# Patient Record
Sex: Female | Born: 1935 | Race: White | Hispanic: No | Smoking: Never smoker
Health system: Southern US, Community
[De-identification: ages and names within clinical notes are randomized; demographics above are authoritative.]

## PROBLEM LIST (undated history)

## (undated) DIAGNOSIS — D649 Anemia, unspecified: Secondary | ICD-10-CM

## (undated) DIAGNOSIS — K589 Irritable bowel syndrome without diarrhea: Secondary | ICD-10-CM

## (undated) DIAGNOSIS — F411 Generalized anxiety disorder: Secondary | ICD-10-CM

## (undated) DIAGNOSIS — T4145XA Adverse effect of unspecified anesthetic, initial encounter: Secondary | ICD-10-CM

## (undated) DIAGNOSIS — M719 Bursopathy, unspecified: Secondary | ICD-10-CM

## (undated) DIAGNOSIS — M1990 Unspecified osteoarthritis, unspecified site: Secondary | ICD-10-CM

## (undated) DIAGNOSIS — C4492 Squamous cell carcinoma of skin, unspecified: Secondary | ICD-10-CM

## (undated) DIAGNOSIS — I739 Peripheral vascular disease, unspecified: Secondary | ICD-10-CM

## (undated) DIAGNOSIS — B0229 Other postherpetic nervous system involvement: Secondary | ICD-10-CM

## (undated) DIAGNOSIS — E785 Hyperlipidemia, unspecified: Secondary | ICD-10-CM

## (undated) DIAGNOSIS — I359 Nonrheumatic aortic valve disorder, unspecified: Secondary | ICD-10-CM

## (undated) DIAGNOSIS — K5730 Diverticulosis of large intestine without perforation or abscess without bleeding: Secondary | ICD-10-CM

## (undated) DIAGNOSIS — E119 Type 2 diabetes mellitus without complications: Secondary | ICD-10-CM

## (undated) DIAGNOSIS — E039 Hypothyroidism, unspecified: Secondary | ICD-10-CM

## (undated) DIAGNOSIS — R001 Bradycardia, unspecified: Secondary | ICD-10-CM

## (undated) DIAGNOSIS — G459 Transient cerebral ischemic attack, unspecified: Secondary | ICD-10-CM

## (undated) DIAGNOSIS — T8859XA Other complications of anesthesia, initial encounter: Secondary | ICD-10-CM

## (undated) DIAGNOSIS — Z780 Asymptomatic menopausal state: Secondary | ICD-10-CM

## (undated) DIAGNOSIS — I8390 Asymptomatic varicose veins of unspecified lower extremity: Secondary | ICD-10-CM

## (undated) DIAGNOSIS — I10 Essential (primary) hypertension: Secondary | ICD-10-CM

## (undated) DIAGNOSIS — H9190 Unspecified hearing loss, unspecified ear: Secondary | ICD-10-CM

## (undated) HISTORY — DX: Essential (primary) hypertension: I10

## (undated) HISTORY — DX: Unspecified osteoarthritis, unspecified site: M19.90

## (undated) HISTORY — DX: Unspecified hearing loss, unspecified ear: H91.90

## (undated) HISTORY — DX: Other postherpetic nervous system involvement: B02.29

## (undated) HISTORY — PX: TONSILLECTOMY: SUR1361

## (undated) HISTORY — DX: Hyperlipidemia, unspecified: E78.5

## (undated) HISTORY — DX: Anemia, unspecified: D64.9

## (undated) HISTORY — DX: Irritable bowel syndrome, unspecified: K58.9

## (undated) HISTORY — PX: CATARACT EXTRACTION, BILATERAL: SHX1313

## (undated) HISTORY — DX: Hypothyroidism, unspecified: E03.9

## (undated) HISTORY — DX: Diverticulosis of large intestine without perforation or abscess without bleeding: K57.30

## (undated) HISTORY — PX: PACEMAKER INSERTION: SHX728

## (undated) HISTORY — DX: Nonrheumatic aortic valve disorder, unspecified: I35.9

## (undated) HISTORY — DX: Bradycardia, unspecified: R00.1

## (undated) HISTORY — PX: VAGINAL HYSTERECTOMY: SUR661

## (undated) HISTORY — PX: APPENDECTOMY: SHX54

## (undated) HISTORY — DX: Generalized anxiety disorder: F41.1

## (undated) HISTORY — PX: CHOLECYSTECTOMY: SHX55

## (undated) HISTORY — PX: DILATION AND CURETTAGE OF UTERUS: SHX78

---
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
## 1997-08-08 ENCOUNTER — Other Ambulatory Visit: Admission: RE | Admit: 1997-08-08 | Discharge: 1997-08-08 | Payer: Self-pay | Admitting: Obstetrics and Gynecology

## 1998-09-20 ENCOUNTER — Other Ambulatory Visit: Admission: RE | Admit: 1998-09-20 | Discharge: 1998-09-20 | Payer: Self-pay | Admitting: Obstetrics and Gynecology

## 1999-05-06 ENCOUNTER — Encounter: Payer: Self-pay | Admitting: Pulmonary Disease

## 1999-05-06 ENCOUNTER — Encounter: Admission: RE | Admit: 1999-05-06 | Discharge: 1999-05-06 | Payer: Self-pay | Admitting: Pulmonary Disease

## 1999-06-25 ENCOUNTER — Encounter: Payer: Self-pay | Admitting: Emergency Medicine

## 1999-06-25 ENCOUNTER — Emergency Department (HOSPITAL_COMMUNITY): Admission: EM | Admit: 1999-06-25 | Discharge: 1999-06-25 | Payer: Self-pay | Admitting: Emergency Medicine

## 1999-09-24 ENCOUNTER — Other Ambulatory Visit: Admission: RE | Admit: 1999-09-24 | Discharge: 1999-09-24 | Payer: Self-pay | Admitting: Obstetrics and Gynecology

## 2000-01-29 ENCOUNTER — Emergency Department (HOSPITAL_COMMUNITY): Admission: EM | Admit: 2000-01-29 | Discharge: 2000-01-29 | Payer: Self-pay | Admitting: Emergency Medicine

## 2000-04-28 ENCOUNTER — Encounter: Payer: Self-pay | Admitting: Neurology

## 2000-04-28 ENCOUNTER — Ambulatory Visit (HOSPITAL_COMMUNITY): Admission: RE | Admit: 2000-04-28 | Discharge: 2000-04-28 | Payer: Self-pay | Admitting: Neurology

## 2000-05-06 ENCOUNTER — Encounter: Admission: RE | Admit: 2000-05-06 | Discharge: 2000-06-04 | Payer: Self-pay | Admitting: Neurology

## 2000-06-15 ENCOUNTER — Ambulatory Visit (HOSPITAL_COMMUNITY): Admission: RE | Admit: 2000-06-15 | Discharge: 2000-06-15 | Payer: Self-pay | Admitting: Pulmonary Disease

## 2000-06-15 ENCOUNTER — Encounter: Payer: Self-pay | Admitting: Pulmonary Disease

## 2001-03-04 ENCOUNTER — Other Ambulatory Visit: Admission: RE | Admit: 2001-03-04 | Discharge: 2001-03-04 | Payer: Self-pay | Admitting: Obstetrics and Gynecology

## 2001-05-11 ENCOUNTER — Encounter: Payer: Self-pay | Admitting: Pulmonary Disease

## 2001-05-11 ENCOUNTER — Encounter: Admission: RE | Admit: 2001-05-11 | Discharge: 2001-05-11 | Payer: Self-pay | Admitting: Pulmonary Disease

## 2003-04-02 ENCOUNTER — Emergency Department (HOSPITAL_COMMUNITY): Admission: EM | Admit: 2003-04-02 | Discharge: 2003-04-02 | Payer: Self-pay | Admitting: Internal Medicine

## 2004-04-09 ENCOUNTER — Ambulatory Visit: Payer: Self-pay | Admitting: Pulmonary Disease

## 2004-04-23 ENCOUNTER — Ambulatory Visit: Payer: Self-pay | Admitting: Pulmonary Disease

## 2004-10-01 ENCOUNTER — Emergency Department (HOSPITAL_COMMUNITY): Admission: EM | Admit: 2004-10-01 | Discharge: 2004-10-01 | Payer: Self-pay | Admitting: Emergency Medicine

## 2005-01-20 ENCOUNTER — Ambulatory Visit: Payer: Self-pay | Admitting: Pulmonary Disease

## 2005-01-21 ENCOUNTER — Ambulatory Visit: Payer: Self-pay | Admitting: Pulmonary Disease

## 2005-02-05 ENCOUNTER — Ambulatory Visit: Payer: Self-pay

## 2005-03-04 ENCOUNTER — Ambulatory Visit: Payer: Self-pay | Admitting: Pulmonary Disease

## 2005-07-02 ENCOUNTER — Ambulatory Visit: Payer: Self-pay | Admitting: Pulmonary Disease

## 2005-07-08 ENCOUNTER — Ambulatory Visit: Payer: Self-pay | Admitting: Internal Medicine

## 2005-10-09 ENCOUNTER — Ambulatory Visit: Payer: Self-pay | Admitting: Pulmonary Disease

## 2005-10-10 ENCOUNTER — Ambulatory Visit: Payer: Self-pay | Admitting: Cardiology

## 2005-12-26 ENCOUNTER — Ambulatory Visit: Payer: Self-pay | Admitting: Pulmonary Disease

## 2006-02-13 ENCOUNTER — Ambulatory Visit: Payer: Self-pay | Admitting: Pulmonary Disease

## 2006-06-24 ENCOUNTER — Encounter: Admission: RE | Admit: 2006-06-24 | Discharge: 2006-06-24 | Payer: Self-pay | Admitting: Pulmonary Disease

## 2006-08-06 ENCOUNTER — Ambulatory Visit: Payer: Self-pay | Admitting: Pulmonary Disease

## 2006-08-06 LAB — CONVERTED CEMR LAB
ALT: 17 units/L (ref 0–40)
AST: 18 units/L (ref 0–37)
Albumin: 3.8 g/dL (ref 3.5–5.2)
Alkaline Phosphatase: 66 units/L (ref 39–117)
Basophils Absolute: 0.1 10*3/uL (ref 0.0–0.1)
Calcium: 9.3 mg/dL (ref 8.4–10.5)
Chloride: 106 meq/L (ref 96–112)
Cholesterol: 171 mg/dL (ref 0–200)
Creatinine, Ser: 0.9 mg/dL (ref 0.4–1.2)
Eosinophils Absolute: 0.3 10*3/uL (ref 0.0–0.6)
GFR calc non Af Amer: 64 mL/min
HCT: 34.1 % — ABNORMAL LOW (ref 36.0–46.0)
LDL Cholesterol: 96 mg/dL (ref 0–99)
MCHC: 33.7 g/dL (ref 30.0–36.0)
MCV: 93.2 fL (ref 78.0–100.0)
Neutrophils Relative %: 41.1 % — ABNORMAL LOW (ref 43.0–77.0)
Platelets: 206 10*3/uL (ref 150–400)
RBC: 3.66 M/uL — ABNORMAL LOW (ref 3.87–5.11)
RDW: 12.3 % (ref 11.5–14.6)
Total Bilirubin: 0.8 mg/dL (ref 0.3–1.2)
Total CHOL/HDL Ratio: 3
Triglycerides: 88 mg/dL (ref 0–149)
WBC: 5.5 10*3/uL (ref 4.5–10.5)

## 2007-01-01 ENCOUNTER — Ambulatory Visit: Payer: Self-pay | Admitting: Pulmonary Disease

## 2007-06-08 ENCOUNTER — Ambulatory Visit: Payer: Self-pay | Admitting: Pulmonary Disease

## 2007-06-08 DIAGNOSIS — H9190 Unspecified hearing loss, unspecified ear: Secondary | ICD-10-CM | POA: Insufficient documentation

## 2007-06-08 DIAGNOSIS — F411 Generalized anxiety disorder: Secondary | ICD-10-CM | POA: Insufficient documentation

## 2007-06-08 DIAGNOSIS — M810 Age-related osteoporosis without current pathological fracture: Secondary | ICD-10-CM | POA: Insufficient documentation

## 2007-06-08 DIAGNOSIS — G459 Transient cerebral ischemic attack, unspecified: Secondary | ICD-10-CM | POA: Insufficient documentation

## 2007-06-08 DIAGNOSIS — J42 Unspecified chronic bronchitis: Secondary | ICD-10-CM | POA: Insufficient documentation

## 2007-06-08 DIAGNOSIS — G609 Hereditary and idiopathic neuropathy, unspecified: Secondary | ICD-10-CM | POA: Insufficient documentation

## 2007-06-08 DIAGNOSIS — K5730 Diverticulosis of large intestine without perforation or abscess without bleeding: Secondary | ICD-10-CM | POA: Insufficient documentation

## 2007-06-08 DIAGNOSIS — I10 Essential (primary) hypertension: Secondary | ICD-10-CM | POA: Insufficient documentation

## 2007-06-08 DIAGNOSIS — E039 Hypothyroidism, unspecified: Secondary | ICD-10-CM | POA: Insufficient documentation

## 2007-06-08 DIAGNOSIS — E785 Hyperlipidemia, unspecified: Secondary | ICD-10-CM | POA: Insufficient documentation

## 2007-06-11 LAB — CONVERTED CEMR LAB
Alkaline Phosphatase: 58 units/L (ref 39–117)
BUN: 25 mg/dL — ABNORMAL HIGH (ref 6–23)
Basophils Absolute: 0.1 10*3/uL (ref 0.0–0.1)
Basophils Relative: 1.4 % — ABNORMAL HIGH (ref 0.0–1.0)
CO2: 31 meq/L (ref 19–32)
Eosinophils Absolute: 0.6 10*3/uL (ref 0.0–0.6)
GFR calc Af Amer: 68 mL/min
GFR calc non Af Amer: 56 mL/min
Hemoglobin: 11.3 g/dL — ABNORMAL LOW (ref 12.0–15.0)
Hgb A1c MFr Bld: 6.7 % — ABNORMAL HIGH (ref 4.6–6.0)
Lymphocytes Relative: 33.6 % (ref 12.0–46.0)
MCHC: 32.8 g/dL (ref 30.0–36.0)
MCV: 93.3 fL (ref 78.0–100.0)
Monocytes Absolute: 0.5 10*3/uL (ref 0.2–0.7)
Monocytes Relative: 7.6 % (ref 3.0–11.0)
Neutro Abs: 3.1 10*3/uL (ref 1.4–7.7)
Platelets: 201 10*3/uL (ref 150–400)
Potassium: 3.7 meq/L (ref 3.5–5.1)

## 2007-06-13 ENCOUNTER — Emergency Department (HOSPITAL_COMMUNITY): Admission: EM | Admit: 2007-06-13 | Discharge: 2007-06-13 | Payer: Self-pay | Admitting: Emergency Medicine

## 2007-06-16 ENCOUNTER — Ambulatory Visit: Payer: Self-pay | Admitting: Pulmonary Disease

## 2007-06-16 DIAGNOSIS — B029 Zoster without complications: Secondary | ICD-10-CM | POA: Insufficient documentation

## 2007-06-18 ENCOUNTER — Telehealth (INDEPENDENT_AMBULATORY_CARE_PROVIDER_SITE_OTHER): Payer: Self-pay | Admitting: *Deleted

## 2007-06-30 ENCOUNTER — Ambulatory Visit: Payer: Self-pay | Admitting: Pulmonary Disease

## 2007-07-01 ENCOUNTER — Telehealth (INDEPENDENT_AMBULATORY_CARE_PROVIDER_SITE_OTHER): Payer: Self-pay | Admitting: *Deleted

## 2007-07-01 DIAGNOSIS — B0229 Other postherpetic nervous system involvement: Secondary | ICD-10-CM | POA: Insufficient documentation

## 2007-07-08 ENCOUNTER — Ambulatory Visit: Payer: Self-pay | Admitting: Pulmonary Disease

## 2007-07-08 DIAGNOSIS — K2990 Gastroduodenitis, unspecified, without bleeding: Secondary | ICD-10-CM

## 2007-07-08 DIAGNOSIS — K2970 Gastritis, unspecified, without bleeding: Secondary | ICD-10-CM | POA: Insufficient documentation

## 2007-08-10 ENCOUNTER — Ambulatory Visit: Payer: Self-pay | Admitting: Internal Medicine

## 2007-08-16 ENCOUNTER — Encounter: Payer: Self-pay | Admitting: Adult Health

## 2007-09-08 ENCOUNTER — Encounter: Payer: Self-pay | Admitting: Adult Health

## 2007-09-08 ENCOUNTER — Ambulatory Visit: Payer: Self-pay | Admitting: Internal Medicine

## 2007-09-17 ENCOUNTER — Telehealth: Payer: Self-pay | Admitting: Adult Health

## 2007-10-06 ENCOUNTER — Ambulatory Visit: Payer: Self-pay | Admitting: Pulmonary Disease

## 2007-10-07 ENCOUNTER — Ambulatory Visit: Payer: Self-pay | Admitting: Pulmonary Disease

## 2007-10-20 ENCOUNTER — Telehealth: Payer: Self-pay | Admitting: Pulmonary Disease

## 2007-11-03 ENCOUNTER — Telehealth: Payer: Self-pay | Admitting: Pulmonary Disease

## 2007-11-05 LAB — CONVERTED CEMR LAB
Calcium: 9.5 mg/dL (ref 8.4–10.5)
Direct LDL: 143 mg/dL
GFR calc Af Amer: 77 mL/min
GFR calc non Af Amer: 64 mL/min
Glucose, Bld: 97 mg/dL (ref 70–99)
HDL: 57.8 mg/dL (ref >39.0)
Hgb A1c MFr Bld: 6.6 % — ABNORMAL HIGH (ref 4.6–6.0)
Potassium: 4.5 meq/L (ref 3.5–5.1)
Sodium: 137 meq/L (ref 135–145)
TSH: 6.2 microintl units/mL — ABNORMAL HIGH (ref 0.35–5.50)
Total CHOL/HDL Ratio: 3.8

## 2007-11-06 DIAGNOSIS — R7309 Other abnormal glucose: Secondary | ICD-10-CM | POA: Insufficient documentation

## 2007-11-29 ENCOUNTER — Encounter: Payer: Self-pay | Admitting: Pulmonary Disease

## 2007-12-28 ENCOUNTER — Ambulatory Visit: Payer: Self-pay | Admitting: Internal Medicine

## 2007-12-28 DIAGNOSIS — K589 Irritable bowel syndrome without diarrhea: Secondary | ICD-10-CM | POA: Insufficient documentation

## 2007-12-29 ENCOUNTER — Encounter: Payer: Self-pay | Admitting: Adult Health

## 2007-12-29 ENCOUNTER — Encounter: Payer: Self-pay | Admitting: Pulmonary Disease

## 2007-12-31 LAB — CONVERTED CEMR LAB
ALT: 17 units/L (ref 0–35)
Basophils Absolute: 0.1 10*3/uL (ref 0.0–0.1)
Bilirubin Urine: NEGATIVE
Bilirubin, Direct: 0.1 mg/dL (ref 0.0–0.3)
CO2: 31 meq/L (ref 19–32)
Calcium: 9.6 mg/dL (ref 8.4–10.5)
Chloride: 101 meq/L (ref 96–112)
Creatinine, Ser: 0.8 mg/dL (ref 0.4–1.2)
GFR calc non Af Amer: 73 mL/min
Hemoglobin: 11.8 g/dL — ABNORMAL LOW (ref 12.0–15.0)
Lymphocytes Relative: 36 % (ref 12.0–46.0)
MCHC: 34.2 g/dL (ref 30.0–36.0)
Monocytes Relative: 7.4 % (ref 3.0–12.0)
Mucus, UA: NEGATIVE
Neutrophils Relative %: 45 % (ref 43.0–77.0)
Nitrite: NEGATIVE
Platelets: 222 10*3/uL (ref 150–400)
RDW: 12.2 % (ref 11.5–14.6)
Sodium: 136 meq/L (ref 135–145)
Specific Gravity, Urine: 1.01 (ref 1.000–1.03)
Squamous Epithelial / LPF: NEGATIVE /lpf
Total Bilirubin: 0.8 mg/dL (ref 0.3–1.2)
Total Protein, Urine: NEGATIVE mg/dL
pH: 7 (ref 5.0–8.0)

## 2008-01-11 ENCOUNTER — Ambulatory Visit: Payer: Self-pay | Admitting: Pulmonary Disease

## 2008-01-14 ENCOUNTER — Ambulatory Visit: Payer: Self-pay | Admitting: Internal Medicine

## 2008-01-14 LAB — CONVERTED CEMR LAB
Mucus, UA: NEGATIVE
Nitrite: NEGATIVE
RBC / HPF: NONE SEEN
Total Protein, Urine: NEGATIVE mg/dL
Urine Glucose: NEGATIVE mg/dL
WBC, UA: NONE SEEN cells/hpf
pH: 6 (ref 5.0–8.0)

## 2008-01-15 ENCOUNTER — Encounter: Payer: Self-pay | Admitting: Adult Health

## 2008-02-08 ENCOUNTER — Ambulatory Visit: Payer: Self-pay | Admitting: Pulmonary Disease

## 2008-02-19 DIAGNOSIS — M1990 Unspecified osteoarthritis, unspecified site: Secondary | ICD-10-CM | POA: Insufficient documentation

## 2008-02-19 DIAGNOSIS — Z9189 Other specified personal risk factors, not elsewhere classified: Secondary | ICD-10-CM | POA: Insufficient documentation

## 2008-03-15 ENCOUNTER — Telehealth (INDEPENDENT_AMBULATORY_CARE_PROVIDER_SITE_OTHER): Payer: Self-pay | Admitting: *Deleted

## 2008-04-12 ENCOUNTER — Ambulatory Visit: Payer: Self-pay | Admitting: Adult Health

## 2008-04-13 ENCOUNTER — Telehealth (INDEPENDENT_AMBULATORY_CARE_PROVIDER_SITE_OTHER): Payer: Self-pay | Admitting: *Deleted

## 2008-04-17 LAB — CONVERTED CEMR LAB
ALT: 13 units/L (ref 0–35)
Albumin: 3.6 g/dL (ref 3.5–5.2)
Basophils Relative: 1.5 % (ref 0.0–3.0)
Bilirubin, Direct: 0.1 mg/dL (ref 0.0–0.3)
Eosinophils Relative: 4.2 % (ref 0.0–5.0)
HCT: 34 % — ABNORMAL LOW (ref 36.0–46.0)
Hemoglobin: 11.4 g/dL — ABNORMAL LOW (ref 12.0–15.0)
MCV: 94.6 fL (ref 78.0–100.0)
Monocytes Absolute: 0.6 10*3/uL (ref 0.1–1.0)
Monocytes Relative: 12.3 % — ABNORMAL HIGH (ref 3.0–12.0)
Neutro Abs: 2.7 10*3/uL (ref 1.4–7.7)
Platelets: 187 10*3/uL (ref 150–400)
RBC: 3.59 M/uL — ABNORMAL LOW (ref 3.87–5.11)
Sed Rate: 22 mm/hr (ref 0–22)
Total Protein: 6.5 g/dL (ref 6.0–8.3)
WBC: 4.8 10*3/uL (ref 4.5–10.5)

## 2008-04-25 ENCOUNTER — Ambulatory Visit: Payer: Self-pay | Admitting: Pulmonary Disease

## 2008-04-25 ENCOUNTER — Ambulatory Visit: Payer: Self-pay | Admitting: Internal Medicine

## 2008-06-21 ENCOUNTER — Ambulatory Visit: Payer: Self-pay | Admitting: Pulmonary Disease

## 2008-06-21 DIAGNOSIS — D649 Anemia, unspecified: Secondary | ICD-10-CM | POA: Insufficient documentation

## 2008-06-24 LAB — CONVERTED CEMR LAB
Alkaline Phosphatase: 71 units/L (ref 39–117)
Bilirubin, Direct: 0.1 mg/dL (ref 0.0–0.3)
Eosinophils Absolute: 0.4 10*3/uL (ref 0.0–0.7)
GFR calc Af Amer: 123 mL/min
GFR calc non Af Amer: 101 mL/min
Glucose, Bld: 129 mg/dL — ABNORMAL HIGH (ref 70–99)
HCT: 33.6 % — ABNORMAL LOW (ref 36.0–46.0)
Hgb A1c MFr Bld: 6.6 % — ABNORMAL HIGH (ref 4.6–6.0)
Monocytes Absolute: 0.4 10*3/uL (ref 0.1–1.0)
Monocytes Relative: 7.4 % (ref 3.0–12.0)
Neutrophils Relative %: 45 % (ref 43.0–77.0)
Platelets: 198 10*3/uL (ref 150–400)
Potassium: 4.3 meq/L (ref 3.5–5.1)
RDW: 12.1 % (ref 11.5–14.6)
Saturation Ratios: 20.3 % (ref 20.0–50.0)
Sodium: 137 meq/L (ref 135–145)
TSH: 0.41 microintl units/mL (ref 0.35–5.50)

## 2008-07-10 ENCOUNTER — Telehealth (INDEPENDENT_AMBULATORY_CARE_PROVIDER_SITE_OTHER): Payer: Self-pay | Admitting: *Deleted

## 2008-08-24 ENCOUNTER — Encounter: Payer: Self-pay | Admitting: Adult Health

## 2008-08-24 ENCOUNTER — Ambulatory Visit: Payer: Self-pay | Admitting: Pulmonary Disease

## 2008-08-24 DIAGNOSIS — M549 Dorsalgia, unspecified: Secondary | ICD-10-CM | POA: Insufficient documentation

## 2008-08-24 LAB — CONVERTED CEMR LAB
Hemoglobin, Urine: NEGATIVE
Nitrite: POSITIVE
Rhuematoid fact SerPl-aCnc: <20 intl units/mL (ref 0.0–20.0)
Urobilinogen, UA: 0.2 (ref 0.0–1.0)

## 2008-08-25 ENCOUNTER — Encounter: Payer: Self-pay | Admitting: Adult Health

## 2008-08-25 ENCOUNTER — Telehealth: Payer: Self-pay | Admitting: Adult Health

## 2008-09-19 ENCOUNTER — Encounter: Payer: Self-pay | Admitting: Pulmonary Disease

## 2008-09-27 ENCOUNTER — Telehealth (INDEPENDENT_AMBULATORY_CARE_PROVIDER_SITE_OTHER): Payer: Self-pay | Admitting: *Deleted

## 2008-09-27 ENCOUNTER — Encounter
Admission: RE | Admit: 2008-09-27 | Discharge: 2008-09-27 | Payer: Self-pay | Admitting: Physical Medicine and Rehabilitation

## 2008-09-27 ENCOUNTER — Ambulatory Visit: Payer: Self-pay | Admitting: Pulmonary Disease

## 2008-09-27 LAB — CONVERTED CEMR LAB
Hemoglobin, Urine: NEGATIVE
Nitrite: NEGATIVE
Total Protein, Urine: NEGATIVE mg/dL
pH: 5.5 (ref 5.0–8.0)

## 2008-10-03 ENCOUNTER — Encounter: Payer: Self-pay | Admitting: Pulmonary Disease

## 2008-10-04 ENCOUNTER — Telehealth (INDEPENDENT_AMBULATORY_CARE_PROVIDER_SITE_OTHER): Payer: Self-pay | Admitting: *Deleted

## 2008-11-01 ENCOUNTER — Encounter: Payer: Self-pay | Admitting: Pulmonary Disease

## 2008-11-28 ENCOUNTER — Encounter: Payer: Self-pay | Admitting: Pulmonary Disease

## 2008-11-29 ENCOUNTER — Ambulatory Visit: Payer: Self-pay | Admitting: Pulmonary Disease

## 2008-11-29 LAB — CONVERTED CEMR LAB
Bilirubin Urine: NEGATIVE
Ketones, ur: NEGATIVE mg/dL
Nitrite: POSITIVE
Total Protein, Urine: NEGATIVE mg/dL

## 2008-12-21 ENCOUNTER — Telehealth: Payer: Self-pay | Admitting: Pulmonary Disease

## 2009-01-04 ENCOUNTER — Encounter: Payer: Self-pay | Admitting: Adult Health

## 2009-01-04 ENCOUNTER — Ambulatory Visit: Payer: Self-pay | Admitting: Internal Medicine

## 2009-01-05 LAB — CONVERTED CEMR LAB
ALT: 17 units/L (ref 0–35)
Albumin: 3.9 g/dL (ref 3.5–5.2)
Basophils Relative: 1.7 % (ref 0.0–3.0)
CO2: 31 meq/L (ref 19–32)
Chloride: 98 meq/L (ref 96–112)
Creatinine, Ser: 0.8 mg/dL (ref 0.4–1.2)
Eosinophils Absolute: 0.2 10*3/uL (ref 0.0–0.7)
Eosinophils Relative: 3.5 % (ref 0.0–5.0)
HCT: 34.4 % — ABNORMAL LOW (ref 36.0–46.0)
Hemoglobin: 11.7 g/dL — ABNORMAL LOW (ref 12.0–15.0)
Ketones, ur: NEGATIVE mg/dL
Lymphs Abs: 1.8 10*3/uL (ref 0.7–4.0)
MCHC: 34 g/dL (ref 30.0–36.0)
MCV: 92.8 fL (ref 78.0–100.0)
Monocytes Absolute: 0.5 10*3/uL (ref 0.1–1.0)
Neutro Abs: 3.5 10*3/uL (ref 1.4–7.7)
Potassium: 4.5 meq/L (ref 3.5–5.1)
RBC: 3.71 M/uL — ABNORMAL LOW (ref 3.87–5.11)
Sodium: 135 meq/L (ref 135–145)
Specific Gravity, Urine: <=1.005 (ref 1.000–1.030)
Total Protein: 6.6 g/dL (ref 6.0–8.3)
Urobilinogen, UA: 0.2 (ref 0.0–1.0)

## 2009-01-11 ENCOUNTER — Telehealth: Payer: Self-pay | Admitting: Adult Health

## 2009-02-01 ENCOUNTER — Ambulatory Visit: Payer: Self-pay | Admitting: Pulmonary Disease

## 2009-02-01 DIAGNOSIS — M79609 Pain in unspecified limb: Secondary | ICD-10-CM | POA: Insufficient documentation

## 2009-02-06 ENCOUNTER — Encounter: Payer: Self-pay | Admitting: Pulmonary Disease

## 2009-02-20 ENCOUNTER — Encounter: Payer: Self-pay | Admitting: Pulmonary Disease

## 2009-04-20 ENCOUNTER — Telehealth: Payer: Self-pay | Admitting: Pulmonary Disease

## 2009-06-08 ENCOUNTER — Ambulatory Visit: Payer: Self-pay | Admitting: Pulmonary Disease

## 2009-06-12 DIAGNOSIS — R252 Cramp and spasm: Secondary | ICD-10-CM | POA: Insufficient documentation

## 2009-06-12 LAB — CONVERTED CEMR LAB
BUN: 20 mg/dL (ref 6–23)
CO2: 29 meq/L (ref 19–32)
GFR calc non Af Amer: 84.63 mL/min (ref >60)
Glucose, Bld: 93 mg/dL (ref 70–99)
Hgb A1c MFr Bld: 6.3 % (ref 4.6–6.5)
Potassium: 4.8 meq/L (ref 3.5–5.1)

## 2009-06-28 ENCOUNTER — Ambulatory Visit: Payer: Self-pay | Admitting: Pulmonary Disease

## 2009-06-28 LAB — CONVERTED CEMR LAB
CO2: 31 meq/L (ref 19–32)
Calcium: 8.8 mg/dL (ref 8.4–10.5)
Chloride: 101 meq/L (ref 96–112)
Glucose, Bld: 141 mg/dL — ABNORMAL HIGH (ref 70–99)
Sodium: 138 meq/L (ref 135–145)

## 2009-07-01 DIAGNOSIS — E871 Hypo-osmolality and hyponatremia: Secondary | ICD-10-CM | POA: Insufficient documentation

## 2009-07-31 ENCOUNTER — Ambulatory Visit: Payer: Self-pay | Admitting: Pulmonary Disease

## 2009-07-31 DIAGNOSIS — I359 Nonrheumatic aortic valve disorder, unspecified: Secondary | ICD-10-CM | POA: Insufficient documentation

## 2009-07-31 DIAGNOSIS — I872 Venous insufficiency (chronic) (peripheral): Secondary | ICD-10-CM | POA: Insufficient documentation

## 2009-08-14 ENCOUNTER — Ambulatory Visit: Payer: Self-pay | Admitting: Pulmonary Disease

## 2009-08-19 LAB — CONVERTED CEMR LAB
Alkaline Phosphatase: 60 units/L (ref 39–117)
BUN: 16 mg/dL (ref 6–23)
Basophils Absolute: 0.1 10*3/uL (ref 0.0–0.1)
Basophils Relative: 1.5 % (ref 0.0–3.0)
Bilirubin, Direct: 0.1 mg/dL (ref 0.0–0.3)
CO2: 32 meq/L (ref 19–32)
Chloride: 105 meq/L (ref 96–112)
Cholesterol: 176 mg/dL (ref 0–200)
Creatinine, Ser: 0.7 mg/dL (ref 0.4–1.2)
Eosinophils Absolute: 0.3 10*3/uL (ref 0.0–0.7)
Glucose, Bld: 96 mg/dL (ref 70–99)
HCT: 33.3 % — ABNORMAL LOW (ref 36.0–46.0)
Hemoglobin: 11.4 g/dL — ABNORMAL LOW (ref 12.0–15.0)
LDL Cholesterol: 95 mg/dL (ref 0–99)
Lymphocytes Relative: 37.9 % (ref 12.0–46.0)
Lymphs Abs: 2.2 10*3/uL (ref 0.7–4.0)
MCHC: 34.4 g/dL (ref 30.0–36.0)
MCV: 94.8 fL (ref 78.0–100.0)
Neutro Abs: 2.8 10*3/uL (ref 1.4–7.7)
Potassium: 5.4 meq/L — ABNORMAL HIGH (ref 3.5–5.1)
RBC: 3.51 M/uL — ABNORMAL LOW (ref 3.87–5.11)
RDW: 13.6 % (ref 11.5–14.6)
Total Bilirubin: 0.6 mg/dL (ref 0.3–1.2)
Total CHOL/HDL Ratio: 3
Total Protein: 5.7 g/dL — ABNORMAL LOW (ref 6.0–8.3)
VLDL: 16.2 mg/dL (ref 0.0–40.0)

## 2009-08-21 ENCOUNTER — Ambulatory Visit (HOSPITAL_COMMUNITY): Admission: RE | Admit: 2009-08-21 | Discharge: 2009-08-21 | Payer: Self-pay | Admitting: Pulmonary Disease

## 2009-08-21 ENCOUNTER — Encounter: Payer: Self-pay | Admitting: Pulmonary Disease

## 2009-08-21 ENCOUNTER — Ambulatory Visit: Payer: Self-pay

## 2009-08-21 ENCOUNTER — Ambulatory Visit: Payer: Self-pay | Admitting: Cardiology

## 2009-08-24 ENCOUNTER — Telehealth (INDEPENDENT_AMBULATORY_CARE_PROVIDER_SITE_OTHER): Payer: Self-pay | Admitting: *Deleted

## 2009-09-04 ENCOUNTER — Encounter: Payer: Self-pay | Admitting: Pulmonary Disease

## 2009-10-01 ENCOUNTER — Telehealth: Payer: Self-pay | Admitting: Pulmonary Disease

## 2010-01-01 ENCOUNTER — Ambulatory Visit: Payer: Self-pay | Admitting: Internal Medicine

## 2010-01-01 ENCOUNTER — Encounter: Payer: Self-pay | Admitting: Adult Health

## 2010-01-07 ENCOUNTER — Telehealth (INDEPENDENT_AMBULATORY_CARE_PROVIDER_SITE_OTHER): Payer: Self-pay | Admitting: *Deleted

## 2010-01-29 ENCOUNTER — Ambulatory Visit: Payer: Self-pay | Admitting: Pulmonary Disease

## 2010-01-29 ENCOUNTER — Encounter: Payer: Self-pay | Admitting: Adult Health

## 2010-01-29 DIAGNOSIS — R300 Dysuria: Secondary | ICD-10-CM | POA: Insufficient documentation

## 2010-01-29 LAB — CONVERTED CEMR LAB
BUN: 14 mg/dL (ref 6–23)
CO2: 31 meq/L (ref 19–32)
Calcium: 9.4 mg/dL (ref 8.4–10.5)
Glucose, Bld: 95 mg/dL (ref 70–99)
Ketones, ur: NEGATIVE mg/dL
Potassium: 5.2 meq/L — ABNORMAL HIGH (ref 3.5–5.1)
Sodium: 132 meq/L — ABNORMAL LOW (ref 135–145)
Specific Gravity, Urine: 1.01 (ref 1.000–1.030)
Total Protein, Urine: NEGATIVE mg/dL
Urine Glucose: NEGATIVE mg/dL

## 2010-02-07 ENCOUNTER — Inpatient Hospital Stay (HOSPITAL_COMMUNITY): Admission: EM | Admit: 2010-02-07 | Discharge: 2010-02-12 | Payer: Self-pay | Admitting: Emergency Medicine

## 2010-02-07 ENCOUNTER — Telehealth: Payer: Self-pay | Admitting: Pulmonary Disease

## 2010-02-07 ENCOUNTER — Ambulatory Visit: Payer: Self-pay | Admitting: Cardiology

## 2010-02-11 ENCOUNTER — Ambulatory Visit: Payer: Self-pay | Admitting: Cardiology

## 2010-02-12 ENCOUNTER — Encounter: Payer: Self-pay | Admitting: Cardiology

## 2010-02-14 ENCOUNTER — Encounter: Payer: Self-pay | Admitting: Internal Medicine

## 2010-02-20 ENCOUNTER — Encounter: Payer: Self-pay | Admitting: Internal Medicine

## 2010-02-20 ENCOUNTER — Ambulatory Visit: Payer: Self-pay

## 2010-02-21 ENCOUNTER — Telehealth (INDEPENDENT_AMBULATORY_CARE_PROVIDER_SITE_OTHER): Payer: Self-pay | Admitting: *Deleted

## 2010-03-12 ENCOUNTER — Encounter: Payer: Self-pay | Admitting: Pulmonary Disease

## 2010-03-14 ENCOUNTER — Encounter: Payer: Self-pay | Admitting: Internal Medicine

## 2010-03-14 ENCOUNTER — Ambulatory Visit: Payer: Self-pay | Admitting: Cardiology

## 2010-03-14 ENCOUNTER — Encounter: Admission: RE | Admit: 2010-03-14 | Discharge: 2010-03-14 | Payer: Self-pay | Admitting: Cardiology

## 2010-04-17 ENCOUNTER — Telehealth: Payer: Self-pay | Admitting: Internal Medicine

## 2010-05-01 ENCOUNTER — Telehealth (INDEPENDENT_AMBULATORY_CARE_PROVIDER_SITE_OTHER): Payer: Self-pay | Admitting: *Deleted

## 2010-05-16 NOTE — Progress Notes (Signed)
Summary: checking on status of refills to [facility]  Phone Note Call from Patient Call back at [facility] Phone [phone]   Caller: Patient Call For: nadel Reason for Call: Refill Medication Summary of Call: Pt states that [facility] hasn't received refills for her lyrica and synthroid, pls advise. Initial call taken by: Darletta Moll,  January 07, 2010 10:54 AM  Follow-up for Phone Call        called and spoke with Randa Evens at [facility] and verified Lyrica and Synthroid were both shipped out 01/04/2010 and should arrive in pt's mailbox soon.  called and spoke with pt and informed her of the above information.  pt verbalized her understanding. nothing further needed.  Aundra Millet Reynolds LPN  January 07, 2010 4:28 PM

## 2010-05-16 NOTE — Progress Notes (Signed)
Summary: question about a pacer phone check  Phone Note Call from Patient Call back at Home Phone [phone]   Caller: Patient Summary of Call: Question about pacemaker Initial call taken by: Judie Grieve,  April 17, 2010 2:02 PM  Follow-up for Phone Call        spoke w/pt and pt was wondering about receiving carelink transmitter.  pt enrolled in carelink and transmitter to be sent in next 2-3 weeks.  pt aware of receiving transmitter and also ov w/ja on 06-06-10. Vella Kohler  April 18, 2010 8:42 AM

## 2010-05-16 NOTE — Progress Notes (Signed)
Summary: pt need to talk about buzzing noise  Phone Note Call from Patient Call back at Home Phone [phone]   Caller: Patient Reason for Call: Talk to Nurse, Talk to Doctor Summary of Call: pt wants to talk about buzzing she hears from pacemaker Initial call taken by: Omer Jack,  February 21, 2010 9:03 AM  Follow-up for Phone Call        Patient reassured about the noise  on her atrial lead.  We will follow [place] as scheduled. Follow-up by: Altha Harm, LPN,  February 22, 2010 10:25 AM

## 2010-05-16 NOTE — Progress Notes (Signed)
Summary: extremely dizzy  Phone Note Call from Patient Call back at Home Phone [phone]   Caller: Patient Call For: nadel Summary of Call: pt c/o dizziness- feels like she could pass out any minute. she is alone and wants to be seen. says her sone will be at her home at 3pm today. i advised pt that she needs to call 911 if she feels she can not wait for her son or a call back- NOT to wait, but it doesn't sound like she will call ems.  Initial call taken by: Tivis Ringer, CNA,  February 07, 2010 12:57 PM  Follow-up for Phone Call        attempted to call pt x 2 but the line was busy--will try back Randell Loop [facility]  February 07, 2010 1:07 PM     called and spoke with pt and she stated that EMS is there and that she is going to the ER for eval---stated that her BP was fine. Randell Loop CMA  February 07, 2010 1:14 PM

## 2010-05-16 NOTE — Procedures (Signed)
Summary: wound check.[email or web address]   Current Medications (verified): 1)  Aspirin 325 Mg Tabs (Aspirin) .... Take One By Mouth Once Daily 2)  Diovan Hct 80-12.5 Mg  Tabs (Valsartan-Hydrochlorothiazide) .... Take 1 Tablet By Mouth Once A Day 3)  Simvastatin 40 Mg Tabs (Simvastatin) .... Take 1 Tab By Mouth At Bedtime 4)  Fish Oil 1000 Mg Caps (Omega-3 Fatty Acids) .... Take 1 Tablet By Mouth Once A Day 5)  Coq10 100 Mg  Caps (Coenzyme Q10) .... Take 1 Capsule By Mouth Once A Day 6)  Synthroid 125 Mcg Tabs (Levothyroxine Sodium) .... Take 1 Tab By Mouth Once Daily.Marland KitchenMarland Kitchen 7)  Miralax  Powd (Polyethylene Glycol 3350) .... As Needed 8)  Stool Softener 100 Mg Caps (Docusate Sodium) .... Per Bottle 9)  Probiotic  Caps (Misc Intestinal Flora Regulat) .... Take 1 Tablet By Mouth Once A Day 10)  Alendronate Sodium 70 Mg Tabs (Alendronate Sodium) .... Take One Tablet By Mouth Once Weekly 11)  Caltrate 600+d 600-400 Mg-Unit  Tabs (Calcium Carbonate-Vitamin D) .[name].. 1 Tab By Mouth Once Daily 12)  Vitamin B-12 500 Mcg  Tabs (Cyanocobalamin) .... Take 1 Tablet By Mouth Once A Day 13)  Vitamin D 1000 Unit  Tabs (Cholecalciferol) .... Take 1 Tablet By Mouth Once A Day 14)  Alpha-Lipoic Acid 200 Mg Caps (Alpha-Lipoic Acid) .... Take 1 Tablet By Mouth Once A Day 15)  Onetouch Ultra Test  Strp (Glucose Blood) .... As Directed To Test Glucose  Allergies (verified): 1)  ! Penicillin 2)  ! Sulfa 3)  ! Xanax  PPM Specifications Following MD:  Hillis Range, MD     Referring MD:  Rolla Plate PPM Vendor:  [employer]     PPM Model Number:  [id number]     PPM Serial Number:  [id number] PPM DOI:  02/11/2010     PPM Implanting MD:  Rolla Plate  Lead 1    Location: RA     DOI: 02/11/2010     Model #: [id number]     Serial #: [id number]     Status: active Lead 2    Location: RV     DOI: 02/11/2010     Model #: 4470     Serial #: [id number]     Status: active  Magnet Response Rate:  BOL 85 ERI 65  Indications:  Syncope   PPM  Follow Up Remote Check?  No Battery Voltage:  2.79 V     Battery Est. Longevity:  12.5 years     Pacer Dependent:  No       PPM Device Measurements Atrium  Amplitude: 2.8 mV, Impedance: 558 ohms, Threshold: 0.5 V at 0.4 msec Right Ventricle  Amplitude: 11.2 mV, Impedance: 690 ohms, Threshold: 0.5 V at 0.4 msec  Episodes MS Episodes:  0     Percent Mode Switch:  0     Coumadin:  No Ventricular High Rate:  0     Atrial Pacing:  11.4%     Ventricular Pacing:  20.5%  Parameters Mode:  DDD     Lower Rate Limit:  60     Upper Rate Limit:  130 Paced AV Delay:  180     Sensed AV Delay:  150 Next Cardiology Appt Due:  05/15/2010 Tech Comments:  Steri strips removed, no redness, but resolving bruising noted into the right breast area.  There is noise noted on the atrial lead but her impedance, threshold and p-waves are stable, Dr. Johney Frame is aware.  ROV 3  months with Dr. Johney Frame.  Altha Harm, LPN  February 20, 2010 12:18 PM   Appended Document: wound check.mdt.amber Though there is significant artifact on the atrial lead, function appears to be stable.  Given advanced age, I would follow conservatively.  I have discussed with Dr Deborah Chalk who plans to see the patient in the next few weeks also.

## 2010-05-16 NOTE — Progress Notes (Signed)
Summary: test results  Phone Note Call from Patient Call back at Home Phone [phone]   Caller: Patient Call For: nadel Reason for Call: Lab or Test Results Summary of Call: Wants results of echo. Initial call taken by: Darletta Moll,  Aug 24, 2009 2:54 PM  Follow-up for Phone Call        pt requesting resutls of echo, append states results on phone tree, however when I checked it states results not available. Please adivse. [name] CMA  Aug 24, 2009 2:59 PM   Additional Follow-up for Phone Call Additional follow up Details #1::        called and spoke with pt---she is aware per SN about the results of her echo---no changes from the echo done from 2006 and this is really good---she got her lab results from the phonetree and had no questions about these results. Randell Loop CMA  Aug 24, 2009 3:14 PM

## 2010-05-16 NOTE — Cardiovascular Report (Signed)
Summary: Office Visit   Office Visit   Imported By: Roderic Ovens 03/04/2010 15:51:00  _____________________________________________________________________  External Attachment:    Type:   Image     Comment:   External Document

## 2010-05-16 NOTE — Miscellaneous (Signed)
Summary: BONE DENSITY  Clinical Lists Changes  Orders: Added new Test order of T-Bone Densitometry (77080) - Signed Added new Test order of T-Lumbar Vertebral Assessment (77082) - Signed 

## 2010-05-16 NOTE — Assessment & Plan Note (Signed)
Summary: cramps/ mbw   CC:  leg cramps at night for several weeks - keeps pt up at hs - walking around doesn't help much.  History of Present Illness: 77  y/o WF  with known history of HTN and hyperlipidemia.     ~  3/09 developed shingles involving right sacral dermatomes w/ rash and pain in the perineal and buttock areas... treated via ER & several visits w/ NP w/ Valtrex, Pred, Lyrica, Vicodin... lesions resolved, some persist post-herpetic neuralgia...   April 12, 2008 --Complains of 3 weeks pain in both legs  pt stopped simvastatin, lyrica and fosamax when leg sx started to see if one of these meds was the cause  . No change in pain wtih stopping meds.  REC; advil x 7d tramadol, restart lyrica  April 25, 2008--2 week follow up - states the heat helps, but the pain has returned since stopping the advil x7days; toe does not hurt as bad. Aches in both legs, right>left, hips worse with walking, pain down upper legs, pain in  lower legs some. No pain in back. Heat helps, advil helped- pain resolved. but started back when stopped advil. Pain with trying to get up out of bed and chair.   June 21, 2008:  she persisits w/ mult somatic complaints--- "my legs hurt in the winter" & " I over did Christmas"... she states she has tried off Simvastatin, Lyrica, Fosamax & others but nothing helps and her symptoms persist... XRays showed mild DJD both hips & lower spine;  & BMD 5/09 showed min osteopenia w/ TScores -0.0 in Spine & -1.4 in right FemNeck... she has tried Rx w/ Tramadol, Advil, Osteobiflex, heat, etc...   Aug 24, 2008    1. Complains of  lower back pain and B leg pain. Pt states she has difficulty sleeping at night and walking due to the pain.  Last visit, Lyrica was added at bedtime. Has not seen much improvement. Continues to cause her problems over last 6 months. She has a hx of chronic leg discomfort .. extensive work up in the past- DrStuckey 2001 w/ normal art dopplers; DrLove in 2001  w/ a neg NCV study;  DrAplington w/ meds and TENS trial (per her request- it helped her brother)...felt to be peripheral neuropathy. Also shingles Mar09 involving right sacraSeptember 23, 2010--Presents for work in visit. Complains of upper stomach pain over last 6 months, states is worse when she wakes up in the morning, and after eating stating that she "can't hardly stand it until she burps."  has been treating with tums and pepo-bismol. Worse over last few weeks. Bloated, gas. Burping really helps his symptoms. Denies chest pain, dyspnea, orthopnea, hemoptysis, fever, n/v/d, edema, headache,recent travel, bloody stools, weight loss. Does have intermittent consitpation.   06/08/09-Presents for work in visit. Complains of leg cramps at night for several weeks - keeps pt up at hs - walking around doesn't help much. Describes as cramp in lower leg that "draws " her leg. Happens mainly at night. Very sore after they occur. Denies known injury, chest pain, swelling, dyspnea.. Denies chest pain, dyspnea, orthopnea, hemoptysis, fever, n/v/d, edema, headache, recent travel.   Current Medications (verified): 1)  Adult Aspirin Ec Low Strength 81 Mg  Tbec (Aspirin) .... *** On Hold *** 2)  Diovan Hct 80-12.5 Mg  Tabs (Valsartan-Hydrochlorothiazide) .... Take 1 Tablet By Mouth Once A Day 3)  Simvastatin 40 Mg Tabs (Simvastatin) .... *** On Hold *** 4)  Fish Oil 1000 Mg Caps (  Omega-3 Fatty Acids) .... Take 1 Tablet By Mouth Once A Day 5)  Coq10 100 Mg  Caps (Coenzyme Q10) .... 2 Tablets Per Day 6)  Synthroid 125 Mcg Tabs (Levothyroxine Sodium) .... Take 1 Tab By Mouth Once Daily.Marland KitchenMarland Kitchen 7)  Miralax  Powd (Polyethylene Glycol 3350) .... As Needed 8)  Stool Softener 100 Mg Caps (Docusate Sodium) .... Per Bottle 9)  Probiotic  Caps (Misc Intestinal Flora Regulat) .... Take 1 Tablet By Mouth Once A Day 10)  Osteo Bi-Flex Adv Double St   Tabs (Misc Natural Products) .... Take 2 Tablet By Mouth Once A Day 11)  Alendronate  Sodium 70 Mg Tabs (Alendronate Sodium) .... Take One Tablet By Mouth Once Weekly 12)  Caltrate 600+d 600-400 Mg-Unit  Tabs (Calcium Carbonate-Vitamin D) .[name].. 1 Tab By Mouth Two Times A Day... 13)  Vitamin B-12 500 Mcg  Tabs (Cyanocobalamin) .... Take 1 Tablet By Mouth Once A Day 14)  Vitamin D 1000 Unit  Tabs (Cholecalciferol) .... Take 1 Tablet By Mouth Once A Day 15)  Geritol Complete  Tabs (Iron-Vitamins) .... Take 1 Tablet By Mouth Once A Day 16)  Alpha-Lipoic Acid 200 Mg Caps (Alpha-Lipoic Acid) .... Take 1 Tablet By Mouth Once A Day 17)  Lyrica 75 Mg  Caps (Pregabalin) .[name].. 1 By Mouth At Bedtime 18)  Onetouch Ultra Test  Strp (Glucose Blood) .... As Directed To Test Glucose  Allergies (verified): 1)  ! Penicillin 2)  ! Sulfa 3)  ! Xanax  Past History:  Past Medical History: Last updated: 02/01/2009 HEARING LOSS (ICD-389.9) BRONCHITIS, RECURRENT (ICD-491.9) HYPERTENSION (ICD-401.9) CHEST WALL PAIN, HX OF (ICD-V15.89) HYPERLIPIDEMIA (ICD-272.4) DIABETES MELLITUS, BORDERLINE (ICD-790.29) HYPOTHYROIDISM (ICD-244.9) GASTRITIS (ICD-535.50) DIVERTICULOSIS OF COLON (ICD-562.10) IRRITABLE BOWEL SYNDROME (ICD-564.1) DEGENERATIVE JOINT DISEASE (ICD-715.90) BACK PAIN (ICD-724.5) LEG PAIN, BILATERAL (ICD-729.5) OSTEOPOROSIS (ICD-733.00) Hx of TRANSIENT ISCHEMIC ATTACK (ICD-435.9) PERIPHERAL NEUROPATHY (ICD-356.9) HERPES ZOSTER (ICD-053.9) POSTHERPETIC NEURALGIA (ICD-053.19) ANXIETY (ICD-300.00) SHINGLES (ICD-053.9) ANEMIA, MILD (ICD-285.9)  Past Surgical History: Last updated: 02/01/2009 Appendectomy Cholecystectomy Hysterectomy Tonsillectomy  Family History: Last updated: 01/04/2009 heart disease - mother, father rheumatism - mother cancer - PGM (gallbladder) DM - father, sister, brother cerebral hemmorage - father  Social History: Last updated: 01/04/2009 never no alcohol 1-2 cups 1/2caffeine coffee daily lives alone widowed 4 children retired  [employer] operational officer  Risk Factors: Smoking Status: never (02/08/2008)  Review of Systems      See HPI  Vital Signs:  Patient profile:   77 year old female Weight:      154 pounds O2 Sat:      97 % on Room air Temp:     97.1 degrees F oral Pulse rate:   54 / minute BP sitting:   134 / 64  (left arm) Cuff size:   regular  Vitals Entered By: Abigail Miyamoto RN (June 08, 2009 11:10 AM)  O2 Flow:  Room air  Physical Exam  Additional Exam:  WD, WN, 77 y/o WF in NAD... GENERAL:  Alert & oriented; pleasant & cooperative... HEENT:  [place]/AT,   EACs-clear (she has bilat hearing aides), TMs-wnl, NOSE-clear, THROAT-clear & wnl. NECK:  Supple w/ fair ROM; no JVD; normal carotid impulses w/o bruits; no thyromegaly or nodules palpated; no lymphadenopathy. CHEST:  Clear to P & A; without wheezes/ rales/ or rhonchi. HEART:  Regular gr 1/6 SEM S4 without rubs... ABDOMEN:  Soft & nontender; normal bowel sounds; no organomegaly or masses detected.,  EXT: without deformities, mild arthritic changes; +varicose veins/ +venous insuffic/ no edema, neg homans  sign NEURO:  CN's intact; no focal neuro deficits x decr sensation in LE's c/w periph neuropathy.  DERM:  No lesions noted; no rash etc...   Impression & Recommendations:  Problem # 1:  LEG CRAMPS (ICD-729.82) Will check labs along w/ bmet and A1C Light stretching of legs two times a day  Apply heat for prior to going to bed to legs  I will call with lab results.  follow up 6 weeks Dr. Kriste Basque and as needed  Please contact office for sooner follow up if symptoms do not improve or worsen   Orders: Est. Patient Level IV (08657)  Complete Medication List: 1)  Adult Aspirin Ec Low Strength 81 Mg Tbec (Aspirin) .... *** on hold *** 2)  Diovan Hct 80-12.5 Mg Tabs (Valsartan-hydrochlorothiazide) .... Take 1 tablet by mouth once a day 3)  Simvastatin 40 Mg Tabs (Simvastatin) .... *** on hold *** 4)  Fish Oil 1000 Mg Caps (Omega-3 fatty acids) ....  Take 1 tablet by mouth once a day 5)  Coq10 100 Mg Caps (Coenzyme q10) .... 2 tablets per day 6)  Synthroid 125 Mcg Tabs (Levothyroxine sodium) .... Take 1 tab by mouth once daily.Marland KitchenMarland Kitchen 7)  Miralax Powd (Polyethylene glycol 3350) .... As needed 8)  Stool Softener 100 Mg Caps (Docusate sodium) .... Per bottle 9)  Probiotic Caps (Misc intestinal flora regulat) .... Take 1 tablet by mouth once a day 10)  Osteo Bi-flex Adv Double St Tabs (Misc natural products) .... Take 2 tablet by mouth once a day 11)  Alendronate Sodium 70 Mg Tabs (Alendronate sodium) .... Take one tablet by mouth once weekly 12)  Caltrate 600+d 600-400 Mg-unit Tabs (Calcium carbonate-vitamin d) .[name].. 1 tab by mouth two times a day... 13)  Vitamin B-12 500 Mcg Tabs (Cyanocobalamin) .... Take 1 tablet by mouth once a day 14)  Vitamin D 1000 Unit Tabs (Cholecalciferol) .... Take 1 tablet by mouth once a day 15)  Geritol Complete Tabs (Iron-vitamins) .... Take 1 tablet by mouth once a day 16)  Alpha-lipoic Acid 200 Mg Caps (Alpha-lipoic acid) .... Take 1 tablet by mouth once a day 17)  Lyrica 75 Mg Caps (Pregabalin) .[name].. 1 by mouth at bedtime 18)  Onetouch Ultra Test Strp (Glucose blood) .... As directed to test glucose  Other Orders: TLB-BMP (Basic Metabolic Panel-BMET) (80048-METABOL) TLB-B12 + Folate Pnl (82746_82607-B12/FOL) TLB-A1C / Hgb A1C (Glycohemoglobin) (83036-A1C)  Patient Instructions: 1)  Light stretching of legs two times a day  2)  Apply heat for prior to going to bed to legs  3)  I will call with lab results.  4)  follow up 6 weeks Dr. Kriste Basque and as needed  5)  Please contact office for sooner follow up if symptoms do not improve or worsen

## 2010-05-16 NOTE — Assessment & Plan Note (Signed)
Summary: rov 6 months///kp   CC:  6 month ROV & review of mult medical problems....  History of Present Illness: 77 y/o WF here for a follow up visit... she has mult med problems as listed below...   ~  June 21, 2008:  she persisits w/ mult somatic complaints--- "my legs hurt in the winter" & " I over did Christmas"... she states she has tried off Simvastatin, Lyrica, Fosamax & others but nothing helps and her symptoms persist... XRays showed mild DJD both hips & lower spine;  & BMD 5/09 showed min osteopenia w/ TScores -0.0 in Spine & -1.4 in right FemNeck... she has tried Rx w/ Tramadol, Advil, Osteobiflex, heat, etc...    ~  February 01, 2009:  eval by Ortho- DrRamos w/ LBP & bilat leg pain> showed multilevel DDD & spondylolithesis L5-S1... she's tried phys therapy, water exercises, meds like Tramadol, Advil, etc- no real benefit & she is sched for epid steroid shot soon... she's also stopped her Simvastatin (again) to see if this would help... BP has been controlled, BS has been stable on diet alone, otherw stable on meds.   ~  July 31, 2009:  she states that she feels OK overall, energy good (so she quit her iron Rx), but complains "my toes are stiff & red at night, not limber like my fingers"... she's had 2 shots in back from DrRamos w/ temp relief... she has mild AoV dis w/ thickened valve ?mild AS- needs f/u 2DEcho... BP controlled on DiovanHCT;  remains on Simva40+FishOil & due for FLP;  BS controlled on diet alone;  other problems as noted below...    Current Problem List:  HEARING LOSS (ICD-389.9) - she has bilat hearing aides.  BRONCHITIS, RECURRENT (ICD-491.9) - no recent resp tract infections...  HYPERTENSION (ICD-401.9) - controlled on DIOVAN/Hct 80-12.5 daily... BP=118/62 and tol med well... she denies HA, fatigue, visual changes, CP, palipit, dizziness, syncope, dyspnea, edema, etc...  CHEST WALL PAIN, HX OF (ICD-V15.89) & AORTIC VALVE DISORDERS (ICD-424.1)- s/p cardiac work  up in 2001... currently denies CP etc...  ~  NuclearStressTest 3/01 showed impaired exerc capacity & ST depression but no ischemia or infarction on images and EF=72%...  ~  2DEcho 10/06 showed no regional wall motion abn, EF= 70%, mild incr AV thickness w/ reduced excursion, mild AI, sl decr RV function.  ~  2DEcho 4/11 =   VENOUS INSUFFICIENCY (ICD-459.81) - she has mod VV, VI, & chr leg symptoms... good arterial pulses distally, no ischemic symptoms.  HYPERLIPIDEMIA (ICD-272.4) - she had been on SIMVASTATIN 40mg /d & she has stopped it on several occas due to leg discomfort to see if this would help- it has never lead to improved leg symptoms!... also takes FISH OIL daily.  ~  FLP 4/08 on Simvastatin 40mg /d showed TChol 171, TG 88, HDL 58, LDL 96...  ~  FLP 6/09 showed TChol 220, TG 98, HDL 58, LDL 143... rec to restart her Simvast40.  ~  10/10: not fasting at present & we discussed f/u FLP on ret in 2011  ~  FLP 4/11 showed =   DIABETES MELLITUS, BORDERLINE (ICD-790.29) - on diet alone...   ~  labs 4/08 w/ BS= 106...  ~  labs 2/09 w/ BS= 170, HgA1c= 6.7.Marland KitchenMarland Kitchen rec to start Metformin 500mg /d but she declined, prefer diet...  ~  labs 6/09 showed BS= 97, HgA1c= 6.6.Marland KitchenMarland Kitchen rec- continue diet Rx.  ~  labs 3/10 showed BS= 129, A1c= 6.6  ~  labs 9/10  showed BS= 108  ~  labs 4/11 showed =   HYPOTHYROIDISM (ICD-244.9) - now back on SYNTHROID 171mcg/d...  ~  TSH 4/08 = 1.45... keep same dose.  ~  TSH 2/09 = 0.17 on 165mcg/d dose... rec to decr to 155mcg/d...  ~  TSH 6/09 = 6.20 on 179mcg/d dose... she wants ret to dose.  ~  TSH 3/10 = 0.41 on 142mcg/d dose.  ~  TSH 4/11 =   DIVERTICULOSIS OF COLON (ICD-562.10) & CONSTIPATION (ICD-564.00) - we discussed a regimen to prevent constipation & she uses MIRALAX Prn...  ~  Flex Sig 6/96 by DrSam showed divertics, otherw normal left colon...  DEGENERATIVE JOINT DISEASE (ICD-715.90) - she take Glucosamine/ Chondroitin... tried Tramadol/ Advil etc without  improvement in leg pain...  OSTEOPOROSIS (ICD-733.00) - on ALENDRONATE 70mg /wk, calcium, vitamins...  ~  BMD here 5/09 showed TScores of 0.00 in Spine, and -1.4 in the right Fem Neck  Hx of TRANSIENT ISCHEMIC ATTACK (ICD-435.9) - she remains on ASA and has not had any new symptoms...  PERIPHERAL NEUROPATHY (ICD-356.9) - c/o chronic leg discomfort as noted... extensive work up in the past- DrStuckey 2001 w/ normal art dopplers; DrLove in 2001 w/ a neg NCV study;  DrAplington w/ meds and TENS trial (per her request- it helped her brother)... now DrRamos w/ PT, water exercises, and plans for epid shot... tried Lyrica w/o benefit...  ANXIETY (ICD-300.00) - she declines anxiolytic Rx.  SHINGLES (ICD-053.9) - developed shingles Mar09 involving right sacral dermatomes w/ rash and pain in the perineal and buttock areas... treated via ER & several visits w/ NP w/ Valtrex, Pred, Lyrica, Vicodin... lesions resolved, some persist post-herpetic neuralgia...  ANEMIA, MILD (ICD-285.9)  ~  labs 9/09 showed Hg= 11.8.[name].  ~  labs 3/10 showed Hg= 11.6, Fe= 64 (sat=20%)  ~  labs 4/11 showed =   HEALTH MAINTENANCE:  she had the 2010 Flu shot 9/10... PNEUMOVAX in 2006 age age 47...   Allergies: 1)  ! Penicillin 2)  ! Sulfa 3)  ! Xanax  Comments:  Nurse/Medical Assistant: The patient's medications and allergies were reviewed with the patient and were updated in the Medication and Allergy Lists.  Past History:  Past Medical History:  HEARING LOSS (ICD-389.9) BRONCHITIS, RECURRENT (ICD-491.9) HYPERTENSION (ICD-401.9) CHEST WALL PAIN, HX OF (ICD-V15.89) AORTIC VALVE DISORDERS (ICD-424.1) VENOUS INSUFFICIENCY (ICD-459.81) HYPERLIPIDEMIA (ICD-272.4) DIABETES MELLITUS, BORDERLINE (ICD-790.29) HYPOTHYROIDISM (ICD-244.9) GASTRITIS (ICD-535.50) DIVERTICULOSIS OF COLON (ICD-562.10) IRRITABLE BOWEL SYNDROME (ICD-564.1) DEGENERATIVE JOINT DISEASE (ICD-715.90) BACK PAIN (ICD-724.5) LEG PAIN, BILATERAL  (ICD-729.5) OSTEOPOROSIS (ICD-733.00) Hx of TRANSIENT ISCHEMIC ATTACK (ICD-435.9) PERIPHERAL NEUROPATHY (ICD-356.9) HERPES ZOSTER (ICD-053.9) POSTHERPETIC NEURALGIA (ICD-053.19) ANXIETY (ICD-300.00) SHINGLES (ICD-053.9) ANEMIA, MILD (ICD-285.9)  Past Surgical History: Appendectomy Cholecystectomy Hysterectomy Tonsillectomy  Family History: Reviewed history from 01/04/2009 and no changes required. heart disease - mother, father rheumatism - mother cancer - PGM (gallbladder) DM - father, sister, brother cerebral hemmorage - father  Social History: Reviewed history from 01/04/2009 and no changes required. never no alcohol 1-2 cups 1/2caffeine coffee daily lives alone widowed 4 children retired [employer] operational officer  Review of Systems      See HPI       The patient complains of decreased hearing, dyspnea on exertion, and difficulty walking.  The patient denies anorexia, fever, weight loss, weight gain, vision loss, hoarseness, chest pain, syncope, peripheral edema, prolonged cough, headaches, hemoptysis, abdominal pain, melena, hematochezia, severe indigestion/heartburn, hematuria, incontinence, muscle weakness, suspicious skin lesions, transient blindness, depression, unusual weight change, abnormal bleeding, enlarged lymph nodes, and  angioedema.    Vital Signs:  Patient profile:   77 year old female Height:      65 inches Weight:      152 pounds O2 Sat:      95 % on Room air Temp:     97.5 degrees F oral Pulse rate:   64 / minute BP sitting:   118 / 62  (right arm) Cuff size:   regular  Vitals Entered By: Randell Loop CMA (July 31, 2009 11:47 AM)  O2 Sat at Rest %:  95 O2 Flow:  Room air CC: 6 month ROV & review of mult medical problems... Is Patient Diabetic? Yes Pain Assessment Patient in pain? no      Comments meds updated   Physical Exam  Additional Exam:  WD, WN, 77 y/o WF in NAD... GENERAL:  Alert & oriented; pleasant & cooperative... HEENT:  [place]/AT,  EOM-full, PERRLA, EACs-clear (she has bilat hearing aides), TMs-wnl, NOSE-clear, THROAT-clear & wnl. NECK:  Supple w/ fair ROM; no JVD; normal carotid impulses w/o bruits; no thyromegaly or nodules palpated; no lymphadenopathy. CHEST:  Clear to P & A; without wheezes/ rales/ or rhonchi. HEART:  Regular gr 2/6 SEM S4 without rubs... ABDOMEN:  Soft & nontender; normal bowel sounds; no organomegaly or masses detected. EXT: without deformities, mild arthritic changes; +varicose veins/ +venous insuffic/ no edema. NEURO:  CN's intact; no focal neuro deficits x decr sensation in LE's c/w periph neuropathy.  DERM:  No lesions noted; no rash etc...    MISC. Report  Procedure date:  07/31/2009  Findings:      Pt will return to our lab next week for FASTING blood work... we will review results and call report... She will also be sched for 2DEcho...   SN   Impression & Recommendations:  Problem # 1:  HYPERTENSION (ICD-401.9) Controleed on med-  continue same. Her updated medication list for this problem includes:    Diovan Hct 80-12.5 Mg Tabs (Valsartan-hydrochlorothiazide) .[name]... Take 1 tablet by mouth once a day  Problem # 2:  AORTIC VALVE DISORDERS (ICD-424.1) We discussed f/u 2DEcho to f/u on AoV & her murmur... The following medications were removed from the medication list:    Aspirin 325 Mg Tabs (Aspirin) .[name]... Take 1 tablet by mouth every morning Her updated medication list for this problem includes:    Adult Aspirin Ec Low Strength 81 Mg Tbec (Aspirin) .[name]... Take 1 tab by mouth at bedtime  Orders: 2 D Echo (2 D Echo)  Problem # 3:  VENOUS INSUFFICIENCY (ICD-459.81) She has chr VI changes, no signif edema, chr leg symptoms as noted...  Problem # 4:  HYPERLIPIDEMIA (ICD-272.4) Due for f/u FLP on the Simva40+Fish Oil Rx >> she will ret next week. Her updated medication list for this problem includes:    Simvastatin 40 Mg Tabs (Simvastatin) .[name]... Take 1 tab by mouth at  bedtime  Problem # 5:  DIABETES MELLITUS, BORDERLINE (ICD-790.29) Stable on diet Rx...  Problem # 6:  HYPOTHYROIDISM (ICD-244.9) Stable on the [id number]... Her updated medication list for this problem includes:    Synthroid 125 Mcg Tabs (Levothyroxine sodium) .[name]... Take 1 tab by mouth once daily...  Problem # 7:  BACK PAIN (ICD-724.5) She has chr pain syndrome managed by DrRamos... The following medications were removed from the medication list:    Aspirin 325 Mg Tabs (Aspirin) .[name]... Take 1 tablet by mouth every morning Her updated medication list for this problem includes:    Adult Aspirin  Ec Low Strength 81 Mg Tbec (Aspirin) .[name]... Take 1 tab by mouth at bedtime  Problem # 8:  OSTEOPOROSIS (ICD-733.00) Maintained on Fosamax, Calcium, Vits... Her updated medication list for this problem includes:    Alendronate Sodium 70 Mg Tabs (Alendronate sodium) .[name]... Take one tablet by mouth once weekly  Problem # 9:  OTHER MEDICAL PROBLEMS AS NOTED>>>  Complete Medication List: 1)  Adult Aspirin Ec Low Strength 81 Mg Tbec (Aspirin) .... Take 1 tab by mouth at bedtime 2)  Diovan Hct 80-12.5 Mg Tabs (Valsartan-hydrochlorothiazide) .... Take 1 tablet by mouth once a day 3)  Simvastatin 40 Mg Tabs (Simvastatin) .... Take 1 tab by mouth at bedtime 4)  Fish Oil 1000 Mg Caps (Omega-3 fatty acids) .... Take 1 tablet by mouth once a day 5)  Coq10 100 Mg Caps (Coenzyme q10) .... Take 1 capsule by mouth once a day 6)  Synthroid 125 Mcg Tabs (Levothyroxine sodium) .... Take 1 tab by mouth once daily.Marland KitchenMarland Kitchen 7)  Miralax Powd (Polyethylene glycol 3350) .... As needed 8)  Stool Softener 100 Mg Caps (Docusate sodium) .... Per bottle 9)  Probiotic Caps (Misc intestinal flora regulat) .... Take 1 tablet by mouth once a day 10)  Alendronate Sodium 70 Mg Tabs (Alendronate sodium) .... Take one tablet by mouth once weekly 11)  Caltrate 600+d 600-400 Mg-unit Tabs (Calcium carbonate-vitamin d) .[name].. 1 tab by mouth two  times a day... 12)  Vitamin B-12 500 Mcg Tabs (Cyanocobalamin) .... Take 1 tablet by mouth once a day 13)  Vitamin D 1000 Unit Tabs (Cholecalciferol) .... Take 1 tablet by mouth once a day 14)  Alpha-lipoic Acid 200 Mg Caps (Alpha-lipoic acid) .... Take 1 tablet by mouth once a day 15)  Onetouch Ultra Test Strp (Glucose blood) .... As directed to test glucose  Patient Instructions: 1)  Today we updated your med list- see below.... 2)  Continue your current meds the same... 3)  Please return to our lab one morning next week for your FASTING blood work...  then call the "phone tree" in a few days for your lab results.Marland KitchenMarland Kitchen  4)  We will also sched a 2D Echocardiogram for you at the [facility] [place] office [facility])... we will call you w/ this result when avail.Marland KitchenMarland Kitchen 5)  Stay as active as possible... 6)  Call for any problems.Marland KitchenMarland Kitchen 7)  Please schedule a follow-up appointment in 6 months.   Immunization History:  Influenza Immunization History:    Influenza:  historical (12/21/2008)

## 2010-05-16 NOTE — Op Note (Signed)
Summary: [facility]  [facility]   Imported By: Sherian Rein 10/01/2009 11:34:56  _____________________________________________________________________  External Attachment:    Type:   Image     Comment:   External Document

## 2010-05-16 NOTE — Progress Notes (Signed)
Summary: refill  Phone Note Call from Patient Call back at Home Phone [phone]   Caller: Patient Call For: Kayla Davis Summary of Call: pt needs refill of DIOVAN HCT. [facility] [phone] Initial call taken by: Tivis Ringer, CNA,  October 01, 2009 10:59 AM  Follow-up for Phone Call        [facility] faxed to pharmacy. Meliah Appleman CMA  October 01, 2009 11:13 AM     Prescriptions: DIOVAN HCT 80-12.5 MG  TABS (VALSARTAN-HYDROCHLOROTHIAZIDE) Take 1 tablet by mouth once a day  #90 x 1   Entered by:   Zackery Barefoot CMA   Authorized by:   Michele Mcalpine MD   Signed by:   Zackery Barefoot CMA on 10/01/2009   Method used:   Faxed to ...       [facility] (mail-order)       [address] [place], [place]  [address]       Ph: [phone]       Fax: [phone]   RxID:   [id number]

## 2010-05-16 NOTE — Progress Notes (Signed)
Summary: handicap parking  Phone Note Call from Patient Call back at Home Phone [phone]   Caller: Patient Call For: nadel Summary of Call: pt wants to know if the paper (for handicap parking) that she mailed to dr nadel 04/15/10 was mailed to [facility]. she has not received anything back yet.  Initial call taken by: Tivis Ringer, CNA,  May 01, 2010 2:01 PM  Follow-up for Phone Call        Marliss Czar, do you know if this was done.  Pls advise thanks! Follow-up by: Vernie Murders,  May 01, 2010 3:21 PM  Additional Follow-up for Phone Call Additional follow up Details #1::        i am not sure---i dont see anything in the chart about this---pt may want to call the [facility] to check on this.  thanks Randell Loop [facility]  May 01, 2010 3:24 PM     Additional Follow-up for Phone Call Additional follow up Details #2::    Spoke with pt and advised of the above recs per [place].  Pt verbalized understanding and states will call the DMV.  Follow-up by: Vernie Murders,  May 01, 2010 3:28 PM

## 2010-05-16 NOTE — Assessment & Plan Note (Signed)
Summary: NP follow up - low Na   CC:  follow up for low Na levels and states she has decreased her water intake and cramps have lesened in frequency and severity..  History of Present Illness: 77  y/o WF  with known history of HTN and hyperlipidemia.     ~  3/09 developed shingles involving right sacral dermatomes w/ rash and pain in the perineal and buttock areas... treated via ER & several visits w/ NP w/ Valtrex, Pred, Lyrica, Vicodin... lesions resolved, some persist post-herpetic neuralgia...   April 12, 2008 --Complains of 3 weeks pain in both legs  pt stopped simvastatin, lyrica and fosamax when leg sx started to see if one of these meds was the cause  . No change in pain wtih stopping meds.  REC; advil x 7d tramadol, restart lyrica  April 25, 2008--2 week follow up - states the heat helps, but the pain has returned since stopping the advil x7days; toe does not hurt as bad. Aches in both legs, right>left, hips worse with walking, pain down upper legs, pain in  lower legs some. No pain in back. Heat helps, advil helped- pain resolved. but started back when stopped advil. Pain with trying to get up out of bed and chair.   June 21, 2008:  she persisits w/ mult somatic complaints--- "my legs hurt in the winter" & " I over did Christmas"... she states she has tried off Simvastatin, Lyrica, Fosamax & others but nothing helps and her symptoms persist... XRays showed mild DJD both hips & lower spine;  & BMD 5/09 showed min osteopenia w/ TScores -0.0 in Spine & -1.4 in right FemNeck... she has tried Rx w/ Tramadol, Advil, Osteobiflex, heat, etc...   Aug 24, 2008    1. Complains of  lower back pain and B leg pain. Pt states she has difficulty sleeping at night and walking due to the pain.  Last visit, Lyrica was added at bedtime. Has not seen much improvement. Continues to cause her problems over last 6 months. She has a hx of chronic leg discomfort .. extensive work up in the past- DrStuckey  2001 w/ normal art dopplers; DrLove in 2001 w/ a neg NCV study;  DrAplington w/ meds and TENS trial (per her request- it helped her brother)...felt to be peripheral neuropathy. Also shingles Mar09 involving right sacraSeptember 23, 2010--Presents for work in visit. Complains of upper stomach pain over last 6 months, states is worse when she wakes up in the morning, and after eating stating that she "can't hardly stand it until she burps."  has been treating with tums and pepo-bismol. Worse over last few weeks. Bloated, gas. Burping really helps his symptoms. Denies chest pain, dyspnea, orthopnea, hemoptysis, fever, n/v/d, edema, headache,recent travel, bloody stools, weight loss. Does have intermittent consitpation.   06/08/09-Presents for work in visit. Complains of leg cramps at night for several weeks - keeps pt up at hs - walking around doesn't help much. Describes as cramp in lower leg that "draws " her leg. Happens mainly at night. Very sore after they occur. Denies known injury, chest pain, swelling, dyspnea.. Denies chest pain, dyspnea, orthopnea, hemoptysis, fever, n/v/d, edema, headache, recent travel.   06/28/09-Presents for return for leg cramps and abn Na on chemistry panel. Last visit w/ persistent leg cramps. Labs w/ nml K= however Na+ was down at 126. Leg cramps are better w/ heat and stretching recs. Na+ today improved at 138. She is doing better. Denies chest pain, dyspnea,  orthopnea, hemoptysis, fever, n/v/d, edema, headache.   Medications Prior to Update: 1)  Adult Aspirin Ec Low Strength 81 Mg  Tbec (Aspirin) .... *** On Hold *** 2)  Diovan Hct 80-12.5 Mg  Tabs (Valsartan-Hydrochlorothiazide) .... Take 1 Tablet By Mouth Once A Day 3)  Simvastatin 40 Mg Tabs (Simvastatin) .... *** On Hold *** 4)  Fish Oil 1000 Mg Caps (Omega-3 Fatty Acids) .... Take 1 Tablet By Mouth Once A Day 5)  Coq10 100 Mg  Caps (Coenzyme Q10) .... 2 Tablets Per Day 6)  Synthroid 125 Mcg Tabs (Levothyroxine Sodium)  .... Take 1 Tab By Mouth Once Daily.Marland KitchenMarland Kitchen 7)  Miralax  Powd (Polyethylene Glycol 3350) .... As Needed 8)  Stool Softener 100 Mg Caps (Docusate Sodium) .... Per Bottle 9)  Probiotic  Caps (Misc Intestinal Flora Regulat) .... Take 1 Tablet By Mouth Once A Day 10)  Osteo Bi-Flex Adv Double St   Tabs (Misc Natural Products) .... Take 2 Tablet By Mouth Once A Day 11)  Alendronate Sodium 70 Mg Tabs (Alendronate Sodium) .... Take One Tablet By Mouth Once Weekly 12)  Caltrate 600+d 600-400 Mg-Unit  Tabs (Calcium Carbonate-Vitamin D) .[name].. 1 Tab By Mouth Two Times A Day... 13)  Vitamin B-12 500 Mcg  Tabs (Cyanocobalamin) .... Take 1 Tablet By Mouth Once A Day 14)  Vitamin D 1000 Unit  Tabs (Cholecalciferol) .... Take 1 Tablet By Mouth Once A Day 15)  Geritol Complete  Tabs (Iron-Vitamins) .... Take 1 Tablet By Mouth Once A Day 16)  Alpha-Lipoic Acid 200 Mg Caps (Alpha-Lipoic Acid) .... Take 1 Tablet By Mouth Once A Day 17)  Lyrica 75 Mg  Caps (Pregabalin) .[name].. 1 By Mouth At Bedtime 18)  Onetouch Ultra Test  Strp (Glucose Blood) .... As Directed To Test Glucose  Current Medications (verified): 1)  Adult Aspirin Ec Low Strength 81 Mg  Tbec (Aspirin) .... Take 1 Tab By Mouth At Bedtime 2)  Diovan Hct 80-12.5 Mg  Tabs (Valsartan-Hydrochlorothiazide) .... Take 1 Tablet By Mouth Once A Day 3)  Simvastatin 40 Mg Tabs (Simvastatin) .... Take 1 Tab By Mouth At Bedtime 4)  Fish Oil 1000 Mg Caps (Omega-3 Fatty Acids) .... Take 1 Tablet By Mouth Once A Day 5)  Coq10 100 Mg  Caps (Coenzyme Q10) .... Take 1 Capsule By Mouth Once A Day 6)  Synthroid 125 Mcg Tabs (Levothyroxine Sodium) .... Take 1 Tab By Mouth Once Daily.Marland KitchenMarland Kitchen 7)  Miralax  Powd (Polyethylene Glycol 3350) .... As Needed 8)  Stool Softener 100 Mg Caps (Docusate Sodium) .... Per Bottle 9)  Probiotic  Caps (Misc Intestinal Flora Regulat) .... Take 1 Tablet By Mouth Once A Day 10)  Osteo Bi-Flex Adv Double St   Tabs (Misc Natural Products) .... Take 2 Tablet By  Mouth Once A Day 11)  Alendronate Sodium 70 Mg Tabs (Alendronate Sodium) .... Take One Tablet By Mouth Once Weekly 12)  Caltrate 600+d 600-400 Mg-Unit  Tabs (Calcium Carbonate-Vitamin D) .[name].. 1 Tab By Mouth Two Times A Day... 13)  Vitamin B-12 500 Mcg  Tabs (Cyanocobalamin) .... Take 1 Tablet By Mouth Once A Day 14)  Vitamin D 1000 Unit  Tabs (Cholecalciferol) .... Take 1 Tablet By Mouth Once A Day 15)  Geritol Complete  Tabs (Iron-Vitamins) .... Take 1 Tablet By Mouth Once A Day 16)  Alpha-Lipoic Acid 200 Mg Caps (Alpha-Lipoic Acid) .... Take 1 Tablet By Mouth Once A Day 17)  Lyrica 75 Mg  Caps (Pregabalin) .[name].. 1 By  Mouth At Bedtime 18)  Onetouch Ultra Test  Strp (Glucose Blood) .... As Directed To Test Glucose 19)  Aspirin 325 Mg Tabs (Aspirin) .... Take 1 Tablet By Mouth Every Morning  Allergies (verified): 1)  ! Penicillin 2)  ! Sulfa 3)  ! Xanax  Past History:  Past Medical History: Last updated: 02/01/2009 HEARING LOSS (ICD-389.9) BRONCHITIS, RECURRENT (ICD-491.9) HYPERTENSION (ICD-401.9) CHEST WALL PAIN, HX OF (ICD-V15.89) HYPERLIPIDEMIA (ICD-272.4) DIABETES MELLITUS, BORDERLINE (ICD-790.29) HYPOTHYROIDISM (ICD-244.9) GASTRITIS (ICD-535.50) DIVERTICULOSIS OF COLON (ICD-562.10) IRRITABLE BOWEL SYNDROME (ICD-564.1) DEGENERATIVE JOINT DISEASE (ICD-715.90) BACK PAIN (ICD-724.5) LEG PAIN, BILATERAL (ICD-729.5) OSTEOPOROSIS (ICD-733.00) Hx of TRANSIENT ISCHEMIC ATTACK (ICD-435.9) PERIPHERAL NEUROPATHY (ICD-356.9) HERPES ZOSTER (ICD-053.9) POSTHERPETIC NEURALGIA (ICD-053.19) ANXIETY (ICD-300.00) SHINGLES (ICD-053.9) ANEMIA, MILD (ICD-285.9)  Past Surgical History: Last updated: 02/01/2009 Appendectomy Cholecystectomy Hysterectomy Tonsillectomy  Family History: Last updated: 01/04/2009 heart disease - mother, father rheumatism - mother cancer - PGM (gallbladder) DM - father, sister, brother cerebral hemmorage - father  Social History: Last updated:  01/04/2009 never no alcohol 1-2 cups 1/2caffeine coffee daily lives alone widowed 4 children retired [employer] operational officer  Risk Factors: Smoking Status: never (02/08/2008)  Review of Systems      See HPI  Vital Signs:  Patient profile:   77 year old female Height:      65 inches Weight:      156.06 pounds O2 Sat:      98 % on Room air Temp:     96.8 degrees F oral Pulse rate:   68 / minute BP sitting:   124 / 56  (left arm) Cuff size:   regular  Vitals Entered By: Boone Master CNA (June 28, 2009 11:43 AM)  O2 Flow:  Room air CC: follow up for low Na levels, states she has decreased her water intake and cramps have lesened in frequency and severity. Is Patient Diabetic? No Comments Medications reviewed with patient Daytime contact number verified with patient. Boone Master CNA  June 28, 2009 11:35 AM    Physical Exam  Additional Exam:  WD, WN, 77 y/o WF in NAD... GENERAL:  Alert & oriented; pleasant & cooperative... HEENT:  [place]/AT,   EACs-clear (she has bilat hearing aides), TMs-wnl, NOSE-clear, THROAT-clear & wnl. NECK:  Supple w/ fair ROM; no JVD; normal carotid impulses w/o bruits; no thyromegaly or nodules palpated; no lymphadenopathy. CHEST:  Clear to P & A; without wheezes/ rales/ or rhonchi. HEART:  Regular gr 1/6 SEM S4 without rubs... ABDOMEN:  Soft & nontender; normal bowel sounds; no organomegaly or masses detected.,  EXT: without deformities, mild arthritic changes; +varicose veins/ +venous insuffic/ no edema, neg homans sign NEURO:  CN's intact; no focal neuro deficits x decr sensation in LE's c/w periph neuropathy.  DERM:  No lesions noted; no rash etc...   Impression & Recommendations:  Problem # 1:  LEG CRAMPS (ICD-729.82)  Improved w/  heat to legs and stretching. K+ okay.  cont w/ same recs.   Orders: Est. Patient Level IV (40981)  Problem # 2:  HYPONATREMIA (ICD-276.1)  Na improved on recheck today same recs she did cut back on large water  intake.   Orders: Est. Patient Level IV (19147)  Medications Added to Medication List This Visit: 1)  Adult Aspirin Ec Low Strength 81 Mg Tbec (Aspirin) .... Take 1 tab by mouth at bedtime 2)  Simvastatin 40 Mg Tabs (Simvastatin) .... Take 1 tab by mouth at bedtime 3)  Coq10 100 Mg Caps (Coenzyme q10) .... Take 1 capsule by mouth once a  day 4)  Aspirin 325 Mg Tabs (Aspirin) .... Take 1 tablet by mouth every morning  Complete Medication List: 1)  Adult Aspirin Ec Low Strength 81 Mg Tbec (Aspirin) .... Take 1 tab by mouth at bedtime 2)  Diovan Hct 80-12.5 Mg Tabs (Valsartan-hydrochlorothiazide) .... Take 1 tablet by mouth once a day 3)  Simvastatin 40 Mg Tabs (Simvastatin) .... Take 1 tab by mouth at bedtime 4)  Fish Oil 1000 Mg Caps (Omega-3 fatty acids) .... Take 1 tablet by mouth once a day 5)  Coq10 100 Mg Caps (Coenzyme q10) .... Take 1 capsule by mouth once a day 6)  Synthroid 125 Mcg Tabs (Levothyroxine sodium) .... Take 1 tab by mouth once daily.Marland KitchenMarland Kitchen 7)  Miralax Powd (Polyethylene glycol 3350) .... As needed 8)  Stool Softener 100 Mg Caps (Docusate sodium) .... Per bottle 9)  Probiotic Caps (Misc intestinal flora regulat) .... Take 1 tablet by mouth once a day 10)  Osteo Bi-flex Adv Double St Tabs (Misc natural products) .... Take 2 tablet by mouth once a day 11)  Alendronate Sodium 70 Mg Tabs (Alendronate sodium) .... Take one tablet by mouth once weekly 12)  Caltrate 600+d 600-400 Mg-unit Tabs (Calcium carbonate-vitamin d) .[name].. 1 tab by mouth two times a day... 13)  Vitamin B-12 500 Mcg Tabs (Cyanocobalamin) .... Take 1 tablet by mouth once a day 14)  Vitamin D 1000 Unit Tabs (Cholecalciferol) .... Take 1 tablet by mouth once a day 15)  Geritol Complete Tabs (Iron-vitamins) .... Take 1 tablet by mouth once a day 16)  Alpha-lipoic Acid 200 Mg Caps (Alpha-lipoic acid) .... Take 1 tablet by mouth once a day 17)  Lyrica 75 Mg Caps (Pregabalin) .[name].. 1 by mouth at bedtime 18)  Onetouch  Ultra Test Strp (Glucose blood) .... As directed to test glucose 19)  Aspirin 325 Mg Tabs (Aspirin) .... Take 1 tablet by mouth every morning  Other Orders: TLB-BMP (Basic Metabolic Panel-BMET) (80048-METABOL)  Patient Instructions: 1)  Light stretching of legs two times a day  2)  Apply heat for prior to going to bed to legs  3)  I will call with lab results.  4)  follow up 6 weeks Dr. Kriste Basque and as needed  5)  Please contact office for sooner follow up if symptoms do not improve or worsen

## 2010-05-16 NOTE — Letter (Signed)
Summary: [facility] Progress Note   [facility] Progress Note   Imported By: Roderic Ovens 04/22/2010 14:06:33  _____________________________________________________________________  External Attachment:    Type:   Image     Comment:   External Document

## 2010-05-16 NOTE — Miscellaneous (Signed)
Summary: Device preload  Clinical Lists Changes  Observations: Added new observation of PPM INDICATN: Syncope (02/14/2010 13:32) Added new observation of MAGNET RTE: BOL 85 ERI 65 (02/14/2010 13:32) Added new observation of PPMLEADSTAT2: active (02/14/2010 13:32) Added new observation of PPMLEADSER2: 045409  (02/14/2010 13:32) Added new observation of PPMLEADMOD2: 4470  (02/14/2010 13:32) Added new observation of PPMLEADDOI2: 02/11/2010  (02/14/2010 13:32) Added new observation of PPMLEADLOC2: RV  (02/14/2010 13:32) Added new observation of PPMLEADSTAT1: active  (02/14/2010 13:32) Added new observation of PPMLEADSER1: 811914  (02/14/2010 13:32) Added new observation of PPMLEADMOD1: 4469  (02/14/2010 13:32) Added new observation of PPMLEADDOI1: 02/11/2010  (02/14/2010 13:32) Added new observation of PPMLEADLOC1: RA  (02/14/2010 13:32) Added new observation of PPM DOI: 02/11/2010  (02/14/2010 13:32) Added new observation of PPM SERL#: [id number]  (02/14/2010 13:32) Added new observation of PPM MODL#: [id number]  (02/14/2010 08:65) Added new observation of PACEMAKERMFG: [employer]tronic  (02/14/2010 13:32) Added new observation of PPM IMP MD: Duffy Rhody Tennant,MD  (02/14/2010 13:32) Added new observation of PPM REFER MD: Rolla Plate  (02/14/2010 13:32) Added new observation of PACEMAKER MD: Hillis Range, MD  (02/14/2010 13:32)      PPM Specifications Following MD:  Hillis Range, MD     Referring MD:  Rolla Plate PPM Vendor:  Medtronic     PPM Model Number:  [id number]     PPM Serial Number:  [id number] PPM DOI:  02/11/2010     PPM Implanting MD:  Rolla Plate  Lead 1    Location: RA     DOI: 02/11/2010     Model #: [id number]     Serial #: [id number]     Status: active Lead 2    Location: RV     DOI: 02/11/2010     Model #: 4470     Serial #: [id number]     Status: active  Magnet Response Rate:  BOL 85 ERI 65  Indications:  Syncope

## 2010-05-16 NOTE — Cardiovascular Report (Signed)
Summary: Outpatient Coinsurance Notice   Outpatient Coinsurance Notice   Imported By: Roderic Ovens 08/27/2009 12:30:08  _____________________________________________________________________  External Attachment:    Type:   Image     Comment:   External Document

## 2010-05-16 NOTE — Progress Notes (Signed)
Summary: rx-  Phone Note Call from Patient Call back at Home Phone [phone]   Caller: Patient Call For: Shady Padron Reason for Call: Talk to Nurse Summary of Call: need Test Right strips rx - #200 called in to [facility] [phone]  Initial call taken by: Eugene Gavia,  April 20, 2009 12:02 PM  Follow-up for Phone Call        [facility] to verify what strips. [name] CMA  April 20, 2009 12:53 PM  pt states she uses one touch ultra strips. rx sent to [facility]. pt aware.[name] CMA  April 20, 2009 12:58 PM     New/Updated Medications: ONETOUCH ULTRA TEST  STRP (GLUCOSE BLOOD) as directed to test glucose Prescriptions: ONETOUCH ULTRA TEST  STRP (GLUCOSE BLOOD) as directed to test glucose  #200 x 2   Entered by:   [name] CMA   Authorized by:   Michele Mcalpine MD   Signed by:   [name] CMA on 04/20/2009   Method used:   Faxed to ...       [facility] (mail-order)       [address] [place], [place]  [address]       Ph: [phone]       Fax: [phone]   RxID:   [id number]

## 2010-05-16 NOTE — Assessment & Plan Note (Signed)
Summary: rov 6 months///kp   CC:  6 mth rov - .  History of Present Illness: 77  y/o WF  with known history of HTN and hyperlipidemia.    06/08/09-Presents for work in visit. Complains of leg cramps at night for several weeks - keeps pt up at hs - walking around doesn't help much. Describes as cramp in lower leg that "draws " her leg. Happens mainly at night. Very sore after they occur. Denies known injury, chest pain, swelling, dyspnea.. Denies chest pain, dyspnea, orthopnea, hemoptysis, fever, n/v/d, edema, headache, recent travel.   06/28/09-Presents for return for leg cramps and abn Na on chemistry panel. Last visit w/ persistent leg cramps. Labs w/ nml K= however Na+ was down at 126. Leg cramps are better w/ heat and stretching recs. Na+ today improved at 138. She is doing better. Denies chest pain, dyspnea, orthopnea, hemoptysis, fever, n/v/d, edema, headache.      ~  July 31, 2009:  she states that she feels OK overall, energy good (so she quit her iron Rx), but complains "my toes are stiff & red at night, not limber like my fingers"... she's had 2 shots in back from [facility] w/ temp relief... she has mild AoV dis w/ thickened valve ?mild AS- needs f/u 2DEcho... BP controlled on DiovanHCT;  remains on Simva40+FishOil & due for FLP;  BS controlled on diet alone;  other problems as noted below...   January 29, 2010--Presents for 6 mth  follow up We discussed several of her meds. She would like to stop Lyrica, previously started on for post herpetic neuralgia. no longer  having pain in groin area. She would like to stop for a while and decide if pain returns. Does complain of increased urine urgency at times and dark urine. Denies chest pain,  orthopnea, hemoptysis, fever, n/v/d, edema, headache,.          Current Medications (verified): 1)  Aspirin 325 Mg Tabs (Aspirin) .... Take One By Mouth Once Daily 2)  Diovan Hct 80-12.5 Mg  Tabs (Valsartan-Hydrochlorothiazide) .... Take 1 Tablet By  Mouth Once A Day 3)  Simvastatin 40 Mg Tabs (Simvastatin) .... Take 1 Tab By Mouth At Bedtime 4)  Fish Oil 1000 Mg Caps (Omega-3 Fatty Acids) .... Take 1 Tablet By Mouth Once A Day 5)  Coq10 100 Mg  Caps (Coenzyme Q10) .... Take 1 Capsule By Mouth Once A Day 6)  Synthroid 125 Mcg Tabs (Levothyroxine Sodium) .... Take 1 Tab By Mouth Once Daily.Marland KitchenMarland Kitchen 7)  Miralax  Powd (Polyethylene Glycol 3350) .... As Needed 8)  Stool Softener 100 Mg Caps (Docusate Sodium) .... Per Bottle 9)  Probiotic  Caps (Misc Intestinal Flora Regulat) .... Take 1 Tablet By Mouth Once A Day 10)  Alendronate Sodium 70 Mg Tabs (Alendronate Sodium) .... Take One Tablet By Mouth Once Weekly 11)  Caltrate 600+d 600-400 Mg-Unit  Tabs (Calcium Carbonate-Vitamin D) .[name].. 1 Tab By Mouth Once Daily 12)  Vitamin B-12 500 Mcg  Tabs (Cyanocobalamin) .... Take 1 Tablet By Mouth Once A Day 13)  Vitamin D 1000 Unit  Tabs (Cholecalciferol) .... Take 1 Tablet By Mouth Once A Day 14)  Alpha-Lipoic Acid 200 Mg Caps (Alpha-Lipoic Acid) .... Take 1 Tablet By Mouth Once A Day 15)  Onetouch Ultra Test  Strp (Glucose Blood) .... As Directed To Test Glucose 16)  Lyrica 75 Mg Caps (Pregabalin) .... Take One Tablet By Mouth At Bedtime  Allergies (verified): 1)  ! Penicillin 2)  !  Sulfa 3)  ! Xanax  Comments:  Nurse/Medical Assistant: The patient's medications and allergies were reviewed with the patient and were updated in the Medication and Allergy Lists.  Past History:  Past Medical History: Last updated: 07/31/2009  HEARING LOSS (ICD-389.9) BRONCHITIS, RECURRENT (ICD-491.9) HYPERTENSION (ICD-401.9) CHEST WALL PAIN, HX OF (ICD-V15.89) AORTIC VALVE DISORDERS (ICD-424.1) VENOUS INSUFFICIENCY (ICD-459.81) HYPERLIPIDEMIA (ICD-272.4) DIABETES MELLITUS, BORDERLINE (ICD-790.29) HYPOTHYROIDISM (ICD-244.9) GASTRITIS (ICD-535.50) DIVERTICULOSIS OF COLON (ICD-562.10) IRRITABLE BOWEL SYNDROME (ICD-564.1) DEGENERATIVE JOINT DISEASE  (ICD-715.90) BACK PAIN (ICD-724.5) LEG PAIN, BILATERAL (ICD-729.5) OSTEOPOROSIS (ICD-733.00) Hx of TRANSIENT ISCHEMIC ATTACK (ICD-435.9) PERIPHERAL NEUROPATHY (ICD-356.9) HERPES ZOSTER (ICD-053.9) POSTHERPETIC NEURALGIA (ICD-053.19) ANXIETY (ICD-300.00) SHINGLES (ICD-053.9) ANEMIA, MILD (ICD-285.9)  Past Surgical History: Last updated: 07/31/2009 Appendectomy Cholecystectomy Hysterectomy Tonsillectomy  Family History: Last updated: 01/04/2009 heart disease - mother, father rheumatism - mother cancer - PGM (gallbladder) DM - father, sister, brother cerebral hemmorage - father  Social History: Last updated: 01/04/2009 never no alcohol 1-2 cups 1/2caffeine coffee daily lives alone widowed 4 children retired [employer] operational officer  Review of Systems      See HPI  Vital Signs:  Patient profile:   77 year old female Height:      65 inches Weight:      157.38 pounds BMI:     26.28 O2 Sat:      97 % on Room air Temp:     97.1 degrees F oral Pulse rate:   60 / minute BP sitting:   138 / 72  (left arm) Cuff size:   regular  Vitals Entered By: Abigail Miyamoto RN (January 29, 2010 10:06 AM)  O2 Flow:  Room air  Physical Exam  Additional Exam:  WD, WN, 77 y/o WF in NAD... GENERAL:  Alert & oriented; pleasant & cooperative... HEENT:  [place]/AT,   EACs-clear (she has bilat hearing aides), TMs-wnl, NOSE-clear, THROAT-clear & wnl. NECK:  Supple w/ fair ROM; no JVD; normal carotid impulses w/o bruits; no thyromegaly or nodules palpated; no lymphadenopathy. CHEST:  Clear to P & A; without wheezes/ rales/ or rhonchi. HEART:  Regular gr 1/6 SEM S4 without rubs... ABDOMEN:  Soft & nontender; normal bowel sounds; no organomegaly or masses detected.,  EXT: without deformities, mild arthritic changes; +varicose veins/ +venous insuffic/ no edema, neg homans sign NEURO:  CN's intact; no focal neuro deficits x decr sensation in LE's c/w periph neuropathy.  DERM:  No lesions noted; no rash  etc...   Impression & Recommendations:  Problem # 1:  POSTHERPETIC NEURALGIA (ICD-053.19)  May stop Lyrica , if leg pain worsens may restart.     Orders: Est. Patient Level IV (51884)  Problem # 2:  LEG CRAMPS (ICD-729.82)  warm heat to legs at bedtime   Orders: Est. Patient Level IV (16606)  Problem # 3:  DYSURIA (ICD-788.1) check urine  Orders: T-Urine Culture (Spectrum Order) (30160-10932) TLB-Udip w/ Micro (81001-URINE) Est. Patient Level IV (35573)  Problem # 4:  HYPERTENSION (ICD-401.9)  controllled on rx  Her updated medication list for this problem includes:    Diovan Hct 80-12.5 Mg Tabs (Valsartan-hydrochlorothiazide) .[name]... Take 1 tablet by mouth once a day  BP today: 138/72 Prior BP: 118/62 (07/31/2009)  Labs Reviewed: K+: 5.4 (08/14/2009) Creat: : 0.7 (08/14/2009)   Chol: 176 (08/14/2009)   HDL: 64.50 (08/14/2009)   LDL: 95 (08/14/2009)   TG: 81.0 (08/14/2009)  Orders: Est. Patient Level IV (22025)  Problem # 5:  DIABETES MELLITUS, BORDERLINE (ICD-790.29) diet discussed  labs pending.  Orders: TLB-BMP (Basic Metabolic Panel-BMET) (80048-METABOL)  TLB-A1C / Hgb A1C (Glycohemoglobin) (83036-A1C)  Problem # 6:  OSTEOPOROSIS (ICD-733.00) reviewed bmd with pt. no sign change in bmd, max t score -1.7  rec to cont on rx w/ calcium   Medications Added to Medication List This Visit: 1)  Aspirin 325 Mg Tabs (Aspirin) .... Take one by mouth once daily 2)  Caltrate 600+d 600-400 Mg-unit Tabs (Calcium carbonate-vitamin d) .[name].. 1 tab by mouth once daily  Complete Medication List: 1)  Aspirin 325 Mg Tabs (Aspirin) .... Take one by mouth once daily 2)  Diovan Hct 80-12.5 Mg Tabs (Valsartan-hydrochlorothiazide) .... Take 1 tablet by mouth once a day 3)  Simvastatin 40 Mg Tabs (Simvastatin) .... Take 1 tab by mouth at bedtime 4)  Fish Oil 1000 Mg Caps (Omega-3 fatty acids) .... Take 1 tablet by mouth once a day 5)  Coq10 100 Mg Caps (Coenzyme q10) .... Take 1  capsule by mouth once a day 6)  Synthroid 125 Mcg Tabs (Levothyroxine sodium) .... Take 1 tab by mouth once daily.Marland KitchenMarland Kitchen 7)  Miralax Powd (Polyethylene glycol 3350) .... As needed 8)  Stool Softener 100 Mg Caps (Docusate sodium) .... Per bottle 9)  Probiotic Caps (Misc intestinal flora regulat) .... Take 1 tablet by mouth once a day 10)  Alendronate Sodium 70 Mg Tabs (Alendronate sodium) .... Take one tablet by mouth once weekly 11)  Caltrate 600+d 600-400 Mg-unit Tabs (Calcium carbonate-vitamin d) .[name].. 1 tab by mouth once daily 12)  Vitamin B-12 500 Mcg Tabs (Cyanocobalamin) .... Take 1 tablet by mouth once a day 13)  Vitamin D 1000 Unit Tabs (Cholecalciferol) .... Take 1 tablet by mouth once a day 14)  Alpha-lipoic Acid 200 Mg Caps (Alpha-lipoic acid) .... Take 1 tablet by mouth once a day 15)  Onetouch Ultra Test Strp (Glucose blood) .... As directed to test glucose  Patient Instructions: 1)  May stop Lyrica , if leg pain worsens may restart.  2)  Your bone density looks about the same as 2009, continue on calcium and fosamax.  3)  We will check urine today, and blood sugars I will call with labs.  4)  Warm heat wraps at night to help with leg cramps.  5)  Stay activie as tolerated.  6)  Low fat/cholestrol diet.  7)  follow up Dr. Kriste Basque in 6 months

## 2010-05-16 NOTE — Medication Information (Signed)
[facility]   Imported By: Lester Koloa 04/24/2010 10:25:38  _____________________________________________________________________  External Attachment:    Type:   Image     Comment:   External Document

## 2010-05-17 ENCOUNTER — Ambulatory Visit: Admit: 2010-05-17 | Payer: Self-pay | Admitting: Internal Medicine

## 2010-05-17 ENCOUNTER — Encounter: Payer: Self-pay | Admitting: Internal Medicine

## 2010-05-17 ENCOUNTER — Encounter (INDEPENDENT_AMBULATORY_CARE_PROVIDER_SITE_OTHER): Payer: MEDICARE | Admitting: Internal Medicine

## 2010-05-17 DIAGNOSIS — I441 Atrioventricular block, second degree: Secondary | ICD-10-CM

## 2010-05-17 DIAGNOSIS — I495 Sick sinus syndrome: Secondary | ICD-10-CM | POA: Insufficient documentation

## 2010-05-22 NOTE — Assessment & Plan Note (Signed)
Summary: pc2/rs per bumplist/mj/jml   Vital Signs:  Patient profile:   77 year old female Height:      65 inches Weight:      153.25 pounds BMI:     25.59 Pulse rate:   71 / minute Pulse rhythm:   regular BP sitting:   158 / 72  (left arm) Cuff size:   large  Vitals Entered By: Danielle Rankin, CMA (May 17, 2010 12:15 PM)   Visit Type:  Pacemaker check Primary Provider:  Alroy Dust  CC:   .  History of Present Illness: The patient presents today for routine electrophysiology followup. She reports doing reasonbly well since her pacemaker was implanted.  She reports occasional shortness of breath. The patient denies symptoms of palpitations, chest pain,  orthopnea, PND, lower extremity edema, dizziness, presyncope, syncope, or neurologic sequela. The patient is tolerating medications without difficulties and is otherwise without complaint today.   Current Medications (verified): 1)  Aspirin 325 Mg Tabs (Aspirin) .... Take One By Mouth Once Daily 2)  Diovan Hct 80-12.5 Mg  Tabs (Valsartan-Hydrochlorothiazide) .... Take 1 Tablet By Mouth Once A Day 3)  Simvastatin 40 Mg Tabs (Simvastatin) .... Take 1 Tab By Mouth At Bedtime 4)  Fish Oil 1000 Mg Caps (Omega-3 Fatty Acids) .... Take 1 Tablet By Mouth Once A Day 5)  Coq10 100 Mg  Caps (Coenzyme Q10) .... Take 1 Capsule By Mouth Once A Day 6)  Synthroid 125 Mcg Tabs (Levothyroxine Sodium) .... Take 1 Tab By Mouth Once Daily.Marland KitchenMarland Kitchen 7)  Miralax  Powd (Polyethylene Glycol 3350) .... As Needed 8)  Stool Softener 100 Mg Caps (Docusate Sodium) .... Per Bottle 9)  Probiotic  Caps (Misc Intestinal Flora Regulat) .... Take 1 Tablet By Mouth Once A Day 10)  Alendronate Sodium 70 Mg Tabs (Alendronate Sodium) .... Take One Tablet By Mouth Once Weekly 11)  Caltrate 600+d 600-400 Mg-Unit  Tabs (Calcium Carbonate-Vitamin D) .[name].. 1 Tab By Mouth Once Daily 12)  Vitamin B-12 500 Mcg  Tabs (Cyanocobalamin) .... Take 1 Tablet By Mouth Once A Day 13)  Vitamin  D 1000 Unit  Tabs (Cholecalciferol) .... Take 1 Tablet By Mouth Once A Day 14)  Alpha-Lipoic Acid 200 Mg Caps (Alpha-Lipoic Acid) .... Take 1 Tablet By Mouth Once A Day 15)  Onetouch Ultra Test  Strp (Glucose Blood) .... As Directed To Test Glucose  Allergies: 1)  ! Penicillin 2)  ! Sulfa 3)  ! Xanax  Past History:  Past Medical History: HYPERTENSION (ICD-401.9) HYPERLIPIDEMIA (ICD-272.4) Hx of TRANSIENT ISCHEMIC ATTACK (ICD-435.9) AORTIC VALVE DISORDERS (ICD-424.1) VENOUS INSUFFICIENCY (ICD-459.81 PERIPHERAL NEUROPATHY (ICD-356.9) DIABETES MELLITUS, BORDERLINE (ICD-790.29) HEARING LOSS (ICD-389.9) BRONCHITIS, RECURRENT (ICD-491.9) HYPOTHYROIDISM (ICD-244.9) GASTRITIS (ICD-535.50) DIVERTICULOSIS OF COLON (ICD-562.10) IRRITABLE BOWEL SYNDROME (ICD-564.1) DEGENERATIVE JOINT DISEASE (ICD-715.90) BACK PAIN (ICD-724.5) LEG PAIN, BILATERAL (ICD-729.5) OSTEOPOROSIS (ICD-733.00) HERPES ZOSTER (ICD-053.9) POSTHERPETIC NEURALGIA (ICD-053.19) ANXIETY (ICD-300.00) ANEMIA, MILD (ICD-285.9)  Past Surgical History: Reviewed history from 05/16/2010 and no changes required. Pacemaker implanted Appendectomy Cholecystectomy Hysterectomy Tonsillectomy  Social History: Reviewed history from 05/16/2010 and no changes required. 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 Mouth:  Teeth, gums and palate normal. Oral mucosa normal. Neck:  supple Chest Wall:  pacemaker pocket is well healed Lungs:  CTAB Heart:  RRR, no m/r/g Abdomen:  Bowel sounds positive; abdomen soft  and non-tender without masses, organomegaly, or hernias noted. No hepatosplenomegaly. Msk:  Back normal, normal gait. Muscle strength and tone normal. Extremities:  No clubbing or cyanosis. Neurologic:  Alert and  oriented x 3.   Impression & Recommendations:  Problem # 1:  SINUS BRADYCARDIA (ICD-427.81) normal pacemaker function Upon previous wound check visit, she was noted to have significant artifact from her atrial lead.  Interrogation today suggests that this has resolved.  Problem # 2:  HYPERTENSION (ICD-401.9) above goal salt restriction  Problem # 3:  HYPERLIPIDEMIA (ICD-272.4) stable Her updated medication list for this problem includes:    Simvastatin 40 Mg Tabs (Simvastatin) .[name]... Take 1 tab by mouth at bedtime  Patient Instructions: 1)  Your physician recommends that you continue on your current medications as directed. Please refer to the Current Medication list given to you today. 2)  Your physician wants you to follow-up in:  October 2012 with Dr. Johney Frame. You will receive a reminder letter in the mail two months in advance. If you don't receive a letter, please call our office to schedule the follow-up appointment.       PPM Specifications Following MD:  Hillis Range, MD     Referring MD:  Rolla Plate PPM Vendor:  [employer]     PPM Model Number:  [id number]     PPM Serial Number:  [id number] [facility] PPM DOI:  02/11/2010     PPM Implanting MD:  Rolla Plate  Lead 1    Location: RA     DOI: 02/11/2010     Model #: [id number]     Serial #: [id number]     Status: active Lead 2    Location: RV     DOI: 02/11/2010     Model #: 4470     Serial #: [id number]     Status: active  Magnet Response Rate:  BOL 85 ERI 65  Indications:  Syncope   PPM Follow Up Battery Voltage:  2.79 V     Battery Est. Longevity:  13 yrs     Pacer Dependent:  No       PPM Device Measurements Atrium  Amplitude: 2.80 mV, Impedance: 517 ohms, Threshold: 0.50 V at 0.40 msec Right Ventricle  Amplitude: 22.40 mV, Impedance: 674 ohms, Threshold: 0.50 V at 0.40 msec  Episodes MS Episodes:  0     Coumadin:  No Ventricular High Rate:  0     Atrial Pacing:  21.3%     Ventricular Pacing:  3.2%  Parameters Mode:  DDD      Lower Rate Limit:  60     Upper Rate Limit:  130 Paced AV Delay:  180     Sensed AV Delay:  150 Next Cardiology Appt Due:  01/13/2011 Tech Comments:  NORMAL DEVICE FUNCTION.  NO EPISODES SINCE LAST CHECK.  CHANGED RA OUTPUT FROM 3.5 TO 2.0 AND RV OUTPUT FROM 3.5 TO 2.5 V.  ROV IN OCT 2012 W/JA. INSTRUCTED PT ON HOW TO USE  CARELINK. Vella Kohler  May 17, 2010 12:28 PM MD Comments:  Atrial signal artifact seen on last interrogation as resolved,  normal device function today

## 2010-05-30 NOTE — Cardiovascular Report (Signed)
Summary: Office Visit   Office Visit   Imported By: Roderic Ovens 05/22/2010 10:15:39  _____________________________________________________________________  External Attachment:    Type:   Image     Comment:   External Document

## 2010-06-26 LAB — CBC
HCT: 35.1 % — ABNORMAL LOW (ref 36.0–46.0)
MCH: 32.8 pg (ref 26.0–34.0)
MCHC: 34 g/dL (ref 30.0–36.0)
MCHC: 34.5 g/dL (ref 30.0–36.0)
MCV: 95 fL (ref 78.0–100.0)
Platelets: 199 10*3/uL (ref 150–400)
RDW: 13.6 % (ref 11.5–15.5)
RDW: 13.7 % (ref 11.5–15.5)
WBC: 5.8 10*3/uL (ref 4.0–10.5)
WBC: 6.2 10*3/uL (ref 4.0–10.5)

## 2010-06-26 LAB — BASIC METABOLIC PANEL
BUN: 19 mg/dL (ref 6–23)
CO2: 27 mEq/L (ref 19–32)
Chloride: 101 mEq/L (ref 96–112)
Glucose, Bld: 100 mg/dL — ABNORMAL HIGH (ref 70–99)
Potassium: 4.3 mEq/L (ref 3.5–5.1)

## 2010-06-26 LAB — COMPREHENSIVE METABOLIC PANEL
ALT: 17 U/L (ref 0–35)
Albumin: 3 g/dL — ABNORMAL LOW (ref 3.5–5.2)
Alkaline Phosphatase: 54 U/L (ref 39–117)
Calcium: 8.8 mg/dL (ref 8.4–10.5)
GFR calc Af Amer: >60 mL/min (ref >60)
Potassium: 4.4 mEq/L (ref 3.5–5.1)
Sodium: 136 mEq/L (ref 135–145)
Total Protein: 5.3 g/dL — ABNORMAL LOW (ref 6.0–8.3)

## 2010-06-26 LAB — URINALYSIS, ROUTINE W REFLEX MICROSCOPIC
Ketones, ur: NEGATIVE mg/dL
Nitrite: NEGATIVE
Protein, ur: NEGATIVE mg/dL
Urobilinogen, UA: 0.2 mg/dL (ref 0.0–1.0)

## 2010-06-26 LAB — POCT CARDIAC MARKERS
CKMB, poc: 1 ng/mL (ref 1.0–8.0)
Myoglobin, poc: 100 ng/mL (ref 12–200)
Troponin i, poc: <0.05 ng/mL (ref 0.00–0.09)

## 2010-06-26 LAB — DIFFERENTIAL
Basophils Absolute: 0 10*3/uL (ref 0.0–0.1)
Lymphocytes Relative: 26 % (ref 12–46)
Monocytes Absolute: 0.6 10*3/uL (ref 0.1–1.0)
Neutro Abs: 3.9 10*3/uL (ref 1.7–7.7)
Neutrophils Relative %: 62 % (ref 43–77)

## 2010-06-26 LAB — LIPID PANEL
HDL: 68 mg/dL (ref >39)
LDL Cholesterol: 69 mg/dL (ref 0–99)
Triglycerides: 78 mg/dL (ref <150)
VLDL: 16 mg/dL (ref 0–40)

## 2010-06-26 LAB — PROTIME-INR: Prothrombin Time: 13.8 seconds (ref 11.6–15.2)

## 2010-06-26 LAB — CARDIAC PANEL(CRET KIN+CKTOT+MB+TROPI)
CK, MB: 1.8 ng/mL (ref 0.3–4.0)
Relative Index: INVALID (ref 0.0–2.5)
Relative Index: INVALID (ref 0.0–2.5)
Total CK: 78 U/L (ref 7–177)
Troponin I: 0.03 ng/mL (ref 0.00–0.06)
Troponin I: 0.04 ng/mL (ref 0.00–0.06)

## 2010-06-26 LAB — APTT: aPTT: 33 seconds (ref 24–37)

## 2010-07-29 ENCOUNTER — Encounter: Payer: Self-pay | Admitting: Pulmonary Disease

## 2010-07-30 ENCOUNTER — Encounter: Payer: Self-pay | Admitting: Pulmonary Disease

## 2010-07-30 ENCOUNTER — Ambulatory Visit (INDEPENDENT_AMBULATORY_CARE_PROVIDER_SITE_OTHER): Payer: MEDICARE | Admitting: Pulmonary Disease

## 2010-07-30 DIAGNOSIS — M79609 Pain in unspecified limb: Secondary | ICD-10-CM

## 2010-07-30 DIAGNOSIS — Z950 Presence of cardiac pacemaker: Secondary | ICD-10-CM

## 2010-07-30 DIAGNOSIS — E039 Hypothyroidism, unspecified: Secondary | ICD-10-CM

## 2010-07-30 DIAGNOSIS — M549 Dorsalgia, unspecified: Secondary | ICD-10-CM

## 2010-07-30 DIAGNOSIS — M1990 Unspecified osteoarthritis, unspecified site: Secondary | ICD-10-CM

## 2010-07-30 DIAGNOSIS — D649 Anemia, unspecified: Secondary | ICD-10-CM

## 2010-07-30 DIAGNOSIS — I10 Essential (primary) hypertension: Secondary | ICD-10-CM

## 2010-07-30 DIAGNOSIS — I359 Nonrheumatic aortic valve disorder, unspecified: Secondary | ICD-10-CM

## 2010-07-30 DIAGNOSIS — M810 Age-related osteoporosis without current pathological fracture: Secondary | ICD-10-CM

## 2010-07-30 DIAGNOSIS — R7309 Other abnormal glucose: Secondary | ICD-10-CM

## 2010-07-30 DIAGNOSIS — E785 Hyperlipidemia, unspecified: Secondary | ICD-10-CM

## 2010-07-30 NOTE — Progress Notes (Signed)
Subjective:    Patient ID: Kayla Davis, female    DOB: 09-Oct-1922, 77 y.o.   MRN: [id number]  HPI 77 y/o WF here for a follow up visit... she has mult med problems including:  Recurrent bronchitic infections;  HBP;  Aortic valve dis;  Cardiac pacemaker;  Ven insuffic;  Hyperlipidemia;  DM;  Hypothyroid;  Divertics/ constip;  DJD/ LBP;  Osteopenia;  Hx TIA;  Periph neuropathy;  Anxiety;  Anemia; and hx Shingles...  ~  July 31, 2009:  she states that she feels OK overall, energy good (so she quit her iron Rx), but complains "my toes are stiff & red at night, not limber like my fingers"... she's had 2 shots in back from DrRamos w/ temp relief... she has mild AoV dis w/ thickened valve ?mild AS- needs f/u 2DEcho... BP controlled on DiovanHCT;  remains on Simva40+FishOil & due for FLP;  BS controlled on diet alone;  other problems as noted below...  ~  July 30, 2010:  [name] & she has had an eventful yr w/ [facility] 10/11 for presyncope w/ LBBB & pauses ==> pacer placed & she is now followed in the device clinic & saw DrAllred 2/12 doing satis;  She continues to complain about leg cramps- now mostly at night & we discussed Tonic water, Mustard, etc;  Her son brought her some "quinine pills" from ?[employer] (homeopathic under the tongue tab) & she will try this if she awakes w/ the discomfort;  She has f/u w/ DrRamos for epid shots (2/3 have helped)...  Otherw stable> BP controlled, no recent CP, due for f/u FLP & DM labs, etc (see below)>       Problem List:  HEARING LOSS (ICD-389.9) - she has bilat hearing aides.  BRONCHITIS, RECURRENT (ICD-491.9) - no recent resp tract infections...  HYPERTENSION (ICD-401.9) - controlled on DIOVAN/Hct 80-12.5 daily...  ~  4/12:  BP=120/58 and tol med well... she denies HA, fatigue, visual changes, CP, palipit, ch in dyspnea, edema, etc...  CHEST WALL PAIN, HX OF (ICD-V15.89)  AORTIC VALVE DISORDERS (ICD-424.1)- s/p cardiac work up in 2001... currently denies CP  etc... ~  NuclearStressTest 3/01 showed impaired exerc capacity & ST depression but no ischemia or infarction on images and EF=72%... ~  2DEcho 10/06 showed no regional wall motion abn, EF= 70%, mild incr AV thickness w/ reduced excursion, mild AI, sl decr RV function. ~  2DEcho 5/11 showed norm LV size & function, EF=55-60%, mild AI w/ sl thickening of valve, mild MR, mod TR, grI DD...  PACER for Pre-syncope>  [facility]osp 10/11 for presyncope w/ LBBB & pauses ==> pacer placed & she is now stable, asymptomatic, & followed in the device clinic by DrAllred...  VENOUS INSUFFICIENCY (ICD-459.81) - she has mod VV, VI, & chr leg symptoms... good arterial pulses distally, no ischemic symptoms.  HYPERLIPIDEMIA (ICD-272.4) - on SIMVASTATIN 40mg /d & she has stopped it on several occas due to leg discomfort to see if this would help- it has never lead to improved leg symptoms!... also takes FISH OIL daily. ~  FLP 4/08 on Simvastatin 40mg /d showed TChol 171, TG 88, HDL 58, LDL 96... ~  FLP 6/09 showed TChol 220, TG 98, HDL 58, LDL 143... rec to restart her Simvast40. ~  10/10: not fasting at present & we discussed f/u FLP on ret in 2011 ~  FLP 4/11 showed TChol 176, TG 81, HDL 65, LDL 95 ~  FLP 4/12 showed ==> pending  DIABETES MELLITUS, BORDERLINE (ICD-790.29) -  on diet alone...  ~  labs 4/08 w/ BS= 106... ~  labs 2/09 w/ BS= 170, HgA1c= 6.7.Marland KitchenMarland Kitchen rec to start Metformin 500mg /d but she declined, prefer diet... ~  labs 6/09 showed BS= 97, HgA1c= 6.6.Marland KitchenMarland Kitchen rec- continue diet Rx. ~  labs 3/10 showed BS= 129, A1c= 6.6 ~  labs 9/10 showed BS= 108 ~  labs 4/11 showed BS= 96 ~  Labs 4/12 showed ==> pending  HYPOTHYROIDISM (ICD-244.9) - on SYNTHROID 157mcg/d... ~  TSH 4/08 = 1.45... keep same dose. ~  TSH 2/09 = 0.17 on 115mcg/d dose... rec to decr to 158mcg/d... ~  TSH 6/09 = 6.20 on 121mcg/d dose... she wants ret to dose. ~  TSH 3/10 = 0.41 on 136mcg/d dose. ~  TSH 4/11 = 0.24 on 160mcg/d dose... She doesn't  want to decr the dose.  DIVERTICULOSIS OF COLON (ICD-562.10) & CONSTIPATION (ICD-564.00) - we discussed a regimen to prevent constipation & she uses MIRALAX Prn... ~  Flex Sig 6/96 by DrSam showed divertics, otherw normal left colon...  DEGENERATIVE JOINT DISEASE (ICD-715.90) - she takes Glucosamine/ Chondroitin... LOW BACK PAIN & ?Neurogenic claudication in legs> ~  she persisits w/ mult somatic complaints--- she states she has tried off Simvastatin, Lyrica, Fosamax & others but nothing helps and her symptoms persist... XRays showed mild DJD both hips & lower spine; & BMD 5/09 showed min osteopenia w/ TScores -0.0 in Spine & -1.4 in right FemNeck... she has tried Rx w/ Tramadol, Advil, Osteobiflex, heat, etc...  ~  eval by Ortho- DrRamos w/ LBP, multilevel DDD, spondylolisthesis L5-S1 & bilat leg pain> she's tried phys therapy, water exercises, meds (no benefit); epid steroid shots have helped some...  OSTEOPOROSIS (ICD-733.00) - on ALENDRONATE 70mg /wk, calcium, vitamins... ~  BMD here 5/09 showed TScores of 0.00 in Spine, and -1.4 in the right Fem Neck ~  BMD here 9/11 showed TScores of +0.8 in Spine, and -1.7 in the right [facility]... rec to continue same med.  Hx of TRANSIENT ISCHEMIC ATTACK (ICD-435.9) - she remains on ASA and has not had any new cerebral ischemic symptoms...  PERIPHERAL NEUROPATHY (ICD-356.9) - c/o chronic leg discomfort as noted... extensive work up in the past- DrStuckey 2001 w/ normal art dopplers; DrLove in 2001 w/ a neg NCV study;  DrAplington w/ meds and TENS trial (per her request- it helped her brother)... now DrRamos w/ PT, water exercises, and epid shots... tried Lyrica w/o benefit...  ANXIETY (ICD-300.00) - she declines anxiolytic Rx.  SHINGLES (ICD-053.9) - developed shingles Mar09 involving right sacral dermatomes w/ rash and pain in the perineal and buttock areas... treated via ER & several visits w/ NP w/ Valtrex, Pred, Lyrica, Vicodin... lesions resolved, some  persist post-herpetic neuralgia...  ANEMIA, MILD (ICD-285.9) ~  labs 9/09 showed Hg= 11.8.[name]. ~  labs 3/10 showed Hg= 11.6, Fe= 64 (sat=20%) ~  labs 4/11 showed = Hg= 11.4 ~  Labs 4/12 showed Hg ==> pending  HEALTH MAINTENANCE:  she had the 2010 Flu shot 9/10... PNEUMOVAX in 2006 age age 62...   Past Surgical History  Procedure Date  . Pacemaker insertion   . Appendectomy   . Cholecystectomy   . Tonsillectomy   . Vaginal hysterectomy     Outpatient Encounter Prescriptions as of 07/30/2010  Medication Sig Dispense Refill  . alendronate (FOSAMAX) 70 MG tablet Take 70 mg by mouth every 7 (seven) days. Take with a full glass of water on an empty stomach.       . Alpha-Lipoic Acid  200 MG CAPS Take 1 capsule by mouth daily.        [name] aspirin 325 MG tablet Take 325 mg by mouth daily.        . Calcium Carbonate-Vitamin D (CALTRATE 600+D) 600-400 MG-UNIT per tablet Take 1 tablet by mouth daily.        . cholecalciferol (VITAMIN D) 1000 UNITS tablet Take 1,000 Units by mouth daily.        [name] co-enzyme Q-10 30 MG capsule Take 30 mg by mouth daily.        . Cyanocobalamin (B-12) 2500 MCG TABS Take by mouth.        . docusate sodium (COLACE) 100 MG capsule Take 100 mg by mouth daily as needed.        . fish oil-omega-3 fatty acids 1000 MG capsule Take 1 g by mouth daily.        [name] levothyroxine (SYNTHROID, LEVOTHROID) 125 MCG tablet Take 125 mcg by mouth daily.        . polyethylene glycol (MIRALAX / GLYCOLAX) packet Take 17 g by mouth daily.        . Probiotic Product (PROBIOTIC FORMULA) CAPS Take 1 capsule by mouth daily.        . simvastatin (ZOCOR) 40 MG tablet Take 40 mg by mouth at bedtime.        . valsartan-hydrochlorothiazide (DIOVAN-HCT) 80-12.5 MG per tablet Take 1 tablet by mouth daily.        [name] glucose blood test strip 1 each by Other route as needed. Use as instructed         Allergies  Allergen Reactions  . Alprazolam     REACTION: eyes swell up  . Penicillins     REACTION:  swells up all over body, pt states she almost died  . Sulfonamide Derivatives     REACTION: itching    Review of Systems         See HPI - all other systems neg except as noted... The patient complains of decreased hearing, dyspnea on exertion, and difficulty walking.  The patient denies anorexia, fever, weight loss, weight gain, vision loss, hoarseness, chest pain, syncope, peripheral edema, prolonged cough, headaches, hemoptysis, abdominal pain, melena, hematochezia, severe indigestion/heartburn, hematuria, incontinence, muscle weakness, suspicious skin lesions, transient blindness, depression, unusual weight change, abnormal bleeding, enlarged lymph nodes, and angioedema.    Objective:   Physical Exam      WD, WN, 77 y/o WF in NAD;  VS- reviewed. GENERAL:  Alert & oriented; pleasant & cooperative... HEENT:  [place]/AT, EOM-full, PERRLA, EACs-clear (she has bilat hearing aides), TMs-wnl, NOSE-clear, THROAT-clear & wnl. NECK:  Supple w/ fair ROM; no JVD; normal carotid impulses w/o bruits; no thyromegaly or nodules palpated; no lymphadenopathy. CHEST:  Clear to P & A; without wheezes/ rales/ or rhonchi heard; pacer in right upper shoulder area. HEART:  Regular gr 2/6 SEM S4 without rubs... ABDOMEN:  Soft & nontender; normal bowel sounds; no organomegaly or masses detected. EXT: without deformities, mild arthritic changes; +varicose veins/ +venous insuffic/ no edema. NEURO:  CN's intact; no focal neuro deficits x decr sensation in LE's c/w periph neuropathy.  DERM:  No lesions noted; no rash etc...   Assessment & Plan:   HBP>  Controlled on Doivan/HCT;  Continue same...  CARDIAC>  Hx CWP, AoV dis, Pacer;  Stable now & doing well w/o symptoms;  She will continue f/u in Pacer clinic...  LIPIDS>  On Simva40 + CoQ10;  She will ret  for f/u FLP & we will notify her of the results...  DM>  On diet alone;  Labs have beed good & she will ret for fasting blood work later this  week...  HYPOTHYROID>  Stable on the Synth125 but TSH was sl over suppressed last yr;  We will recheck again & make recommendations when the labs return...  ORTHO>  She has DJD, LBP, leg pain> she continues f/u by DrRamos for shots prn, doesn't want pain meds, we reviewed her predicament...  Noct leg pain>  This seems like an additional prob & rec to try tonic water, mustard, & the homeopathic quinine prep that her son found for her to put under her tongue...  Other medical problems as noted.Marland KitchenMarland Kitchen

## 2010-07-30 NOTE — Patient Instructions (Signed)
Today we updated your med list in our EPIC system...    Continue your current meds the same...  For the leg cramps:  Try the TONIC water at bedtime, and the "mustard trick" as well;  If you wake up w/ the pain, try the under the tongue Quinine prep that your son found at [employer] & let me know how it works...  Please return to our lab on Friday morn for your follow up fasting blood work...    Then call the PHONE TREE in a few days for your results...    Dial [id number] & when prompted enter your patient number followed by the # symbol...    Your patient number is:  [id number]#  Stay as active as possible... Call for any questions... Please sched a follow up appt in 6 months.Marland KitchenMarland Kitchen

## 2010-08-01 ENCOUNTER — Other Ambulatory Visit (INDEPENDENT_AMBULATORY_CARE_PROVIDER_SITE_OTHER): Payer: MEDICARE

## 2010-08-01 DIAGNOSIS — N3000 Acute cystitis without hematuria: Secondary | ICD-10-CM

## 2010-08-01 DIAGNOSIS — E039 Hypothyroidism, unspecified: Secondary | ICD-10-CM

## 2010-08-01 DIAGNOSIS — E7800 Pure hypercholesterolemia, unspecified: Secondary | ICD-10-CM

## 2010-08-01 DIAGNOSIS — D649 Anemia, unspecified: Secondary | ICD-10-CM

## 2010-08-01 LAB — HEPATIC FUNCTION PANEL
ALT: 15 U/L (ref 0–35)
AST: 17 U/L (ref 0–37)
Albumin: 3.6 g/dL (ref 3.5–5.2)
Total Bilirubin: 0.7 mg/dL (ref 0.3–1.2)
Total Protein: 6 g/dL (ref 6.0–8.3)

## 2010-08-01 LAB — CBC WITH DIFFERENTIAL/PLATELET
Basophils Relative: 0.8 % (ref 0.0–3.0)
Eosinophils Relative: 3.9 % (ref 0.0–5.0)
Lymphocytes Relative: 34.6 % (ref 12.0–46.0)
MCV: 94.3 fl (ref 78.0–100.0)
Monocytes Absolute: 0.6 10*3/uL (ref 0.1–1.0)
Monocytes Relative: 8.2 % (ref 3.0–12.0)
Neutrophils Relative %: 52.5 % (ref 43.0–77.0)
Platelets: 205 10*3/uL (ref 150.0–400.0)
RBC: 3.62 Mil/uL — ABNORMAL LOW (ref 3.87–5.11)
WBC: 6.9 10*3/uL (ref 4.5–10.5)

## 2010-08-01 LAB — URINALYSIS, ROUTINE W REFLEX MICROSCOPIC
Bilirubin Urine: NEGATIVE
Hgb urine dipstick: NEGATIVE
Nitrite: NEGATIVE
Urobilinogen, UA: 0.2 (ref 0.0–1.0)
pH: 7 (ref 5.0–8.0)

## 2010-08-01 LAB — LIPID PANEL
Cholesterol: 189 mg/dL (ref 0–200)
LDL Cholesterol: 99 mg/dL (ref 0–99)
Triglycerides: 103 mg/dL (ref 0.0–149.0)
VLDL: 20.6 mg/dL (ref 0.0–40.0)

## 2010-08-01 LAB — BASIC METABOLIC PANEL
BUN: 19 mg/dL (ref 6–23)
CO2: 30 mEq/L (ref 19–32)
Calcium: 9.3 mg/dL (ref 8.4–10.5)
Chloride: 97 mEq/L (ref 96–112)
Creatinine, Ser: 0.6 mg/dL (ref 0.4–1.2)
GFR: 93.6 mL/min (ref >60.00)
Glucose, Bld: 89 mg/dL (ref 70–99)
Potassium: 5.1 mEq/L (ref 3.5–5.1)
Sodium: 133 mEq/L — ABNORMAL LOW (ref 135–145)

## 2010-08-01 LAB — TSH: TSH: 0.83 u[IU]/mL (ref 0.35–5.50)

## 2010-08-04 LAB — URINE CULTURE: Colony Count: >=100000

## 2010-08-12 ENCOUNTER — Telehealth: Payer: Self-pay | Admitting: *Deleted

## 2010-08-12 MED ORDER — CIPROFLOXACIN HCL 250 MG PO TABS: 250.0000 mg | ORAL_TABLET | Freq: Two times a day (BID) | ORAL | Status: AC

## 2010-08-12 MED ORDER — CARISOPRODOL 250 MG PO TABS: 250.0000 mg | ORAL_TABLET | Freq: Every day | ORAL | Status: DC

## 2010-08-12 NOTE — Telephone Encounter (Signed)
Pt returned my call about her lab results--she wanted to let SN know that the tonic water that she was to use for her leg cramps is not helping at all.  Using the tonic water is causing her to have to get up at night to use the bathroom.  SN please advise. thanks

## 2010-08-12 NOTE — Telephone Encounter (Signed)
Called and lmom for pt to call back---will document this in the result note once pt is notified of lab results and med sent to the pharmacy.  Per SN---+UTI and culture is sens ecoli.  Pt to take cipro 250mg   #14  1 po bid  And increase fluids, cranberry juice.

## 2010-08-12 NOTE — Telephone Encounter (Signed)
ATC pt's home # x [phone] - line busy.  WCB

## 2010-08-12 NOTE — Telephone Encounter (Signed)
Pt aware of rx for Soma and will call if this does not help. RX sent to [facility] on [address].

## 2010-08-12 NOTE — Telephone Encounter (Signed)
Per SN----try soma  250mg    #30   1 po qhs.  thanks

## 2010-08-28 ENCOUNTER — Encounter: Payer: Self-pay | Admitting: *Deleted

## 2010-08-30 NOTE — Assessment & Plan Note (Signed)
[facility]                               PULMONARY OFFICE NOTE   Kayla Davis, Kayla Davis                    MRN:          [id number]  DATE:12/26/2005                            DOB:          1923/01/09    HISTORY OF PRESENT ILLNESS:  The patient is an 77 year old white female  patient of Dr. Jodelle Green, who has a known history of hyperlipidemia,  hypertension and hypothyroidism.  The patient presents with a 1-day history  of some mild nasal congestion, left ear pain and intermittent dizziness.  The patient complains that she feels slightly unbalanced when she turns her  head or looks up and down.  She denies any chest pain, palpitations,  presyncopal or syncopal episodes, visual or speech changes.   PAST MEDICAL HISTORY:  Reviewed with the patient.  Unfortunately, her chart  was unavailable at today's visit.   CURRENT MEDICATIONS:  Reviewed with the patient.   PHYSICAL EXAMINATION:  The patient is a pleasant female.  She is afebrile, stable vital signs.  O2 saturation is 97% on room air.  HEENT:  PERRLA.  EOMI without nystagmus.  The posterior pharynx is clear.  EACs are clear.  TMs are normal.  NECK:  Supple without cervical adenopathy.  No JVD.  LUNGS:  Lung sounds are clear bilaterally.  CARDIAC:  S1, S2 without murmur or gallop.  ABDOMEN:  Soft.  EXTREMITIES:  Warm without any calf tenderness, cyanosis, clubbing or edema.  Equal strength.  NEUROLOGIC:  Alert and oriented x3.  Cranial nerves II-XII are intact.  No  focal deficits are detected.  Steady gait.  Head maneuvers without  reproducible symptoms.   IMPRESSION AND PLAN:  Suspected benign positional vertigo.  The patient is  to begin meclizine 25 mg 1/2 to a whole up to three times a day as needed.  A BMET and CBC are pending.  The patient will follow back up with Dr. Kriste Davis  as scheduled, or sooner if needed.                                   Rubye Oaks, NP          Kayla Cloud. Kriste Basque, MD   TP/MedQ  DD:  12/26/2005  DT:  12/28/2005  Job #:  [id number]

## 2010-10-11 ENCOUNTER — Telehealth: Payer: Self-pay | Admitting: Pulmonary Disease

## 2010-10-11 MED ORDER — CIPROFLOXACIN HCL 250 MG PO TABS: 250.0000 mg | ORAL_TABLET | Freq: Two times a day (BID) | ORAL | Status: AC

## 2010-10-11 NOTE — Telephone Encounter (Signed)
Pt aware of all recs and rx sent.[name], CMA

## 2010-10-11 NOTE — Telephone Encounter (Signed)
Called, spoke with pt.  She c/o urine smelling "awful" and is dark yellow and not clear like is normally is.  Onset x over 1 wk.  Denies pain when urinating, difficulty urinating, pain in sides or increased pain in lower back.  She has increased the amount of water she is drinking but still not helping.  Pt requesting abx.  [facility] [place].  Allergies verified.  Dr. Kriste Basque, pls advise. Thanks!   Allergies  Allergen Reactions  . Alprazolam     REACTION: eyes swell up  . Penicillins     REACTION: swells up all over body, pt states she almost died  . Sulfonamide Derivatives     REACTION: itching

## 2010-10-11 NOTE — Telephone Encounter (Signed)
Sounds like UTI Last UTI 07/2010 that was pansensitive rec Cipro 250mg  Twice daily  For 7 days #14 , no refills   fluids and rest  Urinary hygiene measures  Please contact office for sooner follow up if symptoms do not improve or worsen or seek emergency care  If she gets another UTI will need to come in with sample

## 2010-11-15 ENCOUNTER — Emergency Department (HOSPITAL_COMMUNITY): Payer: MEDICARE

## 2010-11-15 ENCOUNTER — Emergency Department (HOSPITAL_COMMUNITY)
Admission: EM | Admit: 2010-11-15 | Discharge: 2010-11-16 | Disposition: A | Discharge status: Home / Self Care | Payer: MEDICARE | Attending: Emergency Medicine | Admitting: Emergency Medicine

## 2010-11-15 DIAGNOSIS — I10 Essential (primary) hypertension: Secondary | ICD-10-CM | POA: Insufficient documentation

## 2010-11-15 DIAGNOSIS — E871 Hypo-osmolality and hyponatremia: Secondary | ICD-10-CM | POA: Insufficient documentation

## 2010-11-15 DIAGNOSIS — R509 Fever, unspecified: Secondary | ICD-10-CM | POA: Insufficient documentation

## 2010-11-15 DIAGNOSIS — E119 Type 2 diabetes mellitus without complications: Secondary | ICD-10-CM | POA: Insufficient documentation

## 2010-11-15 DIAGNOSIS — G589 Mononeuropathy, unspecified: Secondary | ICD-10-CM | POA: Insufficient documentation

## 2010-11-15 DIAGNOSIS — R059 Cough, unspecified: Secondary | ICD-10-CM | POA: Insufficient documentation

## 2010-11-15 DIAGNOSIS — R05 Cough: Secondary | ICD-10-CM | POA: Insufficient documentation

## 2010-11-15 DIAGNOSIS — J189 Pneumonia, unspecified organism: Secondary | ICD-10-CM | POA: Insufficient documentation

## 2010-11-15 DIAGNOSIS — R5381 Other malaise: Secondary | ICD-10-CM | POA: Insufficient documentation

## 2010-11-15 DIAGNOSIS — E039 Hypothyroidism, unspecified: Secondary | ICD-10-CM | POA: Insufficient documentation

## 2010-11-15 DIAGNOSIS — E785 Hyperlipidemia, unspecified: Secondary | ICD-10-CM | POA: Insufficient documentation

## 2010-11-15 DIAGNOSIS — R5383 Other fatigue: Secondary | ICD-10-CM | POA: Insufficient documentation

## 2010-11-15 LAB — COMPREHENSIVE METABOLIC PANEL
ALT: 27 U/L (ref 0–35)
AST: 29 U/L (ref 0–37)
Albumin: 3.4 g/dL — ABNORMAL LOW (ref 3.5–5.2)
Alkaline Phosphatase: 77 U/L (ref 39–117)
CO2: 20 mEq/L (ref 19–32)
Chloride: 92 mEq/L — ABNORMAL LOW (ref 96–112)
GFR calc non Af Amer: >60 mL/min (ref >60)
Potassium: 3.5 mEq/L (ref 3.5–5.1)
Sodium: 126 mEq/L — ABNORMAL LOW (ref 135–145)
Total Bilirubin: 0.7 mg/dL (ref 0.3–1.2)

## 2010-11-15 LAB — DIFFERENTIAL
Basophils Absolute: 0 10*3/uL (ref 0.0–0.1)
Eosinophils Absolute: 0 10*3/uL (ref 0.0–0.7)
Eosinophils Relative: 0 % (ref 0–5)
Lymphocytes Relative: 6 % — ABNORMAL LOW (ref 12–46)
Lymphs Abs: 0.7 10*3/uL (ref 0.7–4.0)
Neutrophils Relative %: 90 % — ABNORMAL HIGH (ref 43–77)

## 2010-11-15 LAB — CBC
MCV: 91.5 fL (ref 78.0–100.0)
Platelets: 197 10*3/uL (ref 150–400)
RBC: 3.76 MIL/uL — ABNORMAL LOW (ref 3.87–5.11)
RDW: 12.8 % (ref 11.5–15.5)
WBC: 11.4 10*3/uL — ABNORMAL HIGH (ref 4.0–10.5)

## 2010-11-20 ENCOUNTER — Encounter: Payer: Self-pay | Admitting: Adult Health

## 2010-11-20 ENCOUNTER — Ambulatory Visit (INDEPENDENT_AMBULATORY_CARE_PROVIDER_SITE_OTHER): Payer: MEDICARE | Admitting: Adult Health

## 2010-11-20 ENCOUNTER — Other Ambulatory Visit: Payer: MEDICARE

## 2010-11-20 ENCOUNTER — Other Ambulatory Visit (INDEPENDENT_AMBULATORY_CARE_PROVIDER_SITE_OTHER): Payer: MEDICARE

## 2010-11-20 DIAGNOSIS — R7309 Other abnormal glucose: Secondary | ICD-10-CM

## 2010-11-20 DIAGNOSIS — D649 Anemia, unspecified: Secondary | ICD-10-CM

## 2010-11-20 DIAGNOSIS — J189 Pneumonia, unspecified organism: Secondary | ICD-10-CM

## 2010-11-20 DIAGNOSIS — M549 Dorsalgia, unspecified: Secondary | ICD-10-CM

## 2010-11-20 LAB — URINALYSIS, ROUTINE W REFLEX MICROSCOPIC
Bilirubin Urine: NEGATIVE
Ketones, ur: NEGATIVE
Leukocytes, UA: NEGATIVE
Nitrite: NEGATIVE
Specific Gravity, Urine: 1.01 (ref 1.000–1.030)
Urobilinogen, UA: 0.2 (ref 0.0–1.0)

## 2010-11-20 LAB — BASIC METABOLIC PANEL
BUN: 16 mg/dL (ref 6–23)
Calcium: 9.2 mg/dL (ref 8.4–10.5)
Creatinine, Ser: 0.9 mg/dL (ref 0.4–1.2)
GFR: 63.93 mL/min (ref >60.00)
Glucose, Bld: 82 mg/dL (ref 70–99)
Potassium: 4.6 mEq/L (ref 3.5–5.1)

## 2010-11-20 LAB — HEMOGLOBIN A1C: Hgb A1c MFr Bld: 6.6 % — ABNORMAL HIGH (ref 4.6–6.5)

## 2010-11-20 LAB — CBC WITH DIFFERENTIAL/PLATELET
Basophils Absolute: 0.1 10*3/uL (ref 0.0–0.1)
HCT: 36.3 % (ref 36.0–46.0)
Lymphs Abs: 2.9 10*3/uL (ref 0.7–4.0)
Monocytes Relative: 10 % (ref 3.0–12.0)
Neutrophils Relative %: 47.6 % (ref 43.0–77.0)
Platelets: 274 10*3/uL (ref 150.0–400.0)
RDW: 13.2 % (ref 11.5–14.6)
WBC: 7.9 10*3/uL (ref 4.5–10.5)

## 2010-11-20 NOTE — Progress Notes (Deleted)
  Subjective:    Patient ID: Kayla Davis, female    DOB: [birth year], 77 y.o.   MRN: [id number]  HPI    Review of Systems     Objective:   Physical Exam        Assessment & Plan:

## 2010-11-20 NOTE — Patient Instructions (Addendum)
Finish Avelox as directed.  Take antbiotics with food.  Increase fluids and rest.  Mucinex DM Twice daily  As needed  Cough/congestion  Change positions slowly  Hold multivitamins and fosamax for 2 week  I will call with labs.  Follow up in 1 week and As needed

## 2010-11-22 ENCOUNTER — Inpatient Hospital Stay: Payer: MEDICARE | Admitting: Adult Health

## 2010-11-22 ENCOUNTER — Telehealth: Payer: Self-pay | Admitting: Pulmonary Disease

## 2010-11-22 NOTE — Telephone Encounter (Signed)
Pt aware of lab results per result note.

## 2010-11-23 LAB — URINE CULTURE

## 2010-11-25 NOTE — Assessment & Plan Note (Signed)
RLL PNA -slowly improving on abx. Advised on taking meds with food.  Fluids and rest.  NA was low in ER , will recheck   Plan:  Finish Avelox as directed.  Take antbiotics with food.  Increase fluids and rest.  Mucinex DM Twice daily  As needed  Cough/congestion  Change positions slowly  Hold multivitamins and fosamax for 2 week  I will call with labs.  Follow up in 1 week and As needed

## 2010-11-25 NOTE — Progress Notes (Signed)
Subjective:    Patient ID: Kayla Davis, female    DOB: 10/11/1922, 77 y.o.   MRN: [id number]  HPI 77 y/o WF here for a follow up visit... she has mult med problems including:  Recurrent bronchitic infections;  HBP;  Aortic valve dis;  Cardiac pacemaker;  Ven insuffic;  Hyperlipidemia;  DM;  Hypothyroid;  Divertics/ constip;  DJD/ LBP;  Osteopenia;  Hx TIA;  Periph neuropathy;  Anxiety;  Anemia; and hx Shingles...  ~  July 31, 2009:  she states that she feels OK overall, energy good (so she quit her iron Rx), but complains "my toes are stiff & red at night, not limber like my fingers"... she's had 2 shots in back from DrRamos w/ temp relief... she has mild AoV dis w/ thickened valve ?mild AS- needs f/u 2DEcho... BP controlled on DiovanHCT;  remains on Simva40+FishOil & due for FLP;  BS controlled on diet alone;  other problems as noted below...  ~  July 30, 2010:  [name] & she has had an eventful yr w/ [facility] 10/11 for presyncope w/ LBBB & pauses ==> pacer placed & she is now followed in the device clinic & saw DrAllred 2/12 doing satis;  She continues to complain about leg cramps- now mostly at night & we discussed Tonic water, Mustard, etc;  Her son brought her some "quinine pills" from ?[employer] (homeopathic under the tongue tab) & she will try this if she awakes w/ the discomfort;  She has f/u w/ DrRamos for epid shots (2/3 have helped)...  Otherw stable> BP controlled, no recent CP, due for f/u FLP & DM labs, etc (see below)>  ~11/20/10 ER follow up  Pt presents for ER follow up . She was seen 11/15/10 for RLL PNA. Seen 5 days ago with cough, congestion. Dx with  PNA-CXR showed RLL infiltrate. She  was given avelox 40mg  x7days.  She is feeling better but reports still weak, having dizziness x2days but has been taking the abx on an empty stomach. She says she did not know to take meds with food. Appetite is low. Cough and congestion are better.  No chest pain or edema. No hemoptysis. No vomiting.         Problem List:  HEARING LOSS (ICD-389.9) - she has bilat hearing aides.  BRONCHITIS, RECURRENT (ICD-491.9) - no recent resp tract infections...  HYPERTENSION (ICD-401.9) - controlled on DIOVAN/Hct 80-12.5 daily...  ~  4/12:  BP=120/58 and tol med well... she denies HA, fatigue, visual changes, CP, palipit, ch in dyspnea, edema, etc...  CHEST WALL PAIN, HX OF (ICD-V15.89)  AORTIC VALVE DISORDERS (ICD-424.1)- s/p cardiac work up in 2001... currently denies CP etc... ~  NuclearStressTest 3/01 showed impaired exerc capacity & ST depression but no ischemia or infarction on images and EF=72%... ~  2DEcho 10/06 showed no regional wall motion abn, EF= 70%, mild incr AV thickness w/ reduced excursion, mild AI, sl decr RV function. ~  2DEcho 5/11 showed norm LV size & function, EF=55-60%, mild AI w/ sl thickening of valve, mild MR, mod TR, grI DD...  PACER for Pre-syncope>  Hosp 10/11 for presyncope w/ LBBB & pauses ==> pacer placed & she is now stable, asymptomatic, & followed in the device clinic by DrAllred...  VENOUS INSUFFICIENCY (ICD-459.81) - she has mod VV, VI, & chr leg symptoms... good arterial pulses distally, no ischemic symptoms.  HYPERLIPIDEMIA (ICD-272.4) - on SIMVASTATIN 40mg /d & she has stopped it on several occas due to leg discomfort to  see if this would help- it has never lead to improved leg symptoms!... also takes FISH OIL daily. ~  FLP 4/08 on Simvastatin 40mg /d showed TChol 171, TG 88, HDL 58, LDL 96... ~  FLP 6/09 showed TChol 220, TG 98, HDL 58, LDL 143... rec to restart her Simvast40. ~  10/10: not fasting at present & we discussed f/u FLP on ret in 2011 ~  FLP 4/11 showed TChol 176, TG 81, HDL 65, LDL 95 ~  FLP 4/12 showed ==> pending  DIABETES MELLITUS, BORDERLINE (ICD-790.29) - on diet alone...  ~  labs 4/08 w/ BS= 106... ~  labs 2/09 w/ BS= 170, HgA1c= 6.7.Marland KitchenMarland Kitchen rec to start Metformin 500mg /d but she declined, prefer diet... ~  labs 6/09 showed BS= 97, HgA1c=  6.6.Marland KitchenMarland Kitchen rec- continue diet Rx. ~  labs 3/10 showed BS= 129, A1c= 6.6 ~  labs 9/10 showed BS= 108 ~  labs 4/11 showed BS= 96 ~  Labs 4/12 showed ==> pending  HYPOTHYROIDISM (ICD-244.9) - on SYNTHROID 172mcg/d... ~  TSH 4/08 = 1.45... keep same dose. ~  TSH 2/09 = 0.17 on 148mcg/d dose... rec to decr to 181mcg/d... ~  TSH 6/09 = 6.20 on 139mcg/d dose... she wants ret to dose. ~  TSH 3/10 = 0.41 on 174mcg/d dose. ~  TSH 4/11 = 0.24 on 166mcg/d dose... She doesn't want to decr the dose.  DIVERTICULOSIS OF COLON (ICD-562.10) & CONSTIPATION (ICD-564.00) - we discussed a regimen to prevent constipation & she uses MIRALAX Prn... ~  Flex Sig 6/96 by DrSam showed divertics, otherw normal left colon...  DEGENERATIVE JOINT DISEASE (ICD-715.90) - she takes Glucosamine/ Chondroitin... LOW BACK PAIN & ?Neurogenic claudication in legs> ~  she persisits w/ mult somatic complaints--- she states she has tried off Simvastatin, Lyrica, Fosamax & others but nothing helps and her symptoms persist... XRays showed mild DJD both hips & lower spine; & BMD 5/09 showed min osteopenia w/ TScores -0.0 in Spine & -1.4 in right FemNeck... she has tried Rx w/ Tramadol, Advil, Osteobiflex, heat, etc...  ~  eval by Ortho- DrRamos w/ LBP, multilevel DDD, spondylolisthesis L5-S1 & bilat leg pain> she's tried phys therapy, water exercises, meds (no benefit); epid steroid shots have helped some...  OSTEOPOROSIS (ICD-733.00) - on ALENDRONATE 70mg /wk, calcium, vitamins... ~  BMD here 5/09 showed TScores of 0.00 in Spine, and -1.4 in the right Fem Neck ~  BMD here 9/11 showed TScores of +0.8 in Spine, and -1.7 in the right [facility]... rec to continue same med.  Hx of TRANSIENT ISCHEMIC ATTACK (ICD-435.9) - she remains on ASA and has not had any new cerebral ischemic symptoms...  PERIPHERAL NEUROPATHY (ICD-356.9) - c/o chronic leg discomfort as noted... extensive work up in the past- DrStuckey 2001 w/ normal art dopplers;  DrLove in 2001 w/ a neg NCV study;  DrAplington w/ meds and TENS trial (per her request- it helped her brother)... now DrRamos w/ PT, water exercises, and epid shots... tried Lyrica w/o benefit...  ANXIETY (ICD-300.00) - she declines anxiolytic Rx.  SHINGLES (ICD-053.9) - developed shingles Mar09 involving right sacral dermatomes w/ rash and pain in the perineal and buttock areas... treated via ER & several visits w/ NP w/ Valtrex, Pred, Lyrica, Vicodin... lesions resolved, some persist post-herpetic neuralgia...  ANEMIA, MILD (ICD-285.9) ~  labs 9/09 showed Hg= 11.8.[name]. ~  labs 3/10 showed Hg= 11.6, Fe= 64 (sat=20%) ~  labs 4/11 showed = Hg= 11.4 ~  Labs 4/12 showed Hg ==> pending  HEALTH MAINTENANCE:  she had the 2010 Flu shot 9/10... PNEUMOVAX in 2006 age age 36...   Past Surgical History  Procedure Date  . Pacemaker insertion   . Appendectomy   . Cholecystectomy   . Tonsillectomy   . Vaginal hysterectomy     Outpatient Encounter Prescriptions as of 07/30/2010  Medication Sig Dispense Refill  . alendronate (FOSAMAX) 70 MG tablet Take 70 mg by mouth every 7 (seven) days. Take with a full glass of water on an empty stomach.       . Alpha-Lipoic Acid 200 MG CAPS Take 1 capsule by mouth daily.        [name] aspirin 325 MG tablet Take 325 mg by mouth daily.        . Calcium Carbonate-Vitamin D (CALTRATE 600+D) 600-400 MG-UNIT per tablet Take 1 tablet by mouth daily.        . cholecalciferol (VITAMIN D) 1000 UNITS tablet Take 1,000 Units by mouth daily.        [name] co-enzyme Q-10 30 MG capsule Take 30 mg by mouth daily.        . Cyanocobalamin (B-12) 2500 MCG TABS Take by mouth.        . docusate sodium (COLACE) 100 MG capsule Take 100 mg by mouth daily as needed.        . fish oil-omega-3 fatty acids 1000 MG capsule Take 1 g by mouth daily.        [name] levothyroxine (SYNTHROID, LEVOTHROID) 125 MCG tablet Take 125 mcg by mouth daily.        . polyethylene glycol (MIRALAX / GLYCOLAX) packet Take  17 g by mouth daily.        . Probiotic Product (PROBIOTIC FORMULA) CAPS Take 1 capsule by mouth daily.        . simvastatin (ZOCOR) 40 MG tablet Take 40 mg by mouth at bedtime.        . valsartan-hydrochlorothiazide (DIOVAN-HCT) 80-12.5 MG per tablet Take 1 tablet by mouth daily.        [name] glucose blood test strip 1 each by Other route as needed. Use as instructed         Allergies  Allergen Reactions  . Alprazolam     REACTION: eyes swell up  . Penicillins     REACTION: swells up all over body, pt states she almost died  . Sulfonamide Derivatives     REACTION: itching    Review of Systems  Constitutional:   No  weight loss, night sweats,  Fevers, chills, fatigue, or  lassitude.  HEENT:   No headaches,  Difficulty swallowing,  Tooth/dental problems, or  Sore throat,                No sneezing, itching, ear ache, nasal congestion, post nasal drip,   CV:  No chest pain,  Orthopnea, PND, swelling in lower extremities, anasarca, dizziness, palpitations, syncope.   GI  No heartburn, indigestion, abdominal pain, nausea, vomiting, diarrhea, change in bowel habits, loss of appetite, bloody stools.   Resp:  No coughing up of blood.  No chest wall deformity  Skin: no rash or lesions.  GU: no dysuria, change in color of urine, no urgency or frequency.  No flank pain, no hematuria   MS:  No joint pain or swelling.  No decreased range of motion.  No back pain.  Psych:  No change in mood or affect. No depression or anxiety.  No memory loss.        Objective:  Physical Exam      WD, WN, 77 y/o WF in NAD;  VS- reviewed. GENERAL:  Alert & oriented; pleasant & cooperative... HEENT:  [place]/AT, EOM-full, PERRLA, EACs-clear (she has bilat hearing aides), TMs-wnl, NOSE-clear, THROAT-clear & wnl. NECK:  Supple w/ fair ROM; no JVD; normal carotid impulses w/o bruits; no thyromegaly or nodules palpated; no lymphadenopathy. CHEST:  Clear to P & A; without wheezes/ rales/ or rhonchi heard;  pacer in right upper shoulder area. HEART:  Regular gr 2/6 SEM S4 without rubs... ABDOMEN:  Soft & nontender; normal bowel sounds; no organomegaly or masses detected. EXT: without deformities, mild arthritic changes; +varicose veins/ +venous insuffic/ no edema. NEURO:  CN's intact; no focal neuro deficits  , MAEWx 4 , nml gait. nml grips bilaterally, neg pronator drift.  DERM:  No lesions noted; no rash etc...   Assessment & Plan:

## 2010-11-27 ENCOUNTER — Encounter: Payer: Self-pay | Admitting: Adult Health

## 2010-11-27 ENCOUNTER — Ambulatory Visit (INDEPENDENT_AMBULATORY_CARE_PROVIDER_SITE_OTHER)
Admission: RE | Admit: 2010-11-27 | Discharge: 2010-11-27 | Disposition: A | Discharge status: Home / Self Care | Payer: MEDICARE | Source: Ambulatory Visit | Attending: Adult Health | Admitting: Adult Health

## 2010-11-27 ENCOUNTER — Telehealth: Payer: Self-pay | Admitting: Adult Health

## 2010-11-27 ENCOUNTER — Other Ambulatory Visit (INDEPENDENT_AMBULATORY_CARE_PROVIDER_SITE_OTHER): Payer: MEDICARE

## 2010-11-27 ENCOUNTER — Ambulatory Visit (INDEPENDENT_AMBULATORY_CARE_PROVIDER_SITE_OTHER): Payer: MEDICARE | Admitting: Adult Health

## 2010-11-27 VITALS — BP 116/64 | HR 73 | Temp 96.7°F | Ht 65.0 in | Wt 154.4 lb

## 2010-11-27 DIAGNOSIS — J189 Pneumonia, unspecified organism: Secondary | ICD-10-CM

## 2010-11-27 DIAGNOSIS — M549 Dorsalgia, unspecified: Secondary | ICD-10-CM

## 2010-11-27 DIAGNOSIS — R300 Dysuria: Secondary | ICD-10-CM

## 2010-11-27 LAB — URINALYSIS, ROUTINE W REFLEX MICROSCOPIC
Leukocytes, UA: NEGATIVE
Nitrite: NEGATIVE
Urobilinogen, UA: 0.2 (ref 0.0–1.0)

## 2010-11-27 NOTE — Patient Instructions (Signed)
Continue on current regimen Follow up Dr. Kriste Basque  As planned in 2 months Empty bladder frequently and completely, cut back on fluid intake after dinner time.  Check on flu shot after Labor Day.  I will call with labs and xray results.

## 2010-11-27 NOTE — Telephone Encounter (Signed)
Called and spoke with pt.  Pt is requesting cxr results that were done today 8/15.  TP, please advise.  Thanks.

## 2010-11-27 NOTE — Progress Notes (Signed)
Subjective:    Patient ID: Kayla Davis, female    DOB: March 22, 1923, 77 y.o.   MRN: [id number]  HPI 77 y/o WF here for a follow up visit... she has mult med problems including:  Recurrent bronchitic infections;  HBP;  Aortic valve dis;  Cardiac pacemaker;  Ven insuffic;  Hyperlipidemia;  DM;  Hypothyroid;  Divertics/ constip;  DJD/ LBP;  Osteopenia;  Hx TIA;  Periph neuropathy;  Anxiety;  Anemia; and hx Shingles...  ~  July 31, 2009:  she states that she feels OK overall, energy good (so she quit her iron Rx), but complains "my toes are stiff & red at night, not limber like my fingers"... she's had 2 shots in back from DrRamos w/ temp relief... she has mild AoV dis w/ thickened valve ?mild AS- needs f/u 2DEcho... BP controlled on DiovanHCT;  remains on Simva40+FishOil & due for FLP;  BS controlled on diet alone;  other problems as noted below...  ~  July 30, 2010:  [name] & she has had an eventful yr w/ [facility] 10/11 for presyncope w/ LBBB & pauses ==> pacer placed & she is now followed in the device clinic & saw DrAllred 2/12 doing satis;  She continues to complain about leg cramps- now mostly at night & we discussed Tonic water, Mustard, etc;  Her son brought her some "quinine pills" from ?[employer] (homeopathic under the tongue tab) & she will try this if she awakes w/ the discomfort;  She has f/u w/ DrRamos for epid shots (2/3 have helped)...  Otherw stable> BP controlled, no recent CP, due for f/u FLP & DM labs, etc (see below)>  ~11/20/10 ER follow up  Pt presents for ER follow up . She was seen 11/15/10 for RLL PNA. Seen 5 days ago with cough, congestion. Dx with  PNA-CXR showed RLL infiltrate. She  was given avelox 40mg  x7days.  She is feeling better but reports still weak, having dizziness x2days but has been taking the abx on an empty stomach. She says she did not know to take meds with food. Appetite is low. Cough and congestion are better.  No chest pain or edema. No hemoptysis. No vomiting.  >>cont avelox to finish  11/27/2010 Follow up  Pt returns for follow up. She has completed full course of AVelox for RLL PNA. She is feeling better. Reports breathing has improved.  c/o occasional urinary incontinence at night and when getting up from low/soft furniture x4months. URine last ov showed co-ox neg staph.  No dysuria or hematuria.        Problem List:  HEARING LOSS (ICD-389.9) - she has bilat hearing aides.  BRONCHITIS, RECURRENT (ICD-491.9) - no recent resp tract infections...  HYPERTENSION (ICD-401.9) - controlled on DIOVAN/Hct 80-12.5 daily...  ~  4/12:  BP=120/58 and tol med well... she denies HA, fatigue, visual changes, CP, palipit, ch in dyspnea, edema, etc...  CHEST WALL PAIN, HX OF (ICD-V15.89)  AORTIC VALVE DISORDERS (ICD-424.1)- s/p cardiac work up in 2001... currently denies CP etc... ~  NuclearStressTest 3/01 showed impaired exerc capacity & ST depression but no ischemia or infarction on images and EF=72%... ~  2DEcho 10/06 showed no regional wall motion abn, EF= 70%, mild incr AV thickness w/ reduced excursion, mild AI, sl decr RV function. ~  2DEcho 5/11 showed norm LV size & function, EF=55-60%, mild AI w/ sl thickening of valve, mild MR, mod TR, grI DD...  PACER for Pre-syncope>  Hosp 10/11 for presyncope w/ LBBB &  pauses ==> pacer placed & she is now stable, asymptomatic, & followed in the device clinic by DrAllred...  VENOUS INSUFFICIENCY (ICD-459.81) - she has mod VV, VI, & chr leg symptoms... good arterial pulses distally, no ischemic symptoms.  HYPERLIPIDEMIA (ICD-272.4) - on SIMVASTATIN 40mg /d & she has stopped it on several occas due to leg discomfort to see if this would help- it has never lead to improved leg symptoms!... also takes FISH OIL daily. ~  FLP 4/08 on Simvastatin 40mg /d showed TChol 171, TG 88, HDL 58, LDL 96... ~  FLP 6/09 showed TChol 220, TG 98, HDL 58, LDL 143... rec to restart her Simvast40. ~  10/10: not fasting at present & we  discussed f/u FLP on ret in 2011 ~  FLP 4/11 showed TChol 176, TG 81, HDL 65, LDL 95 ~  FLP 4/12 showed ==> pending  DIABETES MELLITUS, BORDERLINE (ICD-790.29) - on diet alone...  ~  labs 4/08 w/ BS= 106... ~  labs 2/09 w/ BS= 170, HgA1c= 6.7.Marland KitchenMarland Kitchen rec to start Metformin 500mg /d but she declined, prefer diet... ~  labs 6/09 showed BS= 97, HgA1c= 6.6.Marland KitchenMarland Kitchen rec- continue diet Rx. ~  labs 3/10 showed BS= 129, A1c= 6.6 ~  labs 9/10 showed BS= 108 ~  labs 4/11 showed BS= 96 ~  Labs 4/12 showed ==> pending  HYPOTHYROIDISM (ICD-244.9) - on SYNTHROID 152mcg/d... ~  TSH 4/08 = 1.45... keep same dose. ~  TSH 2/09 = 0.17 on 160mcg/d dose... rec to decr to 144mcg/d... ~  TSH 6/09 = 6.20 on 155mcg/d dose... she wants ret to dose. ~  TSH 3/10 = 0.41 on 114mcg/d dose. ~  TSH 4/11 = 0.24 on 135mcg/d dose... She doesn't want to decr the dose.  DIVERTICULOSIS OF COLON (ICD-562.10) & CONSTIPATION (ICD-564.00) - we discussed a regimen to prevent constipation & she uses MIRALAX Prn... ~  Flex Sig 6/96 by DrSam showed divertics, otherw normal left colon...  DEGENERATIVE JOINT DISEASE (ICD-715.90) - she takes Glucosamine/ Chondroitin... LOW BACK PAIN & ?Neurogenic claudication in legs> ~  she persisits w/ mult somatic complaints--- she states she has tried off Simvastatin, Lyrica, Fosamax & others but nothing helps and her symptoms persist... XRays showed mild DJD both hips & lower spine; & BMD 5/09 showed min osteopenia w/ TScores -0.0 in Spine & -1.4 in right FemNeck... she has tried Rx w/ Tramadol, Advil, Osteobiflex, heat, etc...  ~  eval by Ortho- DrRamos w/ LBP, multilevel DDD, spondylolisthesis L5-S1 & bilat leg pain> she's tried phys therapy, water exercises, meds (no benefit); epid steroid shots have helped some...  OSTEOPOROSIS (ICD-733.00) - on ALENDRONATE 70mg /wk, calcium, vitamins... ~  BMD here 5/09 showed TScores of 0.00 in Spine, and -1.4 in the right Fem Neck ~  BMD here 9/11 showed TScores  of +0.8 in Spine, and -1.7 in the right [facility]... rec to continue same med.  Hx of TRANSIENT ISCHEMIC ATTACK (ICD-435.9) - she remains on ASA and has not had any new cerebral ischemic symptoms...  PERIPHERAL NEUROPATHY (ICD-356.9) - c/o chronic leg discomfort as noted... extensive work up in the past- DrStuckey 2001 w/ normal art dopplers; DrLove in 2001 w/ a neg NCV study;  DrAplington w/ meds and TENS trial (per her request- it helped her brother)... now DrRamos w/ PT, water exercises, and epid shots... tried Lyrica w/o benefit...  ANXIETY (ICD-300.00) - she declines anxiolytic Rx.  SHINGLES (ICD-053.9) - developed shingles Mar09 involving right sacral dermatomes w/ rash and pain in the perineal and buttock areas.Marland KitchenMarland Kitchen  treated via ER & several visits w/ NP w/ Valtrex, Pred, Lyrica, Vicodin... lesions resolved, some persist post-herpetic neuralgia...  ANEMIA, MILD (ICD-285.9) ~  labs 9/09 showed Hg= 11.8.[name]. ~  labs 3/10 showed Hg= 11.6, Fe= 64 (sat=20%) ~  labs 4/11 showed = Hg= 11.4 ~  Labs 4/12 showed Hg ==> pending  HEALTH MAINTENANCE:  she had the 2010 Flu shot 9/10... PNEUMOVAX in 2006 age age 59...   Past Surgical History  Procedure Date  . Pacemaker insertion   . Appendectomy   . Cholecystectomy   . Tonsillectomy   . Vaginal hysterectomy     Outpatient Encounter Prescriptions as of 07/30/2010  Medication Sig Dispense Refill  . alendronate (FOSAMAX) 70 MG tablet Take 70 mg by mouth every 7 (seven) days. Take with a full glass of water on an empty stomach.       . Alpha-Lipoic Acid 200 MG CAPS Take 1 capsule by mouth daily.        [name] aspirin 325 MG tablet Take 325 mg by mouth daily.        . Calcium Carbonate-Vitamin D (CALTRATE 600+D) 600-400 MG-UNIT per tablet Take 1 tablet by mouth daily.        . cholecalciferol (VITAMIN D) 1000 UNITS tablet Take 1,000 Units by mouth daily.        [name] co-enzyme Q-10 30 MG capsule Take 30 mg by mouth daily.        . Cyanocobalamin (B-12) 2500  MCG TABS Take by mouth.        . docusate sodium (COLACE) 100 MG capsule Take 100 mg by mouth daily as needed.        . fish oil-omega-3 fatty acids 1000 MG capsule Take 1 g by mouth daily.        [name] levothyroxine (SYNTHROID, LEVOTHROID) 125 MCG tablet Take 125 mcg by mouth daily.        . polyethylene glycol (MIRALAX / GLYCOLAX) packet Take 17 g by mouth daily.        . Probiotic Product (PROBIOTIC FORMULA) CAPS Take 1 capsule by mouth daily.        . simvastatin (ZOCOR) 40 MG tablet Take 40 mg by mouth at bedtime.        . valsartan-hydrochlorothiazide (DIOVAN-HCT) 80-12.5 MG per tablet Take 1 tablet by mouth daily.        [name] glucose blood test strip 1 each by Other route as needed. Use as instructed         Allergies  Allergen Reactions  . Alprazolam     REACTION: eyes swell up  . Penicillins     REACTION: swells up all over body, pt states she almost died  . Sulfonamide Derivatives     REACTION: itching    Review of Systems  Constitutional:   No  weight loss, night sweats,  Fevers, chills, fatigue, or  lassitude.  HEENT:   No headaches,  Difficulty swallowing,  Tooth/dental problems, or  Sore throat,                No sneezing, itching, ear ache, nasal congestion, post nasal drip,   CV:  No chest pain,  Orthopnea, PND, swelling in lower extremities, anasarca, dizziness, palpitations, syncope.   GI  No heartburn, indigestion, abdominal pain, nausea, vomiting, diarrhea, change in bowel habits, loss of appetite, bloody stools.   Resp:  No coughing up of blood.  No chest wall deformity  Skin: no rash or lesions.  GU: no  dysuria, change in color of urine, no urgency or frequency.  No flank pain, no hematuria   MS:  No joint pain or swelling.  No decreased range of motion.  No back pain.  Psych:  No change in mood or affect. No depression or anxiety.  No memory loss.        Objective:   Physical Exam      WD, WN, 77 y/o WF in NAD;  VS- reviewed. GENERAL:  Alert &  oriented; pleasant & cooperative... HEENT:  [place]/AT, EOM-full, PERRLA, EACs-clear (she has bilat hearing aides), TMs-wnl, NOSE-clear, THROAT-clear & wnl. NECK:  Supple w/ fair ROM; no JVD; normal carotid impulses w/o bruits; no thyromegaly or nodules palpated; no lymphadenopathy. CHEST:  Clear to P & A; without wheezes/ rales/ or rhonchi heard; pacer in right upper shoulder area. HEART:  Regular gr 2/6 SEM S4 without rubs... ABDOMEN:  Soft & nontender; normal bowel sounds; no organomegaly or masses detected. EXT: without deformities, mild arthritic changes; +varicose veins/ +venous insuffic/ no edema. NEURO:  CN's intact; no focal neuro deficits  , MAEWx 4 , nml gait. nml grips bilaterally, neg pronator drift.  DERM:  No lesions noted; no rash etc...   Assessment & Plan:

## 2010-11-28 ENCOUNTER — Telehealth: Payer: Self-pay | Admitting: Pulmonary Disease

## 2010-11-28 NOTE — Telephone Encounter (Signed)
Please see cxr results

## 2010-11-28 NOTE — Telephone Encounter (Signed)
Pt called again in reference to previous message.Kayla Davis

## 2010-11-28 NOTE — Telephone Encounter (Signed)
Notes Recorded by Rubye Oaks, NP on 11/28/2010 at 9:17 AM CXR shows decreased size of PNA  Will need follow up cxr in 4 weeks to document clearance  follow up as planned   Spoke with Katheren Shams.  Regarding this -- pt will need OV in 4 wks with CXR at that time.   Called, spoke with pt.  I informed her cxr shows a decrease in the size of the pna per TP.  Informed TP recs she come back in 4 wks with a cxr to make sure this is still improving/clearing.  She verbalized understanding of these results and recs.  4 wk f/u with CXR scheduled for 12/25/10 at 2:30 with SN -- pt aware.

## 2010-11-28 NOTE — Telephone Encounter (Signed)
Error.Kayla Davis ° °

## 2010-11-29 LAB — URINE CULTURE

## 2010-12-03 NOTE — Assessment & Plan Note (Signed)
Hx of intermittent urinary symptoms Will recheck urine and cx today  Advised if not improving may need a trial of vesicare for overactive bladder.

## 2010-12-03 NOTE — Assessment & Plan Note (Addendum)
Clinically improving - xray lag time, infiltrate today is decreased in size   Will need follow up cxr for clearance   Plan  Cont on current regimen Advance act as tolerated.   Follow up Dr. Kriste Basque  With cxr in 4 weeks    Check on flu shot after Labor Day.

## 2010-12-12 ENCOUNTER — Telehealth: Payer: Self-pay | Admitting: Adult Health

## 2010-12-12 NOTE — Telephone Encounter (Signed)
That should be fine  Call if needed

## 2010-12-12 NOTE — Telephone Encounter (Signed)
Pt says she is due for a spinal injection with Dr. Ethelene Hal but Dr. Ethelene Hal wanted her to check with TP or SN to make sure this was okay. She is sch to see SN w/ CXR on 9/12 to make sure PNA is clearing. Will forward to TP for recs regarding this. Pls advise.

## 2010-12-12 NOTE — Telephone Encounter (Signed)
Spoke with pt and is aware of tp stated that it should be fine for her to get the injection

## 2010-12-25 ENCOUNTER — Ambulatory Visit (INDEPENDENT_AMBULATORY_CARE_PROVIDER_SITE_OTHER)
Admission: RE | Admit: 2010-12-25 | Discharge: 2010-12-25 | Disposition: A | Discharge status: Home / Self Care | Payer: MEDICARE | Source: Ambulatory Visit | Attending: Pulmonary Disease | Admitting: Pulmonary Disease

## 2010-12-25 ENCOUNTER — Ambulatory Visit (INDEPENDENT_AMBULATORY_CARE_PROVIDER_SITE_OTHER): Payer: MEDICARE | Admitting: Pulmonary Disease

## 2010-12-25 ENCOUNTER — Other Ambulatory Visit: Payer: Self-pay | Admitting: Pulmonary Disease

## 2010-12-25 DIAGNOSIS — M549 Dorsalgia, unspecified: Secondary | ICD-10-CM

## 2010-12-25 DIAGNOSIS — E039 Hypothyroidism, unspecified: Secondary | ICD-10-CM

## 2010-12-25 DIAGNOSIS — M810 Age-related osteoporosis without current pathological fracture: Secondary | ICD-10-CM

## 2010-12-25 DIAGNOSIS — D649 Anemia, unspecified: Secondary | ICD-10-CM

## 2010-12-25 DIAGNOSIS — R7309 Other abnormal glucose: Secondary | ICD-10-CM

## 2010-12-25 DIAGNOSIS — E785 Hyperlipidemia, unspecified: Secondary | ICD-10-CM

## 2010-12-25 DIAGNOSIS — I872 Venous insufficiency (chronic) (peripheral): Secondary | ICD-10-CM

## 2010-12-25 DIAGNOSIS — I10 Essential (primary) hypertension: Secondary | ICD-10-CM

## 2010-12-25 DIAGNOSIS — Z950 Presence of cardiac pacemaker: Secondary | ICD-10-CM

## 2010-12-25 DIAGNOSIS — M1990 Unspecified osteoarthritis, unspecified site: Secondary | ICD-10-CM

## 2010-12-25 DIAGNOSIS — J189 Pneumonia, unspecified organism: Secondary | ICD-10-CM

## 2010-12-25 DIAGNOSIS — I359 Nonrheumatic aortic valve disorder, unspecified: Secondary | ICD-10-CM

## 2010-12-25 DIAGNOSIS — F411 Generalized anxiety disorder: Secondary | ICD-10-CM

## 2010-12-25 MED ORDER — SIMVASTATIN 40 MG PO TABS: 40.0000 mg | ORAL_TABLET | Freq: Every day | ORAL | Status: DC

## 2010-12-25 MED ORDER — VALSARTAN-HYDROCHLOROTHIAZIDE 80-12.5 MG PO TABS: 1.0000 | ORAL_TABLET | Freq: Every day | ORAL | Status: DC

## 2010-12-25 NOTE — Patient Instructions (Signed)
Today we updated your med list in EPIC...    We refilled your DiovanHCT & Simvastatin...  Your CXR today shows that the previous right lung pneumonia is gone...  Call for any problems...  Let's plan a recheck in 4 months or so w/ FASTING blood work at that time.Marland KitchenMarland Kitchen

## 2010-12-25 NOTE — Progress Notes (Signed)
Subjective:    Patient ID: Kayla Davis, female    DOB: 01-Jun-1922, 77 y.o.   MRN: [id number]  HPI 77 y/o WF here for a follow up visit... she has mult med problems including:  Recurrent bronchitic infections;  HBP;  Aortic valve dis;  Cardiac pacemaker;  Ven insuffic;  Hyperlipidemia;  DM;  Hypothyroid;  Divertics/ constip;  DJD/ LBP;  Osteopenia;  Hx TIA;  Periph neuropathy;  Anxiety;  Anemia; and hx Shingles...  ~  July 31, 2009:  she states that she feels OK overall, energy good (so she quit her iron Rx), but complains "my toes are stiff & red at night, not limber like my fingers"... she's had 2 shots in back from DrRamos w/ temp relief... she has mild AoV dis w/ thickened valve ?mild AS- needs f/u 2DEcho... BP controlled on DiovanHCT;  remains on Simva40+FishOil & due for FLP;  BS controlled on diet alone;  other problems as noted below...  ~  July 30, 2010:  [name] & she has had an eventful yr w/ [facility] 10/11 for presyncope w/ LBBB & pauses ==> pacer placed & she is now followed in the device clinic & saw DrAllred 2/12 doing satis;  She continues to complain about leg cramps- now mostly at night & we discussed Tonic water, Mustard, etc;  Her son brought her some "quinine pills" from ?[employer] (homeopathic under the tongue tab) & she will try this if she awakes w/ the discomfort;  She has f/u w/ DrRamos for epid shots (2/3 have helped)...  Otherw stable> BP controlled, no recent CP, due for f/u FLP & DM labs, etc (see below)>  ~  December 25, 2010:  73mo ROV & recheck from recent eval by TP> seen 8/12 w/ RLL pneumonia on CXR, treated w/ Avelox & improved, some XRay lag time but CXR today back to baseline...    Hx HBP controlled on DiovanHCT w/ BP= 146/70 today & she denies HA, visual symptoms, CP, palpit, syncope, ch in dyspnea, edema;  she has pacer, LBBB, AoV dis- followed by DrAllred for cards...    Hx Hyperlipid on Simva40            Problem List:  HEARING LOSS (ICD-389.9) - she  has bilat hearing aides.  BRONCHITIS, RECURRENT (ICD-491.9) - no recent resp tract infections...  HYPERTENSION (ICD-401.9) - controlled on DIOVAN/Hct 80-12.5 daily...  ~  4/12:  BP=120/58 and tol med well... she denies HA, fatigue, visual changes, CP, palipit, ch in dyspnea, edema, etc...  CHEST WALL PAIN, HX OF (ICD-V15.89)  AORTIC VALVE DISORDERS (ICD-424.1)- s/p cardiac work up in 2001... currently denies CP etc... ~  NuclearStressTest 3/01 showed impaired exerc capacity & ST depression but no ischemia or infarction on images and EF=72%... ~  2DEcho 10/06 showed no regional wall motion abn, EF= 70%, mild incr AV thickness w/ reduced excursion, mild AI, sl decr RV function. ~  2DEcho 5/11 showed norm LV size & function, EF=55-60%, mild AI w/ sl thickening of valve, mild MR, mod TR, grI DD...  PACER for Pre-syncope>  Hosp 10/11 for presyncope w/ LBBB & pauses ==> pacer placed & she is now stable, asymptomatic, & followed in the device clinic by DrAllred...  VENOUS INSUFFICIENCY (ICD-459.81) - she has mod VV, VI, & chr leg symptoms... good arterial pulses distally, no ischemic symptoms.  HYPERLIPIDEMIA (ICD-272.4) - on SIMVASTATIN 40mg /d & she has stopped it on several occas due to leg discomfort to see if this would help- it  has never lead to improved leg symptoms!... also takes FISH OIL daily. ~  FLP 4/08 on Simvastatin 40mg /d showed TChol 171, TG 88, HDL 58, LDL 96... ~  FLP 6/09 showed TChol 220, TG 98, HDL 58, LDL 143... rec to restart her Simvast40. ~  10/10: not fasting at present & we discussed f/u FLP on ret in 2011 ~  FLP 4/11 showed TChol 176, TG 81, HDL 65, LDL 95 ~  FLP 4/12 showed TChol 189, TG 103, HDL 70, LDL 99  DIABETES MELLITUS, BORDERLINE (ICD-790.29) - on diet alone...  ~  labs 4/08 w/ BS= 106... ~  labs 2/09 w/ BS= 170, HgA1c= 6.7.Marland KitchenMarland Kitchen rec to start Metformin 500mg /d but she declined, prefer diet... ~  labs 6/09 showed BS= 97, HgA1c= 6.6.Marland KitchenMarland Kitchen rec- continue diet Rx. ~   labs 3/10 showed BS= 129, A1c= 6.6 ~  labs 9/10 showed BS= 108 ~  labs 4/11 showed BS= 96 ~  Labs 4/12 showed BS= 89  HYPOTHYROIDISM (ICD-244.9) - on SYNTHROID 152mcg/d... ~  TSH 4/08 = 1.45... keep same dose. ~  TSH 2/09 = 0.17 on 158mcg/d dose... rec to decr to 156mcg/d... ~  TSH 6/09 = 6.20 on 138mcg/d dose... she wants ret to dose. ~  TSH 3/10 = 0.41 on 125mcg/d dose. ~  TSH 4/11 = 0.24 on 120mcg/d dose... She doesn't want to decr the dose. ~  TSH 4/12 = 0.83 on 171mcg/d dose  DIVERTICULOSIS OF COLON (ICD-562.10) & CONSTIPATION (ICD-564.00) - we discussed a regimen to prevent constipation & she uses MIRALAX Prn... ~  Flex Sig 6/96 by DrSam showed divertics, otherw normal left colon...  DEGENERATIVE JOINT DISEASE (ICD-715.90) - she takes Glucosamine/ Chondroitin... LOW BACK PAIN & ?Neurogenic claudication in legs> ~  she persisits w/ mult somatic complaints--- she states she has tried off Simvastatin, Lyrica, Fosamax & others but nothing helps and her symptoms persist... XRays showed mild DJD both hips & lower spine; & BMD 5/09 showed min osteopenia w/ TScores -0.0 in Spine & -1.4 in right FemNeck... she has tried Rx w/ Tramadol, Advil, Osteobiflex, heat, etc...  ~  eval by Ortho- DrRamos w/ LBP, multilevel DDD, spondylolisthesis L5-S1 & bilat leg pain> she's tried phys therapy, water exercises, meds (no benefit); epid steroid shots have helped some...  OSTEOPOROSIS (ICD-733.00) - on ALENDRONATE 70mg /wk, calcium, vitamins... ~  BMD here 5/09 showed TScores of 0.00 in Spine, and -1.4 in the right Fem Neck ~  BMD here 9/11 showed TScores of +0.8 in Spine, and -1.7 in the right [facility]... rec to continue same med.  Hx of TRANSIENT ISCHEMIC ATTACK (ICD-435.9) - she remains on ASA and has not had any new cerebral ischemic symptoms...  PERIPHERAL NEUROPATHY (ICD-356.9) - c/o chronic leg discomfort as noted... extensive work up in the past- DrStuckey 2001 w/ normal art dopplers; DrLove  in 2001 w/ a neg NCV study;  DrAplington w/ meds and TENS trial (per her request- it helped her brother)... now DrRamos w/ PT, water exercises, and epid shots... tried Lyrica w/o benefit...  ANXIETY (ICD-300.00) - she declines anxiolytic Rx.  SHINGLES (ICD-053.9) - developed shingles Mar09 involving right sacral dermatomes w/ rash and pain in the perineal and buttock areas... treated via ER & several visits w/ NP w/ Valtrex, Pred, Lyrica, Vicodin... lesions resolved, some persist post-herpetic neuralgia...  ANEMIA, MILD (ICD-285.9) ~  labs 9/09 showed Hg= 11.8.[name]. ~  labs 3/10 showed Hg= 11.6, Fe= 64 (sat=20%) ~  labs 4/11 showed = Hg= 11.4 ~  Labs 4/12 showed Hg= 11.9  HEALTH MAINTENANCE:  she had the 2010 Flu shot 9/10... PNEUMOVAX in 2006 age age 23...   Past Surgical History  Procedure Date  . Pacemaker insertion   . Appendectomy   . Cholecystectomy   . Tonsillectomy   . Vaginal hysterectomy     Outpatient Encounter Prescriptions as of 12/25/2010  Medication Sig Dispense Refill  . alendronate (FOSAMAX) 70 MG tablet Take 70 mg by mouth every 7 (seven) days. Take with a full glass of water on an empty stomach.       [name] aspirin 325 MG tablet Take 325 mg by mouth daily.        . Calcium Carbonate-Vitamin D (CALTRATE 600+D) 600-400 MG-UNIT per tablet Take 1 tablet by mouth daily.        . carisoprodol (SOMA) 250 MG tablet Take 1 tablet (250 mg total) by mouth at bedtime.  30 tablet  1  . cholecalciferol (VITAMIN D) 1000 UNITS tablet Take 1,000 Units by mouth daily.        [name] co-enzyme Q-10 30 MG capsule Take 30 mg by mouth daily.        . Cyanocobalamin (B-12) 2500 MCG TABS Take 1 tablet by mouth every other day.       . docusate sodium (COLACE) 100 MG capsule Take 100 mg by mouth daily as needed.        . fish oil-omega-3 fatty acids 1000 MG capsule Take 1 g by mouth daily.        [name] levothyroxine (SYNTHROID, LEVOTHROID) 125 MCG tablet Take 125 mcg by mouth daily.        . polyethylene  glycol (MIRALAX / GLYCOLAX) packet Take 17 g by mouth daily as needed.       . pregabalin (LYRICA) 75 MG capsule Take 75 mg by mouth daily.        . Probiotic Product (PROBIOTIC FORMULA) CAPS Take 1 capsule by mouth daily.        . simvastatin (ZOCOR) 40 MG tablet Take 40 mg by mouth at bedtime.        . valsartan-hydrochlorothiazide (DIOVAN-HCT) 80-12.5 MG per tablet Take 1 tablet by mouth daily.        . Alpha-Lipoic Acid 200 MG CAPS Take 1 capsule by mouth daily.        [name] glucose blood test strip 1 each by Other route as needed. Use as instructed         Allergies  Allergen Reactions  . Alprazolam     REACTION: eyes swell up  . Penicillins     REACTION: swells up all over body, pt states she almost died  . Sulfonamide Derivatives     REACTION: itching    Review of Systems         See HPI - all other systems neg except as noted... The patient complains of decreased hearing, dyspnea on exertion, and difficulty walking.  The patient denies anorexia, fever, weight loss, weight gain, vision loss, hoarseness, chest pain, syncope, peripheral edema, prolonged cough, headaches, hemoptysis, abdominal pain, melena, hematochezia, severe indigestion/heartburn, hematuria, incontinence, muscle weakness, suspicious skin lesions, transient blindness, depression, unusual weight change, abnormal bleeding, enlarged lymph nodes, and angioedema.    Objective:   Physical Exam      WD, WN, 77 y/o WF in NAD;  VS- reviewed. GENERAL:  Alert & oriented; pleasant & cooperative... HEENT:  [place]/AT, EOM-full, PERRLA, EACs-clear (she has bilat hearing aides), TMs-wnl, NOSE-clear, THROAT-clear & wnl.  NECK:  Supple w/ fair ROM; no JVD; normal carotid impulses w/o bruits; no thyromegaly or nodules palpated; no lymphadenopathy. CHEST:  Clear to P & A; without wheezes/ rales/ or rhonchi heard; pacer in right upper shoulder area. HEART:  Regular gr 2/6 SEM S4 without rubs... ABDOMEN:  Soft & nontender; normal bowel  sounds; no organomegaly or masses detected. EXT: without deformities, mild arthritic changes; +varicose veins/ +venous insuffic/ no edema. NEURO:  CN's intact; no focal neuro deficits x decr sensation in LE's c/w periph neuropathy.  DERM:  No lesions noted; no rash etc...   Assessment & Plan:   PNEUMONIA>  RLL pneumonia resolved now, underlying COPD, hx recurrent bronchitis...   HBP>  Controlled on Doivan/HCT;  Continue same...  CARDIAC>  Hx CWP, AoV dis, Pacer;  Stable now & doing well w/o symptoms;  She will continue f/u in Pacer clinic...  LIPIDS>  On Simva40 + CoQ10;  She will ret for f/u FLP & we will notify her of the results...  DM>  On diet alone;  Labs have beed good & she will ret for fasting blood work later this week...  HYPOTHYROID>  Stable on the Synth125 but TSH was sl over suppressed last yr;  We will recheck again & make recommendations when the labs return...  ORTHO>  She has DJD, LBP, leg pain> she continues f/u by DrRamos for shots prn, doesn't want pain meds, we reviewed her predicament...  Noct leg pain>  This seems like an additional prob & rec to try tonic water, mustard, & the homeopathic quinine prep that her son found for her to put under her tongue...  Other medical problems as noted.Marland KitchenMarland Kitchen

## 2010-12-28 ENCOUNTER — Encounter: Payer: Self-pay | Admitting: Pulmonary Disease

## 2010-12-28 ENCOUNTER — Other Ambulatory Visit: Payer: Self-pay | Admitting: Pulmonary Disease

## 2010-12-30 ENCOUNTER — Telehealth: Payer: Self-pay | Admitting: Pulmonary Disease

## 2010-12-30 MED ORDER — LEVOTHYROXINE SODIUM 125 MCG PO TABS: 125.0000 ug | ORAL_TABLET | Freq: Every day | ORAL | Status: DC

## 2010-12-30 NOTE — Telephone Encounter (Signed)
Spoke with pt and notified that rx was sent to pharm. Nothing further needed per pt.

## 2010-12-30 NOTE — Telephone Encounter (Signed)
Rx refill sent to pharm per pt request. ATC and inform her, line busy x 3, WCB.

## 2011-02-04 ENCOUNTER — Ambulatory Visit: Payer: Medicare Other | Admitting: Pulmonary Disease

## 2011-02-12 ENCOUNTER — Encounter: Payer: Self-pay | Admitting: Internal Medicine

## 2011-02-12 ENCOUNTER — Ambulatory Visit (INDEPENDENT_AMBULATORY_CARE_PROVIDER_SITE_OTHER): Payer: MEDICARE | Admitting: Internal Medicine

## 2011-02-12 DIAGNOSIS — I10 Essential (primary) hypertension: Secondary | ICD-10-CM

## 2011-02-12 DIAGNOSIS — E785 Hyperlipidemia, unspecified: Secondary | ICD-10-CM

## 2011-02-12 DIAGNOSIS — I495 Sick sinus syndrome: Secondary | ICD-10-CM

## 2011-02-12 LAB — PACEMAKER DEVICE OBSERVATION
AL AMPLITUDE: 4 mv
AL IMPEDENCE PM: 508 Ohm
BAMS-0001: 175 {beats}/min
BATTERY VOLTAGE: 2.79 V
RV LEAD IMPEDENCE PM: 558 Ohm
VENTRICULAR PACING PM: 19

## 2011-02-12 NOTE — Progress Notes (Signed)
The patient presents today for routine electrophysiology followup.  Since last being seen in our clinic, the patient reports doing very well.  Her primary concern is with bilateral leg weakness.  She thinks that this may be due to simvastatin.  Today, she denies symptoms of palpitations, chest pain, shortness of breath, orthopnea, PND, lower extremity edema, dizziness, presyncope, syncope, or neurologic sequela.  The patient is otherwise without complaint today.   Past Medical History  Diagnosis Date  . Hypertension   . Hyperlipidemia   . Aortic valve disorders   . Unspecified hereditary and idiopathic peripheral neuropathy   . Other abnormal glucose   . Unspecified hearing loss   . Unspecified chronic bronchitis   . Unspecified hypothyroidism   . Unspecified gastritis and gastroduodenitis without mention of hemorrhage   . Diverticulosis of colon (without mention of hemorrhage)   . Irritable bowel syndrome   . Osteoarthrosis, unspecified whether generalized or localized, unspecified site   . Backache, unspecified   . Pain in limb   . Osteoporosis, unspecified   . Herpes zoster with other nervous system complications   . Anxiety state, unspecified   . Anemia, unspecified   . Bradycardia     s/p PPM   Past Surgical History  Procedure Date  . Pacemaker insertion     MDT  . Appendectomy   . Cholecystectomy   . Tonsillectomy   . Vaginal hysterectomy     Current Outpatient Prescriptions  Medication Sig Dispense Refill  . alendronate (FOSAMAX) 70 MG tablet Take 70 mg by mouth every 7 (seven) days. Take with a full glass of water on an empty stomach.       . Alpha-Lipoic Acid 200 MG CAPS Take 1 capsule by mouth daily.        [name] aspirin 325 MG tablet Take 325 mg by mouth daily.        . Calcium Carbonate-Vitamin D (CALTRATE 600+D) 600-400 MG-UNIT per tablet Take 1 tablet by mouth daily.        . carisoprodol (SOMA) 250 MG tablet Take 250 mg by mouth at bedtime. prn       .  cholecalciferol (VITAMIN D) 1000 UNITS tablet Take 1,000 Units by mouth daily.        [name] co-enzyme Q-10 30 MG capsule Take 30 mg by mouth daily.        . Cyanocobalamin (B-12) 2500 MCG TABS Take 1 tablet by mouth every other day.       . docusate sodium (COLACE) 100 MG capsule Take 100 mg by mouth daily as needed.        . fish oil-omega-3 fatty acids 1000 MG capsule Take 1 g by mouth daily.        [name] glucose blood test strip 1 each by Other route as needed. Use as instructed       . levothyroxine (SYNTHROID, LEVOTHROID) 125 MCG tablet Take 1 tablet (125 mcg total) by mouth daily.  90 tablet  3  . polyethylene glycol (MIRALAX / GLYCOLAX) packet Take 17 g by mouth daily as needed.       . Probiotic Product (PROBIOTIC FORMULA) CAPS Take 1 capsule by mouth daily.        . simvastatin (ZOCOR) 40 MG tablet Take 1 tablet (40 mg total) by mouth at bedtime.  90 tablet  3  . valsartan-hydrochlorothiazide (DIOVAN-HCT) 80-12.5 MG per tablet Take 1 tablet by mouth daily.  90 tablet  3    Allergies  Allergen  Reactions  . Alprazolam     REACTION: eyes swell up  . Penicillins     REACTION: swells up all over body, pt states she almost died  . Sulfonamide Derivatives     REACTION: itching    History   Social History  . Marital Status: Married    Spouse Name: Kyung Rudd.  (deceased)    Number of Children: 4  . Years of Education: N/A   Occupational History  . Retired   .     Social History Main Topics  . Smoking status: Never Smoker   . Smokeless tobacco: Never Used  . Alcohol Use: No  . Drug Use: No  . Sexually Active: Not on file   Other Topics Concern  . Not on file   Social History Narrative  . No narrative on file    Family History  Problem Relation Age of Onset  . Heart disease Father   . Cancer Paternal Grandmother   . Diabetes Father   . Heart disease Mother     ROS-  All systems are reviewed and are negative except as outlined in the HPI above   Physical Exam: Filed  Vitals:   02/12/11 1403  BP: 143/71  Pulse: 69  Height: 5\' 5"  (1.651 m)  Weight: 152 lb 1.9 oz (69.001 kg)    GEN- The patient is well appearing, alert and oriented x 3 today.   Head- normocephalic, atraumatic Eyes-  Sclera clear, conjunctiva pink Ears- hearing intact Oropharynx- clear Neck- supple, no JVP Lymph- no cervical lymphadenopathy Lungs- Clear to ausculation bilaterally, normal work of breathing Chest- pacemaker pocket is well healed Heart- Regular rate and rhythm, no murmurs, rubs or gallops, PMI not laterally displaced GI- soft, NT, ND, + BS Extremities- no clubbing, cyanosis, or edema MS- no significant deformity or atrophy Skin- no rash or lesion Psych- euthymic mood, full affect Neuro- strength and sensation are intact  Pacemaker interrogation- reviewed in detail today,  See PACEART report  Assessment and Plan:

## 2011-02-12 NOTE — Patient Instructions (Addendum)
Your physician wants you to follow-up in: 12 months with Dr Jacquiline Doe will receive a reminder letter in the mail two months in advance. If you don't receive a letter, please call our office to schedule the follow-up appointment.  Remote monitoring is used to monitor your Pacemaker of ICD from home. This monitoring reduces the number of office visits required to check your device to one time per year. It allows [place] to keep an eye on the functioning of your device to ensure it is working properly. You are scheduled for a device check from home on 05/15/2011. You may send your transmission at any time that day. If you have a wireless device, the transmission will be sent automatically. After your physician reviews your transmission, you will receive a postcard with your next transmission date.  Your physician has recommended you make the following change in your medication:  1)Stop Simvastatin Call Tresa Endo in 3 weeks [phone] to let me know if any better off medication

## 2011-02-12 NOTE — Assessment & Plan Note (Signed)
Normal pacemaker function See Pace Art report No changes today  

## 2011-02-12 NOTE — Assessment & Plan Note (Signed)
Stop simvastatin today If myalgias improve, we would either keep her off of statins long term or consider trying crestor If her leg pain does not improve in 3-4 weeks, we will restart simvastatin

## 2011-02-12 NOTE — Assessment & Plan Note (Signed)
Stable No change required today  

## 2011-02-12 NOTE — Progress Notes (Signed)
Addended by: Dennis Bast F on: 02/12/2011 02:45 PM   Modules accepted: Orders

## 2011-02-13 ENCOUNTER — Encounter: Payer: Self-pay | Admitting: Adult Health

## 2011-02-13 ENCOUNTER — Ambulatory Visit (INDEPENDENT_AMBULATORY_CARE_PROVIDER_SITE_OTHER): Payer: MEDICARE | Admitting: Adult Health

## 2011-02-13 VITALS — BP 122/64 | HR 73 | Temp 98.3°F | Ht 65.5 in | Wt 153.6 lb

## 2011-02-13 DIAGNOSIS — M79605 Pain in left leg: Secondary | ICD-10-CM

## 2011-02-13 DIAGNOSIS — M1990 Unspecified osteoarthritis, unspecified site: Secondary | ICD-10-CM

## 2011-02-13 DIAGNOSIS — M79609 Pain in unspecified limb: Secondary | ICD-10-CM

## 2011-02-13 MED ORDER — TRAMADOL HCL 50 MG PO TABS: 50.0000 mg | ORAL_TABLET | Freq: Four times a day (QID) | ORAL | Status: DC | PRN

## 2011-02-13 NOTE — Patient Instructions (Signed)
We are setting you up for a venous doppler of your leg  Aleve Twice daily  For 5 days  Tramadol 50mg  Twice daily  As needed  Pain, may make you sleepy.  Warm compresses to ankle  Please contact office for sooner follow up if symptoms do not improve or worsen or seek emergency care

## 2011-02-13 NOTE — Assessment & Plan Note (Addendum)
Left lower leg/ankle pain suspect is Musculoskeletal in nature however does have some asymmetrical left sided swelling with acute pain  Set up for STAT venous doppler to r/o DVT  Will treat for DJD flare w/ NSAIDS and  Pain meds  Plan:  We are setting you up for a venous doppler of your leg  Aleve Twice daily  For 5 days  Tramadol 50mg  Twice daily  As needed  Pain, may make you sleepy.  Warm compresses to ankle  Please contact office for sooner follow up if symptoms do not improve or worsen or seek emergency care

## 2011-02-13 NOTE — Progress Notes (Addendum)
Subjective:    Patient ID: Kayla Davis, female    DOB: 08-05-1922, 77 y.o.   MRN: [id number]  HPI 77 y/o WF here  she has mult med problems including:  Recurrent bronchitic infections;  HBP;  Aortic valve dis;  Cardiac pacemaker;  Ven insuffic;  Hyperlipidemia;  DM;  Hypothyroid;  Divertics/ constip;  DJD/ LBP;  Osteopenia;  Hx TIA;  Periph neuropathy;  Anxiety;  Anemia; and hx Shingles...  ~  July 31, 2009:  she states that she feels OK overall, energy good (so she quit her iron Rx), but complains "my toes are stiff & red at night, not limber like my fingers"... she's had 2 shots in back from DrRamos w/ temp relief... she has mild AoV dis w/ thickened valve ?mild AS- needs f/u 2DEcho... BP controlled on DiovanHCT;  remains on Simva40+FishOil & due for FLP;  BS controlled on diet alone;  other problems as noted below...  ~  July 30, 2010:  [name] & she has had an eventful yr w/ [facility] 10/11 for presyncope w/ LBBB & pauses ==> pacer placed & she is now followed in the device clinic & saw DrAllred 2/12 doing satis;  She continues to complain about leg cramps- now mostly at night & we discussed Tonic water, Mustard, etc;  Her son brought her some "quinine pills" from ?[employer] (homeopathic under the tongue tab) & she will try this if she awakes w/ the discomfort;  She has f/u w/ DrRamos for epid shots (2/3 have helped)...  Otherw stable> BP controlled, no recent CP, due for f/u FLP & DM labs, etc (see below)>  ~  December 25, 2010:  62mo ROV & recheck from recent eval by TP> seen 8/12 w/ RLL pneumonia on CXR, treated w/ Avelox & improved, some XRay lag time but CXR today back to baseline...    Hx HBP controlled on DiovanHCT w/ BP= 146/70 today & she denies HA, visual symptoms, CP, palpit, syncope, ch in dyspnea, edema;  she has pacer, LBBB, AoV dis- followed by DrAllred for cards...    Hx Hyperlipid on Simva40     02/13/2011 Acute OV  Pt complains of left ankle and leg pain- 7/10 pain scale. Pt  states the pain started when she went to bed last night . Pt states she did not hit it on anything. No known hx of injury. SHe is convinced this is a blood clot in her leg. Says she has sharp pain along the lower part of her leg and ankle. No redness or fever.        Problem List:  HEARING LOSS (ICD-389.9) - she has bilat hearing aides.  BRONCHITIS, RECURRENT (ICD-491.9) - no recent resp tract infections...  HYPERTENSION (ICD-401.9) - controlled on DIOVAN/Hct 80-12.5 daily...  ~  4/12:  BP=120/58 and tol med well... she denies HA, fatigue, visual changes, CP, palipit, ch in dyspnea, edema, etc...  CHEST WALL PAIN, HX OF (ICD-V15.89)  AORTIC VALVE DISORDERS (ICD-424.1)- s/p cardiac work up in 2001... currently denies CP etc... ~  NuclearStressTest 3/01 showed impaired exerc capacity & ST depression but no ischemia or infarction on images and EF=72%... ~  2DEcho 10/06 showed no regional wall motion abn, EF= 70%, mild incr AV thickness w/ reduced excursion, mild AI, sl decr RV function. ~  2DEcho 5/11 showed norm LV size & function, EF=55-60%, mild AI w/ sl thickening of valve, mild MR, mod TR, grI DD...  PACER for Pre-syncope>  Hosp 10/11 for presyncope w/  LBBB & pauses ==> pacer placed & she is now stable, asymptomatic, & followed in the device clinic by DrAllred...  VENOUS INSUFFICIENCY (ICD-459.81) - she has mod VV, VI, & chr leg symptoms... good arterial pulses distally, no ischemic symptoms.  HYPERLIPIDEMIA (ICD-272.4) - on SIMVASTATIN 40mg /d & she has stopped it on several occas due to leg discomfort to see if this would help- it has never lead to improved leg symptoms!... also takes FISH OIL daily. ~  FLP 4/08 on Simvastatin 40mg /d showed TChol 171, TG 88, HDL 58, LDL 96... ~  FLP 6/09 showed TChol 220, TG 98, HDL 58, LDL 143... rec to restart her Simvast40. ~  10/10: not fasting at present & we discussed f/u FLP on ret in 2011 ~  FLP 4/11 showed TChol 176, TG 81, HDL 65, LDL 95 ~  FLP  4/12 showed TChol 189, TG 103, HDL 70, LDL 99  DIABETES MELLITUS, BORDERLINE (ICD-790.29) - on diet alone...  ~  labs 4/08 w/ BS= 106... ~  labs 2/09 w/ BS= 170, HgA1c= 6.7.Marland KitchenMarland Kitchen rec to start Metformin 500mg /d but she declined, prefer diet... ~  labs 6/09 showed BS= 97, HgA1c= 6.6.Marland KitchenMarland Kitchen rec- continue diet Rx. ~  labs 3/10 showed BS= 129, A1c= 6.6 ~  labs 9/10 showed BS= 108 ~  labs 4/11 showed BS= 96 ~  Labs 4/12 showed BS= 89  HYPOTHYROIDISM (ICD-244.9) - on SYNTHROID 163mcg/d... ~  TSH 4/08 = 1.45... keep same dose. ~  TSH 2/09 = 0.17 on 143mcg/d dose... rec to decr to 16mcg/d... ~  TSH 6/09 = 6.20 on 132mcg/d dose... she wants ret to dose. ~  TSH 3/10 = 0.41 on 118mcg/d dose. ~  TSH 4/11 = 0.24 on 110mcg/d dose... She doesn't want to decr the dose. ~  TSH 4/12 = 0.83 on 179mcg/d dose  DIVERTICULOSIS OF COLON (ICD-562.10) & CONSTIPATION (ICD-564.00) - we discussed a regimen to prevent constipation & she uses MIRALAX Prn... ~  Flex Sig 6/96 by DrSam showed divertics, otherw normal left colon...  DEGENERATIVE JOINT DISEASE (ICD-715.90) - she takes Glucosamine/ Chondroitin... LOW BACK PAIN & ?Neurogenic claudication in legs> ~  she persisits w/ mult somatic complaints--- she states she has tried off Simvastatin, Lyrica, Fosamax & others but nothing helps and her symptoms persist... XRays showed mild DJD both hips & lower spine; & BMD 5/09 showed min osteopenia w/ TScores -0.0 in Spine & -1.4 in right FemNeck... she has tried Rx w/ Tramadol, Advil, Osteobiflex, heat, etc...  ~  eval by Ortho- DrRamos w/ LBP, multilevel DDD, spondylolisthesis L5-S1 & bilat leg pain> she's tried phys therapy, water exercises, meds (no benefit); epid steroid shots have helped some...  OSTEOPOROSIS (ICD-733.00) - on ALENDRONATE 70mg /wk, calcium, vitamins... ~  BMD here 5/09 showed TScores of 0.00 in Spine, and -1.4 in the right Fem Neck ~  BMD here 9/11 showed TScores of +0.8 in Spine, and -1.7 in the right  [facility]... rec to continue same med.  Hx of TRANSIENT ISCHEMIC ATTACK (ICD-435.9) - she remains on ASA and has not had any new cerebral ischemic symptoms...  PERIPHERAL NEUROPATHY (ICD-356.9) - c/o chronic leg discomfort as noted... extensive work up in the past- DrStuckey 2001 w/ normal art dopplers; DrLove in 2001 w/ a neg NCV study;  DrAplington w/ meds and TENS trial (per her request- it helped her brother)... now DrRamos w/ PT, water exercises, and epid shots... tried Lyrica w/o benefit...  ANXIETY (ICD-300.00) - she declines anxiolytic Rx.  SHINGLES (ICD-053.9) -  developed shingles Mar09 involving right sacral dermatomes w/ rash and pain in the perineal and buttock areas... treated via ER & several visits w/ NP w/ Valtrex, Pred, Lyrica, Vicodin... lesions resolved, some persist post-herpetic neuralgia...  ANEMIA, MILD (ICD-285.9) ~  labs 9/09 showed Hg= 11.8.[name]. ~  labs 3/10 showed Hg= 11.6, Fe= 64 (sat=20%) ~  labs 4/11 showed = Hg= 11.4 ~  Labs 4/12 showed Hg= 11.9  HEALTH MAINTENANCE:  she had the 2010 Flu shot 9/10... PNEUMOVAX in 2006 age age 10...   Past Surgical History  Procedure Date  . Pacemaker insertion     MDT  . Appendectomy   . Cholecystectomy   . Tonsillectomy   . Vaginal hysterectomy     Outpatient Encounter Prescriptions as of 02/13/2011  Medication Sig Dispense Refill  . alendronate (FOSAMAX) 70 MG tablet Take 70 mg by mouth every 7 (seven) days. Take with a full glass of water on an empty stomach.       . Alpha-Lipoic Acid 200 MG CAPS Take 1 capsule by mouth daily.        [name] aspirin 325 MG tablet Take 325 mg by mouth daily.        . Calcium Carbonate-Vitamin D (CALTRATE 600+D) 600-400 MG-UNIT per tablet Take 1 tablet by mouth daily.        . cholecalciferol (VITAMIN D) 1000 UNITS tablet Take 1,000 Units by mouth daily.        [name] co-enzyme Q-10 30 MG capsule Take 30 mg by mouth daily.        . Cyanocobalamin (B-12) 2500 MCG TABS Take 1 tablet by mouth every  other day.       . docusate sodium (COLACE) 100 MG capsule Take 100 mg by mouth daily as needed.        . fish oil-omega-3 fatty acids 1000 MG capsule Take 1 g by mouth daily.        [name] glucose blood test strip 1 each by Other route as needed. Use as instructed       . Homeopathic Products ([facility] LEG CRAMPS PAIN RELIEF PO) Take by mouth. 3 tablets a day       . levothyroxine (SYNTHROID, LEVOTHROID) 125 MCG tablet Take 1 tablet (125 mcg total) by mouth daily.  90 tablet  3  . polyethylene glycol (MIRALAX / GLYCOLAX) packet Take 17 g by mouth daily as needed.       . Probiotic Product (PROBIOTIC FORMULA) CAPS Take 1 capsule by mouth daily.        . simvastatin (ZOCOR) 40 MG tablet Take 40 mg by mouth daily.        . valsartan-hydrochlorothiazide (DIOVAN-HCT) 80-12.5 MG per tablet Take 1 tablet by mouth daily.  90 tablet  3  . carisoprodol (SOMA) 250 MG tablet Take 250 mg by mouth at bedtime. prn         Allergies  Allergen Reactions  . Alprazolam     REACTION: eyes swell up  . Penicillins     REACTION: swells up all over body, pt states she almost died  . Sulfonamide Derivatives     REACTION: itching    Review of Systems          Constitutional:   No  weight loss, night sweats,  Fevers, chills, fatigue, or  lassitude.  HEENT:   No headaches,  Difficulty swallowing,  Tooth/dental problems, or  Sore throat,  No sneezing, itching, ear ache, nasal congestion, post nasal drip,   CV:  No chest pain,  Orthopnea, PND, swelling in lower extremities, anasarca, dizziness, palpitations, syncope.   GI  No heartburn, indigestion, abdominal pain, nausea, vomiting, diarrhea, change in bowel habits, loss of appetite, bloody stools.   Resp: No shortness of breath with exertion or at rest.  No excess mucus, no productive cough,  No non-productive cough,  No coughing up of blood.  No change in color of mucus.  No wheezing.  No chest wall deformity  Skin: no rash or lesions.  GU: no  dysuria, change in color of urine, no urgency or frequency.  No flank pain, no hematuria   MS:  +joint pain or swelling.  No decreased range of motion.     Psych:  No change in mood or affect. No depression or anxiety.  No memory loss.       Objective:   Physical Exam      WD, WN, 77 y/o WF in NAD;  VS- reviewed. GENERAL:  Alert & oriented; pleasant & cooperative... HEENT:  [place]/AT, EOM-full, PERRLA, EACs-clear (she has bilat hearing aides), TMs-wnl, NOSE-clear, THROAT-clear & wnl. NECK:  Supple w/ fair ROM; no JVD; normal carotid impulses w/o bruits; no thyromegaly or nodules palpated; no lymphadenopathy. CHEST:  Clear to P & A; without wheezes/ rales/ or rhonchi heard; pacer in right upper shoulder area. HEART:  Regular gr 2/6 SEM S4 without rubs... ABDOMEN:  Soft & nontender; normal bowel sounds; no organomegaly or masses detected. EXT: without deformities, mild arthritic changes; +varicose veins/ +venous insuffic/ no edema. Left lower leg w/ 1+ edema > Right, tenderness along lateral aspect of left lower leg and ankle. , neg homans sign  NEURO:  Intact w/ no focal deficits noted.   DERM:  No lesions noted; no rash etc...   Assessment & Plan:  DEGENERATIVE JOINT DISEASE Left lower leg/ankle pain suspect is Musculoskeletal in nature however does have some asymmetrical left sided swelling with acute pain  Set up for STAT venous doppler to r/o DVT  Will treat for DJD flare w/ NSAIDS and  Pain meds  Plan:  We are setting you up for a venous doppler of your leg  Aleve Twice daily  For 5 days  Tramadol 50mg  Twice daily  As needed  Pain, may make you sleepy.  Warm compresses to ankle  Please contact office for sooner follow up if symptoms do not improve or worsen or seek emergency care

## 2011-02-14 ENCOUNTER — Encounter (INDEPENDENT_AMBULATORY_CARE_PROVIDER_SITE_OTHER): Payer: MEDICARE | Admitting: *Deleted

## 2011-02-14 DIAGNOSIS — M79605 Pain in left leg: Secondary | ICD-10-CM

## 2011-02-14 DIAGNOSIS — I872 Venous insufficiency (chronic) (peripheral): Secondary | ICD-10-CM

## 2011-02-14 DIAGNOSIS — M79609 Pain in unspecified limb: Secondary | ICD-10-CM

## 2011-02-18 ENCOUNTER — Encounter: Payer: Self-pay | Admitting: Adult Health

## 2011-02-25 ENCOUNTER — Encounter: Payer: Self-pay | Admitting: Adult Health

## 2011-03-12 ENCOUNTER — Telehealth: Payer: Self-pay | Admitting: Internal Medicine

## 2011-03-12 NOTE — Telephone Encounter (Signed)
New Msg: Pt returning call to [name] regarding pt no longer taking cholesterol medication. Please return pt call to discuss further.

## 2011-03-12 NOTE — Telephone Encounter (Signed)
She has been off Simvastatin for 3 weeks and it did not help.  So she has gone back on the medication.  Her legs were the same of the medication

## 2011-03-24 ENCOUNTER — Telehealth: Payer: Self-pay | Admitting: Pulmonary Disease

## 2011-03-24 MED ORDER — VALSARTAN-HYDROCHLOROTHIAZIDE 80-12.5 MG PO TABS: 1.0000 | ORAL_TABLET | Freq: Every day | ORAL | Status: DC

## 2011-03-24 NOTE — Telephone Encounter (Signed)
Called spoke with patient who states that [facility] sent her the generic diovan-hct 80-12.5mg , but reports that she's "never had a problem" with the brand name.  Pt is requesting this be changed even though I explained to her that there is no difference.  Pt stated that she will return [facility] (she did not open it per pt and I informed her that they may not accept it back) and requested that the brand diovan-hct be sent to [facility] on [address] for 90 days. rx sent.  Pt is aware.

## 2011-03-31 ENCOUNTER — Other Ambulatory Visit: Payer: Self-pay | Admitting: Pulmonary Disease

## 2011-04-24 DIAGNOSIS — L97509 Non-pressure chronic ulcer of other part of unspecified foot with unspecified severity: Secondary | ICD-10-CM | POA: Diagnosis not present

## 2011-04-24 DIAGNOSIS — M79609 Pain in unspecified limb: Secondary | ICD-10-CM | POA: Diagnosis not present

## 2011-04-24 DIAGNOSIS — M898X9 Other specified disorders of bone, unspecified site: Secondary | ICD-10-CM | POA: Diagnosis not present

## 2011-04-30 ENCOUNTER — Ambulatory Visit: Payer: MEDICARE | Admitting: Pulmonary Disease

## 2011-05-01 DIAGNOSIS — L97509 Non-pressure chronic ulcer of other part of unspecified foot with unspecified severity: Secondary | ICD-10-CM | POA: Diagnosis not present

## 2011-05-08 DIAGNOSIS — L97509 Non-pressure chronic ulcer of other part of unspecified foot with unspecified severity: Secondary | ICD-10-CM | POA: Diagnosis not present

## 2011-05-14 ENCOUNTER — Ambulatory Visit (INDEPENDENT_AMBULATORY_CARE_PROVIDER_SITE_OTHER): Payer: MEDICARE | Admitting: Pulmonary Disease

## 2011-05-14 ENCOUNTER — Encounter: Payer: Self-pay | Admitting: Pulmonary Disease

## 2011-05-14 ENCOUNTER — Ambulatory Visit (INDEPENDENT_AMBULATORY_CARE_PROVIDER_SITE_OTHER): Payer: MEDICARE | Admitting: Adult Health

## 2011-05-14 ENCOUNTER — Encounter: Payer: Self-pay | Admitting: Adult Health

## 2011-05-14 VITALS — BP 142/66 | HR 70 | Temp 96.9°F | Ht 65.0 in | Wt 153.0 lb

## 2011-05-14 DIAGNOSIS — E785 Hyperlipidemia, unspecified: Secondary | ICD-10-CM

## 2011-05-14 DIAGNOSIS — H6120 Impacted cerumen, unspecified ear: Secondary | ICD-10-CM

## 2011-05-14 DIAGNOSIS — G609 Hereditary and idiopathic neuropathy, unspecified: Secondary | ICD-10-CM

## 2011-05-14 DIAGNOSIS — Z950 Presence of cardiac pacemaker: Secondary | ICD-10-CM

## 2011-05-14 DIAGNOSIS — E039 Hypothyroidism, unspecified: Secondary | ICD-10-CM

## 2011-05-14 DIAGNOSIS — I10 Essential (primary) hypertension: Secondary | ICD-10-CM

## 2011-05-14 DIAGNOSIS — I359 Nonrheumatic aortic valve disorder, unspecified: Secondary | ICD-10-CM

## 2011-05-14 DIAGNOSIS — I872 Venous insufficiency (chronic) (peripheral): Secondary | ICD-10-CM | POA: Diagnosis not present

## 2011-05-14 DIAGNOSIS — M810 Age-related osteoporosis without current pathological fracture: Secondary | ICD-10-CM

## 2011-05-14 DIAGNOSIS — M1990 Unspecified osteoarthritis, unspecified site: Secondary | ICD-10-CM

## 2011-05-14 DIAGNOSIS — F411 Generalized anxiety disorder: Secondary | ICD-10-CM

## 2011-05-14 MED ORDER — ALENDRONATE SODIUM 70 MG PO TABS: 70.0000 mg | ORAL_TABLET | ORAL | Status: DC

## 2011-05-14 NOTE — Patient Instructions (Signed)
Today we updated your med list in our EPIC system...    Continue your current medications the same...    We refilled your ALENDRONATE per request...  Stay as active as possible, and BE CAREFUL...  Call for any questions...  Let's plan a follow up visit in 4 months.Marland KitchenMarland Kitchen

## 2011-05-14 NOTE — Progress Notes (Signed)
  Subjective:    Patient ID: Kayla Davis, female    DOB: 1922-04-20, 78 y.o.   MRN: [id number]  HPI  05/14/2011  In today to see Dr. Kriste Basque  And found to have bilateral cerumen impaction  Sent to me for ear irrigation.  Decreased hearing for several weeks despite hearing aides.    Review of Systems +decreased hearing     Objective:   Physical Exam ENT: bilateral cerumen impaction  Ear irrigation with cerumen removal  Tolerated well         Assessment & Plan:  Bilateral cerumen impaction -resolved with ear irrigation  May use debrox otc  follow up As needed  And as planned with Dr. Kriste Basque

## 2011-05-14 NOTE — Progress Notes (Signed)
Subjective:    Patient ID: Kayla Davis, female    DOB: 1922-09-29, 78 y.o.   MRN: [id number]  HPI 78 y/o WF here for a follow up visit... she has mult med problems including:  Recurrent bronchitic infections;  HBP;  Aortic valve dis;  Cardiac pacemaker;  Ven insuffic;  Hyperlipidemia;  DM;  Hypothyroid;  Divertics/ constip;  DJD/ LBP;  Osteopenia;  Hx TIA;  Periph neuropathy;  Anxiety;  Anemia; and hx Shingles...  ~  July 30, 2010:  [name] & she has had an eventful yr w/ [facility] 10/11 for presyncope w/ LBBB & pauses ==> pacer placed & she is now followed in the device clinic & saw DrAllred 2/12 doing satis;  She continues to complain about leg cramps- now mostly at night & we discussed Tonic water, Mustard, etc;  Her son brought her some "quinine pills" from ?[employer] (homeopathic under the tongue tab) & she will try this if she awakes w/ the discomfort;  She has f/u w/ DrRamos for epid shots (2/3 have helped)...  Otherw stable> BP controlled, no recent CP, due for f/u FLP & DM labs, etc (see below)>  ~  December 25, 2010:  79mo ROV & recheck from recent eval by TP> seen 8/12 w/ RLL pneumonia on CXR, treated w/ Avelox & improved, some XRay lag time but CXR today back to baseline...    Hx HBP controlled on DiovanHCT w/ BP= 146/70 today & she denies HA, visual symptoms, CP, palpit, syncope, ch in dyspnea, edema;  she has pacer, LBBB, AoV dis- followed by DrAllred for cards...  ~  May 14, 2011:  14mo ROV & she has been under the care of Podiatrist DrPetrinitz for ?toe infection treated w/ Cipro & Clinda==> improved;  She had [facility] screen & brings report of:  1) mild bilat carotid stenoses;  2) Neg abd ao Sonar w/o aneurysm;  3) ABIs are wnl;  4) BMI is wnl at 24 (149#, 60.5" tall)...    <Hx Bronchitis, Pneumonia> resolved, no resid infection, CXR 9/12 reviewed...    <HBP> controlled on DiovanHCT 80-12.5; BP= 142/66 & she denies CP, palpit, dizzy, SOB, edema, etc...    <Cardiac pacer>  followed by DrAllred for presyncope, LBBB, and pauses; seen 10/12 & pacer functioning well, continue same meds...    <Ven Insuffic> she had neg ven dopplers 11/12; has mod VI, chr leg symptoms==> stable...    <Chol> on Simva40 & FishOil; she has stopped it on numerous occas to see if leg symptoms improved==> no better off this med; FLP 4/12 looked good on Simva40...    <Hypothyroid> stable on Synthroid 161mcg/d...    <DJD, LBP> she has mult somatic complaints; ?neurogenic claudication==> she has tried PT, water exercise, meds, ESI, etc...           Problem List:  HEARING LOSS (ICD-389.9) - she has bilat hearing aides.  Hx of BRONCHITIS, RECURRENT >> Hx Pneumonia >> ~  8/12: she had CAP in RLL treated w/ Avelox & slowly resolved back to baseline... ~  CXR 9/12 w/ sl pectus excavatum, mod thor spondylosis, mild cardiomeg, pacer, mild left base scarring, NAD...  HYPERTENSION (ICD-401.9) - controlled on DIOVAN/Hct 80-12.5 daily...  ~  4/12:  BP=120/58 and tol med well... she denies HA, fatigue, visual changes, CP, palipit, ch in dyspnea, edema, etc... ~  1/13:  BP= 142/66 & she remains largely asymptomatic...   CHEST WALL PAIN, HX OF (ICD-V15.89)  AORTIC VALVE DISORDERS (ICD-424.1)- s/p cardiac work  up in 2001... currently denies CP etc... ~  NuclearStressTest 3/01 showed impaired exerc capacity & ST depression but no ischemia or infarction on images and EF=72%... ~  2DEcho 10/06 showed no regional wall motion abn, EF= 70%, mild incr AV thickness w/ reduced excursion, mild AI, sl decr RV function. ~  2DEcho 5/11 showed norm LV size & function, EF=55-60%, mild AI w/ sl thickening of valve, mild MR, mod TR, grI DD...  PACER for Pre-syncope>  Hosp 10/11 for presyncope w/ LBBB & pauses ==> pacer placed & she is now stable, asymptomatic, & followed in the device clinic by DrAllred... ~  10/12: she had f/u DrAllred, doing well, normal pacer function; she was complaining of leg weakness she though  due to statin rx & they held it for a month...  VENOUS INSUFFICIENCY (ICD-459.81) - she has mod VV, VI, & chr leg symptoms... good arterial pulses distally, no ischemic symptoms. ~  11/12:  Venous Dopplers done for leg pain were neg for DVT, no superfic thrombus or incompetence either...  HYPERLIPIDEMIA (ICD-272.4) - on SIMVASTATIN 40mg /d & she has stopped it on several occas due to leg discomfort to see if this would help- it has never lead to improved leg symptoms!... also takes FISH OIL daily. ~  FLP 4/08 on Simvastatin 40mg /d showed TChol 171, TG 88, HDL 58, LDL 96... ~  FLP 6/09 showed TChol 220, TG 98, HDL 58, LDL 143... rec to restart her Simvast40. ~  10/10: not fasting at present & we discussed f/u FLP on ret in 2011 ~  FLP 4/11 showed TChol 176, TG 81, HDL 65, LDL 95 ~  FLP 4/12 showed TChol 189, TG 103, HDL 70, LDL 99  DIABETES MELLITUS, BORDERLINE (ICD-790.29) - on diet alone...  ~  labs 4/08 w/ BS= 106... ~  labs 2/09 w/ BS= 170, HgA1c= 6.7.Marland KitchenMarland Kitchen rec to start Metformin 500mg /d but she declined, prefer diet... ~  labs 6/09 showed BS= 97, HgA1c= 6.6.Marland KitchenMarland Kitchen rec- continue diet Rx. ~  labs 3/10 showed BS= 129, A1c= 6.6 ~  labs 9/10 showed BS= 108 ~  labs 4/11 showed BS= 96 ~  Labs 4/12 showed BS= 89  HYPOTHYROIDISM (ICD-244.9) - on SYNTHROID 132mcg/d... ~  TSH 4/08 = 1.45... keep same dose. ~  TSH 2/09 = 0.17 on 153mcg/d dose... rec to decr to 127mcg/d... ~  TSH 6/09 = 6.20 on 132mcg/d dose... she wants ret to dose. ~  TSH 3/10 = 0.41 on 139mcg/d dose. ~  TSH 4/11 = 0.24 on 154mcg/d dose... She doesn't want to decr the dose. ~  TSH 4/12 = 0.83 on 139mcg/d dose  DIVERTICULOSIS OF COLON (ICD-562.10) & CONSTIPATION (ICD-564.00) - we discussed a regimen to prevent constipation & she uses MIRALAX Prn... ~  Flex Sig 6/96 by DrSam showed divertics, otherw normal left colon...  DEGENERATIVE JOINT DISEASE (ICD-715.90) - she takes Glucosamine/ Chondroitin... LOW BACK PAIN & ?Neurogenic  claudication in legs> ~  she persisits w/ mult somatic complaints--- she states she has tried off Simvastatin, Lyrica, Fosamax & others but nothing helps and her symptoms persist... XRays showed mild DJD both hips & lower spine; & BMD 5/09 showed min osteopenia w/ TScores -0.0 in Spine & -1.4 in right FemNeck... she has tried Rx w/ Tramadol, Advil, Osteobiflex, heat, etc...  ~  eval by Ortho- DrRamos w/ LBP, multilevel DDD, spondylolisthesis L5-S1 & bilat leg pain> she's tried phys therapy, water exercises, meds (no benefit); epid steroid shots have helped some...  OSTEOPOROSIS (ICD-733.00) -  on ALENDRONATE 70mg /wk, calcium, vitamins... ~  BMD here 5/09 showed TScores of 0.00 in Spine, and -1.4 in the right Fem Neck ~  BMD here 9/11 showed TScores of +0.8 in Spine, and -1.7 in the right [facility]... rec to continue same med.  Hx of TRANSIENT ISCHEMIC ATTACK (ICD-435.9) - she remains on ASA and has not had any new cerebral ischemic symptoms...  PERIPHERAL NEUROPATHY (ICD-356.9) - c/o chronic leg discomfort as noted... extensive work up in the past- DrStuckey 2001 w/ normal art dopplers; DrLove in 2001 w/ a neg NCV study;  DrAplington w/ meds and TENS trial (per her request- it helped her brother)... now DrRamos w/ PT, water exercises, and epid shots... tried Lyrica w/o benefit...  ANXIETY (ICD-300.00) - she declines anxiolytic Rx.  SHINGLES (ICD-053.9) - developed shingles Mar09 involving right sacral dermatomes w/ rash and pain in the perineal and buttock areas... treated via ER & several visits w/ NP w/ Valtrex, Pred, Lyrica, Vicodin... lesions resolved, some persist post-herpetic neuralgia...  ANEMIA, MILD (ICD-285.9) ~  labs 9/09 showed Hg= 11.8.[name]. ~  labs 3/10 showed Hg= 11.6, Fe= 64 (sat=20%) ~  labs 4/11 showed = Hg= 11.4 ~  Labs 4/12 showed Hg= 11.9  HEALTH MAINTENANCE:  she had the 2010 Flu shot 9/10... PNEUMOVAX in 2006 age age 38...   Past Surgical History  Procedure Date  .  Pacemaker insertion     MDT  . Appendectomy   . Cholecystectomy   . Tonsillectomy   . Vaginal hysterectomy     Outpatient Encounter Prescriptions as of 05/14/2011  Medication Sig Dispense Refill  . alendronate (FOSAMAX) 70 MG tablet Take 70 mg by mouth every 7 (seven) days. Take with a full glass of water on an empty stomach.       . Alpha-Lipoic Acid 200 MG CAPS Take 1 capsule by mouth daily.        [name] aspirin 325 MG tablet Take 325 mg by mouth daily.        . Calcium Carbonate-Vitamin D (CALTRATE 600+D) 600-400 MG-UNIT per tablet Take 1 tablet by mouth daily.        . carisoprodol (SOMA) 250 MG tablet Take 250 mg by mouth at bedtime. prn       . cholecalciferol (VITAMIN D) 1000 UNITS tablet Take 1,000 Units by mouth daily.        [name] co-enzyme Q-10 30 MG capsule Take 30 mg by mouth daily.        . Cyanocobalamin (B-12) 2500 MCG TABS Take 1 tablet by mouth every other day.       . docusate sodium (COLACE) 100 MG capsule Take 100 mg by mouth daily as needed.        . fish oil-omega-3 fatty acids 1000 MG capsule Take 1 g by mouth daily.        [name] glucose blood test strip 1 each by Other route as needed. Use as instructed       . Homeopathic Products ([employer] LEG CRAMPS PAIN RELIEF PO) Take by mouth. 3 tablets a day       . levothyroxine (SYNTHROID, LEVOTHROID) 125 MCG tablet Take 1 tablet (125 mcg total) by mouth daily.  90 tablet  3  . polyethylene glycol (MIRALAX / GLYCOLAX) packet Take 17 g by mouth daily as needed.       . Probiotic Product (PROBIOTIC FORMULA) CAPS Take 1 capsule by mouth daily.        . simvastatin (ZOCOR) 40 MG tablet  Take 40 mg by mouth daily.        . traMADol (ULTRAM) 50 MG tablet Take 1 tablet (50 mg total) by mouth every 6 (six) hours as needed for pain. Maximum dose= 8 tablets per day  20 tablet  0  . valsartan-hydrochlorothiazide (DIOVAN-HCT) 80-12.5 MG per tablet TAKE 1 TABLET BY MOUTH DAILY  90 tablet  3    Allergies  Allergen Reactions  . Alprazolam      REACTION: eyes swell up  . Penicillins     REACTION: swells up all over body, pt states she almost died  . Sulfonamide Derivatives     REACTION: itching    Current Medications, Allergies, Past Medical History, Past Surgical History, Family History, and Social History were reviewed in [employer] record.    Review of Systems         See HPI - all other systems neg except as noted... The patient complains of decreased hearing, dyspnea on exertion, and difficulty walking.  The patient denies anorexia, fever, weight loss, weight gain, vision loss, hoarseness, chest pain, syncope, peripheral edema, prolonged cough, headaches, hemoptysis, abdominal pain, melena, hematochezia, severe indigestion/heartburn, hematuria, incontinence, muscle weakness, suspicious skin lesions, transient blindness, depression, unusual weight change, abnormal bleeding, enlarged lymph nodes, and angioedema.    Objective:   Physical Exam      WD, WN, 78 y/o WF in NAD;  VS- reviewed. GENERAL:  Alert & oriented; pleasant & cooperative... HEENT:  [place]/AT, EOM-full, PERRLA, EACs-clear (she has bilat hearing aides), TMs-wnl, NOSE-clear, THROAT-clear & wnl. NECK:  Supple w/ fair ROM; no JVD; normal carotid impulses w/o bruits; no thyromegaly or nodules palpated; no lymphadenopathy. CHEST:  Clear to P & A; without wheezes/ rales/ or rhonchi heard; pacer in right upper shoulder area. HEART:  Regular gr 2/6 SEM S4 without rubs... ABDOMEN:  Soft & nontender; normal bowel sounds; no organomegaly or masses detected. EXT: without deformities, mild arthritic changes; +varicose veins/ +venous insuffic/ no edema. NEURO:  CN's intact; no focal neuro deficits x decr sensation in LE's c/w periph neuropathy.  DERM:  No lesions noted; no rash etc...  RADIOLOGY DATA:  Reviewed in the EPIC EMR & discussed w/ the patient...  LABORATORY DATA:  Reviewed in the EPIC EMR & discussed w/ the patient...   Assessment & Plan:    Hx of PNEUMONIA>  RLL pneumonia resolved now, underlying COPD, hx recurrent bronchitis...  HBP>  Controlled on Doivan/HCT;  Continue same...  CARDIAC>  Hx CWP, AoV dis, Pacer;  Stable now & doing well w/o symptoms;  She will continue f/u in Pacer clinic...  LIPIDS>  On Simva40 + CoQ10;  She will ret for f/u FLP & we will notify her of the results...  DM>  On diet alone;  Labs have beed good & she will ret for fasting blood work later this week...  HYPOTHYROID>  Stable on the Synth125 but TSH was sl over suppressed last yr;  We will recheck again & make recommendations when the labs return...  ORTHO>  She has DJD, LBP, leg pain> she continues f/u by DrRamos for shots prn, doesn't want pain meds, we reviewed her predicament...  Noct leg pain>  This seems like an additional prob & rec to try tonic water, mustard, & the homeopathic quinine prep that her son found for her to put under her tongue...  Other medical problems as noted...   Patient's Medications  New Prescriptions   No medications on file  Previous Medications  ALPHA-LIPOIC ACID 200 MG CAPS    Take 1 capsule by mouth daily.     ASPIRIN 325 MG TABLET    Take 325 mg by mouth daily.     CALCIUM CARBONATE-VITAMIN D (CALTRATE 600+D) 600-400 MG-UNIT PER TABLET    Take 1 tablet by mouth daily.     CARISOPRODOL (SOMA) 250 MG TABLET    Take 250 mg by mouth at bedtime. prn    CHOLECALCIFEROL (VITAMIN D) 1000 UNITS TABLET    Take 1,000 Units by mouth daily.     CO-ENZYME Q-10 30 MG CAPSULE    Take 30 mg by mouth daily.     CYANOCOBALAMIN (B-12) 2500 MCG TABS    Take 1 tablet by mouth every other day.    DOCUSATE SODIUM (COLACE) 100 MG CAPSULE    Take 100 mg by mouth daily as needed.     FISH OIL-OMEGA-3 FATTY ACIDS 1000 MG CAPSULE    Take 1 g by mouth daily.     GLUCOSE BLOOD TEST STRIP    1 each by Other route as needed. Use as instructed    HOMEOPATHIC PRODUCTS ([employer] LEG CRAMPS PAIN RELIEF PO)    Take by mouth. 3 tablets a day     LEVOTHYROXINE (SYNTHROID, LEVOTHROID) 125 MCG TABLET    Take 1 tablet (125 mcg total) by mouth daily.   POLYETHYLENE GLYCOL (MIRALAX / GLYCOLAX) PACKET    Take 17 g by mouth daily as needed.    PROBIOTIC PRODUCT (PROBIOTIC FORMULA) CAPS    Take 1 capsule by mouth daily.     SIMVASTATIN (ZOCOR) 40 MG TABLET    Take 40 mg by mouth daily.     TRAMADOL (ULTRAM) 50 MG TABLET    Take 1 tablet (50 mg total) by mouth every 6 (six) hours as needed for pain. Maximum dose= 8 tablets per day   VALSARTAN-HYDROCHLOROTHIAZIDE (DIOVAN-HCT) 80-12.5 MG PER TABLET    TAKE 1 TABLET BY MOUTH DAILY  Modified Medications   Modified Medication Previous Medication   ALENDRONATE (FOSAMAX) 70 MG TABLET alendronate (FOSAMAX) 70 MG tablet      Take 1 tablet (70 mg total) by mouth every 7 (seven) days. Take with a full glass of water on an empty stomach.    Take 70 mg by mouth every 7 (seven) days. Take with a full glass of water on an empty stomach.   Discontinued Medications   No medications on file

## 2011-05-15 ENCOUNTER — Encounter: Payer: MEDICARE | Admitting: *Deleted

## 2011-05-19 ENCOUNTER — Encounter: Payer: Self-pay | Admitting: *Deleted

## 2011-05-24 ENCOUNTER — Encounter: Payer: Self-pay | Admitting: Internal Medicine

## 2011-05-26 ENCOUNTER — Ambulatory Visit (INDEPENDENT_AMBULATORY_CARE_PROVIDER_SITE_OTHER): Payer: MEDICARE | Admitting: *Deleted

## 2011-05-26 DIAGNOSIS — I495 Sick sinus syndrome: Secondary | ICD-10-CM | POA: Diagnosis not present

## 2011-05-27 ENCOUNTER — Telehealth: Payer: Self-pay | Admitting: Internal Medicine

## 2011-05-27 NOTE — Telephone Encounter (Signed)
New msg Pt wants to make sure her transmission went thru. Please let her know

## 2011-05-27 NOTE — Telephone Encounter (Signed)
Transmission received, patient aware. 

## 2011-06-01 LAB — REMOTE PACEMAKER DEVICE
AL THRESHOLD: 0.5 V
ATRIAL PACING PM: 20
RV LEAD IMPEDENCE PM: 555 Ohm
RV LEAD THRESHOLD: 0.5 V

## 2011-06-05 DIAGNOSIS — L97509 Non-pressure chronic ulcer of other part of unspecified foot with unspecified severity: Secondary | ICD-10-CM | POA: Diagnosis not present

## 2011-06-06 NOTE — Progress Notes (Signed)
PPM remote 

## 2011-06-08 ENCOUNTER — Encounter: Payer: Self-pay | Admitting: Pulmonary Disease

## 2011-06-27 ENCOUNTER — Telehealth: Payer: Self-pay | Admitting: Internal Medicine

## 2011-06-27 NOTE — Telephone Encounter (Signed)
New Msg: Pt calling wanting calling wanting to see if pt remote device check was received. Please return pt call to discuss further.

## 2011-06-27 NOTE — Telephone Encounter (Signed)
Transmission received, patient aware. 

## 2011-07-29 DIAGNOSIS — M47817 Spondylosis without myelopathy or radiculopathy, lumbosacral region: Secondary | ICD-10-CM | POA: Diagnosis not present

## 2011-07-30 ENCOUNTER — Ambulatory Visit (INDEPENDENT_AMBULATORY_CARE_PROVIDER_SITE_OTHER): Payer: MEDICARE | Admitting: Adult Health

## 2011-07-30 ENCOUNTER — Other Ambulatory Visit (INDEPENDENT_AMBULATORY_CARE_PROVIDER_SITE_OTHER): Payer: MEDICARE

## 2011-07-30 ENCOUNTER — Encounter: Payer: Self-pay | Admitting: Adult Health

## 2011-07-30 VITALS — BP 132/82 | HR 60 | Temp 97.0°F | Ht 65.0 in | Wt 149.4 lb

## 2011-07-30 DIAGNOSIS — R300 Dysuria: Secondary | ICD-10-CM

## 2011-07-30 DIAGNOSIS — G609 Hereditary and idiopathic neuropathy, unspecified: Secondary | ICD-10-CM

## 2011-07-30 LAB — URINALYSIS, ROUTINE W REFLEX MICROSCOPIC
Bilirubin Urine: NEGATIVE
Nitrite: NEGATIVE
Total Protein, Urine: NEGATIVE

## 2011-07-30 MED ORDER — PREGABALIN 75 MG PO CAPS: 75.0000 mg | ORAL_CAPSULE | Freq: Every day | ORAL | Status: DC

## 2011-07-30 NOTE — Progress Notes (Signed)
Subjective:    Patient ID: Kayla Davis, female    DOB: 06-10-1922, 78 y.o.   MRN: [id number]  HPI 78 y/o WF    Recurrent bronchitic infections;  HBP;  Aortic valve dis;  Cardiac pacemaker;  Ven insuffic;  Hyperlipidemia;  DM;  Hypothyroid;  Divertics/ constip;  DJD/ LBP;  Osteopenia;  Hx TIA;  Periph neuropathy;  Anxiety;  Anemia; and hx Shingles...  ~  July 30, 2010:  [name] & she has had an eventful yr w/ [name] 10/11 for presyncope w/ LBBB & pauses ==> pacer placed & she is now followed in the device clinic & saw DrAllred 2/12 doing satis;  She continues to complain about leg cramps- now mostly at night & we discussed Tonic water, Mustard, etc;  Her son brought her some "quinine pills" from ?[employer] (homeopathic under the tongue tab) & she will try this if she awakes w/ the discomfort;  She has f/u w/ DrRamos for epid shots (2/3 have helped)...  Otherw stable> BP controlled, no recent CP, due for f/u FLP & DM labs, etc (see below)>  ~  December 25, 2010:  21mo ROV & recheck from recent eval by TP> seen 8/12 w/ RLL pneumonia on CXR, treated w/ Avelox & improved, some XRay lag time but CXR today back to baseline...    Hx HBP controlled on DiovanHCT w/ BP= 146/70 today & she denies HA, visual symptoms, CP, palpit, syncope, ch in dyspnea, edema;  she has pacer, LBBB, AoV dis- followed by DrAllred for cards...  ~  May 14, 2011:  36mo ROV & she has been under the care of Podiatrist DrPetrinitz for ?toe infection treated w/ Cipro & Clinda==> improved;  She had [facility] screen & brings report of:  1) mild bilat carotid stenoses;  2) Neg abd ao Sonar w/o aneurysm;  3) ABIs are wnl;  4) BMI is wnl at 24 (149#, 60.5" tall)...    <Hx Bronchitis, Pneumonia> resolved, no resid infection, CXR 9/12 reviewed...    <HBP> controlled on DiovanHCT 80-12.5; BP= 142/66 & she denies CP, palpit, dizzy, SOB, edema, etc...    <Cardiac pacer> followed by DrAllred for presyncope, LBBB, and pauses; seen 10/12 &  pacer functioning well, continue same meds...    <Ven Insuffic> she had neg ven dopplers 11/12; has mod VI, chr leg symptoms==> stable...    <Chol> on Simva40 & FishOil; she has stopped it on numerous occas to see if leg symptoms improved==> no better off this med; FLP 4/12 looked good on Simva40...    <Hypothyroid> stable on Synthroid 136mcg/d...    <DJD, LBP> she has mult somatic complaints; ?neurogenic claudication==> she has tried PT, water exercise, meds, ESI, etc...     07/30/2011 Acute OV  Complains of unable to hear, wants her ears washed out . She has a [employer] technologist at [employer], and she wants to be able here. This upcoming weekend. Denies any ear pain  Also , urine dark and strong odor, wants to have it checked for UTI.  No dysuria or fever.   Patient was previously on Lyrica for peripheral neuropathy and postherpetic neuralgia. She stopped this several months ago. She requests to restart as feels that her neuropathy in her lower legs has worsened since being off the Lyrica. She was recently seen by her orthopedist with an injection in her back for low back pain. She denies any calf pain, leg swelling, fever, or redness. Describes a tingling sensation in the lower legs bilaterally and  in the toes. She also has a history of diabetes. Last hemoglobin A1c was 6.6 on diet control.        Problem List:  HEARING LOSS (ICD-389.9) - she has bilat hearing aides.  Hx of BRONCHITIS, RECURRENT >> Hx Pneumonia >> ~  8/12: she had CAP in RLL treated w/ Avelox & slowly resolved back to baseline... ~  CXR 9/12 w/ sl pectus excavatum, mod thor spondylosis, mild cardiomeg, pacer, mild left base scarring, NAD...  HYPERTENSION (ICD-401.9) - controlled on DIOVAN/Hct 80-12.5 daily...  ~  4/12:  BP=120/58 and tol med well... she denies HA, fatigue, visual changes, CP, palipit, ch in dyspnea, edema, etc... ~  1/13:  BP= 142/66 & she remains largely asymptomatic...   CHEST WALL PAIN, HX OF (ICD-V15.89)    AORTIC VALVE DISORDERS (ICD-424.1)- s/p cardiac work up in 2001... currently denies CP etc... ~  NuclearStressTest 3/01 showed impaired exerc capacity & ST depression but no ischemia or infarction on images and EF=72%... ~  2DEcho 10/06 showed no regional wall motion abn, EF= 70%, mild incr AV thickness w/ reduced excursion, mild AI, sl decr RV function. ~  2DEcho 5/11 showed norm LV size & function, EF=55-60%, mild AI w/ sl thickening of valve, mild MR, mod TR, grI DD...  PACER for Pre-syncope>  Hosp 10/11 for presyncope w/ LBBB & pauses ==> pacer placed & she is now stable, asymptomatic, & followed in the device clinic by DrAllred... ~  10/12: she had f/u DrAllred, doing well, normal pacer function; she was complaining of leg weakness she though due to statin rx & they held it for a month...  VENOUS INSUFFICIENCY (ICD-459.81) - she has mod VV, VI, & chr leg symptoms... good arterial pulses distally, no ischemic symptoms. ~  11/12:  Venous Dopplers done for leg pain were neg for DVT, no superfic thrombus or incompetence either...  HYPERLIPIDEMIA (ICD-272.4) - on SIMVASTATIN 40mg /d & she has stopped it on several occas due to leg discomfort to see if this would help- it has never lead to improved leg symptoms!... also takes FISH OIL daily. ~  FLP 4/08 on Simvastatin 40mg /d showed TChol 171, TG 88, HDL 58, LDL 96... ~  FLP 6/09 showed TChol 220, TG 98, HDL 58, LDL 143... rec to restart her Simvast40. ~  10/10: not fasting at present & we discussed f/u FLP on ret in 2011 ~  FLP 4/11 showed TChol 176, TG 81, HDL 65, LDL 95 ~  FLP 4/12 showed TChol 189, TG 103, HDL 70, LDL 99  DIABETES MELLITUS, BORDERLINE (ICD-790.29) - on diet alone...  ~  labs 4/08 w/ BS= 106... ~  labs 2/09 w/ BS= 170, HgA1c= 6.7.Marland KitchenMarland Kitchen rec to start Metformin 500mg /d but she declined, prefer diet... ~  labs 6/09 showed BS= 97, HgA1c= 6.6.Marland KitchenMarland Kitchen rec- continue diet Rx. ~  labs 3/10 showed BS= 129, A1c= 6.6 ~  labs 9/10 showed BS=  108 ~  labs 4/11 showed BS= 96 ~  Labs 4/12 showed BS= 89  HYPOTHYROIDISM (ICD-244.9) - on SYNTHROID 142mcg/d... ~  TSH 4/08 = 1.45... keep same dose. ~  TSH 2/09 = 0.17 on 137mcg/d dose... rec to decr to 16mcg/d... ~  TSH 6/09 = 6.20 on 170mcg/d dose... she wants ret to dose. ~  TSH 3/10 = 0.41 on 167mcg/d dose. ~  TSH 4/11 = 0.24 on 168mcg/d dose... She doesn't want to decr the dose. ~  TSH 4/12 = 0.83 on 157mcg/d dose  DIVERTICULOSIS OF COLON (ICD-562.10) &  CONSTIPATION (ICD-564.00) - we discussed a regimen to prevent constipation & she uses MIRALAX Prn... ~  Flex Sig 6/96 by DrSam showed divertics, otherw normal left colon...  DEGENERATIVE JOINT DISEASE (ICD-715.90) - she takes Glucosamine/ Chondroitin... LOW BACK PAIN & ?Neurogenic claudication in legs> ~  she persisits w/ mult somatic complaints--- she states she has tried off Simvastatin, Lyrica, Fosamax & others but nothing helps and her symptoms persist... XRays showed mild DJD both hips & lower spine; & BMD 5/09 showed min osteopenia w/ TScores -0.0 in Spine & -1.4 in right FemNeck... she has tried Rx w/ Tramadol, Advil, Osteobiflex, heat, etc...  ~  eval by Ortho- DrRamos w/ LBP, multilevel DDD, spondylolisthesis L5-S1 & bilat leg pain> she's tried phys therapy, water exercises, meds (no benefit); epid steroid shots have helped some...  OSTEOPOROSIS (ICD-733.00) - on ALENDRONATE 70mg /wk, calcium, vitamins... ~  BMD here 5/09 showed TScores of 0.00 in Spine, and -1.4 in the right Fem Neck ~  BMD here 9/11 showed TScores of +0.8 in Spine, and -1.7 in the right [facility]... rec to continue same med.  Hx of TRANSIENT ISCHEMIC ATTACK (ICD-435.9) - she remains on ASA and has not had any new cerebral ischemic symptoms...  PERIPHERAL NEUROPATHY (ICD-356.9) - c/o chronic leg discomfort as noted... extensive work up in the past- DrStuckey 2001 w/ normal art dopplers; DrLove in 2001 w/ a neg NCV study;  DrAplington w/ meds and TENS  trial (per her request- it helped her brother)... now DrRamos w/ PT, water exercises, and epid shots... tried Lyrica w/o benefit...  ANXIETY (ICD-300.00) - she declines anxiolytic Rx.  SHINGLES (ICD-053.9) - developed shingles Mar09 involving right sacral dermatomes w/ rash and pain in the perineal and buttock areas... treated via ER & several visits w/ NP w/ Valtrex, Pred, Lyrica, Vicodin... lesions resolved, some persist post-herpetic neuralgia...  ANEMIA, MILD (ICD-285.9) ~  labs 9/09 showed Hg= 11.8.[name]. ~  labs 3/10 showed Hg= 11.6, Fe= 64 (sat=20%) ~  labs 4/11 showed = Hg= 11.4 ~  Labs 4/12 showed Hg= 11.9  HEALTH MAINTENANCE:  she had the 2010 Flu shot 9/10... PNEUMOVAX in 2006 age age 72...   Past Surgical History  Procedure Date  . Pacemaker insertion     MDT  . Appendectomy   . Cholecystectomy   . Tonsillectomy   . Vaginal hysterectomy     Outpatient Encounter Prescriptions as of 07/30/2011  Medication Sig Dispense Refill  . alendronate (FOSAMAX) 70 MG tablet Take 1 tablet (70 mg total) by mouth every 7 (seven) days. Take with a full glass of water on an empty stomach.  12 tablet  3  . Alpha-Lipoic Acid 200 MG CAPS Take 1 capsule by mouth daily.        [name] aspirin 325 MG tablet Take 325 mg by mouth daily.        . Calcium Carbonate-Vitamin D (CALTRATE 600+D) 600-400 MG-UNIT per tablet Take 1 tablet by mouth daily.        . carisoprodol (SOMA) 250 MG tablet Take 250 mg by mouth at bedtime. prn       . cholecalciferol (VITAMIN D) 1000 UNITS tablet Take 1,000 Units by mouth daily.        [name] co-enzyme Q-10 30 MG capsule Take 30 mg by mouth daily.        . Cyanocobalamin (B-12) 2500 MCG TABS Take 1 tablet by mouth every other day.       . docusate sodium (COLACE) 100 MG capsule Take  100 mg by mouth daily as needed.        . fish oil-omega-3 fatty acids 1000 MG capsule Take 1 g by mouth daily.        [name] glucose blood test strip 1 each by Other route as needed. Use as instructed        . Homeopathic Products ([employer] LEG CRAMPS PAIN RELIEF PO) Take by mouth. 3 tablets a day       . levothyroxine (SYNTHROID, LEVOTHROID) 125 MCG tablet Take 1 tablet (125 mcg total) by mouth daily.  90 tablet  3  . polyethylene glycol (MIRALAX / GLYCOLAX) packet Take 17 g by mouth daily as needed.       . Probiotic Product (PROBIOTIC FORMULA) CAPS Take 1 capsule by mouth daily.        . simvastatin (ZOCOR) 40 MG tablet Take 40 mg by mouth daily.        . traMADol (ULTRAM) 50 MG tablet Take 1 tablet (50 mg total) by mouth every 6 (six) hours as needed for pain. Maximum dose= 8 tablets per day  20 tablet  0  . valsartan-hydrochlorothiazide (DIOVAN-HCT) 80-12.5 MG per tablet TAKE 1 TABLET BY MOUTH DAILY  90 tablet  3    Allergies  Allergen Reactions  . Alprazolam     REACTION: eyes swell up  . Penicillins     REACTION: swells up all over body, pt states she almost died  . Sulfonamide Derivatives     REACTION: itching    Current Medications, Allergies, Past Medical History, Past Surgical History, Family History, and Social History were reviewed in [employer] record.    Review of Systems        Constitutional:   No  weight loss, night sweats,  Fevers, chills, + fatigue, or  lassitude.  HEENT:   No headaches,  Difficulty swallowing,  Tooth/dental problems, or  Sore throat,                No sneezing, itching, ear ache, nasal congestion, post nasal drip,   CV:  No chest pain,  Orthopnea, PND, swelling in lower extremities, anasarca, dizziness, palpitations, syncope.   GI  No heartburn, indigestion, abdominal pain, nausea, vomiting, diarrhea, change in bowel habits, loss of appetite, bloody stools.   Resp: No shortness of breath with exertion or at rest.  No excess mucus, no productive cough,  No non-productive cough,  No coughing up of blood.  No change in color of mucus.  No wheezing.  No chest wall deformity  Skin: no rash or lesions.  GU: no dysuria, change in  color of urine, no urgency or frequency.  No flank pain, no hematuria   MS:  No joint pain or swelling.    Psych:  No change in mood or affect. No depression or anxiety.  No memory loss.       Objective:   Physical Exam      WD, WN, 78 y/o WF in NAD;  VS- reviewed. GENERAL:  Alert & oriented; pleasant & cooperative... HEENT:  [place]/AT,EAC clear w/ minimal wax, has hearing aids TMs-wnl, NOSE-clear, THROAT-clear & wnl. NECK:  Supple w/ fair ROM; no JVD; normal carotid impulses w/o bruits; no thyromegaly or nodules palpated; no lymphadenopathy. CHEST:  Clear to P & A; without wheezes/ rales/ or rhonchi heard; pacer in right upper shoulder area. HEART:  Regular gr 2/6 SEM S4 without rubs... ABDOMEN:  Soft & nontender; normal bowel sounds; no organomegaly or masses  detected. EXT: without deformities, mild arthritic changes; +varicose veins/ +venous insuffic/ no edema. NEURO:  CN's intact; no focal neuro deficits x decr sensation in LE's c/w periph neuropathy.  DERM:  No lesions noted; no rash etc...     Assessment & Plan:

## 2011-07-30 NOTE — Assessment & Plan Note (Signed)
History of urinary tract, infections. We'll check a urine today

## 2011-07-30 NOTE — Patient Instructions (Signed)
May use Debrox for ear wax.  May restart Lyrica 75mg  At bedtime   Please contact office for sooner follow up if symptoms do not improve or worsen or seek emergency care  follow up Dr. Kriste Basque  In 6 weeks and As needed

## 2011-07-30 NOTE — Assessment & Plan Note (Signed)
May restart Lyrica 75 mg at bedtime. Patient will followup in 6 weeks and as needed

## 2011-08-03 LAB — URINE CULTURE

## 2011-08-05 ENCOUNTER — Telehealth: Payer: Self-pay | Admitting: Pulmonary Disease

## 2011-08-05 DIAGNOSIS — M1990 Unspecified osteoarthritis, unspecified site: Secondary | ICD-10-CM | POA: Diagnosis not present

## 2011-08-05 MED ORDER — DOXYCYCLINE HYCLATE 100 MG PO TABS: 100.0000 mg | ORAL_TABLET | Freq: Two times a day (BID) | ORAL | Status: AC

## 2011-08-05 NOTE — Telephone Encounter (Signed)
Notes Recorded by Julio Sicks, NP on 08/05/2011 at 9:18 AM UTI per uCX Begin Doxycycline 100mg  Twice daily #20 , no refills  Please contact office for sooner follow up if symptoms do not improve or worsen or seek emergency care   I spoke with patient about results and he verbalized understanding and had no questions. rx has been called into the pharmacy and nothing further was needed

## 2011-08-18 ENCOUNTER — Telehealth: Payer: Self-pay | Admitting: Pulmonary Disease

## 2011-08-18 NOTE — Telephone Encounter (Signed)
I spoke with pt and advised her that it does not show where we have tried calling her. She voiced her understanding and needed nothing further

## 2011-08-28 ENCOUNTER — Ambulatory Visit (INDEPENDENT_AMBULATORY_CARE_PROVIDER_SITE_OTHER): Payer: MEDICARE | Admitting: *Deleted

## 2011-08-28 ENCOUNTER — Encounter: Payer: Self-pay | Admitting: Internal Medicine

## 2011-08-28 DIAGNOSIS — I495 Sick sinus syndrome: Secondary | ICD-10-CM | POA: Diagnosis not present

## 2011-08-28 DIAGNOSIS — Z950 Presence of cardiac pacemaker: Secondary | ICD-10-CM

## 2011-08-28 DIAGNOSIS — L84 Corns and callosities: Secondary | ICD-10-CM | POA: Diagnosis not present

## 2011-09-05 ENCOUNTER — Telehealth: Payer: Self-pay | Admitting: Internal Medicine

## 2011-09-05 LAB — REMOTE PACEMAKER DEVICE
AL THRESHOLD: 0.5 V
BAMS-0001: 175 {beats}/min
BATTERY VOLTAGE: 2.78 V
RV LEAD AMPLITUDE: 22.4 mv
RV LEAD THRESHOLD: 0.5 V

## 2011-09-05 NOTE — Telephone Encounter (Signed)
Transmission received, patient aware and results are still pending reading from MD.

## 2011-09-05 NOTE — Telephone Encounter (Signed)
Pt calling for results of transmission from 08-28-11, pls call

## 2011-09-11 ENCOUNTER — Telehealth: Payer: Self-pay | Admitting: Pulmonary Disease

## 2011-09-11 MED ORDER — PREGABALIN 75 MG PO CAPS: 75.0000 mg | ORAL_CAPSULE | Freq: Every day | ORAL | Status: DC

## 2011-09-11 NOTE — Telephone Encounter (Signed)
ATC pt line busy x 3 wcb 

## 2011-09-11 NOTE — Telephone Encounter (Signed)
rx has been printed and placed on SN cart to be signed and once this is done we will fax the rx for 90 day supply to [facility].  lmom to make pt aware.

## 2011-09-16 ENCOUNTER — Encounter: Payer: Self-pay | Admitting: Pulmonary Disease

## 2011-09-16 ENCOUNTER — Ambulatory Visit (INDEPENDENT_AMBULATORY_CARE_PROVIDER_SITE_OTHER): Payer: MEDICARE | Admitting: Pulmonary Disease

## 2011-09-16 VITALS — BP 126/74 | HR 63 | Temp 97.5°F | Ht 65.0 in | Wt 149.2 lb

## 2011-09-16 DIAGNOSIS — M79609 Pain in unspecified limb: Secondary | ICD-10-CM

## 2011-09-16 DIAGNOSIS — M549 Dorsalgia, unspecified: Secondary | ICD-10-CM

## 2011-09-16 DIAGNOSIS — E785 Hyperlipidemia, unspecified: Secondary | ICD-10-CM

## 2011-09-16 DIAGNOSIS — R7309 Other abnormal glucose: Secondary | ICD-10-CM

## 2011-09-16 DIAGNOSIS — Z950 Presence of cardiac pacemaker: Secondary | ICD-10-CM | POA: Diagnosis not present

## 2011-09-16 DIAGNOSIS — J42 Unspecified chronic bronchitis: Secondary | ICD-10-CM

## 2011-09-16 DIAGNOSIS — I872 Venous insufficiency (chronic) (peripheral): Secondary | ICD-10-CM

## 2011-09-16 DIAGNOSIS — E039 Hypothyroidism, unspecified: Secondary | ICD-10-CM

## 2011-09-16 DIAGNOSIS — K5730 Diverticulosis of large intestine without perforation or abscess without bleeding: Secondary | ICD-10-CM

## 2011-09-16 DIAGNOSIS — K589 Irritable bowel syndrome without diarrhea: Secondary | ICD-10-CM

## 2011-09-16 DIAGNOSIS — R0600 Dyspnea, unspecified: Secondary | ICD-10-CM

## 2011-09-16 DIAGNOSIS — D649 Anemia, unspecified: Secondary | ICD-10-CM

## 2011-09-16 DIAGNOSIS — I495 Sick sinus syndrome: Secondary | ICD-10-CM | POA: Diagnosis not present

## 2011-09-16 DIAGNOSIS — M1990 Unspecified osteoarthritis, unspecified site: Secondary | ICD-10-CM

## 2011-09-16 DIAGNOSIS — I10 Essential (primary) hypertension: Secondary | ICD-10-CM | POA: Diagnosis not present

## 2011-09-16 DIAGNOSIS — F411 Generalized anxiety disorder: Secondary | ICD-10-CM

## 2011-09-16 DIAGNOSIS — G609 Hereditary and idiopathic neuropathy, unspecified: Secondary | ICD-10-CM

## 2011-09-16 NOTE — Patient Instructions (Signed)
Today we updated your med list in our EPIC system...    Continue your current medications the same...  Please return to our lab one morning this week for your FASTING blood work...    We will call you w/ the results when avail...  Call for any problems or if we can be of service in any way...  Let's plan a follow up visit in about 6 months.Marland KitchenMarland Kitchen

## 2011-09-16 NOTE — Progress Notes (Signed)
Subjective:    Patient ID: Kayla Davis, female    DOB: 03-08-1923, 78 y.o.   MRN: [id number]  HPI 78 y/o WF here for a follow up visit... she has mult med problems including:  Recurrent bronchitic infections;  HBP;  Aortic valve dis;  Cardiac pacemaker;  Ven insuffic;  Hyperlipidemia;  DM;  Hypothyroid;  Divertics/ constip;  DJD/ LBP;  Osteopenia;  Hx TIA;  Periph neuropathy;  Anxiety;  Anemia; and hx Shingles...  ~  July 30, 2010:  [name] & she has had an eventful yr w/ [facility] 10/11 for presyncope w/ LBBB & pauses ==> pacer placed & she is now followed in the device clinic & saw DrAllred 2/12 doing satis;  She continues to complain about leg cramps- now mostly at night & we discussed Tonic water, Mustard, etc;  Her son brought her some "quinine pills" from ?[employer] (homeopathic under the tongue tab) & she will try this if she awakes w/ the discomfort;  She has f/u w/ DrRamos for epid shots (2/3 have helped)...  Otherw stable> BP controlled, no recent CP, due for f/u FLP & DM labs, etc (see below)>  ~  December 25, 2010:  410mo ROV & recheck from recent eval by TP> seen 8/12 w/ RLL pneumonia on CXR, treated w/ Avelox & improved, some XRay lag time but CXR today back to baseline...    Hx HBP controlled on DiovanHCT w/ BP= 146/70 today & she denies HA, visual symptoms, CP, palpit, syncope, ch in dyspnea, edema;  she has pacer, LBBB, AoV dis- followed by DrAllred for cards...  ~  May 14, 2011:  10mo ROV & she has been under the care of Podiatrist DrPetrinitz for ?toe infection treated w/ Cipro & Clinda==> improved;  She had [facility] screen & brings report of:  1) mild bilat carotid stenoses;  2) Neg abd ao Sonar w/o aneurysm;  3) ABIs are wnl;  4) BMI is wnl at 24 (149#, 60.5" tall)...    <Hx Bronchitis, Pneumonia> resolved, no resid infection, CXR 9/12 reviewed...    <HBP> controlled on DiovanHCT 80-12.5; BP= 142/66 & she denies CP, palpit, dizzy, SOB, edema, etc...    <Cardiac pacer>  followed by DrAllred for presyncope, LBBB, and pauses; seen 10/12 & pacer functioning well, continue same meds...    <Ven Insuffic> she had neg ven dopplers 11/12; has mod VI, chr leg symptoms==> stable...    <Chol> on Simva40 & FishOil; she has stopped it on numerous occas to see if leg symptoms improved==> no better off this med; FLP 4/12 looked good on Simva40...    <Hypothyroid> stable on Synthroid 127mcg/d...    <DJD, LBP> she has mult somatic complaints; ?neurogenic claudication==> she has tried PT, water exercise, meds, ESI, etc...  ~  September 16, 2011:  10mo ROV & [name] is doing very well> she has a relative who is 76 y/o still sharp/ ambulatory/ & makes her own clothes!  Her CC is LBP and neuropathy symptoms> she's received shots from DrRamos w/ help & restarted LYRICA 75mg  Qhs per TP which really helps, she says;  Had UTI 4/13 treated & everything at baseline now...      We reviewed prob list, meds, xrays and labs> see below>> LABS 6/13:  FLP- at goals on Simva40;  Chems- ok w/ BS=86, A1c=6.5  CBC- ok x Hg=11.1;  TSH=2.41;  BNP=111...        Problem List:  HEARING LOSS (ICD-389.9) - she has bilat hearing aides &  [name] has washed out her EACs...  Hx of BRONCHITIS, RECURRENT >> Hx Pneumonia >> ~  8/12: she had CAP in RLL treated w/ Avelox & slowly resolved back to baseline... ~  CXR 9/12 w/ sl pectus excavatum, mod thor spondylosis, mild cardiomeg, pacer, mild left base scarring, NAD...  HYPERTENSION (ICD-401.9) - controlled on DIOVAN/Hct 80-12.5 daily...  ~  4/12:  BP=120/58 and tol med well... she denies HA, fatigue, visual changes, CP, palipit, ch in dyspnea, edema, etc... ~  1/13:  BP= 142/66 & she remains largely asymptomatic...  ~  6/13:  BP= 126/74 & feeling well w/o CP, palpit, dizzy, ch in SOB, edema; Creat=0.7, BNP=111.  CHEST WALL PAIN, HX OF (ICD-V15.89) - on ASA 325mg /d... AORTIC VALVE DISORDERS (ICD-424.1)- s/p cardiac work up in 2001... currently denies CP etc... ~   NuclearStressTest 3/01 showed impaired exerc capacity & ST depression but no ischemia or infarction on images and EF=72%... ~  2DEcho 10/06 showed no regional wall motion abn, EF= 70%, mild incr AV thickness w/ reduced excursion, mild AI, sl decr RV function. ~  2DEcho 5/11 showed norm LV size & function, EF=55-60%, mild AI w/ sl thickening of valve, mild MR, mod TR, grI DD...  PACER for Pre-syncope>  Hosp 10/11 for presyncope w/ LBBB & pauses ==> pacer placed & she is now stable, asymptomatic, & followed in the device clinic by DrAllred... ~  10/12: she had f/u DrAllred, doing well, normal pacer function; she was complaining of leg weakness she though due to statin rx & they held it for a month...  VENOUS INSUFFICIENCY (ICD-459.81) - she has mod VV, VI, & chr leg symptoms... good arterial pulses distally, no ischemic symptoms. ~  11/12:  Venous Dopplers done for leg pain were neg for DVT, no superfic thrombus or incompetence either...  HYPERLIPIDEMIA (ICD-272.4) - on SIMVASTATIN 40mg /d & she has stopped it on several occas due to leg discomfort to see if this would help- it has never lead to improved leg symptoms!... also takes FISH OIL daily. ~  FLP 4/08 on Simvastatin 40mg /d showed TChol 171, TG 88, HDL 58, LDL 96... ~  FLP 6/09 showed TChol 220, TG 98, HDL 58, LDL 143... rec to restart her Simvast40. ~  10/10: not fasting at present & we discussed f/u FLP on ret in 2011 ~  FLP 4/11 showed TChol 176, TG 81, HDL 65, LDL 95 ~  FLP 4/12 showed TChol 189, TG 103, HDL 70, LDL 99 ~  FLP 6/13 on Simva40 showed TChol 161, TG 123, HDL 63, LDL 73  DIABETES MELLITUS, BORDERLINE (ICD-790.29) - on diet alone...  ~  labs 4/08 w/ BS= 106... ~  labs 2/09 w/ BS= 170, HgA1c= 6.7.Marland KitchenMarland Kitchen rec to start Metformin 500mg /d but she declined, prefer diet... ~  labs 6/09 showed BS= 97, HgA1c= 6.6.Marland KitchenMarland Kitchen rec- continue diet Rx. ~  labs 3/10 showed BS= 129, A1c= 6.6 ~  labs 9/10 showed BS= 108 ~  labs 4/11 showed BS= 96 ~   Labs 4/12 showed BS= 89 ~  Labs 6/13 on diet alone showed BS= 86, A1c= 6.5  HYPOTHYROIDISM (ICD-244.9) - on SYNTHROID 171mcg/d... ~  TSH 4/08 = 1.45... keep same dose. ~  TSH 2/09 = 0.17 on 133mcg/d dose... rec to decr to 189mcg/d... ~  TSH 6/09 = 6.20 on 160mcg/d dose... she wants ret to dose. ~  TSH 3/10 = 0.41 on 184mcg/d dose. ~  TSH 4/11 = 0.24 on 137mcg/d dose... She doesn't want to decr  the dose. ~  TSH 4/12 = 0.83 on 161mcg/d dose  DIVERTICULOSIS OF COLON (ICD-562.10) & CONSTIPATION (ICD-564.00) - we discussed a regimen to prevent constipation & she uses MIRALAX Prn... ~  Flex Sig 6/96 by DrSam showed divertics, otherw normal left colon...  DEGENERATIVE JOINT DISEASE (ICD-715.90) - she takes Glucosamine/ Chondroitin... LOW BACK PAIN & ?Neurogenic claudication in legs> ~  she persisits w/ mult somatic complaints--- she states she has tried off Simvastatin, Lyrica, Fosamax & others but nothing helps and her symptoms persist... XRays showed mild DJD both hips & lower spine; & BMD 5/09 showed min osteopenia w/ TScores -0.0 in Spine & -1.4 in right FemNeck... she has tried Rx w/ Tramadol, Advil, Osteobiflex, heat, etc...  ~  eval by Ortho- DrRamos w/ LBP, multilevel DDD, spondylolisthesis L5-S1 & bilat leg pain> she's tried phys therapy, water exercises, meds (no benefit); epid steroid shots have helped some... ~  6/13:  Pt back on LYRICA 75mg  Qhs at her request for neuropathy & improved, she says...  OSTEOPOROSIS (ICD-733.00) - on ALENDRONATE 70mg /wk, calcium, vitamins... ~  BMD here 5/09 showed TScores of 0.00 in Spine, and -1.4 in the right Fem Neck ~  BMD here 9/11 showed TScores of +0.8 in Spine, and -1.7 in the right [facility]... rec to continue same med.  Hx of TRANSIENT ISCHEMIC ATTACK (ICD-435.9) - she remains on ASA and has not had any new cerebral ischemic symptoms...  PERIPHERAL NEUROPATHY (ICD-356.9) - c/o chronic leg discomfort as noted... extensive work up in the  past- DrStuckey 2001 w/ normal art dopplers; DrLove in 2001 w/ a neg NCV study;  DrAplington w/ meds and TENS trial (per her request- it helped her brother)... now DrRamos w/ PT, water exercises, and epid shots... tried Lyrica and now she thinks it's helping...  ANXIETY (ICD-300.00) - she declines anxiolytic Rx.  SHINGLES (ICD-053.9) - developed shingles Mar09 involving right sacral dermatomes w/ rash and pain in the perineal and buttock areas... treated via ER & several visits w/ NP w/ Valtrex, Pred, Lyrica, Vicodin... lesions resolved, some persist post-herpetic neuralgia...  ANEMIA, MILD (ICD-285.9) ~  labs 9/09 showed Hg= 11.8.[name]. ~  labs 3/10 showed Hg= 11.6, Fe= 64 (sat=20%) ~  labs 4/11 showed = Hg= 11.4 ~  Labs 4/12 showed Hg= 11.9 ~  Labs 6/13 showed Hg= 11.1, MCV=95  HEALTH MAINTENANCE:  she had the 2010 Flu shot 9/10... PNEUMOVAX in 2006 age age 14...   Past Surgical History  Procedure Date  . Pacemaker insertion     MDT  . Appendectomy   . Cholecystectomy   . Tonsillectomy   . Vaginal hysterectomy     Outpatient Encounter Prescriptions as of 09/16/2011  Medication Sig Dispense Refill  . alendronate (FOSAMAX) 70 MG tablet Take 1 tablet (70 mg total) by mouth every 7 (seven) days. Take with a full glass of water on an empty stomach.  12 tablet  3  . Alpha-Lipoic Acid 200 MG CAPS Take 1 capsule by mouth daily.        [name] aspirin 325 MG tablet Take 325 mg by mouth daily.        . Calcium Carbonate-Vitamin D (CALTRATE 600+D) 600-400 MG-UNIT per tablet Take 1 tablet by mouth daily.        . cholecalciferol (VITAMIN D) 1000 UNITS tablet Take 1,000 Units by mouth daily.        [name] co-enzyme Q-10 30 MG capsule Take 30 mg by mouth daily.        [name]  Cyanocobalamin (B-12) 2500 MCG TABS Take 1 tablet by mouth every other day.       . docusate sodium (COLACE) 100 MG capsule Take 100 mg by mouth at bedtime.       . fish oil-omega-3 fatty acids 1000 MG capsule Take 1 g by mouth daily.          [name] glucose blood test strip 1 each by Other route as needed. Use as instructed       . Homeopathic Products ([employer] LEG CRAMPS PAIN RELIEF PO) Take by mouth. 3 tablets a day       . levothyroxine (SYNTHROID, LEVOTHROID) 125 MCG tablet Take 1 tablet (125 mcg total) by mouth daily.  90 tablet  3  . polyethylene glycol (MIRALAX / GLYCOLAX) packet Take 17 g by mouth daily as needed.       . pregabalin (LYRICA) 75 MG capsule Take 1 capsule (75 mg total) by mouth at bedtime.  90 capsule  3  . Probiotic Product (PROBIOTIC FORMULA) CAPS Take 1 capsule by mouth daily.        . simvastatin (ZOCOR) 40 MG tablet Take 40 mg by mouth daily.        . valsartan-hydrochlorothiazide (DIOVAN-HCT) 80-12.5 MG per tablet TAKE 1 TABLET BY MOUTH DAILY  90 tablet  3  . DISCONTD: traMADol (ULTRAM) 50 MG tablet Take 1 tablet (50 mg total) by mouth every 6 (six) hours as needed for pain. Maximum dose= 8 tablets per day  20 tablet  0    Allergies  Allergen Reactions  . Alprazolam     REACTION: eyes swell up  . Penicillins     REACTION: swells up all over body, pt states she almost died  . Sulfonamide Derivatives     REACTION: itching    Current Medications, Allergies, Past Medical History, Past Surgical History, Family History, and Social History were reviewed in [employer] record.    Review of Systems         See HPI - all other systems neg except as noted... The patient complains of decreased hearing, dyspnea on exertion, and difficulty walking.  The patient denies anorexia, fever, weight loss, weight gain, vision loss, hoarseness, chest pain, syncope, peripheral edema, prolonged cough, headaches, hemoptysis, abdominal pain, melena, hematochezia, severe indigestion/heartburn, hematuria, incontinence, muscle weakness, suspicious skin lesions, transient blindness, depression, unusual weight change, abnormal bleeding, enlarged lymph nodes, and angioedema.    Objective:   Physical Exam       WD, WN, 78 y/o WF in NAD;  VS- reviewed. GENERAL:  Alert & oriented; pleasant & cooperative... HEENT:  [place]/AT, EOM-full, PERRLA, EACs-clear (she has bilat hearing aides), TMs-wnl, NOSE-clear, THROAT-clear & wnl. NECK:  Supple w/ fair ROM; no JVD; normal carotid impulses w/o bruits; no thyromegaly or nodules palpated; no lymphadenopathy. CHEST:  Clear to P & A; without wheezes/ rales/ or rhonchi heard; pacer in right upper shoulder area. HEART:  Regular gr 2/6 SEM S4 without rubs... ABDOMEN:  Soft & nontender; normal bowel sounds; no organomegaly or masses detected. EXT: without deformities, mild arthritic changes; +varicose veins/ +venous insuffic/ no edema. NEURO:  CN's intact; no focal neuro deficits x decr sensation in LE's c/w periph neuropathy.  DERM:  No lesions noted; no rash etc...  RADIOLOGY DATA:  Reviewed in the EPIC EMR & discussed w/ the patient...  LABORATORY DATA:  Reviewed in the EPIC EMR & discussed w/ the patient...   Assessment & Plan:   Hx of PNEUMONIA>  RLL pneumonia resolved now, underlying COPD, hx recurrent bronchitis...  HBP>  Controlled on Diovan/HCT;  Continue same...  CARDIAC>  Hx CWP, AoV dis, Pacer;  Stable now & doing well w/o symptoms;  She will continue f/u in Pacer clinic...  LIPIDS>  On Simva40 + CoQ10;  She will ret for f/u FLP & we will notify her of the results...  DM>  On diet alone;  Labs have beed good & she will ret for fasting blood work later this week...  HYPOTHYROID>  Stable on the Synth125 but TSH was sl over suppressed last yr;  We will recheck again & make recommendations when the labs return...  ORTHO>  She has DJD, LBP, leg pain> she continues f/u by DrRamos for shots prn, doesn't want pain meds, we reviewed her predicament...  Noct leg pain>  This seems like an additional prob & rec to try tonic water, mustard, & the homeopathic quinine prep that her son found for her to put under her tongue...  Other medical problems as  noted...   Patient's Medications  New Prescriptions   No medications on file  Previous Medications   ALENDRONATE (FOSAMAX) 70 MG TABLET    Take 1 tablet (70 mg total) by mouth every 7 (seven) days. Take with a full glass of water on an empty stomach.   ALPHA-LIPOIC ACID 200 MG CAPS    Take 1 capsule by mouth daily.     ASPIRIN 325 MG TABLET    Take 325 mg by mouth daily.     CALCIUM CARBONATE-VITAMIN D (CALTRATE 600+D) 600-400 MG-UNIT PER TABLET    Take 1 tablet by mouth daily.     CHOLECALCIFEROL (VITAMIN D) 1000 UNITS TABLET    Take 1,000 Units by mouth daily.     CO-ENZYME Q-10 30 MG CAPSULE    Take 30 mg by mouth daily.     CYANOCOBALAMIN (B-12) 2500 MCG TABS    Take 1 tablet by mouth every other day.    DOCUSATE SODIUM (COLACE) 100 MG CAPSULE    Take 100 mg by mouth at bedtime.    FISH OIL-OMEGA-3 FATTY ACIDS 1000 MG CAPSULE    Take 1 g by mouth daily.     GLUCOSE BLOOD TEST STRIP    1 each by Other route as needed. Use as instructed    HOMEOPATHIC PRODUCTS ([employer] LEG CRAMPS PAIN RELIEF PO)    Take by mouth. 3 tablets a day    LEVOTHYROXINE (SYNTHROID, LEVOTHROID) 125 MCG TABLET    Take 1 tablet (125 mcg total) by mouth daily.   POLYETHYLENE GLYCOL (MIRALAX / GLYCOLAX) PACKET    Take 17 g by mouth daily as needed.    PREGABALIN (LYRICA) 75 MG CAPSULE    Take 1 capsule (75 mg total) by mouth at bedtime.   PROBIOTIC PRODUCT (PROBIOTIC FORMULA) CAPS    Take 1 capsule by mouth daily.     SIMVASTATIN (ZOCOR) 40 MG TABLET    Take 40 mg by mouth daily.     VALSARTAN-HYDROCHLOROTHIAZIDE (DIOVAN-HCT) 80-12.5 MG PER TABLET    TAKE 1 TABLET BY MOUTH DAILY  Modified Medications   No medications on file  Discontinued Medications   TRAMADOL (ULTRAM) 50 MG TABLET    Take 1 tablet (50 mg total) by mouth every 6 (six) hours as needed for pain. Maximum dose= 8 tablets per day

## 2011-09-18 ENCOUNTER — Other Ambulatory Visit (INDEPENDENT_AMBULATORY_CARE_PROVIDER_SITE_OTHER): Payer: MEDICARE

## 2011-09-18 DIAGNOSIS — D649 Anemia, unspecified: Secondary | ICD-10-CM

## 2011-09-18 DIAGNOSIS — R7309 Other abnormal glucose: Secondary | ICD-10-CM | POA: Diagnosis not present

## 2011-09-18 DIAGNOSIS — R0609 Other forms of dyspnea: Secondary | ICD-10-CM | POA: Diagnosis not present

## 2011-09-18 DIAGNOSIS — F411 Generalized anxiety disorder: Secondary | ICD-10-CM

## 2011-09-18 DIAGNOSIS — I10 Essential (primary) hypertension: Secondary | ICD-10-CM

## 2011-09-18 DIAGNOSIS — E785 Hyperlipidemia, unspecified: Secondary | ICD-10-CM

## 2011-09-18 DIAGNOSIS — R0600 Dyspnea, unspecified: Secondary | ICD-10-CM

## 2011-09-18 LAB — LIPID PANEL
HDL: 63.4 mg/dL (ref >39.00)
Total CHOL/HDL Ratio: 3

## 2011-09-18 LAB — CBC WITH DIFFERENTIAL/PLATELET
Basophils Absolute: 0.1 10*3/uL (ref 0.0–0.1)
Eosinophils Relative: 7.8 % — ABNORMAL HIGH (ref 0.0–5.0)
HCT: 33.9 % — ABNORMAL LOW (ref 36.0–46.0)
Lymphs Abs: 2.2 10*3/uL (ref 0.7–4.0)
MCHC: 32.7 g/dL (ref 30.0–36.0)
MCV: 94.8 fl (ref 78.0–100.0)
Monocytes Absolute: 0.5 10*3/uL (ref 0.1–1.0)
Platelets: 231 10*3/uL (ref 150.0–400.0)
RDW: 14.2 % (ref 11.5–14.6)

## 2011-09-18 LAB — HEPATIC FUNCTION PANEL
AST: 19 U/L (ref 0–37)
Alkaline Phosphatase: 62 U/L (ref 39–117)
Bilirubin, Direct: 0.1 mg/dL (ref 0.0–0.3)
Total Bilirubin: 0.6 mg/dL (ref 0.3–1.2)

## 2011-09-18 LAB — BASIC METABOLIC PANEL
GFR: 82.81 mL/min (ref >60.00)
Potassium: 4 mEq/L (ref 3.5–5.1)
Sodium: 139 mEq/L (ref 135–145)

## 2011-09-18 LAB — TSH: TSH: 2.41 u[IU]/mL (ref 0.35–5.50)

## 2011-09-22 DIAGNOSIS — H259 Unspecified age-related cataract: Secondary | ICD-10-CM | POA: Diagnosis not present

## 2011-09-22 DIAGNOSIS — H3530 Unspecified macular degeneration: Secondary | ICD-10-CM | POA: Diagnosis not present

## 2011-09-22 DIAGNOSIS — E119 Type 2 diabetes mellitus without complications: Secondary | ICD-10-CM | POA: Diagnosis not present

## 2011-09-22 DIAGNOSIS — H52209 Unspecified astigmatism, unspecified eye: Secondary | ICD-10-CM | POA: Diagnosis not present

## 2011-09-25 ENCOUNTER — Telehealth: Payer: Self-pay | Admitting: Pulmonary Disease

## 2011-09-25 NOTE — Telephone Encounter (Signed)
Leigh did you call the pt? I see the lab results, but is there anything else the pt needs to know? [name], CMA

## 2011-09-25 NOTE — Telephone Encounter (Signed)
Called and spoke with pt about her lab results per SN.  See result note.

## 2011-09-26 ENCOUNTER — Encounter: Payer: Self-pay | Admitting: *Deleted

## 2011-10-21 DIAGNOSIS — H52209 Unspecified astigmatism, unspecified eye: Secondary | ICD-10-CM | POA: Diagnosis not present

## 2011-10-21 DIAGNOSIS — H269 Unspecified cataract: Secondary | ICD-10-CM | POA: Diagnosis not present

## 2011-10-21 DIAGNOSIS — H2510 Age-related nuclear cataract, unspecified eye: Secondary | ICD-10-CM | POA: Diagnosis not present

## 2011-10-21 DIAGNOSIS — H25019 Cortical age-related cataract, unspecified eye: Secondary | ICD-10-CM | POA: Diagnosis not present

## 2011-11-04 DIAGNOSIS — H25019 Cortical age-related cataract, unspecified eye: Secondary | ICD-10-CM | POA: Diagnosis not present

## 2011-11-04 DIAGNOSIS — H52209 Unspecified astigmatism, unspecified eye: Secondary | ICD-10-CM | POA: Diagnosis not present

## 2011-11-04 DIAGNOSIS — H2510 Age-related nuclear cataract, unspecified eye: Secondary | ICD-10-CM | POA: Diagnosis not present

## 2011-11-04 DIAGNOSIS — H269 Unspecified cataract: Secondary | ICD-10-CM | POA: Diagnosis not present

## 2011-11-06 ENCOUNTER — Telehealth: Payer: Self-pay | Admitting: Pulmonary Disease

## 2011-11-06 MED ORDER — SIMVASTATIN 40 MG PO TABS: 40.0000 mg | ORAL_TABLET | Freq: Every day | ORAL | Status: DC

## 2011-11-06 NOTE — Telephone Encounter (Signed)
Spoke with pt requesting rx for  zocor be sent [facility] sent an nothing further was needed

## 2011-12-11 ENCOUNTER — Ambulatory Visit (INDEPENDENT_AMBULATORY_CARE_PROVIDER_SITE_OTHER): Payer: MEDICARE | Admitting: Adult Health

## 2011-12-11 ENCOUNTER — Encounter: Payer: Self-pay | Admitting: Adult Health

## 2011-12-11 ENCOUNTER — Ambulatory Visit (INDEPENDENT_AMBULATORY_CARE_PROVIDER_SITE_OTHER): Payer: MEDICARE | Admitting: *Deleted

## 2011-12-11 ENCOUNTER — Encounter: Payer: Self-pay | Admitting: Internal Medicine

## 2011-12-11 VITALS — BP 138/60 | HR 68 | Temp 98.0°F | Ht 65.0 in | Wt 152.0 lb

## 2011-12-11 DIAGNOSIS — I495 Sick sinus syndrome: Secondary | ICD-10-CM

## 2011-12-11 DIAGNOSIS — Z950 Presence of cardiac pacemaker: Secondary | ICD-10-CM | POA: Diagnosis not present

## 2011-12-11 DIAGNOSIS — H6120 Impacted cerumen, unspecified ear: Secondary | ICD-10-CM | POA: Diagnosis not present

## 2011-12-11 NOTE — Progress Notes (Signed)
Subjective:    Patient ID: Kayla Davis, female    DOB: [birth year], 78 y.o.   MRN: [id number]  HPI 78 y/o WF    Recurrent bronchitic infections;  HBP;  Aortic valve dis;  Cardiac pacemaker;  Ven insuffic;  Hyperlipidemia;  DM;  Hypothyroid;  Divertics/ constip;  DJD/ LBP;  Osteopenia;  Hx TIA;  Periph neuropathy;  Anxiety;  Anemia; and hx Shingles...     12/11/2011 Acute OV  Complains of unable to hear, wants her ears washed out .  Says she cant hear the phone ring.  Wears hearing aids  Has hx of cerumen impaction  Debrox does not work.         Problem List:  HEARING LOSS (ICD-389.9) - she has bilat hearing aides.  Hx of BRONCHITIS, RECURRENT >> Hx Pneumonia >> ~  8/12: she had CAP in [place] treated w/ Avelox & slowly resolved back to baseline... ~  CXR 9/12 w/ sl pectus excavatum, mod thor spondylosis, mild cardiomeg, pacer, mild left base scarring, NAD...  HYPERTENSION (ICD-401.9) - controlled on DIOVAN/Hct 80-12.5 daily...  ~  4/12:  BP=120/58 and tol med well... she denies HA, fatigue, visual changes, CP, palipit, ch in dyspnea, edema, etc... ~  1/13:  BP= 142/66 & she remains largely asymptomatic...   CHEST WALL PAIN, HX OF (ICD-V15.89)  AORTIC VALVE DISORDERS (ICD-424.1)- s/p cardiac work up in 2001... currently denies CP etc... ~  NuclearStressTest 3/01 showed impaired exerc capacity & ST depression but no ischemia or infarction on images and EF=72%... ~  2DEcho 10/06 showed no regional wall motion abn, EF= 70%, mild incr AV thickness w/ reduced excursion, mild AI, sl decr RV function. ~  2DEcho 5/11 showed norm LV size & function, EF=55-60%, mild AI w/ sl thickening of valve, mild MR, mod TR, grI DD...  PACER for Pre-syncope>  Hosp 10/11 for presyncope w/ LBBB & pauses ==> pacer placed & she is now stable, asymptomatic, & followed in the device clinic by DrAllred... ~  10/12: she had f/u DrAllred, doing well, normal pacer function; she was complaining of leg weakness she  though due to statin rx & they held it for a month...  VENOUS INSUFFICIENCY (ICD-459.81) - she has mod VV, VI, & chr leg symptoms... good arterial pulses distally, no ischemic symptoms. ~  11/12:  Venous Dopplers done for leg pain were neg for DVT, no superfic thrombus or incompetence either...  HYPERLIPIDEMIA (ICD-272.4) - on SIMVASTATIN 40mg /d & she has stopped it on several occas due to leg discomfort to see if this would help- it has never lead to improved leg symptoms!... also takes FISH OIL daily. ~  FLP 4/08 on Simvastatin 40mg /d showed TChol 171, TG 88, HDL 58, LDL 96... ~  FLP 6/09 showed TChol 220, TG 98, HDL 58, LDL 143... rec to restart her Simvast40. ~  10/10: not fasting at present & we discussed f/u FLP on ret in 2011 ~  FLP 4/11 showed TChol 176, TG 81, HDL 65, LDL 95 ~  FLP 4/12 showed TChol 189, TG 103, HDL 70, LDL 99  DIABETES MELLITUS, BORDERLINE (ICD-790.29) - on diet alone...  ~  labs 4/08 w/ BS= 106... ~  labs 2/09 w/ BS= 170, HgA1c= 6.7.Marland KitchenMarland Kitchen rec to start Metformin 500mg /d but she declined, prefer diet... ~  labs 6/09 showed BS= 97, HgA1c= 6.6.Marland KitchenMarland Kitchen rec- continue diet Rx. ~  labs 3/10 showed BS= 129, A1c= 6.6 ~  labs 9/10 showed BS= 108 ~  labs 4/11 showed  BS= 96 ~  Labs 4/12 showed BS= 89  HYPOTHYROIDISM (ICD-244.9) - on SYNTHROID 163mcg/d... ~  TSH 4/08 = 1.45... keep same dose. ~  TSH 2/09 = 0.17 on 121mcg/d dose... rec to decr to 163mcg/d... ~  TSH 6/09 = 6.20 on 121mcg/d dose... she wants ret to dose. ~  TSH 3/10 = 0.41 on 149mcg/d dose. ~  TSH 4/11 = 0.24 on 11mcg/d dose... She doesn't want to decr the dose. ~  TSH 4/12 = 0.83 on 141mcg/d dose  DIVERTICULOSIS OF COLON (ICD-562.10) & CONSTIPATION (ICD-564.00) - we discussed a regimen to prevent constipation & she uses MIRALAX Prn... ~  Flex Sig 6/96 by DrSam showed divertics, otherw normal left colon...  DEGENERATIVE JOINT DISEASE (ICD-715.90) - she takes Glucosamine/ Chondroitin... LOW BACK PAIN &  ?Neurogenic claudication in legs> ~  she persisits w/ mult somatic complaints--- she states she has tried off Simvastatin, Lyrica, Fosamax & others but nothing helps and her symptoms persist... XRays showed mild DJD both hips & lower spine; & BMD 5/09 showed min osteopenia w/ TScores -0.0 in Spine & -1.4 in right FemNeck... she has tried Rx w/ Tramadol, Advil, Osteobiflex, heat, etc...  ~  eval by Ortho- DrRamos w/ LBP, multilevel DDD, spondylolisthesis L5-S1 & bilat leg pain> she's tried phys therapy, water exercises, meds (no benefit); epid steroid shots have helped some...  OSTEOPOROSIS (ICD-733.00) - on ALENDRONATE 70mg /wk, calcium, vitamins... ~  BMD here 5/09 showed TScores of 0.00 in Spine, and -1.4 in the right Fem Neck ~  BMD here 9/11 showed TScores of +0.8 in Spine, and -1.7 in the right [facility]... rec to continue same med.  Hx of TRANSIENT ISCHEMIC ATTACK (ICD-435.9) - she remains on ASA and has not had any new cerebral ischemic symptoms...  PERIPHERAL NEUROPATHY (ICD-356.9) - c/o chronic leg discomfort as noted... extensive work up in the past- DrStuckey 2001 w/ normal art dopplers; DrLove in 2001 w/ a neg NCV study;  DrAplington w/ meds and TENS trial (per her request- it helped her brother)... now DrRamos w/ PT, water exercises, and epid shots... tried Lyrica w/o benefit...  ANXIETY (ICD-300.00) - she declines anxiolytic Rx.  SHINGLES (ICD-053.9) - developed shingles Mar09 involving right sacral dermatomes w/ rash and pain in the perineal and buttock areas... treated via ER & several visits w/ NP w/ Valtrex, Pred, Lyrica, Vicodin... lesions resolved, some persist post-herpetic neuralgia...  ANEMIA, MILD (ICD-285.9) ~  labs 9/09 showed Hg= 11.8.[name]. ~  labs 3/10 showed Hg= 11.6, Fe= 64 (sat=20%) ~  labs 4/11 showed = Hg= 11.4 ~  Labs 4/12 showed Hg= 11.9  HEALTH MAINTENANCE:  she had the 2010 Flu shot 9/10... PNEUMOVAX in 2006 age age 35...   Past Surgical History  Procedure  Date  . Pacemaker insertion     MDT  . Appendectomy   . Cholecystectomy   . Tonsillectomy   . Vaginal hysterectomy   . Cataract extraction, bilateral end of June 2013  . Cataract extraction, bilateral beginning of July 2013    Outpatient Encounter Prescriptions as of 12/11/2011  Medication Sig Dispense Refill  . alendronate (FOSAMAX) 70 MG tablet Take 1 tablet (70 mg total) by mouth every 7 (seven) days. Take with a full glass of water on an empty stomach.  12 tablet  3  . Alpha-Lipoic Acid 200 MG CAPS Take 1 capsule by mouth daily.        [name] aspirin 325 MG tablet Take 325 mg by mouth daily.        [name]  Calcium Carbonate-Vitamin D (CALTRATE 600+D) 600-400 MG-UNIT per tablet Take 1 tablet by mouth daily.        . cholecalciferol (VITAMIN D) 1000 UNITS tablet Take 1,000 Units by mouth daily.        [name] co-enzyme Q-10 30 MG capsule Take 30 mg by mouth daily.        . Cyanocobalamin (B-12) 2500 MCG TABS Take 1 tablet by mouth every other day.       . docusate sodium (COLACE) 100 MG capsule Take 100 mg by mouth at bedtime.       . fish oil-omega-3 fatty acids 1000 MG capsule Take 1 g by mouth daily.        [name] glucose blood test strip 1 each by Other route as needed. Use as instructed       . Homeopathic Products ([employer] LEG CRAMPS PAIN RELIEF PO) 3 tablets a day as needed for leg cramps      . levothyroxine (SYNTHROID, LEVOTHROID) 125 MCG tablet Take 1 tablet (125 mcg total) by mouth daily.  90 tablet  3  . polyethylene glycol (MIRALAX / GLYCOLAX) packet Take 17 g by mouth daily as needed.       . pregabalin (LYRICA) 75 MG capsule Take 1 capsule (75 mg total) by mouth at bedtime.  90 capsule  3  . Probiotic Product (PROBIOTIC FORMULA) CAPS Take 1 capsule by mouth daily.        . simvastatin (ZOCOR) 40 MG tablet Take 1 tablet (40 mg total) by mouth daily.  90 tablet  4  . valsartan-hydrochlorothiazide (DIOVAN-HCT) 80-12.5 MG per tablet TAKE 1 TABLET BY MOUTH DAILY  90 tablet  3    Allergies  Allergen  Reactions  . Alprazolam     REACTION: eyes swell up  . Penicillins     REACTION: swells up all over body, pt states she almost died  . Sulfonamide Derivatives     REACTION: itching    Current Medications, Allergies, Past Medical History, Past Surgical History, Family History, and Social History were reviewed in [employer] record.    Review of Systems        Constitutional:   No  weight loss, night sweats,  Fevers, chills,+ fatigue, or  lassitude.  HEENT:   No headaches,  Difficulty swallowing,  Tooth/dental problems, or  Sore throat,                No sneezing, itching, ear ache, nasal congestion, post nasal drip,   C       Objective:   Physical Exam      WD, WN, 78 y/o WF in NAD;  VS- reviewed. GENERAL:  Alert & oriented; pleasant & cooperative... HEENT:  [place]/AT,EAC wax impaction on right and partial on left     Assessment & Plan:

## 2011-12-11 NOTE — Patient Instructions (Addendum)
May use Debrox for ear wax.  Please contact office for sooner follow up if symptoms do not improve or worsen or seek emergency care  follow up Dr. Nadel  As planned and As needed    

## 2011-12-11 NOTE — Assessment & Plan Note (Signed)
Bilateral ear irrigation with wax extraction  Tolerated well  Advised to use debrox As needed

## 2011-12-12 LAB — REMOTE PACEMAKER DEVICE
AL IMPEDENCE PM: 508 Ohm
AL THRESHOLD: 0.5 V
ATRIAL PACING PM: 22
RV LEAD THRESHOLD: 0.5 V

## 2011-12-16 ENCOUNTER — Encounter: Payer: Self-pay | Admitting: *Deleted

## 2011-12-21 ENCOUNTER — Other Ambulatory Visit: Payer: Self-pay | Admitting: Pulmonary Disease

## 2011-12-22 ENCOUNTER — Other Ambulatory Visit: Payer: Self-pay | Admitting: Pulmonary Disease

## 2011-12-30 DIAGNOSIS — Z23 Encounter for immunization: Secondary | ICD-10-CM | POA: Diagnosis not present

## 2012-02-23 DIAGNOSIS — L821 Other seborrheic keratosis: Secondary | ICD-10-CM | POA: Diagnosis not present

## 2012-02-23 DIAGNOSIS — D239 Other benign neoplasm of skin, unspecified: Secondary | ICD-10-CM | POA: Diagnosis not present

## 2012-02-23 DIAGNOSIS — L570 Actinic keratosis: Secondary | ICD-10-CM | POA: Diagnosis not present

## 2012-02-24 ENCOUNTER — Encounter: Payer: Self-pay | Admitting: *Deleted

## 2012-02-25 ENCOUNTER — Other Ambulatory Visit: Payer: Self-pay | Admitting: *Deleted

## 2012-02-25 MED ORDER — PREGABALIN 75 MG PO CAPS: 75.0000 mg | ORAL_CAPSULE | Freq: Every day | ORAL | Status: DC

## 2012-03-01 ENCOUNTER — Ambulatory Visit (INDEPENDENT_AMBULATORY_CARE_PROVIDER_SITE_OTHER): Payer: MEDICARE | Admitting: Internal Medicine

## 2012-03-01 ENCOUNTER — Encounter: Payer: MEDICARE | Admitting: Internal Medicine

## 2012-03-01 ENCOUNTER — Encounter: Payer: Self-pay | Admitting: Internal Medicine

## 2012-03-01 VITALS — BP 124/58 | HR 65 | Ht 65.0 in | Wt 154.8 lb

## 2012-03-01 DIAGNOSIS — I495 Sick sinus syndrome: Secondary | ICD-10-CM

## 2012-03-01 LAB — PACEMAKER DEVICE OBSERVATION
AL IMPEDENCE PM: 480 Ohm
AL THRESHOLD: 0.5 V
RV LEAD AMPLITUDE: 15.67 mv
RV LEAD IMPEDENCE PM: 520 Ohm

## 2012-03-01 NOTE — Patient Instructions (Signed)
Your physician wants you to follow-up in: 12 months with Dr [name] You will receive a reminder letter in the mail two months in advance. If you don't receive a letter, please call our office to schedule the follow-up appointment.   Remote monitoring is used to monitor your Pacemaker of ICD from home. This monitoring reduces the number of office visits required to check your device to one time per year. It allows us to keep an eye on the functioning of your device to ensure it is working properly. You are scheduled for a device check from home on 06/08/11. You may send your transmission at any time that day. If you have a wireless device, the transmission will be sent automatically. After your physician reviews your transmission, you will receive a postcard with your next transmission date.    

## 2012-03-01 NOTE — Progress Notes (Signed)
PCP: Michele Mcalpine, MD  Kayla Davis is a 78 y.o. female who presents today for routine electrophysiology followup.  Since last being seen in our clinic, the patient reports doing very well.  Today, she denies symptoms of palpitations, chest pain, shortness of breath,  lower extremity edema, dizziness, presyncope, or syncope.  The patient is otherwise without complaint today.   Past Medical History  Diagnosis Date  . Hypertension   . Hyperlipidemia   . Aortic valve disorders   . Unspecified hereditary and idiopathic peripheral neuropathy   . Other abnormal glucose   . Unspecified hearing loss   . Unspecified chronic bronchitis   . Unspecified hypothyroidism   . Unspecified gastritis and gastroduodenitis without mention of hemorrhage   . Diverticulosis of colon (without mention of hemorrhage)   . Irritable bowel syndrome   . Osteoarthrosis, unspecified whether generalized or localized, unspecified site   . Backache, unspecified   . Pain in limb   . Osteoporosis, unspecified   . Herpes zoster with other nervous system complications   . Anxiety state, unspecified   . Anemia, unspecified   . Bradycardia     s/p PPM   Past Surgical History  Procedure Date  . Pacemaker insertion     MDT  . Appendectomy   . Cholecystectomy   . Tonsillectomy   . Vaginal hysterectomy   . Cataract extraction, bilateral end of June 2013  . Cataract extraction, bilateral beginning of July 2013    Current Outpatient Prescriptions  Medication Sig Dispense Refill  . alendronate (FOSAMAX) 70 MG tablet Take 1 tablet (70 mg total) by mouth every 7 (seven) days. Take with a full glass of water on an empty stomach.  12 tablet  3  . Alpha-Lipoic Acid 200 MG CAPS Take 1 capsule by mouth daily.        [name] aspirin 325 MG tablet Take 325 mg by mouth daily.        . Calcium Carbonate-Vitamin D (CALTRATE 600+D) 600-400 MG-UNIT per tablet Take 1 tablet by mouth daily.        . cholecalciferol (VITAMIN D) 1000  UNITS tablet Take 1,000 Units by mouth daily.        [name] co-enzyme Q-10 30 MG capsule Take 30 mg by mouth daily.        . Cyanocobalamin (B-12) 2500 MCG TABS Take 1 tablet by mouth every other day.       . docusate sodium (COLACE) 100 MG capsule Take 100 mg by mouth at bedtime.       . fish oil-omega-3 fatty acids 1000 MG capsule Take 1 g by mouth daily.        [name] glucose blood test strip 1 each by Other route as needed. Use as instructed       . Homeopathic Products ([employer] LEG CRAMPS PAIN RELIEF PO) 3 tablets a day as needed for leg cramps      . polyethylene glycol (MIRALAX / GLYCOLAX) packet Take 17 g by mouth daily as needed.       . pregabalin (LYRICA) 75 MG capsule Take 1 capsule (75 mg total) by mouth at bedtime.  90 capsule  3  . Probiotic Product (PROBIOTIC FORMULA) CAPS Take 1 capsule by mouth daily.        . simvastatin (ZOCOR) 40 MG tablet Take 1 tablet (40 mg total) by mouth daily.  90 tablet  4  . SYNTHROID 125 MCG tablet TAKE 1 TABLET DAILY  90 tablet  3  . valsartan-hydrochlorothiazide (DIOVAN-HCT) 80-12.5 MG per tablet TAKE 1 TABLET BY MOUTH DAILY  90 tablet  3  . [DISCONTINUED] valsartan-hydrochlorothiazide (DIOVAN-HCT) 80-12.5 MG per tablet TAKE 1 TABLET DAILY  90 tablet  3    Physical Exam: Filed Vitals:   03/01/12 1212  BP: 124/58  Pulse: 65  Height: 5\' 5"  (1.651 m)  Weight: 154 lb 12.8 oz (70.217 kg)    GEN- The patient is well appearing, alert and oriented x 3 today.   Head- normocephalic, atraumatic Eyes-  Sclera clear, conjunctiva pink Ears- hearing intact Oropharynx- clear Lungs- Clear to ausculation bilaterally, normal work of breathing Chest- pacemaker pocket is well healed Heart- Regular rate and rhythm, no murmurs, rubs or gallops, PMI not laterally displaced GI- soft, NT, ND, + BS Extremities- no clubbing, cyanosis, or edema  Pacemaker interrogation- reviewed in detail today,  See PACEART report  Assessment and Plan:  1. Bradycardia Normal pacemaker  function See Pace Art report No changes today

## 2012-03-09 ENCOUNTER — Telehealth: Payer: Self-pay | Admitting: Pulmonary Disease

## 2012-03-09 MED ORDER — AZITHROMYCIN 250 MG PO TABS: ORAL_TABLET | ORAL | Status: DC

## 2012-03-09 NOTE — Telephone Encounter (Signed)
I spoke with pt and she c/o cough w/ clear thick phlem, chest congestion, slight fever (not sure what it is but feels hot), left ear pain, body aches x 2 days. No sore throat, chills, sweats, nausea, vomiting. She is taking mucinex, Advil, Claritin daily. She is requesting ABX so she does not get PNA like she did last year. Please advise Sn thanks  Last OV 09/16/11 w/ SN Pending 03/16/12  Allergies  Allergen Reactions  . Alprazolam     REACTION: eyes swell up  . Penicillins     REACTION: swells up all over body, pt states she almost died  . Sulfonamide Derivatives     REACTION: itching

## 2012-03-09 NOTE — Telephone Encounter (Signed)
Per SN---ok to call in zpak #1  Take as directed with 1 refill.  Called and spoke with pt and she is aware that the rx has been sent in to the pharmacy.

## 2012-03-10 ENCOUNTER — Telehealth: Payer: Self-pay | Admitting: Pulmonary Disease

## 2012-03-10 NOTE — Telephone Encounter (Signed)
Called and spoke with pt and she wanted to make sure that she could take the zyrtec along with her zpak.  Pt is aware that this will be ok and the mucinex as well.  Nothing further is needed.

## 2012-03-15 ENCOUNTER — Encounter: Payer: Self-pay | Admitting: *Deleted

## 2012-03-16 ENCOUNTER — Ambulatory Visit (INDEPENDENT_AMBULATORY_CARE_PROVIDER_SITE_OTHER): Payer: MEDICARE | Admitting: Pulmonary Disease

## 2012-03-16 ENCOUNTER — Encounter: Payer: Self-pay | Admitting: Pulmonary Disease

## 2012-03-16 VITALS — BP 132/60 | HR 64 | Temp 97.0°F | Ht 65.0 in | Wt 156.4 lb

## 2012-03-16 DIAGNOSIS — R7309 Other abnormal glucose: Secondary | ICD-10-CM

## 2012-03-16 DIAGNOSIS — Z950 Presence of cardiac pacemaker: Secondary | ICD-10-CM

## 2012-03-16 DIAGNOSIS — E785 Hyperlipidemia, unspecified: Secondary | ICD-10-CM

## 2012-03-16 DIAGNOSIS — K5730 Diverticulosis of large intestine without perforation or abscess without bleeding: Secondary | ICD-10-CM

## 2012-03-16 DIAGNOSIS — M1990 Unspecified osteoarthritis, unspecified site: Secondary | ICD-10-CM

## 2012-03-16 DIAGNOSIS — G609 Hereditary and idiopathic neuropathy, unspecified: Secondary | ICD-10-CM

## 2012-03-16 DIAGNOSIS — I872 Venous insufficiency (chronic) (peripheral): Secondary | ICD-10-CM

## 2012-03-16 DIAGNOSIS — E039 Hypothyroidism, unspecified: Secondary | ICD-10-CM

## 2012-03-16 DIAGNOSIS — M549 Dorsalgia, unspecified: Secondary | ICD-10-CM

## 2012-03-16 DIAGNOSIS — I359 Nonrheumatic aortic valve disorder, unspecified: Secondary | ICD-10-CM

## 2012-03-16 DIAGNOSIS — J42 Unspecified chronic bronchitis: Secondary | ICD-10-CM | POA: Diagnosis not present

## 2012-03-16 DIAGNOSIS — M79609 Pain in unspecified limb: Secondary | ICD-10-CM

## 2012-03-16 DIAGNOSIS — D649 Anemia, unspecified: Secondary | ICD-10-CM

## 2012-03-16 DIAGNOSIS — F411 Generalized anxiety disorder: Secondary | ICD-10-CM

## 2012-03-16 DIAGNOSIS — I495 Sick sinus syndrome: Secondary | ICD-10-CM | POA: Diagnosis not present

## 2012-03-16 DIAGNOSIS — I10 Essential (primary) hypertension: Secondary | ICD-10-CM

## 2012-03-16 DIAGNOSIS — K589 Irritable bowel syndrome without diarrhea: Secondary | ICD-10-CM

## 2012-03-16 NOTE — Progress Notes (Signed)
Subjective:    Patient ID: Kayla Davis, female    DOB: Aug 02, 1922, 78 y.o.   MRN: [id number]  HPI 78 y/o WF here for a follow up visit... she has mult med problems including:  Recurrent bronchitic infections;  HBP;  Aortic valve dis;  Cardiac pacemaker;  Ven insuffic;  Hyperlipidemia;  DM;  Hypothyroid;  Divertics/ constip;  DJD/ LBP;  Osteopenia;  Hx TIA;  Periph neuropathy;  Anxiety;  Anemia; and hx Shingles...  ~  July 30, 2010:  [name] & she has had an eventful yr w/ [facility] 10/11 for presyncope w/ LBBB & pauses ==> pacer placed & she is now followed in the device clinic & saw DrAllred 2/12 doing satis;  She continues to complain about leg cramps- now mostly at night & we discussed Tonic water, Mustard, etc;  Her son brought her some "quinine pills" from ?[employer] (homeopathic under the tongue tab) & she will try this if she awakes w/ the discomfort;  She has f/u w/ DrRamos for epid shots (2/3 have helped)...  Otherw stable> BP controlled, no recent CP, due for f/u FLP & DM labs, etc (see below)>  ~  December 25, 2010:  231mo ROV & recheck from recent eval by TP> seen 8/12 w/ RLL pneumonia on CXR, treated w/ Avelox & improved, some XRay lag time but CXR today back to baseline...    Hx HBP controlled on DiovanHCT w/ BP= 146/70 today & she denies HA, visual symptoms, CP, palpit, syncope, ch in dyspnea, edema;  she has pacer, LBBB, AoV dis- followed by DrAllred for cards...  ~  May 14, 2011:  31mo ROV & she has been under the care of Podiatrist DrPetrinitz for ?toe infection treated w/ Cipro & Clinda==> improved;  She had [facility] screen & brings report of:  1) mild bilat carotid stenoses;  2) Neg abd ao Sonar w/o aneurysm;  3) ABIs are wnl;  4) BMI is wnl at 24 (149#, 60.5" tall)...    <Hx Bronchitis, Pneumonia> resolved, no resid infection, CXR 9/12 reviewed...    <HBP> controlled on DiovanHCT 80-12.5; BP= 142/66 & she denies CP, palpit, dizzy, SOB, edema, etc...    <Cardiac pacer>  followed by DrAllred for presyncope, LBBB, and pauses; seen 10/12 & pacer functioning well, continue same meds...    <Ven Insuffic> she had neg ven dopplers 11/12; has mod VI, chr leg symptoms==> stable...    <Chol> on Simva40 & FishOil; she has stopped it on numerous occas to see if leg symptoms improved==> no better off this med; FLP 4/12 looked good on Simva40...    <Hypothyroid> stable on Synthroid 187mcg/d...    <DJD, LBP> she has mult somatic complaints; ?neurogenic claudication==> she has tried PT, water exercise, meds, ESI, etc...  ~  September 16, 2011:  31mo ROV & [name] is doing very well> she has a relative who is 76 y/o still sharp/ ambulatory/ & makes her own clothes!  Her CC is LBP and neuropathy symptoms> she's received shots from DrRamos w/ help & restarted LYRICA 75mg  Qhs per TP which really helps, she says;  Had UTI 4/13 treated & everything at baseline now...      We reviewed prob list, meds, xrays and labs> see below>> LABS 6/13:  FLP- at goals on Simva40;  Chems- ok w/ BS=86, A1c=6.5  CBC- ok x Hg=11.1;  TSH=2.41;  BNP=111...  ~  March 16, 2012:  63mo ROV & Kayla Davis is about the same, notes that it is hard  to walk due to her leg discomfort, back hurts, knees hurt, etc; she refuses PT & we will not force the issue given her 89 yrs... We reviewed the following medical problems during today's office visit >>      Hearing Loss> wears bilat hearing aides...    Hx Bronchitis> she has ZPak to keep on hand for prn use...    HBP> on DiovaHCT80-12.5; BP= 132/60 & she denies anginaCP, palpit, dizzy, ch in SOB, edema, etc...     Cards- CWP, AI, pacer> on ASA325; follwed by DrAllred & seen 11/13- stable, doing satis, pacer ok...    VenInsuffic> she knows to elim sodium, elev legs, wear support hose...    CHOL> on Simva40, FishOil, CoQ10; yearly FLP done 6/13 & all parameters at goals...    DM> on diet alone; yearly labs 6/13 were wnl- BS=86, A1c=6.5    Hypothy> on Synthroid125; yearly labs 6/13  confirmed euthyroid on this dose...    GI- Divertics, constip> on Miralax, Colace, Probiotics; she denies abd pain, n/v/d, blood seen...    DJD, LBP, Osteopenia> on Fosamax70/wk, calcium, MVI, VitD1000; last BMD 9/11 showed TScore -1.7 in right [facility]    Hx TIA, Neuropathy> on ASA325, [id number], homeopathic leg cramp med; she denies cerebral ischemic symptoms...   Anxiety> she declines anxiolytic Rx...    Anemia> Hg follow up = 11.7 w/ normal serum Fe etc...  We reviewed prob list, meds, xrays and labs> see below for updates >> she had the 2013 Flu vaccine 9/13... LABS 12/13:  Chems- wnl;  CBC- mild anemia w/ Hg=11.7, MCV=94, Fe=83 (24%sat)...         Problem List:  HEARING LOSS (ICD-389.9) - she has bilat hearing aides & [name] has washed out her EACs...  Hx of BRONCHITIS, RECURRENT >> Hx Pneumonia >> ~  8/12: she had CAP in RLL treated w/ Avelox & slowly resolved back to baseline... ~  CXR 9/12 w/ sl pectus excavatum, mod thor spondylosis, mild cardiomeg, pacer, mild left base scarring, NAD...  HYPERTENSION (ICD-401.9) - controlled on DIOVAN/Hct 80-12.5 daily...  ~  4/12:  BP=120/58 and tol med well... she denies HA, fatigue, visual changes, CP, palipit, ch in dyspnea, edema, etc... ~  1/13:  BP= 142/66 & she remains largely asymptomatic...  ~  6/13:  BP= 126/74 & feeling well w/o CP, palpit, dizzy, ch in SOB, edema; Creat=0.7, BNP=111.  CHEST WALL PAIN, HX OF (ICD-V15.89) - on ASA 325mg /d... AORTIC VALVE DISORDERS (ICD-424.1)- s/p cardiac work up in 2001... currently denies CP etc... ~  NuclearStressTest 3/01 showed impaired exerc capacity & ST depression but no ischemia or infarction on images and EF=72%... ~  2DEcho 10/06 showed no regional wall motion abn, EF= 70%, mild incr AV thickness w/ reduced excursion, mild AI, sl decr RV function. ~  2DEcho 5/11 showed norm LV size & function, EF=55-60%, mild AI w/ sl thickening of valve, mild MR, mod TR, grI DD...  PACER for  Pre-syncope>  Hosp 10/11 for presyncope w/ LBBB & pauses ==> pacer placed & she is now stable, asymptomatic, & followed in the device clinic by DrAllred... ~  10/12: she had f/u DrAllred, doing well, normal pacer function; she was complaining of leg weakness she though due to statin rx & they held it for a month...  VENOUS INSUFFICIENCY (ICD-459.81) - she has mod VV, VI, & chr leg symptoms... good arterial pulses distally, no ischemic symptoms. ~  11/12:  Venous Dopplers done for leg pain were neg for DVT,  no superfic thrombus or incompetence either...  HYPERLIPIDEMIA (ICD-272.4) - on SIMVASTATIN 40mg /d & she has stopped it on several occas due to leg discomfort to see if this would help- it has never lead to improved leg symptoms!... also takes FISH OIL daily. ~  FLP 4/08 on Simvastatin 40mg /d showed TChol 171, TG 88, HDL 58, LDL 96... ~  FLP 6/09 showed TChol 220, TG 98, HDL 58, LDL 143... rec to restart her Simvast40. ~  10/10: not fasting at present & we discussed f/u FLP on ret in 2011 ~  FLP 4/11 showed TChol 176, TG 81, HDL 65, LDL 95 ~  FLP 4/12 showed TChol 189, TG 103, HDL 70, LDL 99 ~  FLP 6/13 on Simva40 showed TChol 161, TG 123, HDL 63, LDL 73  DIABETES MELLITUS, BORDERLINE (ICD-790.29) - on diet alone...  ~  labs 4/08 w/ BS= 106... ~  labs 2/09 w/ BS= 170, HgA1c= 6.7.Marland KitchenMarland Kitchen rec to start Metformin 500mg /d but she declined, prefer diet... ~  labs 6/09 showed BS= 97, HgA1c= 6.6.Marland KitchenMarland Kitchen rec- continue diet Rx. ~  labs 3/10 showed BS= 129, A1c= 6.6 ~  labs 9/10 showed BS= 108 ~  labs 4/11 showed BS= 96 ~  Labs 4/12 showed BS= 89 ~  Labs 6/13 on diet alone showed BS= 86, A1c= 6.5  HYPOTHYROIDISM (ICD-244.9) - on SYNTHROID 176mcg/d... ~  TSH 4/08 = 1.45... keep same dose. ~  TSH 2/09 = 0.17 on 1100mcg/d dose... rec to decr to 159mcg/d... ~  TSH 6/09 = 6.20 on 143mcg/d dose... she wants ret to dose. ~  TSH 3/10 = 0.41 on 138mcg/d dose. ~  TSH 4/11 = 0.24 on 120mcg/d dose... She doesn't  want to decr the dose. ~  TSH 4/12 = 0.83 on 170mcg/d dose ~  TSH 6/13 = 2.41 on 142mcg/d  DIVERTICULOSIS OF COLON (ICD-562.10) & CONSTIPATION (ICD-564.00) - we discussed a regimen to prevent constipation & she uses MIRALAX Prn... ~  Flex Sig 6/96 by DrSam showed divertics, otherw normal left colon...  DEGENERATIVE JOINT DISEASE (ICD-715.90) - she takes Glucosamine/ Chondroitin... LOW BACK PAIN & ?Neurogenic claudication in legs> ~  she persisits w/ mult somatic complaints--- she states she has tried off Simvastatin, Lyrica, Fosamax & others but nothing helps and her symptoms persist... XRays showed mild DJD both hips & lower spine; & BMD 5/09 showed min osteopenia w/ TScores -0.0 in Spine & -1.4 in right FemNeck... she has tried Rx w/ Tramadol, Advil, Osteobiflex, heat, etc...  ~  eval by Ortho- DrRamos w/ LBP, multilevel DDD, spondylolisthesis L5-S1 & bilat leg pain> she's tried phys therapy, water exercises, meds (no benefit); epid steroid shots have helped some... ~  6/13:  Pt back on LYRICA 75mg  Qhs at her request for neuropathy & improved, she says...  OSTEOPOROSIS (ICD-733.00) - on ALENDRONATE 70mg /wk, calcium, vitamins... ~  BMD here 5/09 showed TScores of 0.00 in Spine, and -1.4 in the right Fem Neck ~  BMD here 9/11 showed TScores of +0.8 in Spine, and -1.7 in the right [facility]... rec to continue same med.  Hx of TRANSIENT ISCHEMIC ATTACK (ICD-435.9) - she remains on ASA and has not had any new cerebral ischemic symptoms...  PERIPHERAL NEUROPATHY (ICD-356.9) - c/o chronic leg discomfort as noted... extensive work up in the past- DrStuckey 2001 w/ normal art dopplers; DrLove in 2001 w/ a neg NCV study;  DrAplington w/ meds and TENS trial (per her request- it helped her brother)... now DrRamos w/ PT, water exercises, and  epid shots... tried Lyrica and now she thinks it's helping...  ANXIETY (ICD-300.00) - she declines anxiolytic Rx.  SHINGLES (ICD-053.9) - developed shingles Mar09  involving right sacral dermatomes w/ rash and pain in the perineal and buttock areas... treated via ER & several visits w/ NP w/ Valtrex, Pred, Lyrica, Vicodin... lesions resolved, some persist post-herpetic neuralgia...  ANEMIA, MILD (ICD-285.9) ~  labs 9/09 showed Hg= 11.8.[name]. ~  labs 3/10 showed Hg= 11.6, Fe= 64 (sat=20%) ~  labs 4/11 showed = Hg= 11.4 ~  Labs 4/12 showed Hg= 11.9 ~  Labs 6/13 showed Hg= 11.1, MCV=95  HEALTH MAINTENANCE:  she had the 2010 Flu shot 9/10... PNEUMOVAX in 2006 age age 22...   Past Surgical History  Procedure Date  . Pacemaker insertion     MDT  . Appendectomy   . Cholecystectomy   . Tonsillectomy   . Vaginal hysterectomy   . Cataract extraction, bilateral end of June 2013  . Cataract extraction, bilateral beginning of July 2013    Outpatient Encounter Prescriptions as of 03/16/2012  Medication Sig Dispense Refill  . alendronate (FOSAMAX) 70 MG tablet Take 1 tablet (70 mg total) by mouth every 7 (seven) days. Take with a full glass of water on an empty stomach.  12 tablet  3  . Alpha-Lipoic Acid 200 MG CAPS Take 1 capsule by mouth daily.        [name] aspirin 325 MG tablet Take 325 mg by mouth daily.        [name] azithromycin (ZITHROMAX) 250 MG tablet Take as directed  6 each  0  . Calcium Carbonate-Vitamin D (CALTRATE 600+D) 600-400 MG-UNIT per tablet Take 1 tablet by mouth daily.        . cholecalciferol (VITAMIN D) 1000 UNITS tablet Take 1,000 Units by mouth daily.        [name] co-enzyme Q-10 30 MG capsule Take 30 mg by mouth daily.        . Cyanocobalamin (B-12) 2500 MCG TABS Take 1 tablet by mouth every other day.       . docusate sodium (COLACE) 100 MG capsule Take 100 mg by mouth at bedtime.       . fish oil-omega-3 fatty acids 1000 MG capsule Take 1 g by mouth daily.        [name] glucose blood test strip 1 each by Other route as needed. Use as instructed       . Homeopathic Products ([employer] LEG CRAMPS PAIN RELIEF PO) 3 tablets a day as needed for leg cramps       . polyethylene glycol (MIRALAX / GLYCOLAX) packet Take 17 g by mouth daily as needed.       . pregabalin (LYRICA) 75 MG capsule Take 1 capsule (75 mg total) by mouth at bedtime.  90 capsule  3  . Probiotic Product (PROBIOTIC FORMULA) CAPS Take 1 capsule by mouth daily.        . simvastatin (ZOCOR) 40 MG tablet Take 1 tablet (40 mg total) by mouth daily.  90 tablet  4  . SYNTHROID 125 MCG tablet TAKE 1 TABLET DAILY  90 tablet  3  . valsartan-hydrochlorothiazide (DIOVAN-HCT) 80-12.5 MG per tablet TAKE 1 TABLET BY MOUTH DAILY  90 tablet  3    Allergies  Allergen Reactions  . Alprazolam     REACTION: eyes swell up  . Bactrim (Sulfamethoxazole W-Trimethoprim)     Unable to remember the side effects  . Ceclor (Cefaclor)     Unsure  of the side effects  . Cephalexin     Unsure of the side effects  . Clindamycin/Lincomycin     Upset stomach  . Hydrocodone     Unsure of reaction to med  . Noroxin (Norfloxacin)     Unsure of side effects  . Penicillins     REACTION: swells up all over body, pt states she almost died  . Sulfonamide Derivatives     REACTION: itching  . Tetracyclines & Related     Unsure of the reaction to this medication    Current Medications, Allergies, Past Medical History, Past Surgical History, Family History, and Social History were reviewed in [employer] record.    Review of Systems         See HPI - all other systems neg except as noted... The patient complains of decreased hearing, dyspnea on exertion, and difficulty walking.  The patient denies anorexia, fever, weight loss, weight gain, vision loss, hoarseness, chest pain, syncope, peripheral edema, prolonged cough, headaches, hemoptysis, abdominal pain, melena, hematochezia, severe indigestion/heartburn, hematuria, incontinence, muscle weakness, suspicious skin lesions, transient blindness, depression, unusual weight change, abnormal bleeding, enlarged lymph nodes, and angioedema.     Objective:   Physical Exam      WD, WN, 78 y/o WF in NAD;  VS- reviewed. GENERAL:  Alert & oriented; pleasant & cooperative... HEENT:  [place]/AT, EOM-full, PERRLA, EACs-clear (she has bilat hearing aides), TMs-wnl, NOSE-clear, THROAT-clear & wnl. NECK:  Supple w/ fair ROM; no JVD; normal carotid impulses w/o bruits; no thyromegaly or nodules palpated; no lymphadenopathy. CHEST:  Clear to P & A; without wheezes/ rales/ or rhonchi heard; pacer in right upper shoulder area. HEART:  Regular gr 2/6 SEM S4 without rubs... ABDOMEN:  Soft & nontender; normal bowel sounds; no organomegaly or masses detected. EXT: without deformities, mild arthritic changes; +varicose veins/ +venous insuffic/ no edema. NEURO:  CN's intact; no focal neuro deficits x decr sensation in LE's c/w periph neuropathy.  DERM:  No lesions noted; no rash etc...  RADIOLOGY DATA:  Reviewed in the EPIC EMR & discussed w/ the patient...  LABORATORY DATA:  Reviewed in the EPIC EMR & discussed w/ the patient...   Assessment & Plan:    Hx of PNEUMONIA>  RLL pneumonia resolved now, underlying COPD, hx recurrent bronchitis...  HBP>  Controlled on Diovan/HCT;  Continue same...  CARDIAC>  Hx CWP, AoV dis, Pacer;  Stable now & doing well w/o symptoms;  She will continue f/u in Pacer clinic...  LIPIDS>  On Simva40 + CoQ10;  She will ret for f/u FLP & we will notify her of the results...  DM>  On diet alone;  Labs have beed good & she will ret for fasting blood work later this week...  HYPOTHYROID>  Stable on the Synth125 but TSH was sl over suppressed last yr;  We will recheck again & make recommendations when the labs return...  ORTHO>  She has DJD, LBP, leg pain> she continues f/u by DrRamos for shots prn, doesn't want pain meds, we reviewed her predicament...  Noct leg pain>  This seems like an additional prob & rec to try tonic water, mustard, & the homeopathic quinine prep that her son found for her to put under her  tongue...  Other medical problems as noted...   Patient's Medications  New Prescriptions   No medications on file  Previous Medications   ALPHA-LIPOIC ACID 200 MG CAPS    Take 1 capsule by mouth daily.  ASPIRIN 325 MG TABLET    Take 325 mg by mouth daily.     AZITHROMYCIN (ZITHROMAX) 250 MG TABLET    Take as directed   CALCIUM CARBONATE-VITAMIN D (CALTRATE 600+D) 600-400 MG-UNIT PER TABLET    Take 1 tablet by mouth daily.     CHOLECALCIFEROL (VITAMIN D) 1000 UNITS TABLET    Take 1,000 Units by mouth daily.     CO-ENZYME Q-10 30 MG CAPSULE    Take 30 mg by mouth daily.     CYANOCOBALAMIN (B-12) 2500 MCG TABS    Take 1 tablet by mouth every other day.    DOCUSATE SODIUM (COLACE) 100 MG CAPSULE    Take 100 mg by mouth at bedtime.    FISH OIL-OMEGA-3 FATTY ACIDS 1000 MG CAPSULE    Take 1 g by mouth daily.     GLUCOSE BLOOD TEST STRIP    1 each by Other route as needed. Use as instructed    HOMEOPATHIC PRODUCTS ([employer] LEG CRAMPS PAIN RELIEF PO)    3 tablets a day as needed for leg cramps   POLYETHYLENE GLYCOL (MIRALAX / GLYCOLAX) PACKET    Take 17 g by mouth daily as needed.    PREGABALIN (LYRICA) 75 MG CAPSULE    Take 1 capsule (75 mg total) by mouth at bedtime.   PROBIOTIC PRODUCT (PROBIOTIC FORMULA) CAPS    Take 1 capsule by mouth daily.     SIMVASTATIN (ZOCOR) 40 MG TABLET    Take 1 tablet (40 mg total) by mouth daily.   SYNTHROID 125 MCG TABLET    TAKE 1 TABLET DAILY   VALSARTAN-HYDROCHLOROTHIAZIDE (DIOVAN-HCT) 80-12.5 MG PER TABLET    TAKE 1 TABLET BY MOUTH DAILY  Modified Medications   Modified Medication Previous Medication   ALENDRONATE (FOSAMAX) 70 MG TABLET alendronate (FOSAMAX) 70 MG tablet      TAKE 1 TABLET EVERY 7 DAYS.TAKE WITH A FULL GLASS OF  WATER ON AN EMPTY STOMACH    Take 1 tablet (70 mg total) by mouth every 7 (seven) days. Take with a full glass of water on an empty stomach.  Discontinued Medications   No medications on file

## 2012-03-16 NOTE — Patient Instructions (Addendum)
Today we updated your med list in our EPIC system...    Continue your current medications the same...  Today we did your follow up blood work...     We will call you w/ the report...  Call for any questions...  Let's plan a follow up visit in 6 months.Marland KitchenMarland Kitchen

## 2012-03-17 ENCOUNTER — Other Ambulatory Visit (INDEPENDENT_AMBULATORY_CARE_PROVIDER_SITE_OTHER): Payer: MEDICARE

## 2012-03-17 DIAGNOSIS — D649 Anemia, unspecified: Secondary | ICD-10-CM | POA: Diagnosis not present

## 2012-03-17 DIAGNOSIS — I10 Essential (primary) hypertension: Secondary | ICD-10-CM

## 2012-03-17 LAB — CBC WITH DIFFERENTIAL/PLATELET
Basophils Absolute: 0.1 10*3/uL (ref 0.0–0.1)
Eosinophils Absolute: 0.3 10*3/uL (ref 0.0–0.7)
Hemoglobin: 11.7 g/dL — ABNORMAL LOW (ref 12.0–15.0)
Lymphocytes Relative: 35.1 % (ref 12.0–46.0)
MCHC: 33.4 g/dL (ref 30.0–36.0)
Monocytes Absolute: 0.5 10*3/uL (ref 0.1–1.0)
Neutro Abs: 2.7 10*3/uL (ref 1.4–7.7)
Neutrophils Relative %: 49.3 % (ref 43.0–77.0)
RDW: 13.9 % (ref 11.5–14.6)

## 2012-03-17 LAB — BASIC METABOLIC PANEL
CO2: 29 mEq/L (ref 19–32)
Calcium: 9.6 mg/dL (ref 8.4–10.5)
Creatinine, Ser: 0.8 mg/dL (ref 0.4–1.2)
Glucose, Bld: 105 mg/dL — ABNORMAL HIGH (ref 70–99)

## 2012-03-17 LAB — IBC PANEL: Saturation Ratios: 23.9 % (ref 20.0–50.0)

## 2012-03-19 ENCOUNTER — Encounter: Payer: Self-pay | Admitting: Adult Health

## 2012-03-19 ENCOUNTER — Ambulatory Visit (INDEPENDENT_AMBULATORY_CARE_PROVIDER_SITE_OTHER): Payer: MEDICARE | Admitting: Adult Health

## 2012-03-19 VITALS — HR 68 | Temp 97.3°F

## 2012-03-19 DIAGNOSIS — H6120 Impacted cerumen, unspecified ear: Secondary | ICD-10-CM

## 2012-03-19 NOTE — Patient Instructions (Addendum)
May use Debrox for ear wax.  Please contact office for sooner follow up if symptoms do not improve or worsen or seek emergency care  follow up Dr. Kriste Basque  As planned and As needed

## 2012-03-19 NOTE — Progress Notes (Signed)
Subjective:    Patient ID: Kayla Davis, female    DOB: [birth year], 78 y.o.   MRN: [id number]  HPI 78 y/o WF    Recurrent bronchitic infections;  HBP;  Aortic valve dis;  Cardiac pacemaker;  Ven insuffic;  Hyperlipidemia;  DM;  Hypothyroid;  Divertics/ constip;  DJD/ LBP;  Osteopenia;  Hx TIA;  Periph neuropathy;  Anxiety;  Anemia; and hx Shingles...     03/19/2012 Acute OV  Complains of unable to hear, wants her right ear  washed out .  Says she cant hear the phone ring. Wants to hear her grandkids at Christmas .  Wears hearing aids  Has hx of cerumen impaction  Debrox does not work.         Problem List:  HEARING LOSS (ICD-389.9) - she has bilat hearing aides.  Hx of BRONCHITIS, RECURRENT >> Hx Pneumonia >> ~  8/12: she had CAP in [place] treated w/ Avelox & slowly resolved back to baseline... ~  CXR 9/12 w/ sl pectus excavatum, mod thor spondylosis, mild cardiomeg, pacer, mild left base scarring, NAD...  HYPERTENSION (ICD-401.9) - controlled on DIOVAN/Hct 80-12.5 daily...  ~  4/12:  BP=120/58 and tol med well... she denies HA, fatigue, visual changes, CP, palipit, ch in dyspnea, edema, etc... ~  1/13:  BP= 142/66 & she remains largely asymptomatic...   CHEST WALL PAIN, HX OF (ICD-V15.89)  AORTIC VALVE DISORDERS (ICD-424.1)- s/p cardiac work up in 2001... currently denies CP etc... ~  NuclearStressTest 3/01 showed impaired exerc capacity & ST depression but no ischemia or infarction on images and EF=72%... ~  2DEcho 10/06 showed no regional wall motion abn, EF= 70%, mild incr AV thickness w/ reduced excursion, mild AI, sl decr RV function. ~  2DEcho 5/11 showed norm LV size & function, EF=55-60%, mild AI w/ sl thickening of valve, mild MR, mod TR, grI DD...  PACER for Pre-syncope>  Hosp 10/11 for presyncope w/ LBBB & pauses ==> pacer placed & she is now stable, asymptomatic, & followed in the device clinic by DrAllred... ~  10/12: she had f/u DrAllred, doing well, normal pacer  function; she was complaining of leg weakness she though due to statin rx & they held it for a month...  VENOUS INSUFFICIENCY (ICD-459.81) - she has mod VV, VI, & chr leg symptoms... good arterial pulses distally, no ischemic symptoms. ~  11/12:  Venous Dopplers done for leg pain were neg for DVT, no superfic thrombus or incompetence either...  HYPERLIPIDEMIA (ICD-272.4) - on SIMVASTATIN 40mg /d & she has stopped it on several occas due to leg discomfort to see if this would help- it has never lead to improved leg symptoms!... also takes FISH OIL daily. ~  FLP 4/08 on Simvastatin 40mg /d showed TChol 171, TG 88, HDL 58, LDL 96... ~  FLP 6/09 showed TChol 220, TG 98, HDL 58, LDL 143... rec to restart her Simvast40. ~  10/10: not fasting at present & we discussed f/u FLP on ret in 2011 ~  FLP 4/11 showed TChol 176, TG 81, HDL 65, LDL 95 ~  FLP 4/12 showed TChol 189, TG 103, HDL 70, LDL 99  DIABETES MELLITUS, BORDERLINE (ICD-790.29) - on diet alone...  ~  labs 4/08 w/ BS= 106... ~  labs 2/09 w/ BS= 170, HgA1c= 6.7.Marland KitchenMarland Kitchen rec to start Metformin 500mg /d but she declined, prefer diet... ~  labs 6/09 showed BS= 97, HgA1c= 6.6.Marland KitchenMarland Kitchen rec- continue diet Rx. ~  labs 3/10 showed BS= 129, A1c= 6.6 ~  labs 9/10 showed BS= 108 ~  labs 4/11 showed BS= 96 ~  Labs 4/12 showed BS= 89  HYPOTHYROIDISM (ICD-244.9) - on SYNTHROID 155mcg/d... ~  TSH 4/08 = 1.45... keep same dose. ~  TSH 2/09 = 0.17 on 171mcg/d dose... rec to decr to 14mcg/d... ~  TSH 6/09 = 6.20 on 147mcg/d dose... she wants ret to dose. ~  TSH 3/10 = 0.41 on 153mcg/d dose. ~  TSH 4/11 = 0.24 on 126mcg/d dose... She doesn't want to decr the dose. ~  TSH 4/12 = 0.83 on 163mcg/d dose  DIVERTICULOSIS OF COLON (ICD-562.10) & CONSTIPATION (ICD-564.00) - we discussed a regimen to prevent constipation & she uses MIRALAX Prn... ~  Flex Sig 6/96 by DrSam showed divertics, otherw normal left colon...  DEGENERATIVE JOINT DISEASE (ICD-715.90) - she takes  Glucosamine/ Chondroitin... LOW BACK PAIN & ?Neurogenic claudication in legs> ~  she persisits w/ mult somatic complaints--- she states she has tried off Simvastatin, Lyrica, Fosamax & others but nothing helps and her symptoms persist... XRays showed mild DJD both hips & lower spine; & BMD 5/09 showed min osteopenia w/ TScores -0.0 in Spine & -1.4 in right FemNeck... she has tried Rx w/ Tramadol, Advil, Osteobiflex, heat, etc...  ~  eval by Ortho- DrRamos w/ LBP, multilevel DDD, spondylolisthesis L5-S1 & bilat leg pain> she's tried phys therapy, water exercises, meds (no benefit); epid steroid shots have helped some...  OSTEOPOROSIS (ICD-733.00) - on ALENDRONATE 70mg /wk, calcium, vitamins... ~  BMD here 5/09 showed TScores of 0.00 in Spine, and -1.4 in the right Fem Neck ~  BMD here 9/11 showed TScores of +0.8 in Spine, and -1.7 in the right [facility]... rec to continue same med.  Hx of TRANSIENT ISCHEMIC ATTACK (ICD-435.9) - she remains on ASA and has not had any new cerebral ischemic symptoms...  PERIPHERAL NEUROPATHY (ICD-356.9) - c/o chronic leg discomfort as noted... extensive work up in the past- DrStuckey 2001 w/ normal art dopplers; DrLove in 2001 w/ a neg NCV study;  DrAplington w/ meds and TENS trial (per her request- it helped her brother)... now DrRamos w/ PT, water exercises, and epid shots... tried Lyrica w/o benefit...  ANXIETY (ICD-300.00) - she declines anxiolytic Rx.  SHINGLES (ICD-053.9) - developed shingles Mar09 involving right sacral dermatomes w/ rash and pain in the perineal and buttock areas... treated via ER & several visits w/ NP w/ Valtrex, Pred, Lyrica, Vicodin... lesions resolved, some persist post-herpetic neuralgia...  ANEMIA, MILD (ICD-285.9) ~  labs 9/09 showed Hg= 11.8.[name]. ~  labs 3/10 showed Hg= 11.6, Fe= 64 (sat=20%) ~  labs 4/11 showed = Hg= 11.4 ~  Labs 4/12 showed Hg= 11.9  HEALTH MAINTENANCE:  she had the 2010 Flu shot 9/10... PNEUMOVAX in 2006 age age  66...   Past Surgical History  Procedure Date  . Pacemaker insertion     MDT  . Appendectomy   . Cholecystectomy   . Tonsillectomy   . Vaginal hysterectomy   . Cataract extraction, bilateral end of June 2013  . Cataract extraction, bilateral beginning of July 2013    Outpatient Encounter Prescriptions as of 03/19/2012  Medication Sig Dispense Refill  . alendronate (FOSAMAX) 70 MG tablet Take 1 tablet (70 mg total) by mouth every 7 (seven) days. Take with a full glass of water on an empty stomach.  12 tablet  3  . Alpha-Lipoic Acid 200 MG CAPS Take 1 capsule by mouth daily.        [name] aspirin 325 MG tablet  Take 325 mg by mouth daily.        [name] azithromycin (ZITHROMAX) 250 MG tablet Take as directed  6 each  0  . Calcium Carbonate-Vitamin D (CALTRATE 600+D) 600-400 MG-UNIT per tablet Take 1 tablet by mouth daily.        . cholecalciferol (VITAMIN D) 1000 UNITS tablet Take 1,000 Units by mouth daily.        [name] co-enzyme Q-10 30 MG capsule Take 30 mg by mouth daily.        . Cyanocobalamin (B-12) 2500 MCG TABS Take 1 tablet by mouth every other day.       . docusate sodium (COLACE) 100 MG capsule Take 100 mg by mouth at bedtime.       . fish oil-omega-3 fatty acids 1000 MG capsule Take 1 g by mouth daily.        [name] glucose blood test strip 1 each by Other route as needed. Use as instructed       . Homeopathic Products ([employer] LEG CRAMPS PAIN RELIEF PO) 3 tablets a day as needed for leg cramps      . polyethylene glycol (MIRALAX / GLYCOLAX) packet Take 17 g by mouth daily as needed.       . pregabalin (LYRICA) 75 MG capsule Take 1 capsule (75 mg total) by mouth at bedtime.  90 capsule  3  . Probiotic Product (PROBIOTIC FORMULA) CAPS Take 1 capsule by mouth daily.        . simvastatin (ZOCOR) 40 MG tablet Take 1 tablet (40 mg total) by mouth daily.  90 tablet  4  . SYNTHROID 125 MCG tablet TAKE 1 TABLET DAILY  90 tablet  3  . valsartan-hydrochlorothiazide (DIOVAN-HCT) 80-12.5 MG per tablet TAKE 1  TABLET BY MOUTH DAILY  90 tablet  3    Allergies  Allergen Reactions  . Alprazolam     REACTION: eyes swell up  . Bactrim (Sulfamethoxazole W-Trimethoprim)     Unable to remember the side effects  . Ceclor (Cefaclor)     Unsure of the side effects  . Cephalexin     Unsure of the side effects  . Clindamycin/Lincomycin     Upset stomach  . Hydrocodone     Unsure of reaction to med  . Noroxin (Norfloxacin)     Unsure of side effects  . Penicillins     REACTION: swells up all over body, pt states she almost died  . Sulfonamide Derivatives     REACTION: itching  . Tetracyclines & Related     Unsure of the reaction to this medication    Current Medications, Allergies, Past Medical History, Past Surgical History, Family History, and Social History were reviewed in [employer] record.    Review of Systems        Constitutional:   No  weight loss, night sweats,  Fevers, chills,+ fatigue, or  lassitude.  HEENT:   No headaches,  Difficulty swallowing,  Tooth/dental problems, or  Sore throat,                No sneezing, itching, ear ache, nasal congestion, post nasal drip,          Objective:   Physical Exam      WD, WN, 78 y/o WF in NAD;  VS- reviewed. GENERAL:  Alert & oriented; pleasant & cooperative... HEENT:  [place]/AT,EAC wax impaction on right and left clear     Assessment & Plan:

## 2012-03-19 NOTE — Assessment & Plan Note (Signed)
Right ear cerumen impaction, ear irrigation w/ wax extraction, tolerated well.  Advised on debrox As needed

## 2012-04-21 DIAGNOSIS — D0439 Carcinoma in situ of skin of other parts of face: Secondary | ICD-10-CM | POA: Diagnosis not present

## 2012-04-21 DIAGNOSIS — B351 Tinea unguium: Secondary | ICD-10-CM | POA: Diagnosis not present

## 2012-04-21 DIAGNOSIS — C4492 Squamous cell carcinoma of skin, unspecified: Secondary | ICD-10-CM

## 2012-04-21 DIAGNOSIS — S90129A Contusion of unspecified lesser toe(s) without damage to nail, initial encounter: Secondary | ICD-10-CM | POA: Diagnosis not present

## 2012-04-21 DIAGNOSIS — D0430 Carcinoma in situ of skin of unspecified part of face: Secondary | ICD-10-CM | POA: Diagnosis not present

## 2012-04-21 HISTORY — DX: Squamous cell carcinoma of skin, unspecified: C44.92

## 2012-04-23 ENCOUNTER — Other Ambulatory Visit: Payer: Self-pay | Admitting: Pulmonary Disease

## 2012-05-07 ENCOUNTER — Telehealth: Payer: Self-pay | Admitting: Internal Medicine

## 2012-05-07 NOTE — Telephone Encounter (Signed)
Per pt she took a life line screening and she was told her plaque build up is a lot worse than last year and she wants to know is there anything she can do about it

## 2012-05-07 NOTE — Telephone Encounter (Signed)
She is going to bring me the copies of studies and I will let Dr Johney Frame review

## 2012-05-17 ENCOUNTER — Encounter: Payer: Self-pay | Admitting: Internal Medicine

## 2012-05-17 NOTE — Telephone Encounter (Signed)
This encounter was created in error - please disregard.

## 2012-05-17 NOTE — Telephone Encounter (Signed)
Follow up on call   C/O pain under left breast. Down to left arm.  Taken an extra ASA .

## 2012-05-17 NOTE — Telephone Encounter (Signed)
Kayla Davis 05/17/2012 10:32 AM Signed  New Problem:  Patient called in because she is having some pain starting in her neck and running down her left arm. Patient reports that this is the only symptom. Please call back.

## 2012-05-17 NOTE — Telephone Encounter (Deleted)
~   NuclearStressTest 3/01 showed impaired exerc capacity & ST depression but no ischemia or infarction on images and EF=72%...  ~ 2DEcho 10/06 showed no regional wall motion abn, EF= 70%, mild incr AV thickness w/ reduced excursion, mild AI, sl decr RV function.  ~ 2DEcho 5/11 showed norm LV size & function, EF=55-60%, mild AI w/ sl thickening of valve, mild MR, mod TR, grI DD.Marland KitchenMarland Kitchen

## 2012-05-17 NOTE — Telephone Encounter (Addendum)
Pain in left breast and arm last 2 nights lasting 2-3 min. On and off all day today.  No other symptoms.    ~ NuclearStressTest 3/01 showed impaired exerc capacity & ST depression but no ischemia or infarction on images and EF=72%...  ~ 2DEcho 10/06 showed no regional wall motion abn, EF= 70%, mild incr AV thickness w/ reduced excursion, mild AI, sl decr RV function.  ~ 2DEcho 5/11 showed norm LV size & function, EF=55-60%, mild AI w/ sl thickening of valve, mild MR, mod TR, grI DD.Kayla KitchenMarland Davis

## 2012-05-17 NOTE — Telephone Encounter (Signed)
Discussed with Dr Johney Frame.  I will add patient on for tomorrow Sharrell Ku to see.  Patient is aware

## 2012-05-17 NOTE — Telephone Encounter (Signed)
New Problem:    Patient called in because she is having some pain starting in her neck and running down her left arm.  Patient reports that this is the only symptom.  Please call back.

## 2012-05-18 ENCOUNTER — Other Ambulatory Visit: Payer: Self-pay | Admitting: Internal Medicine

## 2012-05-18 ENCOUNTER — Encounter: Payer: Self-pay | Admitting: Internal Medicine

## 2012-05-18 ENCOUNTER — Encounter: Payer: Self-pay | Admitting: Cardiology

## 2012-05-18 ENCOUNTER — Ambulatory Visit (INDEPENDENT_AMBULATORY_CARE_PROVIDER_SITE_OTHER): Payer: MEDICARE | Admitting: Cardiology

## 2012-05-18 VITALS — BP 118/62 | HR 68 | Ht 65.0 in | Wt 155.0 lb

## 2012-05-18 DIAGNOSIS — I495 Sick sinus syndrome: Secondary | ICD-10-CM

## 2012-05-18 DIAGNOSIS — R0789 Other chest pain: Secondary | ICD-10-CM | POA: Diagnosis not present

## 2012-05-18 DIAGNOSIS — Z950 Presence of cardiac pacemaker: Secondary | ICD-10-CM

## 2012-05-18 LAB — PACEMAKER DEVICE OBSERVATION
AL AMPLITUDE: 4 mv
AL IMPEDENCE PM: 494 Ohm
ATRIAL PACING PM: 27.4
BATTERY VOLTAGE: 2.78 V
RV LEAD IMPEDENCE PM: 554 Ohm
VENTRICULAR PACING PM: 70.2

## 2012-05-18 NOTE — Progress Notes (Signed)
ELECTROPHYSIOLOGY OFFICE NOTE  Patient ID: Kayla Davis MRN: [id number], DOB/AGE: 1922/06/21   Date of Visit: 05/18/2012  Primary Physician: Kayla Mcalpine, MD Primary Cardiologist: Kayla Range, MD Reason for Visit: Neck and left arm pain  History of Present Illness Kayla Davis is an 79 year old woman with sinus pauses s/p PPM implant in October 2011 by Dr. Deborah Davis, normal LV function by echo May 2011, recurrent bronchitis, HTN, dyslipidemia and hypothyroidism who presents to clinic today regarding concerns regarding neck and left arm pain. She called our office to let Dr. Johney Davis know about recent screening tests done at her church and also reported neck and left arm pain. She was then scheduled for today's visit.   [name] reports on Sunday she noticed discomfort starting on the left side of her neck that radiated to her left chest and left arm. This occurred while she was lying in bed. She denies any alleviating/aggravating factors or change with position or respiration. She denies recent illness, fever, chills or cough. She states it would last about an hour and recurred throughout the morning yesterday. She denies SOB, nausea or diaphoresis. She denies palpitations, dizziness, near syncope or syncope. She has never experienced symptoms similar to this in the past. She denies any exertional component/symptoms. She has not had any recurrence since yesterday evening. She slept well last night and feels back to her usual self today. Of note, upon further questioning, she tells me she started a new exercise regimen 1 week ago. She has been placing a "broomstick" across her shoulders and twisting at her her waist for several repetitions as an upper body strengthening exercise.   Pertinent cardiac imaging history: - Nuclear stress test March 2001 showed impaired exercise capacity, ST depression on ECG but no ischemia or infarction on images; EF normal, 72% - Transthoracic echo May 2011  showed normal LV size and function, EF 55-60%, mild AI, mild MR, mod TR, grade I diastolic dysfunction    Past Medical History Past Medical History  Diagnosis Date  . Hypertension   . Hyperlipidemia   . Aortic valve disorders   . Unspecified hereditary and idiopathic peripheral neuropathy   . Other abnormal glucose   . Unspecified hearing loss   . Unspecified chronic bronchitis   . Unspecified hypothyroidism   . Unspecified gastritis and gastroduodenitis without mention of hemorrhage   . Diverticulosis of colon (without mention of hemorrhage)   . Irritable bowel syndrome   . Osteoarthrosis, unspecified whether generalized or localized, unspecified site   . Backache, unspecified   . Pain in limb   . Osteoporosis, unspecified   . Herpes zoster with other nervous system complications   . Anxiety state, unspecified   . Anemia, unspecified   . Bradycardia     s/p PPM    Past Surgical History Past Surgical History  Procedure Date  . Pacemaker insertion     MDT  . Appendectomy   . Cholecystectomy   . Tonsillectomy   . Vaginal hysterectomy   . Cataract extraction, bilateral end of June 2013  . Cataract extraction, bilateral beginning of July 2013     Allergies/Intolerances Allergies  Allergen Reactions  . Alprazolam     REACTION: eyes swell up  . Bactrim (Sulfamethoxazole W-Trimethoprim)     Unable to remember the side effects  . Ceclor (Cefaclor)     Unsure of the side effects  . Cephalexin     Unsure of the side effects  . Clindamycin/Lincomycin  Upset stomach  . Hydrocodone     Unsure of reaction to med  . Noroxin (Norfloxacin)     Unsure of side effects  . Penicillins     REACTION: swells up all over body, pt states she almost died  . Sulfonamide Derivatives     REACTION: itching  . Tetracyclines & Related     Unsure of the reaction to this medication    Current Home Medications Current Outpatient Prescriptions  Medication Sig Dispense Refill  .  alendronate (FOSAMAX) 70 MG tablet TAKE 1 TABLET EVERY 7 DAYS.TAKE WITH A FULL GLASS OF  WATER ON AN EMPTY STOMACH  12 tablet  3  . Alpha-Lipoic Acid 200 MG CAPS Take 1 capsule by mouth daily.        [name] aspirin 325 MG tablet Take 325 mg by mouth daily.        [name] azithromycin (ZITHROMAX) 250 MG tablet Take as directed  6 each  0  . Calcium Carbonate-Vitamin D (CALTRATE 600+D) 600-400 MG-UNIT per tablet Take 1 tablet by mouth daily.        . cholecalciferol (VITAMIN D) 1000 UNITS tablet Take 1,000 Units by mouth daily.        [name] co-enzyme Q-10 30 MG capsule Take 30 mg by mouth daily.        . Cyanocobalamin (B-12) 2500 MCG TABS Take 1 tablet by mouth every other day.       . docusate sodium (COLACE) 100 MG capsule Take 100 mg by mouth at bedtime.       . fish oil-omega-3 fatty acids 1000 MG capsule Take 1 g by mouth daily.        [name] glucose blood test strip 1 each by Other route as needed. Use as instructed       . Homeopathic Products ([employer]S LEG CRAMPS PAIN RELIEF PO) 3 tablets a day as needed for leg cramps      . polyethylene glycol (MIRALAX / GLYCOLAX) packet Take 17 g by mouth daily as needed.       . pregabalin (LYRICA) 75 MG capsule Take 1 capsule (75 mg total) by mouth at bedtime.  90 capsule  3  . Probiotic Product (PROBIOTIC FORMULA) CAPS Take 1 capsule by mouth daily.        . simvastatin (ZOCOR) 40 MG tablet Take 1 tablet (40 mg total) by mouth daily.  90 tablet  4  . SYNTHROID 125 MCG tablet TAKE 1 TABLET DAILY  90 tablet  3  . valsartan-hydrochlorothiazide (DIOVAN-HCT) 80-12.5 MG per tablet TAKE 1 TABLET BY MOUTH DAILY  90 tablet  3    Social History Social History  . Marital Status: Married    Spouse Name: Kyung Rudd.  (deceased)    Number of Children: 4   Social History Main Topics  . Smoking status: Never Smoker   . Smokeless tobacco: Never Used  . Alcohol Use: No  . Drug Use: No   Review of Systems General: No chills, fever, night sweats or weight changes Cardiovascular: No  chest pain, dyspnea on exertion, edema, orthopnea, palpitations, paroxysmal nocturnal dyspnea Dermatological: No rash, lesions or masses Respiratory: No cough, dyspnea Urologic: No hematuria, dysuria Abdominal: No nausea, vomiting, diarrhea, bright red blood per rectum, melena, or hematemesis Neurologic: No visual changes, weakness, changes in mental status All other systems reviewed and are otherwise negative except as noted above.  Physical Exam There were no vitals taken for this visit.  General: Well developed, well appearing  79 year old female in no acute distress. HEENT: Normocephalic, atraumatic. EOMs intact. Sclera nonicteric. Oropharynx clear.  Neck: Supple without bruits. No JVD. Lungs: Respirations regular and unlabored, CTA bilaterally. No wheezes, rales or rhonchi. Heart: RRR. S1, S2 present. II/VI systolic murmur. No rub, S3 or S4. Abdomen: Soft, non-tender, non-distended. BS present x 4 quadrants. No hepatosplenomegaly.  Extremities: No clubbing, cyanosis or edema. DP/PT/Radials 2+ and equal bilaterally. Psych: Normal affect. Neuro: Alert and oriented X 3. Moves all extremities spontaneously.   Diagnostics Device interrogation performed by me in clinic today shows normal PPM function with good battery status and stable lead measurements/parameters; she is not PPM dependent; 2 mode switches, <0.1% of time, 0 AHR episodes; 0 VHR episodes; histograms appropriate; no programming changes made; see PaceArt report  Assessment and Plan 1. Chest pain Now resolved No known CAD and atypical for angina/ACS ? musculoskeletal etiology given recent start of exercise regimen (told her to discontinue that particular exercise; she should walk for aerobic exercise) If recurs, she will let us know 2. Sinus pauses s/p PPM implant Normal device function No programming changes made See PaceArt report Continue remote follow-up every 6 months and follow-up with Dr. Johney Davis  annually   Signed, Rick Duff, PA-C 05/18/2012, 2:39 PM

## 2012-05-18 NOTE — Patient Instructions (Signed)
Remote monitoring is used to monitor your Pacemaker of ICD from home. This monitoring reduces the number of office visits required to check your device to one time per year. It allows [place] to keep an eye on the functioning of your device to ensure it is working properly. You are scheduled for a device check from home on 08/16/12. You may send your transmission at any time that day. If you have a wireless device, the transmission will be sent automatically. After your physician reviews your transmission, you will receive a postcard with your next transmission date.

## 2012-08-03 DIAGNOSIS — M47817 Spondylosis without myelopathy or radiculopathy, lumbosacral region: Secondary | ICD-10-CM | POA: Diagnosis not present

## 2012-08-09 DIAGNOSIS — G894 Chronic pain syndrome: Secondary | ICD-10-CM | POA: Diagnosis not present

## 2012-08-09 DIAGNOSIS — M79609 Pain in unspecified limb: Secondary | ICD-10-CM | POA: Diagnosis not present

## 2012-08-09 DIAGNOSIS — M545 Low back pain, unspecified: Secondary | ICD-10-CM | POA: Diagnosis not present

## 2012-08-16 ENCOUNTER — Ambulatory Visit (INDEPENDENT_AMBULATORY_CARE_PROVIDER_SITE_OTHER): Payer: MEDICARE | Admitting: *Deleted

## 2012-08-16 ENCOUNTER — Other Ambulatory Visit: Payer: Self-pay | Admitting: Internal Medicine

## 2012-08-16 DIAGNOSIS — Z950 Presence of cardiac pacemaker: Secondary | ICD-10-CM | POA: Diagnosis not present

## 2012-08-16 DIAGNOSIS — I495 Sick sinus syndrome: Secondary | ICD-10-CM | POA: Diagnosis not present

## 2012-08-22 LAB — REMOTE PACEMAKER DEVICE
AL IMPEDENCE PM: 501 Ohm
AL THRESHOLD: 0.5 V
ATRIAL PACING PM: 23
BAMS-0001: 175 {beats}/min
RV LEAD IMPEDENCE PM: 589 Ohm
RV LEAD THRESHOLD: 0.5 V

## 2012-08-23 DIAGNOSIS — N390 Urinary tract infection, site not specified: Secondary | ICD-10-CM | POA: Diagnosis not present

## 2012-08-23 DIAGNOSIS — R32 Unspecified urinary incontinence: Secondary | ICD-10-CM | POA: Diagnosis not present

## 2012-08-24 ENCOUNTER — Telehealth: Payer: Self-pay | Admitting: Pulmonary Disease

## 2012-08-24 NOTE — Telephone Encounter (Signed)
Pt called in & asked to closed this message & to open an new message.  Kayla Davis

## 2012-08-24 NOTE — Telephone Encounter (Signed)
Per JJ okay to bring pt in at 10 am. I called and spoke with pt and she is aware. Nothing further was needed

## 2012-08-24 NOTE — Telephone Encounter (Signed)
I spoke with pt. She stated today she had upset stomach, diarrhea and nausea. She is feeling some better now. I asked pt what has she taking and she just keep stating she wants to come in and see TP tomorrow. Jess Please advise if pt can be worked in thanks

## 2012-08-25 ENCOUNTER — Encounter: Payer: Self-pay | Admitting: Adult Health

## 2012-08-25 ENCOUNTER — Ambulatory Visit (INDEPENDENT_AMBULATORY_CARE_PROVIDER_SITE_OTHER): Payer: MEDICARE | Admitting: Adult Health

## 2012-08-25 VITALS — BP 130/80 | HR 61 | Temp 97.8°F | Ht 65.0 in | Wt 156.2 lb

## 2012-08-25 DIAGNOSIS — R197 Diarrhea, unspecified: Secondary | ICD-10-CM

## 2012-08-25 NOTE — Progress Notes (Signed)
Subjective:    Patient ID: Kayla Davis, female    DOB: 1923/03/07, 79 y.o.   MRN: [id number]  HPI 79 y/o WF    Recurrent bronchitic infections;  HBP;  Aortic valve dis;  Cardiac pacemaker;  Ven insuffic;  Hyperlipidemia;  DM;  Hypothyroid;  Divertics/ constip;  DJD/ LBP;  Osteopenia;  Hx TIA;  Periph neuropathy;  Anxiety;  Anemia; and hx Shingles...     08/25/2012 Acute OV  Complains of diarrhea x 2 days w/ nausea. No vomiting-feels like she could. Developed loose stools x 3 yesterday. None today.  Felt very nauseous. Felt that she ate some bad mandarin oranges.  Fixed biscuits and gravy to settle stomach-?helped some.  We discussed during time of GI upset to choose more bland options and push fluids.  Was seen recently by GYN w/ ? UTI started on abx but did not take first pill until this am.  Woke up this am with no diarrhea or urinary symptoms.  Appetite is good. Does have some mild nausea. No vomiting.  No fever, abd pains or bloody stools. No recent travel or abx (diarrhea started before first dose of abx this am) .         Problem List:  HEARING LOSS (ICD-389.9) - she has bilat hearing aides.  Hx of BRONCHITIS, RECURRENT >> Hx Pneumonia >> ~  8/12: she had CAP in [place] treated w/ Avelox & slowly resolved back to baseline... ~  CXR 9/12 w/ sl pectus excavatum, mod thor spondylosis, mild cardiomeg, pacer, mild left base scarring, NAD...  HYPERTENSION (ICD-401.9) - controlled on DIOVAN/Hct 80-12.5 daily...  ~  4/12:  BP=120/58 and tol med well... she denies HA, fatigue, visual changes, CP, palipit, ch in dyspnea, edema, etc... ~  1/13:  BP= 142/66 & she remains largely asymptomatic...   CHEST WALL PAIN, HX OF (ICD-V15.89)  AORTIC VALVE DISORDERS (ICD-424.1)- s/p cardiac work up in 2001... currently denies CP etc... ~  NuclearStressTest 3/01 showed impaired exerc capacity & ST depression but no ischemia or infarction on images and EF=72%... ~  2DEcho 10/06 showed no regional  wall motion abn, EF= 70%, mild incr AV thickness w/ reduced excursion, mild AI, sl decr RV function. ~  2DEcho 5/11 showed norm LV size & function, EF=55-60%, mild AI w/ sl thickening of valve, mild MR, mod TR, grI DD...  PACER for Pre-syncope>  Hosp 10/11 for presyncope w/ LBBB & pauses ==> pacer placed & she is now stable, asymptomatic, & followed in the device clinic by DrAllred... ~  10/12: she had f/u DrAllred, doing well, normal pacer function; she was complaining of leg weakness she though due to statin rx & they held it for a month...  VENOUS INSUFFICIENCY (ICD-459.81) - she has mod VV, VI, & chr leg symptoms... good arterial pulses distally, no ischemic symptoms. ~  11/12:  Venous Dopplers done for leg pain were neg for DVT, no superfic thrombus or incompetence either...  HYPERLIPIDEMIA (ICD-272.4) - on SIMVASTATIN 40mg /d & she has stopped it on several occas due to leg discomfort to see if this would help- it has never lead to improved leg symptoms!... also takes FISH OIL daily. ~  FLP 4/08 on Simvastatin 40mg /d showed TChol 171, TG 88, HDL 58, LDL 96... ~  FLP 6/09 showed TChol 220, TG 98, HDL 58, LDL 143... rec to restart her Simvast40. ~  10/10: not fasting at present & we discussed f/u FLP on ret in 2011 ~  FLP 4/11 showed TChol  176, TG 81, HDL 65, LDL 95 ~  FLP 4/12 showed TChol 189, TG 103, HDL 70, LDL 99  DIABETES MELLITUS, BORDERLINE (ICD-790.29) - on diet alone...  ~  labs 4/08 w/ BS= 106... ~  labs 2/09 w/ BS= 170, HgA1c= 6.7.Marland KitchenMarland Kitchen rec to start Metformin 500mg /d but she declined, prefer diet... ~  labs 6/09 showed BS= 97, HgA1c= 6.6.Marland KitchenMarland Kitchen rec- continue diet Rx. ~  labs 3/10 showed BS= 129, A1c= 6.6 ~  labs 9/10 showed BS= 108 ~  labs 4/11 showed BS= 96 ~  Labs 4/12 showed BS= 89  HYPOTHYROIDISM (ICD-244.9) - on SYNTHROID 116mcg/d... ~  TSH 4/08 = 1.45... keep same dose. ~  TSH 2/09 = 0.17 on 169mcg/d dose... rec to decr to 140mcg/d... ~  TSH 6/09 = 6.20 on 182mcg/d dose...  she wants ret to dose. ~  TSH 3/10 = 0.41 on 184mcg/d dose. ~  TSH 4/11 = 0.24 on 1100mcg/d dose... She doesn't want to decr the dose. ~  TSH 4/12 = 0.83 on 137mcg/d dose  DIVERTICULOSIS OF COLON (ICD-562.10) & CONSTIPATION (ICD-564.00) - we discussed a regimen to prevent constipation & she uses MIRALAX Prn... ~  Flex Sig 6/96 by DrSam showed divertics, otherw normal left colon...  DEGENERATIVE JOINT DISEASE (ICD-715.90) - she takes Glucosamine/ Chondroitin... LOW BACK PAIN & ?Neurogenic claudication in legs> ~  she persisits w/ mult somatic complaints--- she states she has tried off Simvastatin, Lyrica, Fosamax & others but nothing helps and her symptoms persist... XRays showed mild DJD both hips & lower spine; & BMD 5/09 showed min osteopenia w/ TScores -0.0 in Spine & -1.4 in right FemNeck... she has tried Rx w/ Tramadol, Advil, Osteobiflex, heat, etc...  ~  eval by Ortho- DrRamos w/ LBP, multilevel DDD, spondylolisthesis L5-S1 & bilat leg pain> she's tried phys therapy, water exercises, meds (no benefit); epid steroid shots have helped some...  OSTEOPOROSIS (ICD-733.00) - on ALENDRONATE 70mg /wk, calcium, vitamins... ~  BMD here 5/09 showed TScores of 0.00 in Spine, and -1.4 in the right Fem Neck ~  BMD here 9/11 showed TScores of +0.8 in Spine, and -1.7 in the right [facility]... rec to continue same med.  Hx of TRANSIENT ISCHEMIC ATTACK (ICD-435.9) - she remains on ASA and has not had any new cerebral ischemic symptoms...  PERIPHERAL NEUROPATHY (ICD-356.9) - c/o chronic leg discomfort as noted... extensive work up in the past- DrStuckey 2001 w/ normal art dopplers; DrLove in 2001 w/ a neg NCV study;  DrAplington w/ meds and TENS trial (per her request- it helped her brother)... now DrRamos w/ PT, water exercises, and epid shots... tried Lyrica w/o benefit...  ANXIETY (ICD-300.00) - she declines anxiolytic Rx.  SHINGLES (ICD-053.9) - developed shingles Mar09 involving right sacral  dermatomes w/ rash and pain in the perineal and buttock areas... treated via ER & several visits w/ NP w/ Valtrex, Pred, Lyrica, Vicodin... lesions resolved, some persist post-herpetic neuralgia...  ANEMIA, MILD (ICD-285.9) ~  labs 9/09 showed Hg= 11.8.[name]. ~  labs 3/10 showed Hg= 11.6, Fe= 64 (sat=20%) ~  labs 4/11 showed = Hg= 11.4 ~  Labs 4/12 showed Hg= 11.9  HEALTH MAINTENANCE:  she had the 2010 Flu shot 9/10... PNEUMOVAX in 2006 age age 9...   Past Surgical History  Procedure Laterality Date  . Pacemaker insertion      MDT  . Appendectomy    . Cholecystectomy    . Tonsillectomy    . Vaginal hysterectomy    . Cataract extraction, bilateral  end of June 2013  . Cataract extraction, bilateral  beginning of July 2013    Outpatient Encounter Prescriptions as of 08/25/2012  Medication Sig Dispense Refill  . alendronate (FOSAMAX) 70 MG tablet TAKE 1 TABLET EVERY 7 DAYS.TAKE WITH A FULL GLASS OF  WATER ON AN EMPTY STOMACH  12 tablet  3  . Alpha-Lipoic Acid 200 MG CAPS Take 1 capsule by mouth daily.        [name] aspirin 325 MG tablet Take 325 mg by mouth daily.        . Calcium Carbonate-Vitamin D (CALTRATE 600+D) 600-400 MG-UNIT per tablet Take 1 tablet by mouth daily.        . cholecalciferol (VITAMIN D) 1000 UNITS tablet Take 1,000 Units by mouth daily.        [name] co-enzyme Q-10 30 MG capsule Take 30 mg by mouth daily.        . Cyanocobalamin (B-12) 2500 MCG TABS Take 1 tablet by mouth every other day.       . docusate sodium (COLACE) 100 MG capsule Take 100 mg by mouth at bedtime.       . fish oil-omega-3 fatty acids 1000 MG capsule Take 1 g by mouth daily.        [name] glucose blood test strip 1 each by Other route as needed. Use as instructed       . Homeopathic Products ([employer] LEG CRAMPS PAIN RELIEF PO) 3 tablets a day as needed for leg cramps      . polyethylene glycol (MIRALAX / GLYCOLAX) packet Take 17 g by mouth daily as needed.       . pregabalin (LYRICA) 75 MG capsule Take 1 capsule  (75 mg total) by mouth at bedtime.  90 capsule  3  . Probiotic Product (PROBIOTIC FORMULA) CAPS Take 1 capsule by mouth daily.        . simvastatin (ZOCOR) 40 MG tablet Take 1 tablet (40 mg total) by mouth daily.  90 tablet  4  . SYNTHROID 125 MCG tablet TAKE 1 TABLET DAILY  90 tablet  3  . valsartan-hydrochlorothiazide (DIOVAN-HCT) 80-12.5 MG per tablet TAKE 1 TABLET BY MOUTH DAILY  90 tablet  3  . nitrofurantoin (MACRODANTIN) 100 MG capsule Take 1 capsule by mouth 2 (two) times daily.       No facility-administered encounter medications on file as of 08/25/2012.    Allergies  Allergen Reactions  . Alprazolam     REACTION: eyes swell up  . Bactrim (Sulfamethoxazole W-Trimethoprim)     Unable to remember the side effects  . Ceclor (Cefaclor)     Unsure of the side effects  . Cephalexin     Unsure of the side effects  . Clindamycin/Lincomycin     Upset stomach  . Hydrocodone     Unsure of reaction to med  . Noroxin (Norfloxacin)     Unsure of side effects  . Penicillins     REACTION: swells up all over body, pt states she almost died  . Sulfonamide Derivatives     REACTION: itching  . Tetracyclines & Related     Unsure of the reaction to this medication    Current Medications, Allergies, Past Medical History, Past Surgical History, Family History, and Social History were reviewed in [employer] record.    Review of Systems        Constitutional:   No  weight loss, night sweats,  Fevers, chills, fatigue, or  lassitude.  HEENT:   No headaches,  Difficulty swallowing,  Tooth/dental problems, or  Sore throat,                No sneezing, itching, ear ache, nasal congestion, post nasal drip,   CV:  No chest pain,  Orthopnea, PND, swelling in lower extremities, anasarca, dizziness, palpitations, syncope.   GI  No heartburn, indigestion, abdominal pain,  loss of appetite, bloody stools.   Resp: No shortness of breath with exertion or at rest.  No excess  mucus, no productive cough,  No non-productive cough,  No coughing up of blood.  No change in color of mucus.  No wheezing.  No chest wall deformity  Skin: no rash or lesions.  GU: no dysuria, change in color of urine, no urgency or frequency.  No flank pain,    MS:  No joint pain or swelling.  No decreased range of motion.  No back pain.  Psych:  No change in mood or affect. No depression or anxiety.  No memory loss.            Objective:   Physical Exam     GEN: A/Ox3; pleasant , NAD, well nourished   HEENT:  [place]/AT,  EACs-clear, TMs-wnl, NOSE-clear, THROAT-clear, no lesions, no postnasal drip or exudate noted.   NECK:  Supple w/ fair ROM; no JVD; normal carotid impulses w/o bruits; no thyromegaly or nodules palpated; no lymphadenopathy.  RESP  Clear  P & A; w/o, wheezes/ rales/ or rhonchi.no accessory muscle use, no dullness to percussion  CARD:  RRR, no m/r/g  , no peripheral edema, pulses intact, no cyanosis or clubbing.  GI:   Soft & nt; nml bowel sounds; no organomegaly or masses detected.no guarding or rebound,  Neg CVA tenderness.   Musco: Warm bil, no deformities or joint swelling noted.   Neuro: alert, no focal deficits noted.    Skin: Warm, no lesions or rashes      Assessment & Plan:

## 2012-08-25 NOTE — Patient Instructions (Addendum)
Advance a bland diet as tolerated .  Take the probiotic daily  Hold stool softner for few days until diarrhea is resolved and normal bowel movement return.  Drink plenty of fluids.  Gas X with meals and As needed   follow up Dr. Kriste Basque  Next month as planned and As needed   Please contact office for sooner follow up if symptoms do not improve or worsen or seek emergency care

## 2012-08-26 DIAGNOSIS — R197 Diarrhea, unspecified: Secondary | ICD-10-CM | POA: Insufficient documentation

## 2012-08-26 NOTE — Assessment & Plan Note (Signed)
Diarrhea -appears to be self limiting w/ improvement on day 2 .  Advised on more of a bland diet w/ advancement as tolerated.   Plan  Advance a bland diet as tolerated .  Take the probiotic daily  Hold stool softner for few days until diarrhea is resolved and normal bowel movement return.  Drink plenty of fluids.  Gas X with meals and As needed   follow up Dr. Kriste Basque  Next month as planned and As needed   Please contact office for sooner follow up if symptoms do not improve or worsen or seek emergency care

## 2012-09-02 ENCOUNTER — Encounter: Payer: Self-pay | Admitting: *Deleted

## 2012-09-03 ENCOUNTER — Telehealth: Payer: Self-pay | Admitting: Pulmonary Disease

## 2012-09-03 MED ORDER — VALSARTAN-HYDROCHLOROTHIAZIDE 80-12.5 MG PO TABS: ORAL_TABLET | ORAL | Status: DC

## 2012-09-03 MED ORDER — LEVOTHYROXINE SODIUM 125 MCG PO TABS: ORAL_TABLET | ORAL | Status: DC

## 2012-09-03 NOTE — Telephone Encounter (Signed)
I called [facility] and was advised they do not show were they sent [place] anything on pt. Pt needed refill on BP med and zoloft. Nothing further was needed and rx was sent

## 2012-09-10 DIAGNOSIS — R32 Unspecified urinary incontinence: Secondary | ICD-10-CM | POA: Diagnosis not present

## 2012-09-10 DIAGNOSIS — N816 Rectocele: Secondary | ICD-10-CM | POA: Diagnosis not present

## 2012-09-14 ENCOUNTER — Ambulatory Visit (INDEPENDENT_AMBULATORY_CARE_PROVIDER_SITE_OTHER): Payer: MEDICARE | Admitting: Pulmonary Disease

## 2012-09-14 ENCOUNTER — Encounter: Payer: Self-pay | Admitting: Pulmonary Disease

## 2012-09-14 ENCOUNTER — Ambulatory Visit (INDEPENDENT_AMBULATORY_CARE_PROVIDER_SITE_OTHER): Payer: MEDICARE

## 2012-09-14 VITALS — BP 138/62 | HR 68 | Temp 98.0°F | Ht 64.0 in | Wt 156.8 lb

## 2012-09-14 DIAGNOSIS — R7309 Other abnormal glucose: Secondary | ICD-10-CM

## 2012-09-14 DIAGNOSIS — F411 Generalized anxiety disorder: Secondary | ICD-10-CM | POA: Diagnosis not present

## 2012-09-14 DIAGNOSIS — E785 Hyperlipidemia, unspecified: Secondary | ICD-10-CM

## 2012-09-14 DIAGNOSIS — Z950 Presence of cardiac pacemaker: Secondary | ICD-10-CM | POA: Diagnosis not present

## 2012-09-14 DIAGNOSIS — D649 Anemia, unspecified: Secondary | ICD-10-CM

## 2012-09-14 DIAGNOSIS — M79609 Pain in unspecified limb: Secondary | ICD-10-CM

## 2012-09-14 DIAGNOSIS — I10 Essential (primary) hypertension: Secondary | ICD-10-CM

## 2012-09-14 DIAGNOSIS — R300 Dysuria: Secondary | ICD-10-CM

## 2012-09-14 DIAGNOSIS — G609 Hereditary and idiopathic neuropathy, unspecified: Secondary | ICD-10-CM

## 2012-09-14 DIAGNOSIS — I359 Nonrheumatic aortic valve disorder, unspecified: Secondary | ICD-10-CM

## 2012-09-14 DIAGNOSIS — M549 Dorsalgia, unspecified: Secondary | ICD-10-CM

## 2012-09-14 DIAGNOSIS — I872 Venous insufficiency (chronic) (peripheral): Secondary | ICD-10-CM | POA: Diagnosis not present

## 2012-09-14 DIAGNOSIS — E039 Hypothyroidism, unspecified: Secondary | ICD-10-CM

## 2012-09-14 DIAGNOSIS — M1990 Unspecified osteoarthritis, unspecified site: Secondary | ICD-10-CM

## 2012-09-14 DIAGNOSIS — M810 Age-related osteoporosis without current pathological fracture: Secondary | ICD-10-CM

## 2012-09-14 LAB — URINALYSIS, ROUTINE W REFLEX MICROSCOPIC

## 2012-09-14 MED ORDER — GLUCOSE BLOOD VI STRP: ORAL_STRIP | Status: DC

## 2012-09-14 NOTE — Progress Notes (Signed)
Subjective:    Patient ID: [name], female    DOB: 1923-01-20, 79 y.o.   MRN: [id number]  HPI 79 y/o WF here for a follow up visit... she has mult med problems including:  Recurrent bronchitic infections;  HBP;  Aortic valve dis;  Cardiac pacemaker;  Ven insuffic;  Hyperlipidemia;  DM;  Hypothyroid;  Divertics/ constip;  DJD/ LBP;  Osteopenia;  Hx TIA;  Periph neuropathy;  Anxiety;  Anemia; and hx Shingles...  ~  May 14, 2011:  68mo ROV & she has been under the care of Podiatrist DrPetrinitz for ?toe infection treated w/ Cipro & Clinda==> improved;  She had [facility] screen & brings report of:  1) mild bilat carotid stenoses;  2) Neg abd ao Sonar w/o aneurysm;  3) ABIs are wnl;  4) BMI is wnl at 24 (149#, 60.5" tall)...    Hx Bronchitis, Pneumonia> resolved, no resid infection, CXR 9/12 reviewed...    HBP> controlled on DiovanHCT 80-12.5; BP= 142/66 & she denies CP, palpit, dizzy, SOB, edema, etc...    Cardiac pacer> followed by DrAllred for presyncope, LBBB, and pauses; seen 10/12 & pacer functioning well, continue same meds...    Ven Insuffic> she had neg ven dopplers 11/12; has mod VI, chr leg symptoms==> stable...    Chol> on Simva40 & FishOil; she has stopped it on numerous occas to see if leg symptoms improved==> no better off this med; FLP 4/12 looked good on Simva40...    Hypothyroid> stable on Synthroid 12mcg/d...    DJD, LBP> she has mult somatic complaints; ?neurogenic claudication==> she has tried PT, water exercise, meds, ESI, etc...  ~  September 16, 2011:  68mo ROV & Kayla Davis is doing very well> she has a relative who is 77 y/o still sharp/ ambulatory/ & makes her own clothes!  Her CC is LBP and neuropathy symptoms> she's received shots from DrRamos w/ help & restarted LYRICA 75mg  Qhs per TP which really helps, she says;  Had UTI 4/13 treated & everything at baseline now...      We reviewed prob list, meds, xrays and labs> see below>> LABS 6/13:  FLP- at goals on Simva40;   Chems- ok w/ BS=86, A1c=6.5  CBC- ok x Hg=11.1;  TSH=2.41;  BNP=111...  ~  March 16, 2012:  60mo ROV & Kayla Davis is about the same, notes that it is hard to walk due to her leg discomfort, back hurts, knees hurt, etc; she refuses PT & we will not force the issue given her 89 yrs... We reviewed the following medical problems during today's office visit >>     Hearing Loss> wears bilat hearing aides...    Hx Bronchitis> she has ZPak to keep on hand for prn use...    HBP> on DiovaHCT80-12.5; BP= 132/60 & she denies anginaCP, palpit, dizzy, ch in SOB, edema, etc...     Cards- CWP, AI, pacer> on ASA325; follwed by DrAllred & seen 11/13- stable, doing satis, pacer ok...    VenInsuffic> she knows to elim sodium, elev legs, wear support hose...    CHOL> on Simva40, FishOil, CoQ10; yearly FLP done 6/13 & all parameters at goals...    DM> on diet alone; yearly labs 6/13 were wnl- BS=86, A1c=6.5    Hypothy> on Synthroid125; yearly labs 6/13 confirmed euthyroid on this dose...    GI- Divertics, constip> on Miralax, Colace, Probiotics; she denies abd pain, n/v/d, blood seen...    DJD, LBP, Osteopenia> on Fosamax70/wk, calcium, MVI, VitD1000; last BMD 9/11 showed  TScore -1.7 in right FemNeck    Hx TIA, Neuropathy> on ASA325, [id number], homeopathic leg cramp med; she denies cerebral ischemic symptoms...    Anxiety> she declines anxiolytic Rx...    Anemia> Hg follow up = 11.7 w/ normal serum Fe etc... We reviewed prob list, meds, xrays and labs> see below for updates >> she had the 2013 Flu vaccine 9/13... LABS 12/13:  Chems- wnl;  CBC- mild anemia w/ Hg=11.7, MCV=94, Fe=83 (24%sat)...   ~  September 14, 2012:  66mo ROV & Kayla Davis has been c/o LBP & getting ESI shots from DrRamos, has back brace & ambulates w/ a cane "I tough it out"... She also remains under the care of GYN- had pessary placed but she reports too uncomfortable & she took it out; she reports blood in urine treated w/ Ab & resolved=> we rechecked UA & C&S-  clear, neg...     Hearing Loss> wears bilat hearing aides...    Hx Bronchitis> she has ZPak to keep on hand for prn use...    HBP> on DiovaHCT80-12.5; BP= 138/62 & she denies anginaCP, palpit, dizzy, ch in SOB, edema, etc...     Cards- CWP, AI, pacer> on ASA325; followed by DrAllred & seen 2/14- stable, doing satis, pacer ok...    VenInsuffic> she knows to elim sodium, elev legs, wear support hose...    CHOL> on Simva40, FishOil, CoQ10; FLP 6/14 showed TChol 170, TG 83, HDL 59, LDL 95    DM> on diet alone; labs 6/14 were wnl- BS=101, A1c=6.6    Hypothy> on Synthroid125; labs 6/14 showed TSH= 5.24    GI- Divertics, constip> on Miralax, Colace, Probiotics; she denies abd pain, n/v/d, blood seen...    DJD, LBP, Osteopenia> on Fosamax70/wk, calcium, MVI, VitD1000; last BMD 9/11 showed TScore -1.7 in right [facility]    Hx TIA, Neuropathy> on ASA325, [id number], homeopathic leg cramp med; she denies cerebral ischemic symptoms...    Anxiety> she declines anxiolytic Rx...    Anemia> Hg follow up = 11.7 w/ normal serum Fe etc... We reviewed prob list, meds, xrays and labs> see below for updates >>  Urinalysis C&S> Urine is clear, C&S in no growth... LABS 6/14:  FLP- at goals on Simva40;  Chems- wnl w/ BS=101 A1c=6.6;  TSH=5.24... CBC was ordered, not done...          Problem List:  HEARING LOSS (ICD-389.9) - she has bilat hearing aides & [name] has washed out her EACs...  Hx of BRONCHITIS, RECURRENT >> Hx Pneumonia >> ~  8/12: she had CAP in RLL treated w/ Avelox & slowly resolved back to baseline... ~  CXR 9/12 w/ sl pectus excavatum, mod thor spondylosis, mild cardiomeg, pacer, mild left base scarring, NAD...  HYPERTENSION (ICD-401.9) - controlled on DIOVAN/Hct 80-12.5 daily...  ~  4/12:  BP=120/58 and tol med well... she denies HA, fatigue, visual changes, CP, palipit, ch in dyspnea, edema, etc... ~  1/13:  BP= 142/66 & she remains largely asymptomatic...  ~  6/13:  BP= 126/74 & feeling well  w/o CP, palpit, dizzy, ch in SOB, edema; Creat=0.7, BNP=111. ~  6/14:  on DiovaHCT80-12.5; BP= 138/62 & she denies anginaCP, palpit, dizzy, ch in SOB, edema, etc.  CHEST WALL PAIN, HX OF (ICD-V15.89) - on ASA 325mg /d... AORTIC VALVE DISORDERS (ICD-424.1)- s/p cardiac work up in 2001... currently denies CP etc... ~  NuclearStressTest 3/01 showed impaired exerc capacity & ST depression but no ischemia or infarction on images and EF=72%... ~  2DEcho 10/06  showed no regional wall motion abn, EF= 70%, mild incr AV thickness w/ reduced excursion, mild AI, sl decr RV function. ~  2DEcho 5/11 showed norm LV size & function, EF=55-60%, mild AI w/ sl thickening of valve, mild MR, mod TR, grI DD...  PACER for Pre-syncope>  Hosp 10/11 for presyncope w/ LBBB & pauses ==> pacer placed & she is now stable, asymptomatic, & followed in the device clinic by DrAllred... ~  10/12: she had f/u DrAllred, doing well, normal pacer function; she was complaining of leg weakness she though due to statin rx & they held it for a month... ~  She maintains regular f/u w/ drAllred et al & pacer function is normal....  VENOUS INSUFFICIENCY (ICD-459.81) - she has mod VV, VI, & chr leg symptoms... good arterial pulses distally, no ischemic symptoms. ~  11/12:  Venous Dopplers done for leg pain were neg for DVT, no superfic thrombus or incompetence either...  HYPERLIPIDEMIA (ICD-272.4) - on SIMVASTATIN 40mg /d & she has stopped it on several occas due to leg discomfort to see if this would help- it has never lead to improved leg symptoms!... also takes FISH OIL daily. ~  FLP 4/08 on Simvastatin 40mg /d showed TChol 171, TG 88, HDL 58, LDL 96... ~  FLP 6/09 showed TChol 220, TG 98, HDL 58, LDL 143... rec to restart her Simvast40. ~  10/10: not fasting at present & we discussed f/u FLP on ret in 2011 ~  FLP 4/11 showed TChol 176, TG 81, HDL 65, LDL 95 ~  FLP 4/12 showed TChol 189, TG 103, HDL 70, LDL 99 ~  FLP 6/13 on Simva40 showed  TChol 161, TG 123, HDL 63, LDL 73 ~  FLP 6/14 on simva40 showed TChol 170, TG 83, HDL 59, LDL 95   DIABETES MELLITUS, BORDERLINE (ICD-790.29) - on diet alone...  ~  labs 4/08 w/ BS= 106... ~  labs 2/09 w/ BS= 170, HgA1c= 6.7.Marland KitchenMarland Kitchen rec to start Metformin 500mg /d but she declined, prefer diet... ~  labs 6/09 showed BS= 97, HgA1c= 6.6.Marland KitchenMarland Kitchen rec- continue diet Rx. ~  labs 3/10 showed BS= 129, A1c= 6.6 ~  labs 9/10 showed BS= 108 ~  labs 4/11 showed BS= 96 ~  Labs 4/12 showed BS= 89 ~  Labs 6/13 on diet alone showed BS= 86, A1c= 6.5 ~  Labs 6/14 on diet alone showed BS=101, A1c=6.6   HYPOTHYROIDISM (ICD-244.9) - on SYNTHROID 141mcg/d... ~  TSH 4/08 = 1.45... keep same dose. ~  TSH 2/09 = 0.17 on 178mcg/d dose... rec to decr to 152mcg/d... ~  TSH 6/09 = 6.20 on 185mcg/d dose... she wants ret to dose. ~  TSH 3/10 = 0.41 on 134mcg/d dose. ~  TSH 4/11 = 0.24 on 137mcg/d dose... She doesn't want to decr the dose. ~  TSH 4/12 = 0.83 on 148mcg/d dose ~  TSH 6/13 = 2.41 on 14mcg/d ~  Labs 6/14 on Synthroid125 showed TSH= 5.24  DIVERTICULOSIS OF COLON (ICD-562.10) & CONSTIPATION (ICD-564.00) - we discussed a regimen to prevent constipation & she uses MIRALAX Prn... ~  Flex Sig 6/96 by DrSam showed divertics, otherw normal left colon...  DEGENERATIVE JOINT DISEASE (ICD-715.90) - she takes Glucosamine/ Chondroitin... LOW BACK PAIN & ?Neurogenic claudication in legs> ~  she persisits w/ mult somatic complaints--- she states she has tried off Simvastatin, Lyrica, Fosamax & others but nothing helps and her symptoms persist... XRays showed mild DJD both hips & lower spine; & BMD 5/09 showed min osteopenia w/  TScores -0.0 in Spine & -1.4 in right FemNeck... she has tried Rx w/ Tramadol, Advil, Osteobiflex, heat, etc...  ~  eval by Ortho- DrRamos w/ LBP, multilevel DDD, spondylolisthesis L5-S1 & bilat leg pain> she's tried phys therapy, water exercises, meds (no benefit); epid steroid shots have helped  some... ~  6/13:  Pt back on LYRICA 75mg  Qhs at her request for neuropathy & improved, she says...  OSTEOPOROSIS (ICD-733.00) - on ALENDRONATE 70mg /wk, calcium, vitamins... ~  BMD here 5/09 showed TScores of 0.00 in Spine, and -1.4 in the right Fem Neck ~  BMD here 9/11 showed TScores of +0.8 in Spine, and -1.7 in the right [facility]... rec to continue same med.  Hx of TRANSIENT ISCHEMIC ATTACK (ICD-435.9) - she remains on ASA and has not had any new cerebral ischemic symptoms...  PERIPHERAL NEUROPATHY (ICD-356.9) - c/o chronic leg discomfort as noted... extensive work up in the past- DrStuckey 2001 w/ normal art dopplers; DrLove in 2001 w/ a neg NCV study;  DrAplington w/ meds and TENS trial (per her request- it helped her brother)... now DrRamos w/ PT, water exercises, and epid shots... tried Lyrica and now she thinks it's helping...  ANXIETY (ICD-300.00) - she declines anxiolytic Rx.  SHINGLES (ICD-053.9) - developed shingles Mar09 involving right sacral dermatomes w/ rash and pain in the perineal and buttock areas... treated via ER & several visits w/ NP w/ Valtrex, Pred, Lyrica, Vicodin... lesions resolved, some persist post-herpetic neuralgia...  ANEMIA, MILD (ICD-285.9) ~  labs 9/09 showed Hg= 11.8.[name]. ~  labs 3/10 showed Hg= 11.6, Fe= 64 (sat=20%) ~  labs 4/11 showed = Hg= 11.4 ~  Labs 4/12 showed Hg= 11.9 ~  Labs 6/13 showed Hg= 11.1, MCV=95  HEALTH MAINTENANCE:  she had the 2010 Flu shot 9/10... PNEUMOVAX in 2006 age age 84...   Past Surgical History  Procedure Laterality Date  . Pacemaker insertion      MDT  . Appendectomy    . Cholecystectomy    . Tonsillectomy    . Vaginal hysterectomy    . Cataract extraction, bilateral  end of June 2013  . Cataract extraction, bilateral  beginning of July 2013    Outpatient Encounter Prescriptions as of 09/14/2012  Medication Sig Dispense Refill  . alendronate (FOSAMAX) 70 MG tablet TAKE 1 TABLET EVERY 7 DAYS.TAKE WITH A FULL GLASS OF   WATER ON AN EMPTY STOMACH  12 tablet  3  . Alpha-Lipoic Acid 200 MG CAPS Take 1 capsule by mouth daily.        [name] aspirin 325 MG tablet Take 325 mg by mouth daily.        . Calcium Carbonate-Vitamin D (CALTRATE 600+D) 600-400 MG-UNIT per tablet Take 1 tablet by mouth daily.        . cholecalciferol (VITAMIN D) 1000 UNITS tablet Take 1,000 Units by mouth daily.        [name] co-enzyme Q-10 30 MG capsule Take 30 mg by mouth daily.        . Cyanocobalamin (B-12) 2500 MCG TABS Take 1 tablet by mouth every other day.       . docusate sodium (COLACE) 100 MG capsule Take 100 mg by mouth at bedtime.       . fish oil-omega-3 fatty acids 1000 MG capsule Take 1 g by mouth daily.        [name] glucose blood test strip 1 each by Other route as needed. Use as instructed       . Homeopathic  Products ([employer] LEG CRAMPS PAIN RELIEF PO) 3 tablets a day as needed for leg cramps      . levothyroxine (SYNTHROID) 125 MCG tablet TAKE 1 TABLET DAILY  90 tablet  3  . nitrofurantoin (MACRODANTIN) 100 MG capsule Take 1 capsule by mouth 2 (two) times daily.      . polyethylene glycol (MIRALAX / GLYCOLAX) packet Take 17 g by mouth daily as needed.       . pregabalin (LYRICA) 75 MG capsule Take 1 capsule (75 mg total) by mouth at bedtime.  90 capsule  3  . Probiotic Product (PROBIOTIC FORMULA) CAPS Take 1 capsule by mouth daily.        . simvastatin (ZOCOR) 40 MG tablet Take 1 tablet (40 mg total) by mouth daily.  90 tablet  4  . valsartan-hydrochlorothiazide (DIOVAN-HCT) 80-12.5 MG per tablet TAKE 1 TABLET BY MOUTH DAILY  90 tablet  3   No facility-administered encounter medications on file as of 09/14/2012.    Allergies  Allergen Reactions  . Alprazolam     REACTION: eyes swell up  . Bactrim (Sulfamethoxazole W-Trimethoprim)     Unable to remember the side effects  . Ceclor (Cefaclor)     Unsure of the side effects  . Cephalexin     Unsure of the side effects  . Clindamycin/Lincomycin     Upset stomach  . Hydrocodone      Unsure of reaction to med  . Noroxin (Norfloxacin)     Unsure of side effects  . Penicillins     REACTION: swells up all over body, pt states she almost died  . Sulfonamide Derivatives     REACTION: itching  . Tetracyclines & Related     Unsure of the reaction to this medication    Current Medications, Allergies, Past Medical History, Past Surgical History, Family History, and Social History were reviewed in [employer] record.    Review of Systems         See HPI - all other systems neg except as noted... The patient complains of decreased hearing, dyspnea on exertion, and difficulty walking.  The patient denies anorexia, fever, weight loss, weight gain, vision loss, hoarseness, chest pain, syncope, peripheral edema, prolonged cough, headaches, hemoptysis, abdominal pain, melena, hematochezia, severe indigestion/heartburn, hematuria, incontinence, muscle weakness, suspicious skin lesions, transient blindness, depression, unusual weight change, abnormal bleeding, enlarged lymph nodes, and angioedema.    Objective:   Physical Exam      WD, WN, 79 y/o WF in NAD;  VS- reviewed. GENERAL:  Alert & oriented; pleasant & cooperative... HEENT:  [place]/AT, EOM-full, PERRLA, EACs-clear (she has bilat hearing aides), TMs-wnl, NOSE-clear, THROAT-clear & wnl. NECK:  Supple w/ fair ROM; no JVD; normal carotid impulses w/o bruits; no thyromegaly or nodules palpated; no lymphadenopathy. CHEST:  Clear to P & A; without wheezes/ rales/ or rhonchi heard; pacer in right upper shoulder area. HEART:  Regular gr 2/6 SEM S4 without rubs... ABDOMEN:  Soft & nontender; normal bowel sounds; no organomegaly or masses detected. EXT: without deformities, mild arthritic changes; +varicose veins/ +venous insuffic/ no edema. NEURO:  CN's intact; no focal neuro deficits x decr sensation in LE's c/w periph neuropathy.  DERM:  No lesions noted; no rash etc...  RADIOLOGY DATA:  Reviewed in the EPIC  EMR & discussed w/ the patient...  LABORATORY DATA:  Reviewed in the EPIC EMR & discussed w/ the patient...   Assessment & Plan:    Hx of PNEUMONIA>  Hx RLL pneumonia resolved now, underlying COPD, hx recurrent bronchitis...  HBP>  Controlled on Diovan/HCT;  Continue same...  CARDIAC>  Hx CWP, AoV dis, Pacer;  Stable now & doing well w/o symptoms;  She will continue f/u in Pacer clinic...  LIPIDS>  On Simva40 + CoQ10;  FLP is at goals & continue same...  DM>  On diet alone;  Labs have beed good & A1c= 6.6.Marland KitchenMarland Kitchen  HYPOTHYROID>  Stable on the Synth125, continue same...  ORTHO>  She has DJD, LBP, leg pain> she continues f/u by DrRamos for shots prn, doesn't want pain meds, we reviewed her predicament...  Noct leg pain>  This seems like an additional prob & rec to try tonic water, mustard, & the homeopathic quinine prep that her son found for her to put under her tongue...  Other medical problems as noted...   Patient's Medications  New Prescriptions   GLUCOSE BLOOD (ONE TOUCH ULTRA TEST) TEST STRIP    Use as instructed  Previous Medications   ALENDRONATE (FOSAMAX) 70 MG TABLET    TAKE 1 TABLET EVERY 7 DAYS.TAKE WITH A FULL GLASS OF  WATER ON AN EMPTY STOMACH   ALPHA-LIPOIC ACID 200 MG CAPS    Take 1 capsule by mouth daily.     ASPIRIN 325 MG TABLET    Take 325 mg by mouth daily.     CALCIUM CARBONATE-VITAMIN D (CALTRATE 600+D) 600-400 MG-UNIT PER TABLET    Take 1 tablet by mouth daily.     CHOLECALCIFEROL (VITAMIN D) 1000 UNITS TABLET    Take 1,000 Units by mouth daily.     CO-ENZYME Q-10 30 MG CAPSULE    Take 30 mg by mouth daily.     CYANOCOBALAMIN (B-12) 2500 MCG TABS    Take 1 tablet by mouth every other day.    DOCUSATE SODIUM (COLACE) 100 MG CAPSULE    Take 100 mg by mouth at bedtime.    FISH OIL-OMEGA-3 FATTY ACIDS 1000 MG CAPSULE    Take 1 g by mouth daily.     HOMEOPATHIC PRODUCTS ([employer] LEG CRAMPS PAIN RELIEF PO)    3 tablets a day as needed for leg cramps   LEVOTHYROXINE  (SYNTHROID) 125 MCG TABLET    TAKE 1 TABLET DAILY   POLYETHYLENE GLYCOL (MIRALAX / GLYCOLAX) PACKET    Take 17 g by mouth daily as needed.    PREGABALIN (LYRICA) 75 MG CAPSULE    Take 1 capsule (75 mg total) by mouth at bedtime.   PROBIOTIC PRODUCT (PROBIOTIC FORMULA) CAPS    Take 1 capsule by mouth daily.     SIMVASTATIN (ZOCOR) 40 MG TABLET    Take 1 tablet (40 mg total) by mouth daily.   VALSARTAN-HYDROCHLOROTHIAZIDE (DIOVAN-HCT) 80-12.5 MG PER TABLET    TAKE 1 TABLET BY MOUTH DAILY  Modified Medications   No medications on file  Discontinued Medications   GLUCOSE BLOOD TEST STRIP    1 each by Other route as needed. Use as instructed    NITROFURANTOIN (MACRODANTIN) 100 MG CAPSULE    Take 1 capsule by mouth 2 (two) times daily.

## 2012-09-14 NOTE — Patient Instructions (Addendum)
Today we updated your med list in our EPIC system...    Continue your current medications the same...    We refilled the meds you requested...  Today we did a clean catch urinalysis 7 culture to see if there is any residual UTI...    Please return to our lab FASTING in the AM for your blood work...    We will contact you w/ the results when available...   We hope that DrRamos can find some relief for your back & leg pain...    Please let us know if you want to start a pain medication...  Call for any questions...  Let's plan a follow up visit in 97mo, sooner if needed for problems.Marland KitchenMarland Kitchen

## 2012-09-15 ENCOUNTER — Encounter: Payer: Self-pay | Admitting: Internal Medicine

## 2012-09-15 LAB — LIPID PANEL
Cholesterol: 170 mg/dL (ref 0–200)
LDL Cholesterol: 95 mg/dL (ref 0–99)
Total CHOL/HDL Ratio: 3
VLDL: 16.6 mg/dL (ref 0.0–40.0)

## 2012-09-15 LAB — HEMOGLOBIN A1C: Hgb A1c MFr Bld: 6.6 % — ABNORMAL HIGH (ref 4.6–6.5)

## 2012-09-15 LAB — HEPATIC FUNCTION PANEL
ALT: 20 U/L (ref 0–35)
AST: 19 U/L (ref 0–37)
Alkaline Phosphatase: 63 U/L (ref 39–117)
Bilirubin, Direct: 0.1 mg/dL (ref 0.0–0.3)
Total Bilirubin: 0.6 mg/dL (ref 0.3–1.2)

## 2012-09-15 LAB — BASIC METABOLIC PANEL
Chloride: 107 mEq/L (ref 96–112)
GFR: 74.12 mL/min (ref >60.00)
Potassium: 5.3 mEq/L — ABNORMAL HIGH (ref 3.5–5.1)
Sodium: 143 mEq/L (ref 135–145)

## 2012-09-15 LAB — TSH: TSH: 5.24 u[IU]/mL (ref 0.35–5.50)

## 2012-09-16 LAB — URINE CULTURE
Colony Count: NO GROWTH
Organism ID, Bacteria: NO GROWTH

## 2012-09-20 ENCOUNTER — Telehealth: Payer: Self-pay | Admitting: Pulmonary Disease

## 2012-09-20 DIAGNOSIS — G894 Chronic pain syndrome: Secondary | ICD-10-CM | POA: Diagnosis not present

## 2012-09-20 NOTE — Telephone Encounter (Signed)
Called and spoke with pt  And she is aware of lab results per SN.  Pt voiced her understanding and nothing further is needed.

## 2012-09-21 ENCOUNTER — Telehealth: Payer: Self-pay | Admitting: Pulmonary Disease

## 2012-09-21 NOTE — Telephone Encounter (Signed)
Last seen on 09/14/12. Last fill 02/25/12 with 3 additional refills  Please advise if it would be okay to refill this. Thanks.

## 2012-09-22 ENCOUNTER — Telehealth: Payer: Self-pay | Admitting: Pulmonary Disease

## 2012-09-22 MED ORDER — PREGABALIN 75 MG PO CAPS: 75.0000 mg | ORAL_CAPSULE | Freq: Every day | ORAL | Status: DC

## 2012-09-22 NOTE — Telephone Encounter (Signed)
Called and spoke with pt and she is aware of meds called to the local pharmacy at [name] on [address].  Pt requested that this be called in for a 90 day supply.  Pt is aware that this has been done and nothing further is needed.

## 2012-09-22 NOTE — Telephone Encounter (Signed)
Duplicate message.  This medication has already been called to the pharmacy.

## 2012-09-22 NOTE — Telephone Encounter (Signed)
Last seen on 09/14/12. Last fill 02/25/12 with 3 additional refills  Please advise if it would be okay to refill this. Thanks. 

## 2012-09-29 ENCOUNTER — Emergency Department (HOSPITAL_COMMUNITY): Payer: MEDICARE

## 2012-09-29 ENCOUNTER — Emergency Department (HOSPITAL_COMMUNITY)
Admission: EM | Admit: 2012-09-29 | Discharge: 2012-09-29 | Disposition: A | Discharge status: Home / Self Care | Payer: MEDICARE | Attending: Emergency Medicine | Admitting: Emergency Medicine

## 2012-09-29 ENCOUNTER — Encounter (HOSPITAL_COMMUNITY): Payer: Self-pay

## 2012-09-29 DIAGNOSIS — M1990 Unspecified osteoarthritis, unspecified site: Secondary | ICD-10-CM | POA: Diagnosis not present

## 2012-09-29 DIAGNOSIS — Z862 Personal history of diseases of the blood and blood-forming organs and certain disorders involving the immune mechanism: Secondary | ICD-10-CM | POA: Diagnosis not present

## 2012-09-29 DIAGNOSIS — Z79899 Other long term (current) drug therapy: Secondary | ICD-10-CM | POA: Insufficient documentation

## 2012-09-29 DIAGNOSIS — H9190 Unspecified hearing loss, unspecified ear: Secondary | ICD-10-CM | POA: Insufficient documentation

## 2012-09-29 DIAGNOSIS — K5900 Constipation, unspecified: Secondary | ICD-10-CM | POA: Diagnosis not present

## 2012-09-29 DIAGNOSIS — E785 Hyperlipidemia, unspecified: Secondary | ICD-10-CM | POA: Diagnosis not present

## 2012-09-29 DIAGNOSIS — R7309 Other abnormal glucose: Secondary | ICD-10-CM | POA: Diagnosis not present

## 2012-09-29 DIAGNOSIS — F411 Generalized anxiety disorder: Secondary | ICD-10-CM | POA: Insufficient documentation

## 2012-09-29 DIAGNOSIS — Z8669 Personal history of other diseases of the nervous system and sense organs: Secondary | ICD-10-CM | POA: Insufficient documentation

## 2012-09-29 DIAGNOSIS — R112 Nausea with vomiting, unspecified: Secondary | ICD-10-CM | POA: Insufficient documentation

## 2012-09-29 DIAGNOSIS — Z8739 Personal history of other diseases of the musculoskeletal system and connective tissue: Secondary | ICD-10-CM | POA: Insufficient documentation

## 2012-09-29 DIAGNOSIS — E871 Hypo-osmolality and hyponatremia: Secondary | ICD-10-CM | POA: Diagnosis not present

## 2012-09-29 DIAGNOSIS — R8271 Bacteriuria: Secondary | ICD-10-CM

## 2012-09-29 DIAGNOSIS — Z8709 Personal history of other diseases of the respiratory system: Secondary | ICD-10-CM | POA: Diagnosis not present

## 2012-09-29 DIAGNOSIS — Z8679 Personal history of other diseases of the circulatory system: Secondary | ICD-10-CM | POA: Insufficient documentation

## 2012-09-29 DIAGNOSIS — Z7982 Long term (current) use of aspirin: Secondary | ICD-10-CM | POA: Insufficient documentation

## 2012-09-29 DIAGNOSIS — K2970 Gastritis, unspecified, without bleeding: Secondary | ICD-10-CM | POA: Diagnosis not present

## 2012-09-29 DIAGNOSIS — Z8719 Personal history of other diseases of the digestive system: Secondary | ICD-10-CM | POA: Diagnosis not present

## 2012-09-29 DIAGNOSIS — Z8619 Personal history of other infectious and parasitic diseases: Secondary | ICD-10-CM | POA: Diagnosis not present

## 2012-09-29 DIAGNOSIS — E039 Hypothyroidism, unspecified: Secondary | ICD-10-CM | POA: Diagnosis not present

## 2012-09-29 DIAGNOSIS — R82998 Other abnormal findings in urine: Secondary | ICD-10-CM | POA: Insufficient documentation

## 2012-09-29 DIAGNOSIS — I498 Other specified cardiac arrhythmias: Secondary | ICD-10-CM | POA: Diagnosis not present

## 2012-09-29 DIAGNOSIS — K5649 Other impaction of intestine: Secondary | ICD-10-CM | POA: Insufficient documentation

## 2012-09-29 LAB — GLUCOSE, CAPILLARY: Glucose-Capillary: 129 mg/dL — ABNORMAL HIGH (ref 70–99)

## 2012-09-29 LAB — URINE MICROSCOPIC-ADD ON

## 2012-09-29 LAB — BASIC METABOLIC PANEL
Chloride: 91 mEq/L — ABNORMAL LOW (ref 96–112)
GFR calc Af Amer: >90 mL/min (ref >90)
Potassium: 3.9 mEq/L (ref 3.5–5.1)
Sodium: 125 mEq/L — ABNORMAL LOW (ref 135–145)

## 2012-09-29 LAB — URINALYSIS, ROUTINE W REFLEX MICROSCOPIC
Bilirubin Urine: NEGATIVE
Nitrite: NEGATIVE
Specific Gravity, Urine: 1.015 (ref 1.005–1.030)
Urobilinogen, UA: 0.2 mg/dL (ref 0.0–1.0)

## 2012-09-29 LAB — TROPONIN I: Troponin I: <0.3 ng/mL (ref <0.30)

## 2012-09-29 MED ORDER — POLYETHYLENE GLYCOL 3350 17 G PO PACK: 17.0000 g | PACK | Freq: Every day | ORAL | Status: DC

## 2012-09-29 MED ORDER — ONDANSETRON HCL 4 MG PO TABS: 4.0000 mg | ORAL_TABLET | Freq: Three times a day (TID) | ORAL | Status: DC | PRN

## 2012-09-29 MED ORDER — ONDANSETRON HCL 4 MG/2ML IJ SOLN
4.0000 mg | Freq: Once | INTRAMUSCULAR | Status: AC
  Administered 2012-09-29: 4 mg via INTRAVENOUS
  Filled 2012-09-29: qty 2

## 2012-09-29 MED ORDER — SODIUM CHLORIDE 0.9 % IV BOLUS (SEPSIS)
1000.0000 mL | Freq: Once | INTRAVENOUS | Status: AC
  Administered 2012-09-29: 1000 mL via INTRAVENOUS

## 2012-09-29 NOTE — ED Notes (Signed)
Patient transported to X-ray 

## 2012-09-29 NOTE — ED Notes (Signed)
Pt c/o nausea/vomiting and constipation since yesterday, states usually disimpacts herself for stool

## 2012-09-29 NOTE — ED Provider Notes (Signed)
History     CSN: [id number] Arrival date & time 09/29/12  1145 First MD Initiated Contact with Kayla Davis 09/29/12 1157     Chief Complaint  Kayla Davis presents with  . Nausea  . Emesis  . Constipation   Kayla Davis is a 79 y.o. female presenting with vomiting and constipation.  Emesis Constipation Associated symptoms: vomiting    79 year old with a history of constipation, diabetes mellitus presenting with nausea/vomiting and constipation. Kayla Davis states that Kayla Davis intermittently takes miralax for constipation and frequently also has to disimpact herself with stool. Kayla Davis states Kayla Davis has not been taking miralax over the last week and has felt like Kayla Davis has had infrequent stools every 3 days. Most recently on Monday, Kayla Davis had to pull some hard stool balls out. Since Monday, no bowel movement. Kayla Davis states yesterday Kayla Davis took some tramadol for her DM neuropathy. Around 4 hours later Kayla Davis started vomiting every hour for at least 5 hours. Nonbloody nonbilious. No coffee ground emesis. States Kayla Davis stopped eating food at that point and that Kayla Davis has not eaten anything since that time and only taken some sips of fluids due to nausea. Has not taken any medications due to nausea including BP meds.   No fevers/chills. Denies diarrhea. Denies recent sick contacts. Denies dysuria/hematuria/polyuria. Endorses long time urinary incontinence with no recent changes. Denies chest pain, shortness of breath, arm pain or neck pain or diaphoresis. Does express Kayla Davis is worried this could be her heart given Kayla Davis has a pacemaker. No abdominal pain. Kayla Davis is passing gas.   Past Medical History  Diagnosis Date  . Hypertension   . Hyperlipidemia   . Aortic valve disorders   . Unspecified hereditary and idiopathic peripheral neuropathy   . Other abnormal glucose   . Unspecified hearing loss   . Unspecified chronic bronchitis   . Unspecified hypothyroidism   . Unspecified gastritis and gastroduodenitis without mention of hemorrhage   .  Diverticulosis of colon (without mention of hemorrhage)   . Irritable bowel syndrome   . Osteoarthrosis, unspecified whether generalized or localized, unspecified site   . Backache, unspecified   . Pain in limb   . Osteoporosis, unspecified   . Herpes zoster with other nervous system complications(053.19)   . Anxiety state, unspecified   . Anemia, unspecified   . Bradycardia     s/p PPM    Past Surgical History  Procedure Laterality Date  . Pacemaker insertion      MDT  . Appendectomy    . Cholecystectomy    . Tonsillectomy    . Vaginal hysterectomy    . Cataract extraction, bilateral  end of June 2013  . Cataract extraction, bilateral  beginning of July 2013    Family History  Problem Relation Age of Onset  . Heart disease Father   . Cancer Paternal Grandmother   . Diabetes Father   . Heart disease Mother     History  Substance Use Topics  . Smoking status: Never Smoker   . Smokeless tobacco: Never Used  . Alcohol Use: No   Review of Systems  Gastrointestinal: Positive for vomiting and constipation.   Allergies  Alprazolam; Bactrim; Ceclor; Cephalexin; Clindamycin/lincomycin; Hydrocodone; Noroxin; Penicillins; Sulfonamide derivatives; and Tetracyclines & related  Home Medications   Current Outpatient Rx  Name  Route  Sig  Dispense  Refill  . alendronate (FOSAMAX) 70 MG tablet      TAKE 1 TABLET EVERY 7 DAYS.TAKE WITH A FULL GLASS OF  WATER ON AN EMPTY  STOMACH   12 tablet   3   . Alpha-Lipoic Acid 200 MG CAPS   Oral   Take 1 capsule by mouth daily.           [name] aspirin 325 MG tablet   Oral   Take 325 mg by mouth daily.           . Calcium Carbonate-Vitamin D (CALTRATE 600+D) 600-400 MG-UNIT per tablet   Oral   Take 1 tablet by mouth daily.           . cholecalciferol (VITAMIN D) 1000 UNITS tablet   Oral   Take 1,000 Units by mouth daily.           [name] co-enzyme Q-10 30 MG capsule   Oral   Take 30 mg by mouth daily.           .  Cyanocobalamin (B-12) 2500 MCG TABS   Oral   Take 1 tablet by mouth every other day.          . docusate sodium (COLACE) 100 MG capsule   Oral   Take 100 mg by mouth at bedtime.          . fish oil-omega-3 fatty acids 1000 MG capsule   Oral   Take 1 g by mouth daily.           [name] glucose blood (ONE TOUCH ULTRA TEST) test strip      Use as instructed   100 each   3   . Homeopathic Products ([facility] LEG CRAMPS PAIN RELIEF PO)      3 tablets a day as needed for leg cramps         . levothyroxine (SYNTHROID) 125 MCG tablet      TAKE 1 TABLET DAILY   90 tablet   3   . polyethylene glycol (MIRALAX / GLYCOLAX) packet   Oral   Take 17 g by mouth daily as needed.          . pregabalin (LYRICA) 75 MG capsule   Oral   Take 1 capsule (75 mg total) by mouth at bedtime.   90 capsule   3   . Probiotic Product (PROBIOTIC FORMULA) CAPS   Oral   Take 1 capsule by mouth daily.           . simvastatin (ZOCOR) 40 MG tablet   Oral   Take 1 tablet (40 mg total) by mouth daily.   90 tablet   4   . valsartan-hydrochlorothiazide (DIOVAN-HCT) 80-12.5 MG per tablet      TAKE 1 TABLET BY MOUTH DAILY   90 tablet   3     BP 149/61  Pulse 60  Temp(Src) 98.2 F (36.8 C) (Oral)  Resp 18  SpO2 96%  Physical Exam  Constitutional: Kayla Davis is oriented to person, place, and time. Kayla Davis appears well-developed and well-nourished. No distress.  elderly  HENT:  Head: Normocephalic and atraumatic.  Eyes: EOM are normal. Pupils are equal, round, and reactive to light.  Neck: Normal range of motion. Neck supple.  Cardiovascular: Normal rate and regular rhythm.  Exam reveals no gallop and no friction rub.   No murmur heard. Pulmonary/Chest: Effort normal and breath sounds normal. No respiratory distress. Kayla Davis has no wheezes. Kayla Davis has no rales.  Abdominal: Soft. Bowel sounds are normal. Kayla Davis exhibits no distension and no mass. There is no tenderness. There is no rebound and no guarding.   Genitourinary:  Rectum with good tone.  Multiple small to medium sized firm stool balls noted on rectum exam and extracted.   Musculoskeletal: Normal range of motion. Kayla Davis exhibits no edema.  Neurological: Kayla Davis is alert and oriented to person, place, and time.  Skin: Skin is warm and dry.    ED Course  Procedures (including critical care time)  Labs Reviewed  GLUCOSE, CAPILLARY - Abnormal; Notable for the following:    Glucose-Capillary 129 (*)    All other components within normal limits  BASIC METABOLIC PANEL - Abnormal; Notable for the following:    Sodium 125 (*)    Chloride 91 (*)    Glucose, Bld 122 (*)    GFR calc non Af Amer 81 (*)    All other components within normal limits  URINALYSIS, ROUTINE W REFLEX MICROSCOPIC - Abnormal; Notable for the following:    APPearance CLOUDY (*)    Leukocytes, UA SMALL (*)    All other components within normal limits  URINE MICROSCOPIC-ADD ON - Abnormal; Notable for the following:    Squamous Epithelial / LPF FEW (*)    Bacteria, UA MANY (*)    All other components within normal limits  URINE CULTURE  TROPONIN I   Dg Abd Acute W/chest  09/29/2012   *RADIOLOGY REPORT*  Clinical Data: Nausea and vomiting  ACUTE ABDOMEN SERIES (ABDOMEN 2 VIEW & CHEST 1 VIEW)  Comparison: Chest 12/25/2010  Findings: Cardiac enlargement with mild vascular congestion. Negative for edema.  Dual lead pacemaker is unchanged.  Negative for infiltrate effusion or mass lesion.  Negative for bowel obstruction or free air.  No dilated bowel loops.  There is a moderate amount of stool in the right and left colon.  Lumbar scoliosis and degenerative change.  No kidney stone  IMPRESSION: Cardiac enlargement with mild vascular congestion.  Nonobstructive bowel gas pattern.  The Kayla Davis is constipated.   Original Report Authenticated By: Janeece Riggers, M.D.  Please note that Kayla Davis had just received an IVF bolus.   1. Constipation   2. Impaction of colon   3. Hyponatremia    4. Asymptomatic bacteriuria     MDM  79 year old with a history of constipation, diabetes mellitus presenting with nausea/vomiting and constipation and found to have large stool burden without obstruction on her abdominal series. Disimpacted stool in colon before abdominal series.  Hyponatremia due to dehydration-given 1L fluid bolus. EKG nonischemic and troponin negative-concern for silent MI less likely given >12 hour symptoms.  UA with many bacteria and small leukocytes. Kayla Davis asymptomatic-sent for culture and to be treated if culture positive (may call pcp to discuss treatment as Kayla Davis asymptomatic and wide range of allergies). Kayla Davis to be discharged home with daily miralax and can take additional dose each day if no stool. Zofran given in ED and Kayla Davis tolerated PO trial. To be given zofran to use at home as well. Given indications for return for symptoms specifically regarding SBO.   Kayla Davis will need close follow up with PCP this week-repeat BMET, follow up constipation regimen, nausea.    Date: 09/29/2012  Rate: 65  Rhythm: atrial sensed ventricularly paced  QRS Axis: right  Intervals: qrs prolonged-but Kayla Davis is paced  ST/T Wave abnormalities: nonspecific T wave changes  Conduction Disutrbances:first-degree A-V block   Narrative Interpretation:   Old EKG Reviewed: none available     Shelva Majestic, MD 09/29/12 1510  Shelva Majestic, MD 09/29/12 1512   I performed a history and physical examination of  Kayla Davis and discussed  her management with Dr. Durene Cal. I agree with the history, physical, assessment, and plan of care, with the following exceptions: None I was present for the following procedures: None  Time Spent in Critical Care of the Kayla Davis: None  Time spent in discussions with the Kayla Davis and family: 5-10 minutes  Melaya Hoselton   Derwood Kaplan, MD 09/29/12 1621

## 2012-09-30 ENCOUNTER — Telehealth: Payer: Self-pay | Admitting: Pulmonary Disease

## 2012-09-30 NOTE — Telephone Encounter (Signed)
Called and spoke with edie and she is aware of appt with TP on Friday at 10;15.   Nothing further is needed.

## 2012-10-01 ENCOUNTER — Ambulatory Visit (INDEPENDENT_AMBULATORY_CARE_PROVIDER_SITE_OTHER): Payer: MEDICARE | Admitting: Adult Health

## 2012-10-01 ENCOUNTER — Other Ambulatory Visit (INDEPENDENT_AMBULATORY_CARE_PROVIDER_SITE_OTHER): Payer: MEDICARE

## 2012-10-01 ENCOUNTER — Encounter: Payer: Self-pay | Admitting: Adult Health

## 2012-10-01 VITALS — BP 128/62 | HR 67 | Temp 97.9°F | Ht 64.0 in | Wt 154.4 lb

## 2012-10-01 DIAGNOSIS — I10 Essential (primary) hypertension: Secondary | ICD-10-CM

## 2012-10-01 DIAGNOSIS — N390 Urinary tract infection, site not specified: Secondary | ICD-10-CM | POA: Diagnosis not present

## 2012-10-01 DIAGNOSIS — K5900 Constipation, unspecified: Secondary | ICD-10-CM | POA: Diagnosis not present

## 2012-10-01 LAB — URINE CULTURE

## 2012-10-01 LAB — BASIC METABOLIC PANEL
Chloride: 93 mEq/L — ABNORMAL LOW (ref 96–112)
Creatinine, Ser: 0.9 mg/dL (ref 0.4–1.2)

## 2012-10-01 MED ORDER — CIPROFLOXACIN HCL 250 MG PO TABS: 250.0000 mg | ORAL_TABLET | Freq: Two times a day (BID) | ORAL | Status: DC

## 2012-10-01 MED ORDER — VALSARTAN 80 MG PO TABS: 80.0000 mg | ORAL_TABLET | Freq: Every day | ORAL | Status: DC

## 2012-10-01 NOTE — Assessment & Plan Note (Signed)
Chronic constipation w/ nausea  ER w/up w/ KUB + constipation , no obstruction   Plan  Restart Diovan HCT  tomorrow. , hold if vomitting returns  Increase stool softner Twice daily   Continue on Miralax Twice daily  - hold if diarrhea develops.  Continue on Probiotic daily  Hold Fosamax x 1 month .  May use Ducolax suppository if still constipated As needed   Drink plenty water.  If still constipated , call back to office .  Please contact office for sooner follow up if symptoms do not improve or worsen or seek emergency care  follow up Dr. Kriste Basque  As planned .  Hold multivitamin .  If not improving , refer to GI for input.

## 2012-10-01 NOTE — Patient Instructions (Addendum)
Restart Diovan tomorrow. , hold if vomitting returns  Will change Diovan HCT to plain Diovan  Labs today .  Begin Cipro 250mg  Twice daily  For Urinary tract infection  Increase stool softner Twice daily   Continue on Miralax Twice daily  - hold if diarrhea develops.  Continue on Probiotic daily  Hold Fosamax x 1 month .  May use Ducolax suppository if still constipated As needed   Drink plenty water.  If still constipated , call back to office .  Please contact office for sooner follow up if symptoms do not improve or worsen or seek emergency care  follow up Dr. Kriste Basque in 4 weeks  Hold multivitamin .

## 2012-10-01 NOTE — Addendum Note (Signed)
Addended by: Boone Master E on: 10/01/2012 11:44 AM   Modules accepted: Orders

## 2012-10-01 NOTE — Assessment & Plan Note (Signed)
Urine cx returned from ER + e. Coli -sensitivities are pending.   Plan  Begin Cipro 250mg  Twice daily  For Urinary tract infection  Drink plenty water.  Please contact office for sooner follow up if symptoms do not improve or worsen or seek emergency care  follow up Dr. Kriste Basque  As planned .  Hold multivitamin .

## 2012-10-01 NOTE — Assessment & Plan Note (Addendum)
Controlled  rx held in ER due n/v   Plan  Restart Diovan  tomorrow. , hold if vomitting returns  Stop HCT due to hyponatremia , check bmet  Please contact office for sooner follow up if symptoms do not improve or worsen or seek emergency care

## 2012-10-01 NOTE — Progress Notes (Signed)
Subjective:    Patient ID: Kayla Davis, female    DOB: 1923-01-16, 79 y.o.   MRN: [id number]  HPI 79 y/o she has mult med problems including:  Recurrent bronchitic infections;  HBP;  Aortic valve dis;  Cardiac pacemaker;  Ven insuffic;  Hyperlipidemia;  DM;  Hypothyroid;  Divertics/ constip;  DJD/ LBP;  Osteopenia;  Hx TIA;  Periph neuropathy;  Anxiety;  Anemia; and hx Shingles...  ~  May 14, 2011:  35mo ROV & she has been under the care of Podiatrist DrPetrinitz for ?toe infection treated w/ Cipro & Clinda==> improved;  She had [facility] screen & brings report of:  1) mild bilat carotid stenoses;  2) Neg abd ao Sonar w/o aneurysm;  3) ABIs are wnl;  4) BMI is wnl at 24 (149#, 60.5" tall)...    Hx Bronchitis, Pneumonia> resolved, no resid infection, CXR 9/12 reviewed...    HBP> controlled on DiovanHCT 80-12.5; BP= 142/66 & she denies CP, palpit, dizzy, SOB, edema, etc...    Cardiac pacer> followed by DrAllred for presyncope, LBBB, and pauses; seen 10/12 & pacer functioning well, continue same meds...    Ven Insuffic> she had neg ven dopplers 11/12; has mod VI, chr leg symptoms==> stable...    Chol> on Simva40 & FishOil; she has stopped it on numerous occas to see if leg symptoms improved==> no better off this med; FLP 4/12 looked good on Simva40...    Hypothyroid> stable on Synthroid 154mcg/d...    DJD, LBP> she has mult somatic complaints; ?neurogenic claudication==> she has tried PT, water exercise, meds, ESI, etc...  ~  September 16, 2011:  35mo ROV & Kayla Davis is doing very well> she has a relative who is 77 y/o still sharp/ ambulatory/ & makes her own clothes!  Her CC is LBP and neuropathy symptoms> she's received shots from DrRamos w/ help & restarted LYRICA 75mg  Qhs per TP which really helps, she says;  Had UTI 4/13 treated & everything at baseline now...      We reviewed prob list, meds, xrays and labs> see below>> LABS 6/13:  FLP- at goals on Simva40;  Chems- ok w/ BS=86, A1c=6.5  CBC- ok x  Hg=11.1;  TSH=2.41;  BNP=111...  ~  March 16, 2012:  45mo ROV & Kayla Davis is about the same, notes that it is hard to walk due to her leg discomfort, back hurts, knees hurt, etc; she refuses PT & we will not force the issue given her 89 yrs... We reviewed the following medical problems during today's office visit >>     Hearing Loss> wears bilat hearing aides...    Hx Bronchitis> she has ZPak to keep on hand for prn use...    HBP> on DiovaHCT80-12.5; BP= 132/60 & she denies anginaCP, palpit, dizzy, ch in SOB, edema, etc...     Cards- CWP, AI, pacer> on ASA325; follwed by DrAllred & seen 11/13- stable, doing satis, pacer ok...    VenInsuffic> she knows to elim sodium, elev legs, wear support hose...    CHOL> on Simva40, FishOil, CoQ10; yearly FLP done 6/13 & all parameters at goals...    DM> on diet alone; yearly labs 6/13 were wnl- BS=86, A1c=6.5    Hypothy> on Synthroid125; yearly labs 6/13 confirmed euthyroid on this dose...    GI- Divertics, constip> on Miralax, Colace, Probiotics; she denies abd pain, n/v/d, blood seen...    DJD, LBP, Osteopenia> on Fosamax70/wk, calcium, MVI, VitD1000; last BMD 9/11 showed TScore -1.7 in right [facility]  Hx TIA, Neuropathy> on [id number], [id number], homeopathic leg cramp med; she denies cerebral ischemic symptoms...    Anxiety> she declines anxiolytic Rx...    Anemia> Hg follow up = 11.7 w/ normal serum Fe etc... We reviewed prob list, meds, xrays and labs> see below for updates >> she had the 2013 Flu vaccine 9/13... LABS 12/13:  Chems- wnl;  CBC- mild anemia w/ Hg=11.7, MCV=94, Fe=83 (24%sat)...   ~  September 14, 2012:  20mo ROV & Kayla Davis has been c/o LBP & getting ESI shots from DrRamos, has back brace & ambulates w/ a cane "I tough it out"... She also remains under the care of GYN- had pessary placed but she reports too uncomfortable & she took it out; she reports blood in urine treated w/ Ab & resolved=> we rechecked UA & C&S- clear, neg...     Hearing Loss> wears  bilat hearing aides...    Hx Bronchitis> she has ZPak to keep on hand for prn use...    HBP> on DiovaHCT80-12.5; BP= 138/62 & she denies anginaCP, palpit, dizzy, ch in SOB, edema, etc...     Cards- CWP, AI, pacer> on ASA325; followed by DrAllred & seen 2/14- stable, doing satis, pacer ok...    VenInsuffic> she knows to elim sodium, elev legs, wear support hose...    CHOL> on Simva40, FishOil, CoQ10; FLP 6/14 showed TChol 170, TG 83, HDL 59, LDL 95    DM> on diet alone; labs 6/14 were wnl- BS=101, A1c=6.6    Hypothy> on Synthroid125; labs 6/14 showed TSH= 5.24    GI- Divertics, constip> on Miralax, Colace, Probiotics; she denies abd pain, n/v/d, blood seen...    DJD, LBP, Osteopenia> on Fosamax70/wk, calcium, MVI, VitD1000; last BMD 9/11 showed TScore -1.7 in right [facility]    Hx TIA, Neuropathy> on ASA325, [id number], homeopathic leg cramp med; she denies cerebral ischemic symptoms...    Anxiety> she declines anxiolytic Rx...    Anemia> Hg follow up = 11.7 w/ normal serum Fe etc... We reviewed prob list, meds, xrays and labs> see below for updates >>  Urinalysis C&S> Urine is clear, C&S in no growth... LABS 6/14:  FLP- at goals on Simva40;  Chems- wnl w/ BS=101 A1c=6.6;  TSH=5.24... CBC was ordered, not done...   10/01/2012 ER follow up  Pt complains over the last week, that she's had increased constipation. Patient has had chronic constipation. Over the years. She frequently has to blow out hard stool balls. Patient had no bowel movement for 2 days. Vomitted x 4 times.  She went  to the emergency room on June 18. KUB was negative for obstruction, but showed constipation. Patient was felt to have some dehydration and was given IV fluids. Na was 125. EKG without any acute changes, and troponin was negative. Patient was given Zofran. And was instructed to begin MiraLAX twice daily. UCx returned today w/ e coli , sensitivities are pending.  Since ER , she is feeling better, had small BM yesterday.   No vomitting. But has mild nausea. Zofran is helping.  No urinary symptoms.          Problem List:  HEARING LOSS (ICD-389.9) - she has bilat hearing aides & Tomi Paddock has washed out her EACs...  Hx of BRONCHITIS, RECURRENT >> Hx Pneumonia >> ~  8/12: she had CAP in RLL treated w/ Avelox & slowly resolved back to baseline... ~  CXR 9/12 w/ sl pectus excavatum, mod thor spondylosis, mild cardiomeg, pacer, mild left base scarring, NAD.Marland KitchenMarland Kitchen  HYPERTENSION (ICD-401.9) - controlled on DIOVAN/Hct 80-12.5 daily...  ~  4/12:  BP=120/58 and tol med well... she denies HA, fatigue, visual changes, CP, palipit, ch in dyspnea, edema, etc... ~  1/13:  BP= 142/66 & she remains largely asymptomatic...  ~  6/13:  BP= 126/74 & feeling well w/o CP, palpit, dizzy, ch in SOB, edema; Creat=0.7, BNP=111. ~  6/14:  on DiovaHCT80-12.5; BP= 138/62 & she denies anginaCP, palpit, dizzy, ch in SOB, edema, etc.  CHEST WALL PAIN, HX OF (ICD-V15.89) - on ASA 325mg /d... AORTIC VALVE DISORDERS (ICD-424.1)- s/p cardiac work up in 2001... currently denies CP etc... ~  NuclearStressTest 3/01 showed impaired exerc capacity & ST depression but no ischemia or infarction on images and EF=72%... ~  2DEcho 10/06 showed no regional wall motion abn, EF= 70%, mild incr AV thickness w/ reduced excursion, mild AI, sl decr RV function. ~  2DEcho 5/11 showed norm LV size & function, EF=55-60%, mild AI w/ sl thickening of valve, mild MR, mod TR, grI DD...  PACER for Pre-syncope>  Hosp 10/11 for presyncope w/ LBBB & pauses ==> pacer placed & she is now stable, asymptomatic, & followed in the device clinic by DrAllred... ~  10/12: she had f/u DrAllred, doing well, normal pacer function; she was complaining of leg weakness she though due to statin rx & they held it for a month... ~  She maintains regular f/u w/ drAllred et al & pacer function is normal....  VENOUS INSUFFICIENCY (ICD-459.81) - she has mod VV, VI, & chr leg symptoms... good  arterial pulses distally, no ischemic symptoms. ~  11/12:  Venous Dopplers done for leg pain were neg for DVT, no superfic thrombus or incompetence either...  HYPERLIPIDEMIA (ICD-272.4) - on SIMVASTATIN 40mg /d & she has stopped it on several occas due to leg discomfort to see if this would help- it has never lead to improved leg symptoms!... also takes FISH OIL daily. ~  FLP 4/08 on Simvastatin 40mg /d showed TChol 171, TG 88, HDL 58, LDL 96... ~  FLP 6/09 showed TChol 220, TG 98, HDL 58, LDL 143... rec to restart her Simvast40. ~  10/10: not fasting at present & we discussed f/u FLP on ret in 2011 ~  FLP 4/11 showed TChol 176, TG 81, HDL 65, LDL 95 ~  FLP 4/12 showed TChol 189, TG 103, HDL 70, LDL 99 ~  FLP 6/13 on Simva40 showed TChol 161, TG 123, HDL 63, LDL 73 ~  FLP 6/14 on simva40 showed TChol 170, TG 83, HDL 59, LDL 95   DIABETES MELLITUS, BORDERLINE (ICD-790.29) - on diet alone...  ~  labs 4/08 w/ BS= 106... ~  labs 2/09 w/ BS= 170, HgA1c= 6.7.Marland KitchenMarland Kitchen rec to start Metformin 500mg /d but she declined, prefer diet... ~  labs 6/09 showed BS= 97, HgA1c= 6.6.Marland KitchenMarland Kitchen rec- continue diet Rx. ~  labs 3/10 showed BS= 129, A1c= 6.6 ~  labs 9/10 showed BS= 108 ~  labs 4/11 showed BS= 96 ~  Labs 4/12 showed BS= 89 ~  Labs 6/13 on diet alone showed BS= 86, A1c= 6.5 ~  Labs 6/14 on diet alone showed BS=101, A1c=6.6   HYPOTHYROIDISM (ICD-244.9) - on SYNTHROID 151mcg/d... ~  TSH 4/08 = 1.45... keep same dose. ~  TSH 2/09 = 0.17 on 137mcg/d dose... rec to decr to 148mcg/d... ~  TSH 6/09 = 6.20 on 136mcg/d dose... she wants ret to dose. ~  TSH 3/10 = 0.41 on 178mcg/d dose. ~  TSH 4/11 = 0.24  on 166mcg/d dose... She doesn't want to decr the dose. ~  TSH 4/12 = 0.83 on 119mcg/d dose ~  TSH 6/13 = 2.41 on 174mcg/d ~  Labs 6/14 on Synthroid125 showed TSH= 5.24  DIVERTICULOSIS OF COLON (ICD-562.10) & CONSTIPATION (ICD-564.00) - we discussed a regimen to prevent constipation & she uses MIRALAX Prn... ~   Flex Sig 6/96 by DrSam showed divertics, otherw normal left colon...  DEGENERATIVE JOINT DISEASE (ICD-715.90) - she takes Glucosamine/ Chondroitin... LOW BACK PAIN & ?Neurogenic claudication in legs> ~  she persisits w/ mult somatic complaints--- she states she has tried off Simvastatin, Lyrica, Fosamax & others but nothing helps and her symptoms persist... XRays showed mild DJD both hips & lower spine; & BMD 5/09 showed min osteopenia w/ TScores -0.0 in Spine & -1.4 in right FemNeck... she has tried Rx w/ Tramadol, Advil, Osteobiflex, heat, etc...  ~  eval by Ortho- DrRamos w/ LBP, multilevel DDD, spondylolisthesis L5-S1 & bilat leg pain> she's tried phys therapy, water exercises, meds (no benefit); epid steroid shots have helped some... ~  6/13:  Pt back on LYRICA 75mg  Qhs at her request for neuropathy & improved, she says...  OSTEOPOROSIS (ICD-733.00) - on ALENDRONATE 70mg /wk, calcium, vitamins... ~  BMD here 5/09 showed TScores of 0.00 in Spine, and -1.4 in the right Fem Neck ~  BMD here 9/11 showed TScores of +0.8 in Spine, and -1.7 in the right [facility]... rec to continue same med.  Hx of TRANSIENT ISCHEMIC ATTACK (ICD-435.9) - she remains on ASA and has not had any new cerebral ischemic symptoms...  PERIPHERAL NEUROPATHY (ICD-356.9) - c/o chronic leg discomfort as noted... extensive work up in the past- DrStuckey 2001 w/ normal art dopplers; DrLove in 2001 w/ a neg NCV study;  DrAplington w/ meds and TENS trial (per her request- it helped her brother)... now DrRamos w/ PT, water exercises, and epid shots... tried Lyrica and now she thinks it's helping...  ANXIETY (ICD-300.00) - she declines anxiolytic Rx.  SHINGLES (ICD-053.9) - developed shingles Mar09 involving right sacral dermatomes w/ rash and pain in the perineal and buttock areas... treated via ER & several visits w/ NP w/ Valtrex, Pred, Lyrica, Vicodin... lesions resolved, some persist post-herpetic neuralgia...  ANEMIA, MILD  (ICD-285.9) ~  labs 9/09 showed Hg= 11.8.[name]. ~  labs 3/10 showed Hg= 11.6, Fe= 64 (sat=20%) ~  labs 4/11 showed = Hg= 11.4 ~  Labs 4/12 showed Hg= 11.9 ~  Labs 6/13 showed Hg= 11.1, MCV=95  HEALTH MAINTENANCE:  she had the 2010 Flu shot 9/10... PNEUMOVAX in 2006 age age 45...   Past Surgical History  Procedure Laterality Date  . Pacemaker insertion      MDT  . Appendectomy    . Cholecystectomy    . Tonsillectomy    . Vaginal hysterectomy    . Cataract extraction, bilateral  end of June 2013  . Cataract extraction, bilateral  beginning of July 2013    Outpatient Encounter Prescriptions as of 10/01/2012  Medication Sig Dispense Refill  . alendronate (FOSAMAX) 70 MG tablet TAKE 1 TABLET EVERY 7 DAYS.TAKE WITH A FULL GLASS OF  WATER ON AN EMPTY STOMACH  12 tablet  3  . Alpha-Lipoic Acid 200 MG CAPS Take 1 capsule by mouth daily.        [name] aspirin 325 MG tablet Take 325 mg by mouth daily.        . Calcium Carbonate-Vitamin D (CALTRATE 600+D) 600-400 MG-UNIT per tablet Take 1 tablet by mouth daily.        [name]  cholecalciferol (VITAMIN D) 1000 UNITS tablet Take 1,000 Units by mouth daily.        [name] co-enzyme Q-10 30 MG capsule Take 30 mg by mouth daily.        . Cyanocobalamin (B-12) 2500 MCG TABS Take 1 tablet by mouth every other day.       . docusate sodium (COLACE) 100 MG capsule Take 100 mg by mouth at bedtime.       . fish oil-omega-3 fatty acids 1000 MG capsule Take 1 g by mouth daily.        [name] glucose blood (ONE TOUCH ULTRA TEST) test strip Use as instructed  100 each  3  . Homeopathic Products ([employer] LEG CRAMPS PAIN RELIEF PO) 3 tablets a day as needed for leg cramps      . levothyroxine (SYNTHROID) 125 MCG tablet TAKE 1 TABLET DAILY  90 tablet  3  . ondansetron (ZOFRAN) 4 MG tablet Take 1 tablet (4 mg total) by mouth every 8 (eight) hours as needed for nausea.  30 tablet  0  . polyethylene glycol (MIRALAX / GLYCOLAX) packet Take 17 g by mouth daily. Make take doses twice a day if no  bowel movement  14 each  1  . pregabalin (LYRICA) 75 MG capsule Take 1 capsule (75 mg total) by mouth at bedtime.  90 capsule  3  . Probiotic Product (PROBIOTIC FORMULA) CAPS Take 1 capsule by mouth daily.        . simvastatin (ZOCOR) 40 MG tablet Take 1 tablet (40 mg total) by mouth daily.  90 tablet  4  . traMADol (ULTRAM) 50 MG tablet Take 1 tablet by mouth every 6 (six) hours as needed.      . ciprofloxacin (CIPRO) 250 MG tablet Take 1 tablet (250 mg total) by mouth 2 (two) times daily.  14 tablet  0   No facility-administered encounter medications on file as of 10/01/2012.    Allergies  Allergen Reactions  . Alprazolam     REACTION: eyes swell up  . Bactrim (Sulfamethoxazole W-Trimethoprim)     Unable to remember the side effects  . Ceclor (Cefaclor)     Unsure of the side effects  . Cephalexin     Unsure of the side effects  . Clindamycin/Lincomycin     Upset stomach  . Hydrocodone     Unsure of reaction to med  . Noroxin (Norfloxacin)     Unsure of side effects  . Penicillins     REACTION: swells up all over body, pt states she almost died  . Sulfonamide Derivatives     REACTION: itching  . Tetracyclines & Related     Unsure of the reaction to this medication    Current Medications, Allergies, Past Medical History, Past Surgical History, Family History, and Social History were reviewed in [employer] record.    Review of Systems         See HPI - all other systems neg except as noted... The patient complains of decreased hearing, dyspnea on exertion, and difficulty walking.  The patient denies anorexia, fever, weight loss, weight gain, vision loss, hoarseness, chest pain, syncope, peripheral edema, prolonged cough, headaches, hemoptysis, abdominal pain, melena, hematochezia, severe indigestion/heartburn, hematuria, incontinence, muscle weakness, suspicious skin lesions, transient blindness, depression, unusual weight change, abnormal bleeding,  enlarged lymph nodes, and angioedema.    Objective:   Physical Exam      WD, WN, 79 y/o WF in NAD;  VS- reviewed.  GENERAL:  Alert & oriented; pleasant & cooperative... HEENT:  [place]/AT, EACs-clear (she has bilat hearing aides), TMs-wnl, NOSE-clear, THROAT-clear & wnl. NECK:  Supple w/ fair ROM; no JVD; normal carotid impulses w/o bruits; no thyromegaly or nodules palpated; no lymphadenopathy. CHEST:  Clear to P & A; without wheezes/ rales/ or rhonchi heard; pacer in right upper shoulder area. HEART:  Regular gr 2/6 SEM S4 without rubs... ABDOMEN:  Soft & nontender; normal bowel sounds; no organomegaly or masses detected. No guarding or rebound.  EXT: without deformities, mild arthritic changes; +varicose veins/ +venous insuffic/ no edema. NEURO:   no focal neuro deficits x decr sensation in LE's c/w periph neuropathy.  DERM:  No lesions noted; no rash etc...    Assessment & Plan:

## 2012-10-02 NOTE — Progress Notes (Signed)
ED Antimicrobial Stewardship Positive Culture Follow Up   Kayla Davis is an 79 y.o. female who presented to [facility] on 09/29/2012 with a chief complaint of  Chief Complaint  Patient presents with  . Nausea  . Emesis  . Constipation    Recent Results (from the past 720 hour(s))  URINE CULTURE     Status: None   Collection Time    09/14/12  2:57 PM      Result Value Range Status   Colony Count NO GROWTH   Final   Organism ID, Bacteria NO GROWTH   Final  URINE CULTURE     Status: None   Collection Time    09/29/12 12:57 PM      Result Value Range Status   Specimen Description URINE, CLEAN CATCH   Final   Special Requests NONE   Final   Culture  Setup Time 09/29/2012 23:22   Final   Colony Count >=100,000 COLONIES/ML   Final   Culture ESCHERICHIA COLI   Final   Report Status 10/01/2012 FINAL   Final   Organism ID, Bacteria ESCHERICHIA COLI   Final     [x]  Patient discharged originally without antimicrobial agent and treatment is now indicated   Patient with numerous antibiotic allergies and can not use nitrofurantoin due to advanced age  New antibiotic prescription: fosfomycin 3g PO x 1  ED Provider: Arthor Captain, PA-C   Laurence Slate 10/02/2012, 1:15 PM Infectious Diseases Pharmacist Phone# [phone]

## 2012-10-03 ENCOUNTER — Telehealth (HOSPITAL_COMMUNITY): Payer: Self-pay | Admitting: Emergency Medicine

## 2012-10-03 NOTE — ED Notes (Signed)
LVM requesting call back.

## 2012-10-03 NOTE — ED Notes (Signed)
Pt returned call.  Pt informed of dx and need for addl tx.  Pt stated saw PCP Kriste Basque and prescribed Cipro has taken 2 1/2 day is having some stomach upset.  Pt informed ED provider wanted to call in different abx for her.  Pt would like to have abx call ed to [facility] and she will f/u w/Dr Kriste Basque regarding how she is feeling.  Spoke to [place] pharmacist and informed her pt taking Cipro her recommendation is for pt to take w/food or can take prescribed Fosfomycin 3 g po 1 x dose prescribed by ED.

## 2012-10-05 ENCOUNTER — Telehealth: Payer: Self-pay | Admitting: Pulmonary Disease

## 2012-10-05 DIAGNOSIS — E871 Hypo-osmolality and hyponatremia: Secondary | ICD-10-CM

## 2012-10-05 NOTE — Telephone Encounter (Signed)
Called and spoke with pt and she is aware of lab results per TP.  Pt voiced her understanding of these results and will return next week for recheck.  Pt voiced her understanding.  See results note.

## 2012-10-11 ENCOUNTER — Other Ambulatory Visit (INDEPENDENT_AMBULATORY_CARE_PROVIDER_SITE_OTHER): Payer: MEDICARE

## 2012-10-11 ENCOUNTER — Telehealth: Payer: Self-pay | Admitting: Pulmonary Disease

## 2012-10-11 DIAGNOSIS — E871 Hypo-osmolality and hyponatremia: Secondary | ICD-10-CM | POA: Diagnosis not present

## 2012-10-11 DIAGNOSIS — N390 Urinary tract infection, site not specified: Secondary | ICD-10-CM

## 2012-10-11 LAB — BASIC METABOLIC PANEL
GFR: 69.96 mL/min (ref >60.00)
Glucose, Bld: 112 mg/dL — ABNORMAL HIGH (ref 70–99)
Potassium: 4.9 mEq/L (ref 3.5–5.1)
Sodium: 139 mEq/L (ref 135–145)

## 2012-10-11 LAB — URINALYSIS
Ketones, ur: NEGATIVE
Leukocytes, UA: NEGATIVE
Nitrite: NEGATIVE
Specific Gravity, Urine: 1.01 (ref 1.000–1.030)
pH: 6.5 (ref 5.0–8.0)

## 2012-10-11 NOTE — Telephone Encounter (Signed)
I spoke with pt and she is requesting to have UA done as well. She stated she has been on ABX for UTI. Please advise SN thanks Last OV 6/20 Pending 03/22/13

## 2012-10-11 NOTE — Telephone Encounter (Signed)
Order for UA and urine culture has been placed per pts request. There is a pending BMP in the computer for the pt  But the pt has had full labs back on 09/14/2012

## 2012-10-11 NOTE — Telephone Encounter (Signed)
Called spoke with patient, she is aware of pending labs Called the lab to be sure the bmet is collected as well to follow up on sodium level Pt aware she will be contacted regarding results or she may call the office Nothing further needed at this time; will sign off

## 2012-10-12 LAB — URINE CULTURE: Colony Count: 30000

## 2012-10-13 ENCOUNTER — Telehealth: Payer: Self-pay | Admitting: Pulmonary Disease

## 2012-10-13 NOTE — Telephone Encounter (Signed)
Notes Recorded by Michele Mcalpine, MD on 10/12/2012 at 8:55 AM Please notify patient>  Chems- look good & UA is now clear s/p Cipro Rx...  I spoke with patient about results and she verbalized understanding and had no questions

## 2012-10-19 DIAGNOSIS — M545 Low back pain, unspecified: Secondary | ICD-10-CM | POA: Diagnosis not present

## 2012-10-19 DIAGNOSIS — G894 Chronic pain syndrome: Secondary | ICD-10-CM | POA: Diagnosis not present

## 2012-10-25 ENCOUNTER — Telehealth: Payer: Self-pay | Admitting: Pulmonary Disease

## 2012-10-25 NOTE — Telephone Encounter (Signed)
I spoke with the Kayla Davis and she wanted to know should she continue the diovan that was prescribed and last OV. I advised she should continue until her f/u which per instructions was for 4 weeks. Kayla Davis did not realize she needed a f/u so this has been set for 11-02-12. [name], CMA

## 2012-11-01 ENCOUNTER — Other Ambulatory Visit (INDEPENDENT_AMBULATORY_CARE_PROVIDER_SITE_OTHER): Payer: MEDICARE

## 2012-11-01 ENCOUNTER — Other Ambulatory Visit: Payer: Self-pay | Admitting: Pulmonary Disease

## 2012-11-01 DIAGNOSIS — D649 Anemia, unspecified: Secondary | ICD-10-CM

## 2012-11-02 ENCOUNTER — Telehealth: Payer: Self-pay | Admitting: Adult Health

## 2012-11-02 ENCOUNTER — Encounter: Payer: Self-pay | Admitting: Adult Health

## 2012-11-02 ENCOUNTER — Ambulatory Visit (INDEPENDENT_AMBULATORY_CARE_PROVIDER_SITE_OTHER): Payer: MEDICARE | Admitting: Adult Health

## 2012-11-02 VITALS — BP 148/68 | HR 59 | Temp 97.6°F | Ht 64.0 in | Wt 153.0 lb

## 2012-11-02 DIAGNOSIS — K2970 Gastritis, unspecified, without bleeding: Secondary | ICD-10-CM

## 2012-11-02 DIAGNOSIS — K2990 Gastroduodenitis, unspecified, without bleeding: Secondary | ICD-10-CM

## 2012-11-02 DIAGNOSIS — I10 Essential (primary) hypertension: Secondary | ICD-10-CM

## 2012-11-02 DIAGNOSIS — R7309 Other abnormal glucose: Secondary | ICD-10-CM

## 2012-11-02 DIAGNOSIS — E871 Hypo-osmolality and hyponatremia: Secondary | ICD-10-CM | POA: Diagnosis not present

## 2012-11-02 DIAGNOSIS — K589 Irritable bowel syndrome without diarrhea: Secondary | ICD-10-CM

## 2012-11-02 LAB — HEMOCCULT SLIDES (X 3 CARDS): Fecal Occult Blood: NEGATIVE

## 2012-11-02 MED ORDER — LOSARTAN POTASSIUM 100 MG PO TABS: 100.0000 mg | ORAL_TABLET | Freq: Every day | ORAL | Status: DC

## 2012-11-02 MED ORDER — VALSARTAN 160 MG PO TABS: 160.0000 mg | ORAL_TABLET | Freq: Every day | ORAL | Status: DC

## 2012-11-02 MED ORDER — ONETOUCH ULTRA SYSTEM W/DEVICE KIT: PACK | Status: DC

## 2012-11-02 MED ORDER — GLUCOSE BLOOD VI STRP: ORAL_STRIP | Status: DC

## 2012-11-02 NOTE — Patient Instructions (Addendum)
Increase Diovan 160mg  daily  Remain on stool softner daily .  Continue on Miralax  daily -hold if diarrhea.  Continue on Probiotic daily  Please contact office for sooner follow up if symptoms do not improve or worsen or seek emergency care  Follow up Dr. Kriste Basque as planned and As needed

## 2012-11-02 NOTE — Assessment & Plan Note (Signed)
Resolved

## 2012-11-02 NOTE — Assessment & Plan Note (Signed)
Not at goal increase diovan 160mg  daily

## 2012-11-02 NOTE — Progress Notes (Signed)
Subjective:    Patient ID: Kayla Davis, female    DOB: 11-Dec-1922, 79 y.o.   MRN: [id number]  HPI 79 y/o she has mult med problems including:  Recurrent bronchitic infections;  HBP;  Aortic valve dis;  Cardiac pacemaker;  Ven insuffic;  Hyperlipidemia;  DM;  Hypothyroid;  Divertics/ constip;  DJD/ LBP;  Osteopenia;  Hx TIA;  Periph neuropathy;  Anxiety;  Anemia; and hx Shingles...  ~  May 14, 2011:  13mo ROV & she has been under the care of Podiatrist DrPetrinitz for ?toe infection treated w/ Cipro & Clinda==> improved;  She had [facility] screen & brings report of:  1) mild bilat carotid stenoses;  2) Neg abd ao Sonar w/o aneurysm;  3) ABIs are wnl;  4) BMI is wnl at 24 (149#, 60.5" tall)...    Hx Bronchitis, Pneumonia> resolved, no resid infection, CXR 9/12 reviewed...    HBP> controlled on DiovanHCT 80-12.5; BP= 142/66 & she denies CP, palpit, dizzy, SOB, edema, etc...    Cardiac pacer> followed by DrAllred for presyncope, LBBB, and pauses; seen 10/12 & pacer functioning well, continue same meds...    Ven Insuffic> she had neg ven dopplers 11/12; has mod VI, chr leg symptoms==> stable...    Chol> on Simva40 & FishOil; she has stopped it on numerous occas to see if leg symptoms improved==> no better off this med; FLP 4/12 looked good on Simva40...    Hypothyroid> stable on Synthroid 181mcg/d...    DJD, LBP> she has mult somatic complaints; ?neurogenic claudication==> she has tried PT, water exercise, meds, ESI, etc...  ~  September 16, 2011:  13mo ROV & Kayla Davis is doing very well> she has a relative who is 77 y/o still sharp/ ambulatory/ & makes her own clothes!  Her CC is LBP and neuropathy symptoms> she's received shots from DrRamos w/ help & restarted LYRICA 75mg  Qhs per TP which really helps, she says;  Had UTI 4/13 treated & everything at baseline now...      We reviewed prob list, meds, xrays and labs> see below>> LABS 6/13:  FLP- at goals on Simva40;  Chems- ok w/ BS=86, A1c=6.5  CBC- ok x  Hg=11.1;  TSH=2.41;  BNP=111...  ~  March 16, 2012:  82mo ROV & Kayla Davis is about the same, notes that it is hard to walk due to her leg discomfort, back hurts, knees hurt, etc; she refuses PT & we will not force the issue given her 89 yrs... We reviewed the following medical problems during today's office visit >>     Hearing Loss> wears bilat hearing aides...    Hx Bronchitis> she has ZPak to keep on hand for prn use...    HBP> on DiovaHCT80-12.5; BP= 132/60 & she denies anginaCP, palpit, dizzy, ch in SOB, edema, etc...     Cards- CWP, AI, pacer> on ASA325; follwed by DrAllred & seen 11/13- stable, doing satis, pacer ok...    VenInsuffic> she knows to elim sodium, elev legs, wear support hose...    CHOL> on Simva40, FishOil, CoQ10; yearly FLP done 6/13 & all parameters at goals...    DM> on diet alone; yearly labs 6/13 were wnl- BS=86, A1c=6.5    Hypothy> on Synthroid125; yearly labs 6/13 confirmed euthyroid on this dose...    GI- Divertics, constip> on Miralax, Colace, Probiotics; she denies abd pain, n/v/d, blood seen...    DJD, LBP, Osteopenia> on Fosamax70/wk, calcium, MVI, VitD1000; last BMD 9/11 showed TScore -1.7 in right [facility]  Hx TIA, Neuropathy> on [id number], [id number], homeopathic leg cramp med; she denies cerebral ischemic symptoms...    Anxiety> she declines anxiolytic Rx...    Anemia> Hg follow up = 11.7 w/ normal serum Fe etc... We reviewed prob list, meds, xrays and labs> see below for updates >> she had the 2013 Flu vaccine 9/13... LABS 12/13:  Chems- wnl;  CBC- mild anemia w/ Hg=11.7, MCV=94, Fe=83 (24%sat)...   ~  September 14, 2012:  77mo ROV & Kayla Davis has been c/o LBP & getting ESI shots from DrRamos, has back brace & ambulates w/ a cane "I tough it out"... She also remains under the care of GYN- had pessary placed but she reports too uncomfortable & she took it out; she reports blood in urine treated w/ Ab & resolved=> we rechecked UA & C&S- clear, neg...     Hearing Loss> wears  bilat hearing aides...    Hx Bronchitis> she has ZPak to keep on hand for prn use...    HBP> on DiovaHCT80-12.5; BP= 138/62 & she denies anginaCP, palpit, dizzy, ch in SOB, edema, etc...     Cards- CWP, AI, pacer> on ASA325; followed by DrAllred & seen 2/14- stable, doing satis, pacer ok...    VenInsuffic> she knows to elim sodium, elev legs, wear support hose...    CHOL> on Simva40, FishOil, CoQ10; FLP 6/14 showed TChol 170, TG 83, HDL 59, LDL 95    DM> on diet alone; labs 6/14 were wnl- BS=101, A1c=6.6    Hypothy> on Synthroid125; labs 6/14 showed TSH= 5.24    GI- Divertics, constip> on Miralax, Colace, Probiotics; she denies abd pain, n/v/d, blood seen...    DJD, LBP, Osteopenia> on Fosamax70/wk, calcium, MVI, VitD1000; last BMD 9/11 showed TScore -1.7 in right [facility]    Hx TIA, Neuropathy> on ASA325, [id number], homeopathic leg cramp med; she denies cerebral ischemic symptoms...    Anxiety> she declines anxiolytic Rx...    Anemia> Hg follow up = 11.7 w/ normal serum Fe etc... We reviewed prob list, meds, xrays and labs> see below for updates >>  Urinalysis C&S> Urine is clear, C&S in no growth... LABS 6/14:  FLP- at goals on Simva40;  Chems- wnl w/ BS=101 A1c=6.6;  TSH=5.24... CBC was ordered, not done...   10/01/12  ER follow up  Pt complains over the last week, that she's had increased constipation. Patient has had chronic constipation. Over the years. She frequently has to blow out hard stool balls. Patient had no bowel movement for 2 days. Vomitted x 4 times.  She went  to the emergency room on June 18. KUB was negative for obstruction, but showed constipation. Patient was felt to have some dehydration and was given IV fluids. Na was 125. EKG without any acute changes, and troponin was negative. Patient was given Zofran. And was instructed to begin MiraLAX twice daily. UCx returned today w/ e coli , sensitivities are pending.  Since ER , she is feeling better, had small BM yesterday.   No vomitting. But has mild nausea. Zofran is helping.  No urinary symptoms.  >>cipro for UTI , and HCTZ stopped   11/02/2012 Follow up  Pt returns for follow up  4 week follow up - reports constipation is much improved, and is taking 1/2 dose of miralax daily.  would like to discuss Diovan vs Diovan-HCT.  Seen last ov after UTI and gastritis with n/v and hyponatremia. She was treated with Cipro for 7 days  UTI symptoms are gone and repeat  UA was clear.  HCTZ was held due to weakness , n/v and hyponatremia.  NA repeated w/ was back to normal.  N/v has resolved and appetite is normal  She had constipation that improved with stool softner and miralax. Has cut back on niralax to avoid loose stools.  No fever, hemoptysis, abd pain, chest pain or bloody stools.            Problem List:  HEARING LOSS (ICD-389.9) - she has bilat hearing aides & Kion Huntsberry has washed out her EACs...  Hx of BRONCHITIS, RECURRENT >> Hx Pneumonia >> ~  8/12: she had CAP in RLL treated w/ Avelox & slowly resolved back to baseline... ~  CXR 9/12 w/ sl pectus excavatum, mod thor spondylosis, mild cardiomeg, pacer, mild left base scarring, NAD...  HYPERTENSION (ICD-401.9) - controlled on DIOVAN/Hct 80-12.5 daily...  ~  4/12:  BP=120/58 and tol med well... she denies HA, fatigue, visual changes, CP, palipit, ch in dyspnea, edema, etc... ~  1/13:  BP= 142/66 & she remains largely asymptomatic...  ~  6/13:  BP= 126/74 & feeling well w/o CP, palpit, dizzy, ch in SOB, edema; Creat=0.7, BNP=111. ~  6/14:  on DiovaHCT80-12.5; BP= 138/62 & she denies anginaCP, palpit, dizzy, ch in SOB, edema, etc.  CHEST WALL PAIN, HX OF (ICD-V15.89) - on ASA 325mg /d... AORTIC VALVE DISORDERS (ICD-424.1)- s/p cardiac work up in 2001... currently denies CP etc... ~  NuclearStressTest 3/01 showed impaired exerc capacity & ST depression but no ischemia or infarction on images and EF=72%... ~  2DEcho 10/06 showed no regional wall motion abn,  EF= 70%, mild incr AV thickness w/ reduced excursion, mild AI, sl decr RV function. ~  2DEcho 5/11 showed norm LV size & function, EF=55-60%, mild AI w/ sl thickening of valve, mild MR, mod TR, grI DD...  PACER for Pre-syncope>  Hosp 10/11 for presyncope w/ LBBB & pauses ==> pacer placed & she is now stable, asymptomatic, & followed in the device clinic by DrAllred... ~  10/12: she had f/u DrAllred, doing well, normal pacer function; she was complaining of leg weakness she though due to statin rx & they held it for a month... ~  She maintains regular f/u w/ drAllred et al & pacer function is normal....  VENOUS INSUFFICIENCY (ICD-459.81) - she has mod VV, VI, & chr leg symptoms... good arterial pulses distally, no ischemic symptoms. ~  11/12:  Venous Dopplers done for leg pain were neg for DVT, no superfic thrombus or incompetence either...  HYPERLIPIDEMIA (ICD-272.4) - on SIMVASTATIN 40mg /d & she has stopped it on several occas due to leg discomfort to see if this would help- it has never lead to improved leg symptoms!... also takes FISH OIL daily. ~  FLP 4/08 on Simvastatin 40mg /d showed TChol 171, TG 88, HDL 58, LDL 96... ~  FLP 6/09 showed TChol 220, TG 98, HDL 58, LDL 143... rec to restart her Simvast40. ~  10/10: not fasting at present & we discussed f/u FLP on ret in 2011 ~  FLP 4/11 showed TChol 176, TG 81, HDL 65, LDL 95 ~  FLP 4/12 showed TChol 189, TG 103, HDL 70, LDL 99 ~  FLP 6/13 on Simva40 showed TChol 161, TG 123, HDL 63, LDL 73 ~  FLP 6/14 on simva40 showed TChol 170, TG 83, HDL 59, LDL 95   DIABETES MELLITUS, BORDERLINE (ICD-790.29) - on diet alone...  ~  labs 4/08 w/ BS= 106... ~  labs 2/09 w/ BS= 170, HgA1c=  6.7... rec to start Metformin 500mg /d but she declined, prefer diet... ~  labs 6/09 showed BS= 97, HgA1c= 6.6.Marland KitchenMarland Kitchen rec- continue diet Rx. ~  labs 3/10 showed BS= 129, A1c= 6.6 ~  labs 9/10 showed BS= 108 ~  labs 4/11 showed BS= 96 ~  Labs 4/12 showed BS= 89 ~  Labs  6/13 on diet alone showed BS= 86, A1c= 6.5 ~  Labs 6/14 on diet alone showed BS=101, A1c=6.6   HYPOTHYROIDISM (ICD-244.9) - on SYNTHROID 166mcg/d... ~  TSH 4/08 = 1.45... keep same dose. ~  TSH 2/09 = 0.17 on 14mcg/d dose... rec to decr to 151mcg/d... ~  TSH 6/09 = 6.20 on 184mcg/d dose... she wants ret to dose. ~  TSH 3/10 = 0.41 on 145mcg/d dose. ~  TSH 4/11 = 0.24 on 113mcg/d dose... She doesn't want to decr the dose. ~  TSH 4/12 = 0.83 on 187mcg/d dose ~  TSH 6/13 = 2.41 on 155mcg/d ~  Labs 6/14 on Synthroid125 showed TSH= 5.24  DIVERTICULOSIS OF COLON (ICD-562.10) & CONSTIPATION (ICD-564.00) - we discussed a regimen to prevent constipation & she uses MIRALAX Prn... ~  Flex Sig 6/96 by DrSam showed divertics, otherw normal left colon...  DEGENERATIVE JOINT DISEASE (ICD-715.90) - she takes Glucosamine/ Chondroitin... LOW BACK PAIN & ?Neurogenic claudication in legs> ~  she persisits w/ mult somatic complaints--- she states she has tried off Simvastatin, Lyrica, Fosamax & others but nothing helps and her symptoms persist... XRays showed mild DJD both hips & lower spine; & BMD 5/09 showed min osteopenia w/ TScores -0.0 in Spine & -1.4 in right FemNeck... she has tried Rx w/ Tramadol, Advil, Osteobiflex, heat, etc...  ~  eval by Ortho- DrRamos w/ LBP, multilevel DDD, spondylolisthesis L5-S1 & bilat leg pain> she's tried phys therapy, water exercises, meds (no benefit); epid steroid shots have helped some... ~  6/13:  Pt back on LYRICA 75mg  Qhs at her request for neuropathy & improved, she says...  OSTEOPOROSIS (ICD-733.00) - on ALENDRONATE 70mg /wk, calcium, vitamins... ~  BMD here 5/09 showed TScores of 0.00 in Spine, and -1.4 in the right Fem Neck ~  BMD here 9/11 showed TScores of +0.8 in Spine, and -1.7 in the right [facility]... rec to continue same med.  Hx of TRANSIENT ISCHEMIC ATTACK (ICD-435.9) - she remains on ASA and has not had any new cerebral ischemic  symptoms...  PERIPHERAL NEUROPATHY (ICD-356.9) - c/o chronic leg discomfort as noted... extensive work up in the past- DrStuckey 2001 w/ normal art dopplers; DrLove in 2001 w/ a neg NCV study;  DrAplington w/ meds and TENS trial (per her request- it helped her brother)... now DrRamos w/ PT, water exercises, and epid shots... tried Lyrica and now she thinks it's helping...  ANXIETY (ICD-300.00) - she declines anxiolytic Rx.  SHINGLES (ICD-053.9) - developed shingles Mar09 involving right sacral dermatomes w/ rash and pain in the perineal and buttock areas... treated via ER & several visits w/ NP w/ Valtrex, Pred, Lyrica, Vicodin... lesions resolved, some persist post-herpetic neuralgia...  ANEMIA, MILD (ICD-285.9) ~  labs 9/09 showed Hg= 11.8.[name]. ~  labs 3/10 showed Hg= 11.6, Fe= 64 (sat=20%) ~  labs 4/11 showed = Hg= 11.4 ~  Labs 4/12 showed Hg= 11.9 ~  Labs 6/13 showed Hg= 11.1, MCV=95  HEALTH MAINTENANCE:  she had the 2010 Flu shot 9/10... PNEUMOVAX in 2006 age age 50...   Past Surgical History  Procedure Laterality Date  . Pacemaker insertion      MDT  .  Appendectomy    . Cholecystectomy    . Tonsillectomy    . Vaginal hysterectomy    . Cataract extraction, bilateral  end of June 2013  . Cataract extraction, bilateral  beginning of July 2013    Outpatient Encounter Prescriptions as of 11/02/2012  Medication Sig Dispense Refill  . alendronate (FOSAMAX) 70 MG tablet TAKE 1 TABLET EVERY 7 DAYS.TAKE WITH A FULL GLASS OF  WATER ON AN EMPTY STOMACH  12 tablet  3  . Alpha-Lipoic Acid 200 MG CAPS Take 1 capsule by mouth daily.        [name] aspirin 325 MG tablet Take 325 mg by mouth daily.        . Calcium Carbonate-Vitamin D (CALTRATE 600+D) 600-400 MG-UNIT per tablet Take 1 tablet by mouth daily.        . cholecalciferol (VITAMIN D) 1000 UNITS tablet Take 1,000 Units by mouth daily.        [name] co-enzyme Q-10 30 MG capsule Take 30 mg by mouth daily.        . Cyanocobalamin (B-12) 2500 MCG  TABS Take 1 tablet by mouth every other day.       . docusate sodium (COLACE) 100 MG capsule Take 100 mg by mouth at bedtime.       . fish oil-omega-3 fatty acids 1000 MG capsule Take 1 g by mouth daily.        [name] glucose blood (ONE TOUCH ULTRA TEST) test strip Use to test once daily (790.29)  100 each  3  . Homeopathic Products ([employer] LEG CRAMPS PAIN RELIEF PO) 3 tablets a day as needed for leg cramps      . levothyroxine (SYNTHROID) 125 MCG tablet TAKE 1 TABLET DAILY  90 tablet  3  . ondansetron (ZOFRAN) 4 MG tablet Take 1 tablet (4 mg total) by mouth every 8 (eight) hours as needed for nausea.  30 tablet  0  . polyethylene glycol (MIRALAX / GLYCOLAX) packet Take 17 g by mouth daily. Make take doses twice a day if no bowel movement  14 each  1  . pregabalin (LYRICA) 75 MG capsule Take 1 capsule (75 mg total) by mouth at bedtime.  90 capsule  3  . Probiotic Product (PROBIOTIC FORMULA) CAPS Take 1 capsule by mouth daily.        . simvastatin (ZOCOR) 40 MG tablet Take 1 tablet (40 mg total) by mouth daily.  90 tablet  4  . traMADol (ULTRAM) 50 MG tablet Take 1 tablet by mouth every 6 (six) hours as needed.      . [DISCONTINUED] glucose blood (ONE TOUCH ULTRA TEST) test strip Use as instructed  100 each  3  . [DISCONTINUED] valsartan (DIOVAN) 160 MG tablet Take 1 tablet (160 mg total) by mouth daily.  30 tablet  5  . [DISCONTINUED] valsartan (DIOVAN) 80 MG tablet Take 1 tablet (80 mg total) by mouth daily.  30 tablet  5  . Blood Glucose Monitoring Suppl (ONE TOUCH ULTRA SYSTEM KIT) W/DEVICE KIT Use to test once daily (dx 790.29)  1 each  PRN  . [DISCONTINUED] ciprofloxacin (CIPRO) 250 MG tablet Take 1 tablet (250 mg total) by mouth 2 (two) times daily.  14 tablet  0   No facility-administered encounter medications on file as of 11/02/2012.    Allergies  Allergen Reactions  . Alprazolam     REACTION: eyes swell up  . Bactrim (Sulfamethoxazole W-Trimethoprim)     Unable to remember the side  effects   . Ceclor (Cefaclor)     Unsure of the side effects  . Cephalexin     Unsure of the side effects  . Clindamycin/Lincomycin     Upset stomach  . Hydrocodone     Unsure of reaction to med  . Noroxin (Norfloxacin)     Unsure of side effects  . Penicillins     REACTION: swells up all over body, pt states she almost died  . Sulfonamide Derivatives     REACTION: itching  . Tetracyclines & Related     Unsure of the reaction to this medication    Current Medications, Allergies, Past Medical History, Past Surgical History, Family History, and Social History were reviewed in [employer] record.    Review of Systems         See HPI - all other systems neg except as noted... The patient complains of decreased hearing, dyspnea on exertion, and difficulty walking.  The patient denies anorexia, fever, weight loss, weight gain, vision loss, hoarseness, chest pain, syncope, peripheral edema, prolonged cough, headaches, hemoptysis, abdominal pain, melena, hematochezia, severe indigestion/heartburn, hematuria, incontinence, muscle weakness, suspicious skin lesions, transient blindness, depression, unusual weight change, abnormal bleeding, enlarged lymph nodes, and angioedema.    Objective:   Physical Exam      WD, WN, 78 y/o WF in NAD;  VS- reviewed. GENERAL:  Alert & oriented; pleasant & cooperative... HEENT:  [place]/AT, EACs-clear (she has bilat hearing aides), TMs-wnl, NOSE-clear, THROAT-clear & wnl. NECK:  Supple w/ fair ROM; no JVD; normal carotid impulses w/o bruits; no thyromegaly or nodules palpated; no lymphadenopathy. CHEST:  Clear to P & A; without wheezes/ rales/ or rhonchi heard; pacer in right upper shoulder area. HEART:  Regular gr 2/6 SEM S4 without rubs... ABDOMEN:  Soft & nontender; normal bowel sounds; no organomegaly or masses detected. No guarding or rebound.  EXT: without deformities, mild arthritic changes; +varicose veins/ +venous insuffic/ no  edema. NEURO:   no focal neuro deficits x decr sensation in LE's c/w periph neuropathy.  DERM:  No lesions noted; no rash etc...   BMET 10/11/12     Component Value Date/Time   NA 139 10/11/2012 1126   K 4.9 10/11/2012 1126   CL 106 10/11/2012 1126   CO2 28 10/11/2012 1126   GLUCOSE 112* 10/11/2012 1126   BUN 20 10/11/2012 1126   CREATININE 0.8 10/11/2012 1126   CALCIUM 9.2 10/11/2012 1126   GFRNONAA 81* 09/29/2012 1244   GFRAA >90 09/29/2012 1244    Urinalysis    Component Value Date/Time   COLORURINE LT. YELLOW 10/11/2012 1126   APPEARANCEUR CLEAR 10/11/2012 1126   LABSPEC 1.010 10/11/2012 1126   PHURINE 6.5 10/11/2012 1126   GLUCOSEU NEGATIVE 09/29/2012 1257   HGBUR NEGATIVE 10/11/2012 1126   BILIRUBINUR NEGATIVE 10/11/2012 1126   KETONESUR NEGATIVE 10/11/2012 1126   PROTEINUR NEGATIVE 09/29/2012 1257   UROBILINOGEN 0.2 10/11/2012 1126   NITRITE NEGATIVE 10/11/2012 1126   LEUKOCYTESUR NEGATIVE 10/11/2012 1126    Neg occult stools cards -11/01/12   Assessment & Plan:

## 2012-11-02 NOTE — Assessment & Plan Note (Signed)
Constipation   Plan  Remain on stool softner daily .  Continue on Miralax  daily -hold if diarrhea.  Continue on Probiotic daily  Please contact office for sooner follow up if symptoms do not improve or worsen or seek emergency care  Follow up Dr. Kriste Basque as planned and As needed

## 2012-11-02 NOTE — Telephone Encounter (Signed)
Called spoke with patient, advised of TP's recs as stated below Pt requests a 90-day supply Rx change telephoned to [facility], spoke with pharmacist Revonda Standard  Nothing further needed, will sign off

## 2012-11-02 NOTE — Assessment & Plan Note (Signed)
Resolved  Remain off HCTZ

## 2012-11-02 NOTE — Telephone Encounter (Signed)
That is fine  Can use Losartan 100mg  daily #30 , As needed  Refills

## 2012-11-02 NOTE — Telephone Encounter (Signed)
TP, please advise if you are okay with this change and what strength/dosing. Thanks.

## 2012-11-05 ENCOUNTER — Telehealth: Payer: Self-pay | Admitting: Pulmonary Disease

## 2012-11-05 DIAGNOSIS — R7309 Other abnormal glucose: Secondary | ICD-10-CM

## 2012-11-05 MED ORDER — GLUCOSE BLOOD VI STRP: ORAL_STRIP | Status: DC

## 2012-11-05 MED ORDER — ONETOUCH ULTRA SYSTEM W/DEVICE KIT: PACK | Status: DC

## 2012-11-05 NOTE — Telephone Encounter (Signed)
Called and spoke with pt and she stated that her insurance will not cover the glucose test kit and strips through the mail order.  Pt requested that these [facility].  This has been sent in for the pt and pt is aware and nothing further is needed.

## 2012-11-09 ENCOUNTER — Telehealth: Payer: Self-pay | Admitting: Pulmonary Disease

## 2012-11-09 MED ORDER — SIMVASTATIN 40 MG PO TABS: 40.0000 mg | ORAL_TABLET | Freq: Every day | ORAL | Status: DC

## 2012-11-09 NOTE — Telephone Encounter (Signed)
Rx has been sent in. Pt is aware. 

## 2012-11-11 ENCOUNTER — Telehealth: Payer: Self-pay | Admitting: Pulmonary Disease

## 2012-11-11 MED ORDER — SIMVASTATIN 40 MG PO TABS: 40.0000 mg | ORAL_TABLET | Freq: Every day | ORAL | Status: DC

## 2012-11-11 NOTE — Telephone Encounter (Signed)
rx has been sent in to the pharmacy for #90 day supply for the pt .  i have called and lmom to make the pt aware. Nothing further is needed.

## 2012-11-21 ENCOUNTER — Other Ambulatory Visit: Payer: Self-pay | Admitting: Pulmonary Disease

## 2012-11-22 ENCOUNTER — Ambulatory Visit (INDEPENDENT_AMBULATORY_CARE_PROVIDER_SITE_OTHER): Payer: MEDICARE | Admitting: *Deleted

## 2012-11-22 DIAGNOSIS — I495 Sick sinus syndrome: Secondary | ICD-10-CM

## 2012-11-22 DIAGNOSIS — Z950 Presence of cardiac pacemaker: Secondary | ICD-10-CM | POA: Diagnosis not present

## 2012-11-29 LAB — REMOTE PACEMAKER DEVICE
AL AMPLITUDE: 2.8 mv
AL THRESHOLD: 0.375 V
BAMS-0001: 175 {beats}/min

## 2012-12-10 ENCOUNTER — Telehealth: Payer: Self-pay | Admitting: Pulmonary Disease

## 2012-12-10 MED ORDER — LEVOTHYROXINE SODIUM 125 MCG PO TABS: ORAL_TABLET | ORAL | Status: DC

## 2012-12-10 MED ORDER — PREGABALIN 75 MG PO CAPS: 75.0000 mg | ORAL_CAPSULE | Freq: Every day | ORAL | Status: DC

## 2012-12-10 NOTE — Telephone Encounter (Signed)
rx have been signed and faxed [facility].  Nothing further is needed.

## 2012-12-10 NOTE — Telephone Encounter (Signed)
Called [facility] per pts request.  They stated that the pt will still use the same pharmacy---[facility].  New rx just needed to be sent in for her and this has been done.  Nothing further is needed.

## 2012-12-15 ENCOUNTER — Encounter: Payer: Self-pay | Admitting: *Deleted

## 2012-12-20 ENCOUNTER — Telehealth: Payer: Self-pay | Admitting: Pulmonary Disease

## 2012-12-20 MED ORDER — PREGABALIN 75 MG PO CAPS: 75.0000 mg | ORAL_CAPSULE | Freq: Every day | ORAL | Status: DC

## 2012-12-20 MED ORDER — LEVOTHYROXINE SODIUM 125 MCG PO TABS: ORAL_TABLET | ORAL | Status: DC

## 2012-12-20 NOTE — Telephone Encounter (Signed)
I spoke with pt. She reports that she will not get her lyrica and levothyroxine from the mail order in time and will run out. She is needing RX to be sent to local pharmacy. I have done so. Nothing further needed

## 2012-12-22 DIAGNOSIS — Z961 Presence of intraocular lens: Secondary | ICD-10-CM | POA: Diagnosis not present

## 2012-12-22 DIAGNOSIS — H3530 Unspecified macular degeneration: Secondary | ICD-10-CM | POA: Diagnosis not present

## 2012-12-22 DIAGNOSIS — E119 Type 2 diabetes mellitus without complications: Secondary | ICD-10-CM | POA: Diagnosis not present

## 2012-12-22 DIAGNOSIS — H04129 Dry eye syndrome of unspecified lacrimal gland: Secondary | ICD-10-CM | POA: Diagnosis not present

## 2013-01-10 DIAGNOSIS — D0430 Carcinoma in situ of skin of unspecified part of face: Secondary | ICD-10-CM | POA: Diagnosis not present

## 2013-01-10 DIAGNOSIS — D0439 Carcinoma in situ of skin of other parts of face: Secondary | ICD-10-CM | POA: Diagnosis not present

## 2013-01-11 ENCOUNTER — Telehealth: Payer: Self-pay | Admitting: Pulmonary Disease

## 2013-01-11 DIAGNOSIS — IMO0001 Reserved for inherently not codable concepts without codable children: Secondary | ICD-10-CM | POA: Diagnosis not present

## 2013-01-11 DIAGNOSIS — M545 Low back pain, unspecified: Secondary | ICD-10-CM | POA: Diagnosis not present

## 2013-01-11 DIAGNOSIS — Q762 Congenital spondylolisthesis: Secondary | ICD-10-CM | POA: Diagnosis not present

## 2013-01-11 DIAGNOSIS — G609 Hereditary and idiopathic neuropathy, unspecified: Secondary | ICD-10-CM | POA: Diagnosis not present

## 2013-01-11 DIAGNOSIS — R269 Unspecified abnormalities of gait and mobility: Secondary | ICD-10-CM | POA: Diagnosis not present

## 2013-01-11 MED ORDER — LOSARTAN POTASSIUM 100 MG PO TABS: 100.0000 mg | ORAL_TABLET | Freq: Every day | ORAL | Status: DC

## 2013-01-11 MED ORDER — LEVOTHYROXINE SODIUM 125 MCG PO TABS: ORAL_TABLET | ORAL | Status: DC

## 2013-01-11 NOTE — Telephone Encounter (Signed)
Refills have been sent in for the pt to the mail order.  i have not seen anything for a PA on the synthroid.  This rx has been sent in as well and we will see if this needs a PA done.

## 2013-01-13 ENCOUNTER — Emergency Department (HOSPITAL_COMMUNITY)
Admission: EM | Admit: 2013-01-13 | Discharge: 2013-01-13 | Disposition: A | Discharge status: Home / Self Care | Payer: MEDICARE | Attending: Emergency Medicine | Admitting: Emergency Medicine

## 2013-01-13 ENCOUNTER — Emergency Department (HOSPITAL_COMMUNITY): Payer: MEDICARE

## 2013-01-13 ENCOUNTER — Encounter (HOSPITAL_COMMUNITY): Payer: Self-pay | Admitting: Emergency Medicine

## 2013-01-13 DIAGNOSIS — R5383 Other fatigue: Secondary | ICD-10-CM

## 2013-01-13 DIAGNOSIS — E785 Hyperlipidemia, unspecified: Secondary | ICD-10-CM | POA: Insufficient documentation

## 2013-01-13 DIAGNOSIS — M810 Age-related osteoporosis without current pathological fracture: Secondary | ICD-10-CM | POA: Diagnosis not present

## 2013-01-13 DIAGNOSIS — E039 Hypothyroidism, unspecified: Secondary | ICD-10-CM | POA: Diagnosis not present

## 2013-01-13 DIAGNOSIS — G609 Hereditary and idiopathic neuropathy, unspecified: Secondary | ICD-10-CM | POA: Insufficient documentation

## 2013-01-13 DIAGNOSIS — Z7982 Long term (current) use of aspirin: Secondary | ICD-10-CM | POA: Diagnosis not present

## 2013-01-13 DIAGNOSIS — Z79899 Other long term (current) drug therapy: Secondary | ICD-10-CM | POA: Diagnosis not present

## 2013-01-13 DIAGNOSIS — K589 Irritable bowel syndrome without diarrhea: Secondary | ICD-10-CM | POA: Diagnosis not present

## 2013-01-13 DIAGNOSIS — I10 Essential (primary) hypertension: Secondary | ICD-10-CM | POA: Diagnosis not present

## 2013-01-13 DIAGNOSIS — R42 Dizziness and giddiness: Secondary | ICD-10-CM | POA: Diagnosis not present

## 2013-01-13 DIAGNOSIS — H9190 Unspecified hearing loss, unspecified ear: Secondary | ICD-10-CM | POA: Diagnosis not present

## 2013-01-13 DIAGNOSIS — F411 Generalized anxiety disorder: Secondary | ICD-10-CM | POA: Diagnosis not present

## 2013-01-13 DIAGNOSIS — R5381 Other malaise: Secondary | ICD-10-CM | POA: Insufficient documentation

## 2013-01-13 DIAGNOSIS — Z880 Allergy status to penicillin: Secondary | ICD-10-CM | POA: Diagnosis not present

## 2013-01-13 DIAGNOSIS — I517 Cardiomegaly: Secondary | ICD-10-CM | POA: Diagnosis not present

## 2013-01-13 DIAGNOSIS — Z950 Presence of cardiac pacemaker: Secondary | ICD-10-CM | POA: Diagnosis not present

## 2013-01-13 DIAGNOSIS — M1990 Unspecified osteoarthritis, unspecified site: Secondary | ICD-10-CM | POA: Diagnosis not present

## 2013-01-13 LAB — COMPREHENSIVE METABOLIC PANEL
ALT: 17 U/L (ref 0–35)
Albumin: 3.6 g/dL (ref 3.5–5.2)
Alkaline Phosphatase: 70 U/L (ref 39–117)
BUN: 18 mg/dL (ref 6–23)
CO2: 23 mEq/L (ref 19–32)
Chloride: 98 mEq/L (ref 96–112)
GFR calc Af Amer: 88 mL/min — ABNORMAL LOW (ref >90)
GFR calc non Af Amer: 76 mL/min — ABNORMAL LOW (ref >90)
Glucose, Bld: 150 mg/dL — ABNORMAL HIGH (ref 70–99)
Potassium: 4.6 mEq/L (ref 3.5–5.1)
Sodium: 132 mEq/L — ABNORMAL LOW (ref 135–145)
Total Bilirubin: 0.4 mg/dL (ref 0.3–1.2)
Total Protein: 6.6 g/dL (ref 6.0–8.3)

## 2013-01-13 LAB — CBC WITH DIFFERENTIAL/PLATELET
Basophils Relative: 1 % (ref 0–1)
Eosinophils Absolute: 0.4 10*3/uL (ref 0.0–0.7)
HCT: 36.4 % (ref 36.0–46.0)
Hemoglobin: 12.3 g/dL (ref 12.0–15.0)
Lymphocytes Relative: 35 % (ref 12–46)
Lymphs Abs: 2.4 10*3/uL (ref 0.7–4.0)
MCH: 31.9 pg (ref 26.0–34.0)
MCHC: 33.8 g/dL (ref 30.0–36.0)
Monocytes Relative: 9 % (ref 3–12)
Neutrophils Relative %: 49 % (ref 43–77)
RBC: 3.85 MIL/uL — ABNORMAL LOW (ref 3.87–5.11)

## 2013-01-13 LAB — URINALYSIS, ROUTINE W REFLEX MICROSCOPIC
Bilirubin Urine: NEGATIVE
Ketones, ur: NEGATIVE mg/dL
Leukocytes, UA: NEGATIVE
Nitrite: NEGATIVE
Protein, ur: NEGATIVE mg/dL
Urobilinogen, UA: 0.2 mg/dL (ref 0.0–1.0)

## 2013-01-13 NOTE — ED Notes (Signed)
Pt for the past 2 days has not been feeling well, states that she is dizzy . No N/V alert x4,

## 2013-01-13 NOTE — ED Provider Notes (Signed)
CSN: [id number]     Arrival date & time 01/13/13  1330 History   First MD Initiated Contact with Patient 01/13/13 1334     Chief Complaint  Patient presents with  . Dizziness   (Consider location/radiation/quality/duration/timing/severity/associated sxs/prior Treatment) Patient is a 79 y.o. female presenting with general illness. The history is provided by the patient.  Illness Location:  Head felt like it weighed 50 pounds Quality:  Heaviness Severity:  Moderate Onset quality:  Gradual Timing:  Constant Progression:  Resolved Chronicity:  New Context:  Was at a funeral, had sensation of head heaviness Relieved by:  Rest, lying down Worsened by:  Nothing Ineffective treatments:  None Associated symptoms: no abdominal pain, no chest pain, no cough, no diarrhea, no fever, no headaches, no loss of consciousness, no nausea, no rash, no rhinorrhea, no shortness of breath, no sore throat, no vomiting and no wheezing     Past Medical History  Diagnosis Date  . Hypertension   . Hyperlipidemia   . Aortic valve disorders   . Unspecified hereditary and idiopathic peripheral neuropathy   . Other abnormal glucose   . Unspecified hearing loss   . Unspecified chronic bronchitis   . Unspecified hypothyroidism   . Unspecified gastritis and gastroduodenitis without mention of hemorrhage   . Diverticulosis of colon (without mention of hemorrhage)   . Irritable bowel syndrome   . Osteoarthrosis, unspecified whether generalized or localized, unspecified site   . Backache, unspecified   . Pain in limb   . Osteoporosis, unspecified   . Herpes zoster with other nervous system complications(053.19)   . Anxiety state, unspecified   . Anemia, unspecified   . Bradycardia     s/p PPM   Past Surgical History  Procedure Laterality Date  . Pacemaker insertion      MDT  . Appendectomy    . Cholecystectomy    . Tonsillectomy    . Vaginal hysterectomy    . Cataract extraction, bilateral  end of  June 2013  . Cataract extraction, bilateral  beginning of July 2013   Family History  Problem Relation Age of Onset  . Heart disease Father   . Cancer Paternal Grandmother   . Diabetes Father   . Heart disease Mother    History  Substance Use Topics  . Smoking status: Never Smoker   . Smokeless tobacco: Never Used  . Alcohol Use: No   OB History   Grav Para Term Preterm Abortions TAB SAB Ect Mult Living                 Review of Systems  Constitutional: Negative for fever.  HENT: Negative for sore throat and rhinorrhea.   Respiratory: Negative for cough, shortness of breath and wheezing.   Cardiovascular: Negative for chest pain.  Gastrointestinal: Negative for nausea, vomiting, abdominal pain and diarrhea.  Skin: Negative for rash.  Neurological: Negative for loss of consciousness and headaches.  All other systems reviewed and are negative.    Allergies  Alprazolam; Bactrim; Ceclor; Cephalexin; Clindamycin/lincomycin; Hydrocodone; Noroxin; Penicillins; Sulfonamide derivatives; and Tetracyclines & related  Home Medications   Current Outpatient Rx  Name  Route  Sig  Dispense  Refill  . alendronate (FOSAMAX) 70 MG tablet      TAKE 1 TABLET EVERY 7 DAYS.TAKE WITH A FULL GLASS OF  WATER ON AN EMPTY STOMACH   12 tablet   3   . Alpha-Lipoic Acid 200 MG CAPS   Oral   Take 1 capsule by  mouth daily.           [name] aspirin 325 MG tablet   Oral   Take 325 mg by mouth daily.           . Blood Glucose Monitoring Suppl (ONE TOUCH ULTRA SYSTEM KIT) W/DEVICE KIT      Use to test once daily (dx 790.29), please file under medicare part b   1 each   0   . Calcium Carbonate-Vitamin D (CALTRATE 600+D) 600-400 MG-UNIT per tablet   Oral   Take 1 tablet by mouth daily.           . cholecalciferol (VITAMIN D) 1000 UNITS tablet   Oral   Take 1,000 Units by mouth daily.           [name] co-enzyme Q-10 30 MG capsule   Oral   Take 30 mg by mouth daily.           .  Cyanocobalamin (B-12) 2500 MCG TABS   Oral   Take 1 tablet by mouth every other day.          . docusate sodium (COLACE) 100 MG capsule   Oral   Take 100 mg by mouth at bedtime.          . fish oil-omega-3 fatty acids 1000 MG capsule   Oral   Take 1 g by mouth daily.           [name] glucose blood (ONE TOUCH ULTRA TEST) test strip      Use to test once daily (790.29)   100 each   3     Please file under medicare part b   . glucose blood test strip      Use to test once daily (790.29), file under medicare part b         . Homeopathic Products ([employer] LEG CRAMPS PAIN RELIEF PO)      3 tablets a day as needed for leg cramps         . levothyroxine (SYNTHROID) 125 MCG tablet      TAKE 1 TABLET DAILY   90 tablet   3   . losartan (COZAAR) 100 MG tablet   Oral   Take 1 tablet (100 mg total) by mouth daily.   90 tablet   3   . ondansetron (ZOFRAN) 4 MG tablet   Oral   Take 1 tablet (4 mg total) by mouth every 8 (eight) hours as needed for nausea.   30 tablet   0   . polyethylene glycol (MIRALAX / GLYCOLAX) packet   Oral   Take 17 g by mouth daily. Make take doses twice a day if no bowel movement   14 each   1   . pregabalin (LYRICA) 75 MG capsule   Oral   Take 1 capsule (75 mg total) by mouth at bedtime.   30 capsule   0   . Probiotic Product (PROBIOTIC FORMULA) CAPS   Oral   Take 1 capsule by mouth daily.           . simvastatin (ZOCOR) 40 MG tablet      TAKE 1 TABLET DAILY   90 tablet   0   . traMADol (ULTRAM) 50 MG tablet   Oral   Take 1 tablet by mouth every 6 (six) hours as needed.          BP 177/75  Pulse 67  Temp(Src) 98 F (36.7 C) (Oral)  Resp  16  SpO2 97% Physical Exam  Nursing note and vitals reviewed. Constitutional: She is oriented to person, place, and time. She appears well-developed and well-nourished. No distress.  HENT:  Head: Normocephalic and atraumatic.  Eyes: EOM are normal. Pupils are equal, round, and reactive  to light.  Neck: Normal range of motion. Neck supple.  Cardiovascular: Normal rate and regular rhythm.  Exam reveals no friction rub.   No murmur heard. Pulmonary/Chest: Effort normal and breath sounds normal. No respiratory distress. She has no wheezes. She has no rales.  Abdominal: Soft. She exhibits no distension. There is no tenderness. There is no rebound.  Musculoskeletal: Normal range of motion. She exhibits no edema.  Neurological: She is alert and oriented to person, place, and time. No cranial nerve deficit. She exhibits normal muscle tone. Coordination normal.  Skin: She is not diaphoretic.    ED Course  Procedures (including critical care time) Labs Review Labs Reviewed  CBC WITH DIFFERENTIAL  COMPREHENSIVE METABOLIC PANEL  URINALYSIS, ROUTINE W REFLEX MICROSCOPIC   Imaging Review No results found.   Date: 01/13/2013  Rate: 61  Rhythm: Paced rhythm  QRS Axis: normal  Intervals: PR prolonged and QRS widened - paced rhythm, similar to previous  ST/T Wave abnormalities: T waves inverted in I and aVL, similar to previous  Conduction Disutrbances:none  Narrative Interpretation:   Old EKG Reviewed: unchanged    MDM   1. Fatigue     79 year old female presents with feelings of head heaviness. This lasted about an hour to an hour half. She is at a 0 when this started. She denies any dizziness, chest pain, shortness of breath, vomiting, diaphoresis. She states it is felt like her head weight 50 pounds. The feeling is resolved now. She states feeling very bad yesterday and is very weak. Normally she is active, going to grocery store, go to church, however yesterday she does was unable to do things. She denies any fevers, or abdominal pain. Here her vitals are stable. Lungs are clear. Belly soft and nontender. Her neuro exam is normal with normal cranial nerves, normal strength and sensation in all extremities, normal coordination. Will check basic labs including troponin,  urinalysis, chest x-ray, CT head. Heaviness feeling in her head doesn't seem cardiac related. No dizziness, no need for ACS r/o. Patient has normal lab, normal Head CT, normal urine. On talking to patient again, she states she thinks it was likely due to not sleeping overnight due to her neuropathy. She's feeling better now. She has two sons staying with her tonight. Patient instructed to take tylenol and occasional NSAIDs for her pain. We discussed not starting narcotics, son and patient agree that narcotics are not needed. Patient given strict return precautions.   Dagmar Hait, MD 01/13/13 [phone]

## 2013-01-14 DIAGNOSIS — IMO0001 Reserved for inherently not codable concepts without codable children: Secondary | ICD-10-CM | POA: Diagnosis not present

## 2013-01-14 DIAGNOSIS — Q762 Congenital spondylolisthesis: Secondary | ICD-10-CM | POA: Diagnosis not present

## 2013-01-14 DIAGNOSIS — G609 Hereditary and idiopathic neuropathy, unspecified: Secondary | ICD-10-CM | POA: Diagnosis not present

## 2013-01-14 DIAGNOSIS — M545 Low back pain, unspecified: Secondary | ICD-10-CM | POA: Diagnosis not present

## 2013-01-14 DIAGNOSIS — R269 Unspecified abnormalities of gait and mobility: Secondary | ICD-10-CM | POA: Diagnosis not present

## 2013-01-17 DIAGNOSIS — M545 Low back pain, unspecified: Secondary | ICD-10-CM | POA: Diagnosis not present

## 2013-01-17 DIAGNOSIS — Q762 Congenital spondylolisthesis: Secondary | ICD-10-CM | POA: Diagnosis not present

## 2013-01-17 DIAGNOSIS — IMO0001 Reserved for inherently not codable concepts without codable children: Secondary | ICD-10-CM | POA: Diagnosis not present

## 2013-01-17 DIAGNOSIS — G609 Hereditary and idiopathic neuropathy, unspecified: Secondary | ICD-10-CM | POA: Diagnosis not present

## 2013-01-17 DIAGNOSIS — R269 Unspecified abnormalities of gait and mobility: Secondary | ICD-10-CM | POA: Diagnosis not present

## 2013-01-18 ENCOUNTER — Encounter: Payer: Self-pay | Admitting: Internal Medicine

## 2013-01-19 DIAGNOSIS — R269 Unspecified abnormalities of gait and mobility: Secondary | ICD-10-CM | POA: Diagnosis not present

## 2013-01-19 DIAGNOSIS — M545 Low back pain, unspecified: Secondary | ICD-10-CM | POA: Diagnosis not present

## 2013-01-19 DIAGNOSIS — IMO0001 Reserved for inherently not codable concepts without codable children: Secondary | ICD-10-CM | POA: Diagnosis not present

## 2013-01-19 DIAGNOSIS — Q762 Congenital spondylolisthesis: Secondary | ICD-10-CM | POA: Diagnosis not present

## 2013-01-19 DIAGNOSIS — G609 Hereditary and idiopathic neuropathy, unspecified: Secondary | ICD-10-CM | POA: Diagnosis not present

## 2013-01-21 DIAGNOSIS — L089 Local infection of the skin and subcutaneous tissue, unspecified: Secondary | ICD-10-CM | POA: Diagnosis not present

## 2013-01-24 DIAGNOSIS — G609 Hereditary and idiopathic neuropathy, unspecified: Secondary | ICD-10-CM | POA: Diagnosis not present

## 2013-01-24 DIAGNOSIS — R269 Unspecified abnormalities of gait and mobility: Secondary | ICD-10-CM | POA: Diagnosis not present

## 2013-01-24 DIAGNOSIS — IMO0001 Reserved for inherently not codable concepts without codable children: Secondary | ICD-10-CM | POA: Diagnosis not present

## 2013-01-24 DIAGNOSIS — M545 Low back pain, unspecified: Secondary | ICD-10-CM | POA: Diagnosis not present

## 2013-01-24 DIAGNOSIS — Q762 Congenital spondylolisthesis: Secondary | ICD-10-CM | POA: Diagnosis not present

## 2013-01-26 DIAGNOSIS — R269 Unspecified abnormalities of gait and mobility: Secondary | ICD-10-CM | POA: Diagnosis not present

## 2013-01-26 DIAGNOSIS — M545 Low back pain, unspecified: Secondary | ICD-10-CM | POA: Diagnosis not present

## 2013-01-26 DIAGNOSIS — Q762 Congenital spondylolisthesis: Secondary | ICD-10-CM | POA: Diagnosis not present

## 2013-01-26 DIAGNOSIS — IMO0001 Reserved for inherently not codable concepts without codable children: Secondary | ICD-10-CM | POA: Diagnosis not present

## 2013-01-26 DIAGNOSIS — G609 Hereditary and idiopathic neuropathy, unspecified: Secondary | ICD-10-CM | POA: Diagnosis not present

## 2013-01-31 DIAGNOSIS — R269 Unspecified abnormalities of gait and mobility: Secondary | ICD-10-CM | POA: Diagnosis not present

## 2013-01-31 DIAGNOSIS — Q762 Congenital spondylolisthesis: Secondary | ICD-10-CM | POA: Diagnosis not present

## 2013-01-31 DIAGNOSIS — G609 Hereditary and idiopathic neuropathy, unspecified: Secondary | ICD-10-CM | POA: Diagnosis not present

## 2013-01-31 DIAGNOSIS — M545 Low back pain, unspecified: Secondary | ICD-10-CM | POA: Diagnosis not present

## 2013-01-31 DIAGNOSIS — IMO0001 Reserved for inherently not codable concepts without codable children: Secondary | ICD-10-CM | POA: Diagnosis not present

## 2013-02-01 DIAGNOSIS — Z23 Encounter for immunization: Secondary | ICD-10-CM | POA: Diagnosis not present

## 2013-02-02 DIAGNOSIS — R269 Unspecified abnormalities of gait and mobility: Secondary | ICD-10-CM | POA: Diagnosis not present

## 2013-02-02 DIAGNOSIS — M545 Low back pain, unspecified: Secondary | ICD-10-CM | POA: Diagnosis not present

## 2013-02-02 DIAGNOSIS — G609 Hereditary and idiopathic neuropathy, unspecified: Secondary | ICD-10-CM | POA: Diagnosis not present

## 2013-02-02 DIAGNOSIS — IMO0001 Reserved for inherently not codable concepts without codable children: Secondary | ICD-10-CM | POA: Diagnosis not present

## 2013-02-02 DIAGNOSIS — Q762 Congenital spondylolisthesis: Secondary | ICD-10-CM | POA: Diagnosis not present

## 2013-02-08 DIAGNOSIS — IMO0001 Reserved for inherently not codable concepts without codable children: Secondary | ICD-10-CM | POA: Diagnosis not present

## 2013-02-08 DIAGNOSIS — G609 Hereditary and idiopathic neuropathy, unspecified: Secondary | ICD-10-CM | POA: Diagnosis not present

## 2013-02-08 DIAGNOSIS — Q762 Congenital spondylolisthesis: Secondary | ICD-10-CM | POA: Diagnosis not present

## 2013-02-08 DIAGNOSIS — M545 Low back pain, unspecified: Secondary | ICD-10-CM | POA: Diagnosis not present

## 2013-02-08 DIAGNOSIS — R269 Unspecified abnormalities of gait and mobility: Secondary | ICD-10-CM | POA: Diagnosis not present

## 2013-02-09 DIAGNOSIS — M545 Low back pain, unspecified: Secondary | ICD-10-CM | POA: Diagnosis not present

## 2013-02-10 DIAGNOSIS — G609 Hereditary and idiopathic neuropathy, unspecified: Secondary | ICD-10-CM | POA: Diagnosis not present

## 2013-02-10 DIAGNOSIS — IMO0001 Reserved for inherently not codable concepts without codable children: Secondary | ICD-10-CM | POA: Diagnosis not present

## 2013-02-10 DIAGNOSIS — M545 Low back pain, unspecified: Secondary | ICD-10-CM | POA: Diagnosis not present

## 2013-02-10 DIAGNOSIS — Q762 Congenital spondylolisthesis: Secondary | ICD-10-CM | POA: Diagnosis not present

## 2013-02-10 DIAGNOSIS — R269 Unspecified abnormalities of gait and mobility: Secondary | ICD-10-CM | POA: Diagnosis not present

## 2013-02-14 DIAGNOSIS — IMO0001 Reserved for inherently not codable concepts without codable children: Secondary | ICD-10-CM | POA: Diagnosis not present

## 2013-02-14 DIAGNOSIS — Q762 Congenital spondylolisthesis: Secondary | ICD-10-CM | POA: Diagnosis not present

## 2013-02-14 DIAGNOSIS — M545 Low back pain, unspecified: Secondary | ICD-10-CM | POA: Diagnosis not present

## 2013-02-14 DIAGNOSIS — G609 Hereditary and idiopathic neuropathy, unspecified: Secondary | ICD-10-CM | POA: Diagnosis not present

## 2013-02-14 DIAGNOSIS — R269 Unspecified abnormalities of gait and mobility: Secondary | ICD-10-CM | POA: Diagnosis not present

## 2013-02-16 DIAGNOSIS — IMO0001 Reserved for inherently not codable concepts without codable children: Secondary | ICD-10-CM | POA: Diagnosis not present

## 2013-02-16 DIAGNOSIS — R269 Unspecified abnormalities of gait and mobility: Secondary | ICD-10-CM | POA: Diagnosis not present

## 2013-02-16 DIAGNOSIS — M545 Low back pain, unspecified: Secondary | ICD-10-CM | POA: Diagnosis not present

## 2013-02-16 DIAGNOSIS — Q762 Congenital spondylolisthesis: Secondary | ICD-10-CM | POA: Diagnosis not present

## 2013-02-16 DIAGNOSIS — G609 Hereditary and idiopathic neuropathy, unspecified: Secondary | ICD-10-CM | POA: Diagnosis not present

## 2013-02-21 DIAGNOSIS — Q762 Congenital spondylolisthesis: Secondary | ICD-10-CM | POA: Diagnosis not present

## 2013-02-21 DIAGNOSIS — G609 Hereditary and idiopathic neuropathy, unspecified: Secondary | ICD-10-CM | POA: Diagnosis not present

## 2013-02-21 DIAGNOSIS — IMO0001 Reserved for inherently not codable concepts without codable children: Secondary | ICD-10-CM | POA: Diagnosis not present

## 2013-02-21 DIAGNOSIS — R269 Unspecified abnormalities of gait and mobility: Secondary | ICD-10-CM | POA: Diagnosis not present

## 2013-02-21 DIAGNOSIS — M545 Low back pain, unspecified: Secondary | ICD-10-CM | POA: Diagnosis not present

## 2013-02-23 DIAGNOSIS — M545 Low back pain, unspecified: Secondary | ICD-10-CM | POA: Diagnosis not present

## 2013-02-23 DIAGNOSIS — G609 Hereditary and idiopathic neuropathy, unspecified: Secondary | ICD-10-CM | POA: Diagnosis not present

## 2013-02-23 DIAGNOSIS — R269 Unspecified abnormalities of gait and mobility: Secondary | ICD-10-CM | POA: Diagnosis not present

## 2013-02-23 DIAGNOSIS — Q762 Congenital spondylolisthesis: Secondary | ICD-10-CM | POA: Diagnosis not present

## 2013-02-23 DIAGNOSIS — IMO0001 Reserved for inherently not codable concepts without codable children: Secondary | ICD-10-CM | POA: Diagnosis not present

## 2013-02-28 DIAGNOSIS — IMO0001 Reserved for inherently not codable concepts without codable children: Secondary | ICD-10-CM | POA: Diagnosis not present

## 2013-02-28 DIAGNOSIS — M545 Low back pain, unspecified: Secondary | ICD-10-CM | POA: Diagnosis not present

## 2013-02-28 DIAGNOSIS — R269 Unspecified abnormalities of gait and mobility: Secondary | ICD-10-CM | POA: Diagnosis not present

## 2013-02-28 DIAGNOSIS — Q762 Congenital spondylolisthesis: Secondary | ICD-10-CM | POA: Diagnosis not present

## 2013-02-28 DIAGNOSIS — G609 Hereditary and idiopathic neuropathy, unspecified: Secondary | ICD-10-CM | POA: Diagnosis not present

## 2013-03-02 DIAGNOSIS — M545 Low back pain, unspecified: Secondary | ICD-10-CM | POA: Diagnosis not present

## 2013-03-02 DIAGNOSIS — Q762 Congenital spondylolisthesis: Secondary | ICD-10-CM | POA: Diagnosis not present

## 2013-03-02 DIAGNOSIS — R269 Unspecified abnormalities of gait and mobility: Secondary | ICD-10-CM | POA: Diagnosis not present

## 2013-03-02 DIAGNOSIS — IMO0001 Reserved for inherently not codable concepts without codable children: Secondary | ICD-10-CM | POA: Diagnosis not present

## 2013-03-02 DIAGNOSIS — G609 Hereditary and idiopathic neuropathy, unspecified: Secondary | ICD-10-CM | POA: Diagnosis not present

## 2013-03-03 ENCOUNTER — Encounter: Payer: Self-pay | Admitting: Internal Medicine

## 2013-03-03 ENCOUNTER — Ambulatory Visit (INDEPENDENT_AMBULATORY_CARE_PROVIDER_SITE_OTHER): Payer: MEDICARE | Admitting: Internal Medicine

## 2013-03-03 VITALS — BP 146/68 | HR 68 | Ht 64.0 in | Wt 154.4 lb

## 2013-03-03 DIAGNOSIS — Z950 Presence of cardiac pacemaker: Secondary | ICD-10-CM

## 2013-03-03 DIAGNOSIS — I495 Sick sinus syndrome: Secondary | ICD-10-CM

## 2013-03-03 LAB — MDC_IDC_ENUM_SESS_TYPE_INCLINIC
Battery Impedance: 157 Ohm
Battery Voltage: 2.78 V
Brady Statistic AP VS Percent: 0 %
Brady Statistic AS VP Percent: 73 %
Brady Statistic AS VS Percent: 5 %
Date Time Interrogation Session: 20141120214857
Lead Channel Impedance Value: 486 Ohm
Lead Channel Impedance Value: 508 Ohm
Lead Channel Pacing Threshold Amplitude: 0.5 V
Lead Channel Pacing Threshold Amplitude: 0.5 V
Lead Channel Pacing Threshold Pulse Width: 0.4 ms
Lead Channel Pacing Threshold Pulse Width: 0.4 ms
Lead Channel Sensing Intrinsic Amplitude: 15.67 mV
Lead Channel Setting Sensing Sensitivity: 5.6 mV

## 2013-03-03 NOTE — Patient Instructions (Signed)
Your physician wants you to follow-up in: 6 months with Kayla Davis and 12 months with Dr Jacquiline Doe will receive a reminder letter in the mail two months in advance. If you don't receive a letter, please call our office to schedule the follow-up appointment.   Remote monitoring is used to monitor your Pacemaker or ICD from home. This monitoring reduces the number of office visits required to check your device to one time per year. It allows [place] to keep an eye on the functioning of your device to ensure it is working properly. You are scheduled for a device check from home on 06/06/13. You may send your transmission at any time that day. If you have a wireless device, the transmission will be sent automatically. After your physician reviews your transmission, you will receive a postcard with your next transmission date.

## 2013-03-03 NOTE — Progress Notes (Signed)
PCP: Michele Mcalpine, MD  Kayla Davis is a 79 y.o. female who presents today for routine electrophysiology followup.  Since last being seen in our clinic, the patient reports doing very well.  Today, she denies symptoms of palpitations, chest pain, shortness of breath,  dizziness, presyncope, or syncope.  She has chronic pain and swelling in her L leg.  She has previously had a doppler 2012 which excluded DVT.  She has been advised to wear support hose for venous insufficiency by primary care but has not been compliant with this.  The patient is otherwise without complaint today.   Past Medical History  Diagnosis Date  . Hypertension   . Hyperlipidemia   . Aortic valve disorders   . Unspecified hereditary and idiopathic peripheral neuropathy   . Other abnormal glucose   . Unspecified hearing loss   . Unspecified chronic bronchitis   . Unspecified hypothyroidism   . Unspecified gastritis and gastroduodenitis without mention of hemorrhage   . Diverticulosis of colon (without mention of hemorrhage)   . Irritable bowel syndrome   . Osteoarthrosis, unspecified whether generalized or localized, unspecified site   . Backache, unspecified   . Pain in limb   . Osteoporosis, unspecified   . Herpes zoster with other nervous system complications(053.19)   . Anxiety state, unspecified   . Anemia, unspecified   . Bradycardia     s/p PPM   Past Surgical History  Procedure Laterality Date  . Pacemaker insertion      MDT  . Appendectomy    . Cholecystectomy    . Tonsillectomy    . Vaginal hysterectomy    . Cataract extraction, bilateral  end of June 2013  . Cataract extraction, bilateral  beginning of July 2013    Current Outpatient Prescriptions  Medication Sig Dispense Refill  . alendronate (FOSAMAX) 70 MG tablet TAKE 1 TABLET EVERY 7 DAYS.TAKE WITH A FULL GLASS OF  WATER ON AN EMPTY STOMACH  12 tablet  3  . Alpha-Lipoic Acid 200 MG CAPS Take 1 capsule by mouth daily.       [name] aspirin  325 MG tablet Take 325 mg by mouth daily.        . Calcium Carbonate-Vitamin D (CALTRATE 600+D) 600-400 MG-UNIT per tablet Take 1 tablet by mouth daily.        . cholecalciferol (VITAMIN D) 1000 UNITS tablet Take 1,000 Units by mouth daily.        [name] co-enzyme Q-10 30 MG capsule Take 30 mg by mouth daily.        . Cyanocobalamin (B-12) 2500 MCG TABS Take 1 tablet by mouth daily.       [name] docusate sodium (COLACE) 100 MG capsule Take 100 mg by mouth at bedtime.       . fish oil-omega-3 fatty acids 1000 MG capsule Take 1 g by mouth daily.        . Homeopathic Products ([employer] LEG CRAMPS PAIN RELIEF PO) 3 tablets a day as needed for leg cramps      . levothyroxine (SYNTHROID) 125 MCG tablet TAKE 1 TABLET DAILY  90 tablet  3  . losartan (COZAAR) 100 MG tablet Take 1 tablet (100 mg total) by mouth daily.  90 tablet  3  . Misc Natural Products (OSTEO BI-FLEX ADV DOUBLE ST) TABS Take 1 tablet by mouth 2 (two) times daily.      . polyethylene glycol (MIRALAX / GLYCOLAX) packet Take 17 g by mouth as needed.      [name]  pregabalin (LYRICA) 75 MG capsule Take 1 capsule (75 mg total) by mouth at bedtime.  30 capsule  0  . Probiotic Product (PROBIOTIC FORMULA) CAPS Take 1 capsule by mouth daily.        . simvastatin (ZOCOR) 40 MG tablet Take 40 mg by mouth at bedtime.      . [DISCONTINUED] valsartan-hydrochlorothiazide (DIOVAN-HCT) 80-12.5 MG per tablet TAKE 1 TABLET BY MOUTH DAILY  90 tablet  3   No current facility-administered medications for this visit.    Physical Exam: Filed Vitals:   03/03/13 1519  BP: 161/74  Pulse: 68  Height: 5\' 4"  (1.626 m)  Weight: 154 lb 6.4 oz (70.035 kg)    GEN- The patient is elderly appearing, alert and oriented x 3 today.   Head- normocephalic, atraumatic Eyes-  Sclera clear, conjunctiva pink Ears- hearing intact Oropharynx- clear Lungs- Clear to ausculation bilaterally, normal work of breathing Chest- pacemaker pocket is well healed Heart- Regular rate and rhythm  GI-  soft, NT, ND, + BS Extremities- no clubbing, cyanosis, +1 L leg edema   Pacemaker interrogation- reviewed in detail today,  See PACEART report  Assessment and Plan:  1. Sick sinus bradycardia Normal pacemaker function See Pace Art report No changes today  2. Venous insufficiency Support hose encouraged  3. HTN Stable No change required today  Return to see Kayla Davis in 6 months I will see in a year

## 2013-03-14 ENCOUNTER — Telehealth: Payer: Self-pay | Admitting: Pulmonary Disease

## 2013-03-14 NOTE — Telephone Encounter (Signed)
I spoke with pt. She reports Dr. Ethelene Hal doubled up on her lyrica. She is needing rx sent. I advised her this eneds to come from Dr. Ethelene Hal since we did not change RX. She will call his office.

## 2013-03-22 ENCOUNTER — Encounter: Payer: Self-pay | Admitting: Pulmonary Disease

## 2013-03-22 ENCOUNTER — Ambulatory Visit (INDEPENDENT_AMBULATORY_CARE_PROVIDER_SITE_OTHER): Payer: MEDICARE | Admitting: Pulmonary Disease

## 2013-03-22 ENCOUNTER — Other Ambulatory Visit (INDEPENDENT_AMBULATORY_CARE_PROVIDER_SITE_OTHER): Payer: MEDICARE

## 2013-03-22 VITALS — BP 114/62 | HR 62 | Temp 97.7°F | Ht 64.0 in | Wt 156.0 lb

## 2013-03-22 DIAGNOSIS — I359 Nonrheumatic aortic valve disorder, unspecified: Secondary | ICD-10-CM

## 2013-03-22 DIAGNOSIS — E039 Hypothyroidism, unspecified: Secondary | ICD-10-CM

## 2013-03-22 DIAGNOSIS — R32 Unspecified urinary incontinence: Secondary | ICD-10-CM

## 2013-03-22 DIAGNOSIS — I872 Venous insufficiency (chronic) (peripheral): Secondary | ICD-10-CM | POA: Diagnosis not present

## 2013-03-22 DIAGNOSIS — R7309 Other abnormal glucose: Secondary | ICD-10-CM

## 2013-03-22 DIAGNOSIS — I10 Essential (primary) hypertension: Secondary | ICD-10-CM | POA: Diagnosis not present

## 2013-03-22 DIAGNOSIS — M549 Dorsalgia, unspecified: Secondary | ICD-10-CM

## 2013-03-22 DIAGNOSIS — R609 Edema, unspecified: Secondary | ICD-10-CM | POA: Diagnosis not present

## 2013-03-22 DIAGNOSIS — E785 Hyperlipidemia, unspecified: Secondary | ICD-10-CM

## 2013-03-22 DIAGNOSIS — M1990 Unspecified osteoarthritis, unspecified site: Secondary | ICD-10-CM

## 2013-03-22 DIAGNOSIS — F411 Generalized anxiety disorder: Secondary | ICD-10-CM

## 2013-03-22 DIAGNOSIS — G609 Hereditary and idiopathic neuropathy, unspecified: Secondary | ICD-10-CM

## 2013-03-22 DIAGNOSIS — M810 Age-related osteoporosis without current pathological fracture: Secondary | ICD-10-CM

## 2013-03-22 DIAGNOSIS — Z950 Presence of cardiac pacemaker: Secondary | ICD-10-CM

## 2013-03-22 LAB — URINALYSIS, ROUTINE W REFLEX MICROSCOPIC
RBC / HPF: NONE SEEN (ref 0–?)
Specific Gravity, Urine: 1.02 (ref 1.000–1.030)
Total Protein, Urine: NEGATIVE
Urine Glucose: NEGATIVE
Urobilinogen, UA: 0.2 (ref 0.0–1.0)

## 2013-03-22 MED ORDER — LOSARTAN POTASSIUM-HCTZ 50-12.5 MG PO TABS: ORAL_TABLET | ORAL | Status: DC

## 2013-03-22 MED ORDER — LEVOTHYROXINE SODIUM 125 MCG PO TABS: ORAL_TABLET | ORAL | Status: DC

## 2013-03-22 NOTE — Patient Instructions (Addendum)
Today we updated your med list in our EPIC system...    Continue your current medications the same...  We decided to change the DiovanHCT to LOSARTAN-HCT 50-12.5 take 1/2 tab daily in AM...  Wear support hose when up & about (Rx written per request)...  We checked a urinalysis today & culture...    We will contact you w/ the results when available...   We will arrange for a Urology eval for your leakage/ incontinence in January after the holidays...  .caalf Let's plan a follow up visit in 3-4 mo w/ FASTING blood work at that time.Marland KitchenMarland Kitchen

## 2013-03-22 NOTE — Progress Notes (Addendum)
Subjective:    Patient ID: Kayla Davis, female    DOB: [birth year], 79 y.o.   MRN: [id number]  HPI 79 y/o WF here for a follow up visit... she has mult med problems including:  Recurrent bronchitic infections;  HBP;  Aortic valve dis;  Cardiac pacemaker;  Ven insuffic;  Hyperlipidemia;  DM;  Hypothyroid;  Divertics/ constip;  DJD/ LBP;  Osteopenia;  Hx TIA;  Periph neuropathy;  Anxiety;  Anemia; and hx Shingles...  ~  September 16, 2011:  37moROV & ETynesiais doing very well> she has a relative who is 77y/o still sharp/ ambulatory/ & makes her own clothes!  Her CC is LBP and neuropathy symptoms> she's received shots from DrRamos w/ help & restarted LYRICA 794mQhs per TP which really helps, she says;  Had UTI 4/13 treated & everything at baseline now...      We reviewed prob list, meds, xrays and labs> see below>> LABS 6/13:  FLP- at goals on Simva40;  Chems- ok w/ BS=86, A1c=6.5  CBC- ok x Hg=11.1;  TSH=2.41;  BNP=111...  ~  March 16, 2012:  50m21moV & Kayla Davis about the same, notes that it is hard to walk due to her leg discomfort, back hurts, knees hurt, etc; she refuses PT & we will not force the issue given her 89 36s... We reviewed the following medical problems during today's office visit >>     Hearing Loss> wears bilat hearing aides...    Hx Bronchitis> she has ZPak to keep on hand for prn use...    HBP> on DiovaHCT80-12.5; BP= 132/60 & she denies anginaCP, palpit, dizzy, ch in SOB, edema, etc...     Cards- CWP, AI, pacer> on ASA325; follwed by DrAllred & seen 11/13- stable, doing satis, pacer ok...    VenInsuffic> she knows to elim sodium, elev legs, wear support hose...    CHOL> on Simva40, FishOil, CoQ10; yearly FLP done 6/13 & all parameters at goals...    DM> on diet alone; yearly labs 6/13 were wnl- BS=86, A1c=6.5    Hypothy> on Synthroid125; yearly labs 6/13 confirmed euthyroid on this dose...    GI- Divertics, constip> on Miralax, Colace, Probiotics; she denies abd pain, n/v/d,  blood seen...    DJD, LBP, Osteopenia> on Fosamax70/wk, calcium, MVI, VitD1000; last BMD 9/11 [facility], Neuropathy> on ASA325, [id number] leg cramp med; she denies cerebral ischemic symptoms...    Anxiety> she declines anxiolytic Rx...    Anemia> Hg follow up = 11.7 w/ normal serum Fe etc... We reviewed prob list, meds, xrays and labs> see below for updates >> she had the 2013 Flu vaccine 9/13... LABS 12/13:  Chems- wnl;  CBC- mild anemia w/ Hg=11.7, MCV=94, Fe=83 (24%sat)...   ~  September 14, 2012:  50mo58mo & Kayla Davis been c/o LBP & getting ESI shots from DrRamos, has back brace & ambulates w/ a cane "I tough it out"... She also remains under the care of GYN- had pessary placed but she reports too uncomfortable & she took it out; she reports blood in urine treated w/ Ab & resolved=> we rechecked UA & C&S- clear, neg...     Hearing Loss> wears bilat hearing aides...    Hx Bronchitis> she has ZPak to keep on hand for prn use...    HBP> on DiovaHCT80-12.5; BP= 138/62 & she denies anginaCP, palpit, dizzy, ch in SOB, edema, etc...[name]  Cards- CWP, AI, pacer> on ASA325; followed by DrAllred & seen 2/14- stable, doing satis, pacer ok...    VenInsuffic> she knows to elim sodium, elev legs, wear support hose...    CHOL> on Simva40, FishOil, CoQ10; FLP 6/14 showed TChol 170, TG 83, HDL 59, LDL 95    DM> on diet alone; labs 6/14 were wnl- BS=101, A1c=6.6    Hypothy> on Synthroid125; labs 6/14 showed TSH= 5.24    GI- Divertics, constip> on Miralax, Colace, Probiotics; she denies abd pain, n/v/d, blood seen...    DJD, LBP, Osteopenia> on Fosamax70/wk, calcium, MVI, VitD1000; last BMD 9/11 showed TScore -1.7 in right [facility]    Hx TIA, Neuropathy> on ASA325, [id number], homeopathic leg cramp med; she denies cerebral ischemic symptoms...    Anxiety> she declines anxiolytic Rx...    Anemia> Hg follow up = 11.7 w/ normal serum Fe etc... We reviewed prob list,  meds, xrays and labs> see below for updates >>  Urinalysis C&S> Urine is clear, C&S in no growth... LABS 6/14:  FLP- at goals on Simva40;  Chems- wnl w/ BS=101 A1c=6.6;  TSH=5.24... CBC was ordered, not done...   ~  March 22, 2013:  37mo ROV & Kayla Davis's CC= fatigue but then she notes "I do my housework & keep on goin"...  She notes Urinary leakage incont esp when she wakes up & tries to get to the BR; occurs in the AM & after a nap; reminded to empty bladder thoroughly on retiring & NOT to drink fluids for the several hours before lying down; recall that she had bladder tach yrs ago; I have rec a Urology eval 7 she wants to wait until after the holidays...  She also noted swelling in her legs, she had increased her salt intake on her own & was prev switched from DiovanHCT to plain Losartan for $$ reasons; she says she had some old HCTZ at home & started on that w/ improvement in edema noted; we discussed options and decided to change to LOSARTAN-HCT 50-12.5 & start w/ 1/2 tab Qam...     We reviewed prob list, meds, xrays and labs> see below for updates >> she had the 2014 Flu vaccine 10/14 & requests several refills today; she still take s a lot of supplements...  ADDENDUM>> Urinalysis w/ +nitrite, wbc, bact> c/w UTI, she has mult antibiotic allergies and sensitivities, she can tol cipro- call in [id number] #[id number], await C&S...         Problem List:  HEARING LOSS (ICD-389.9) - she has bilat hearing aides & [name] has washed out her EACs...  Hx of BRONCHITIS, RECURRENT >> Hx Pneumonia >> ~  8/12: she had CAP in RLL treated w/ Avelox & slowly resolved back to baseline... ~  CXR 9/12 w/ sl pectus excavatum, mod thor spondylosis, mild cardiomeg, pacer, mild left base scarring, NAD...  HYPERTENSION (ICD-401.9) - controlled on DIOVAN/Hct 80-12.5 daily...  ~  4/12:  BP=120/58 and tol med well... she denies HA, fatigue, visual changes, CP, palipit, ch in dyspnea, edema, etc... ~  1/13:  BP= 142/66 & she  remains largely asymptomatic...  ~  6/13:  BP= 126/74 & feeling well w/o CP, palpit, dizzy, ch in SOB, edema; Creat=0.7, BNP=111. ~  6/14:  on DiovaHCT80-12.5; BP= 138/62 & she denies anginaCP, palpit, dizzy, ch in SOB, edema, etc. ~  12/14:  meds had been adjusted in the interval off diovan onto Losartan; she restarted some HCTZ she had at home; asked to throw this out 7  change to LOSARTAN-HCT 50-12.5 starting w/ 1/2 tab Qam...  CHEST WALL PAIN, HX OF (ICD-V15.89) - on ASA 325mg /d... AORTIC VALVE DISORDERS (ICD-424.1)- s/p cardiac work up in 2001... currently denies CP etc... ~  NuclearStressTest 3/01 showed impaired exerc capacity & ST depression but no ischemia or infarction on images and EF=72%... ~  2DEcho 10/06 showed no regional wall motion abn, EF= 70%, mild incr AV thickness w/ reduced excursion, mild AI, sl decr RV function. ~  2DEcho 5/11 showed norm LV size & function, EF=55-60%, mild AI w/ sl thickening of valve, mild MR, mod TR, grI DD...  PACER for Pre-syncope>  Hosp 10/11 for presyncope w/ LBBB & pauses ==> pacer placed & she is now stable, asymptomatic, & followed in the device clinic by DrAllred... ~  10/12: she had f/u DrAllred, doing well, normal pacer function; she was complaining of leg weakness she though due to statin rx & they held it for a month... ~  She maintains regular f/u w/ drAllred et al & pacer function is normal.... ~  EKG 10/14 showed atrial sensed & ventric paced rhythm...  ~  11/14: she had f/u DrAllred> doing satis w/ her SSS, Brady, normal pacer function, no changes made...  VENOUS INSUFFICIENCY (ICD-459.81) - she has mod VV, VI, & chr leg symptoms... good arterial pulses distally, no ischemic symptoms. ~  11/12:  Venous Dopplers done for leg pain were neg for DVT, no superfic thrombus or incompetence either... ~  12/14:  Reminded to follow low sodium diet, elev legs, wear support hose, 7 we changed her med to LosarHCT 50-12.5 starting w/ 1/2 tab  daily...  HYPERLIPIDEMIA (ICD-272.4) - on SIMVASTATIN 40mg /d & she has stopped it on several occas due to leg discomfort to see if this would help- it has never lead to improved leg symptoms!... also takes FISH OIL daily. ~  FLP 4/08 on Simvastatin 40mg /d showed TChol 171, TG 88, HDL 58, LDL 96... ~  FLP 6/09 showed TChol 220, TG 98, HDL 58, LDL 143... rec to restart her Simvast40. ~  10/10: not fasting at present & we discussed f/u FLP on ret in 2011 ~  FLP 4/11 showed TChol 176, TG 81, HDL 65, LDL 95 ~  FLP 4/12 showed TChol 189, TG 103, HDL 70, LDL 99 ~  FLP 6/13 on Simva40 showed TChol 161, TG 123, HDL 63, LDL 73 ~  FLP 6/14 on simva40 showed TChol 170, TG 83, HDL 59, LDL 95   DIABETES MELLITUS, BORDERLINE (ICD-790.29) - on diet alone...  ~  labs 4/08 w/ BS= 106... ~  labs 2/09 w/ BS= 170, HgA1c= 6.7.Marland KitchenMarland Kitchen rec to start Metformin 500mg /d but she declined, prefer diet... ~  labs 6/09 showed BS= 97, HgA1c= 6.6.Marland KitchenMarland Kitchen rec- continue diet Rx. ~  labs 3/10 showed BS= 129, A1c= 6.6 ~  labs 9/10 showed BS= 108 ~  labs 4/11 showed BS= 96 ~  Labs 4/12 showed BS= 89 ~  Labs 6/13 on diet alone showed BS= 86, A1c= 6.5 ~  Labs 6/14 on diet alone showed BS=101, A1c=6.6  ~  9/14: Ophthalmology note from drStoneburner> no DM retinopathy....  HYPOTHYROIDISM (ICD-244.9) - on SYNTHROID 131mcg/d... ~  TSH 4/08 = 1.45... keep same dose. ~  TSH 2/09 = 0.17 on 178mcg/d dose... rec to decr to 169mcg/d... ~  TSH 6/09 = 6.20 on 168mcg/d dose... she wants ret to dose. ~  TSH 3/10 = 0.41 on 160mcg/d dose. ~  TSH 4/11 = 0.24 on 16mcg/d dose.Marland KitchenMarland Kitchen  She doesn't want to decr the dose. ~  TSH 4/12 = 0.83 on 146mcg/d dose ~  TSH 6/13 = 2.41 on 172mcg/d ~  Labs 6/14 on Synthroid125 showed TSH= 5.24  DIVERTICULOSIS OF COLON (ICD-562.10) & CONSTIPATION (ICD-564.00) - we discussed a regimen to prevent constipation & she uses MIRALAX Prn... ~  Flex Sig 6/96 by DrSam showed divertics, otherw normal left colon...  URINARY  INCONTINENCE/ LEAKAGE >>  ~  12/14: she mentioned this symptom including  DEGENERATIVE JOINT DISEASE (ICD-715.90) - she takes Glucosamine/ Chondroitin... LOW BACK PAIN & ?Neurogenic claudication in legs> ~  she persisits w/ mult somatic complaints--- she states she has tried off Simvastatin, Lyrica, Fosamax & others but nothing helps and her symptoms persist... XRays showed mild DJD both hips & lower spine; & BMD 5/09 showed min osteopenia w/ TScores -0.0 in Spine & -1.4 in right FemNeck... she has tried Rx w/ Tramadol, Advil, Osteobiflex, heat, etc...  ~  eval by Ortho- DrRamos w/ LBP, multilevel DDD, spondylolisthesis L5-S1 & bilat leg pain> she's tried phys therapy, water exercises, meds (no benefit); epid steroid shots have helped some... ~  6/13:  Pt back on LYRICA 75mg  Qhs at her request for neuropathy & improved, she says... ~  12/14: she is taking Lyrica75Bid per DrRamos & tol well, but oddly she can't tol Tramadol50 "it made me feel funny for a short while"...  OSTEOPOROSIS (ICD-733.00) - on ALENDRONATE 70mg /wk, calcium, vitamins... ~  BMD here 5/09 showed TScores of 0.00 in Spine, and -1.4 in the right Fem Neck ~  BMD here 9/11 showed TScores of +0.8 in Spine, and -1.7 in the right [facility]... rec to continue same med.  Hx of TRANSIENT ISCHEMIC ATTACK (ICD-435.9) - she remains on ASA and has not had any new cerebral ischemic symptoms...  PERIPHERAL NEUROPATHY (ICD-356.9) - c/o chronic leg discomfort as noted... extensive work up in the past- DrStuckey 2001 w/ normal art dopplers; DrLove in 2001 w/ a neg NCV study;  DrAplington w/ meds and TENS trial (per her request- it helped her brother)... now DrRamos w/ PT, water exercises, and epid shots... tried Lyrica and now she thinks it's helping...  ANXIETY (ICD-300.00) - she declines anxiolytic Rx.  SHINGLES (ICD-053.9) - developed shingles Mar09 involving right sacral dermatomes w/ rash and pain in the perineal and buttock areas... treated  via ER & several visits w/ NP w/ Valtrex, Pred, Lyrica, Vicodin... lesions resolved, some persist post-herpetic neuralgia...  ANEMIA, MILD (ICD-285.9) ~  labs 9/09 showed Hg= 11.8.[name]. ~  labs 3/10 showed Hg= 11.6, Fe= 64 (sat=20%) ~  labs 4/11 showed = Hg= 11.4 ~  Labs 4/12 showed Hg= 11.9 ~  Labs 6/13 showed Hg= 11.1, MCV=95  HEALTH MAINTENANCE:  she had the 2010 Flu shot 9/10... PNEUMOVAX in 2006 age age 89...   Past Surgical History  Procedure Laterality Date  . Pacemaker insertion      MDT  . Appendectomy    . Cholecystectomy    . Tonsillectomy    . Vaginal hysterectomy    . Cataract extraction, bilateral  end of June 2013  . Cataract extraction, bilateral  beginning of July 2013    Outpatient Encounter Prescriptions as of 03/22/2013  Medication Sig  . alendronate (FOSAMAX) 70 MG tablet TAKE 1 TABLET EVERY 7 DAYS.TAKE WITH A FULL GLASS OF  WATER ON AN EMPTY STOMACH  . Alpha-Lipoic Acid 200 MG CAPS Take 1 capsule by mouth daily.   [name] aspirin 325 MG tablet Take 325 mg  by mouth daily.    . Calcium Carbonate-Vitamin D (CALTRATE 600+D) 600-400 MG-UNIT per tablet Take 1 tablet by mouth daily.    . cholecalciferol (VITAMIN D) 1000 UNITS tablet Take 1,000 Units by mouth daily.    [name] co-enzyme Q-10 30 MG capsule Take 30 mg by mouth daily.    . Cyanocobalamin (B-12) 2500 MCG TABS Take 1 tablet by mouth daily.   [name] docusate sodium (COLACE) 100 MG capsule Take 100 mg by mouth at bedtime.   . fish oil-omega-3 fatty acids 1000 MG capsule Take 1 g by mouth daily.    . Homeopathic Products ([employer] LEG CRAMPS PAIN RELIEF PO) 3 tablets a day as needed for leg cramps  . levothyroxine (SYNTHROID) 125 MCG tablet TAKE 1 TABLET DAILY  . Misc Natural Products (OSTEO BI-FLEX ADV DOUBLE ST) TABS Take 1 tablet by mouth 2 (two) times daily.  . polyethylene glycol (MIRALAX / GLYCOLAX) packet Take 17 g by mouth as needed.  . pregabalin (LYRICA) 75 MG capsule Take 150 mg by mouth at bedtime.  . Probiotic  Product (PROBIOTIC FORMULA) CAPS Take 1 capsule by mouth daily.    . simvastatin (ZOCOR) 40 MG tablet Take 40 mg by mouth at bedtime.  . [DISCONTINUED] pregabalin (LYRICA) 75 MG capsule Take 1 capsule (75 mg total) by mouth at bedtime.  [name] losartan (COZAAR) 100 MG tablet Take 1 tablet (100 mg total) by mouth daily.    Allergies  Allergen Reactions  . Penicillins Anaphylaxis    REACTION: swells up all over body, pt states she almost died  . Alprazolam      REACTION: eyes swell up  . Bactrim [Sulfamethoxazole-Trimethoprim]     Unable to remember the side effects  . Ceclor [Cefaclor]     Unsure of the side effects  . Cephalexin     Unsure of the side effects  . Clindamycin/Lincomycin     Upset stomach  . Hydrocodone     Unsure of reaction to med  . Noroxin [Norfloxacin]     Unsure of side effects  . Sulfonamide Derivatives     REACTION: itching  . Tetracyclines & Related     Unsure of the reaction to this medication    Current Medications, Allergies, Past Medical History, Past Surgical History, Family History, and Social History were reviewed in [employer] record.    Review of Systems         See HPI - all other systems neg except as noted... The patient complains of decreased hearing, dyspnea on exertion, and difficulty walking.  The patient denies anorexia, fever, weight loss, weight gain, vision loss, hoarseness, chest pain, syncope, peripheral edema, prolonged cough, headaches, hemoptysis, abdominal pain, melena, hematochezia, severe indigestion/heartburn, hematuria, incontinence, muscle weakness, suspicious skin lesions, transient blindness, depression, unusual weight change, abnormal bleeding, enlarged lymph nodes, and angioedema.    Objective:   Physical Exam      WD, WN, 79 y/o WF in NAD;  VS- reviewed. GENERAL:  Alert & oriented; pleasant & cooperative... HEENT:  [place]/AT, EOM-full, PERRLA, EACs-clear (she has bilat hearing aides), TMs-wnl,  NOSE-clear, THROAT-clear & wnl. NECK:  Supple w/ fair ROM; no JVD; normal carotid impulses w/o bruits; no thyromegaly or nodules palpated; no lymphadenopathy. CHEST:  Clear to P & A; without wheezes/ rales/ or rhonchi heard; pacer in right upper shoulder area. HEART:  Regular gr 2/6 SEM S4 without rubs... ABDOMEN:  Soft & nontender; normal bowel sounds; no organomegaly or masses detected. EXT: without  deformities, mild arthritic changes; +varicose veins/ +venous insuffic/ no edema. NEURO:  CN's intact; no focal neuro deficits x decr sensation in LE's c/w periph neuropathy.  DERM:  No lesions noted; no rash etc...  RADIOLOGY DATA:  Reviewed in the EPIC EMR & discussed w/ the patient...  LABORATORY DATA:  Reviewed in the EPIC EMR & discussed w/ the patient...   Assessment & Plan:    Hx of PNEUMONIA>  Hx RLL pneumonia- resolved, underlying COPD, hx recurrent bronchitis...  HBP>  Controlled on Diovan/HCT=> changed to LosarHCT 50-12.5 start w/ 1/2 tab Qam...  CARDIAC>  Hx CWP, AoV dis, Pacer;  Stable now & doing well w/o symptoms;  She will continue f/u in Pacer clinic...  LIPIDS>  On Simva40 + CoQ10;  FLP is at goals & continue same...  DM>  On diet alone;  Labs have beed good & A1c= 6.6.Marland KitchenMarland Kitchen  HYPOTHYROID>  Stable on the Synth125, continue same...  ORTHO>  She has DJD, LBP, leg pain> she continues f/u by DrRamos for shots prn, doesn't want pain meds, we reviewed her predicament...  Noct leg pain>  This seems like an additional prob & rec to try tonic water, mustard, & the homeopathic quinine prep that her son found for her to put under her tongue...  Other medical problems as noted...   Patient's Medications  New Prescriptions   LOSARTAN-HYDROCHLOROTHIAZIDE (HYZAAR) 50-12.5 MG PER TABLET    Take 1/2 tablet by mouth every morning  Previous Medications   ALENDRONATE (FOSAMAX) 70 MG TABLET    TAKE 1 TABLET EVERY 7 DAYS.TAKE WITH A FULL GLASS OF  WATER ON AN EMPTY STOMACH    ALPHA-LIPOIC ACID 200 MG CAPS    Take 1 capsule by mouth daily.    ASPIRIN 325 MG TABLET    Take 325 mg by mouth daily.     CALCIUM CARBONATE-VITAMIN D (CALTRATE 600+D) 600-400 MG-UNIT PER TABLET    Take 1 tablet by mouth daily.     CHOLECALCIFEROL (VITAMIN D) 1000 UNITS TABLET    Take 1,000 Units by mouth daily.     CO-ENZYME Q-10 30 MG CAPSULE    Take 30 mg by mouth daily.     CYANOCOBALAMIN (B-12) 2500 MCG TABS    Take 1 tablet by mouth daily.    DOCUSATE SODIUM (COLACE) 100 MG CAPSULE    Take 100 mg by mouth at bedtime.    FISH OIL-OMEGA-3 FATTY ACIDS 1000 MG CAPSULE    Take 1 g by mouth daily.     HOMEOPATHIC PRODUCTS ([facility] LEG CRAMPS PAIN RELIEF PO)    3 tablets a day as needed for leg cramps   MISC NATURAL PRODUCTS (OSTEO BI-FLEX ADV DOUBLE ST) TABS    Take 1 tablet by mouth 2 (two) times daily.   POLYETHYLENE GLYCOL (MIRALAX / GLYCOLAX) PACKET    Take 17 g by mouth as needed.   PROBIOTIC PRODUCT (PROBIOTIC FORMULA) CAPS    Take 1 capsule by mouth daily.     SIMVASTATIN (ZOCOR) 40 MG TABLET    Take 40 mg by mouth at bedtime.  Modified Medications   Modified Medication Previous Medication   LEVOTHYROXINE (SYNTHROID) 125 MCG TABLET levothyroxine (SYNTHROID) 125 MCG tablet      TAKE 1 TABLET DAILY    TAKE 1 TABLET DAILY   PREGABALIN (LYRICA) 75 MG CAPSULE pregabalin (LYRICA) 75 MG capsule      Take 150 mg by mouth at bedtime.    Take 1 capsule (75 mg total) by  mouth at bedtime.  Discontinued Medications   LOSARTAN (COZAAR) 100 MG TABLET    Take 1 tablet (100 mg total) by mouth daily.

## 2013-03-23 ENCOUNTER — Other Ambulatory Visit: Payer: Self-pay | Admitting: Pulmonary Disease

## 2013-03-23 MED ORDER — CIPROFLOXACIN HCL 250 MG PO TABS: 250.0000 mg | ORAL_TABLET | Freq: Two times a day (BID) | ORAL | Status: DC

## 2013-03-24 ENCOUNTER — Telehealth: Payer: Self-pay | Admitting: Pulmonary Disease

## 2013-03-24 LAB — URINE CULTURE: Colony Count: >=100000

## 2013-03-24 MED ORDER — CIPROFLOXACIN HCL 250 MG PO TABS: 250.0000 mg | ORAL_TABLET | Freq: Two times a day (BID) | ORAL | Status: DC

## 2013-03-24 NOTE — Telephone Encounter (Signed)
Rx has been sent to correct pharmacy. Pt is aware.

## 2013-03-25 ENCOUNTER — Telehealth: Payer: Self-pay | Admitting: Pulmonary Disease

## 2013-03-25 NOTE — Telephone Encounter (Signed)
I called and spoke with Dois Davenport from [facility]. She reports pt told them she was sensitive to HCT. I advised her pt has been on diovan HCT and we were changing losartan HCT. Nothing further needed

## 2013-04-01 ENCOUNTER — Telehealth: Payer: Self-pay | Admitting: Pulmonary Disease

## 2013-04-01 NOTE — Telephone Encounter (Signed)
Called and spoke with pt and she is aware of lab results per SN. Pt voiced her understanding and nothing further is needed 

## 2013-04-11 ENCOUNTER — Telehealth: Payer: Self-pay | Admitting: Pulmonary Disease

## 2013-04-11 MED ORDER — AZITHROMYCIN 250 MG PO TABS: ORAL_TABLET | ORAL | Status: DC

## 2013-04-11 NOTE — Telephone Encounter (Signed)
Spoke with pt. She c/o prod cough w/ white phlem, chest congestion, head feels heavy at times, nausea. Denies any fever, no chills, no sweats, no vomiting, no wheezing, no chest tx. She has been taking tylenol, Claritin, and mucinex. Pt wants something called in for her. Please advise SN thanks  Allergies  Allergen Reactions  . Penicillins Anaphylaxis    REACTION: swells up all over body, pt states she almost died  . Alprazolam      REACTION: eyes swell up  . Bactrim [Sulfamethoxazole-Trimethoprim]     Unable to remember the side effects  . Ceclor [Cefaclor]     Unsure of the side effects  . Cephalexin     Unsure of the side effects  . Clindamycin/Lincomycin     Upset stomach  . Hydrocodone     Unsure of reaction to med  . Noroxin [Norfloxacin]     Unsure of side effects  . Sulfonamide Derivatives     REACTION: itching  . Tetracyclines & Related     Unsure of the reaction to this medication    Current Outpatient Prescriptions on File Prior to Visit  Medication Sig Dispense Refill  . alendronate (FOSAMAX) 70 MG tablet TAKE 1 TABLET EVERY 7 DAYS.TAKE WITH A FULL GLASS OF  WATER ON AN EMPTY STOMACH  12 tablet  3  . Alpha-Lipoic Acid 200 MG CAPS Take 1 capsule by mouth daily.       [name] aspirin 325 MG tablet Take 325 mg by mouth daily.        . Calcium Carbonate-Vitamin D (CALTRATE 600+D) 600-400 MG-UNIT per tablet Take 1 tablet by mouth daily.        . cholecalciferol (VITAMIN D) 1000 UNITS tablet Take 1,000 Units by mouth daily.        . ciprofloxacin (CIPRO) 250 MG tablet Take 1 tablet (250 mg total) by mouth 2 (two) times daily.  14 tablet  0  . co-enzyme Q-10 30 MG capsule Take 30 mg by mouth daily.        . Cyanocobalamin (B-12) 2500 MCG TABS Take 1 tablet by mouth daily.       [name] docusate sodium (COLACE) 100 MG capsule Take 100 mg by mouth at bedtime.       . fish oil-omega-3 fatty acids 1000 MG capsule Take 1 g by mouth daily.        . Homeopathic Products ([facility] LEG CRAMPS PAIN  RELIEF PO) 3 tablets a day as needed for leg cramps      . levothyroxine (SYNTHROID) 125 MCG tablet TAKE 1 TABLET DAILY  90 tablet  3  . losartan-hydrochlorothiazide (HYZAAR) 50-12.5 MG per tablet Take 1/2 tablet by mouth every morning  45 tablet  3  . Misc Natural Products (OSTEO BI-FLEX ADV DOUBLE ST) TABS Take 1 tablet by mouth 2 (two) times daily.      . polyethylene glycol (MIRALAX / GLYCOLAX) packet Take 17 g by mouth as needed.      . pregabalin (LYRICA) 75 MG capsule Take 150 mg by mouth at bedtime.      . Probiotic Product (PROBIOTIC FORMULA) CAPS Take 1 capsule by mouth daily.        . simvastatin (ZOCOR) 40 MG tablet Take 40 mg by mouth at bedtime.      . [DISCONTINUED] valsartan-hydrochlorothiazide (DIOVAN-HCT) 80-12.5 MG per tablet TAKE 1 TABLET BY MOUTH DAILY  90 tablet  3   No current facility-administered medications on file prior to visit.

## 2013-04-11 NOTE — Telephone Encounter (Signed)
Per SN: call in ZPAK, mucinex 2 po BID, increase fluids, delsym OTC.   Pt aware of recs and RX has been sent. Nothing further needed

## 2013-04-19 DIAGNOSIS — N3941 Urge incontinence: Secondary | ICD-10-CM | POA: Diagnosis not present

## 2013-04-19 DIAGNOSIS — N390 Urinary tract infection, site not specified: Secondary | ICD-10-CM | POA: Diagnosis not present

## 2013-04-21 ENCOUNTER — Telehealth: Payer: Self-pay | Admitting: Pulmonary Disease

## 2013-04-21 MED ORDER — ALENDRONATE SODIUM 70 MG PO TABS: ORAL_TABLET | ORAL | Status: DC

## 2013-04-21 NOTE — Telephone Encounter (Signed)
RX has been sent to [facility]. Nothing further needed

## 2013-04-22 ENCOUNTER — Telehealth: Payer: Self-pay | Admitting: Pulmonary Disease

## 2013-04-22 NOTE — Telephone Encounter (Signed)
Per Leigh: Pt needs to contact her urologist regarding this. That is why he referred her to urology.  I called pt and made her aware. She reports she will call them and let them know. Nothing further needed

## 2013-04-29 ENCOUNTER — Inpatient Hospital Stay (HOSPITAL_COMMUNITY)
Admission: EM | Admit: 2013-04-29 | Discharge: 2013-05-02 | DRG: 194 | Disposition: A | Discharge status: Home Health | Payer: MEDICARE | Attending: Internal Medicine | Admitting: Internal Medicine

## 2013-04-29 ENCOUNTER — Encounter (HOSPITAL_COMMUNITY): Payer: Self-pay | Admitting: Emergency Medicine

## 2013-04-29 ENCOUNTER — Ambulatory Visit (INDEPENDENT_AMBULATORY_CARE_PROVIDER_SITE_OTHER): Payer: MEDICARE | Admitting: Adult Health

## 2013-04-29 ENCOUNTER — Other Ambulatory Visit (INDEPENDENT_AMBULATORY_CARE_PROVIDER_SITE_OTHER): Payer: MEDICARE

## 2013-04-29 ENCOUNTER — Ambulatory Visit (INDEPENDENT_AMBULATORY_CARE_PROVIDER_SITE_OTHER)
Admission: RE | Admit: 2013-04-29 | Discharge: 2013-04-29 | Disposition: A | Discharge status: Home / Self Care | Payer: MEDICARE | Source: Ambulatory Visit | Attending: Adult Health | Admitting: Adult Health

## 2013-04-29 ENCOUNTER — Encounter: Payer: Self-pay | Admitting: Adult Health

## 2013-04-29 VITALS — BP 108/56 | HR 75 | Temp 98.2°F | Ht 64.0 in | Wt 150.8 lb

## 2013-04-29 DIAGNOSIS — R0781 Pleurodynia: Secondary | ICD-10-CM

## 2013-04-29 DIAGNOSIS — J189 Pneumonia, unspecified organism: Secondary | ICD-10-CM | POA: Diagnosis not present

## 2013-04-29 DIAGNOSIS — I10 Essential (primary) hypertension: Secondary | ICD-10-CM | POA: Diagnosis present

## 2013-04-29 DIAGNOSIS — F411 Generalized anxiety disorder: Secondary | ICD-10-CM | POA: Diagnosis not present

## 2013-04-29 DIAGNOSIS — I498 Other specified cardiac arrhythmias: Secondary | ICD-10-CM | POA: Diagnosis present

## 2013-04-29 DIAGNOSIS — R0602 Shortness of breath: Secondary | ICD-10-CM | POA: Diagnosis not present

## 2013-04-29 DIAGNOSIS — R079 Chest pain, unspecified: Secondary | ICD-10-CM

## 2013-04-29 DIAGNOSIS — R197 Diarrhea, unspecified: Secondary | ICD-10-CM

## 2013-04-29 DIAGNOSIS — R059 Cough, unspecified: Secondary | ICD-10-CM

## 2013-04-29 DIAGNOSIS — J90 Pleural effusion, not elsewhere classified: Secondary | ICD-10-CM | POA: Diagnosis not present

## 2013-04-29 DIAGNOSIS — R05 Cough: Secondary | ICD-10-CM | POA: Diagnosis not present

## 2013-04-29 DIAGNOSIS — R5383 Other fatigue: Secondary | ICD-10-CM

## 2013-04-29 DIAGNOSIS — Z833 Family history of diabetes mellitus: Secondary | ICD-10-CM

## 2013-04-29 DIAGNOSIS — R5381 Other malaise: Secondary | ICD-10-CM | POA: Diagnosis not present

## 2013-04-29 DIAGNOSIS — M549 Dorsalgia, unspecified: Secondary | ICD-10-CM

## 2013-04-29 DIAGNOSIS — R0989 Other specified symptoms and signs involving the circulatory and respiratory systems: Secondary | ICD-10-CM | POA: Diagnosis not present

## 2013-04-29 DIAGNOSIS — M810 Age-related osteoporosis without current pathological fracture: Secondary | ICD-10-CM | POA: Diagnosis present

## 2013-04-29 DIAGNOSIS — E785 Hyperlipidemia, unspecified: Secondary | ICD-10-CM | POA: Diagnosis present

## 2013-04-29 DIAGNOSIS — W19XXXA Unspecified fall, initial encounter: Secondary | ICD-10-CM | POA: Diagnosis present

## 2013-04-29 DIAGNOSIS — Z880 Allergy status to penicillin: Secondary | ICD-10-CM | POA: Diagnosis not present

## 2013-04-29 DIAGNOSIS — R946 Abnormal results of thyroid function studies: Secondary | ICD-10-CM | POA: Diagnosis present

## 2013-04-29 DIAGNOSIS — N390 Urinary tract infection, site not specified: Secondary | ICD-10-CM | POA: Diagnosis not present

## 2013-04-29 DIAGNOSIS — Z7982 Long term (current) use of aspirin: Secondary | ICD-10-CM | POA: Diagnosis not present

## 2013-04-29 DIAGNOSIS — Z8249 Family history of ischemic heart disease and other diseases of the circulatory system: Secondary | ICD-10-CM

## 2013-04-29 DIAGNOSIS — H9190 Unspecified hearing loss, unspecified ear: Secondary | ICD-10-CM | POA: Diagnosis present

## 2013-04-29 DIAGNOSIS — E039 Hypothyroidism, unspecified: Secondary | ICD-10-CM | POA: Diagnosis present

## 2013-04-29 DIAGNOSIS — R531 Weakness: Secondary | ICD-10-CM | POA: Diagnosis present

## 2013-04-29 DIAGNOSIS — M47817 Spondylosis without myelopathy or radiculopathy, lumbosacral region: Secondary | ICD-10-CM | POA: Diagnosis not present

## 2013-04-29 DIAGNOSIS — M5137 Other intervertebral disc degeneration, lumbosacral region: Secondary | ICD-10-CM | POA: Diagnosis not present

## 2013-04-29 DIAGNOSIS — Z950 Presence of cardiac pacemaker: Secondary | ICD-10-CM | POA: Diagnosis not present

## 2013-04-29 DIAGNOSIS — I495 Sick sinus syndrome: Secondary | ICD-10-CM | POA: Diagnosis present

## 2013-04-29 LAB — BASIC METABOLIC PANEL
BUN: 16 mg/dL (ref 6–23)
BUN: 18 mg/dL (ref 6–23)
CALCIUM: 8.9 mg/dL (ref 8.4–10.5)
CHLORIDE: 100 meq/L (ref 96–112)
CO2: 26 mEq/L (ref 19–32)
CO2: 24 mEq/L (ref 19–32)
Calcium: 8.7 mg/dL (ref 8.4–10.5)
Chloride: 96 mEq/L (ref 96–112)
Creatinine, Ser: 0.8 mg/dL (ref 0.4–1.2)
Creatinine, Ser: 0.69 mg/dL (ref 0.50–1.10)
GFR calc Af Amer: 87 mL/min — ABNORMAL LOW (ref >90)
GFR, EST NON AFRICAN AMERICAN: 75 mL/min — AB (ref >90)
GFR: 76.27 mL/min (ref >60.00)
GLUCOSE: 175 mg/dL — AB (ref 70–99)
Glucose, Bld: 163 mg/dL — ABNORMAL HIGH (ref 70–99)
Potassium: 3.4 mEq/L — ABNORMAL LOW (ref 3.5–5.1)
Potassium: 3.4 mEq/L — ABNORMAL LOW (ref 3.7–5.3)
SODIUM: 134 meq/L — AB (ref 137–147)
Sodium: 134 mEq/L — ABNORMAL LOW (ref 135–145)

## 2013-04-29 LAB — URINALYSIS, ROUTINE W REFLEX MICROSCOPIC
BILIRUBIN URINE: NEGATIVE
Glucose, UA: NEGATIVE mg/dL
Hgb urine dipstick: NEGATIVE
Ketones, ur: NEGATIVE mg/dL
Nitrite: NEGATIVE
PROTEIN: NEGATIVE mg/dL
Specific Gravity, Urine: 1.013 (ref 1.005–1.030)
UROBILINOGEN UA: 0.2 mg/dL (ref 0.0–1.0)
pH: 6.5 (ref 5.0–8.0)

## 2013-04-29 LAB — CBC WITH DIFFERENTIAL/PLATELET
BASOS ABS: 0 10*3/uL (ref 0.0–0.1)
BASOS PCT: 0.3 % (ref 0.0–3.0)
Basophils Absolute: 0.1 10*3/uL (ref 0.0–0.1)
Basophils Relative: 1 % (ref 0–1)
EOS PCT: 12.6 % — AB (ref 0.0–5.0)
EOS PCT: 14 % — AB (ref 0–5)
Eosinophils Absolute: 1.4 10*3/uL — ABNORMAL HIGH (ref 0.0–0.7)
Eosinophils Absolute: 1.6 10*3/uL — ABNORMAL HIGH (ref 0.0–0.7)
HCT: 32.6 % — ABNORMAL LOW (ref 36.0–46.0)
HCT: 31.9 % — ABNORMAL LOW (ref 36.0–46.0)
HEMOGLOBIN: 11 g/dL — AB (ref 12.0–15.0)
Hemoglobin: 11 g/dL — ABNORMAL LOW (ref 12.0–15.0)
LYMPHS ABS: 1.9 10*3/uL (ref 0.7–4.0)
LYMPHS PCT: 17.5 % (ref 12.0–46.0)
Lymphocytes Relative: 16 % (ref 12–46)
Lymphs Abs: 1.9 10*3/uL (ref 0.7–4.0)
MCH: 31.7 pg (ref 26.0–34.0)
MCHC: 33.8 g/dL (ref 30.0–36.0)
MCHC: 34.5 g/dL (ref 30.0–36.0)
MCV: 92.9 fl (ref 78.0–100.0)
MCV: 91.9 fL (ref 78.0–100.0)
Monocytes Absolute: 0.6 10*3/uL (ref 0.1–1.0)
Monocytes Absolute: 0.9 10*3/uL (ref 0.1–1.0)
Monocytes Relative: 5.1 % (ref 3.0–12.0)
Monocytes Relative: 7 % (ref 3–12)
NEUTROS ABS: 7 10*3/uL (ref 1.4–7.7)
Neutro Abs: 7.5 10*3/uL (ref 1.7–7.7)
Neutrophils Relative %: 64.5 % (ref 43.0–77.0)
Neutrophils Relative %: 63 % (ref 43–77)
PLATELETS: 234 10*3/uL (ref 150–400)
Platelets: 252 10*3/uL (ref 150.0–400.0)
RBC: 3.51 Mil/uL — AB (ref 3.87–5.11)
RBC: 3.47 MIL/uL — AB (ref 3.87–5.11)
RDW: 13.4 % (ref 11.5–14.6)
RDW: 12.9 % (ref 11.5–15.5)
WBC: 10.9 10*3/uL — AB (ref 4.5–10.5)
WBC: 12.1 10*3/uL — ABNORMAL HIGH (ref 4.0–10.5)

## 2013-04-29 LAB — URINE MICROSCOPIC-ADD ON

## 2013-04-29 MED ORDER — GUAIFENESIN-DM 100-10 MG/5ML PO SYRP: 5.0000 mL | ORAL_SOLUTION | ORAL | Status: DC | PRN

## 2013-04-29 MED ORDER — POTASSIUM CHLORIDE CRYS ER 20 MEQ PO TBCR
40.0000 meq | EXTENDED_RELEASE_TABLET | Freq: Once | ORAL | Status: AC
  Administered 2013-04-29: 40 meq via ORAL
  Filled 2013-04-29: qty 2

## 2013-04-29 MED ORDER — FLORA-Q PO CAPS
1.0000 | ORAL_CAPSULE | Freq: Every day | ORAL | Status: DC
  Administered 2013-04-30 – 2013-05-02 (×3): 1 via ORAL
  Filled 2013-04-29 (×3): qty 1

## 2013-04-29 MED ORDER — PREGABALIN 75 MG PO CAPS
75.0000 mg | ORAL_CAPSULE | Freq: Every day | ORAL | Status: DC
  Administered 2013-04-30 – 2013-05-01 (×2): 75 mg via ORAL
  Filled 2013-04-29 (×3): qty 1

## 2013-04-29 MED ORDER — HEPARIN SODIUM (PORCINE) 5000 UNIT/ML IJ SOLN
5000.0000 [IU] | Freq: Three times a day (TID) | INTRAMUSCULAR | Status: DC
  Administered 2013-04-29 – 2013-05-01 (×7): 5000 [IU] via SUBCUTANEOUS
  Filled 2013-04-29 (×11): qty 1

## 2013-04-29 MED ORDER — LOSARTAN POTASSIUM-HCTZ 50-12.5 MG PO TABS: 1.0000 | ORAL_TABLET | Freq: Every day | ORAL | Status: DC

## 2013-04-29 MED ORDER — LOSARTAN POTASSIUM 50 MG PO TABS
50.0000 mg | ORAL_TABLET | Freq: Every day | ORAL | Status: DC
  Administered 2013-04-30 – 2013-05-02 (×3): 50 mg via ORAL
  Filled 2013-04-29 (×3): qty 1

## 2013-04-29 MED ORDER — POLYETHYLENE GLYCOL 3350 17 G PO PACK
17.0000 g | PACK | Freq: Every day | ORAL | Status: DC | PRN
  Administered 2013-04-30 – 2013-05-01 (×2): 17 g via ORAL
  Filled 2013-04-29 (×2): qty 1

## 2013-04-29 MED ORDER — HYDROCHLOROTHIAZIDE 12.5 MG PO CAPS
12.5000 mg | ORAL_CAPSULE | Freq: Every day | ORAL | Status: DC
  Administered 2013-04-30 – 2013-05-02 (×3): 12.5 mg via ORAL
  Filled 2013-04-29 (×3): qty 1

## 2013-04-29 MED ORDER — SIMVASTATIN 40 MG PO TABS
40.0000 mg | ORAL_TABLET | Freq: Every day | ORAL | Status: DC
  Administered 2013-04-30 – 2013-05-01 (×2): 40 mg via ORAL
  Filled 2013-04-29 (×4): qty 1

## 2013-04-29 MED ORDER — SODIUM CHLORIDE 0.9 % IV SOLN
1.0000 g | Freq: Two times a day (BID) | INTRAVENOUS | Status: DC
  Administered 2013-04-29 – 2013-05-02 (×6): 1 g via INTRAVENOUS
  Filled 2013-04-29 (×8): qty 1

## 2013-04-29 MED ORDER — AZITHROMYCIN 500 MG IV SOLR
500.0000 mg | Freq: Every day | INTRAVENOUS | Status: DC
  Administered 2013-04-29 – 2013-05-01 (×3): 500 mg via INTRAVENOUS
  Filled 2013-04-29 (×4): qty 500

## 2013-04-29 MED ORDER — ONDANSETRON HCL 4 MG/2ML IJ SOLN: 4.0000 mg | Freq: Four times a day (QID) | INTRAMUSCULAR | Status: DC | PRN

## 2013-04-29 MED ORDER — POTASSIUM CHLORIDE IN NACL 20-0.9 MEQ/L-% IV SOLN
INTRAVENOUS | Status: AC
  Administered 2013-04-29 – 2013-04-30 (×2): via INTRAVENOUS
  Filled 2013-04-29 (×2): qty 1000

## 2013-04-29 MED ORDER — ONDANSETRON HCL 4 MG PO TABS: 4.0000 mg | ORAL_TABLET | Freq: Four times a day (QID) | ORAL | Status: DC | PRN

## 2013-04-29 MED ORDER — HYDROCODONE-ACETAMINOPHEN 5-325 MG PO TABS
1.0000 | ORAL_TABLET | ORAL | Status: DC | PRN
  Filled 2013-04-29: qty 2

## 2013-04-29 MED ORDER — DOCUSATE SODIUM 100 MG PO CAPS
200.0000 mg | ORAL_CAPSULE | Freq: Every day | ORAL | Status: DC
  Administered 2013-04-30 – 2013-05-01 (×2): 200 mg via ORAL
  Filled 2013-04-29 (×4): qty 2

## 2013-04-29 MED ORDER — LEVOTHYROXINE SODIUM 125 MCG PO TABS
125.0000 ug | ORAL_TABLET | Freq: Every day | ORAL | Status: DC
  Administered 2013-04-30 – 2013-05-02 (×3): 125 ug via ORAL
  Filled 2013-04-29 (×4): qty 1

## 2013-04-29 MED ORDER — LEVOFLOXACIN 500 MG PO TABS: 500.0000 mg | ORAL_TABLET | Freq: Every day | ORAL | Status: DC

## 2013-04-29 MED ORDER — ASPIRIN 325 MG PO TABS
325.0000 mg | ORAL_TABLET | Freq: Every day | ORAL | Status: DC
  Administered 2013-04-30 – 2013-05-02 (×3): 325 mg via ORAL
  Filled 2013-04-29 (×3): qty 1

## 2013-04-29 NOTE — H&P (Signed)
[facility]                                                                                    Patient Demographics  Kayla Davis, is a 80 y.o. female  MRN: [id number]   DOB - 1923-01-29  Admit Date - 04/29/2013  Outpatient Primary MD for the patient is Noralee Space, MD   With History of -  Past Medical History  Diagnosis Date  . Hypertension   . Hyperlipidemia   . Aortic valve disorders   . Unspecified hereditary and idiopathic peripheral neuropathy   . Other abnormal glucose   . Unspecified hearing loss   . Unspecified chronic bronchitis   . Unspecified hypothyroidism   . Unspecified gastritis and gastroduodenitis without mention of hemorrhage   . Diverticulosis of colon (without mention of hemorrhage)   . Irritable bowel syndrome   . Osteoarthrosis, unspecified whether generalized or localized, unspecified site   . Backache, unspecified   . Pain in limb   . Osteoporosis, unspecified   . Herpes zoster with other nervous system complications(053.19)   . Anxiety state, unspecified   . Anemia, unspecified   . Bradycardia     s/p PPM      Past Surgical History  Procedure Laterality Date  . Pacemaker insertion      MDT  . Appendectomy    . Cholecystectomy    . Tonsillectomy    . Vaginal hysterectomy    . Cataract extraction, bilateral  end of June 2013  . Cataract extraction, bilateral  beginning of July 2013    in for   Chief Complaint  Patient presents with  . Shortness of Breath     HPI  Kayla Davis  is a 81 y.o. female, with history of hypertension, dyslipidemia, hearing loss, hypothyroidism, irritable bowel syndrome, anxiety, who is being treated with Levaquin initially and then with nitrofurantoin for UTI for the last 7-10 days, has been feeling increasingly weak all over her body, yesterday in the bathroom she had a fall, after which she's been having some dull lower back pain which actually is improving. Today she went to see her physicians  where a L. spine x-ray was obtained which incidentally showed some changes in her lung fields after which chest x-ray was obtained suggesting pneumonia bilaterally. She was then referred to the ER for hospital admission. Interestingly patient besides having a dry cough does not have any fever shortness of breath, no body aches, no exposure to sick contacts. Currently feeling quite better except for generalized weakness in the lower back pain. In the ER workup again suggestive of community acquired pneumonia by virtue of her chest x-ray and generalized weakness.    Review of Systems    In addition to the HPI above  No Fever-chills, No Headache, No changes with Vision or hearing, No problems swallowing food or Liquids, No Chest pain, mild dry Cough but no Shortness of Breath, No Abdominal pain, No Nausea or Vommitting, Bowel movements are regular, No Blood in stool or Urine, No dysuria, No new skin rashes or bruises, No new joints pains-aches, except the lower back pain which is improving No new weakness, tingling,  numbness in any extremity, does have generalized weakness No recent weight gain or loss, No polyuria, polydypsia or polyphagia, No significant Mental Stressors.  A full 10 point Review of Systems was done, except as stated above, all other Review of Systems were negative.   Social History History  Substance Use Topics  . Smoking status: Never Smoker   . Smokeless tobacco: Never Used  . Alcohol Use: No      Family History Family History  Problem Relation Age of Onset  . Heart disease Father   . Cancer Paternal Grandmother   . Diabetes Father   . Heart disease Mother       Prior to Admission medications   Medication Sig Start Date End Date Taking? Authorizing Provider  alendronate (FOSAMAX) 70 MG tablet Take 70 mg by mouth once a week. Take with a full glass of water on an empty stomach on Thursdays   Yes Historical Provider, MD  Alpha-Lipoic Acid 200 MG CAPS Take  1 capsule by mouth daily.    Yes Historical Provider, MD  aspirin 325 MG tablet Take 325 mg by mouth daily.     Yes Historical Provider, MD  Calcium Carbonate-Vitamin D (CALTRATE 600+D) 600-400 MG-UNIT per tablet Take 1 tablet by mouth daily.     Yes Historical Provider, MD  cholecalciferol (VITAMIN D) 1000 UNITS tablet Take 1,000 Units by mouth daily.     Yes Historical Provider, MD  co-enzyme Q-10 30 MG capsule Take 30 mg by mouth daily.     Yes Historical Provider, MD  Cyanocobalamin (B-12) 2500 MCG TABS Take 1 tablet by mouth daily.    Yes Historical Provider, MD  docusate sodium (COLACE) 100 MG capsule Take 200 mg by mouth at bedtime.    Yes Historical Provider, MD  fish oil-omega-3 fatty acids 1000 MG capsule Take 1 g by mouth daily.     Yes Historical Provider, MD  guaiFENesin (MUCINEX) 600 MG 12 hr tablet Take 600 mg by mouth 2 (two) times daily as needed for to loosen phlegm.    Yes Historical Provider, MD  levothyroxine (SYNTHROID, LEVOTHROID) 125 MCG tablet Take 125 mcg by mouth daily before breakfast.   Yes Historical Provider, MD  losartan-hydrochlorothiazide (HYZAAR) 50-12.5 MG per tablet Take 1/2 tablet by mouth every morning 03/22/13  Yes Noralee Space, MD  Misc Natural Products (OSTEO BI-FLEX ADV DOUBLE ST) TABS Take 1 tablet by mouth 2 (two) times daily.   Yes Historical Provider, MD  nitrofurantoin (MACRODANTIN) 100 MG capsule Take 100 mg by mouth 2 (two) times daily.   Yes Historical Provider, MD  polyethylene glycol (MIRALAX / GLYCOLAX) packet Take 17 g by mouth as needed. 09/29/12  Yes Marin Olp, MD  pregabalin (LYRICA) 75 MG capsule Take 75 mg by mouth at bedtime.  12/20/12 12/20/13 Yes Noralee Space, MD  Probiotic Product (PROBIOTIC FORMULA) CAPS Take 1 capsule by mouth daily.     Yes Historical Provider, MD  simvastatin (ZOCOR) 40 MG tablet Take 40 mg by mouth at bedtime.   Yes Historical Provider, MD  levofloxacin (LEVAQUIN) 500 MG tablet Take 1 tablet (500 mg total) by  mouth daily. 04/29/13   Melvenia Needles, NP    Allergies  Allergen Reactions  . Penicillins Anaphylaxis    REACTION: swells up all over body, pt states she almost died  . Alprazolam      REACTION: eyes swell up  . Bactrim [Sulfamethoxazole-Trimethoprim]     Unable to remember the side  effects  . Ceclor [Cefaclor]     Unsure of the side effects  . Cephalexin     Unsure of the side effects  . Clindamycin/Lincomycin     Upset stomach  . Hydrocodone     Unsure of reaction to med  . Noroxin [Norfloxacin]     Unsure of side effects  . Sulfonamide Derivatives     REACTION: itching  . Tetracyclines & Related     Unsure of the reaction to this medication    Physical Exam  Vitals  Blood pressure 140/55, pulse 67, temperature 97.3 F (36.3 C), temperature source Oral, resp. rate 22, weight 68.04 kg (150 lb), SpO2 93.00%.   1. General white elderly female lying in bed in NAD,     2. Normal affect and insight, Not Suicidal or Homicidal, Awake Alert, Oriented X 3.  3. No F.N deficits, ALL C.Nerves Intact, Strength 5/5 all 4 extremities, Sensation intact all 4 extremities, Plantars down going.  4. Ears and Eyes appear Normal, Conjunctivae clear, PERRLA. Moist Oral Mucosa.  5. Supple Neck, No JVD, No cervical lymphadenopathy appriciated, No Carotid Bruits.  6. Symmetrical Chest wall movement, Good air movement bilaterally, few bilateral basilar crackles  7. RRR, No Gallops, Rubs or Murmurs, No Parasternal Heave.  8. Positive Bowel Sounds, Abdomen Soft, Non tender, No organomegaly appriciated,No rebound -guarding or rigidity.  9.  No Cyanosis, Normal Skin Turgor, No Skin Rash or Bruise.  10. Good muscle tone,  joints appear normal , no effusions, Normal ROM.  11. No Palpable Lymph Nodes in Neck or Axillae     Data Review  CBC  Recent Labs Lab 04/29/13 1723 04/29/13 2030  WBC 10.9* 12.1*  HGB 11.0* 11.0*  HCT 32.6* 31.9*  PLT 252.0 234  MCV 92.9 91.9  MCH  --   31.7  MCHC 33.8 34.5  RDW 13.4 12.9  LYMPHSABS 1.9 1.9  MONOABS 0.6 0.9  EOSABS 1.4* 1.6*  BASOSABS 0.0 0.1   ------------------------------------------------------------------------------------------------------------------  Chemistries   Recent Labs Lab 04/29/13 1723 04/29/13 2030  NA 134* 134*  K 3.4* 3.4*  CL 100 96  CO2 26 24  GLUCOSE 163* 175*  BUN 16 18  CREATININE 0.8 0.69  CALCIUM 8.7 8.9   ------------------------------------------------------------------------------------------------------------------ CrCl is unknown because both a height and weight (above a minimum accepted value) are required for this calculation. ------------------------------------------------------------------------------------------------------------------ No results found for this basename: TSH, T4TOTAL, FREET3, T3FREE, THYROIDAB,  in the last 72 hours   Coagulation profile No results found for this basename: INR, PROTIME,  in the last 168 hours ------------------------------------------------------------------------------------------------------------------- No results found for this basename: DDIMER,  in the last 72 hours -------------------------------------------------------------------------------------------------------------------  Cardiac Enzymes No results found for this basename: CK, CKMB, TROPONINI, MYOGLOBIN,  in the last 168 hours ------------------------------------------------------------------------------------------------------------------ No components found with this basename: POCBNP,    ---------------------------------------------------------------------------------------------------------------  Urinalysis    Component Value Date/Time   COLORURINE YELLOW 04/29/2013 2028   APPEARANCEUR CLEAR 04/29/2013 2028   LABSPEC 1.013 04/29/2013 2028   PHURINE 6.5 04/29/2013 2028   [place] 04/29/2013 2028   HGBUR NEGATIVE 04/29/2013 2028   BILIRUBINUR NEGATIVE  04/29/2013 2028   KETONESUR NEGATIVE 04/29/2013 2028   PROTEINUR NEGATIVE 04/29/2013 2028   UROBILINOGEN 0.2 04/29/2013 2028   NITRITE NEGATIVE 04/29/2013 2028   LEUKOCYTESUR TRACE* 04/29/2013 2028    ----------------------------------------------------------------------------------------------------------------  Imaging results:   Dg Chest 2 View  04/29/2013   CLINICAL DATA:  Cough and congestion. Right-sided rib pain. Recent history of fall.  EXAM: CHEST  2 VIEW  COMPARISON:  Chest x-ray 01/13/2013.  FINDINGS: Coarse interstitial markings throughout the mid to lower lungs bilaterally. Potential airspace consolidation in the lower lobes. Mild blunting of the costophrenic sulci bilaterally may suggest trace bilateral pleural effusions. No evidence of pulmonary edema. Heart size is normal. Mediastinal contours are mildly distorted by patient's rotation to the right, but are otherwise within normal limits. Atherosclerosis in the thoracic aorta. Right-sided pacemaker device in place with lead tips projecting over the expected location of the right atrium and right ventricular apex. No definite acute displaced rib fractures are identified on this two view examination.  IMPRESSION: 1. Patchy airspace disease and coarse interstitial markings throughout the lung bases bilaterally. These findings may reflect sequela of recent aspiration, or developing infection. 2. Potential trace bilateral pleural effusions. 3. Atherosclerosis. 4. No definite acute displaced rib fractures.   Electronically Signed   By: Vinnie Langton M.D.   On: 04/29/2013 17:20   Dg Lumbar Spine 2-3 Views  04/29/2013   CLINICAL DATA:  History of fall complaining of low back pain.  EXAM: LUMBAR SPINE - 2-3 VIEW  COMPARISON:  CT of the abdomen and pelvis 10/10/2005.  FINDINGS: Three views of the lumbar spine demonstrate no acute displaced fracture or compression type fracture. 9 mm of anterolisthesis of L5 upon S1. Severe multilevel degenerative  disc disease and facet arthropathy, most pronounced at L4-L5 and L5-S1. Mild dextroscoliosis of the lumbar spine, convex to the right at the level of L3.  IMPRESSION: 1. No acute radiographic abnormality of the lumbar spine. 2. Severe multilevel degenerative disc disease, lumbar spondylosis and dextroscoliosis, as above.   Electronically Signed   By: Vinnie Langton M.D.   On: 04/29/2013 17:27         Assessment & Plan    1.CAP - admit to MedSurg bed, sputum cultures, she is currently afebrile with stable WBC count, she has recently been exposed to Levaquin and nitrofurantoin for UTI, she is penicillin and cephalosporin allergy, will place her azithromycin and meropenem dose per pharmacy. Oxygen nebulizer treatments if required.    2. UTI. Finished her last nitrofurantoin dose today, repeat UA with culture.    3. Generalized weakness due to UTI-CAP with fall yesterday in the bathroom. L-spine x-ray does not show any acute fracture, ambulate increase activity, gentle IV fluids, PT if needed.     4. History of bradycardia status post pacemaker placement. No acute issues no chest pain no palpitations.    5. Hypothyroidism continue home dose Synthroid check TSH baseline.     DVT Prophylaxis Heparin    AM Labs Ordered, also please review Full Orders  Family Communication: Admission, patients condition and plan of care including tests being ordered have been discussed with the patient and son who indicate understanding and agree with the plan and Code Status.  Code Status Full  Likely DC to  Home  Condition Fair  Time spent in minutes : 35    Keryl Gholson K M.D on 04/29/2013 at 9:50 PM  Between 7am to 7pm - Pager - [phone]  After 7pm go to [email or web address] - password [email or web address]  And look for the night coverage person covering me after hours  [facility] Office  [phone]

## 2013-04-29 NOTE — Assessment & Plan Note (Signed)
CXR returns with no rib fx but bilateral consolidation in lower lobes.  Suspected PNA  D/t pt age, co-morbidities , and current UTI believe she should be admitted for PNA  Pt daughter contacted with xray results. And in agreement , will take pt to ER for hospital admit Dr. Joya Gaskins  Notified and aware.

## 2013-04-29 NOTE — Assessment & Plan Note (Signed)
Low back pain and rib pain s/p fall on 04/28/13  Check LS film and CXR   Plan  Alternate ice and heat to back.  May take tylenol As needed  Pain  I will call with labs and xray  Please contact office for sooner follow up if symptoms do not improve or worsen or seek emergency care  follow up Dr. Lenna Gilford  As planned and As needed

## 2013-04-29 NOTE — ED Provider Notes (Signed)
Medical screening examination/treatment/procedure(s) were conducted as a shared visit with non-physician practitioner(s) and myself.  I personally evaluated the patient during the encounter.  EKG Interpretation   None       Pt with chills, coughing for several days.  Pt recently treated for UTI, she thought she was still feeling weak from UTI.  Pt finished course of macrobid.  Pt seen by PCP PA and had CXR done to assess for rib injury after a fall, found to have diffuse patchy infiltrates.  Pt sent to the ED for admission given general health and overall risk factors given comorbitities.  Doesn't require sig O2 supplement, O2 sats 96% on RA.  Will need IV abx and monitoring in the hospital after reviewing note from PCP office.  Pt with minimal exp wheeze bilateral.  Not hypotensive, not septic appearing.    Saddie Benders. Dorna Mai, MD 04/29/13 2228

## 2013-04-29 NOTE — Progress Notes (Signed)
Subjective:    Patient ID: Kayla Davis, female    DOB: Jul 02, 1922, 80 y.o.   MRN: [id number]  HPI 108-  Recurrent bronchitic infections;  HBP;  Aortic valve dis;  Cardiac pacemaker;  Ven insuffic;  Hyperlipidemia;  DM;  Hypothyroid;  Divertics/ constip;  DJD/ LBP;  Osteopenia;  Hx TIA;  Periph neuropathy;  Anxiety;  Anemia; and hx Shingles...  04/29/2013 Acute OV  Complains of fall yesterday am in bathroom , lost her balance pulling up her clothes. Has had a UTI, f/by urology. Says she was on Cipro but it was changed to macrobid 1 week ago. Has had nausea and weakness since then. Has sinus drainage, coughed up yellow mucus.  Has right rib pain and low back pain since fall. Gets a catch in her back when she moves around. No leg weakness of worsening urinary symptoms.  No fever, body aches, v/d, headache , visual/speech changes.  Took tylenol for pain yesterday.         Problem List:  HEARING LOSS (ICD-389.9) - she has bilat hearing aides & [name] has washed out her EACs...  Hx of BRONCHITIS, RECURRENT >> Hx Pneumonia >> ~  8/12: she had CAP in R[place] treated w/ Avelox & slowly resolved back to baseline... ~  CXR 9/12 w/ sl pectus excavatum, mod thor spondylosis, mild cardiomeg, pacer, mild left base scarring, NAD...  HYPERTENSION (ICD-401.9) - controlled on DIOVAN/Hct 80-12.5 daily...  ~  4/12:  BP=120/58 and tol med well... she denies HA, fatigue, visual changes, CP, palipit, ch in dyspnea, edema, etc... ~  1/13:  BP= 142/66 & she remains largely asymptomatic...  ~  6/13:  BP= 126/74 & feeling well w/o CP, palpit, dizzy, ch in SOB, edema; Creat=0.7, BNP=111. ~  6/14:  on DiovaHCT80-12.5; BP= 138/62 & she denies anginaCP, palpit, dizzy, ch in SOB, edema, etc.  CHEST WALL PAIN, HX OF (ICD-V15.89) - on ASA 325m/d... AORTIC VALVE DISORDERS (ICD-424.1)- s/p cardiac work up in 2001... currently denies CP etc... ~  NuclearStressTest 3/01 showed impaired exerc capacity & ST depression but no  ischemia or infarction on images and EF=72%... ~  2DEcho 10/06 showed no regional wall motion abn, EF= 70%, mild incr AV thickness w/ reduced excursion, mild AI, sl decr RV function. ~  2DEcho 5/11 showed norm LV size & function, EF=55-60%, mild AI w/ sl thickening of valve, mild MR, mod TR, grI DD...  PACER for Pre-syncope>  Hosp 10/11 for presyncope w/ LBBB & pauses ==> pacer placed & she is now stable, asymptomatic, & followed in the device clinic by DrAllred... ~  10/12: she had f/u DrAllred, doing well, normal pacer function; she was complaining of leg weakness she though due to statin rx & they held it for a month... ~  She maintains regular f/u w/ drAllred et al & pacer function is normal....  VENOUS INSUFFICIENCY (ICD-459.81) - she has mod VV, VI, & chr leg symptoms... good arterial pulses distally, no ischemic symptoms. ~  11/12:  Venous Dopplers done for leg pain were neg for DVT, no superfic thrombus or incompetence either...  HYPERLIPIDEMIA (ICD-272.4) - on SIMVASTATIN 450md & she has stopped it on several occas due to leg discomfort to see if this would help- it has never lead to improved leg symptoms!... also takes FISH OIL daily. ~  F[place]/08 on Simvastatin 4051m showed TChol 171, TG 88, HDL 58, LDL 96... ~  FL[place]09 showed TChol 220, TG 98, HDL 58, LDL 143... rec to  restart her Simvast40. ~  10/10: not fasting at present & we discussed f/u FLP on ret in 2011 ~  [place] 4/11 showed TChol 176, TG 81, HDL 65, LDL 95 ~  FLP 4/12 showed TChol 189, TG 103, HDL 70, LDL 99 ~  FLP 6/13 on Simva40 showed TChol 161, TG 123, HDL 63, LDL 73 ~  FLP 6/14 on simva40 showed TChol 170, TG 83, HDL 59, LDL 95   DIABETES MELLITUS, BORDERLINE (ICD-790.29) - on diet alone...  ~  labs 4/08 w/ BS= 106... ~  labs 2/09 w/ BS= 170, HgA1c= 6.7.Marland KitchenMarland Kitchen rec to start Metformin 579m/d but she declined, prefer diet... ~  labs 6/09 showed BS= 97, HgA1c= 6.6..Marland KitchenMarland Kitchenrec- continue diet Rx. ~  labs 3/10 showed BS= 129, A1c=  6.6 ~  labs 9/10 showed BS= 108 ~  labs 4/11 showed BS= 96 ~  Labs 4/12 showed BS= 89 ~  Labs 6/13 on diet alone showed BS= 86, A1c= 6.5 ~  Labs 6/14 on diet alone showed BS=101, A1c=6.6   HYPOTHYROIDISM (ICD-244.9) - on SYNTHROID 1264m/d... ~  TSH 4/08 = 1.45... keep same dose. ~  TSH 2/09 = 0.17 on 12540md dose... rec to decr to 100m64m... ~  TSH 6/09 = 6.20 on 100mc19mdose... she wants ret to 125mcg92me. ~  TSH 3/10 = 0.41 on 125mcg/69mse. ~  TSH 4/11 = 0.24 on 125mcg/d41me... She doesn't want to decr the dose. ~  TSH 4/12 = 0.83 on 125mcg/d 10m ~  TSH 6/13 = 2.41 on 125mcg/d ~68mbs 6/14 on Synthroid125 showed TSH= 5.24  DIVERTICULOSIS OF COLON (ICD-562.10) & CONSTIPATION (ICD-564.00) - we discussed a regimen to prevent constipation & she uses MIRALAX Prn... ~  Flex Sig 6/96 by DrSam showed divertics, otherw normal left colon...  DEGENERATIVE JOINT DISEASE (ICD-715.90) - she takes Glucosamine/ Chondroitin... LOW BACK PAIN & ?Neurogenic claudication in legs> ~  she persisits w/ mult somatic complaints--- she states she has tried off Simvastatin, Lyrica, Fosamax & others but nothing helps and her symptoms persist... XRays showed mild DJD both hips & lower spine; & BMD 5/09 showed min osteopenia w/ TScores -0.0 in Spine & -1.4 in right FemNeck... she has tried Rx w/ Tramadol, Advil, Osteobiflex, heat, etc...  ~  eval by Ortho- DrRamos w/ LBP, multilevel DDD, spondylolisthesis L5-S1 & bilat leg pain> she's tried phys therapy, water exercises, meds (no benefit); epid steroid shots have helped some... ~  6/13:  Pt back on LYRICA 75mg Qhs a56mr request for neuropathy & improved, she says...  OSTEOPOROSIS (ICD-733.00) - on ALENDRONATE 70mg/wk, ca39mm, vitamins... ~  BMD here 5/09 showed TScores of 0.00 in Spine, and -1.4 in the right Fem Neck ~  BMD here 9/11 showed TScores of +0.8 in Spine, and -1.7 in the right FemNeck... [facility]  Hx of TRANSIENT ISCHEMIC  ATTACK (ICD-435.9) - she remains on ASA and has not had any new cerebral ischemic symptoms...  PERIPHERAL NEUROPATHY (ICD-356.9) - c/o chronic leg discomfort as noted... extensive work up in the past- DrStuckey 2001 w/ normal art dopplers; DrLove in 2001 w/ a neg NCV study;  DrAplington w/ meds and TENS trial (per her request- it helped her brother)... now DrRamos w/ PT, water exercises, and epid shots... tried Lyrica and now she thinks it's helping...  ANXIETY (ICD-300.00) - she declines anxiolytic Rx.  SHINGLES (ICD-053.9) - developed shingles Mar09 involving right sacral dermatomes w/ rash and pain in the perineal and buttock  areas... treated via ER & several visits w/ NP w/ Valtrex, Pred, Lyrica, Vicodin... lesions resolved, some persist post-herpetic neuralgia...  ANEMIA, MILD (ICD-285.9) ~  labs 9/09 showed Hg= 11.8.[name]. ~  labs 3/10 showed Hg= 11.6, Fe= 64 (sat=20%) ~  labs 4/11 showed = Hg= 11.4 ~  Labs 4/12 showed Hg= 11.9 ~  Labs 6/13 showed Hg= 11.1, MCV=95  HEALTH MAINTENANCE:  she had the 2010 Flu shot 9/10... PNEUMOVAX in 2006 age age 43...   Past Surgical History  Procedure Laterality Date  . Pacemaker insertion      MDT  . Appendectomy    . Cholecystectomy    . Tonsillectomy    . Vaginal hysterectomy    . Cataract extraction, bilateral  end of June 2013  . Cataract extraction, bilateral  beginning of July 2013    Outpatient Encounter Prescriptions as of 04/29/2013  Medication Sig  . alendronate (FOSAMAX) 70 MG tablet TAKE 1 TABLET EVERY 7 DAYS.TAKE WITH A FULL GLASS OF  WATER ON AN EMPTY STOMACH  . Alpha-Lipoic Acid 200 MG CAPS Take 1 capsule by mouth daily.   [name] aspirin 325 MG tablet Take 325 mg by mouth daily.    . Calcium Carbonate-Vitamin D (CALTRATE 600+D) 600-400 MG-UNIT per tablet Take 1 tablet by mouth daily.    . cholecalciferol (VITAMIN D) 1000 UNITS tablet Take 1,000 Units by mouth daily.    [name] co-enzyme Q-10 30 MG capsule Take 30 mg by mouth daily.    .  Cyanocobalamin (B-12) 2500 MCG TABS Take 1 tablet by mouth daily.   [name] docusate sodium (COLACE) 100 MG capsule Take 100 mg by mouth at bedtime.   . fish oil-omega-3 fatty acids 1000 MG capsule Take 1 g by mouth daily.    [name] guaiFENesin (MUCINEX) 600 MG 12 hr tablet Take 600 mg by mouth 2 (two) times daily as needed.  . Homeopathic Products ([employer] LEG CRAMPS PAIN RELIEF PO) 3 tablets a day as needed for leg cramps  . levothyroxine (SYNTHROID) 125 MCG tablet TAKE 1 TABLET DAILY  . losartan-hydrochlorothiazide (HYZAAR) 50-12.5 MG per tablet Take 1/2 tablet by mouth every morning  . Misc Natural Products (OSTEO BI-FLEX ADV DOUBLE ST) TABS Take 1 tablet by mouth 2 (two) times daily.  . polyethylene glycol (MIRALAX / GLYCOLAX) packet Take 17 g by mouth as needed.  . pregabalin (LYRICA) 75 MG capsule Take 150 mg by mouth at bedtime.  . Probiotic Product (PROBIOTIC FORMULA) CAPS Take 1 capsule by mouth daily.    . simvastatin (ZOCOR) 40 MG tablet Take 40 mg by mouth at bedtime.  [name] levofloxacin (LEVAQUIN) 500 MG tablet Take 1 tablet (500 mg total) by mouth daily.  . [DISCONTINUED] azithromycin (ZITHROMAX) 250 MG tablet Take as dirtected  . [DISCONTINUED] ciprofloxacin (CIPRO) 250 MG tablet Take 1 tablet (250 mg total) by mouth 2 (two) times daily.    Allergies  Allergen Reactions  . Penicillins Anaphylaxis    REACTION: swells up all over body, pt states she almost died  . Alprazolam      REACTION: eyes swell up  . Bactrim [Sulfamethoxazole-Trimethoprim]     Unable to remember the side effects  . Ceclor [Cefaclor]     Unsure of the side effects  . Cephalexin     Unsure of the side effects  . Clindamycin/Lincomycin     Upset stomach  . Hydrocodone     Unsure of reaction to med  . Noroxin [Norfloxacin]     Unsure of  side effects  . Sulfonamide Derivatives     REACTION: itching  . Tetracyclines & Related     Unsure of the reaction to this medication    Current Medications, Allergies, Past  Medical History, Past Surgical History, Family History, and Social History were reviewed in [employer] record.    Review of Systems         See HPI - all other systems neg except as noted... The patient complains of decreased hearing, dyspnea on exertion, and difficulty walking.  The patient denies anorexia, fever, weight loss, weight gain, vision loss, hoarseness, chest pain, syncope, peripheral edema,headaches, hemoptysis, abdominal pain, melena, hematochezia, severe indigestion/heartburn, hematuria, incontinence, muscle weakness, suspicious skin lesions, transient blindness, depression, unusual weight change, abnormal bleeding, enlarged lymph nodes, and angioedema.    Objective:   Physical Exam      WD, WN, 80 y/o WF in NAD;  VS- reviewed. GENERAL:  Alert & oriented; pleasant & cooperative... HEENT:  Page/AT, EACs-clear (she has bilat hearing aides), TMs-wnl, NOSE-clear, THROAT-clear & wnl. NECK:  Supple w/ fair ROM; no JVD; normal carotid impulses w/o bruits; no thyromegaly or nodules palpated; no lymphadenopathy. CHEST:  Clear to P & A; without wheezes/ rales/ or rhonchi heard; pacer in right upper shoulder area. HEART:  Regular gr 2/6 SEM S4 without rubs... ABDOMEN:  Soft & nontender; normal bowel sounds; no organomegaly or masses detected. No guarding or rebound.  EXT: without deformities, mild arthritic changes; +varicose veins/ +venous insuffic/ no edema. Tender along right lateral ribs. And along low lumbar region.  MAEW. Has bruising along right hand, good hand grip, make a fist without difficulty.  NEURO:   no focal neuro deficits x decr sensation in LE's c/w periph neuropathy.  DERM:  No lesions noted; no rash etc..[name]

## 2013-04-29 NOTE — ED Notes (Signed)
Patient reports that she has not felt good for a day or 2 - she also is battling a UTI - the patient fell last night and went to MD[facility] today where they dx her with double PNA. The patient is here for treatment

## 2013-04-29 NOTE — Patient Instructions (Addendum)
Alternate ice and heat to back.  May take tylenol As needed  Pain  I will call with labs and xray  Please contact office for sooner follow up if symptoms do not improve or worsen or seek emergency care  follow up Dr. Lenna Gilford  As planned and As needed     Late add : PNA on xray  -bilateral consolidation  Advised that pt d/t age and multiple co-morbidities would be safer

## 2013-04-29 NOTE — ED Provider Notes (Signed)
CSN: [id number]     Arrival date & time 04/29/13  1855 History   First MD Initiated Contact with Patient 04/29/13 2006     Chief Complaint  Patient presents with  . Shortness of Breath   (Consider location/radiation/quality/duration/timing/severity/associated sxs/prior Treatment) HPI Comments: Patient presents to the emergency department with chief complaint of back pain. Patient states that she suffered a fall yesterday and hurt her ribs. She was seen by her primary care doctor today, where an x-ray was obtained. Plain films revealed bilateral pneumonia. Patient states that she has been a little short of breath, and has been coughing up productive sputum. She states this has been happening for the past several days. Additionally, patient was recently treated for a UTI. She finished her antibiotics today. Patient was sent to the ED by her PCP. She denies fevers or chills, but states that she has felt very weak for the past week or so.  The history is provided by the patient. No language interpreter was used.    Past Medical History  Diagnosis Date  . Hypertension   . Hyperlipidemia   . Aortic valve disorders   . Unspecified hereditary and idiopathic peripheral neuropathy   . Other abnormal glucose   . Unspecified hearing loss   . Unspecified chronic bronchitis   . Unspecified hypothyroidism   . Unspecified gastritis and gastroduodenitis without mention of hemorrhage   . Diverticulosis of colon (without mention of hemorrhage)   . Irritable bowel syndrome   . Osteoarthrosis, unspecified whether generalized or localized, unspecified site   . Backache, unspecified   . Pain in limb   . Osteoporosis, unspecified   . Herpes zoster with other nervous system complications(053.19)   . Anxiety state, unspecified   . Anemia, unspecified   . Bradycardia     s/p PPM   Past Surgical History  Procedure Laterality Date  . Pacemaker insertion      MDT  . Appendectomy    . Cholecystectomy    .  Tonsillectomy    . Vaginal hysterectomy    . Cataract extraction, bilateral  end of June 2013  . Cataract extraction, bilateral  beginning of July 2013   Family History  Problem Relation Age of Onset  . Heart disease Father   . Cancer Paternal Grandmother   . Diabetes Father   . Heart disease Mother    History  Substance Use Topics  . Smoking status: Never Smoker   . Smokeless tobacco: Never Used  . Alcohol Use: No   OB History   Grav Para Term Preterm Abortions TAB SAB Ect Mult Living                 Review of Systems  All other systems reviewed and are negative.    Allergies  Penicillins; Alprazolam; Bactrim; Ceclor; Cephalexin; Clindamycin/lincomycin; Hydrocodone; Noroxin; Sulfonamide derivatives; and Tetracyclines & related  Home Medications   Current Outpatient Rx  Name  Route  Sig  Dispense  Refill  . alendronate (FOSAMAX) 70 MG tablet   Oral   Take 70 mg by mouth once a week. Take with a full glass of water on an empty stomach on Thursdays         . Alpha-Lipoic Acid 200 MG CAPS   Oral   Take 1 capsule by mouth daily.          [name] aspirin 325 MG tablet   Oral   Take 325 mg by mouth daily.           [name]  Calcium Carbonate-Vitamin D (CALTRATE 600+D) 600-400 MG-UNIT per tablet   Oral   Take 1 tablet by mouth daily.           . cholecalciferol (VITAMIN D) 1000 UNITS tablet   Oral   Take 1,000 Units by mouth daily.           [name] co-enzyme Q-10 30 MG capsule   Oral   Take 30 mg by mouth daily.           . Cyanocobalamin (B-12) 2500 MCG TABS   Oral   Take 1 tablet by mouth daily.          [name] docusate sodium (COLACE) 100 MG capsule   Oral   Take 200 mg by mouth at bedtime.          . fish oil-omega-3 fatty acids 1000 MG capsule   Oral   Take 1 g by mouth daily.           [name] guaiFENesin (MUCINEX) 600 MG 12 hr tablet   Oral   Take 600 mg by mouth 2 (two) times daily as needed for to loosen phlegm.          [name] levothyroxine (SYNTHROID,  LEVOTHROID) 125 MCG tablet   Oral   Take 125 mcg by mouth daily before breakfast.         . losartan-hydrochlorothiazide (HYZAAR) 50-12.5 MG per tablet      Take 1/2 tablet by mouth every morning   45 tablet   3   . Misc Natural Products (OSTEO BI-FLEX ADV DOUBLE ST) TABS   Oral   Take 1 tablet by mouth 2 (two) times daily.         . nitrofurantoin (MACRODANTIN) 100 MG capsule   Oral   Take 100 mg by mouth 2 (two) times daily.         . polyethylene glycol (MIRALAX / GLYCOLAX) packet   Oral   Take 17 g by mouth as needed.         . pregabalin (LYRICA) 75 MG capsule   Oral   Take 75 mg by mouth at bedtime.          . Probiotic Product (PROBIOTIC FORMULA) CAPS   Oral   Take 1 capsule by mouth daily.           . simvastatin (ZOCOR) 40 MG tablet   Oral   Take 40 mg by mouth at bedtime.         [name] levofloxacin (LEVAQUIN) 500 MG tablet   Oral   Take 1 tablet (500 mg total) by mouth daily.   10 tablet   0    BP 186/63  Pulse 72  Temp(Src) 97.3 F (36.3 C) (Oral)  Resp 18  Wt 150 lb (68.04 kg)  SpO2 93% Physical Exam  Nursing note and vitals reviewed. Constitutional: She is oriented to person, place, and time. She appears well-developed and well-nourished.  Alert, oriented, pleasant, speaks in full sentences  HENT:  Head: Normocephalic and atraumatic.  Eyes: Conjunctivae and EOM are normal. Pupils are equal, round, and reactive to light.  Neck: Normal range of motion. Neck supple.  Cardiovascular: Normal rate, regular rhythm and normal heart sounds.  Exam reveals no gallop and no friction rub.   No murmur heard. Pulmonary/Chest: Effort normal and breath sounds normal. No respiratory distress. She has no wheezes. She has no rales. She exhibits no tenderness.  Bilateral lower lobe crackles  Abdominal: Soft. She exhibits no  distension and no mass. There is no tenderness. There is no rebound and no guarding.  Musculoskeletal: Normal range of motion. She  exhibits no edema and no tenderness.  Neurological: She is alert and oriented to person, place, and time.  Skin: Skin is warm and dry.  Psychiatric: She has a normal mood and affect. Her behavior is normal. Judgment and thought content normal.    ED Course  Procedures (including critical care time) Labs Review Labs Reviewed - No data to display Imaging Review Dg Chest 2 View  04/29/2013   CLINICAL DATA:  Cough and congestion. Right-sided rib pain. Recent history of fall.  EXAM: CHEST  2 VIEW  COMPARISON:  Chest x-ray 01/13/2013.  FINDINGS: Coarse interstitial markings throughout the mid to lower lungs bilaterally. Potential airspace consolidation in the lower lobes. Mild blunting of the costophrenic sulci bilaterally may suggest trace bilateral pleural effusions. No evidence of pulmonary edema. Heart size is normal. Mediastinal contours are mildly distorted by patient's rotation to the right, but are otherwise within normal limits. Atherosclerosis in the thoracic aorta. Right-sided pacemaker device in place with lead tips projecting over the expected location of the right atrium and right ventricular apex. No definite acute displaced rib fractures are identified on this two view examination.  IMPRESSION: 1. Patchy airspace disease and coarse interstitial markings throughout the lung bases bilaterally. These findings may reflect sequela of recent aspiration, or developing infection. 2. Potential trace bilateral pleural effusions. 3. Atherosclerosis. 4. No definite acute displaced rib fractures.   Electronically Signed   By: Vinnie Langton M.D.   On: 04/29/2013 17:20   Dg Lumbar Spine 2-3 Views  04/29/2013   CLINICAL DATA:  History of fall complaining of low back pain.  EXAM: LUMBAR SPINE - 2-3 VIEW  COMPARISON:  CT of the abdomen and pelvis 10/10/2005.  FINDINGS: Three views of the lumbar spine demonstrate no acute displaced fracture or compression type fracture. 9 mm of anterolisthesis of L5 upon  S1. Severe multilevel degenerative disc disease and facet arthropathy, most pronounced at L4-L5 and L5-S1. Mild dextroscoliosis of the lumbar spine, convex to the right at the level of L3.  IMPRESSION: 1. No acute radiographic abnormality of the lumbar spine. 2. Severe multilevel degenerative disc disease, lumbar spondylosis and dextroscoliosis, as above.   Electronically Signed   By: Vinnie Langton M.D.   On: 04/29/2013 17:27    EKG Interpretation   None       MDM   1. CAP (community acquired pneumonia)   2. Anxiety state, unspecified   3. Diarrhea   4. Unspecified essential hypertension   5. UTI (urinary tract infection)      Patient with incidental pneumonia found on chest x-ray at pulmonologist's office.  Patient appears well, speaks in full sentences, 93% on room air.  Reviewed note from [facility] today, who recommend inpatient treatment based on co morbidities and age. Patient seen by and discussed with Dr. Dorna Mai, who recommends admission, as that was what was recommended by pulmonology.   Montine Circle, PA-C 04/29/13 2207

## 2013-04-30 ENCOUNTER — Encounter (HOSPITAL_COMMUNITY): Payer: Self-pay

## 2013-04-30 DIAGNOSIS — R5383 Other fatigue: Secondary | ICD-10-CM

## 2013-04-30 DIAGNOSIS — R531 Weakness: Secondary | ICD-10-CM | POA: Diagnosis present

## 2013-04-30 DIAGNOSIS — M549 Dorsalgia, unspecified: Secondary | ICD-10-CM

## 2013-04-30 DIAGNOSIS — J189 Pneumonia, unspecified organism: Secondary | ICD-10-CM | POA: Diagnosis not present

## 2013-04-30 DIAGNOSIS — R5381 Other malaise: Secondary | ICD-10-CM

## 2013-04-30 DIAGNOSIS — N390 Urinary tract infection, site not specified: Secondary | ICD-10-CM | POA: Diagnosis not present

## 2013-04-30 LAB — CBC
HEMATOCRIT: 29 % — AB (ref 36.0–46.0)
HEMOGLOBIN: 9.6 g/dL — AB (ref 12.0–15.0)
MCH: 30.3 pg (ref 26.0–34.0)
MCHC: 33.1 g/dL (ref 30.0–36.0)
MCV: 91.5 fL (ref 78.0–100.0)
Platelets: 217 10*3/uL (ref 150–400)
RBC: 3.17 MIL/uL — ABNORMAL LOW (ref 3.87–5.11)
RDW: 12.8 % (ref 11.5–15.5)
WBC: 13 10*3/uL — ABNORMAL HIGH (ref 4.0–10.5)

## 2013-04-30 LAB — BASIC METABOLIC PANEL
BUN: 13 mg/dL (ref 6–23)
CHLORIDE: 98 meq/L (ref 96–112)
CO2: 24 mEq/L (ref 19–32)
Calcium: 8.3 mg/dL — ABNORMAL LOW (ref 8.4–10.5)
Creatinine, Ser: 0.64 mg/dL (ref 0.50–1.10)
GFR calc Af Amer: 90 mL/min — ABNORMAL LOW (ref >90)
GFR calc non Af Amer: 77 mL/min — ABNORMAL LOW (ref >90)
GLUCOSE: 135 mg/dL — AB (ref 70–99)
POTASSIUM: 3.9 meq/L (ref 3.7–5.3)
Sodium: 134 mEq/L — ABNORMAL LOW (ref 137–147)

## 2013-04-30 LAB — STREP PNEUMONIAE URINARY ANTIGEN: Strep Pneumo Urinary Antigen: NEGATIVE

## 2013-04-30 MED ORDER — ACETAMINOPHEN 325 MG PO TABS
650.0000 mg | ORAL_TABLET | Freq: Four times a day (QID) | ORAL | Status: DC | PRN
  Administered 2013-04-30 – 2013-05-01 (×3): 650 mg via ORAL
  Filled 2013-04-30 (×3): qty 2

## 2013-04-30 NOTE — Progress Notes (Signed)
ANTIBIOTIC CONSULT NOTE - INITIAL  Pharmacy Consult for Meropenem Indication: pneumonia  Allergies  Allergen Reactions  . Penicillins Anaphylaxis    REACTION: swells up all over body, pt states she almost died  . Alprazolam      REACTION: eyes swell up  . Bactrim [Sulfamethoxazole-Trimethoprim]     Unable to remember the side effects  . Ceclor [Cefaclor]     Unsure of the side effects  . Cephalexin     Unsure of the side effects  . Clindamycin/Lincomycin     Upset stomach  . Hydrocodone     Unsure of reaction to med  . Noroxin [Norfloxacin]     Unsure of side effects  . Sulfonamide Derivatives     REACTION: itching  . Tetracyclines & Related     Unsure of the reaction to this medication    Patient Measurements: Height: 5\' 5"  (165.1 cm) Weight: 151 lb 8 oz (68.72 kg) IBW/kg (Calculated) : 57 Adjusted Body Weight:   Vital Signs: Temp: 97.6 F (36.4 C) (01/16 2243) Temp src: Oral (01/16 2243) BP: 191/73 mmHg (01/16 2243) Pulse Rate: 69 (01/16 2243) Intake/Output from previous day:   Intake/Output from this shift:    Labs:  Recent Labs  04/29/13 1723 04/29/13 2030  WBC 10.9* 12.1*  HGB 11.0* 11.0*  PLT 252.0 234  CREATININE 0.8 0.69   Estimated Creatinine Clearance: 47.3 ml/min (by C-G formula based on Cr of 0.69). No results found for this basename: VANCOTROUGH, VANCOPEAK, VANCORANDOM, GENTTROUGH, GENTPEAK, GENTRANDOM, TOBRATROUGH, TOBRAPEAK, TOBRARND, AMIKACINPEAK, AMIKACINTROU, AMIKACIN,  in the last 72 hours   Microbiology: No results found for this or any previous visit (from the past 720 hour(s)).  Medical History: Past Medical History  Diagnosis Date  . Hypertension   . Hyperlipidemia   . Aortic valve disorders   . Unspecified hereditary and idiopathic peripheral neuropathy   . Other abnormal glucose   . Unspecified hearing loss   . Unspecified chronic bronchitis   . Unspecified hypothyroidism   . Unspecified gastritis and gastroduodenitis  without mention of hemorrhage   . Diverticulosis of colon (without mention of hemorrhage)   . Irritable bowel syndrome   . Osteoarthrosis, unspecified whether generalized or localized, unspecified site   . Backache, unspecified   . Pain in limb   . Osteoporosis, unspecified   . Herpes zoster with other nervous system complications(053.19)   . Anxiety state, unspecified   . Anemia, unspecified   . Bradycardia     s/p PPM    Medications:  Anti-infectives   Start     Dose/Rate Route Frequency Ordered Stop   04/29/13 2315  meropenem (MERREM) 1 g in sodium chloride 0.9 % 100 mL IVPB     1 g 200 mL/hr over 30 Minutes Intravenous Every 12 hours 04/29/13 2301     04/29/13 2300  azithromycin (ZITHROMAX) 500 mg in dextrose 5 % 250 mL IVPB     500 mg 250 mL/hr over 60 Minutes Intravenous Daily at bedtime 04/29/13 2252       Assessment: Patient with PNA.    Goal of Therapy: Meropenem dosed based on patient weight and renal function   Plan:  Follow up culture results Meropenem 1gm iv q12hr  Tyler Deis, Shea Stakes [place] 04/30/2013,1:58 AM

## 2013-04-30 NOTE — Progress Notes (Signed)
[facility] PROGRESS NOTE  MARCILLE BARMAN YCX:[id number] DOB: 1922-12-18 DOA: 04/29/2013 PCP: Noralee Space, MD  Brief narrative 80 y.o. female, with history of hypertension, dyslipidemia, hearing loss, hypothyroidism, irritable bowel syndrome, anxiety, who was being treated with Levaquin initially and then with nitrofurantoin for UTI for the last 7-10 days, had been feeling increasingly weak all over her body, on 1/15 , in the bathroom she had a fall, after which she's been having some dull lower back pain which actually is improving. On 1/16  she went to see her physicians where a L. spine x-ray was obtained which incidentally showed some changes in her lung fields after which chest x-ray was obtained suggesting pneumonia bilaterally. She was then referred to the ER for hospital admission.   Assessment/Plan: community acquired pneumonia Patient denies any fever or chills. Only symptoms she had was cough and weakness. -  Since pt was recently  exposed to Levaquin and nitrofurantoin for UTI, she is penicillin and cephalosporin allergy, will place her azithromycin and meropenem dose per pharmacy. Prn nebs  follow cx Symptoms better this am.  UTI.  Completed her last nitrofurantoin dose on 1/16. repeat UA shows UTI. Follow with culture.    Generalized weakness  due to UTI-CAP with fall  in the bathroom on 1/15 . L-spine x-ray does not show any acute fracture,  continue IV fluids. PT and nutrition consult   History of bradycardia status post pacemaker placement.  No acute issues  Hypothyroidism  continue home dose Synthroid. check TSH   DVT Prophylaxis sq Heparin    Code Status: full code Family Communication: daughter at bedside Disposition Plan:pending PT eval   Consultants:  none  Procedures:  none  Antibiotics:  Meropenem and azithromycin  HPI/Subjective: Feels better since yesterday. Unable to sleep overnight for reason unclear  Objective: Filed Vitals:    04/30/13 0516  BP: 127/65  Pulse: 69  Temp: 98.4 F (36.9 C)  Resp: 20    Intake/Output Summary (Last 24 hours) at 04/30/13 1146 Last data filed at 04/30/13 0900  Gross per 24 hour  Intake    200 ml  Output    500 ml  Net   -300 ml   Filed Weights   04/29/13 1935 04/29/13 2243  Weight: 68.04 kg (150 lb) 68.72 kg (151 lb 8 oz)    Exam:   General:  Elderly thin built female in NAD  HEETN: no pallor, moist oral mucosa   chest: clear b/l , Fine bibasilar crackles  CVS: NS1&S2, no murmurs, rubs or gallop  Abd: soft,NT, ND, BS+  Ext: warm, no edema   CNS: AAOX3    Data Reviewed: Basic Metabolic Panel:  Recent Labs Lab 04/29/13 1723 04/29/13 2030 04/30/13 0540  NA 134* 134* 134*  K 3.4* 3.4* 3.9  CL 100 96 98  CO2 26 24 24   GLUCOSE 163* 175* 135*  BUN 16 18 13   CREATININE 0.8 0.69 0.64  CALCIUM 8.7 8.9 8.3*   Liver Function Tests: No results found for this basename: AST, ALT, ALKPHOS, BILITOT, PROT, ALBUMIN,  in the last 168 hours No results found for this basename: LIPASE, AMYLASE,  in the last 168 hours No results found for this basename: AMMONIA,  in the last 168 hours CBC:  Recent Labs Lab 04/29/13 1723 04/29/13 2030 04/30/13 0540  WBC 10.9* 12.1* 13.0*  NEUTROABS 7.0 7.5  --   HGB 11.0* 11.0* 9.6*  HCT 32.6* 31.9* 29.0*  MCV 92.9 91.9 91.5  PLT 252.0  234 217   Cardiac Enzymes: No results found for this basename: CKTOTAL, CKMB, CKMBINDEX, TROPONINI,  in the last 168 hours BNP (last 3 results) No results found for this basename: PROBNP,  in the last 8760 hours CBG: No results found for this basename: GLUCAP,  in the last 168 hours  No results found for this or any previous visit (from the past 240 hour(s)).   Studies: Dg Chest 2 View  04/29/2013   CLINICAL DATA:  Cough and congestion. Right-sided rib pain. Recent history of fall.  EXAM: CHEST  2 VIEW  COMPARISON:  Chest x-ray 01/13/2013.  FINDINGS: Coarse interstitial markings  throughout the mid to lower lungs bilaterally. Potential airspace consolidation in the lower lobes. Mild blunting of the costophrenic sulci bilaterally may suggest trace bilateral pleural effusions. No evidence of pulmonary edema. Heart size is normal. Mediastinal contours are mildly distorted by patient's rotation to the right, but are otherwise within normal limits. Atherosclerosis in the thoracic aorta. Right-sided pacemaker device in place with lead tips projecting over the expected location of the right atrium and right ventricular apex. No definite acute displaced rib fractures are identified on this two view examination.  IMPRESSION: 1. Patchy airspace disease and coarse interstitial markings throughout the lung bases bilaterally. These findings may reflect sequela of recent aspiration, or developing infection. 2. Potential trace bilateral pleural effusions. 3. Atherosclerosis. 4. No definite acute displaced rib fractures.   Electronically Signed   By: Vinnie Langton M.D.   On: 04/29/2013 17:20   Dg Lumbar Spine 2-3 Views  04/29/2013   CLINICAL DATA:  History of fall complaining of low back pain.  EXAM: LUMBAR SPINE - 2-3 VIEW  COMPARISON:  CT of the abdomen and pelvis 10/10/2005.  FINDINGS: Three views of the lumbar spine demonstrate no acute displaced fracture or compression type fracture. 9 mm of anterolisthesis of L5 upon S1. Severe multilevel degenerative disc disease and facet arthropathy, most pronounced at L4-L5 and L5-S1. Mild dextroscoliosis of the lumbar spine, convex to the right at the level of L3.  IMPRESSION: 1. No acute radiographic abnormality of the lumbar spine. 2. Severe multilevel degenerative disc disease, lumbar spondylosis and dextroscoliosis, as above.   Electronically Signed   By: Vinnie Langton M.D.   On: 04/29/2013 17:27    Scheduled Meds: . aspirin  325 mg Oral Daily  . azithromycin  500 mg Intravenous QHS  . docusate sodium  200 mg Oral QHS  . FLORA-Q  1 capsule  Oral Daily  . heparin  5,000 Units Subcutaneous Q8H  . hydrochlorothiazide  12.5 mg Oral Daily  . levothyroxine  125 mcg Oral QAC breakfast  . losartan  50 mg Oral Daily  . meropenem (MERREM) IV  1 g Intravenous Q12H  . pregabalin  75 mg Oral QHS  . simvastatin  40 mg Oral QHS   Continuous Infusions: . 0.9 % NaCl with KCl 20 mEq / L 75 mL/hr at 04/29/13 2330      Time spent: 25 minutes    Kayo Zion, [place]  [facility] Pager [phone]. If 7PM-7AM, please contact night-coverage at [email or web address], password [facility] 04/30/2013, 11:46 AM  LOS: 1 day

## 2013-05-01 DIAGNOSIS — R5381 Other malaise: Secondary | ICD-10-CM | POA: Diagnosis not present

## 2013-05-01 DIAGNOSIS — J189 Pneumonia, unspecified organism: Secondary | ICD-10-CM | POA: Diagnosis not present

## 2013-05-01 DIAGNOSIS — N390 Urinary tract infection, site not specified: Secondary | ICD-10-CM | POA: Diagnosis not present

## 2013-05-01 LAB — URINE CULTURE: Colony Count: >=100000

## 2013-05-01 LAB — CBC
HEMATOCRIT: 28.2 % — AB (ref 36.0–46.0)
HEMOGLOBIN: 9.1 g/dL — AB (ref 12.0–15.0)
MCH: 30 pg (ref 26.0–34.0)
MCHC: 32.3 g/dL (ref 30.0–36.0)
MCV: 93.1 fL (ref 78.0–100.0)
Platelets: 227 10*3/uL (ref 150–400)
RBC: 3.03 MIL/uL — AB (ref 3.87–5.11)
RDW: 13 % (ref 11.5–15.5)
WBC: 8 10*3/uL (ref 4.0–10.5)

## 2013-05-01 LAB — LEGIONELLA ANTIGEN, URINE: LEGIONELLA ANTIGEN, URINE: NEGATIVE

## 2013-05-01 LAB — TSH: TSH: 5.458 u[IU]/mL — AB (ref 0.350–4.500)

## 2013-05-01 LAB — HIV ANTIBODY (ROUTINE TESTING W REFLEX): HIV: NONREACTIVE

## 2013-05-01 MED ORDER — ENSURE COMPLETE PO LIQD
237.0000 mL | ORAL | Status: DC
  Administered 2013-05-01: 237 mL via ORAL

## 2013-05-01 NOTE — Evaluation (Signed)
Physical Therapy Evaluation Patient Details Name: Kayla Davis MRN: [id number] DOB: July 01, 1922 Today's Date: 05/01/2013 Time: 0254-2706 PT Time Calculation (min): 8 min  PT Assessment / Plan / Recommendation History of Present Illness  80 yo female admitted with bil Pna, fall, back pain. Hx of HTN. Pt lives alone mostly.   Clinical Impression  Limited evaluation-pt already up ambulating with family using walker-therapist only able to observe ~25 feet distance of ambulation. Pt wishing to return to room so she can speak with MD. Will attempt to return to further assess mobility, including ambulation since pt normally uses a cane. Pt states she wished to return home. At this time, recommend HHPT vs SNF depending on progress and assist available at home.     PT Assessment  Patient needs continued PT services    Follow Up Recommendations  Home health PT;SNF (depending on progress and assist available at home)    Does the patient have the potential to tolerate intense rehabilitation      Barriers to Discharge        Equipment Recommendations  None recommended by PT    Recommendations for Other Services OT consult   Frequency Min 3X/week    Precautions / Restrictions Precautions Precautions: Fall Restrictions Weight Bearing Restrictions: No   Pertinent Vitals/Pain No c/o pain during brief session      Mobility  Transfers Overall transfer level: Needs assistance Transfers: Sit to/from Stand Sit to Stand: Min assist General transfer comment: Assist to control descent.  Ambulation/Gait Ambulation/Gait assistance: Min guard Ambulation Distance (Feet): 25 Feet Assistive device: Rolling walker (2 wheeled) Gait Pattern/deviations: Trunk flexed;Decreased stride length;Decreased step length - right;Decreased step length - left General Gait Details: theraist only observed ~25 feet distance of ambulation. Pt was already up walking with family with walker.     Exercises     PT  Diagnosis: Difficulty walking;Generalized weakness  PT Problem List: Decreased strength;Decreased range of motion;Decreased activity tolerance;Decreased balance;Decreased mobility PT Treatment Interventions: DME instruction;Gait training;Functional mobility training;Therapeutic activities;Therapeutic exercise;Patient/family education;Balance training     PT Goals(Current goals can be found in the care plan section) Acute Rehab PT Goals Patient Stated Goal: home soon PT Goal Formulation: With patient/family Time For Goal Achievement: 05/15/13  Visit Information  Last PT Received On: 05/01/13 Assistance Needed: +1 History of Present Illness: 80 yo female admitted with bil Pna, fall, back pain. Hx of HTN. Pt lives alone mostly.        Prior [place] expects to be discharged to:: Private residence Living Arrangements: Children (son is home in evenings ~4 days a week) Available Help at Discharge: Family (daughter checks on occasionally) Type of Home: House Home Access: Stairs to enter;Ramped entrance Home Layout: One level Home Equipment: Environmental consultant - 2 wheels;Cane - quad;Cane - single point Prior Function Level of Independence: Independent with assistive device(s) Communication Communication: No difficulties    Cognition  Cognition Arousal/Alertness: Awake/alert Behavior During Therapy: WFL for tasks assessed/performed Overall Cognitive Status: Within Functional Limits for tasks assessed    Extremity/Trunk Assessment Upper Extremity Assessment Upper Extremity Assessment: Generalized weakness Lower Extremity Assessment Lower Extremity Assessment: Generalized weakness Cervical / Trunk Assessment Cervical / Trunk Assessment: Kyphotic   Balance    End of Session PT - End of Session Activity Tolerance: Patient tolerated treatment well Patient left: in chair;with call bell/phone within reach;with family/visitor present  GP     Weston Anna,  MPT Pager: [phone]

## 2013-05-01 NOTE — Progress Notes (Signed)
INITIAL NUTRITION ASSESSMENT  DOCUMENTATION CODES Per approved criteria  -Not Applicable   INTERVENTION: Ensure Complete po once daily, each supplement provides 350 kcal and 13 grams of protein  NUTRITION DIAGNOSIS: Inadequate oral intake related to pneumonia as evidenced by reported intake less than estimated needs.   Goal: Pt to meet >/= 90% of their estimated nutrition needs   Monitor:  Wt, po intake, labs  Reason for Assessment: Consult  80 y.o. female  Admitting Dx: <principal problem not specified>  ASSESSMENT: Kayla Davis is a 80 y.o. female, with history of hypertension, dyslipidemia, hearing loss, hypothyroidism, irritable bowel syndrome, anxiety, who is being treated with Levaquin initially and then with nitrofurantoin for UTI for the last 7-10 days, has been feeling increasingly weak all over her body, yesterday in the bathroom she had a fall, after which she's been having some dull lower back pain which actually is improving.  Pt reports that she follows a low carbohydrate diet at home, and that she watches the amount of salt that she uses. Family members present confirmed that pt follows a "very healthy diet." Pt has had no recent wt change. She requested that she be sent Ensure Complete once daily so that she can have it with her medications.  Height: Ht Readings from Last 1 Encounters:  04/29/13 5\' 5"  (1.651 m)    Weight: Wt Readings from Last 1 Encounters:  04/29/13 151 lb 8 oz (68.72 kg)    Ideal Body Weight: 61.5 kg  % Ideal Body Weight: 112%  Wt Readings from Last 10 Encounters:  04/29/13 151 lb 8 oz (68.72 kg)  04/29/13 150 lb 12.8 oz (68.402 kg)  03/22/13 156 lb (70.761 kg)  03/03/13 154 lb 6.4 oz (70.035 kg)  11/02/12 153 lb (69.4 kg)  10/01/12 154 lb 6.4 oz (70.035 kg)  09/14/12 156 lb 12.8 oz (71.124 kg)  08/25/12 156 lb 3.2 oz (70.852 kg)  05/18/12 155 lb (70.308 kg)  03/16/12 156 lb 6.4 oz (70.943 kg)    Usual Body Weight: 150  lbs  % Usual Body Weight: 101%  BMI:  Body mass index is 25.21 kg/(m^2).  Estimated Nutritional Needs: Kcal: 1700-2000 Protein: 80-95 g Fluid: 1.7-2.0 L  Skin: WNL  Diet Order: Cardiac  EDUCATION NEEDS: -Education needs addressed   Intake/Output Summary (Last 24 hours) at 05/01/13 1343 Last data filed at 05/01/13 0037  Gross per 24 hour  Intake 1402.5 ml  Output   1500 ml  Net  -97.5 ml    Last BM: none recorded   Labs:   Recent Labs Lab 04/29/13 1723 04/29/13 2030 04/30/13 0540  NA 134* 134* 134*  K 3.4* 3.4* 3.9  CL 100 96 98  CO2 26 24 24   BUN 16 18 13   CREATININE 0.8 0.69 0.64  CALCIUM 8.7 8.9 8.3*  GLUCOSE 163* 175* 135*    CBG (last 3)  No results found for this basename: GLUCAP,  in the last 72 hours  Scheduled Meds: . aspirin  325 mg Oral Daily  . azithromycin  500 mg Intravenous QHS  . docusate sodium  200 mg Oral QHS  . FLORA-Q  1 capsule Oral Daily  . heparin  5,000 Units Subcutaneous Q8H  . hydrochlorothiazide  12.5 mg Oral Daily  . levothyroxine  125 mcg Oral QAC breakfast  . losartan  50 mg Oral Daily  . meropenem (MERREM) IV  1 g Intravenous Q12H  . pregabalin  75 mg Oral QHS  . simvastatin  40 mg  Oral QHS    Continuous Infusions:   Past Medical History  Diagnosis Date  . Hypertension   . Hyperlipidemia   . Aortic valve disorders   . Unspecified hereditary and idiopathic peripheral neuropathy   . Other abnormal glucose   . Unspecified hearing loss   . Unspecified chronic bronchitis   . Unspecified hypothyroidism   . Unspecified gastritis and gastroduodenitis without mention of hemorrhage   . Diverticulosis of colon (without mention of hemorrhage)   . Irritable bowel syndrome   . Osteoarthrosis, unspecified whether generalized or localized, unspecified site   . Backache, unspecified   . Pain in limb   . Osteoporosis, unspecified   . Herpes zoster with other nervous system complications(053.19)   . Anxiety state,  unspecified   . Anemia, unspecified   . Bradycardia     s/p PPM    Past Surgical History  Procedure Laterality Date  . Pacemaker insertion      MDT  . Appendectomy    . Cholecystectomy    . Tonsillectomy    . Vaginal hysterectomy    . Cataract extraction, bilateral  end of June 2013  . Cataract extraction, bilateral  beginning of July 2013    Terrace Arabia RD, LDN

## 2013-05-01 NOTE — Progress Notes (Signed)
Physical Therapy Treatment Patient Details Name: Kayla Davis MRN: [id number] DOB: 1922/07/24 Today's Date: 05/01/2013 Time: 6256-3893 PT Time Calculation (min): 19 min  PT Assessment / Plan / Recommendation  History of Present Illness 80 yo female admitted with bil Pna, fall, back pain. Hx of HTN. Pt lives alone mostly.    PT Comments   2nd session to further assess gait and balance. Pt performed well. Feel she may be able to d/c home with increased supervision initially-recommend [place] aide if that is possible in addition to [place].   Follow Up Recommendations  Home health PT;Supervision/Assistance - 24 hour (home health aide; 24/7 initially)     Does the patient have the potential to tolerate intense rehabilitation     Barriers to Discharge        Equipment Recommendations  None recommended by PT    Recommendations for Other Services OT consult  Frequency Min 3X/week   Progress towards PT Goals Progress towards PT goals: Progressing toward goals  Plan Current plan remains appropriate    Precautions / Restrictions Precautions Precautions: Fall Restrictions Weight Bearing Restrictions: No   Pertinent Vitals/Pain Sore back and legs    Mobility  Bed Mobility Overal bed mobility: Modified Independent Transfers Overall transfer level: Needs assistance Transfers: Sit to/from Stand Sit to Stand: Min guard General transfer comment: close guard for safety Ambulation/Gait Ambulation/Gait assistance: Min guard Ambulation Distance (Feet): 135 Feet Assistive device: Straight cane Gait Pattern/deviations: Step-through pattern;Antalgic General Gait Details: close guard for safety. Pt reports she also holds onto furniture/door frames/bed post when using cane in home    Exercises     PT Diagnosis: Difficulty walking;Generalized weakness  PT Problem List: Decreased strength;Decreased range of motion;Decreased activity tolerance;Decreased balance;Decreased mobility PT Treatment  Interventions: DME instruction;Gait training;Functional mobility training;Therapeutic activities;Therapeutic exercise;Patient/family education;Balance training   PT Goals (current goals can now be found in the care plan section) Acute Rehab PT Goals Patient Stated Goal: home soon PT Goal Formulation: With patient/family Time For Goal Achievement: 05/15/13  Visit Information  Last PT Received On: 05/01/13 Assistance Needed: +1 History of Present Illness: 80 yo female admitted with bil Pna, fall, back pain. Hx of HTN. Pt lives alone mostly.     Subjective Data  Patient Stated Goal: home soon   Cognition  Cognition Arousal/Alertness: Awake/alert Behavior During Therapy: WFL for tasks assessed/performed Overall Cognitive Status: Within Functional Limits for tasks assessed    Balance  Balance Overall balance assessment: History of Falls;Needs assistance Sitting balance-Leahy Scale: Good Standing balance support: Single extremity supported Standing balance-Leahy Scale: Fair  End of Session PT - End of Session Equipment Utilized During Treatment: Gait belt Activity Tolerance: Patient tolerated treatment well Patient left: in bed;with call bell/phone within reach;with family/visitor present   GP     Weston Anna, MPT Pager: [phone]

## 2013-05-01 NOTE — Progress Notes (Signed)
[facility] PROGRESS NOTE  Kayla Davis [id number] DOB: [birth year] DOA: 04/29/2013 PCP: Noralee Space, MD  Brief narrative  80 y.o. female, with history of hypertension, dyslipidemia, hearing loss, hypothyroidism, irritable bowel syndrome, anxiety, who was being treated with Levaquin initially and then with nitrofurantoin for UTI for the last 7-10 days, had been feeling increasingly weak all over her body, on 1/15 , in the bathroom she had a fall, after which she's been having some dull lower back pain which actually is improving. On 1/16 she went to see her physicians where a L. spine x-ray was obtained which incidentally showed some changes in her lung fields after which chest x-ray was obtained suggesting pneumonia bilaterally. She was then referred to the ER for hospital admission.   Assessment/Plan:  community acquired pneumonia  Patient denies any fever or chills. Only symptoms she had was cough and weakness.  - Since pt was recently exposed to Levaquin and nitrofurantoin for UTI,  And since she is allergic to PCN , she was placed  On azithromycin and meropenem dose per pharmacy.  Prn nebs  Follow urine for legionella Symptoms improving  UTI.  Completed her last nitrofurantoin dose on 1/16. repeat UA shows UTI. Follow with culture.   Generalized weakness  due to UTI-CAP with fall in the bathroom on 1/15 . L-spine x-ray does not show any acute fracture,  continue IV fluids. PT and nutrition consult   History of bradycardia status post pacemaker placement.  No acute issues   Hypothyroidism  TSH elevated to 5.8 . Will increase synthroid dose  DVT Prophylaxis sq Heparin   Code Status: full code  Family Communication: sons and daughter at bedside  Disposition Plan:pending PT eval recommends [place] vs SNF. Will reassess tomorrow  Consultants:  None  Procedures:  None  Antibiotics:  Meropenem and azithromycin 1/16>>    HPI/Subjective: Feels better overall.    Objective: Filed Vitals:   05/01/13 0524  BP: 143/41  Pulse: 60  Temp: 98.1 F (36.7 C)  Resp: 18    Intake/Output Summary (Last 24 hours) at 05/01/13 1406 Last data filed at 05/01/13 0037  Gross per 24 hour  Intake 1402.5 ml  Output   1500 ml  Net  -97.5 ml   Filed Weights   04/29/13 1935 04/29/13 2243  Weight: 68.04 kg (150 lb) 68.72 kg (151 lb 8 oz)   Exam: General: Elderly thin built female in NAD  HEENT: no pallor, moist oral mucosa  chest: clear b/l , Fine bibasilar crackles  CVS: NS1&S2, no murmurs, rubs or gallop  Abd: soft,NT, ND, BS+  Ext: warm, no edema  CNS: AAOX3       Data Reviewed: Basic Metabolic Panel:  Recent Labs Lab 04/29/13 1723 04/29/13 2030 04/30/13 0540  NA 134* 134* 134*  K 3.4* 3.4* 3.9  CL 100 96 98  CO2 26 24 24   GLUCOSE 163* 175* 135*  BUN 16 18 13   CREATININE 0.8 0.69 0.64  CALCIUM 8.7 8.9 8.3*   Liver Function Tests: No results found for this basename: AST, ALT, ALKPHOS, BILITOT, PROT, ALBUMIN,  in the last 168 hours No results found for this basename: LIPASE, AMYLASE,  in the last 168 hours No results found for this basename: AMMONIA,  in the last 168 hours CBC:  Recent Labs Lab 04/29/13 1723 04/29/13 2030 04/30/13 0540 05/01/13 0512  WBC 10.9* 12.1* 13.0* 8.0  NEUTROABS 7.0 7.5  --   --   HGB 11.0* 11.0* 9.6* 9.1*  HCT 32.6* 31.9* 29.0* 28.2*  MCV 92.9 91.9 91.5 93.1  PLT 252.0 234 217 227   Cardiac Enzymes: No results found for this basename: CKTOTAL, CKMB, CKMBINDEX, TROPONINI,  in the last 168 hours BNP (last 3 results) No results found for this basename: PROBNP,  in the last 8760 hours CBG: No results found for this basename: GLUCAP,  in the last 168 hours  Recent Results (from the past 240 hour(s))  URINE CULTURE     Status: None   Collection Time    04/29/13  8:20 PM      Result Value Range Status   Specimen Description URINE, RANDOM   Final   Special Requests NONE   Final   Culture  Setup  Time     Final   Value: 04/30/2013 00:38     Performed at [place] [facility]     Final   Value: >=100,000 COLONIES/ML     Performed at [facility]   Culture     Final   Value: Multiple bacterial morphotypes present, none predominant. Suggest appropriate recollection if clinically indicated.     Performed at [facility]   Report Status 05/01/2013 FINAL   Final     Studies: Dg Chest 2 View  04/29/2013   CLINICAL DATA:  Cough and congestion. Right-sided rib pain. Recent history of fall.  EXAM: CHEST  2 VIEW  COMPARISON:  Chest x-ray 01/13/2013.  FINDINGS: Coarse interstitial markings throughout the mid to lower lungs bilaterally. Potential airspace consolidation in the lower lobes. Mild blunting of the costophrenic sulci bilaterally may suggest trace bilateral pleural effusions. No evidence of pulmonary edema. Heart size is normal. Mediastinal contours are mildly distorted by patient's rotation to the right, but are otherwise within normal limits. Atherosclerosis in the thoracic aorta. Right-sided pacemaker device in place with lead tips projecting over the expected location of the right atrium and right ventricular apex. No definite acute displaced rib fractures are identified on this two view examination.  IMPRESSION: 1. Patchy airspace disease and coarse interstitial markings throughout the lung bases bilaterally. These findings may reflect sequela of recent aspiration, or developing infection. 2. Potential trace bilateral pleural effusions. 3. Atherosclerosis. 4. No definite acute displaced rib fractures.   Electronically Signed   By: Vinnie Langton M.D.   On: 04/29/2013 17:20   Dg Lumbar Spine 2-3 Views  04/29/2013   CLINICAL DATA:  History of fall complaining of low back pain.  EXAM: LUMBAR SPINE - 2-3 VIEW  COMPARISON:  CT of the abdomen and pelvis 10/10/2005.  FINDINGS: Three views of the lumbar spine demonstrate no acute displaced fracture or compression  type fracture. 9 mm of anterolisthesis of L5 upon S1. Severe multilevel degenerative disc disease and facet arthropathy, most pronounced at L4-L5 and L5-S1. Mild dextroscoliosis of the lumbar spine, convex to the right at the level of L3.  IMPRESSION: 1. No acute radiographic abnormality of the lumbar spine. 2. Severe multilevel degenerative disc disease, lumbar spondylosis and dextroscoliosis, as above.   Electronically Signed   By: Vinnie Langton M.D.   On: 04/29/2013 17:27    Scheduled Meds: . aspirin  325 mg Oral Daily  . azithromycin  500 mg Intravenous QHS  . docusate sodium  200 mg Oral QHS  . feeding supplement (ENSURE COMPLETE)  237 mL Oral Q24H  . FLORA-Q  1 capsule Oral Daily  . heparin  5,000 Units Subcutaneous Q8H  . hydrochlorothiazide  12.5 mg Oral  Daily  . levothyroxine  125 mcg Oral QAC breakfast  . losartan  50 mg Oral Daily  . meropenem (MERREM) IV  1 g Intravenous Q12H  . pregabalin  75 mg Oral QHS  . simvastatin  40 mg Oral QHS   Continuous Infusions:     Time spent: 25 minutes    Samentha Perham, [place]  [facility] Pager [phone]. If 7PM-7AM, please contact night-coverage at [email or web address], password [facility] 05/01/2013, 2:06 PM  LOS: 2 days

## 2013-05-02 DIAGNOSIS — R5381 Other malaise: Secondary | ICD-10-CM | POA: Diagnosis not present

## 2013-05-02 DIAGNOSIS — J189 Pneumonia, unspecified organism: Secondary | ICD-10-CM | POA: Diagnosis not present

## 2013-05-02 MED ORDER — ENSURE COMPLETE PO LIQD: 237.0000 mL | ORAL | Status: DC

## 2013-05-02 MED ORDER — LEVOTHYROXINE SODIUM 150 MCG PO TABS: 150.0000 ug | ORAL_TABLET | Freq: Every day | ORAL | Status: DC

## 2013-05-02 MED ORDER — LEVOFLOXACIN 500 MG PO TABS: 500.0000 mg | ORAL_TABLET | Freq: Every day | ORAL | Status: DC

## 2013-05-02 NOTE — Progress Notes (Signed)
Discharge instructions given to pt/son, verbalized understanding. Left the unit in stable condition.

## 2013-05-02 NOTE — Progress Notes (Signed)
I met with pt and her son at bedside to discuss d/c planning. She has used [facility] in the past and wishes to use them for [facility] services again. She will need HHRN/PT/OT and aide. MD needs to order these services. Debbie with [facility] has been made aware if this. Pt's son also asked for information about private duty services. I provided them with the list and made them aware that these services are not covered by her health insurance and they have to set up the services themselves.  Allene Dillon RN BSN  [phone]

## 2013-05-02 NOTE — Progress Notes (Signed)
ANTIBIOTIC CONSULT NOTE - FOLLOW UP  Pharmacy Consult for meropenem Indication: pneumonia, UTI  Allergies  Allergen Reactions  . Penicillins Anaphylaxis    REACTION: swells up all over body, pt states she almost died  . Alprazolam      REACTION: eyes swell up  . Bactrim [Sulfamethoxazole-Trimethoprim]     Unable to remember the side effects  . Ceclor [Cefaclor]     Unsure of the side effects  . Cephalexin     Unsure of the side effects  . Clindamycin/Lincomycin     Upset stomach  . Hydrocodone     Unsure of reaction to med  . Noroxin [Norfloxacin]     Unsure of side effects  . Sulfonamide Derivatives     REACTION: itching  . Tetracyclines & Related     Unsure of the reaction to this medication    Patient Measurements: Height: 5\' 5"  (165.1 cm) Weight: 152 lb 8.9 oz (69.2 kg) IBW/kg (Calculated) : 57   Vital Signs: Temp: 98.1 F (36.7 C) (01/19 0650) Temp src: Oral (01/19 0650) BP: 143/54 mmHg (01/19 0650) Pulse Rate: 60 (01/19 0650) Intake/Output from previous day: 01/18 0701 - 01/19 0700 In: 300 [P.O.:300] Out: -  Intake/Output from this shift:    Labs:  Recent Labs  04/29/13 1723 04/29/13 2030 04/30/13 0540 05/01/13 0512  WBC 10.9* 12.1* 13.0* 8.0  HGB 11.0* 11.0* 9.6* 9.1*  PLT 252.0 234 217 227  CREATININE 0.8 0.69 0.64  --    Estimated Creatinine Clearance: 47.5 ml/min (by C-G formula based on Cr of 0.64).    Microbiology: Recent Results (from the past 720 hour(s))  URINE CULTURE     Status: None   Collection Time    04/29/13  8:20 PM      Result Value Range Status   Specimen Description URINE, RANDOM   Final   Special Requests NONE   Final   Culture  Setup Time     Final   Value: 04/30/2013 00:38     Performed at [place]     Final   Value: >=100,000 COLONIES/ML     Performed at [facility]   Culture     Final   Value: Multiple bacterial morphotypes present, none predominant. Suggest appropriate  recollection if clinically indicated.     Performed at [facility]   Report Status 05/01/2013 FINAL   Final    Anti-infectives   Start     Dose/Rate Route Frequency Ordered Stop   04/29/13 2315  meropenem (MERREM) 1 g in sodium chloride 0.9 % 100 mL IVPB     1 g 200 mL/hr over 30 Minutes Intravenous Every 12 hours 04/29/13 2301     04/29/13 2300  azithromycin (ZITHROMAX) 500 mg in dextrose 5 % 250 mL IVPB     500 mg 250 mL/hr over 60 Minutes Intravenous Daily at bedtime 04/29/13 2252        Assessment: 80 y/o F on D#4 meropenem 1g IV q12h / azithromycin 500 mg IV q24h for pneumonia and UTI in setting of allergy to penicillin and multiple cephalosporins and recent exposure to levofloxacin and nitrofurantoin.     Urine culture growing multiple morphotypes, none predominant.  Leukocytosis resolved.  SCr stable.  Goal of Therapy:  Appropriate dosage of meropenem for renal function; eradication of infection.  Plan:  1. No change to current meropenem dosage (1g IV q12h) 2. Await word on planned duration of therapy.  Louie Casa  Eleasha Cataldo, PharmD, BCPS Pager: [phone] 05/02/2013  12:42 PM

## 2013-05-02 NOTE — Discharge Summary (Addendum)
Physician Discharge Summary  Kayla Davis [id number] DOB: Jan 22, 1923 DOA: 04/29/2013  PCP: Noralee Space, MD  Admit date: 04/29/2013 Discharge date: 05/02/2013  Time spent: 40 minutes  Recommendations for Outpatient Follow-up:  1. Home with HHRN, PT and aide  Discharge Diagnoses:  Principal Problem:   CAP (community acquired pneumonia)  Active Problems:   HYPERLIPIDEMIA   HYPERTENSION   SINUS BRADYCARDIA   Pacemaker-Medtronic   Weakness generalized   Discharge Condition: fair  Diet recommendation: low sodium  Filed Weights   04/29/13 1935 04/29/13 2243 05/02/13 0429  Weight: 68.04 kg (150 lb) 68.72 kg (151 lb 8 oz) 69.2 kg (152 lb 8.9 oz)    History of present illness:  Please refer to admission H&P for details, but in brief, 80 y.o. female, with history of hypertension, dyslipidemia, hearing loss, hypothyroidism, irritable bowel syndrome, anxiety, who was being treated with Levaquin initially and then with nitrofurantoin for UTI for the last 7-10 days, had been feeling increasingly weak all over her body, on 1/15 , in the bathroom she had a fall, after which she's been having some dull lower back pain which actually is improving. On 1/16 she went to see her physicians where a L. spine x-ray was obtained which incidentally showed some changes in her lung fields after which chest x-ray was obtained suggesting pneumonia bilaterally. She was then referred to the ER for hospital admission.    Hospital Course:   community acquired pneumonia  Patient denies any fever or chills. Only symptoms she had was cough and weakness.  - Since pt was recently exposed to Levaquin and nitrofurantoin for UTI, And since she is allergic to PCN , she was placed On azithromycin and meropenem dose per pharmacy.  Prn nebs   urine for legionella negative Patient clinically stable and improved. Will discharge on 5 more days of po Levaquin ( as per son patient had not filled the prescription of  outpt Levaquin)  UTI.  Completed her last nitrofurantoin dose on 1/16. repeat UA shows UTI. Follow with culture showing mixed bacteria. patient asymptomatic. Will hold further abx   Generalized weakness  due to UTI/ CAP with fall in the bathroom on 1/15 . L-spine x-ray does not show any acute fracture,  PT consult recommended home vs SNF. Patient and famiyl wishes to go home with services.   History of bradycardia status post pacemaker placement.  No acute issues   Hypothyroidism  TSH elevated to 5.8 . Will increase synthroid dose to 150 mcg daily   Code Status: full code  Family Communication: sons and daughter at bedside   Disposition Plan:pending PT eval recommends [place] vs SNF. Will reassess tomorrow   Consultants:  None  Procedures:  None  Antibiotics:  Meropenem and azithromycin 1/16>>      Discharge Exam: Filed Vitals:   05/02/13 1329  BP: 153/66  Pulse: 60  Temp: 97.9 F (36.6 C)  Resp: 16    General: Elderly thin built female in NAD  HEENT: no pallor, moist oral mucosa  chest: clear b/l , no added sounds CVS: NS1&S2, no murmurs, rubs or gallop  Abd: soft,NT, ND, BS+  Ext: warm, no edema  CNS: AAOX3   Discharge Instructions   Future Appointments Provider Department Dept Phone   06/06/2013 9:00 AM [facility] [place] Office [phone]   06/21/2013 11:00 AM Noralee Space, MD [facility] [phone]   08/29/2013 1:30 PM Burtis Junes, NP [place] Office [phone]  Medication List         alendronate 70 MG tablet  Commonly known as:  FOSAMAX  Take 70 mg by mouth once a week. Take with a full glass of water on an empty stomach on Thursdays     Alpha-Lipoic Acid 200 MG Caps  Take 1 capsule by mouth daily.     aspirin 325 MG tablet  Take 325 mg by mouth daily.     B-12 2500 MCG Tabs  Take 1 tablet by mouth daily.     CALTRATE 600+D 600-400 MG-UNIT per tablet  Generic drug:   Calcium Carbonate-Vitamin D  Take 1 tablet by mouth daily.     cholecalciferol 1000 UNITS tablet  Commonly known as:  VITAMIN D  Take 1,000 Units by mouth daily.     co-enzyme Q-10 30 MG capsule  Take 30 mg by mouth daily.     docusate sodium 100 MG capsule  Commonly known as:  COLACE  Take 200 mg by mouth at bedtime.     feeding supplement (ENSURE COMPLETE) Liqd  Take 237 mLs by mouth daily.     fish oil-omega-3 fatty acids 1000 MG capsule  Take 1 g by mouth daily.     guaiFENesin 600 MG 12 hr tablet  Commonly known as:  MUCINEX  Take 600 mg by mouth 2 (two) times daily as needed for to loosen phlegm.     levofloxacin 500 MG tablet  Commonly known as:  LEVAQUIN  Take 1 tablet (500 mg total) by mouth daily.( until 1/23)     levothyroxine 150 MCG tablet  Commonly known as:  SYNTHROID, LEVOTHROID  Take 150 mcg by mouth daily before breakfast.     losartan-hydrochlorothiazide 50-12.5 MG per tablet  Commonly known as:  HYZAAR  Take 1/2 tablet by mouth every morning              OSTEO BI-FLEX ADV DOUBLE ST Tabs  Take 1 tablet by mouth 2 (two) times daily.     polyethylene glycol packet  Commonly known as:  MIRALAX / GLYCOLAX  Take 17 g by mouth as needed.     pregabalin 75 MG capsule  Commonly known as:  LYRICA  Take 75 mg by mouth at bedtime.     PROBIOTIC FORMULA Caps  Take 1 capsule by mouth daily.     simvastatin 40 MG tablet  Commonly known as:  ZOCOR  Take 40 mg by mouth at bedtime.       Allergies  Allergen Reactions  . Penicillins Anaphylaxis    REACTION: swells up all over body, pt states she almost died  . Alprazolam      REACTION: eyes swell up  . Bactrim [Sulfamethoxazole-Trimethoprim]     Unable to remember the side effects  . Ceclor [Cefaclor]     Unsure of the side effects  . Cephalexin     Unsure of the side effects  . Clindamycin/Lincomycin     Upset stomach  . Hydrocodone     Unsure of reaction to med  . Noroxin [Norfloxacin]      Unsure of side effects  . Sulfonamide Derivatives     REACTION: itching  . Tetracyclines & Related     Unsure of the reaction to this medication       Follow-up Information   Follow up with NADEL,SCOTT M, MD In 1 week.   Specialty:  Pulmonary Disease   Contact information:   [place] [address]  [phone]        The results of significant diagnostics from this hospitalization (including imaging, microbiology, ancillary and laboratory) are listed below for reference.    Significant Diagnostic Studies: Dg Chest 2 View  04/29/2013   CLINICAL DATA:  Cough and congestion. Right-sided rib pain. Recent history of fall.  EXAM: CHEST  2 VIEW  COMPARISON:  Chest x-ray 01/13/2013.  FINDINGS: Coarse interstitial markings throughout the mid to lower lungs bilaterally. Potential airspace consolidation in the lower lobes. Mild blunting of the costophrenic sulci bilaterally may suggest trace bilateral pleural effusions. No evidence of pulmonary edema. Heart size is normal. Mediastinal contours are mildly distorted by patient's rotation to the right, but are otherwise within normal limits. Atherosclerosis in the thoracic aorta. Right-sided pacemaker device in place with lead tips projecting over the expected location of the right atrium and right ventricular apex. No definite acute displaced rib fractures are identified on this two view examination.  IMPRESSION: 1. Patchy airspace disease and coarse interstitial markings throughout the lung bases bilaterally. These findings may reflect sequela of recent aspiration, or developing infection. 2. Potential trace bilateral pleural effusions. 3. Atherosclerosis. 4. No definite acute displaced rib fractures.   Electronically Signed   By: Vinnie Langton M.D.   On: 04/29/2013 17:20   Dg Lumbar Spine 2-3 Views  04/29/2013   CLINICAL DATA:  History of fall complaining of low back pain.  EXAM: LUMBAR SPINE - 2-3 VIEW  COMPARISON:  CT of the  abdomen and pelvis 10/10/2005.  FINDINGS: Three views of the lumbar spine demonstrate no acute displaced fracture or compression type fracture. 9 mm of anterolisthesis of L5 upon S1. Severe multilevel degenerative disc disease and facet arthropathy, most pronounced at L4-L5 and L5-S1. Mild dextroscoliosis of the lumbar spine, convex to the right at the level of L3.  IMPRESSION: 1. No acute radiographic abnormality of the lumbar spine. 2. Severe multilevel degenerative disc disease, lumbar spondylosis and dextroscoliosis, as above.   Electronically Signed   By: Vinnie Langton M.D.   On: 04/29/2013 17:27    Microbiology: Recent Results (from the past 240 hour(s))  URINE CULTURE     Status: None   Collection Time    04/29/13  8:20 PM      Result Value Range Status   Specimen Description URINE, RANDOM   Final   Special Requests NONE   Final   Culture  Setup Time     Final   Value: 04/30/2013 00:38     Performed at [place]     Final   Value: >=100,000 COLONIES/ML     Performed at [facility]   Culture     Final   Value: Multiple bacterial morphotypes present, none predominant. Suggest appropriate recollection if clinically indicated.     Performed at [facility]   Report Status 05/01/2013 FINAL   Final     Labs: Basic Metabolic Panel:  Recent Labs Lab 04/29/13 1723 04/29/13 2030 04/30/13 0540  NA 134* 134* 134*  K 3.4* 3.4* 3.9  CL 100 96 98  CO2 26 24 24   GLUCOSE 163* 175* 135*  BUN 16 18 13   CREATININE 0.8 0.69 0.64  CALCIUM 8.7 8.9 8.3*   Liver Function Tests: No results found for this basename: AST, ALT, ALKPHOS, BILITOT, PROT, ALBUMIN,  in the last 168 hours No results found for this basename: LIPASE, AMYLASE,  in the last 168 hours No results found for this basename:  AMMONIA,  in the last 168 hours CBC:  Recent Labs Lab 04/29/13 1723 04/29/13 2030 04/30/13 0540 05/01/13 0512  WBC 10.9* 12.1* 13.0* 8.0  NEUTROABS 7.0  7.5  --   --   HGB 11.0* 11.0* 9.6* 9.1*  HCT 32.6* 31.9* 29.0* 28.2*  MCV 92.9 91.9 91.5 93.1  PLT 252.0 234 217 227   Cardiac Enzymes: No results found for this basename: CKTOTAL, CKMB, CKMBINDEX, TROPONINI,  in the last 168 hours BNP: BNP (last 3 results) No results found for this basename: PROBNP,  in the last 8760 hours CBG: No results found for this basename: GLUCAP,  in the last 168 hours     Signed:  Louellen Molder  [facility] 05/02/2013, 2:54 PM

## 2013-05-02 NOTE — Progress Notes (Signed)
Clinical Social Work Department BRIEF PSYCHOSOCIAL ASSESSMENT 05/02/2013  Patient:  Kayla, Davis     Account Number:  [id number]     Admit date:  04/29/2013  Clinical Social Worker:  Earlie Server  Date/Time:  05/02/2013 10:00 AM  Referred by:  Physician  Date Referred:  05/02/2013 Referred for  SNF Placement   Other Referral:   Interview type:  Patient Other interview type:    PSYCHOSOCIAL DATA Living Status:  ALONE Admitted from facility:   Level of care:   Primary support name:  Kayla Davis Primary support relationship to patient:  CHILD, ADULT Degree of support available:   Adequate    CURRENT CONCERNS Current Concerns  Post-Acute Placement   Other Concerns:    SOCIAL WORK ASSESSMENT / PLAN CSW received referral to assist with DC planning. CSW reviewed chart and met with patient and dtr at bedside. Patient agreeable to dtr involvement during assessment. CSW introduced myself and explained role.    Patient reports she lives at home by herself but that son stays with her at night after he gets off of work. Patient reports that during the day her neighbors check in on her and that she uses a cane to ambulate. Patient reports she has had 1 fall in the past but still wants to return home. CSW and patient discussed options such as HH vs SNF. CSW explained insurance coverage for SNF and that patient could go for a few weeks to gain strength prior to returning home alone. Patient reports that although therapy is recommending 24 hour supervision that she feels she can manage on her own. Dtr in room and agreeable to this plan and reports no safety concerns with patient returning home. Patient has used [place] [facility] in the past. CSW made CM aware of possible HH needs and is signing off. CSW is available if further needs arise.   Assessment/plan status:  No Further Intervention Required Other assessment/ plan:   Information/referral to community resources:   SNF inforamtion     PATIENT'S/FAMILY'S RESPONSE TO PLAN OF CARE: Patient alert and oriented and engaged in assessment. Patient reports she is independent and fears if she goes to a SNF that she would lose that independence. Patient aware of options but feels she is managing well on her own. Patient reports that friends and neighbors are helpful and that she will be safe at home. Patient agreeable to [name].       Sindy Messing, LCSW (Coverage for [employer])

## 2013-05-03 DIAGNOSIS — J159 Unspecified bacterial pneumonia: Secondary | ICD-10-CM | POA: Diagnosis not present

## 2013-05-03 DIAGNOSIS — F411 Generalized anxiety disorder: Secondary | ICD-10-CM | POA: Diagnosis not present

## 2013-05-03 DIAGNOSIS — R269 Unspecified abnormalities of gait and mobility: Secondary | ICD-10-CM | POA: Diagnosis not present

## 2013-05-03 DIAGNOSIS — M5137 Other intervertebral disc degeneration, lumbosacral region: Secondary | ICD-10-CM | POA: Diagnosis not present

## 2013-05-03 DIAGNOSIS — N390 Urinary tract infection, site not specified: Secondary | ICD-10-CM | POA: Diagnosis not present

## 2013-05-03 DIAGNOSIS — I10 Essential (primary) hypertension: Secondary | ICD-10-CM | POA: Diagnosis not present

## 2013-05-05 ENCOUNTER — Telehealth: Payer: Self-pay | Admitting: Pulmonary Disease

## 2013-05-05 DIAGNOSIS — N390 Urinary tract infection, site not specified: Secondary | ICD-10-CM | POA: Diagnosis not present

## 2013-05-05 DIAGNOSIS — M5137 Other intervertebral disc degeneration, lumbosacral region: Secondary | ICD-10-CM | POA: Diagnosis not present

## 2013-05-05 DIAGNOSIS — I10 Essential (primary) hypertension: Secondary | ICD-10-CM | POA: Diagnosis not present

## 2013-05-05 DIAGNOSIS — R269 Unspecified abnormalities of gait and mobility: Secondary | ICD-10-CM | POA: Diagnosis not present

## 2013-05-05 DIAGNOSIS — J159 Unspecified bacterial pneumonia: Secondary | ICD-10-CM | POA: Diagnosis not present

## 2013-05-05 DIAGNOSIS — F411 Generalized anxiety disorder: Secondary | ICD-10-CM | POA: Diagnosis not present

## 2013-05-05 NOTE — Telephone Encounter (Signed)
Spoke with Kayla Davis and notified of recs per SN  She states that they did set up nursing visits and nurse came to check on her already and this will continue  She is already scheduled to see TP for 05/10/13  I added to appt comments will need cxr done prior

## 2013-05-05 NOTE — Telephone Encounter (Signed)
Spoke with pt daughter Nena Jordan. Pt was d/c from the hospital 05/02/13 for PNA. Per daughter pt is feeling weak, very little appetite, feels fatigue, very little cough, she feels cold. No wheezing, no chest tx, no fever, no body aches. Pt is on levaquin x 5 days. She is also drinking Glucerna and boost. Daughter is requesting recs. She is not sure if she should bring pt in with her just being dx w/ PNA. Please advise SN thanks  Allergies  Allergen Reactions  . Penicillins Anaphylaxis    REACTION: swells up all over body, pt states she almost died  . Alprazolam      REACTION: eyes swell up  . Bactrim [Sulfamethoxazole-Trimethoprim]     Unable to remember the side effects  . Ceclor [Cefaclor]     Unsure of the side effects  . Cephalexin     Unsure of the side effects  . Clindamycin/Lincomycin     Upset stomach  . Hydrocodone     Unsure of reaction to med  . Noroxin [Norfloxacin]     Unsure of side effects  . Sulfonamide Derivatives     REACTION: itching  . Tetracyclines & Related     Unsure of the reaction to this medication

## 2013-05-05 NOTE — Telephone Encounter (Signed)
Per SN---  Not unexpected after the PNA ?did they offer SNF for rehab?   Recs:  1.  Visiting nursing 2.  Finish abx 3.  Continue meds 4.  ROV next week with SN with cxr  Prior    1/28 at 3:30

## 2013-05-05 NOTE — Telephone Encounter (Signed)
[id number] x1 jim

## 2013-05-06 DIAGNOSIS — M5137 Other intervertebral disc degeneration, lumbosacral region: Secondary | ICD-10-CM | POA: Diagnosis not present

## 2013-05-06 DIAGNOSIS — F411 Generalized anxiety disorder: Secondary | ICD-10-CM | POA: Diagnosis not present

## 2013-05-06 DIAGNOSIS — J159 Unspecified bacterial pneumonia: Secondary | ICD-10-CM | POA: Diagnosis not present

## 2013-05-06 DIAGNOSIS — I10 Essential (primary) hypertension: Secondary | ICD-10-CM | POA: Diagnosis not present

## 2013-05-06 DIAGNOSIS — R269 Unspecified abnormalities of gait and mobility: Secondary | ICD-10-CM | POA: Diagnosis not present

## 2013-05-06 DIAGNOSIS — N390 Urinary tract infection, site not specified: Secondary | ICD-10-CM | POA: Diagnosis not present

## 2013-05-06 NOTE — Telephone Encounter (Signed)
I called and gave Jim VO. Nothing further needed

## 2013-05-06 NOTE — Telephone Encounter (Signed)
Please advise if ok to order OT for this patient? Thanks! [place] Bing, CMA

## 2013-05-06 NOTE — Telephone Encounter (Signed)
Ok to set up OT for the pt. thanks

## 2013-05-09 DIAGNOSIS — R269 Unspecified abnormalities of gait and mobility: Secondary | ICD-10-CM | POA: Diagnosis not present

## 2013-05-09 DIAGNOSIS — M5137 Other intervertebral disc degeneration, lumbosacral region: Secondary | ICD-10-CM | POA: Diagnosis not present

## 2013-05-09 DIAGNOSIS — I10 Essential (primary) hypertension: Secondary | ICD-10-CM | POA: Diagnosis not present

## 2013-05-09 DIAGNOSIS — F411 Generalized anxiety disorder: Secondary | ICD-10-CM | POA: Diagnosis not present

## 2013-05-09 DIAGNOSIS — J159 Unspecified bacterial pneumonia: Secondary | ICD-10-CM | POA: Diagnosis not present

## 2013-05-09 DIAGNOSIS — N390 Urinary tract infection, site not specified: Secondary | ICD-10-CM | POA: Diagnosis not present

## 2013-05-10 ENCOUNTER — Ambulatory Visit (INDEPENDENT_AMBULATORY_CARE_PROVIDER_SITE_OTHER)
Admission: RE | Admit: 2013-05-10 | Discharge: 2013-05-10 | Disposition: A | Discharge status: Home / Self Care | Payer: MEDICARE | Source: Ambulatory Visit | Attending: Adult Health | Admitting: Adult Health

## 2013-05-10 ENCOUNTER — Encounter: Payer: Self-pay | Admitting: Adult Health

## 2013-05-10 ENCOUNTER — Ambulatory Visit (INDEPENDENT_AMBULATORY_CARE_PROVIDER_SITE_OTHER): Payer: MEDICARE | Admitting: Adult Health

## 2013-05-10 VITALS — BP 126/68 | HR 72 | Temp 97.6°F | Ht 63.0 in | Wt 147.5 lb

## 2013-05-10 DIAGNOSIS — M5137 Other intervertebral disc degeneration, lumbosacral region: Secondary | ICD-10-CM | POA: Diagnosis not present

## 2013-05-10 DIAGNOSIS — J159 Unspecified bacterial pneumonia: Secondary | ICD-10-CM | POA: Diagnosis not present

## 2013-05-10 DIAGNOSIS — J984 Other disorders of lung: Secondary | ICD-10-CM | POA: Diagnosis not present

## 2013-05-10 DIAGNOSIS — J189 Pneumonia, unspecified organism: Secondary | ICD-10-CM

## 2013-05-10 DIAGNOSIS — F411 Generalized anxiety disorder: Secondary | ICD-10-CM | POA: Diagnosis not present

## 2013-05-10 DIAGNOSIS — I10 Essential (primary) hypertension: Secondary | ICD-10-CM | POA: Diagnosis not present

## 2013-05-10 DIAGNOSIS — R269 Unspecified abnormalities of gait and mobility: Secondary | ICD-10-CM | POA: Diagnosis not present

## 2013-05-10 DIAGNOSIS — N390 Urinary tract infection, site not specified: Secondary | ICD-10-CM | POA: Diagnosis not present

## 2013-05-10 NOTE — Progress Notes (Signed)
Subjective:    Patient ID: Kayla Davis, female    DOB: 31-Jan-1923, 80 y.o.   MRN: [id number]  HPI 27-  Recurrent bronchitic infections;  HBP;  Aortic valve dis;  Cardiac pacemaker;  Ven insuffic;  Hyperlipidemia;  DM;  Hypothyroid;  Divertics/ constip;  DJD/ LBP;  Osteopenia;  Hx TIA;  Periph neuropathy;  Anxiety;  Anemia; and hx Shingles...  04/29/13  Acute OV  Complains of fall yesterday am in bathroom , lost her balance pulling up her clothes. Has had a UTI, f/by urology. Says she was on Cipro but it was changed to macrobid 1 week ago. Has had nausea and weakness since then. Has sinus drainage, coughed up yellow mucus.  Has right rib pain and low back pain since fall. Gets a catch in her back when she moves around. No leg weakness of worsening urinary symptoms.  No fever, body aches, v/d, headache , visual/speech changes.  Took tylenol for pain yesterday.  <>refer to ER for PNA   05/10/2013 [facility] follow up  Patient returns for a post hospital followup. She was admitted January 16 through January 19 for a community-acquired pneumonia. She was treated with aggressive IV antibiotics. She was discharged on a complete course of antibiotics.with Levaquin. Discharge with home health and physical therapy at home cXR today shows some residual bibasilar infiltrates w/ some improved aeration.  Her TSH was elevated at 5.8, and her Synthroid was increased 150 mcg. Since discharge. She is feeling improved, however, continues to have some weakness, and residual cough She denies any hemoptysis, orthopnea, PND, or leg       Problem List:  HEARING LOSS (ICD-389.9) - she has bilat hearing aides & [name] has washed out her EACs...  Hx of BRONCHITIS, RECURRENT >> Hx Pneumonia >> ~  8/12: she had CAP in RLL treated w/ Avelox & slowly resolved back to baseline... ~  CXR 9/12 w/ sl pectus excavatum, mod thor spondylosis, mild cardiomeg, pacer, mild left base scarring, NAD...  HYPERTENSION  (ICD-401.9) - controlled on DIOVAN/Hct 80-12.5 daily...  ~  4/12:  BP=120/58 and tol med well... she denies HA, fatigue, visual changes, CP, palipit, ch in dyspnea, edema, etc... ~  1/13:  BP= 142/66 & she remains largely asymptomatic...  ~  6/13:  BP= 126/74 & feeling well w/o CP, palpit, dizzy, ch in SOB, edema; Creat=0.7, BNP=111. ~  6/14:  on DiovaHCT80-12.5; BP= 138/62 & she denies anginaCP, palpit, dizzy, ch in SOB, edema, etc.  CHEST WALL PAIN, HX OF (ICD-V15.89) - on ASA 332m/d... AORTIC VALVE DISORDERS (ICD-424.1)- s/p cardiac work up in 2001... currently denies CP etc... ~  NuclearStressTest 3/01 showed impaired exerc capacity & ST depression but no ischemia or infarction on images and EF=72%... ~  2DEcho 10/06 showed no regional wall motion abn, EF= 70%, mild incr AV thickness w/ reduced excursion, mild AI, sl decr RV function. ~  2DEcho 5/11 showed norm LV size & function, EF=55-60%, mild AI w/ sl thickening of valve, mild MR, mod TR, grI DD...  PACER for Pre-syncope>  Hosp 10/11 for presyncope w/ LBBB & pauses ==> pacer placed & she is now stable, asymptomatic, & followed in the device clinic by DrAllred... ~  10/12: she had f/u DrAllred, doing well, normal pacer function; she was complaining of leg weakness she though due to statin rx & they held it for a month... ~  She maintains regular f/u w/ drAllred et al & pacer function is normal....  VENOUS INSUFFICIENCY (ICD-459.81) -  she has mod VV, VI, & chr leg symptoms... good arterial pulses distally, no ischemic symptoms. ~  11/12:  Venous Dopplers done for leg pain were neg for DVT, no superfic thrombus or incompetence either...  HYPERLIPIDEMIA (ICD-272.4) - on SIMVASTATIN 56m/d & she has stopped it on several occas due to leg discomfort to see if this would help- it has never lead to improved leg symptoms!... also takes FISH OIL daily. ~  FLanc[place]4/08 on Simvastatin 418md showed TChol 171, TG 88, HDL 58, LDL 96... ~  FLKanorado/09 showed  TChol 220, TG 98, HDL 58, LDL 143... rec to restart her Simvast40. ~  10/10: not fasting at present & we discussed f/u FLP on ret in 2011 ~  FL[place]/11 showed TChol 176, TG 81, HDL 65, LDL 95 ~  FLP 4/12 showed TChol 189, TG 103, HDL 70, LDL 99 ~  FLP 6/13 on Simva40 showed TChol 161, TG 123, HDL 63, LDL 73 ~  FLP 6/14 on simva40 showed TChol 170, TG 83, HDL 59, LDL 95   DIABETES MELLITUS, BORDERLINE (ICD-790.29) - on diet alone...  ~  labs 4/08 w/ BS= 106... ~  labs 2/09 w/ BS= 170, HgA1c= 6.7...Marland KitchenMarland Kitchenec to start Metformin 50035m but she declined, prefer diet... ~  labs 6/09 showed BS= 97, HgA1c= 6.6... Marland KitchenMarland Kitchenc- continue diet Rx. ~  labs 3/10 showed BS= 129, A1c= 6.6 ~  labs 9/10 showed BS= 108 ~  labs 4/11 showed BS= 96 ~  Labs 4/12 showed BS= 89 ~  Labs 6/13 on diet alone showed BS= 86, A1c= 6.5 ~  Labs 6/14 on diet alone showed BS=101, A1c=6.6   HYPOTHYROIDISM (ICD-244.9) - on SYNTHROID 125m68m... ~  TSH 4/08 = 1.45... keep same dose. ~  TSH 2/09 = 0.17 on 125mc53mdose... rec to decr to 100mcg71m. ~  TSH 6/09 = 6.20 on 100mcg/52mse... she wants ret to 125mcg d79m ~  TSH 3/10 = 0.41 on 125mcg/d 66m. ~  TSH 4/11 = 0.24 on 125mcg/d d45m.. She doesn't want to decr the dose. ~  TSH 4/12 = 0.83 on 125mcg/d do21m  TSH 6/13 = 2.41 on 125mcg/d ~  50m 6/14 on Synthroid125 showed TSH= 5.24  DIVERTICULOSIS OF COLON (ICD-562.10) & CONSTIPATION (ICD-564.00) - we discussed a regimen to prevent constipation & she uses MIRALAX Prn... ~  Flex Sig 6/96 by DrSam showed divertics, otherw normal left colon...  DEGENERATIVE JOINT DISEASE (ICD-715.90) - she takes Glucosamine/ Chondroitin... LOW BACK PAIN & ?Neurogenic claudication in legs> ~  she persisits w/ mult somatic complaints--- she states she has tried off Simvastatin, Lyrica, Fosamax & others but nothing helps and her symptoms persist... XRays showed mild DJD both hips & lower spine; & BMD 5/09 showed min osteopenia w/ TScores -0.0 in Spine &  -1.4 in right FemNeck... she has tried Rx w/ Tramadol, Advil, Osteobiflex, heat, etc...  ~  eval by Ortho- DrRamos w/ LBP, multilevel DDD, spondylolisthesis L5-S1 & bilat leg pain> she's tried phys therapy, water exercises, meds (no benefit); epid steroid shots have helped some... ~  6/13:  Pt back on LYRICA 75mg Qhs at 79mrequest for neuropathy & improved, she says...  OSTEOPOROSIS (ICD-733.00) - on ALENDRONATE 70mg/wk, calc13m vitamins... ~  BMD here 5/09 showed TScores of 0.00 in Spine, and -1.4 in the right Fem Neck ~  BMD here 9/11 showed TScores of +0.8 in Spine, and -1.7 in the right FemNeck... [facility].  Hx of TRANSIENT ISCHEMIC ATTACK (  ICD-435.9) - she remains on ASA and has not had any new cerebral ischemic symptoms...  PERIPHERAL NEUROPATHY (ICD-356.9) - c/o chronic leg discomfort as noted... extensive work up in the past- DrStuckey 2001 w/ normal art dopplers; DrLove in 2001 w/ a neg NCV study;  DrAplington w/ meds and TENS trial (per her request- it helped her brother)... now DrRamos w/ PT, water exercises, and epid shots... tried Lyrica and now she thinks it's helping...  ANXIETY (ICD-300.00) - she declines anxiolytic Rx.  SHINGLES (ICD-053.9) - developed shingles Mar09 involving right sacral dermatomes w/ rash and pain in the perineal and buttock areas... treated via ER & several visits w/ NP w/ Valtrex, Pred, Lyrica, Vicodin... lesions resolved, some persist post-herpetic neuralgia...  ANEMIA, MILD (ICD-285.9) ~  labs 9/09 showed Hg= 11.8.[name]. ~  labs 3/10 showed Hg= 11.6, Fe= 64 (sat=20%) ~  labs 4/11 showed = Hg= 11.4 ~  Labs 4/12 showed Hg= 11.9 ~  Labs 6/13 showed Hg= 11.1, MCV=95  HEALTH MAINTENANCE:  she had the 2010 Flu shot 9/10... PNEUMOVAX in 2006 age age 79...   Past Surgical History  Procedure Laterality Date  . Pacemaker insertion      MDT  . Appendectomy    . Cholecystectomy    . Tonsillectomy    . Vaginal hysterectomy    . Cataract  extraction, bilateral  end of June 2013  . Cataract extraction, bilateral  beginning of July 2013    Outpatient Encounter Prescriptions as of 05/10/2013  Medication Sig  . alendronate (FOSAMAX) 70 MG tablet Take 70 mg by mouth once a week. Take with a full glass of water on an empty stomach on Thursdays  . Alpha-Lipoic Acid 200 MG CAPS Take 1 capsule by mouth daily.   [name] aspirin 325 MG tablet Take 325 mg by mouth daily.    . Calcium Carbonate-Vitamin D (CALTRATE 600+D) 600-400 MG-UNIT per tablet Take 1 tablet by mouth daily.    . cholecalciferol (VITAMIN D) 1000 UNITS tablet Take 1,000 Units by mouth daily.    [name] co-enzyme Q-10 30 MG capsule Take 30 mg by mouth daily.    . Cyanocobalamin (B-12) 2500 MCG TABS Take 1 tablet by mouth daily.   [name] docusate sodium (COLACE) 100 MG capsule Take 200 mg by mouth at bedtime.   . feeding supplement, ENSURE COMPLETE, (ENSURE COMPLETE) LIQD Take 237 mLs by mouth daily.  . fish oil-omega-3 fatty acids 1000 MG capsule Take 1 g by mouth daily.    [name] guaiFENesin (MUCINEX) 600 MG 12 hr tablet Take 600 mg by mouth 2 (two) times daily as needed for to loosen phlegm.   [name] levofloxacin (LEVAQUIN) 500 MG tablet Take 1 tablet (500 mg total) by mouth daily.  [name] levothyroxine (SYNTHROID, LEVOTHROID) 150 MCG tablet Take 1 tablet (150 mcg total) by mouth daily before breakfast.  . losartan-hydrochlorothiazide (HYZAAR) 50-12.5 MG per tablet Take 1/2 tablet by mouth every morning  . Misc Natural Products (OSTEO BI-FLEX ADV DOUBLE ST) TABS Take 1 tablet by mouth 2 (two) times daily.  . polyethylene glycol (MIRALAX / GLYCOLAX) packet Take 17 g by mouth as needed.  . pregabalin (LYRICA) 75 MG capsule Take 75 mg by mouth at bedtime.   . Probiotic Product (PROBIOTIC FORMULA) CAPS Take 1 capsule by mouth daily.    . simvastatin (ZOCOR) 40 MG tablet Take 40 mg by mouth at bedtime.    Allergies  Allergen Reactions  . Penicillins Anaphylaxis    REACTION: swells up all over  body, pt  states she almost died  . Alprazolam      REACTION: eyes swell up  . Bactrim [Sulfamethoxazole-Trimethoprim]     Unable to remember the side effects  . Ceclor [Cefaclor]     Unsure of the side effects  . Cephalexin     Unsure of the side effects  . Clindamycin/Lincomycin     Upset stomach  . Hydrocodone     Unsure of reaction to med  . Noroxin [Norfloxacin]     Unsure of side effects  . Sulfonamide Derivatives     REACTION: itching  . Tetracyclines & Related     Unsure of the reaction to this medication    Current Medications, Allergies, Past Medical History, Past Surgical History, Family History, and Social History were reviewed in [employer] record.    Review of Systems         See HPI - all other systems neg except as noted... The patient complains of decreased hearing, dyspnea on exertion, and difficulty walking.  The patient denies anorexia, fever, weight loss, weight gain, vision loss, hoarseness, chest pain, syncope, peripheral edema,headaches, hemoptysis, abdominal pain, melena, hematochezia, severe indigestion/heartburn, hematuria, incontinence, muscle weakness, suspicious skin lesions, transient blindness, depression, unusual weight change, abnormal bleeding, enlarged lymph nodes, and angioedema.    Objective:   Physical Exam      WD, WN, 80 y/o WF in NAD;  VS- reviewed. GENERAL:  Alert & oriented; pleasant & cooperative... HEENT:  [place]/AT, EACs-clear (she has bilat hearing aides), TMs-wnl, NOSE-clear, THROAT-clear & wnl. NECK:  Supple w/ fair ROM; no JVD; normal carotid impulses w/o bruits; no thyromegaly or nodules palpated; no lymphadenopathy. CHEST:  Clear to P & A; without wheezes/ rales/ or rhonchi heard; pacer in right upper shoulder area. HEART:  Regular gr 2/6 SEM S4 without rubs... ABDOMEN:  Soft & nontender; normal bowel sounds; no organomegaly or masses detected. No guarding or rebound.  EXT: without deformities, mild arthritic  changes; +varicose veins/ +venous insuffic/ no edema. Tender along right lateral ribs. And along low lumbar region.  MAEW. Has bruising along right hand, good hand grip, make a fist without difficulty.  NEURO:   no focal neuro deficits x decr sensation in LE's c/w periph neuropathy.  DERM:  No lesions noted; no rash etc..[name]

## 2013-05-10 NOTE — Patient Instructions (Signed)
Finish Levaquin as directed.  Mucinex DM Twice daily  As needed  Cough/congestion  Fluids and rest  Advance activity as tolerated.  Follow up Dr. Lenna Gilford  In 4 weeks with chest xray  Please contact office for sooner follow up if symptoms do not improve or worsen or seek emergency care

## 2013-05-11 DIAGNOSIS — M5137 Other intervertebral disc degeneration, lumbosacral region: Secondary | ICD-10-CM | POA: Diagnosis not present

## 2013-05-11 DIAGNOSIS — J159 Unspecified bacterial pneumonia: Secondary | ICD-10-CM | POA: Diagnosis not present

## 2013-05-11 DIAGNOSIS — R269 Unspecified abnormalities of gait and mobility: Secondary | ICD-10-CM | POA: Diagnosis not present

## 2013-05-11 DIAGNOSIS — I10 Essential (primary) hypertension: Secondary | ICD-10-CM | POA: Diagnosis not present

## 2013-05-11 DIAGNOSIS — N390 Urinary tract infection, site not specified: Secondary | ICD-10-CM | POA: Diagnosis not present

## 2013-05-11 DIAGNOSIS — F411 Generalized anxiety disorder: Secondary | ICD-10-CM | POA: Diagnosis not present

## 2013-05-12 DIAGNOSIS — N390 Urinary tract infection, site not specified: Secondary | ICD-10-CM | POA: Diagnosis not present

## 2013-05-12 DIAGNOSIS — I10 Essential (primary) hypertension: Secondary | ICD-10-CM | POA: Diagnosis not present

## 2013-05-12 DIAGNOSIS — M5137 Other intervertebral disc degeneration, lumbosacral region: Secondary | ICD-10-CM | POA: Diagnosis not present

## 2013-05-12 DIAGNOSIS — R269 Unspecified abnormalities of gait and mobility: Secondary | ICD-10-CM | POA: Diagnosis not present

## 2013-05-12 DIAGNOSIS — J159 Unspecified bacterial pneumonia: Secondary | ICD-10-CM | POA: Diagnosis not present

## 2013-05-12 DIAGNOSIS — F411 Generalized anxiety disorder: Secondary | ICD-10-CM | POA: Diagnosis not present

## 2013-05-12 NOTE — Assessment & Plan Note (Signed)
Clinically improving  Will need to finish abx and have cxr on return to document clearance   Plan  Finish Levaquin as directed.  Mucinex DM Twice daily  As needed  Cough/congestion  Fluids and rest  Advance activity as tolerated.  Follow up Dr. Lenna Gilford  In 4 weeks with chest xray  Please contact office for sooner follow up if symptoms do not improve or worsen or seek emergency care

## 2013-05-16 DIAGNOSIS — M5137 Other intervertebral disc degeneration, lumbosacral region: Secondary | ICD-10-CM | POA: Diagnosis not present

## 2013-05-16 DIAGNOSIS — R269 Unspecified abnormalities of gait and mobility: Secondary | ICD-10-CM | POA: Diagnosis not present

## 2013-05-16 DIAGNOSIS — J159 Unspecified bacterial pneumonia: Secondary | ICD-10-CM | POA: Diagnosis not present

## 2013-05-16 DIAGNOSIS — I10 Essential (primary) hypertension: Secondary | ICD-10-CM | POA: Diagnosis not present

## 2013-05-16 DIAGNOSIS — N390 Urinary tract infection, site not specified: Secondary | ICD-10-CM | POA: Diagnosis not present

## 2013-05-16 DIAGNOSIS — F411 Generalized anxiety disorder: Secondary | ICD-10-CM | POA: Diagnosis not present

## 2013-05-17 DIAGNOSIS — J159 Unspecified bacterial pneumonia: Secondary | ICD-10-CM | POA: Diagnosis not present

## 2013-05-17 DIAGNOSIS — R269 Unspecified abnormalities of gait and mobility: Secondary | ICD-10-CM | POA: Diagnosis not present

## 2013-05-17 DIAGNOSIS — F411 Generalized anxiety disorder: Secondary | ICD-10-CM | POA: Diagnosis not present

## 2013-05-17 DIAGNOSIS — M5137 Other intervertebral disc degeneration, lumbosacral region: Secondary | ICD-10-CM | POA: Diagnosis not present

## 2013-05-17 DIAGNOSIS — N390 Urinary tract infection, site not specified: Secondary | ICD-10-CM | POA: Diagnosis not present

## 2013-05-17 DIAGNOSIS — I10 Essential (primary) hypertension: Secondary | ICD-10-CM | POA: Diagnosis not present

## 2013-05-19 DIAGNOSIS — M5137 Other intervertebral disc degeneration, lumbosacral region: Secondary | ICD-10-CM | POA: Diagnosis not present

## 2013-05-19 DIAGNOSIS — F411 Generalized anxiety disorder: Secondary | ICD-10-CM | POA: Diagnosis not present

## 2013-05-19 DIAGNOSIS — I10 Essential (primary) hypertension: Secondary | ICD-10-CM | POA: Diagnosis not present

## 2013-05-19 DIAGNOSIS — R269 Unspecified abnormalities of gait and mobility: Secondary | ICD-10-CM | POA: Diagnosis not present

## 2013-05-19 DIAGNOSIS — J159 Unspecified bacterial pneumonia: Secondary | ICD-10-CM | POA: Diagnosis not present

## 2013-05-19 DIAGNOSIS — N390 Urinary tract infection, site not specified: Secondary | ICD-10-CM | POA: Diagnosis not present

## 2013-05-20 DIAGNOSIS — N390 Urinary tract infection, site not specified: Secondary | ICD-10-CM | POA: Diagnosis not present

## 2013-05-20 DIAGNOSIS — I10 Essential (primary) hypertension: Secondary | ICD-10-CM | POA: Diagnosis not present

## 2013-05-20 DIAGNOSIS — R269 Unspecified abnormalities of gait and mobility: Secondary | ICD-10-CM | POA: Diagnosis not present

## 2013-05-20 DIAGNOSIS — F411 Generalized anxiety disorder: Secondary | ICD-10-CM | POA: Diagnosis not present

## 2013-05-20 DIAGNOSIS — M5137 Other intervertebral disc degeneration, lumbosacral region: Secondary | ICD-10-CM | POA: Diagnosis not present

## 2013-05-20 DIAGNOSIS — J159 Unspecified bacterial pneumonia: Secondary | ICD-10-CM | POA: Diagnosis not present

## 2013-05-23 DIAGNOSIS — F411 Generalized anxiety disorder: Secondary | ICD-10-CM | POA: Diagnosis not present

## 2013-05-23 DIAGNOSIS — M5137 Other intervertebral disc degeneration, lumbosacral region: Secondary | ICD-10-CM | POA: Diagnosis not present

## 2013-05-23 DIAGNOSIS — J159 Unspecified bacterial pneumonia: Secondary | ICD-10-CM | POA: Diagnosis not present

## 2013-05-23 DIAGNOSIS — I10 Essential (primary) hypertension: Secondary | ICD-10-CM | POA: Diagnosis not present

## 2013-05-23 DIAGNOSIS — R269 Unspecified abnormalities of gait and mobility: Secondary | ICD-10-CM | POA: Diagnosis not present

## 2013-05-23 DIAGNOSIS — N390 Urinary tract infection, site not specified: Secondary | ICD-10-CM | POA: Diagnosis not present

## 2013-05-24 DIAGNOSIS — N390 Urinary tract infection, site not specified: Secondary | ICD-10-CM | POA: Diagnosis not present

## 2013-05-24 DIAGNOSIS — M5137 Other intervertebral disc degeneration, lumbosacral region: Secondary | ICD-10-CM | POA: Diagnosis not present

## 2013-05-24 DIAGNOSIS — R269 Unspecified abnormalities of gait and mobility: Secondary | ICD-10-CM | POA: Diagnosis not present

## 2013-05-24 DIAGNOSIS — F411 Generalized anxiety disorder: Secondary | ICD-10-CM | POA: Diagnosis not present

## 2013-05-24 DIAGNOSIS — J159 Unspecified bacterial pneumonia: Secondary | ICD-10-CM | POA: Diagnosis not present

## 2013-05-24 DIAGNOSIS — I10 Essential (primary) hypertension: Secondary | ICD-10-CM | POA: Diagnosis not present

## 2013-05-26 ENCOUNTER — Telehealth: Payer: Self-pay | Admitting: Cardiovascular Disease

## 2013-05-26 DIAGNOSIS — I10 Essential (primary) hypertension: Secondary | ICD-10-CM | POA: Diagnosis not present

## 2013-05-26 DIAGNOSIS — F411 Generalized anxiety disorder: Secondary | ICD-10-CM | POA: Diagnosis not present

## 2013-05-26 DIAGNOSIS — N390 Urinary tract infection, site not specified: Secondary | ICD-10-CM | POA: Diagnosis not present

## 2013-05-26 DIAGNOSIS — R269 Unspecified abnormalities of gait and mobility: Secondary | ICD-10-CM | POA: Diagnosis not present

## 2013-05-26 DIAGNOSIS — J159 Unspecified bacterial pneumonia: Secondary | ICD-10-CM | POA: Diagnosis not present

## 2013-05-26 DIAGNOSIS — M5137 Other intervertebral disc degeneration, lumbosacral region: Secondary | ICD-10-CM | POA: Diagnosis not present

## 2013-05-26 NOTE — Telephone Encounter (Signed)
Would like to know if her son can check her  Pacemaker on the 16th , instead of her having the remote check on the 23rd because her son will not be there .[name] Please Call    Thanks

## 2013-05-26 NOTE — Telephone Encounter (Signed)
Informed patient that date was changed to the 16th. Patient voiced understanding and was satisfied with this.

## 2013-05-29 ENCOUNTER — Other Ambulatory Visit: Payer: Self-pay | Admitting: Pulmonary Disease

## 2013-05-30 ENCOUNTER — Ambulatory Visit: Payer: MEDICARE | Admitting: *Deleted

## 2013-06-01 DIAGNOSIS — I10 Essential (primary) hypertension: Secondary | ICD-10-CM | POA: Diagnosis not present

## 2013-06-01 DIAGNOSIS — R269 Unspecified abnormalities of gait and mobility: Secondary | ICD-10-CM | POA: Diagnosis not present

## 2013-06-01 DIAGNOSIS — F411 Generalized anxiety disorder: Secondary | ICD-10-CM | POA: Diagnosis not present

## 2013-06-01 DIAGNOSIS — J159 Unspecified bacterial pneumonia: Secondary | ICD-10-CM | POA: Diagnosis not present

## 2013-06-01 DIAGNOSIS — N390 Urinary tract infection, site not specified: Secondary | ICD-10-CM | POA: Diagnosis not present

## 2013-06-01 DIAGNOSIS — M5137 Other intervertebral disc degeneration, lumbosacral region: Secondary | ICD-10-CM | POA: Diagnosis not present

## 2013-06-02 DIAGNOSIS — J159 Unspecified bacterial pneumonia: Secondary | ICD-10-CM | POA: Diagnosis not present

## 2013-06-02 DIAGNOSIS — R269 Unspecified abnormalities of gait and mobility: Secondary | ICD-10-CM | POA: Diagnosis not present

## 2013-06-02 DIAGNOSIS — F411 Generalized anxiety disorder: Secondary | ICD-10-CM | POA: Diagnosis not present

## 2013-06-02 DIAGNOSIS — N390 Urinary tract infection, site not specified: Secondary | ICD-10-CM | POA: Diagnosis not present

## 2013-06-02 DIAGNOSIS — I10 Essential (primary) hypertension: Secondary | ICD-10-CM | POA: Diagnosis not present

## 2013-06-02 DIAGNOSIS — M5137 Other intervertebral disc degeneration, lumbosacral region: Secondary | ICD-10-CM | POA: Diagnosis not present

## 2013-06-04 DIAGNOSIS — R55 Syncope and collapse: Secondary | ICD-10-CM | POA: Diagnosis not present

## 2013-06-04 LAB — MDC_IDC_ENUM_SESS_TYPE_REMOTE
Battery Remaining Longevity: 116 mo
Brady Statistic AP VP Percent: 18 %
Brady Statistic AS VP Percent: 82 %
Brady Statistic AS VS Percent: 0 %
Date Time Interrogation Session: 20150216212727
Lead Channel Impedance Value: 494 Ohm
Lead Channel Impedance Value: 540 Ohm
Lead Channel Pacing Threshold Amplitude: 0.5 V
Lead Channel Pacing Threshold Amplitude: 0.5 V
Lead Channel Pacing Threshold Pulse Width: 0.4 ms
Lead Channel Sensing Intrinsic Amplitude: 2.8 mV
MDC IDC MSMT BATTERY IMPEDANCE: 181 Ohm
MDC IDC MSMT BATTERY VOLTAGE: 2.78 V
MDC IDC MSMT LEADCHNL RV PACING THRESHOLD PULSEWIDTH: 0.4 ms
MDC IDC SET LEADCHNL RA PACING AMPLITUDE: 2 V
MDC IDC SET LEADCHNL RV PACING AMPLITUDE: 2.5 V
MDC IDC SET LEADCHNL RV PACING PULSEWIDTH: 0.4 ms
MDC IDC SET LEADCHNL RV SENSING SENSITIVITY: 5.6 mV
MDC IDC STAT BRADY AP VS PERCENT: 0 %

## 2013-06-08 DIAGNOSIS — I10 Essential (primary) hypertension: Secondary | ICD-10-CM | POA: Diagnosis not present

## 2013-06-08 DIAGNOSIS — R269 Unspecified abnormalities of gait and mobility: Secondary | ICD-10-CM | POA: Diagnosis not present

## 2013-06-08 DIAGNOSIS — M5137 Other intervertebral disc degeneration, lumbosacral region: Secondary | ICD-10-CM | POA: Diagnosis not present

## 2013-06-08 DIAGNOSIS — F411 Generalized anxiety disorder: Secondary | ICD-10-CM | POA: Diagnosis not present

## 2013-06-08 DIAGNOSIS — J159 Unspecified bacterial pneumonia: Secondary | ICD-10-CM | POA: Diagnosis not present

## 2013-06-08 DIAGNOSIS — N390 Urinary tract infection, site not specified: Secondary | ICD-10-CM | POA: Diagnosis not present

## 2013-06-10 DIAGNOSIS — J159 Unspecified bacterial pneumonia: Secondary | ICD-10-CM | POA: Diagnosis not present

## 2013-06-10 DIAGNOSIS — F411 Generalized anxiety disorder: Secondary | ICD-10-CM | POA: Diagnosis not present

## 2013-06-10 DIAGNOSIS — I10 Essential (primary) hypertension: Secondary | ICD-10-CM | POA: Diagnosis not present

## 2013-06-10 DIAGNOSIS — N390 Urinary tract infection, site not specified: Secondary | ICD-10-CM | POA: Diagnosis not present

## 2013-06-10 DIAGNOSIS — R269 Unspecified abnormalities of gait and mobility: Secondary | ICD-10-CM | POA: Diagnosis not present

## 2013-06-10 DIAGNOSIS — M5137 Other intervertebral disc degeneration, lumbosacral region: Secondary | ICD-10-CM | POA: Diagnosis not present

## 2013-06-13 ENCOUNTER — Encounter: Payer: Self-pay | Admitting: *Deleted

## 2013-06-14 DIAGNOSIS — R269 Unspecified abnormalities of gait and mobility: Secondary | ICD-10-CM | POA: Diagnosis not present

## 2013-06-14 DIAGNOSIS — F411 Generalized anxiety disorder: Secondary | ICD-10-CM | POA: Diagnosis not present

## 2013-06-14 DIAGNOSIS — N390 Urinary tract infection, site not specified: Secondary | ICD-10-CM | POA: Diagnosis not present

## 2013-06-14 DIAGNOSIS — I10 Essential (primary) hypertension: Secondary | ICD-10-CM | POA: Diagnosis not present

## 2013-06-14 DIAGNOSIS — J159 Unspecified bacterial pneumonia: Secondary | ICD-10-CM | POA: Diagnosis not present

## 2013-06-14 DIAGNOSIS — M5137 Other intervertebral disc degeneration, lumbosacral region: Secondary | ICD-10-CM | POA: Diagnosis not present

## 2013-06-16 ENCOUNTER — Telehealth: Payer: Self-pay | Admitting: Pulmonary Disease

## 2013-06-16 ENCOUNTER — Encounter: Payer: Self-pay | Admitting: Internal Medicine

## 2013-06-16 DIAGNOSIS — M5137 Other intervertebral disc degeneration, lumbosacral region: Secondary | ICD-10-CM | POA: Diagnosis not present

## 2013-06-16 DIAGNOSIS — F411 Generalized anxiety disorder: Secondary | ICD-10-CM | POA: Diagnosis not present

## 2013-06-16 DIAGNOSIS — I10 Essential (primary) hypertension: Secondary | ICD-10-CM | POA: Diagnosis not present

## 2013-06-16 DIAGNOSIS — R269 Unspecified abnormalities of gait and mobility: Secondary | ICD-10-CM | POA: Diagnosis not present

## 2013-06-16 DIAGNOSIS — J159 Unspecified bacterial pneumonia: Secondary | ICD-10-CM | POA: Diagnosis not present

## 2013-06-16 DIAGNOSIS — N390 Urinary tract infection, site not specified: Secondary | ICD-10-CM | POA: Diagnosis not present

## 2013-06-16 NOTE — Telephone Encounter (Signed)
Pt requesting to have labs drawn prior to 06/21/13 appt with SN. Pt wanting to come the day before if possible Pt requesting to have TSH added to routine labs. Also u/a Pt states that her thyroid meds have been adjusted since last OV.   Please advise Kayla Davis is these have been ordered and if okay to check TSH levels as well.

## 2013-06-20 NOTE — Telephone Encounter (Signed)
i called and made pt aware. Nothing further needed 

## 2013-06-20 NOTE — Telephone Encounter (Signed)
Pt has not eaten breakfast this morning. Still is waiting to hear from the nurse. Is she suppose to come in for blood work now?

## 2013-06-20 NOTE — Telephone Encounter (Signed)
Please let the pt know that SN will see her and then send her for labs tomorrow.  thanks

## 2013-06-21 ENCOUNTER — Encounter: Payer: Self-pay | Admitting: Pulmonary Disease

## 2013-06-21 ENCOUNTER — Other Ambulatory Visit (INDEPENDENT_AMBULATORY_CARE_PROVIDER_SITE_OTHER): Payer: MEDICARE

## 2013-06-21 ENCOUNTER — Ambulatory Visit (INDEPENDENT_AMBULATORY_CARE_PROVIDER_SITE_OTHER): Payer: MEDICARE | Admitting: Pulmonary Disease

## 2013-06-21 VITALS — BP 134/60 | HR 66 | Temp 96.7°F | Ht 64.0 in | Wt 149.4 lb

## 2013-06-21 DIAGNOSIS — J159 Unspecified bacterial pneumonia: Secondary | ICD-10-CM | POA: Diagnosis not present

## 2013-06-21 DIAGNOSIS — I10 Essential (primary) hypertension: Secondary | ICD-10-CM | POA: Diagnosis not present

## 2013-06-21 DIAGNOSIS — R609 Edema, unspecified: Secondary | ICD-10-CM

## 2013-06-21 DIAGNOSIS — M549 Dorsalgia, unspecified: Secondary | ICD-10-CM

## 2013-06-21 DIAGNOSIS — F411 Generalized anxiety disorder: Secondary | ICD-10-CM | POA: Diagnosis not present

## 2013-06-21 DIAGNOSIS — R7309 Other abnormal glucose: Secondary | ICD-10-CM | POA: Diagnosis not present

## 2013-06-21 DIAGNOSIS — E039 Hypothyroidism, unspecified: Secondary | ICD-10-CM | POA: Diagnosis not present

## 2013-06-21 DIAGNOSIS — M5137 Other intervertebral disc degeneration, lumbosacral region: Secondary | ICD-10-CM | POA: Diagnosis not present

## 2013-06-21 DIAGNOSIS — G609 Hereditary and idiopathic neuropathy, unspecified: Secondary | ICD-10-CM

## 2013-06-21 DIAGNOSIS — I359 Nonrheumatic aortic valve disorder, unspecified: Secondary | ICD-10-CM | POA: Diagnosis not present

## 2013-06-21 DIAGNOSIS — M1990 Unspecified osteoarthritis, unspecified site: Secondary | ICD-10-CM

## 2013-06-21 DIAGNOSIS — N390 Urinary tract infection, site not specified: Secondary | ICD-10-CM

## 2013-06-21 DIAGNOSIS — R32 Unspecified urinary incontinence: Secondary | ICD-10-CM

## 2013-06-21 DIAGNOSIS — I872 Venous insufficiency (chronic) (peripheral): Secondary | ICD-10-CM

## 2013-06-21 DIAGNOSIS — R269 Unspecified abnormalities of gait and mobility: Secondary | ICD-10-CM | POA: Diagnosis not present

## 2013-06-21 DIAGNOSIS — M810 Age-related osteoporosis without current pathological fracture: Secondary | ICD-10-CM

## 2013-06-21 DIAGNOSIS — E785 Hyperlipidemia, unspecified: Secondary | ICD-10-CM

## 2013-06-21 DIAGNOSIS — Z950 Presence of cardiac pacemaker: Secondary | ICD-10-CM

## 2013-06-21 LAB — CBC WITH DIFFERENTIAL/PLATELET
Basophils Absolute: 0.1 10*3/uL (ref 0.0–0.1)
Basophils Relative: 1.1 % (ref 0.0–3.0)
EOS PCT: 7.7 % — AB (ref 0.0–5.0)
Eosinophils Absolute: 0.4 10*3/uL (ref 0.0–0.7)
HEMATOCRIT: 33.3 % — AB (ref 36.0–46.0)
HEMOGLOBIN: 11.3 g/dL — AB (ref 12.0–15.0)
Lymphocytes Relative: 40.9 % (ref 12.0–46.0)
Lymphs Abs: 2 10*3/uL (ref 0.7–4.0)
MCHC: 33.9 g/dL (ref 30.0–36.0)
MCV: 93.3 fl (ref 78.0–100.0)
MONO ABS: 0.5 10*3/uL (ref 0.1–1.0)
MONOS PCT: 9.2 % (ref 3.0–12.0)
NEUTROS ABS: 2.1 10*3/uL (ref 1.4–7.7)
Neutrophils Relative %: 41.1 % — ABNORMAL LOW (ref 43.0–77.0)
Platelets: 197 10*3/uL (ref 150.0–400.0)
RBC: 3.57 Mil/uL — ABNORMAL LOW (ref 3.87–5.11)
RDW: 14.2 % (ref 11.5–14.6)
WBC: 5 10*3/uL (ref 4.5–10.5)

## 2013-06-21 LAB — URINALYSIS, ROUTINE W REFLEX MICROSCOPIC
Bilirubin Urine: NEGATIVE
Ketones, ur: NEGATIVE
NITRITE: POSITIVE — AB
PH: 6 (ref 5.0–8.0)
SPECIFIC GRAVITY, URINE: 1.015 (ref 1.000–1.030)
Total Protein, Urine: NEGATIVE
UROBILINOGEN UA: 0.2 (ref 0.0–1.0)
Urine Glucose: NEGATIVE

## 2013-06-21 LAB — BASIC METABOLIC PANEL
BUN: 17 mg/dL (ref 6–23)
CHLORIDE: 108 meq/L (ref 96–112)
CO2: 28 mEq/L (ref 19–32)
Calcium: 9.3 mg/dL (ref 8.4–10.5)
Creatinine, Ser: 0.7 mg/dL (ref 0.4–1.2)
GFR: 82.48 mL/min (ref >60.00)
GLUCOSE: 101 mg/dL — AB (ref 70–99)
POTASSIUM: 4.2 meq/L (ref 3.5–5.1)
Sodium: 141 mEq/L (ref 135–145)

## 2013-06-21 LAB — TSH: TSH: 0.22 u[IU]/mL — ABNORMAL LOW (ref 0.35–5.50)

## 2013-06-21 LAB — HEMOGLOBIN A1C: Hgb A1c MFr Bld: 6.6 % — ABNORMAL HIGH (ref 4.6–6.5)

## 2013-06-21 MED ORDER — EZETIMIBE 10 MG PO TABS: 10.0000 mg | ORAL_TABLET | Freq: Every day | ORAL | Status: DC

## 2013-06-21 NOTE — Progress Notes (Signed)
Subjective:    Patient ID: Kayla Davis, female    DOB: [birth year], 80 y.o.   MRN: [id number]  HPI 80 y/o WF here for a follow up visit... she has mult med problems including:  Recurrent bronchitic infections;  HBP;  Aortic valve dis;  Cardiac pacemaker;  Ven insuffic;  Hyperlipidemia;  DM;  Hypothyroid;  Divertics/ constip;  DJD/ LBP;  Osteopenia;  Hx TIA;  Periph neuropathy;  Anxiety;  Anemia; and hx Shingles...  ~  September 16, 2011:  37moROV & ETynesiais doing very well> she has a relative who is 78y/o still sharp/ ambulatory/ & makes her own clothes!  Her CC is LBP and neuropathy symptoms> she's received shots from Kayla Davis w/ help & restarted LYRICA 794mQhs per TP which really helps, she says;  Had UTI 4/13 treated & everything at baseline now...      We reviewed prob list, meds, xrays and labs> see below>> LABS 6/13:  FLP- at goals on Simva40;  Chems- ok w/ BS=86, A1c=6.5  CBC- ok x Hg=11.1;  TSH=2.41;  BNP=111...  ~  March 16, 2012:  50m21moV & Kayla Davis about the same, notes that it is hard to walk due to her leg discomfort, back hurts, knees hurt, etc; she refuses PT & we will not force the issue given her 89 36s... We reviewed the following medical problems during today's office visit >>     Hearing Loss> wears bilat hearing aides...    Hx Bronchitis> she has ZPak to keep on hand for prn use...    HBP> on DiovaHCT80-12.5; BP= 132/60 & she denies anginaCP, palpit, dizzy, ch in SOB, edema, etc...     Cards- CWP, AI, pacer> on ASA325; follwed by Kayla Davis & seen 11/13- stable, doing satis, pacer ok...    VenInsuffic> she knows to elim sodium, elev legs, wear support hose...    CHOL> on Simva40, FishOil, CoQ10; yearly FLP done 6/13 & all parameters at goals...    DM> on diet alone; yearly labs 6/13 were wnl- BS=86, A1c=6.5    Hypothy> on Synthroid125; yearly labs 6/13 confirmed euthyroid on this dose...    GI- Divertics, constip> on Miralax, Colace, Probiotics; she denies abd pain, n/v/d,  blood seen...    DJD, LBP, Osteopenia> on Fosamax70/wk, calcium, MVI, VitD1000; last BMD 9/11 showed TScore -1.7 in right FemMercy Davis Davis Hx TIA, Neuropathy> on ASA325, [id number] leg cramp med; she denies cerebral ischemic symptoms...    Anxiety> she declines anxiolytic Rx...    Anemia> Hg follow up = 11.7 w/ normal serum Fe etc... We reviewed prob list, meds, xrays and labs> see below for updates >> she had the 2013 Flu vaccine 9/13... LABS 12/13:  Chems- wnl;  CBC- mild anemia w/ Hg=11.7, MCV=94, Fe=83 (24%sat)...   ~  September 14, 2012:  50mo58mo & Kayla Davis been c/o LBP & getting ESI shots from Kayla Davis, has back brace & ambulates w/ a cane "I tough it out"... She also remains under the care of GYN- had pessary placed but she reports too uncomfortable & she took it out; she reports blood in urine treated w/ Ab & resolved=> we rechecked UA & C&S- clear, neg...     Hearing Loss> wears bilat hearing aides...    Hx Bronchitis> she has ZPak to keep on hand for prn use...    HBP> on DiovaHCT80-12.5; BP= 138/62 & she denies anginaCP, palpit, dizzy, ch in SOB, edema, etc...[name]  Cards- CWP, AI, pacer> on ASA325; followed by Kayla Davis & seen 2/14- stable, doing satis, pacer ok...    VenInsuffic> she knows to elim sodium, elev legs, wear support hose...    CHOL> on Simva40, FishOil, CoQ10; FLP 6/14 showed TChol 170, TG 83, HDL 59, LDL 95    DM> on diet alone; labs 6/14 were wnl- BS=101, A1c=6.6    Hypothy> on Synthroid125; labs 6/14 showed TSH= 5.24    GI- Divertics, constip> on Miralax, Colace, Probiotics; she denies abd pain, n/v/d, blood seen...    DJD, LBP, Osteopenia> on Fosamax70/wk, calcium, MVI, VitD1000; last BMD 9/11 showed TScore -1.7 in right Kayla Davis    Hx TIA, Neuropathy> on ASA325, [id number], homeopathic leg cramp med; she denies cerebral ischemic symptoms...    Anxiety> she declines anxiolytic Rx...    Anemia> Hg follow up = 11.7 w/ normal serum Fe etc... We reviewed prob list,  meds, xrays and labs> see below for updates >>  Urinalysis C&S> Urine is clear, C&S in no growth... LABS 6/14:  FLP- at goals on Simva40;  Chems- wnl w/ BS=101 A1c=6.6;  TSH=5.24... CBC was ordered, not done...   ~  March 22, 2013:  51moROV & Kayla Davis's CC= fatigue but then she notes "I do my housework & keep on goin"...  She notes Urinary leakage incont esp when she wakes up & tries to get to the BR; occurs in the AM & after a nap; reminded to empty bladder thoroughly on retiring & NOT to drink fluids for the several hours before lying down; recall that she had bladder tach yrs ago; I have rec a Urology eval 7 she wants to wait until after the holidays...  She also noted swelling in her legs, she had increased her salt intake on her own & was prev switched from DiovanHCT to plain Losartan for $$ reasons; she says she had some old HCTZ at home & started on that w/ improvement in edema noted; we discussed options and decided to change to LOSARTAN-HCT 50-12.5 & start w/ 1/2 tab Qam...     We reviewed prob list, meds, xrays and labs> see below for updates >> she had the 2014 Flu vaccine 10/14 & requests several refills today; she still take s a lot of supplements...  ADDENDUM>> Urinalysis w/ +nitrite, wbc, bact> c/w UTI, she has mult antibiotic allergies and sensitivities, she can tol cipro- call in Cipro250Bid #14, await C&S...  ~  June 21, 2013:  340moOV & post hosp visit> Kayla Davis Davis/15by Kayla Davis w/ Kayla Davis> she was on antibiotics for UTI, felt weak & fell in bathroom, had sl cough but denied fever or chills, CXR showed patchy airsp dis & coarse interstitial markings in lower lobes bilat; started on Zithromax & meropenum IV & disch on Levaquin orally...    BP controlled on Hyzaar 1/2 tab w/ BP= 134/60...    She refuses statin therapy, offered Zetia trial...    She is followed by [facility]- Kayla Davis recurrent UTIs, OB, and urge incont, large rectocele... rec to take Miralax, Colace, fiber &  trial of Myrbetriq...    She has LBP followed by Kayla Davis et al We reviewed prob list, meds, xrays and labs> see below for updates >>  LABS 3/15:  Chems- wnl w/ BS=101 A1c=6.6;  CBC- ok x Hg=11.3;  TSH= 0.22 on Synthroid150 so we decr to 125; UA pos for nitrite & WBC- treated w/ Cipro250Bid         Problem List:  HEARING LOSS (ICD-389.9) - she has bilat hearing aides & [name] has washed out her EACs...  Hx of BRONCHITIS, RECURRENT >> Hx Pneumonia >> ~  8/12: she had Kayla Davis in [facility] treated w/ Avelox & slowly resolved back to baseline... ~  CXR 9/12 w/ sl pectus excavatum, mod thor spondylosis, mild cardiomeg, pacer, mild left base scarring, NAD...  HYPERTENSION (ICD-401.9) - controlled on DIOVAN/Hct 80-12.5 daily...  ~  4/12:  BP=120/58 and tol med well... she denies HA, fatigue, visual changes, CP, palipit, ch in dyspnea, edema, etc... ~  1/13:  BP= 142/66 & she remains largely asymptomatic...  ~  6/13:  BP= 126/74 & feeling well w/o CP, palpit, dizzy, ch in SOB, edema; Creat=0.7, BNP=111. ~  6/14:  on DiovaHCT80-12.5; BP= 138/62 & she denies anginaCP, palpit, dizzy, ch in SOB, edema, etc. ~  12/14:  meds had been adjusted in the interval off diovan onto Losartan; she restarted some HCTZ she had at home; asked to throw this out 7 change to LOSARTAN-HCT 50-12.5 starting w/ 1/2 tab Qam...  CHEST WALL PAIN, HX OF (ICD-V15.89) - on ASA 34m/d... AORTIC VALVE DISORDERS (ICD-424.1)- s/p cardiac work up in 2001... currently denies CP etc... ~  NuclearStressTest 3/01 showed impaired exerc capacity & ST depression but no ischemia or infarction on images and EF=72%... ~  2DEcho 10/06 showed no regional wall motion abn, EF= 70%, mild incr AV thickness w/ reduced excursion, mild AI, sl decr RV function. ~  2DEcho 5/11 showed norm LV size & function, EF=55-60%, mild AI w/ sl thickening of valve, mild MR, mod TR, grI DD...  PACER for Pre-syncope>  Hosp 10/11 for presyncope w/ LBBB & pauses ==> pacer placed  & she is now stable, asymptomatic, & followed in the device clinic by Kayla Davis... ~  10/12: she had f/u Kayla Davis, doing well, normal pacer function; she was complaining of leg weakness she though due to statin rx & they held it for a month... ~  She maintains regular f/u w/ Kayla Davis et al & pacer function is normal.... ~  EKG 10/14 showed atrial sensed & ventric paced rhythm...  ~  11/14: she had f/u Kayla Davis> doing satis w/ her SSS, Brady, normal pacer function, no changes made...  VENOUS INSUFFICIENCY (ICD-459.81) - she has mod VV, VI, & chr leg symptoms... good arterial pulses distally, no ischemic symptoms. ~  11/12:  Venous Dopplers done for leg pain were neg for DVT, no superfic thrombus or incompetence either... ~  12/14:  Reminded to follow low sodium diet, elev legs, wear support hose, 7 we changed her med to LosarHCT 50-12.5 starting w/ 1/2 tab daily...  HYPERLIPIDEMIA (ICD-272.4) - on SIMVASTATIN 443md & she has stopped it on several occas due to leg discomfort to see if this would help- it has never lead to improved leg symptoms!... also takes FISH OIL daily. ~  [place] on Simvastatin 4028m showed TChol 171, TG 88, HDL 58, LDL 96... ~  FLPCrivitz09 showed TChol 220, TG 98, HDL 58, LDL 143... rec to restart her Simvast40. ~  10/10: not fasting at present & we discussed f/u FLP on ret in 2011 ~  FLPR[place]11 showed TChol 176, TG 81, HDL 65, LDL 95 ~  FLP 4/12 showed TChol 189, TG 103, HDL 70, LDL 99 ~  FLP 6/13 on Simva40 showed TChol 161, TG 123, HDL 63, LDL 73 ~  FLP 6/14 on simva40 showed TChol 170, TG 83, HDL 59, LDL 95   DIABETES MELLITUS,  BORDERLINE (ICD-790.29) - on diet alone...  ~  labs 4/08 w/ BS= 106... ~  labs 2/09 w/ BS= 170, HgA1c= 6.7.Marland KitchenMarland Kitchen rec to start Metformin 511m/d but she declined, prefer diet... ~  labs 6/09 showed BS= 97, HgA1c= 6.6..Marland KitchenMarland Kitchenrec- continue diet Rx. ~  labs 3/10 showed BS= 129, A1c= 6.6 ~  labs 9/10 showed BS= 108 ~  labs 4/11 showed BS= 96 ~  Labs 4/12  showed BS= 89 ~  Labs 6/13 on diet alone showed BS= 86, A1c= 6.5 ~  Labs 6/14 on diet alone showed BS=101, A1c=6.6  ~  9/14: Ophthalmology note from drStoneburner> no DM retinopathy....  HYPOTHYROIDISM (ICD-244.9) - on SYNTHROID 1243m/d... ~  TSH 4/08 = 1.45... keep same dose. ~  TSH 2/09 = 0.17 on 12565md dose... rec to decr to 100m83m... ~  TSH 6/09 = 6.20 on 100mc33mdose... she wants ret to 125mcg43me. ~  TSH 3/10 = 0.41 on 125mcg/61mse. ~  TSH 4/11 = 0.24 on 125mcg/d63me... She doesn't want to decr the dose. ~  TSH 4/12 = 0.83 on 125mcg/d 27m ~  TSH 6/13 = 2.41 on 125mcg/d ~79mbs 6/14 on Synthroid125 showed TSH= 5.24  DIVERTICULOSIS OF COLON (ICD-562.10) & CONSTIPATION (ICD-564.00) - we discussed a regimen to prevent constipation & she uses MIRALAX Prn... ~  Flex Sig 6/96 by DrSam showed divertics, otherw normal left colon...  URINARY INCONTINENCE/ LEAKAGE >>  ~  12/14: she mentioned this symptom including  DEGENERATIVE JOINT DISEASE (ICD-715.90) - she takes Glucosamine/ Chondroitin... LOW BACK PAIN & ?Neurogenic claudication in legs> ~  she persisits w/ mult somatic complaints--- she states she has tried off Simvastatin, Lyrica, Fosamax & others but nothing helps and her symptoms persist... XRays showed mild DJD both hips & lower spine; & BMD 5/09 showed min osteopenia w/ TScores -0.0 in Spine & -1.4 in right FemNeck... she has tried Rx w/ Tramadol, Advil, Osteobiflex, heat, etc...  ~  eval by Ortho- Kayla Davis w/ LBP, multilevel DDD, spondylolisthesis L5-S1 & bilat leg pain> she's tried phys therapy, water exercises, meds (no benefit); epid steroid shots have helped some... ~  6/13:  Pt back on LYRICA 75mg Qhs a12mr request for neuropathy & improved, she says... ~  12/14: she is taking Lyrica75Bid per Kayla Davis & tol well, but oddly she can't tol Tramadol50 "it made me feel funny for a short while"...  OSTEOPOROSIS (ICD-733.00) - on ALENDRONATE 70mg/wk, ca73mm, vitamins... ~   BMD here 5/09 showed TScores of 0.00 in Spine, and -1.4 in the right Fem Neck ~  BMD here 9/11 showed TScores of +0.8 in Spine, and -1.7 in the right FemNeck... rComprehensive Outpatient Surgeontinue same med.  Hx of TRANSIENT ISCHEMIC ATTACK (ICD-435.9) - she remains on ASA and has not had any new cerebral ischemic symptoms...  PERIPHERAL NEUROPATHY (ICD-356.9) - c/o chronic leg discomfort as noted... extensive work up in the past- DrStuckey 2001 w/ normal art dopplers; DrLove in 2001 w/ a neg NCV study;  DrAplington w/ meds and TENS trial (per her request- it helped her brother)... now Kayla Davis w/ PT, water exercises, and epid shots... tried Lyrica and now she thinks it's helping...  ANXIETY (ICD-300.00) - she declines anxiolytic Rx.  SHINGLES (ICD-053.9) - developed shingles Mar09 involving right sacral dermatomes w/ rash and pain in the perineal and buttock areas... treated via ER & several visits w/ NP w/ Valtrex, Pred, Lyrica, Vicodin... lesions resolved, some persist post-herpetic neuralgia...  ANEMIA, MILD (ICD-285.9) ~  labs 9/09  showed Hg= 11.8.[name]. ~  labs 3/10 showed Hg= 11.6, Fe= 64 (sat=20%) ~  labs 4/11 showed = Hg= 11.4 ~  Labs 4/12 showed Hg= 11.9 ~  Labs 6/13 showed Hg= 11.1, MCV=95  HEALTH MAINTENANCE:  she had the 2010 Flu shot 9/10... PNEUMOVAX in 2006 age age 87...   Past Surgical History  Procedure Laterality Date  . Pacemaker insertion      MDT  . Appendectomy    . Cholecystectomy    . Tonsillectomy    . Vaginal hysterectomy    . Cataract extraction, bilateral  end of June 2013  . Cataract extraction, bilateral  beginning of July 2013    Outpatient Encounter Prescriptions as of 06/21/2013  Medication Sig  . alendronate (FOSAMAX) 70 MG tablet Take 70 mg by mouth once a week. Take with a full glass of water on an empty stomach on Thursdays  . Alpha-Lipoic Acid 200 MG CAPS Take 1 capsule by mouth daily.   [name] aspirin 325 MG tablet Take 325 mg by mouth daily.    . Calcium  Carbonate-Vitamin D (CALTRATE 600+D) 600-400 MG-UNIT per tablet Take 1 tablet by mouth daily.    [name] co-enzyme Q-10 30 MG capsule Take 30 mg by mouth daily.    . Cyanocobalamin (B-12) 2500 MCG TABS Take 1 tablet by mouth daily.   [name] docusate sodium (COLACE) 100 MG capsule Take 200 mg by mouth at bedtime.   . fish oil-omega-3 fatty acids 1000 MG capsule Take 1 g by mouth daily.    [name] guaiFENesin (MUCINEX) 600 MG 12 hr tablet Take 600 mg by mouth 2 (two) times daily as needed for to loosen phlegm.   [name] levothyroxine (SYNTHROID, LEVOTHROID) 150 MCG tablet TAKE 1 TABLET BY MOUTH EVERY DAY BEFORE BREAKFAST  . losartan-hydrochlorothiazide (HYZAAR) 50-12.5 MG per tablet Take 1/2 tablet by mouth every morning  . Misc Natural Products (OSTEO BI-FLEX ADV DOUBLE ST) TABS Take 1 tablet by mouth 2 (two) times daily.  . polyethylene glycol (MIRALAX / GLYCOLAX) packet Take 17 g by mouth as needed.  . pregabalin (LYRICA) 75 MG capsule Take 75 mg by mouth at bedtime.   . Probiotic Product (PROBIOTIC FORMULA) CAPS Take 1 capsule by mouth daily.    . cholecalciferol (VITAMIN D) 1000 UNITS tablet Take 1,000 Units by mouth daily.    . simvastatin (ZOCOR) 40 MG tablet Take 40 mg by mouth at bedtime.  . [DISCONTINUED] feeding supplement, ENSURE COMPLETE, (ENSURE COMPLETE) LIQD Take 237 mLs by mouth daily.  . [DISCONTINUED] levofloxacin (LEVAQUIN) 500 MG tablet Take 1 tablet (500 mg total) by mouth daily.    Allergies  Allergen Reactions  . Penicillins Anaphylaxis    REACTION: swells up all over body, pt states she almost died  . Alprazolam      REACTION: eyes swell up  . Bactrim [Sulfamethoxazole-Trimethoprim]     Unable to remember the side effects  . Ceclor [Cefaclor]     Unsure of the side effects  . Cephalexin     Unsure of the side effects  . Clindamycin/Lincomycin     Upset stomach  . Hydrocodone     Unsure of reaction to med  . Noroxin [Norfloxacin]     Unsure of side effects  . Sulfonamide  Derivatives     REACTION: itching  . Tetracyclines & Related     Unsure of the reaction to this medication    Current Medications, Allergies, Past Medical History, Past Surgical History, Family History, and Social  History were reviewed in [facility] record.    Review of Systems         See HPI - all other systems neg except as noted... The patient complains of decreased hearing, dyspnea on exertion, and difficulty walking.  The patient denies anorexia, fever, weight loss, weight gain, vision loss, hoarseness, chest pain, syncope, peripheral edema, prolonged cough, headaches, hemoptysis, abdominal pain, melena, hematochezia, severe indigestion/heartburn, hematuria, incontinence, muscle weakness, suspicious skin lesions, transient blindness, depression, unusual weight change, abnormal bleeding, enlarged lymph nodes, and angioedema.    Objective:   Physical Exam      WD, WN, 80 y/o WF in NAD;  VS- reviewed. GENERAL:  Alert & oriented; pleasant & cooperative... HEENT:  [place]/AT, EOM-full, PERRLA, EACs-clear (she has bilat hearing aides), TMs-wnl, NOSE-clear, THROAT-clear & wnl. NECK:  Supple w/ fair ROM; no JVD; normal carotid impulses w/o bruits; no thyromegaly or nodules palpated; no lymphadenopathy. CHEST:  Clear to P & A; without wheezes/ rales/ or rhonchi heard; pacer in right upper shoulder area. HEART:  Regular gr 2/6 SEM S4 without rubs... ABDOMEN:  Soft & nontender; normal bowel sounds; no organomegaly or masses detected. EXT: without deformities, mild arthritic changes; +varicose veins/ +venous insuffic/ no edema. NEURO:  CN's intact; no focal neuro deficits x decr sensation in LE's c/w periph neuropathy.  DERM:  No lesions noted; no rash etc...  RADIOLOGY DATA:  Reviewed in the EPIC EMR & discussed w/ the patient...  LABORATORY DATA:  Reviewed in the EPIC EMR & discussed w/ the patient...   Assessment & Plan:    Hx of PNEUMONIA>  Hx RLL pneumonia-  resolved, underlying COPD, hx recurrent bronchitis...  HBP>  Controlled on Diovan/HCT=> changed to LosarHCT 50-12.5 start w/ 1/2 tab Qam...  CARDIAC>  Hx CWP, AoV dis, Pacer;  Stable now & doing well w/o symptoms;  She will continue f/u in [place] clinic...  LIPIDS>  On Simva40 + CoQ10;  FLP is at goals & continue same...  DM>  On diet alone;  Labs have beed good & A1c= 6.6.Marland KitchenMarland Kitchen  HYPOTHYROID>  Stable on the Synth125, continue same...  ORTHO>  She has DJD, LBP, leg pain> she continues f/u by Kayla Davis for shots prn, doesn't want pain meds, we reviewed her predicament...  Noct leg pain>  This seems like an additional prob & rec to try tonic water, mustard, & the homeopathic quinine prep that her son found for her to put under her tongue...  Other medical problems as noted.Marland KitchenMarland Kitchen

## 2013-06-21 NOTE — Patient Instructions (Signed)
Today we updated your med list in our EPIC system...     We decided to change the Simvastatin to a non-statin cholesterol medication- ZETIA 10mg  take one tab daily...  Remember to get a "PILL CUTTER" at the Pharm for your Hyzaar...  Today we did your follow up blood work...    We will contact you w/ the results when available...   Call for any questions or if we can be of service in any way.Marland KitchenMarland Kitchen

## 2013-06-22 ENCOUNTER — Other Ambulatory Visit: Payer: Self-pay | Admitting: Pulmonary Disease

## 2013-06-22 MED ORDER — CIPROFLOXACIN HCL 250 MG PO TABS: 250.0000 mg | ORAL_TABLET | Freq: Two times a day (BID) | ORAL | Status: DC

## 2013-06-22 MED ORDER — LEVOTHYROXINE SODIUM 125 MCG PO TABS: 125.0000 ug | ORAL_TABLET | Freq: Every day | ORAL | Status: DC

## 2013-06-23 DIAGNOSIS — M5137 Other intervertebral disc degeneration, lumbosacral region: Secondary | ICD-10-CM | POA: Diagnosis not present

## 2013-06-23 DIAGNOSIS — R269 Unspecified abnormalities of gait and mobility: Secondary | ICD-10-CM | POA: Diagnosis not present

## 2013-06-23 DIAGNOSIS — I10 Essential (primary) hypertension: Secondary | ICD-10-CM | POA: Diagnosis not present

## 2013-06-23 DIAGNOSIS — J159 Unspecified bacterial pneumonia: Secondary | ICD-10-CM | POA: Diagnosis not present

## 2013-06-23 DIAGNOSIS — N390 Urinary tract infection, site not specified: Secondary | ICD-10-CM | POA: Diagnosis not present

## 2013-06-23 DIAGNOSIS — F411 Generalized anxiety disorder: Secondary | ICD-10-CM | POA: Diagnosis not present

## 2013-06-24 LAB — URINE CULTURE: Colony Count: >=100000

## 2013-06-29 DIAGNOSIS — F411 Generalized anxiety disorder: Secondary | ICD-10-CM | POA: Diagnosis not present

## 2013-06-29 DIAGNOSIS — M5137 Other intervertebral disc degeneration, lumbosacral region: Secondary | ICD-10-CM | POA: Diagnosis not present

## 2013-06-29 DIAGNOSIS — N390 Urinary tract infection, site not specified: Secondary | ICD-10-CM | POA: Diagnosis not present

## 2013-06-29 DIAGNOSIS — J159 Unspecified bacterial pneumonia: Secondary | ICD-10-CM | POA: Diagnosis not present

## 2013-06-29 DIAGNOSIS — R269 Unspecified abnormalities of gait and mobility: Secondary | ICD-10-CM | POA: Diagnosis not present

## 2013-06-29 DIAGNOSIS — I10 Essential (primary) hypertension: Secondary | ICD-10-CM | POA: Diagnosis not present

## 2013-06-30 DIAGNOSIS — N390 Urinary tract infection, site not specified: Secondary | ICD-10-CM | POA: Diagnosis not present

## 2013-06-30 DIAGNOSIS — R269 Unspecified abnormalities of gait and mobility: Secondary | ICD-10-CM | POA: Diagnosis not present

## 2013-06-30 DIAGNOSIS — M5137 Other intervertebral disc degeneration, lumbosacral region: Secondary | ICD-10-CM | POA: Diagnosis not present

## 2013-06-30 DIAGNOSIS — J159 Unspecified bacterial pneumonia: Secondary | ICD-10-CM | POA: Diagnosis not present

## 2013-06-30 DIAGNOSIS — F411 Generalized anxiety disorder: Secondary | ICD-10-CM | POA: Diagnosis not present

## 2013-06-30 DIAGNOSIS — I10 Essential (primary) hypertension: Secondary | ICD-10-CM | POA: Diagnosis not present

## 2013-07-19 ENCOUNTER — Other Ambulatory Visit: Payer: Self-pay | Admitting: Pulmonary Disease

## 2013-07-19 MED ORDER — NITROFURANTOIN MONOHYD MACRO 100 MG PO CAPS: 100.0000 mg | ORAL_CAPSULE | Freq: Two times a day (BID) | ORAL | Status: DC

## 2013-08-08 ENCOUNTER — Ambulatory Visit: Payer: MEDICARE | Admitting: Internal Medicine

## 2013-08-12 ENCOUNTER — Other Ambulatory Visit (INDEPENDENT_AMBULATORY_CARE_PROVIDER_SITE_OTHER): Payer: MEDICARE

## 2013-08-12 ENCOUNTER — Encounter: Payer: Self-pay | Admitting: Internal Medicine

## 2013-08-12 ENCOUNTER — Ambulatory Visit (INDEPENDENT_AMBULATORY_CARE_PROVIDER_SITE_OTHER): Payer: MEDICARE | Admitting: Internal Medicine

## 2013-08-12 VITALS — BP 140/70 | HR 76 | Temp 98.1°F | Resp 16 | Wt 150.0 lb

## 2013-08-12 DIAGNOSIS — R5383 Other fatigue: Secondary | ICD-10-CM | POA: Diagnosis not present

## 2013-08-12 DIAGNOSIS — D649 Anemia, unspecified: Secondary | ICD-10-CM

## 2013-08-12 DIAGNOSIS — R202 Paresthesia of skin: Secondary | ICD-10-CM

## 2013-08-12 DIAGNOSIS — G609 Hereditary and idiopathic neuropathy, unspecified: Secondary | ICD-10-CM

## 2013-08-12 DIAGNOSIS — R5381 Other malaise: Secondary | ICD-10-CM | POA: Diagnosis not present

## 2013-08-12 DIAGNOSIS — E039 Hypothyroidism, unspecified: Secondary | ICD-10-CM

## 2013-08-12 DIAGNOSIS — R7309 Other abnormal glucose: Secondary | ICD-10-CM

## 2013-08-12 DIAGNOSIS — N390 Urinary tract infection, site not specified: Secondary | ICD-10-CM

## 2013-08-12 DIAGNOSIS — M810 Age-related osteoporosis without current pathological fracture: Secondary | ICD-10-CM

## 2013-08-12 DIAGNOSIS — R531 Weakness: Secondary | ICD-10-CM

## 2013-08-12 DIAGNOSIS — I10 Essential (primary) hypertension: Secondary | ICD-10-CM

## 2013-08-12 DIAGNOSIS — R209 Unspecified disturbances of skin sensation: Secondary | ICD-10-CM

## 2013-08-12 LAB — CBC WITH DIFFERENTIAL/PLATELET
Basophils Absolute: 0 10*3/uL (ref 0.0–0.1)
Basophils Relative: 0.9 % (ref 0.0–3.0)
EOS PCT: 7.4 % — AB (ref 0.0–5.0)
Eosinophils Absolute: 0.4 10*3/uL (ref 0.0–0.7)
HEMATOCRIT: 34.3 % — AB (ref 36.0–46.0)
Hemoglobin: 11.7 g/dL — ABNORMAL LOW (ref 12.0–15.0)
LYMPHS ABS: 1.8 10*3/uL (ref 0.7–4.0)
Lymphocytes Relative: 33.4 % (ref 12.0–46.0)
MCHC: 34 g/dL (ref 30.0–36.0)
MCV: 92.1 fl (ref 78.0–100.0)
Monocytes Absolute: 0.5 10*3/uL (ref 0.1–1.0)
Monocytes Relative: 9.5 % (ref 3.0–12.0)
Neutro Abs: 2.6 10*3/uL (ref 1.4–7.7)
Neutrophils Relative %: 48.8 % (ref 43.0–77.0)
Platelets: 263 10*3/uL (ref 150.0–400.0)
RBC: 3.73 Mil/uL — AB (ref 3.87–5.11)
RDW: 14.2 % (ref 11.5–14.6)
WBC: 5.3 10*3/uL (ref 4.5–10.5)

## 2013-08-12 LAB — URINALYSIS
Bilirubin Urine: NEGATIVE
Hgb urine dipstick: NEGATIVE
Ketones, ur: NEGATIVE
LEUKOCYTES UA: NEGATIVE
Nitrite: NEGATIVE
SPECIFIC GRAVITY, URINE: 1.015 (ref 1.000–1.030)
TOTAL PROTEIN, URINE-UPE24: NEGATIVE
Urine Glucose: NEGATIVE
Urobilinogen, UA: 0.2 (ref 0.0–1.0)
pH: 6 (ref 5.0–8.0)

## 2013-08-12 LAB — T4, FREE: Free T4: 0.8 ng/dL (ref 0.60–1.60)

## 2013-08-12 LAB — HEPATIC FUNCTION PANEL
ALT: 16 U/L (ref 0–35)
AST: 21 U/L (ref 0–37)
Albumin: 3.8 g/dL (ref 3.5–5.2)
Alkaline Phosphatase: 71 U/L (ref 39–117)
BILIRUBIN DIRECT: 0 mg/dL (ref 0.0–0.3)
Total Bilirubin: 0.6 mg/dL (ref 0.3–1.2)
Total Protein: 6.6 g/dL (ref 6.0–8.3)

## 2013-08-12 LAB — BASIC METABOLIC PANEL
BUN: 24 mg/dL — AB (ref 6–23)
CO2: 29 mEq/L (ref 19–32)
Calcium: 9.5 mg/dL (ref 8.4–10.5)
Chloride: 99 mEq/L (ref 96–112)
Creatinine, Ser: 0.7 mg/dL (ref 0.4–1.2)
GFR: 79.85 mL/min (ref >60.00)
Glucose, Bld: 136 mg/dL — ABNORMAL HIGH (ref 70–99)
POTASSIUM: 4.8 meq/L (ref 3.5–5.1)
Sodium: 135 mEq/L (ref 135–145)

## 2013-08-12 LAB — IBC PANEL
Iron: 81 ug/dL (ref 42–145)
SATURATION RATIOS: 22.3 % (ref 20.0–50.0)
Transferrin: 259.8 mg/dL (ref 212.0–360.0)

## 2013-08-12 LAB — SEDIMENTATION RATE: Sed Rate: 30 mm/hr — ABNORMAL HIGH (ref 0–22)

## 2013-08-12 LAB — VITAMIN B12: Vitamin B-12: 1264 pg/mL — ABNORMAL HIGH (ref 211–911)

## 2013-08-12 LAB — HEMOGLOBIN A1C: Hgb A1c MFr Bld: 6.2 % (ref 4.6–6.5)

## 2013-08-12 NOTE — Assessment & Plan Note (Signed)
Continue with current prescription therapy as reflected on the Med list.  

## 2013-08-12 NOTE — Assessment & Plan Note (Signed)
Doing ok.

## 2013-08-12 NOTE — Assessment & Plan Note (Signed)
Labs

## 2013-08-12 NOTE — Assessment & Plan Note (Signed)
Labs  Continue with current prescription therapy as reflected on the Med list.  

## 2013-08-12 NOTE — Assessment & Plan Note (Signed)
recurrent UTI w/MRSA 06/21/13 Cx  UA

## 2013-08-12 NOTE — Progress Notes (Signed)
Pre visit review using our clinic review tool, if applicable. No additional management support is needed unless otherwise documented below in the visit note. 

## 2013-08-12 NOTE — Progress Notes (Signed)
   Subjective:    Patient ID: Kayla Davis, female    DOB: [birth year], 80 y.o.   MRN: [id number]  HPI  New pt  Switching from Dr Lenna Gilford The patient presents for a follow-up of  chronic hypertension, TIA, LBP, gait disorder, anemia, spinal stenosis w/gait disorder, chronic dyslipidemia.  C/o recurrent UTI w/MRSA 06/21/13 Cx  S/p pacemaker  BP Readings from Last 3 Encounters:  08/12/13 140/70  06/21/13 134/60  05/10/13 126/68   Wt Readings from Last 3 Encounters:  08/12/13 150 lb (68.04 kg)  06/21/13 149 lb 6.4 oz (67.767 kg)  05/10/13 147 lb 8 oz (66.906 kg)     Review of Systems  Constitutional: Negative for chills, activity change, appetite change, fatigue and unexpected weight change.  HENT: Negative for congestion, mouth sores, rhinorrhea, sinus pressure, sore throat, tinnitus and voice change.   Eyes: Negative for visual disturbance.  Respiratory: Negative for cough and chest tightness.   Gastrointestinal: Negative for nausea and abdominal pain.  Genitourinary: Negative for frequency, difficulty urinating and vaginal pain.  Musculoskeletal: Positive for arthralgias, back pain and gait problem. Negative for neck stiffness.  Skin: Negative for pallor and rash.  Neurological: Negative for dizziness, tremors, weakness, numbness and headaches.  Psychiatric/Behavioral: Negative for suicidal ideas, confusion, sleep disturbance and dysphoric mood.       Objective:   Physical Exam  Constitutional: She appears well-developed. No distress.  HENT:  Head: Normocephalic.  Right Ear: External ear normal.  Left Ear: External ear normal.  Nose: Nose normal.  Mouth/Throat: Oropharynx is clear and moist.  Eyes: Conjunctivae are normal. Pupils are equal, round, and reactive to light. Right eye exhibits no discharge. Left eye exhibits no discharge.  Neck: Normal range of motion. Neck supple. No JVD present. No tracheal deviation present. No thyromegaly present.  Cardiovascular:  Normal rate, regular rhythm and normal heart sounds.   Pulmonary/Chest: No stridor. No respiratory distress. She has no wheezes.  Abdominal: Soft. Bowel sounds are normal. She exhibits no distension and no mass. There is no tenderness. There is no rebound and no guarding.  Musculoskeletal: She exhibits tenderness. She exhibits no edema.  LS is stiff, painful  Lymphadenopathy:    She has no cervical adenopathy.  Neurological: She displays normal reflexes. No cranial nerve deficit. She exhibits normal muscle tone. Coordination normal.  Cane  Skin: No rash noted. No erythema.  Psychiatric: She has a normal mood and affect. Her behavior is normal. Judgment and thought content normal.   Lab Results  Component Value Date   WBC 5.0 06/21/2013   HGB 11.3* 06/21/2013   HCT 33.3* 06/21/2013   PLT 197.0 06/21/2013   GLUCOSE 101* 06/21/2013   CHOL 170 09/14/2012   TRIG 83.0 09/14/2012   HDL 58.70 09/14/2012   LDLDIRECT 143.0 10/07/2007   LDLCALC 95 09/14/2012   ALT 17 01/13/2013   AST 32 01/13/2013   NA 141 06/21/2013   K 4.2 06/21/2013   CL 108 06/21/2013   CREATININE 0.7 06/21/2013   BUN 17 06/21/2013   CO2 28 06/21/2013   TSH 0.22* 06/21/2013   INR 1.04 02/11/2010   HGBA1C 6.6* 06/21/2013          Assessment & Plan:

## 2013-08-13 LAB — VITAMIN D 25 HYDROXY (VIT D DEFICIENCY, FRACTURES): Vit D, 25-Hydroxy: 46 ng/mL (ref 30–89)

## 2013-08-14 LAB — CULTURE, URINE COMPREHENSIVE

## 2013-08-16 ENCOUNTER — Encounter: Payer: Self-pay | Admitting: Internal Medicine

## 2013-08-22 DIAGNOSIS — D0439 Carcinoma in situ of skin of other parts of face: Secondary | ICD-10-CM | POA: Diagnosis not present

## 2013-08-22 DIAGNOSIS — D0430 Carcinoma in situ of skin of unspecified part of face: Secondary | ICD-10-CM | POA: Diagnosis not present

## 2013-08-29 ENCOUNTER — Encounter: Payer: Self-pay | Admitting: Nurse Practitioner

## 2013-08-29 ENCOUNTER — Ambulatory Visit (INDEPENDENT_AMBULATORY_CARE_PROVIDER_SITE_OTHER): Payer: MEDICARE | Admitting: Nurse Practitioner

## 2013-08-29 VITALS — BP 130/60 | HR 68 | Ht 64.0 in | Wt 148.4 lb

## 2013-08-29 DIAGNOSIS — I10 Essential (primary) hypertension: Secondary | ICD-10-CM

## 2013-08-29 DIAGNOSIS — Z950 Presence of cardiac pacemaker: Secondary | ICD-10-CM | POA: Diagnosis not present

## 2013-08-29 NOTE — Progress Notes (Signed)
Kayla Davis Date of Birth: 1922-05-10 Medical Record #[id number]  History of Present Illness: Kayla Davis is seen back today for a 6 month check. Seen for Dr. [name]. 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  Last seen here in November - was doing ok except for chronic venous insufficiency issues.   Comes back today. Here alone. Not taking her aspirin due to upcoming back injection. She feels like she is doing ok. No chest pain. Short of breath if she does too much. Still has some swelling - has trouble using the support stockings. Not dizzy. Did have a fall back in January in the setting of a UTI and pneumonia. Using her cane. Mostly limited by back pain. Had lab earlier this month which was reviewed. Now seeing Dr. Alain Marion for primary care in light of Dr. Jeannine Kitten retirement.   Current Outpatient Prescriptions  Medication Sig Dispense Refill  . cholecalciferol (VITAMIN D) 1000 UNITS tablet Take 1,000 Units by mouth daily.        . Cyanocobalamin (B-12) 2500 MCG TABS Take 1 tablet by mouth daily.       [name] ezetimibe (ZETIA) 10 MG tablet Take 1 tablet (10 mg total) by mouth daily.  90 tablet  3  . levothyroxine (SYNTHROID) 125 MCG tablet Take 1 tablet (125 mcg total) by mouth daily before breakfast.  30 tablet  5  . losartan-hydrochlorothiazide (HYZAAR) 50-12.5 MG per tablet Take 1/2 tablet by mouth every morning  45 tablet  3  . nitrofurantoin, macrocrystal-monohydrate, (MACROBID) 100 MG capsule Take 1 capsule (100 mg total) by mouth 2 (two) times daily.  20 capsule  0  . polyethylene glycol (MIRALAX / GLYCOLAX) packet Take 17 g by mouth as needed.      . pregabalin (LYRICA) 75 MG capsule Take 75 mg by mouth at bedtime.       [name] aspirin 325 MG tablet Take 325 mg by mouth daily.        . [DISCONTINUED] valsartan-hydrochlorothiazide (DIOVAN-HCT) 80-12.5 MG per tablet TAKE 1 TABLET BY MOUTH DAILY  90  tablet  3   No current facility-administered medications for this visit.    Allergies  Allergen Reactions  . Penicillins Anaphylaxis    REACTION: swells up all over body, pt states she almost died  . Alprazolam      REACTION: eyes swell up  . Bactrim [Sulfamethoxazole-Trimethoprim]     Unable to remember the side effects  . Ceclor [Cefaclor]     Unsure of the side effects  . Cephalexin     Unsure of the side effects  . Clindamycin/Lincomycin     Upset stomach  . Hydrocodone     Unsure of reaction to med  . Noroxin [Norfloxacin]     Unsure of side effects  . Sulfonamide Derivatives     REACTION: itching  . Tetracyclines & Related     Unsure of the reaction to this medication    Past Medical History  Diagnosis Date  . Hypertension   . Hyperlipidemia   . Aortic valve disorders   . Unspecified hereditary and idiopathic peripheral neuropathy   . Other abnormal glucose   . Unspecified hearing loss   . Unspecified chronic bronchitis   . Unspecified hypothyroidism   . Unspecified gastritis and gastroduodenitis without mention of hemorrhage   . Diverticulosis of colon (without mention of hemorrhage)   . Irritable bowel  syndrome   . Osteoarthrosis, unspecified whether generalized or localized, unspecified site   . Backache, unspecified   . Pain in limb   . Osteoporosis, unspecified   . Herpes zoster with other nervous system complications(053.19)   . Anxiety state, unspecified   . Anemia, unspecified   . Bradycardia     s/p PPM    Past Surgical History  Procedure Laterality Date  . Pacemaker insertion      MDT  . Appendectomy    . Cholecystectomy    . Tonsillectomy    . Vaginal hysterectomy    . Cataract extraction, bilateral  end of June 2013  . Cataract extraction, bilateral  beginning of July 2013    History  Smoking status  . Never Smoker   Smokeless tobacco  . Never Used    History  Alcohol Use No    Family History  Problem Relation Age of  Onset  . Heart disease Father   . Cancer Paternal Grandmother   . Diabetes Father   . Heart disease Mother     Review of Systems: The review of systems is per the HPI.  All other systems were reviewed and are negative.  Physical Exam: BP 130/60  Pulse 68  Ht 5\' 4"  (1.626 m)  Wt 148 lb 6.4 oz (67.314 kg)  BMI 25.46 kg/m2  SpO2 97% Patient is a very pleasant elderly female who is in no acute distress. Little hard of hearing. Slow with walking. Using a cane. Skin is warm and dry. Color is normal.  HEENT is unremarkable. Normocephalic/atraumatic. PERRL. Sclera are nonicteric. Neck is supple. No masses. No JVD. Lungs are clear. Cardiac exam shows a regular rate and rhythm. Abdomen is soft. Extremities are without edema. Gait and ROM are intact. No gross neurologic deficits noted.  LABORATORY DATA:  Lab Results  Component Value Date   WBC 5.3 08/12/2013   HGB 11.7* 08/12/2013   HCT 34.3* 08/12/2013   PLT 263.0 08/12/2013   GLUCOSE 136* 08/12/2013   CHOL 170 09/14/2012   TRIG 83.0 09/14/2012   HDL 58.70 09/14/2012   LDLDIRECT 143.0 10/07/2007   LDLCALC 95 09/14/2012   ALT 16 08/12/2013   AST 21 08/12/2013   NA 135 08/12/2013   K 4.8 08/12/2013   CL 99 08/12/2013   CREATININE 0.7 08/12/2013   BUN 24* 08/12/2013   CO2 29 08/12/2013   TSH 0.22* 06/21/2013   INR 1.04 02/11/2010   HGBA1C 6.2 08/12/2013     Assessment / Plan:  1. HTN - BP ok. No change in current regimen.   2. PPM - followed by Dr. [name]  3. Chronic venous insufficiency - encouraged her to wear support stockings.   See back in November by Dr. [name]. No change in current regimen.   Patient is agreeable to this plan and will call if any problems develop in the interim.   Burtis Junes, RN, [facility] [address] [place],   [id number] [phone]

## 2013-08-29 NOTE — Patient Instructions (Addendum)
Stay on your current medicines  See Dr. [name] back as planned in November  Stay as active as possible  Call the [place] office at [phone] if you have any questions, problems or concerns.

## 2013-08-31 ENCOUNTER — Encounter: Payer: Self-pay | Admitting: Internal Medicine

## 2013-08-31 ENCOUNTER — Ambulatory Visit (INDEPENDENT_AMBULATORY_CARE_PROVIDER_SITE_OTHER): Payer: MEDICARE | Admitting: *Deleted

## 2013-08-31 ENCOUNTER — Telehealth: Payer: Self-pay | Admitting: Cardiology

## 2013-08-31 DIAGNOSIS — M545 Low back pain, unspecified: Secondary | ICD-10-CM | POA: Diagnosis not present

## 2013-08-31 DIAGNOSIS — Z950 Presence of cardiac pacemaker: Secondary | ICD-10-CM

## 2013-08-31 DIAGNOSIS — I495 Sick sinus syndrome: Secondary | ICD-10-CM | POA: Diagnosis not present

## 2013-08-31 NOTE — Telephone Encounter (Signed)
Spoke with pt and reminded pt of remote transmission that is due today. Pt verbalized understanding.   

## 2013-09-01 LAB — MDC_IDC_ENUM_SESS_TYPE_REMOTE
Battery Impedance: 205 Ohm
Battery Voltage: 2.78 V
Brady Statistic AP VS Percent: 0 %
Brady Statistic AS VS Percent: 0 %
Lead Channel Impedance Value: 539 Ohm
Lead Channel Sensing Intrinsic Amplitude: 0.625 mV
Lead Channel Setting Pacing Amplitude: 2 V
Lead Channel Setting Pacing Pulse Width: 0.4 ms
MDC IDC MSMT BATTERY REMAINING LONGEVITY: 112 mo
MDC IDC MSMT LEADCHNL RA IMPEDANCE VALUE: 472 Ohm
MDC IDC MSMT LEADCHNL RA PACING THRESHOLD AMPLITUDE: 0.5 V
MDC IDC MSMT LEADCHNL RA PACING THRESHOLD PULSEWIDTH: 0.4 ms
MDC IDC MSMT LEADCHNL RA SENSING INTR AMPL: 1.4 mV
MDC IDC SESS DTM: 20150521002500
MDC IDC SET LEADCHNL RV PACING AMPLITUDE: 2.5 V
MDC IDC SET LEADCHNL RV SENSING SENSITIVITY: 5.6 mV
MDC IDC STAT BRADY AP VP PERCENT: 17 %
MDC IDC STAT BRADY AS VP PERCENT: 83 %

## 2013-09-01 NOTE — Progress Notes (Signed)
Remote pacemaker transmission.   

## 2013-09-23 ENCOUNTER — Encounter: Payer: Self-pay | Admitting: Cardiology

## 2013-09-26 ENCOUNTER — Telehealth: Payer: Self-pay | Admitting: Internal Medicine

## 2013-09-26 NOTE — Telephone Encounter (Signed)
New Message:  Pt is calling to see if her transmission last month came through and if all was ok.

## 2013-09-26 NOTE — Telephone Encounter (Signed)
Transmission received, patient aware. 

## 2013-10-19 ENCOUNTER — Telehealth: Payer: Self-pay | Admitting: Pulmonary Disease

## 2013-10-19 NOTE — Telephone Encounter (Signed)
Form received and placed on SN cart to be signed and i will fax it back once completed.

## 2013-10-19 NOTE — Telephone Encounter (Signed)
Will check on the triage fax to see if this has come through and have SN sign this form.

## 2013-10-20 NOTE — Telephone Encounter (Signed)
Order has been signed by [facility] and faxed back and placed in scan folder.  Nothing further is needed.

## 2013-10-21 ENCOUNTER — Other Ambulatory Visit: Payer: Self-pay | Admitting: Pulmonary Disease

## 2013-10-28 ENCOUNTER — Other Ambulatory Visit: Payer: Self-pay | Admitting: Pulmonary Disease

## 2013-10-28 MED ORDER — LEVOTHYROXINE SODIUM 125 MCG PO TABS: 125.0000 ug | ORAL_TABLET | Freq: Every day | ORAL | Status: DC

## 2013-11-14 DIAGNOSIS — H43819 Vitreous degeneration, unspecified eye: Secondary | ICD-10-CM | POA: Diagnosis not present

## 2013-11-14 DIAGNOSIS — H01009 Unspecified blepharitis unspecified eye, unspecified eyelid: Secondary | ICD-10-CM | POA: Diagnosis not present

## 2013-11-14 DIAGNOSIS — H04129 Dry eye syndrome of unspecified lacrimal gland: Secondary | ICD-10-CM | POA: Diagnosis not present

## 2013-11-14 DIAGNOSIS — E119 Type 2 diabetes mellitus without complications: Secondary | ICD-10-CM | POA: Diagnosis not present

## 2013-11-15 ENCOUNTER — Telehealth: Payer: Self-pay | Admitting: Internal Medicine

## 2013-11-15 ENCOUNTER — Ambulatory Visit (INDEPENDENT_AMBULATORY_CARE_PROVIDER_SITE_OTHER): Payer: MEDICARE | Admitting: Internal Medicine

## 2013-11-15 ENCOUNTER — Encounter: Payer: Self-pay | Admitting: Internal Medicine

## 2013-11-15 VITALS — BP 130/70 | HR 68 | Temp 98.5°F | Resp 16 | Wt 149.0 lb

## 2013-11-15 DIAGNOSIS — E039 Hypothyroidism, unspecified: Secondary | ICD-10-CM

## 2013-11-15 DIAGNOSIS — G609 Hereditary and idiopathic neuropathy, unspecified: Secondary | ICD-10-CM | POA: Diagnosis not present

## 2013-11-15 DIAGNOSIS — I10 Essential (primary) hypertension: Secondary | ICD-10-CM

## 2013-11-15 DIAGNOSIS — D649 Anemia, unspecified: Secondary | ICD-10-CM

## 2013-11-15 DIAGNOSIS — Z0000 Encounter for general adult medical examination without abnormal findings: Secondary | ICD-10-CM

## 2013-11-15 DIAGNOSIS — E785 Hyperlipidemia, unspecified: Secondary | ICD-10-CM

## 2013-11-15 MED ORDER — PREGABALIN 75 MG PO CAPS: 75.0000 mg | ORAL_CAPSULE | Freq: Every day | ORAL | Status: DC

## 2013-11-15 MED ORDER — LOSARTAN POTASSIUM-HCTZ 50-12.5 MG PO TABS: ORAL_TABLET | ORAL | Status: DC

## 2013-11-15 NOTE — Assessment & Plan Note (Signed)
Continue with current prescription therapy as reflected on the Med list.  

## 2013-11-15 NOTE — Telephone Encounter (Signed)
Patient had an appointment today. She wants to make sure we send her simvastatin (ZOCOR) 40 MG to her mail order pharmacy. Please advise.

## 2013-11-15 NOTE — Progress Notes (Signed)
Pre visit review using our clinic review tool, if applicable. No additional management support is needed unless otherwise documented below in the visit note. 

## 2013-11-15 NOTE — Assessment & Plan Note (Signed)
Labs

## 2013-11-15 NOTE — Assessment & Plan Note (Signed)
Try to reduce Zocor (pains)

## 2013-11-15 NOTE — Progress Notes (Signed)
   Subjective:    HPI     The patient presents for a follow-up of  chronic hypertension, TIA, LBP, gait disorder, anemia, spinal stenosis w/gait disorder, chronic dyslipidemia.  F/u recurrent UTI w/MRSA 06/21/13 Cx  S/p pacemaker  BP Readings from Last 3 Encounters:  11/15/13 130/70  08/29/13 130/60  08/12/13 140/70   Wt Readings from Last 3 Encounters:  11/15/13 149 lb (67.586 kg)  08/29/13 148 lb 6.4 oz (67.314 kg)  08/12/13 150 lb (68.04 kg)     Review of Systems  Constitutional: Negative for chills, activity change, appetite change, fatigue and unexpected weight change.  HENT: Negative for congestion, mouth sores, rhinorrhea, sinus pressure, sore throat, tinnitus and voice change.   Eyes: Negative for visual disturbance.  Respiratory: Negative for cough and chest tightness.   Gastrointestinal: Negative for nausea and abdominal pain.  Genitourinary: Negative for frequency, difficulty urinating and vaginal pain.  Musculoskeletal: Positive for arthralgias, back pain and gait problem. Negative for neck stiffness.  Skin: Negative for pallor and rash.  Neurological: Negative for dizziness, tremors, weakness, numbness and headaches.  Psychiatric/Behavioral: Negative for suicidal ideas, confusion, sleep disturbance and dysphoric mood.       Objective:   Physical Exam  Constitutional: She appears well-developed. No distress.  HENT:  Head: Normocephalic.  Right Ear: External ear normal.  Left Ear: External ear normal.  Nose: Nose normal.  Mouth/Throat: Oropharynx is clear and moist.  Eyes: Conjunctivae are normal. Pupils are equal, round, and reactive to light. Right eye exhibits no discharge. Left eye exhibits no discharge.  Neck: Normal range of motion. Neck supple. No JVD present. No tracheal deviation present. No thyromegaly present.  Cardiovascular: Normal rate, regular rhythm and normal heart sounds.   Pulmonary/Chest: No stridor. No respiratory distress. She has  no wheezes.  Abdominal: Soft. Bowel sounds are normal. She exhibits no distension and no mass. There is no tenderness. There is no rebound and no guarding.  Musculoskeletal: She exhibits tenderness. She exhibits no edema.  LS is stiff, painful  Lymphadenopathy:    She has no cervical adenopathy.  Neurological: She displays normal reflexes. No cranial nerve deficit. She exhibits normal muscle tone. Coordination normal.  Cane  Skin: No rash noted. No erythema.  Psychiatric: She has a normal mood and affect. Her behavior is normal. Judgment and thought content normal.   Lab Results  Component Value Date   WBC 5.3 08/12/2013   HGB 11.7* 08/12/2013   HCT 34.3* 08/12/2013   PLT 263.0 08/12/2013   GLUCOSE 136* 08/12/2013   CHOL 170 09/14/2012   TRIG 83.0 09/14/2012   HDL 58.70 09/14/2012   LDLDIRECT 143.0 10/07/2007   LDLCALC 95 09/14/2012   ALT 16 08/12/2013   AST 21 08/12/2013   NA 135 08/12/2013   K 4.8 08/12/2013   CL 99 08/12/2013   CREATININE 0.7 08/12/2013   BUN 24* 08/12/2013   CO2 29 08/12/2013   TSH 0.22* 06/21/2013   INR 1.04 02/11/2010   HGBA1C 6.2 08/12/2013          Assessment & Plan:

## 2013-11-16 ENCOUNTER — Telehealth: Payer: Self-pay | Admitting: Internal Medicine

## 2013-11-16 ENCOUNTER — Other Ambulatory Visit: Payer: Self-pay | Admitting: Internal Medicine

## 2013-11-16 ENCOUNTER — Other Ambulatory Visit (INDEPENDENT_AMBULATORY_CARE_PROVIDER_SITE_OTHER): Payer: MEDICARE

## 2013-11-16 DIAGNOSIS — E039 Hypothyroidism, unspecified: Secondary | ICD-10-CM

## 2013-11-16 DIAGNOSIS — G609 Hereditary and idiopathic neuropathy, unspecified: Secondary | ICD-10-CM | POA: Diagnosis not present

## 2013-11-16 DIAGNOSIS — Z0000 Encounter for general adult medical examination without abnormal findings: Secondary | ICD-10-CM

## 2013-11-16 DIAGNOSIS — I10 Essential (primary) hypertension: Secondary | ICD-10-CM

## 2013-11-16 DIAGNOSIS — D649 Anemia, unspecified: Secondary | ICD-10-CM

## 2013-11-16 DIAGNOSIS — N390 Urinary tract infection, site not specified: Secondary | ICD-10-CM | POA: Diagnosis not present

## 2013-11-16 DIAGNOSIS — E785 Hyperlipidemia, unspecified: Secondary | ICD-10-CM | POA: Diagnosis not present

## 2013-11-16 LAB — URINALYSIS, ROUTINE W REFLEX MICROSCOPIC
BILIRUBIN URINE: NEGATIVE
KETONES UR: NEGATIVE
Nitrite: POSITIVE — AB
PH: 7.5 (ref 5.0–8.0)
SPECIFIC GRAVITY, URINE: 1.01 (ref 1.000–1.030)
Total Protein, Urine: NEGATIVE
UROBILINOGEN UA: 0.2 (ref 0.0–1.0)
Urine Glucose: NEGATIVE

## 2013-11-16 LAB — BASIC METABOLIC PANEL
BUN: 16 mg/dL (ref 6–23)
CHLORIDE: 101 meq/L (ref 96–112)
CO2: 28 mEq/L (ref 19–32)
Calcium: 9.4 mg/dL (ref 8.4–10.5)
Creatinine, Ser: 0.7 mg/dL (ref 0.4–1.2)
GFR: 81.08 mL/min (ref >60.00)
GLUCOSE: 101 mg/dL — AB (ref 70–99)
POTASSIUM: 4.6 meq/L (ref 3.5–5.1)
SODIUM: 136 meq/L (ref 135–145)

## 2013-11-16 LAB — CBC WITH DIFFERENTIAL/PLATELET
Basophils Absolute: 0 10*3/uL (ref 0.0–0.1)
Basophils Relative: 0.8 % (ref 0.0–3.0)
EOS PCT: 7.6 % — AB (ref 0.0–5.0)
Eosinophils Absolute: 0.4 10*3/uL (ref 0.0–0.7)
HEMATOCRIT: 35.2 % — AB (ref 36.0–46.0)
HEMOGLOBIN: 11.6 g/dL — AB (ref 12.0–15.0)
LYMPHS ABS: 2.5 10*3/uL (ref 0.7–4.0)
Lymphocytes Relative: 42.2 % (ref 12.0–46.0)
MCHC: 33 g/dL (ref 30.0–36.0)
MCV: 94.4 fl (ref 78.0–100.0)
Monocytes Absolute: 0.5 10*3/uL (ref 0.1–1.0)
Monocytes Relative: 8.3 % (ref 3.0–12.0)
NEUTROS ABS: 2.4 10*3/uL (ref 1.4–7.7)
Neutrophils Relative %: 41.1 % — ABNORMAL LOW (ref 43.0–77.0)
Platelets: 225 10*3/uL (ref 150.0–400.0)
RBC: 3.73 Mil/uL — ABNORMAL LOW (ref 3.87–5.11)
RDW: 13.8 % (ref 11.5–15.5)
WBC: 5.9 10*3/uL (ref 4.0–10.5)

## 2013-11-16 LAB — LIPID PANEL
CHOL/HDL RATIO: 3
CHOLESTEROL: 164 mg/dL (ref 0–200)
HDL: 58.6 mg/dL (ref >39.00)
LDL CALC: 88 mg/dL (ref 0–99)
NonHDL: 105.4
Triglycerides: 85 mg/dL (ref 0.0–149.0)
VLDL: 17 mg/dL (ref 0.0–40.0)

## 2013-11-16 LAB — HEPATIC FUNCTION PANEL
ALK PHOS: 77 U/L (ref 39–117)
ALT: 17 U/L (ref 0–35)
AST: 20 U/L (ref 0–37)
Albumin: 3.8 g/dL (ref 3.5–5.2)
Bilirubin, Direct: 0.1 mg/dL (ref 0.0–0.3)
Total Bilirubin: 0.8 mg/dL (ref 0.2–1.2)
Total Protein: 6.6 g/dL (ref 6.0–8.3)

## 2013-11-16 LAB — TSH: TSH: 2.11 u[IU]/mL (ref 0.35–4.50)

## 2013-11-16 MED ORDER — SIMVASTATIN 40 MG PO TABS: 40.0000 mg | ORAL_TABLET | Freq: Every day | ORAL | Status: DC

## 2013-11-16 NOTE — Telephone Encounter (Signed)
Relevant patient education mailed to patient.  

## 2013-11-16 NOTE — Telephone Encounter (Signed)
Done. Called pt- phone wouldn't ring/unable to leave message/no other numbers listed.

## 2013-11-17 LAB — DRUG SCREEN, URINE
AMPHETAMINE SCRN UR: NEGATIVE
BARBITURATE QUANT UR: NEGATIVE
Benzodiazepines.: NEGATIVE
Cocaine Metabolites: NEGATIVE
Creatinine,U: 47.39 mg/dL
MARIJUANA METABOLITE: NEGATIVE
METHADONE: NEGATIVE
OPIATES: NEGATIVE
Phencyclidine (PCP): NEGATIVE
Propoxyphene: NEGATIVE

## 2013-11-18 ENCOUNTER — Emergency Department (HOSPITAL_COMMUNITY): Payer: MEDICARE

## 2013-11-18 ENCOUNTER — Encounter (HOSPITAL_COMMUNITY): Payer: Self-pay | Admitting: Emergency Medicine

## 2013-11-18 ENCOUNTER — Emergency Department (HOSPITAL_COMMUNITY)
Admission: EM | Admit: 2013-11-18 | Discharge: 2013-11-18 | Disposition: A | Discharge status: Home / Self Care | Payer: MEDICARE | Attending: Emergency Medicine | Admitting: Emergency Medicine

## 2013-11-18 DIAGNOSIS — Z8619 Personal history of other infectious and parasitic diseases: Secondary | ICD-10-CM | POA: Diagnosis not present

## 2013-11-18 DIAGNOSIS — Z862 Personal history of diseases of the blood and blood-forming organs and certain disorders involving the immune mechanism: Secondary | ICD-10-CM | POA: Insufficient documentation

## 2013-11-18 DIAGNOSIS — G608 Other hereditary and idiopathic neuropathies: Secondary | ICD-10-CM | POA: Diagnosis not present

## 2013-11-18 DIAGNOSIS — R42 Dizziness and giddiness: Secondary | ICD-10-CM | POA: Diagnosis not present

## 2013-11-18 DIAGNOSIS — H9190 Unspecified hearing loss, unspecified ear: Secondary | ICD-10-CM | POA: Diagnosis not present

## 2013-11-18 DIAGNOSIS — Z7982 Long term (current) use of aspirin: Secondary | ICD-10-CM | POA: Insufficient documentation

## 2013-11-18 DIAGNOSIS — E039 Hypothyroidism, unspecified: Secondary | ICD-10-CM | POA: Diagnosis not present

## 2013-11-18 DIAGNOSIS — N390 Urinary tract infection, site not specified: Secondary | ICD-10-CM

## 2013-11-18 DIAGNOSIS — Z8659 Personal history of other mental and behavioral disorders: Secondary | ICD-10-CM | POA: Diagnosis not present

## 2013-11-18 DIAGNOSIS — M1990 Unspecified osteoarthritis, unspecified site: Secondary | ICD-10-CM | POA: Insufficient documentation

## 2013-11-18 DIAGNOSIS — Z8709 Personal history of other diseases of the respiratory system: Secondary | ICD-10-CM | POA: Diagnosis not present

## 2013-11-18 DIAGNOSIS — I10 Essential (primary) hypertension: Secondary | ICD-10-CM | POA: Insufficient documentation

## 2013-11-18 DIAGNOSIS — Z79899 Other long term (current) drug therapy: Secondary | ICD-10-CM | POA: Insufficient documentation

## 2013-11-18 DIAGNOSIS — Z8719 Personal history of other diseases of the digestive system: Secondary | ICD-10-CM | POA: Insufficient documentation

## 2013-11-18 DIAGNOSIS — E785 Hyperlipidemia, unspecified: Secondary | ICD-10-CM | POA: Insufficient documentation

## 2013-11-18 DIAGNOSIS — Z880 Allergy status to penicillin: Secondary | ICD-10-CM | POA: Insufficient documentation

## 2013-11-18 DIAGNOSIS — R51 Headache: Secondary | ICD-10-CM | POA: Diagnosis not present

## 2013-11-18 DIAGNOSIS — H538 Other visual disturbances: Secondary | ICD-10-CM | POA: Diagnosis not present

## 2013-11-18 DIAGNOSIS — J984 Other disorders of lung: Secondary | ICD-10-CM | POA: Diagnosis not present

## 2013-11-18 LAB — URINE MICROSCOPIC-ADD ON

## 2013-11-18 LAB — BASIC METABOLIC PANEL
ANION GAP: 11 (ref 5–15)
BUN: 22 mg/dL (ref 6–23)
CALCIUM: 9.2 mg/dL (ref 8.4–10.5)
CO2: 26 mEq/L (ref 19–32)
Chloride: 100 mEq/L (ref 96–112)
Creatinine, Ser: 0.69 mg/dL (ref 0.50–1.10)
GFR calc non Af Amer: 75 mL/min — ABNORMAL LOW (ref >90)
GFR, EST AFRICAN AMERICAN: 87 mL/min — AB (ref >90)
Glucose, Bld: 162 mg/dL — ABNORMAL HIGH (ref 70–99)
Potassium: 3.8 mEq/L (ref 3.7–5.3)
Sodium: 137 mEq/L (ref 137–147)

## 2013-11-18 LAB — URINE CULTURE: Colony Count: >=100000

## 2013-11-18 LAB — CBC
HCT: 34.6 % — ABNORMAL LOW (ref 36.0–46.0)
Hemoglobin: 11.5 g/dL — ABNORMAL LOW (ref 12.0–15.0)
MCH: 31.4 pg (ref 26.0–34.0)
MCHC: 33.2 g/dL (ref 30.0–36.0)
MCV: 94.5 fL (ref 78.0–100.0)
PLATELETS: 226 10*3/uL (ref 150–400)
RBC: 3.66 MIL/uL — ABNORMAL LOW (ref 3.87–5.11)
RDW: 13.2 % (ref 11.5–15.5)
WBC: 5.3 10*3/uL (ref 4.0–10.5)

## 2013-11-18 LAB — URINALYSIS, ROUTINE W REFLEX MICROSCOPIC
BILIRUBIN URINE: NEGATIVE
Glucose, UA: NEGATIVE mg/dL
Hgb urine dipstick: NEGATIVE
KETONES UR: NEGATIVE mg/dL
NITRITE: POSITIVE — AB
PH: 7 (ref 5.0–8.0)
Protein, ur: NEGATIVE mg/dL
Specific Gravity, Urine: 1.009 (ref 1.005–1.030)
UROBILINOGEN UA: 0.2 mg/dL (ref 0.0–1.0)

## 2013-11-18 LAB — I-STAT TROPONIN, ED: Troponin i, poc: 0.01 ng/mL (ref 0.00–0.08)

## 2013-11-18 MED ORDER — CIPROFLOXACIN IN D5W 400 MG/200ML IV SOLN
400.0000 mg | Freq: Once | INTRAVENOUS | Status: AC
  Administered 2013-11-18: 400 mg via INTRAVENOUS
  Filled 2013-11-18: qty 200

## 2013-11-18 MED ORDER — DIPHENHYDRAMINE HCL 50 MG/ML IJ SOLN
INTRAMUSCULAR | Status: AC
  Filled 2013-11-18: qty 1

## 2013-11-18 MED ORDER — ACETAMINOPHEN 500 MG PO TABS
1000.0000 mg | ORAL_TABLET | Freq: Once | ORAL | Status: AC
  Administered 2013-11-18: 1000 mg via ORAL
  Filled 2013-11-18: qty 2

## 2013-11-18 MED ORDER — CEPHALEXIN 250 MG PO CAPS
250.0000 mg | ORAL_CAPSULE | Freq: Once | ORAL | Status: DC
  Filled 2013-11-18: qty 1

## 2013-11-18 MED ORDER — CLINDAMYCIN HCL 300 MG PO CAPS: 300.0000 mg | ORAL_CAPSULE | Freq: Once | ORAL | Status: DC

## 2013-11-18 MED ORDER — MECLIZINE HCL 25 MG PO TABS
25.0000 mg | ORAL_TABLET | Freq: Once | ORAL | Status: AC
  Administered 2013-11-18: 25 mg via ORAL
  Filled 2013-11-18: qty 1

## 2013-11-18 MED ORDER — DIPHENHYDRAMINE HCL 50 MG/ML IJ SOLN
25.0000 mg | Freq: Once | INTRAMUSCULAR | Status: AC
  Administered 2013-11-18: 25 mg via INTRAVENOUS

## 2013-11-18 MED ORDER — NITROFURANTOIN MACROCRYSTAL 100 MG PO CAPS: 100.0000 mg | ORAL_CAPSULE | Freq: Two times a day (BID) | ORAL | Status: DC

## 2013-11-18 NOTE — ED Notes (Addendum)
Per pt dizziness starting an hour ago. Pt reports blurry vision with dizziness. Pt denies CP or SOB. Pt states has chronic back pain but worsened over past week; pt suspected UTI related to increased pain in lower back, frequency in urination, and odor to urine. Pt reports checked for UTI on Wednesday but has not heard results yet.

## 2013-11-18 NOTE — ED Notes (Signed)
Pt reports itching subsided.

## 2013-11-18 NOTE — ED Notes (Signed)
Pt reports itching to left arm. Arm appears reddened. Antibiotic stopped and line flushed with normal saline. MD notified. Pt denies SOB or CP. VS stable.

## 2013-11-18 NOTE — ED Notes (Signed)
MD reports waiting for return call from pt PCP. Pt aware of plan on care at present time.

## 2013-11-18 NOTE — ED Notes (Signed)
Discharge instructions reviewed by [facility] with pt. Per [place] pt denies questions.

## 2013-11-18 NOTE — ED Notes (Signed)
MD at bedside. 

## 2013-11-18 NOTE — Discharge Instructions (Signed)
Dizziness °Dizziness is a common problem. It is a feeling of unsteadiness or light-headedness. You may feel like you are about to faint. Dizziness can lead to injury if you stumble or fall. A person of any age group can suffer from dizziness, but dizziness is more common in older adults. °CAUSES  °Dizziness can be caused by many different things, including: °· Middle ear problems. °· Standing for too long. °· Infections. °· An allergic reaction. °· Aging. °· An emotional response to something, such as the sight of blood. °· Side effects of medicines. °· Tiredness. °· Problems with circulation or blood pressure. °· Excessive use of alcohol or medicines, or illegal drug use. °· Breathing too fast (hyperventilation). °· An irregular heart rhythm (arrhythmia). °· A low red blood cell count (anemia). °· Pregnancy. °· Vomiting, diarrhea, fever, or other illnesses that cause body fluid loss (dehydration). °· Diseases or conditions such as Parkinson's disease, high blood pressure (hypertension), diabetes, and thyroid problems. °· Exposure to extreme heat. °DIAGNOSIS  °Your health care provider will ask about your symptoms, perform a physical exam, and perform an electrocardiogram (ECG) to record the electrical activity of your heart. Your health care provider may also perform other heart or blood tests to determine the cause of your dizziness. These may include: °· Transthoracic echocardiogram (TTE). During echocardiography, sound waves are used to evaluate how blood flows through your heart. °· Transesophageal echocardiogram (TEE). °· Cardiac monitoring. This allows your health care provider to monitor your heart rate and rhythm in real time. °· Holter monitor. This is a portable device that records your heartbeat and can help diagnose heart arrhythmias. It allows your health care provider to track your heart activity for several days if needed. °· Stress tests by exercise or by giving medicine that makes the heart beat  faster. °TREATMENT  °Treatment of dizziness depends on the cause of your symptoms and can vary greatly. °HOME CARE INSTRUCTIONS  °· Drink enough fluids to keep your urine clear or pale yellow. This is especially important in very hot weather. In older adults, it is also important in cold weather. °· Take your medicine exactly as directed if your dizziness is caused by medicines. When taking blood pressure medicines, it is especially important to get up slowly. °¨ Rise slowly from chairs and steady yourself until you feel okay. °¨ In the morning, first sit up on the side of the bed. When you feel okay, stand slowly while holding onto something until you know your balance is fine. °· Move your legs often if you need to stand in one place for a long time. Tighten and relax your muscles in your legs while standing. °· Have someone stay with you for 1-2 days if dizziness continues to be a problem. Do this until you feel you are well enough to stay alone. Have the person call your health care provider if he or she notices changes in you that are concerning. °· Do not drive or use heavy machinery if you feel dizzy. °· Do not drink alcohol. °SEEK IMMEDIATE MEDICAL CARE IF:  °· Your dizziness or light-headedness gets worse. °· You feel nauseous or vomit. °· You have problems talking, walking, or using your arms, hands, or legs. °· You feel weak. °· You are not thinking clearly or you have trouble forming sentences. It may take a friend or family member to notice this. °· You have chest pain, abdominal pain, shortness of breath, or sweating. °· Your vision changes. °· You notice   any bleeding.  You have side effects from medicine that seems to be getting worse rather than better. MAKE SURE YOU:   Understand these instructions.  Will watch your condition.  Will get help right away if you are not doing well or get worse. Document Released: 09/24/2000 Document Revised: 04/05/2013 Document Reviewed: 10/18/2010 [facility]  Patient Information 2015 [place], [place]. This information is not intended to replace advice given to you by your health care provider. Make sure you discuss any questions you have with your health care provider.  Urinary Tract Infection Urinary tract infections (UTIs) can develop anywhere along your urinary tract. Your urinary tract is your body's drainage system for removing wastes and extra water. Your urinary tract includes two kidneys, two ureters, a bladder, and a urethra. Your kidneys are a pair of bean-shaped organs. Each kidney is about the size of your fist. They are located below your ribs, one on each side of your spine. CAUSES Infections are caused by microbes, which are microscopic organisms, including fungi, viruses, and bacteria. These organisms are so small that they can only be seen through a microscope. Bacteria are the microbes that most commonly cause UTIs. SYMPTOMS  Symptoms of UTIs may vary by age and gender of the patient and by the location of the infection. Symptoms in young women typically include a frequent and intense urge to urinate and a painful, burning feeling in the bladder or urethra during urination. Older women and men are more likely to be tired, shaky, and weak and have muscle aches and abdominal pain. A fever may mean the infection is in your kidneys. Other symptoms of a kidney infection include pain in your back or sides below the ribs, nausea, and vomiting. DIAGNOSIS To diagnose a UTI, your caregiver will ask you about your symptoms. Your caregiver also will ask to provide a urine sample. The urine sample will be tested for bacteria and white blood cells. White blood cells are made by your body to help fight infection. TREATMENT  Typically, UTIs can be treated with medication. Because most UTIs are caused by a bacterial infection, they usually can be treated with the use of antibiotics. The choice of antibiotic and length of treatment depend on your symptoms and  the type of bacteria causing your infection. HOME CARE INSTRUCTIONS  If you were prescribed antibiotics, take them exactly as your caregiver instructs you. Finish the medication even if you feel better after you have only taken some of the medication.  Drink enough water and fluids to keep your urine clear or pale yellow.  Avoid caffeine, tea, and carbonated beverages. They tend to irritate your bladder.  Empty your bladder often. Avoid holding urine for long periods of time.  Empty your bladder before and after sexual intercourse.  After a bowel movement, women should cleanse from front to back. Use each tissue only once. SEEK MEDICAL CARE IF:   You have back pain.  You develop a fever.  Your symptoms do not begin to resolve within 3 days. SEEK IMMEDIATE MEDICAL CARE IF:   You have severe back pain or lower abdominal pain.  You develop chills.  You have nausea or vomiting.  You have continued burning or discomfort with urination. MAKE SURE YOU:   Understand these instructions.  Will watch your condition.  Will get help right away if you are not doing well or get worse. Document Released: 01/08/2005 Document Revised: 09/30/2011 Document Reviewed: 05/09/2011 [facility] Patient Information 2015 [place], [place]. This information is not  intended to replace advice given to you by your health care provider. Make sure you discuss any questions you have with your health care provider. ° °

## 2013-11-18 NOTE — ED Provider Notes (Signed)
CSN: [id number]     Arrival date & time 11/18/13  1019 History   First MD Initiated Contact with Patient 11/18/13 1023     Chief Complaint  Patient presents with  . Dizziness     (Consider location/radiation/quality/duration/timing/severity/associated sxs/prior Treatment) Patient is a 80 y.o. female presenting with dizziness. The history is provided by the patient.  Dizziness Quality:  Lightheadedness Severity:  Moderate Onset quality:  Sudden Timing:  Constant Progression:  Unchanged Chronicity:  New Context: standing up   Context: not when bending over and not with bowel movement   Relieved by:  Nothing Worsened by:  Nothing tried Ineffective treatments:  None tried Associated symptoms: no chest pain, no shortness of breath and no vomiting     Past Medical History  Diagnosis Date  . Hypertension   . Hyperlipidemia   . Aortic valve disorders   . Unspecified hereditary and idiopathic peripheral neuropathy   . Other abnormal glucose   . Unspecified hearing loss   . Unspecified chronic bronchitis   . Unspecified hypothyroidism   . Unspecified gastritis and gastroduodenitis without mention of hemorrhage   . Diverticulosis of colon (without mention of hemorrhage)   . Irritable bowel syndrome   . Osteoarthrosis, unspecified whether generalized or localized, unspecified site   . Backache, unspecified   . Pain in limb   . Osteoporosis, unspecified   . Herpes zoster with other nervous system complications(053.19)   . Anxiety state, unspecified   . Anemia, unspecified   . Bradycardia     s/p PPM   Past Surgical History  Procedure Laterality Date  . Pacemaker insertion      MDT  . Appendectomy    . Cholecystectomy    . Tonsillectomy    . Vaginal hysterectomy    . Cataract extraction, bilateral  end of June 2013  . Cataract extraction, bilateral  beginning of July 2013   Family History  Problem Relation Age of Onset  . Heart disease Father   . Cancer Paternal  Grandmother   . Diabetes Father   . Heart disease Mother    History  Substance Use Topics  . Smoking status: Never Smoker   . Smokeless tobacco: Never Used  . Alcohol Use: No   OB History   Grav Para Term Preterm Abortions TAB SAB Ect Mult Living                 Review of Systems  Constitutional: Negative for fever and chills.  Respiratory: Negative for cough and shortness of breath.   Cardiovascular: Negative for chest pain and leg swelling.  Gastrointestinal: Negative for vomiting.  Neurological: Positive for dizziness.  All other systems reviewed and are negative.     Allergies  Penicillins; Alprazolam; Bactrim; Ceclor; Cephalexin; Clindamycin/lincomycin; Hydrocodone; Noroxin; Sulfonamide derivatives; and Tetracyclines & related  Home Medications   Prior to Admission medications   Medication Sig Start Date End Date Taking? Authorizing Provider  aspirin 325 MG tablet Take 325 mg by mouth every morning.    Yes Historical Provider, MD  cholecalciferol (VITAMIN D) 1000 UNITS tablet Take 1,000 Units by mouth daily.     Yes Historical Provider, MD  Cyanocobalamin (B-12) 2500 MCG TABS Take 2,500 mcg by mouth daily.    Yes Historical Provider, MD  levothyroxine (SYNTHROID) 125 MCG tablet Take 1 tablet (125 mcg total) by mouth daily before breakfast. 10/28/13  Yes Noralee Space, MD  losartan-hydrochlorothiazide (HYZAAR) 50-12.5 MG per tablet Take 0.5 tablets by mouth every morning.  Yes Historical Provider, MD  polyethylene glycol (MIRALAX / GLYCOLAX) packet Take 17 g by mouth as needed. 09/29/12  Yes Marin Olp, MD  pregabalin (LYRICA) 75 MG capsule Take 1 capsule (75 mg total) by mouth at bedtime. 11/15/13 11/15/14 Yes Aleksei Plotnikov V, MD  simvastatin (ZOCOR) 40 MG tablet Take 1 tablet (40 mg total) by mouth daily. 11/16/13   Aleksei Plotnikov V, MD   BP 196/77  Pulse 63  Temp(Src) 97.9 F (36.6 C) (Oral)  Resp 16  SpO2 100% Physical Exam  Nursing note and vitals  reviewed. Constitutional: She is oriented to person, place, and time. She appears well-developed and well-nourished. No distress.  HENT:  Head: Normocephalic and atraumatic.  Mouth/Throat: Oropharynx is clear and moist.  L TM clear, R TM occluded due to wax.  Eyes: EOM are normal. Pupils are equal, round, and reactive to light.  Neck: Normal range of motion. Neck supple.  Cardiovascular: Normal rate and regular rhythm.  Exam reveals no friction rub.   No murmur heard. Pulmonary/Chest: Effort normal and breath sounds normal. No respiratory distress. She has no wheezes. She has no rales.  Abdominal: Soft. She exhibits no distension. There is no tenderness. There is no rebound.  Musculoskeletal: Normal range of motion. She exhibits no edema.  Neurological: She is alert and oriented to person, place, and time. No cranial nerve deficit. She exhibits normal muscle tone. Coordination normal.  Skin: No rash noted. She is not diaphoretic.    ED Course  Procedures (including critical care time) Labs Review Labs Reviewed  CBC  BASIC METABOLIC PANEL  URINALYSIS, ROUTINE W REFLEX MICROSCOPIC  I-STAT English, ED    Imaging Review Ct Head Wo Contrast  11/18/2013   CLINICAL DATA:  Dizziness and blurry vision.  EXAM: CT HEAD WITHOUT CONTRAST  TECHNIQUE: Contiguous axial images were obtained from the base of the skull through the vertex without intravenous contrast.  COMPARISON:  01/13/2013  FINDINGS: There is no evidence of acute cortical infarct, intracranial hemorrhage, mass, midline shift, or extra-axial fluid collection. Ventricles and sulci are within normal limits for age. Periventricular white matter hypodensities are unchanged and nonspecific but compatible with mild chronic small vessel ischemic disease. Remote lacunar infarct is again noted in the left caudate head.  Prior bilateral cataract extraction is noted. Mastoid air cells and visualized paranasal sinuses are clear. Moderate carotid  siphon atherosclerosis is noted.  IMPRESSION: 1. No evidence of acute intracranial abnormality. 2. Mild chronic small vessel ischemic disease.   Electronically Signed   By: Logan Bores   On: 11/18/2013 11:25   Dg Chest Port 1 View  11/18/2013   CLINICAL DATA:  Dizziness.  EXAM: PORTABLE CHEST - 1 VIEW  COMPARISON:  05/10/2013  FINDINGS: Stable appearance of right-sided dual-chamber pacemaker. The heart size is normal. Stable mild bilateral parenchymal scarring. There is no evidence of pulmonary edema, consolidation, pneumothorax, nodule or pleural fluid.  IMPRESSION: No active disease.   Electronically Signed   By: Aletta Edouard M.D.   On: 11/18/2013 11:32     EKG Interpretation   Date/Time:  Friday November 18 2013 10:30:53 EDT Ventricular Rate:  62 PR Interval:  202 QRS Duration: 159 QT Interval:  476 QTC Calculation: 483 R Axis:   -64 Text Interpretation:  Atrial-sensed ventricular-paced rhythm No further  analysis attempted due to paced rhythm Baseline wander in lead(s) II III  aVR aVF Similar to prior Confirmed by Mingo Amber  MD, [place] ([id number]) on 11/18/2013  3:57:49 PM  MDM   Final diagnoses:  UTI (lower urinary tract infection)  Dizziness    80 year old female presents with dizziness. Began while standing up. Described as lightheadedness with near-syncope. Has had history of vertigo and dizziness due to heart rate issues, states it feels different than above. No room spinning sensation. Had some transient blurry vision. Dizziness did not resolve but did improve with sitting down. No nausea or vomiting. No chest pain or shortness of breath. She said she had trouble walking due to dizziness and was afraid to go to the bathroom. She is a very poor historian.  On exam vitals are stable except for some hypertension. Right TM occluded due to cerumen, left TM clear. No nystagmus, normal cranial nerves. Normal sensation and coordination. Belly is benign. Lungs clear. We'll check labs, head  CT. She is on aspirin but not on any other anticoagulants. We'll give meclizine to help with vertigo she has a history of the same. We'll interrogate her pacemaker. Also check for UTI because she had it checked by Dr. Dorann Lodge cough previously this week and has not heard anything. UTI on labs. Feeling much better, dizziness resolved. Given cipro for UTi, but her arm became red - benadryl given, cipro stopped. Multiple allergies, I spoke with Dr. Alain Marion, who stated macrodantin had worked in the past, patient confirmed this. Given macrodantin, can f/u with Dr. Alain Marion in 2-3 days. Pacemaker interrogated - no evidence of arrhythmia today (or within past 2-3 weeks).  Evelina Bucy, MD 11/18/13 [phone]

## 2013-11-18 NOTE — ED Notes (Addendum)
Pt transported to CT/xray at present time.

## 2013-11-21 ENCOUNTER — Telehealth: Payer: Self-pay | Admitting: Internal Medicine

## 2013-11-21 NOTE — Telephone Encounter (Signed)
Pt stated that she was in ER on 11-18-13 and her device was interrogated. Pt wanted to know if she still needed to send a remote transmission on 12-05-13. I informed pt to still send the transmission on 12-05-13. Pt verbalized understanding.

## 2013-11-21 NOTE — Telephone Encounter (Signed)
New message  Pt called requests a call back to discuss if she will or will not have to send in remote check.. Please call

## 2013-11-29 ENCOUNTER — Encounter: Payer: Self-pay | Admitting: Internal Medicine

## 2013-12-02 DIAGNOSIS — M76899 Other specified enthesopathies of unspecified lower limb, excluding foot: Secondary | ICD-10-CM | POA: Diagnosis not present

## 2013-12-02 DIAGNOSIS — M47817 Spondylosis without myelopathy or radiculopathy, lumbosacral region: Secondary | ICD-10-CM | POA: Diagnosis not present

## 2013-12-05 ENCOUNTER — Ambulatory Visit (INDEPENDENT_AMBULATORY_CARE_PROVIDER_SITE_OTHER): Payer: MEDICARE | Admitting: *Deleted

## 2013-12-05 ENCOUNTER — Encounter: Payer: Self-pay | Admitting: Internal Medicine

## 2013-12-05 ENCOUNTER — Telehealth: Payer: Self-pay | Admitting: Cardiology

## 2013-12-05 DIAGNOSIS — I495 Sick sinus syndrome: Secondary | ICD-10-CM | POA: Diagnosis not present

## 2013-12-05 LAB — MDC_IDC_ENUM_SESS_TYPE_REMOTE
Battery Remaining Longevity: 111 mo
Battery Voltage: 2.78 V
Lead Channel Impedance Value: 527 Ohm
Lead Channel Pacing Threshold Amplitude: 0.625 V
Lead Channel Pacing Threshold Pulse Width: 0.4 ms
Lead Channel Sensing Intrinsic Amplitude: 2.8 mV
Lead Channel Setting Pacing Amplitude: 2 V
Lead Channel Setting Pacing Pulse Width: 0.4 ms
Lead Channel Setting Sensing Sensitivity: 5.6 mV
MDC IDC MSMT BATTERY IMPEDANCE: 205 Ohm
MDC IDC MSMT LEADCHNL RA IMPEDANCE VALUE: 479 Ohm
MDC IDC MSMT LEADCHNL RA PACING THRESHOLD AMPLITUDE: 0.5 V
MDC IDC MSMT LEADCHNL RA PACING THRESHOLD PULSEWIDTH: 0.4 ms
MDC IDC SESS DTM: 20150824233045
MDC IDC SET LEADCHNL RV PACING AMPLITUDE: 2.5 V
MDC IDC STAT BRADY AP VP PERCENT: 21 %
MDC IDC STAT BRADY AP VS PERCENT: 0 %
MDC IDC STAT BRADY AS VP PERCENT: 79 %
MDC IDC STAT BRADY AS VS PERCENT: 0 %

## 2013-12-05 NOTE — Telephone Encounter (Signed)
Spoke with pt and reminded pt of remote transmission that is due today. Pt verbalized understanding.   

## 2013-12-06 NOTE — Progress Notes (Signed)
Remote pacemaker transmission.   

## 2013-12-09 DIAGNOSIS — Z23 Encounter for immunization: Secondary | ICD-10-CM | POA: Diagnosis not present

## 2013-12-13 ENCOUNTER — Encounter: Payer: Self-pay | Admitting: Cardiology

## 2013-12-28 DIAGNOSIS — L299 Pruritus, unspecified: Secondary | ICD-10-CM | POA: Diagnosis not present

## 2013-12-28 DIAGNOSIS — D049 Carcinoma in situ of skin, unspecified: Secondary | ICD-10-CM | POA: Diagnosis not present

## 2013-12-28 DIAGNOSIS — L570 Actinic keratosis: Secondary | ICD-10-CM | POA: Diagnosis not present

## 2014-01-16 ENCOUNTER — Emergency Department (HOSPITAL_COMMUNITY): Payer: MEDICARE

## 2014-01-16 ENCOUNTER — Encounter (HOSPITAL_COMMUNITY): Payer: Self-pay | Admitting: Emergency Medicine

## 2014-01-16 ENCOUNTER — Inpatient Hospital Stay (HOSPITAL_COMMUNITY)
Admission: EM | Admit: 2014-01-16 | Discharge: 2014-01-21 | DRG: 038 | Disposition: A | Discharge status: Home / Self Care | Payer: MEDICARE | Attending: Internal Medicine | Admitting: Internal Medicine

## 2014-01-16 DIAGNOSIS — Z950 Presence of cardiac pacemaker: Secondary | ICD-10-CM

## 2014-01-16 DIAGNOSIS — F411 Generalized anxiety disorder: Secondary | ICD-10-CM | POA: Diagnosis present

## 2014-01-16 DIAGNOSIS — I10 Essential (primary) hypertension: Secondary | ICD-10-CM | POA: Diagnosis not present

## 2014-01-16 DIAGNOSIS — M810 Age-related osteoporosis without current pathological fracture: Secondary | ICD-10-CM | POA: Diagnosis present

## 2014-01-16 DIAGNOSIS — I638 Other cerebral infarction: Secondary | ICD-10-CM

## 2014-01-16 DIAGNOSIS — K589 Irritable bowel syndrome without diarrhea: Secondary | ICD-10-CM | POA: Diagnosis present

## 2014-01-16 DIAGNOSIS — Z01810 Encounter for preprocedural cardiovascular examination: Secondary | ICD-10-CM | POA: Diagnosis not present

## 2014-01-16 DIAGNOSIS — R609 Edema, unspecified: Secondary | ICD-10-CM

## 2014-01-16 DIAGNOSIS — N390 Urinary tract infection, site not specified: Secondary | ICD-10-CM

## 2014-01-16 DIAGNOSIS — E871 Hypo-osmolality and hyponatremia: Secondary | ICD-10-CM

## 2014-01-16 DIAGNOSIS — Z7982 Long term (current) use of aspirin: Secondary | ICD-10-CM | POA: Diagnosis not present

## 2014-01-16 DIAGNOSIS — I495 Sick sinus syndrome: Secondary | ICD-10-CM

## 2014-01-16 DIAGNOSIS — R32 Unspecified urinary incontinence: Secondary | ICD-10-CM

## 2014-01-16 DIAGNOSIS — R300 Dysuria: Secondary | ICD-10-CM

## 2014-01-16 DIAGNOSIS — H9190 Unspecified hearing loss, unspecified ear: Secondary | ICD-10-CM | POA: Diagnosis present

## 2014-01-16 DIAGNOSIS — G453 Amaurosis fugax: Secondary | ICD-10-CM | POA: Diagnosis present

## 2014-01-16 DIAGNOSIS — E039 Hypothyroidism, unspecified: Secondary | ICD-10-CM | POA: Diagnosis present

## 2014-01-16 DIAGNOSIS — I6521 Occlusion and stenosis of right carotid artery: Secondary | ICD-10-CM | POA: Diagnosis not present

## 2014-01-16 DIAGNOSIS — T8351XD Infection and inflammatory reaction due to indwelling urinary catheter, subsequent encounter: Secondary | ICD-10-CM | POA: Diagnosis not present

## 2014-01-16 DIAGNOSIS — N3000 Acute cystitis without hematuria: Secondary | ICD-10-CM

## 2014-01-16 DIAGNOSIS — Z8249 Family history of ischemic heart disease and other diseases of the circulatory system: Secondary | ICD-10-CM

## 2014-01-16 DIAGNOSIS — T83511D Infection and inflammatory reaction due to indwelling urethral catheter, subsequent encounter: Secondary | ICD-10-CM

## 2014-01-16 DIAGNOSIS — J9811 Atelectasis: Secondary | ICD-10-CM | POA: Diagnosis not present

## 2014-01-16 DIAGNOSIS — R531 Weakness: Secondary | ICD-10-CM | POA: Diagnosis not present

## 2014-01-16 DIAGNOSIS — I639 Cerebral infarction, unspecified: Secondary | ICD-10-CM | POA: Diagnosis not present

## 2014-01-16 DIAGNOSIS — I63239 Cerebral infarction due to unspecified occlusion or stenosis of unspecified carotid arteries: Secondary | ICD-10-CM | POA: Diagnosis not present

## 2014-01-16 DIAGNOSIS — Z8673 Personal history of transient ischemic attack (TIA), and cerebral infarction without residual deficits: Secondary | ICD-10-CM

## 2014-01-16 DIAGNOSIS — I6389 Other cerebral infarction: Secondary | ICD-10-CM

## 2014-01-16 DIAGNOSIS — I369 Nonrheumatic tricuspid valve disorder, unspecified: Secondary | ICD-10-CM | POA: Diagnosis not present

## 2014-01-16 DIAGNOSIS — J40 Bronchitis, not specified as acute or chronic: Secondary | ICD-10-CM | POA: Diagnosis not present

## 2014-01-16 DIAGNOSIS — I6523 Occlusion and stenosis of bilateral carotid arteries: Secondary | ICD-10-CM | POA: Diagnosis not present

## 2014-01-16 DIAGNOSIS — R197 Diarrhea, unspecified: Secondary | ICD-10-CM

## 2014-01-16 DIAGNOSIS — R252 Cramp and spasm: Secondary | ICD-10-CM

## 2014-01-16 DIAGNOSIS — D649 Anemia, unspecified: Secondary | ICD-10-CM | POA: Diagnosis not present

## 2014-01-16 DIAGNOSIS — E785 Hyperlipidemia, unspecified: Secondary | ICD-10-CM | POA: Diagnosis not present

## 2014-01-16 DIAGNOSIS — J189 Pneumonia, unspecified organism: Secondary | ICD-10-CM

## 2014-01-16 DIAGNOSIS — H538 Other visual disturbances: Secondary | ICD-10-CM | POA: Diagnosis not present

## 2014-01-16 LAB — CBC WITH DIFFERENTIAL/PLATELET
BASOS ABS: 0 10*3/uL (ref 0.0–0.1)
BASOS PCT: 1 % (ref 0–1)
EOS ABS: 0.2 10*3/uL (ref 0.0–0.7)
Eosinophils Relative: 3 % (ref 0–5)
HCT: 34.8 % — ABNORMAL LOW (ref 36.0–46.0)
Hemoglobin: 11.5 g/dL — ABNORMAL LOW (ref 12.0–15.0)
Lymphocytes Relative: 30 % (ref 12–46)
Lymphs Abs: 1.7 10*3/uL (ref 0.7–4.0)
MCH: 31.5 pg (ref 26.0–34.0)
MCHC: 33 g/dL (ref 30.0–36.0)
MCV: 95.3 fL (ref 78.0–100.0)
Monocytes Absolute: 0.5 10*3/uL (ref 0.1–1.0)
Monocytes Relative: 9 % (ref 3–12)
NEUTROS ABS: 3.3 10*3/uL (ref 1.7–7.7)
NEUTROS PCT: 57 % (ref 43–77)
PLATELETS: 234 10*3/uL (ref 150–400)
RBC: 3.65 MIL/uL — ABNORMAL LOW (ref 3.87–5.11)
RDW: 12.9 % (ref 11.5–15.5)
WBC: 5.8 10*3/uL (ref 4.0–10.5)

## 2014-01-16 LAB — URINALYSIS, ROUTINE W REFLEX MICROSCOPIC
BILIRUBIN URINE: NEGATIVE
Glucose, UA: NEGATIVE mg/dL
Ketones, ur: NEGATIVE mg/dL
Nitrite: NEGATIVE
PROTEIN: NEGATIVE mg/dL
SPECIFIC GRAVITY, URINE: 1.014 (ref 1.005–1.030)
Urobilinogen, UA: 0.2 mg/dL (ref 0.0–1.0)
pH: 6 (ref 5.0–8.0)

## 2014-01-16 LAB — URINE MICROSCOPIC-ADD ON

## 2014-01-16 LAB — BASIC METABOLIC PANEL
Anion gap: 11 (ref 5–15)
BUN: 23 mg/dL (ref 6–23)
CHLORIDE: 97 meq/L (ref 96–112)
CO2: 25 mEq/L (ref 19–32)
Calcium: 9.2 mg/dL (ref 8.4–10.5)
Creatinine, Ser: 0.88 mg/dL (ref 0.50–1.10)
GFR calc non Af Amer: 57 mL/min — ABNORMAL LOW (ref >90)
GFR, EST AFRICAN AMERICAN: 66 mL/min — AB (ref >90)
Glucose, Bld: 209 mg/dL — ABNORMAL HIGH (ref 70–99)
POTASSIUM: 4.4 meq/L (ref 3.7–5.3)
Sodium: 133 mEq/L — ABNORMAL LOW (ref 137–147)

## 2014-01-16 MED ORDER — NITROFURANTOIN MONOHYD MACRO 100 MG PO CAPS
100.0000 mg | ORAL_CAPSULE | Freq: Once | ORAL | Status: AC
  Administered 2014-01-16: 100 mg via ORAL
  Filled 2014-01-16: qty 1

## 2014-01-16 NOTE — ED Provider Notes (Addendum)
TIME SEEN: 8:00 PM  CHIEF COMPLAINT: Right-sided vision changes  HPI: Patient is a 80 y.o. F with history of hypertension, hyperlipidemia, hypothyroidism, sinus node dysfunction status post pacemaker who presents emergency department with right visual field deficit that occurred at 6:15 PM and lasted approximately 15 minutes. She reports she took two 325 mg aspirins when her symptoms started. She normally takes aspirin daily but has not been taking it for the past 2 days because she was concerned she may receive an epidural injection with her orthopedist this week. She denies any numbness, tingling or focal weakness. Denies any changes in speech or hearing. No vision loss. No floaters or flashes. No eye pain. No history of trauma to the eye. No headache. No head injury. She reports she has had "many TIAs" in the past that she was not aware of but were diagnosed by brain imaging.  ROS: See HPI Constitutional: no fever  Eyes: no drainage  ENT: no runny nose   Cardiovascular:  no chest pain  Resp: no SOB  GI: no vomiting GU: no dysuria Integumentary: no rash  Allergy: no hives  Musculoskeletal: no leg swelling  Neurological: no slurred speech ROS otherwise negative  PAST MEDICAL HISTORY/PAST SURGICAL HISTORY:  Past Medical History  Diagnosis Date  . Hypertension   . Hyperlipidemia   . Aortic valve disorders   . Unspecified hereditary and idiopathic peripheral neuropathy   . Other abnormal glucose   . Unspecified hearing loss   . Unspecified chronic bronchitis   . Unspecified hypothyroidism   . Unspecified gastritis and gastroduodenitis without mention of hemorrhage   . Diverticulosis of colon (without mention of hemorrhage)   . Irritable bowel syndrome   . Osteoarthrosis, unspecified whether generalized or localized, unspecified site   . Backache, unspecified   . Pain in limb   . Osteoporosis, unspecified   . Herpes zoster with other nervous system complications(053.19)   .  Anxiety state, unspecified   . Anemia, unspecified   . Bradycardia     s/p PPM    MEDICATIONS:  Prior to Admission medications   Medication Sig Start Date End Date Taking? Authorizing Provider  acetaminophen (TYLENOL) 500 MG tablet Take 500 mg by mouth every 6 (six) hours as needed for moderate pain.   Yes Historical Provider, MD  aspirin 325 MG tablet Take 325 mg by mouth every morning.    Yes Historical Provider, MD  cholecalciferol (VITAMIN D) 1000 UNITS tablet Take 1,000 Units by mouth daily.     Yes Historical Provider, MD  Cyanocobalamin (B-12) 2500 MCG TABS Take 2,500 mcg by mouth daily.    Yes Historical Provider, MD  hydroxypropyl methylcellulose / hypromellose (ISOPTO TEARS / GONIOVISC) 2.5 % ophthalmic solution Place 1 drop into both eyes 2 (two) times daily.   Yes Historical Provider, MD  levothyroxine (SYNTHROID) 125 MCG tablet Take 1 tablet (125 mcg total) by mouth daily before breakfast. 10/28/13  Yes Noralee Space, MD  losartan-hydrochlorothiazide (HYZAAR) 50-12.5 MG per tablet Take 0.5 tablets by mouth every morning.   Yes Historical Provider, MD  Multiple Vitamins-Minerals (EYE VITAMINS PO) Take 1 tablet by mouth daily.   Yes Historical Provider, MD  polyethylene glycol (MIRALAX / GLYCOLAX) packet Take 17 g by mouth daily as needed for moderate constipation.  09/29/12  Yes Marin Olp, MD  pregabalin (LYRICA) 75 MG capsule Take 1 capsule (75 mg total) by mouth at bedtime. 11/15/13 11/15/14 Yes Aleksei Plotnikov V, MD  simvastatin (ZOCOR) 40 MG tablet  Take 1 tablet (40 mg total) by mouth daily. 11/16/13  Yes Aleksei Plotnikov V, MD  sodium chloride (OCEAN) 0.65 % SOLN nasal spray Place 1 spray into both nostrils 2 (two) times daily as needed for congestion.   Yes Historical Provider, MD    ALLERGIES:  Allergies  Allergen Reactions  . Penicillins Anaphylaxis    REACTION: swells up all over body, pt states she almost died  . Alprazolam      REACTION: eyes swell up  .  Bactrim [Sulfamethoxazole-Trimethoprim]     Unable to remember the side effects  . Ceclor [Cefaclor]     Unsure of the side effects  . Cephalexin     Unsure of the side effects  . Ciprofloxacin Itching and Swelling  . Clindamycin/Lincomycin     Upset stomach  . Hydrocodone     Unsure of reaction to med  . Noroxin [Norfloxacin]     Unsure of side effects  . Sulfonamide Derivatives     REACTION: itching  . Tetracyclines & Related     Unsure of the reaction to this medication    SOCIAL HISTORY:  History  Substance Use Topics  . Smoking status: Never Smoker   . Smokeless tobacco: Never Used  . Alcohol Use: No    FAMILY HISTORY: Family History  Problem Relation Age of Onset  . Heart disease Father   . Cancer Paternal Grandmother   . Diabetes Father   . Heart disease Mother     EXAM: BP 137/56  Pulse 74  Temp(Src) 97.5 F (36.4 C) (Oral)  Resp 16  SpO2 99% CONSTITUTIONAL: Alert and oriented and responds appropriately to questions. Well-appearing; well-nourished HEAD: Normocephalic EYES: Conjunctivae clear, PERRL, extraocular movements intact, no hyphema or hypopyon ENT: normal nose; no rhinorrhea; moist mucous membranes; pharynx without lesions noted NECK: Supple, no meningismus, no LAD  CARD: RRR; S1 and S2 appreciated; no murmurs, no clicks, no rubs, no gallops RESP: Normal chest excursion without splinting or tachypnea; breath sounds clear and equal bilaterally; no wheezes, no rhonchi, no rales,  ABD/GI: Normal bowel sounds; non-distended; soft, non-tender, no rebound, no guarding BACK:  The back appears normal and is non-tender to palpation, there is no CVA tenderness EXT: Normal ROM in all joints; non-tender to palpation; no edema; normal capillary refill; no cyanosis    SKIN: Normal color for age and race; warm NEURO: Moves all extremities equally, sensation to light touch intact diffusely, cranial nerves 2 through contact, strength 5/5 in all 4 extremities  except her right lower extremity which is weaker secondary to pain in her back and hip which is chronic, no visual field deficit, extraocular movements intact PSYCH: The patient's mood and manner are appropriate. Grooming and personal hygiene are appropriate.  MEDICAL DECISION MAKING: Patient here with right-sided visual field deficits. She claims that she thought her vision from the right eye was blurry but denies covering her right eye to find out of her left eye was normal. I suspect she may have had right-sided visual field deficit because she describes it as not being able to see things very well on the right side of her visual field. No other neurologic deficits. She is now neurologically intact. This may have been a TIA. We'll obtain labs, urine, head CT, EKG. We'll discuss with neurology.    ED PROGRESS: Labs are unremarkable. Urine shows possible mild urinary tract infection. Culture pending. Will give Macrobid as she is allergic to most antibiotics including penicillins, cephalosporins, fluoroquinolones, sulfa medications.  CT of her head shows a possible left acute subdural lobe infarct. She is still neurologically intact. Discussed with Dr. Nicole Kindred with neurology who recommends admission to medicine and transfer to [facility]. Discussed with patient and family who agree. Discussed with Dr. Hal Hope with hospitalist service for admission to inpatient, telemetry.    EKG Interpretation  Date/Time:  Monday January 16 2014 20:44:25 EDT Ventricular Rate:  67 PR Interval:  214 QRS Duration: 149 QT Interval:  459 QTC Calculation: 485 R Axis:   -72 Text Interpretation:  Atrial-sensed ventricular-paced rhythm No further analysis attempted due to paced rhythm Confirmed by Joandy Burget,  DO, Mildred Tuccillo [phone]) on 01/16/2014 9:04:10 PM        [place], DO 01/16/14 2244   Patient is not a TPA candidate because she is asymptomatic.  [place], DO 01/16/14 2244

## 2014-01-16 NOTE — ED Notes (Signed)
Pt states that her right eye became blurry about 6pm tonight, she took 2 aspirin with no relief, pt doesn't complain of any other sx.

## 2014-01-16 NOTE — ED Notes (Signed)
MD at bedside. 

## 2014-01-16 NOTE — H&P (Signed)
[facility] History and Physical  Kayla Davis VQQ:[id number] DOB: 05-17-1922 DOA: 01/16/2014  Referring physician: ER physician. PCP: [name], MD   Chief Complaint: Right eye blurred vision.  HPI: Kayla Davis is a 80 y.o. female with history of sick sinus syndrome status post pacemaker placement, hypertension, hyperlipidemia, previous history of TIA started experiencing right-sided blurred vision around 6 PM. Patient did not have any associated headache difficulty speaking swallowing dizziness or any loss of function of upper or lower extremities. Patient's symptoms lasted for around 20 minutes and completely resolved. Patient's CT head done in the ER shows features concerning for acute CVA. On-call neurologist Dr. Nicole Kindred was consulted by the ED physician and patient has been admitted for further management. Patient otherwise denies any chest pain or shortness of breath. Patient usually takes aspirin and has not taken last 2 days because patient was planning to go to orthopedic surgeon for possible epidural injection for low back pain.   Review of Systems: As presented in the history of presenting illness, rest negative.  Past Medical History  Diagnosis Date  . Hypertension   . Hyperlipidemia   . Aortic valve disorders   . Unspecified hereditary and idiopathic peripheral neuropathy   . Other abnormal glucose   . Unspecified hearing loss   . Unspecified chronic bronchitis   . Unspecified hypothyroidism   . Unspecified gastritis and gastroduodenitis without mention of hemorrhage   . Diverticulosis of colon (without mention of hemorrhage)   . Irritable bowel syndrome   . Osteoarthrosis, unspecified whether generalized or localized, unspecified site   . Backache, unspecified   . Pain in limb   . Osteoporosis, unspecified   . Herpes zoster with other nervous system complications(053.19)   . Anxiety state, unspecified   . Anemia, unspecified   . Bradycardia     s/p  PPM   Past Surgical History  Procedure Laterality Date  . Pacemaker insertion      MDT  . Appendectomy    . Cholecystectomy    . Tonsillectomy    . Vaginal hysterectomy    . Cataract extraction, bilateral  end of June 2013  . Cataract extraction, bilateral  beginning of July 2013   Social History:  reports that she has never smoked. She has never used smokeless tobacco. She reports that she does not drink alcohol or use illicit drugs. Where does patient live home. Can patient participate in ADLs? Yes.  Allergies  Allergen Reactions  . Penicillins Anaphylaxis    REACTION: swells up all over body, pt states she almost died  . Alprazolam      REACTION: eyes swell up  . Bactrim [Sulfamethoxazole-Trimethoprim]     Unable to remember the side effects  . Ceclor [Cefaclor]     Unsure of the side effects  . Cephalexin     Unsure of the side effects  . Ciprofloxacin Itching and Swelling  . Clindamycin/Lincomycin     Upset stomach  . Hydrocodone     Unsure of reaction to med  . Noroxin [Norfloxacin]     Unsure of side effects  . Sulfonamide Derivatives     REACTION: itching  . Tetracyclines & Related     Unsure of the reaction to this medication    Family History:  Family History  Problem Relation Age of Onset  . Heart disease Father   . Cancer Paternal Grandmother   . Diabetes Father   . Heart disease Mother       Prior  to Admission medications   Medication Sig Start Date End Date Taking? Authorizing Provider  acetaminophen (TYLENOL) 500 MG tablet Take 500 mg by mouth every 6 (six) hours as needed for moderate pain.   Yes Historical Provider, MD  aspirin 325 MG tablet Take 325 mg by mouth every morning.    Yes Historical Provider, MD  cholecalciferol (VITAMIN D) 1000 UNITS tablet Take 1,000 Units by mouth daily.     Yes Historical Provider, MD  Cyanocobalamin (B-12) 2500 MCG TABS Take 2,500 mcg by mouth daily.    Yes Historical Provider, MD  hydroxypropyl  methylcellulose / hypromellose (ISOPTO TEARS / GONIOVISC) 2.5 % ophthalmic solution Place 1 drop into both eyes 2 (two) times daily.   Yes Historical Provider, MD  levothyroxine (SYNTHROID) 125 MCG tablet Take 1 tablet (125 mcg total) by mouth daily before breakfast. 10/28/13  Yes Noralee Space, MD  losartan-hydrochlorothiazide (HYZAAR) 50-12.5 MG per tablet Take 0.5 tablets by mouth every morning.   Yes Historical Provider, MD  Multiple Vitamins-Minerals (EYE VITAMINS PO) Take 1 tablet by mouth daily.   Yes Historical Provider, MD  polyethylene glycol (MIRALAX / GLYCOLAX) packet Take 17 g by mouth daily as needed for moderate constipation.  09/29/12  Yes Marin Olp, MD  pregabalin (LYRICA) 75 MG capsule Take 1 capsule (75 mg total) by mouth at bedtime. 11/15/13 11/15/14 Yes Aleksei Plotnikov V, MD  simvastatin (ZOCOR) 40 MG tablet Take 1 tablet (40 mg total) by mouth daily. 11/16/13  Yes Aleksei Plotnikov V, MD  sodium chloride (OCEAN) 0.65 % SOLN nasal spray Place 1 spray into both nostrils 2 (two) times daily as needed for congestion.   Yes Historical Provider, MD    Physical Exam: Filed Vitals:   01/16/14 1925 01/16/14 2121 01/16/14 2157 01/16/14 2200  BP: 137/56 127/53 128/50 119/48  Pulse: 74 65 64 65  Temp: 97.5 F (36.4 C) 97.6 F (36.4 C) 97.8 F (36.6 C)   TempSrc: Oral Oral Oral   Resp: 16 19 17 19   SpO2: 99% 98% 97% 98%     General:  Well-developed and nourished.  Eyes: Anicteric no pallor. PERRLA positive.  ENT: No discharge from ears eyes nose mouth.  Neck: No mass felt. No neck rigidity.  Cardiovascular: S1-S2 heard.  Respiratory: No rhonchi or crepitations.  Abdomen: Soft nontender bowel sounds present.  Skin: No rash.  Musculoskeletal: No edema.  Psychiatric: Appears normal.  Neurologic: Alert awake oriented to time place and person. Moves all extremities 5 x 5. At this time patient has good vision in both eyes. PERRLA positive. No facial asymmetry. Tongue  is midline.  Labs on Admission:  Basic Metabolic Panel:  Recent Labs Lab 01/16/14 2021  NA 133*  K 4.4  CL 97  CO2 25  GLUCOSE 209*  BUN 23  CREATININE 0.88  CALCIUM 9.2   Liver Function Tests: No results found for this basename: AST, ALT, ALKPHOS, BILITOT, PROT, ALBUMIN,  in the last 168 hours No results found for this basename: LIPASE, AMYLASE,  in the last 168 hours No results found for this basename: AMMONIA,  in the last 168 hours CBC:  Recent Labs Lab 01/16/14 2021  WBC 5.8  NEUTROABS 3.3  HGB 11.5*  HCT 34.8*  MCV 95.3  PLT 234   Cardiac Enzymes: No results found for this basename: CKTOTAL, CKMB, CKMBINDEX, TROPONINI,  in the last 168 hours  BNP (last 3 results) No results found for this basename: PROBNP,  in the last 8760  hours CBG: No results found for this basename: GLUCAP,  in the last 168 hours  Radiological Exams on Admission: Ct Head Wo Contrast  01/16/2014   CLINICAL DATA:  Right eye blurred vision since 6 p.m. tonight.  EXAM: CT HEAD WITHOUT CONTRAST  TECHNIQUE: Contiguous axial images were obtained from the base of the skull through the vertex without intravenous contrast.  COMPARISON:  11/18/2013.  FINDINGS: Diffusely enlarged ventricles and subarachnoid spaces. Patchy white matter low density in both cerebral hemispheres. Suggestion of subtle low density involving the gray and white matter in the left occipital lobe. No intracranial hemorrhage or mass lesion. Unremarkable bones and included paranasal sinuses. Bilateral vertebral and cavernous internal carotid artery atheromatous calcifications.  IMPRESSION: 1. Possible acute left occipital lobe infarct. 2. No intracranial hemorrhage. 3. Stable atrophy and chronic small vessel white matter ischemic changes. These results were called by telephone at the time of interpretation on 01/16/2014 at 9:41 pm to Dr. Pryor Curia , who verbally acknowledged these results.   Electronically Signed   By: Enrique Sack M.D.    On: 01/16/2014 21:43    EKG: Independently reviewed. Paced rhythm on monitor.  Assessment/Plan Principal Problem:   CVA (cerebral infarction) Active Problems:   Hypothyroidism   Essential hypertension   Dyslipidemia   1. CVA - I have discussed with on-call neurologist Dr. Nicole Kindred and since patient has pacemaker MRI will be unable to be done and so CT angiogram of the head and neck has been ordered and patient will be placed on neurochecks.. 2-D echo. Aspirin. Patient will be transferred to [facility] and patient is agreeable to transfer. Dr. Posey Pronto will be the accepting physician. 2. Hypertension - continue Losartan but we'll hold HCTZ since patient will be getting gentle hydration. 3. Hyperglycemia with history of prediabetes - check hemoglobin A1c and patient will be placed on sliding-scale coverage as patient's blood sugar is more than 200. 4. Possible UTI - follow urine cultures. Patient has been placed on empiric antibiotics. 5. Hyperlipidemia - on statins. 6. Hypothyroidism - continue Synthroid. 7. Anemia, chronic - follow CBC. 8. History of sick sinus syndrome status post pacemaker placement.    Code Status: Full code.  Family Communication: Patient's son at the bedside.  Disposition Plan: Admit to inpatient.    KAKRAKANDY,ARSHAD N. [facility] Pager [phone].  If 7PM-7AM, please contact night-coverage [email or web address] Password [email or web address] 01/16/2014, 11:18 PM

## 2014-01-17 ENCOUNTER — Inpatient Hospital Stay (HOSPITAL_COMMUNITY): Payer: MEDICARE

## 2014-01-17 ENCOUNTER — Encounter (HOSPITAL_COMMUNITY): Payer: Self-pay | Admitting: Radiology

## 2014-01-17 DIAGNOSIS — I369 Nonrheumatic tricuspid valve disorder, unspecified: Secondary | ICD-10-CM

## 2014-01-17 LAB — GLUCOSE, CAPILLARY
GLUCOSE-CAPILLARY: 102 mg/dL — AB (ref 70–99)
GLUCOSE-CAPILLARY: 310 mg/dL — AB (ref 70–99)
Glucose-Capillary: 124 mg/dL — ABNORMAL HIGH (ref 70–99)
Glucose-Capillary: 177 mg/dL — ABNORMAL HIGH (ref 70–99)

## 2014-01-17 LAB — COMPREHENSIVE METABOLIC PANEL
ALT: 13 U/L (ref 0–35)
AST: 19 U/L (ref 0–37)
Albumin: 3.3 g/dL — ABNORMAL LOW (ref 3.5–5.2)
Alkaline Phosphatase: 70 U/L (ref 39–117)
Anion gap: 13 (ref 5–15)
BUN: 21 mg/dL (ref 6–23)
CALCIUM: 8.8 mg/dL (ref 8.4–10.5)
CO2: 24 mEq/L (ref 19–32)
Chloride: 103 mEq/L (ref 96–112)
Creatinine, Ser: 0.68 mg/dL (ref 0.50–1.10)
GFR calc Af Amer: 87 mL/min — ABNORMAL LOW (ref >90)
GFR calc non Af Amer: 75 mL/min — ABNORMAL LOW (ref >90)
Glucose, Bld: 102 mg/dL — ABNORMAL HIGH (ref 70–99)
Potassium: 3.9 mEq/L (ref 3.7–5.3)
Sodium: 140 mEq/L (ref 137–147)
TOTAL PROTEIN: 6.3 g/dL (ref 6.0–8.3)
Total Bilirubin: 0.3 mg/dL (ref 0.3–1.2)

## 2014-01-17 LAB — LIPID PANEL
CHOL/HDL RATIO: 2.4 ratio
CHOLESTEROL: 161 mg/dL (ref 0–200)
HDL: 68 mg/dL (ref >39)
LDL Cholesterol: 82 mg/dL (ref 0–99)
TRIGLYCERIDES: 56 mg/dL (ref <150)
VLDL: 11 mg/dL (ref 0–40)

## 2014-01-17 LAB — CBC WITH DIFFERENTIAL/PLATELET
Basophils Absolute: 0 10*3/uL (ref 0.0–0.1)
Basophils Relative: 0 % (ref 0–1)
EOS ABS: 0.2 10*3/uL (ref 0.0–0.7)
EOS PCT: 1 % (ref 0–5)
HCT: 33.5 % — ABNORMAL LOW (ref 36.0–46.0)
Hemoglobin: 11.1 g/dL — ABNORMAL LOW (ref 12.0–15.0)
LYMPHS PCT: 9 % — AB (ref 12–46)
Lymphs Abs: 1 10*3/uL (ref 0.7–4.0)
MCH: 31.3 pg (ref 26.0–34.0)
MCHC: 33.1 g/dL (ref 30.0–36.0)
MCV: 94.4 fL (ref 78.0–100.0)
MONOS PCT: 4 % (ref 3–12)
Monocytes Absolute: 0.5 10*3/uL (ref 0.1–1.0)
Neutro Abs: 10.3 10*3/uL — ABNORMAL HIGH (ref 1.7–7.7)
Neutrophils Relative %: 86 % — ABNORMAL HIGH (ref 43–77)
Platelets: 189 10*3/uL (ref 150–400)
RBC: 3.55 MIL/uL — AB (ref 3.87–5.11)
RDW: 13 % (ref 11.5–15.5)
WBC: 12 10*3/uL — ABNORMAL HIGH (ref 4.0–10.5)

## 2014-01-17 LAB — HEMOGLOBIN A1C
Hgb A1c MFr Bld: 6.3 % — ABNORMAL HIGH (ref <5.7)
Mean Plasma Glucose: 134 mg/dL — ABNORMAL HIGH (ref <117)

## 2014-01-17 LAB — TSH: TSH: 1.44 u[IU]/mL (ref 0.350–4.500)

## 2014-01-17 LAB — C-REACTIVE PROTEIN: CRP: 6.3 mg/dL — AB (ref <0.60)

## 2014-01-17 LAB — SEDIMENTATION RATE: SED RATE: 20 mm/h (ref 0–22)

## 2014-01-17 MED ORDER — IOHEXOL 350 MG/ML SOLN
80.0000 mL | Freq: Once | INTRAVENOUS | Status: AC | PRN
  Administered 2014-01-17: 80 mL via INTRAVENOUS

## 2014-01-17 MED ORDER — ENOXAPARIN SODIUM 40 MG/0.4ML ~~LOC~~ SOLN
40.0000 mg | SUBCUTANEOUS | Status: DC
  Administered 2014-01-17 – 2014-01-21 (×4): 40 mg via SUBCUTANEOUS
  Filled 2014-01-17 (×4): qty 0.4

## 2014-01-17 MED ORDER — VITAMIN B-12 1000 MCG PO TABS
2500.0000 ug | ORAL_TABLET | Freq: Every day | ORAL | Status: DC
  Administered 2014-01-17 – 2014-01-21 (×4): 2500 ug via ORAL
  Filled 2014-01-17 (×5): qty 2.5

## 2014-01-17 MED ORDER — POLYETHYLENE GLYCOL 3350 17 G PO PACK
17.0000 g | PACK | Freq: Every day | ORAL | Status: DC | PRN
  Administered 2014-01-18: 17 g via ORAL
  Filled 2014-01-17 (×2): qty 1

## 2014-01-17 MED ORDER — PREGABALIN 75 MG PO CAPS
75.0000 mg | ORAL_CAPSULE | Freq: Every day | ORAL | Status: DC
  Administered 2014-01-17 – 2014-01-20 (×4): 75 mg via ORAL
  Filled 2014-01-17 (×5): qty 1

## 2014-01-17 MED ORDER — ASPIRIN 300 MG RE SUPP
300.0000 mg | Freq: Every day | RECTAL | Status: DC
Start: 2014-01-17 — End: 2014-01-21
  Filled 2014-01-17: qty 1

## 2014-01-17 MED ORDER — SODIUM CHLORIDE 0.9 % IV SOLN
INTRAVENOUS | Status: AC
  Administered 2014-01-17: 01:00:00 via INTRAVENOUS

## 2014-01-17 MED ORDER — POLYVINYL ALCOHOL 1.4 % OP SOLN
1.0000 [drp] | Freq: Two times a day (BID) | OPHTHALMIC | Status: DC
  Administered 2014-01-17 – 2014-01-21 (×6): 1 [drp] via OPHTHALMIC
  Filled 2014-01-17 (×2): qty 15

## 2014-01-17 MED ORDER — SENNOSIDES-DOCUSATE SODIUM 8.6-50 MG PO TABS
1.0000 | ORAL_TABLET | Freq: Every evening | ORAL | Status: DC | PRN
  Filled 2014-01-17: qty 1

## 2014-01-17 MED ORDER — LOSARTAN POTASSIUM 25 MG PO TABS
25.0000 mg | ORAL_TABLET | Freq: Every day | ORAL | Status: DC
  Administered 2014-01-17: 10:00:00 via ORAL
  Administered 2014-01-18 – 2014-01-21 (×3): 25 mg via ORAL
  Filled 2014-01-17 (×4): qty 1

## 2014-01-17 MED ORDER — LEVOFLOXACIN IN D5W 250 MG/50ML IV SOLN
250.0000 mg | Freq: Every day | INTRAVENOUS | Status: DC
  Administered 2014-01-17 (×2): 250 mg via INTRAVENOUS
  Filled 2014-01-17 (×3): qty 50

## 2014-01-17 MED ORDER — LEVOTHYROXINE SODIUM 125 MCG PO TABS
125.0000 ug | ORAL_TABLET | Freq: Every day | ORAL | Status: DC
  Administered 2014-01-17 – 2014-01-21 (×4): 125 ug via ORAL
  Filled 2014-01-17 (×10): qty 1

## 2014-01-17 MED ORDER — ASPIRIN 325 MG PO TABS: 325.0000 mg | ORAL_TABLET | ORAL | Status: DC

## 2014-01-17 MED ORDER — SIMVASTATIN 40 MG PO TABS
40.0000 mg | ORAL_TABLET | Freq: Every day | ORAL | Status: DC
  Administered 2014-01-17 – 2014-01-21 (×4): 40 mg via ORAL
  Filled 2014-01-17 (×4): qty 1

## 2014-01-17 MED ORDER — HYPROMELLOSE (GONIOSCOPIC) 2.5 % OP SOLN: 1.0000 [drp] | Freq: Two times a day (BID) | OPHTHALMIC | Status: DC

## 2014-01-17 MED ORDER — INSULIN ASPART 100 UNIT/ML ~~LOC~~ SOLN
0.0000 [IU] | Freq: Three times a day (TID) | SUBCUTANEOUS | Status: DC
  Administered 2014-01-17: 1 [IU] via SUBCUTANEOUS
  Administered 2014-01-17: 7 [IU] via SUBCUTANEOUS
  Administered 2014-01-18 – 2014-01-19 (×2): 1 [IU] via SUBCUTANEOUS

## 2014-01-17 MED ORDER — ASPIRIN 325 MG PO TABS
325.0000 mg | ORAL_TABLET | Freq: Every day | ORAL | Status: DC
  Administered 2014-01-17 – 2014-01-21 (×4): 325 mg via ORAL
  Filled 2014-01-17 (×4): qty 1

## 2014-01-17 MED ORDER — STROKE: EARLY STAGES OF RECOVERY BOOK
Freq: Once | Status: AC
  Administered 2014-01-17: 01:00:00
  Filled 2014-01-17: qty 1

## 2014-01-17 NOTE — Evaluation (Addendum)
Physical Therapy Evaluation Patient Details Name: Kayla Davis MRN: [id number] DOB: 08-10-1922 Today's Date: 01/17/2014   History of Present Illness  Kayla Davis is an 80 y.o. female Mr. hypertension, hyperlipidemia, aortic valve disorder and osteoarthritis, who experienced blurring of vision involving right side at about 5:15 PM on 01/16/2014. Symptoms lasted about 15 minutes then resolved. Patient was off aspirin for 2 days in anticipation of steroid injection by orthopedist on 01/17/2014. CT scan of her head showed low density area involving the left occipital region suspicious for acute ischemic stroke. NIH stroke score was 1 for inconsistency with finger counting involving right visual field.  Clinical Impression  Patient reports feeling very weak today during  Mobility. Patient reports desire to return home. Pt. Will benefit from PT while in acute care to address problems listed in chart below. Recommend that patient have 24/7 caregivers and HHPT.    Follow Up Recommendations Home health PT;Supervision/Assistance - 24 hour    Equipment Recommendations  None recommended by PT    Recommendations for Other Services       Precautions / Restrictions Precautions Precautions: Fall Precaution Comments: pt gets  "weak" while walking. Restrictions Weight Bearing Restrictions: No      Mobility  Bed Mobility Overal bed mobility: Modified Independent             General bed mobility comments: needed min A to scoot to [facility] due to IV in R elbow crease causing pt diffuclty using rails  Transfers Overall transfer level: Needs assistance Equipment used: Rolling walker (2 wheeled) Transfers: Sit to/from [facility] Sit to Stand: Min guard Stand pivot transfers: Min guard       General transfer comment: extra time for attempts to stand from bed, supervision from [facility]  Ambulation/Gait Ambulation/Gait assistance: Min assist Ambulation Distance (Feet): 30  Feet Assistive device: Rolling walker (2 wheeled) Gait Pattern/deviations: Step-to pattern;Step-through pattern;Trunk flexed;Shuffle Gait velocity: decreased   General Gait Details: patient   states she feels very fatigued with just  minimal activity. required a rest break after [facility] transfer before able to ambulate.  Stairs            Wheelchair Mobility    Modified Rankin (Stroke Patients Only)       Balance Overall balance assessment: Needs assistance         Standing balance support: During functional activity;No upper extremity supported Standing balance-Leahy Scale: Fair Standing balance comment: does stand and manipulate pants after toileting with supervision                             Pertinent Vitals/Pain Pain Assessment: 0-10 Pain Score: 5  Pain Location: Legs  are painfull with neuropathy Pain Descriptors / Indicators: Aching;Constant;Burning Pain Intervention(s): Monitored during session    Home Living Family/patient expects to be discharged to:: Private residence Living Arrangements: Children Available Help at Discharge: Family Type of Home: House Home Access: Stairs to enter;Ramped entrance;Level entry     Home Layout: One level Home Equipment: [place] - 2 wheels;Cane - quad;Cane - single point;Grab bars - tub/shower;Bedside commode      Prior Function Level of Independence: Independent with assistive device(s)               Hand Dominance   Dominant Hand: Right    Extremity/Trunk Assessment   Upper Extremity Assessment: Generalized weakness           Lower Extremity Assessment: RLE deficits/detail;LLE  deficits/detail;Generalized weakness      Cervical / Trunk Assessment: Normal  Communication   Communication: HOH  Cognition Arousal/Alertness: Awake/alert Behavior During Therapy: WFL for tasks assessed/performed Overall Cognitive Status: Within Functional Limits for tasks assessed                       General Comments      Exercises        Assessment/Plan    PT Assessment Patient needs continued PT services  PT Diagnosis Difficulty walking;Generalized weakness   PT Problem List Decreased strength;Decreased activity tolerance;Decreased mobility;Decreased knowledge of precautions;Decreased safety awareness;Decreased knowledge of use of DME;Pain;Impaired sensation  PT Treatment Interventions DME instruction;Gait training;Functional mobility training;Therapeutic activities;Patient/family education   PT Goals (Current goals can be found in the Care Plan section) Acute Rehab PT Goals Patient Stated Goal: go home PT Goal Formulation: With patient/family Time For Goal Achievement: 01/31/14 Potential to Achieve Goals: Good    Frequency Min 3X/week   Barriers to discharge Decreased caregiver support      Co-evaluation               End of Session   Activity Tolerance: Patient limited by fatigue Patient left: in chair;with call bell/phone within reach;with family/visitor present, chair alarm pad. Nurse Communication: Mobility status         Time: 7741-2878 PT Time Calculation (min): 23 min   Charges:   PT Evaluation $Initial PT Evaluation Tier I: 1 Procedure PT Treatments $Gait Training: 23-37 mins   PT G Codes:          Claretha Cooper 01/17/2014, 4:02 PM Tresa Endo PT [phone]

## 2014-01-17 NOTE — Progress Notes (Signed)
[facility] PROGRESS NOTE  Kayla Davis PYP:[id number] DOB: 09-Jul-1922 DOA: 01/16/2014 PCP: [name], MD  Assessment/Plan: Principal Problem:   CVA (cerebral infarction) Active Problems:   Hypothyroidism   Essential hypertension   Dyslipidemia     probable acute left ischemic PCA territory infarction HgbA1c, fasting lipid panel -LDL 82, triglycerides 56, continue statin 2. PT consult, OT consult pending 3. Echocardiogram , results pending 4. CT angiogram of head and neck with contrast pending 5. Prophylactic therapy-continue Aspirin 325 mg per day  6. Risk factor modification  7. Telemetry monitoring  Hypertension - continue Losartan but we'll hold HCTZ since patient will be getting gentle hydration.   Hyperglycemia with history of prediabetes - check hemoglobin A1c and patient will be placed on sliding-scale coverage as patient's blood sugar is more than 200.   Mild UTI - follow urine cultures. Patient has been placed on empiric antibiotics.   Hyperlipidemia - on statins.   Hypothyroidism - continue Synthroid.   Anemia, chronic - follow CBC.   History of sick sinus syndrome status post pacemaker placement   Code Status: full Family Communication: family updated about patient's clinical progress Disposition Plan:  As above    Brief narrative: Kayla Davis is an 80 y.o. female Mr. hypertension, hyperlipidemia, aortic valve disorder and osteoarthritis, who experienced blurring of vision involving right side at about 5:15 PM on 01/16/2014. Symptoms lasted about 15 minutes then resolved. There's been no recurrence. She has no history of stroke or TIA. Patient was off aspirin for 2 days in anticipation of steroid injection by orthopedist on 01/17/2014. CT scan of her head showed low density area involving the left occipital region suspicious for acute ischemic stroke. NIH stroke score was 1 for inconsistency with finger counting involving right visual  field.   Consultants:  Neurology  Procedures:  None  Antibiotics:  None  HPI/Subjective: Patient is awake alert, denies any blurry vision  Objective: Filed Vitals:   01/17/14 0030 01/17/14 0230 01/17/14 0435 01/17/14 0955  BP: 145/59 152/63 141/55 129/53  Pulse: 64 60 60 86  Temp: 98.1 F (36.7 C) 98.1 F (36.7 C) 97.8 F (36.6 C) 98.1 F (36.7 C)  TempSrc: Oral Oral Oral Oral  Resp: 18 18 18 20   Height: 5\' 4"  (1.626 m)     Weight: 67.586 kg (149 lb)     SpO2: 93% 97% 95% 95%    Intake/Output Summary (Last 24 hours) at 01/17/14 1102 Last data filed at 01/17/14 2458  Gross per 24 hour  Intake    120 ml  Output    600 ml  Net   -480 ml    Exam:  General: alert & oriented x 3 In NAD  Cardiovascular: RRR, nl S1 s2  Respiratory: Decreased breath sounds at the bases, scattered rhonchi, no crackles  Abdomen: soft +BS NT/ND, no masses palpable  Extremities: No cyanosis and no edema      Data Reviewed: Basic Metabolic Panel:  Recent Labs Lab 01/16/14 2021 01/17/14 0632  NA 133* 140  K 4.4 3.9  CL 97 103  CO2 25 24  GLUCOSE 209* 102*  BUN 23 21  CREATININE 0.88 0.68  CALCIUM 9.2 8.8    Liver Function Tests:  Recent Labs Lab 01/17/14 0632  AST 19  ALT 13  ALKPHOS 70  BILITOT 0.3  PROT 6.3  ALBUMIN 3.3*   No results found for this basename: LIPASE, AMYLASE,  in the last 168 hours No results found for this basename:  AMMONIA,  in the last 168 hours  CBC:  Recent Labs Lab 01/16/14 2021 01/17/14 0632  WBC 5.8 12.0*  NEUTROABS 3.3 10.3*  HGB 11.5* 11.1*  HCT 34.8* 33.5*  MCV 95.3 94.4  PLT 234 189    Cardiac Enzymes: No results found for this basename: CKTOTAL, CKMB, CKMBINDEX, TROPONINI,  in the last 168 hours BNP (last 3 results) No results found for this basename: PROBNP,  in the last 8760 hours   CBG:  Recent Labs Lab 01/17/14 0645  GLUCAP 102*    No results found for this or any previous visit (from the past 240  hour(s)).   Studies: Ct Head Wo Contrast  01/16/2014   CLINICAL DATA:  Right eye blurred vision since 6 p.m. tonight.  EXAM: CT HEAD WITHOUT CONTRAST  TECHNIQUE: Contiguous axial images were obtained from the base of the skull through the vertex without intravenous contrast.  COMPARISON:  11/18/2013.  FINDINGS: Diffusely enlarged ventricles and subarachnoid spaces. Patchy white matter low density in both cerebral hemispheres. Suggestion of subtle low density involving the gray and white matter in the left occipital lobe. No intracranial hemorrhage or mass lesion. Unremarkable bones and included paranasal sinuses. Bilateral vertebral and cavernous internal carotid artery atheromatous calcifications.  IMPRESSION: 1. Possible acute left occipital lobe infarct. 2. No intracranial hemorrhage. 3. Stable atrophy and chronic small vessel white matter ischemic changes. These results were called by telephone at the time of interpretation on 01/16/2014 at 9:41 pm to Dr. Pryor Curia , who verbally acknowledged these results.   Electronically Signed   By: Enrique Sack M.D.   On: 01/16/2014 21:43    Scheduled Meds: . aspirin  300 mg Rectal Daily   Or  . aspirin  325 mg Oral Daily  . enoxaparin (LOVENOX) injection  40 mg Subcutaneous Q24H  . insulin aspart  0-9 Units Subcutaneous TID WC  . levofloxacin (LEVAQUIN) IV  250 mg Intravenous QHS  . levothyroxine  125 mcg Oral QAC breakfast  . losartan  25 mg Oral Daily  . polyvinyl alcohol  1 drop Both Eyes BID  . pregabalin  75 mg Oral QHS  . simvastatin  40 mg Oral Daily  . vitamin B-12  2,500 mcg Oral Daily   Continuous Infusions: . sodium chloride 75 mL/hr at 01/17/14 0107    Principal Problem:   CVA (cerebral infarction) Active Problems:   Hypothyroidism   Essential hypertension   Dyslipidemia    Time spent: 40 minutes   [facility] Pager [phone]. If 7PM-7AM, please contact night-coverage at [email or web address], password  [facility] 01/17/2014, 11:02 AM  LOS: 1 day

## 2014-01-17 NOTE — Progress Notes (Signed)
Echocardiogram 2D Echocardiogram has been performed.  Kayla Davis 01/17/2014, 10:37 AM

## 2014-01-17 NOTE — Progress Notes (Signed)
STROKE TEAM PROGRESS NOTE   HISTORY Kayla Davis is an 80 y.o. female Mr. hypertension, hyperlipidemia, aortic valve disorder and osteoarthritis, who experienced blurring of vision involving right side at about 5:15 PM on 01/16/2014. Symptoms lasted about 15 minutes then resolved. There's been no recurrence. She has no history of stroke or TIA. Patient was off aspirin for 2 days in anticipation of steroid injection by orthopedist on 01/17/2014. CT scan of her head showed low density area involving the left occipital region suspicious for acute ischemic stroke. NIH stroke score was 1 for inconsistency with finger counting involving right visual field. Patient was not administered TPA secondary to  Rapid resolution of symptoms. She was admitted for further evaluation and treatment.   SUBJECTIVE (INTERVAL HISTORY) She is currently undergoing a 2D echo. Overall she feels her condition is gradually improving. She had a lot of questions, that were addressed, by Dr. Leonie Man.   OBJECTIVE Temp:  [97.5 F (36.4 C)-98.1 F (36.7 C)] 98.1 F (36.7 C) (10/06 0955) Pulse Rate:  [60-86] 86 (10/06 0955) Cardiac Rhythm:  [-]  Resp:  [16-20] 20 (10/06 0955) BP: (119-152)/(48-63) 129/53 mmHg (10/06 0955) SpO2:  [93 %-99 %] 95 % (10/06 0955) Weight:  [67.586 kg (149 lb)] 67.586 kg (149 lb) (10/06 0030)   Recent Labs Lab 01/17/14 0645  GLUCAP 102*    Recent Labs Lab 01/16/14 2021 01/17/14 0632  NA 133* 140  K 4.4 3.9  CL 97 103  CO2 25 24  GLUCOSE 209* 102*  BUN 23 21  CREATININE 0.88 0.68  CALCIUM 9.2 8.8    Recent Labs Lab 01/17/14 0632  AST 19  ALT 13  ALKPHOS 70  BILITOT 0.3  PROT 6.3  ALBUMIN 3.3*    Recent Labs Lab 01/16/14 2021 01/17/14 0632  WBC 5.8 12.0*  NEUTROABS 3.3 10.3*  HGB 11.5* 11.1*  HCT 34.8* 33.5*  MCV 95.3 94.4  PLT 234 189   No results found for this basename: CKTOTAL, CKMB, CKMBINDEX, TROPONINI,  in the last 168 hours No results found for this  basename: LABPROT, INR,  in the last 72 hours  Recent Labs  01/16/14 2044  COLORURINE YELLOW  LABSPEC 1.014  PHURINE 6.0  GLUCOSEU NEGATIVE  HGBUR TRACE*  BILIRUBINUR NEGATIVE  KETONESUR NEGATIVE  PROTEINUR NEGATIVE  UROBILINOGEN 0.2  NITRITE NEGATIVE  LEUKOCYTESUR SMALL*       Component Value Date/Time   CHOL 161 01/17/2014 0632   TRIG 56 01/17/2014 0632   HDL 68 01/17/2014 0632   CHOLHDL 2.4 01/17/2014 0632   VLDL 11 01/17/2014 0632   LDLCALC 82 01/17/2014 0632   Lab Results  Component Value Date   HGBA1C 6.2 08/12/2013      Component Value Date/Time   LABOPIA NEG 11/16/2013 1204   COCAINSCRNUR NEG 11/16/2013 1204   LABBENZ NEG 11/16/2013 1204   AMPHETMU NEG 11/16/2013 1204    No results found for this basename: ETH,  in the last 168 hours  Ct Head Wo Contrast  01/16/2014   CLINICAL DATA:  Right eye blurred vision since 6 p.m. tonight.  EXAM: CT HEAD WITHOUT CONTRAST  TECHNIQUE: Contiguous axial images were obtained from the base of the skull through the vertex without intravenous contrast.  COMPARISON:  11/18/2013.  FINDINGS: Diffusely enlarged ventricles and subarachnoid spaces. Patchy white matter low density in both cerebral hemispheres. Suggestion of subtle low density involving the gray and white matter in the left occipital lobe. No intracranial hemorrhage or mass lesion. Unremarkable bones  and included paranasal sinuses. Bilateral vertebral and cavernous internal carotid artery atheromatous calcifications.  IMPRESSION: 1. Possible acute left occipital lobe infarct. 2. No intracranial hemorrhage. 3. Stable atrophy and chronic small vessel white matter ischemic changes. These results were called by telephone at the time of interpretation on 01/16/2014 at 9:41 pm to Dr. Pryor Curia , who verbally acknowledged these results.   Electronically Signed   By: Enrique Sack M.D.   On: 01/16/2014 21:43    PHYSICAL EXAM Pleasant elderly Caucasian lady not in distress.Awake alert. Afebrile.  Head is nontraumatic. Neck is supple without bruit. Hearing is normal. Cardiac exam no murmur or gallop. Lungs are clear to auscultation. Distal pulses are well felt. Neurological Exam :    Awake  Alert oriented x 3. Normal speech and language.eye movements full without nystagmus.fundi were not visualized. Vision acuity and fields appear normal. Hearing is normal. Palatal movements are normal. Face symmetric. Tongue midline. Normal strength, tone, reflexes and coordination. Normal sensation. Gait deferred. ASSESSMENT/PLAN [name] is a 80 y.o. female with history of hypertension, hyperlipidemia, aortic valve disorder and osteoarthritis, presenting with right side blurry vision (could not confirm if unlateral or bilateral). She did not receive IV t-PA due to quick resolution of symptoms. Initial CT imaging confirms unremarkable.  Blurred vision, etiology unclear. Rule out Stroke, ? temporal arteritis, ? syncope  MRI /MRA  Unable due to pacer  CT angio head and neck pending  2D Echo  pending  Lovenox 40 mg sq daily for VTE prophylaxis  Cardiac thin liquids.   OOB  with assistance  Resultant blurry vision which is resolving   aspirin 325 mg orally every day prior to admission, now on aspirin 325 mg orally every day  Risk factor education  Ongoing aggressive risk factor management  Check ESR and CRP  Therapy recommendations:  Home health PT, no OT  Disposition:  Home with home health PT  Hypertension  Home meds:   Losartan and hydrochlorothiazide, hydrochlorothiazide a full in-hospital for gentle hydration BP 119-152/48-63 past 24h (01/17/2014 @ 10:57 AM)  Stable  Diabetes, history of prediabetes  HgbA1c 6.3, at goal < 7.0  Hyperlipidemia  Home meds:  Zocor 40 mg daily, resumed in hospital  LDL 82  Continue statin  Continue statin at discharge  Other Stroke Risk Factors Advanced age  Other Active Problems  Possible UTI, placed on Antibiotics. Urine  culture pending  Hyperthyroidism  Chronic anemia  Sick sinus syndrome status post pacemaker  Hospital day # 1  Kayla BIBY, MSN, RN, ANVP-BC, ANP-BC, [facility] Pager: [phone] 01/17/2014 4:46 PM  I have personally examined this patient, reviewed notes, independently viewed imaging studies, participated in medical decision making and plan of care. I have made any additions or clarifications directly to the above note. Agree with note above.   Antony Contras, MD Medical Director [facility] Pager: [phone] 01/17/2014 8:13 PM    To contact Stroke Continuity provider, please refer to [email or web address] After hours, contact General Neurology

## 2014-01-17 NOTE — Consult Note (Signed)
Referring Physician: Posey Pronto, P    Chief Complaint: Transient blurring of vision involving right side.  HPI: Kayla Davis is an 80 y.o. female Mr. hypertension, hyperlipidemia, aortic valve disorder and osteoarthritis, who experienced blurring of vision involving right side at about 5:15 PM on 01/16/2014. Symptoms lasted about 15 minutes then resolved. There's been no recurrence. She has no history of stroke or TIA. Patient was off aspirin for 2 days in anticipation of steroid injection by orthopedist on 01/17/2014. CT scan of her head showed low density area involving the left occipital region suspicious for acute ischemic stroke. NIH stroke score was 1 for inconsistency with finger counting involving right visual field.  LSN: 5:15 on 01/16/2014 tPA Given: No: Rapid resolution of symptoms mRankin:  Past Medical History  Diagnosis Date  . Hypertension   . Hyperlipidemia   . Aortic valve disorders   . Unspecified hereditary and idiopathic peripheral neuropathy   . Other abnormal glucose   . Unspecified hearing loss   . Unspecified chronic bronchitis   . Unspecified hypothyroidism   . Unspecified gastritis and gastroduodenitis without mention of hemorrhage   . Diverticulosis of colon (without mention of hemorrhage)   . Irritable bowel syndrome   . Osteoarthrosis, unspecified whether generalized or localized, unspecified site   . Backache, unspecified   . Pain in limb   . Osteoporosis, unspecified   . Herpes zoster with other nervous system complications(053.19)   . Anxiety state, unspecified   . Anemia, unspecified   . Bradycardia     s/p PPM    Family History  Problem Relation Age of Onset  . Heart disease Father   . Cancer Paternal Grandmother   . Diabetes Father   . Heart disease Mother      Medications: I have reviewed the patient's current medications.  ROS: History obtained from the patient  General ROS: negative for - chills, fatigue, fever, night sweats,  weight gain or weight loss Psychological ROS: negative for - behavioral disorder, hallucinations, memory difficulties, mood swings or suicidal ideation Ophthalmic ROS: negative for - blurry vision, double vision, eye pain or loss of vision ENT ROS: negative for - epistaxis, nasal discharge, oral lesions, sore throat, tinnitus or vertigo Allergy and Immunology ROS: negative for - hives or itchy/watery eyes Hematological and Lymphatic ROS: negative for - bleeding problems, bruising or swollen lymph nodes Endocrine ROS: negative for - galactorrhea, hair pattern changes, polydipsia/polyuria or temperature intolerance Respiratory ROS: negative for - cough, hemoptysis, shortness of breath or wheezing Cardiovascular ROS: negative for - chest pain, dyspnea on exertion, edema or irregular heartbeat Gastrointestinal ROS: negative for - abdominal pain, diarrhea, hematemesis, nausea/vomiting or stool incontinence Genito-Urinary ROS: negative for - dysuria, hematuria, incontinence or urinary frequency/urgency Musculoskeletal ROS: Chronic arthritis with pain involving left lower extremity Neurological ROS: as noted in HPI Dermatological ROS: negative for rash and skin lesion changes  Physical Examination: Blood pressure 145/59, pulse 64, temperature 98.1 F (36.7 C), temperature source Oral, resp. rate 18, SpO2 93.00%.  Neurologic Examination: Mental Status: Alert, oriented, thought content appropriate.  Speech fluent without evidence of aphasia. Able to follow commands without difficulty. Cranial Nerves: II-equivocal right visual field defect. III/IV/VI-Pupils were equal and reacted. Extraocular movements were full and conjugate.    V/VII-no facial numbness and no facial weakness. VIII-normal. X-normal speech and symmetrical palatal movement. Motor: 5/5 bilaterally with normal tone and bulk Sensory: Normal throughout except reduced laboratory sensation distally in both lower extremities. Deep Tendon  Reflexes: 1+ upper extremities  and absent at knees and ankles and symmetric. Plantars: Mute bilaterally Cerebellar: Normal finger-to-nose testing.  Ct Head Wo Contrast  01/16/2014   CLINICAL DATA:  Right eye blurred vision since 6 p.m. tonight.  EXAM: CT HEAD WITHOUT CONTRAST  TECHNIQUE: Contiguous axial images were obtained from the base of the skull through the vertex without intravenous contrast.  COMPARISON:  11/18/2013.  FINDINGS: Diffusely enlarged ventricles and subarachnoid spaces. Patchy white matter low density in both cerebral hemispheres. Suggestion of subtle low density involving the gray and white matter in the left occipital lobe. No intracranial hemorrhage or mass lesion. Unremarkable bones and included paranasal sinuses. Bilateral vertebral and cavernous internal carotid artery atheromatous calcifications.  IMPRESSION: 1. Possible acute left occipital lobe infarct. 2. No intracranial hemorrhage. 3. Stable atrophy and chronic small vessel white matter ischemic changes. These results were called by telephone at the time of interpretation on 01/16/2014 at 9:41 pm to Dr. Pryor Curia , who verbally acknowledged these results.   Electronically Signed   By: Enrique Sack M.D.   On: 01/16/2014 21:43    Assessment: 80 y.o. female presenting with probable acute left ischemic PCA territory infarction.  Stroke Risk Factors - family history, hyperlipidemia and hypertension  Plan: 1. HgbA1c, fasting lipid panel 2. PT consult, OT consult 3. Echocardiogram 4. CT angiogram of head and neck with contrast 5. Prophylactic therapy-Antiplatelet med: Aspirin 325 mg per day 6. Risk factor modification 7. Telemetry monitoring   C.R. Nicole Kindred, MD [facility] [phone]  01/17/2014, 12:52 AM

## 2014-01-17 NOTE — Evaluation (Signed)
Occupational Therapy Evaluation Patient Details Name: Kayla Davis MRN: [id number] DOB: 1922-05-29 Today's Date: 01/17/2014    History of Present Illness Kayla Davis is an 80 y.o. female Mr. hypertension, hyperlipidemia, aortic valve disorder and osteoarthritis, who experienced blurring of vision involving right side at about 5:15 PM on 01/16/2014. Symptoms lasted about 15 minutes then resolved. There's been no recurrence. She has no history of stroke or TIA. Patient was off aspirin for 2 days in anticipation of steroid injection by orthopedist on 01/17/2014. CT scan of her head showed low density area involving the left occipital region suspicious for acute ischemic stroke. NIH stroke score was 1 for inconsistency with finger counting involving right visual field.   Clinical Impression   Pt is at set up/sup - Mod I level wit ADLs and ADL mobility. Pt states that her blurred vision has resolved and was able to correctly identify items and number of items from 20 feet away during eval. All education completed and no further acute OT services are indicated at this time    Follow Up Recommendations  No OT follow up;Supervision - Intermittent    Equipment Recommendations  None recommended by OT    Recommendations for Other Services       Precautions / Restrictions Precautions Precautions: None Restrictions Weight Bearing Restrictions: No      Mobility Bed Mobility Overal bed mobility: Modified Independent             General bed mobility comments: needed min A to scoot to [facility] due to IV in R elbow crease causing pt diffuclty using rails  Transfers   Equipment used: Rolling walker (2 wheeled) Transfers: Sit to/from Stand Sit to Stand: Supervision;Modified independent (Device/Increase time)              Balance Overall balance assessment: No apparent balance deficits (not formally assessed)                                          ADL  Overall ADL's : Needs assistance/impaired     Grooming: Wash/dry hands;Wash/dry face;Standing;Supervision/safety;Modified independent   Upper Body Bathing: Set up;Sitting   Lower Body Bathing: Set up;Sitting/lateral leans;Sit to/from stand   Upper Body Dressing : Set up;Sitting   Lower Body Dressing: Minimal assistance Lower Body Dressing Details (indicate cue type and reason): difficulty with socks due to nueropathy pain in legs Toilet Transfer: Modified Independent;Supervision/safety;RW;BSC   Toileting- Clothing Manipulation and Hygiene: Supervision/safety;Sit to/from stand       Functional mobility during ADLs: Supervision/safety;Modified independent       Vision  wears glasses at all times               Additional Comments: admited due to R eye blurry vision, however pt states that her vision no longer blurry. Able to correctly identify items and number of items from 20 feet away   Perception Perception Perception Tested?: No   Praxis Praxis Praxis tested?: Not tested    Pertinent Vitals/Pain Pain Assessment: 0-10 Pain Score: 5  Pain Location: L LE, pt states due to her neuropathy Pain Intervention(s): Monitored during session     Hand Dominance Right   Extremity/Trunk Assessment Upper Extremity Assessment Upper Extremity Assessment: Generalized weakness   Lower Extremity Assessment Lower Extremity Assessment: Defer to PT evaluation   Cervical / Trunk Assessment Cervical / Trunk Assessment: Normal   Communication Communication Communication:  HOH   Cognition Arousal/Alertness: Awake/alert Behavior During Therapy: WFL for tasks assessed/performed Overall Cognitive Status: Within Functional Limits for tasks assessed                                        Home Living Family/patient expects to be discharged to:: Private residence Living Arrangements: Children Available Help at Discharge: Family (pt reports son stays with her  nightly) Type of Home: House Home Access: Stairs to enter;Ramped entrance;Level entry   Entrance Stairs-Rails: Left;Right Home Layout: One level     Bathroom Shower/Tub: Teacher, early years/pre: Standard     Home Equipment: Environmental consultant - 2 wheels;Cane - quad;Cane - single point;Grab bars - tub/shower;Bedside commode          Prior Functioning/Environment Level of Independence: Independent with assistive device(s)             OT Diagnosis: Generalized weakness   OT Problem List:     OT Treatment/Interventions:      OT Goals(Current goals can be found in the care plan section) Acute Rehab OT Goals Patient Stated Goal: go home OT Goal Formulation: With patient  OT Frequency:     Barriers to D/C:  none          Co-evaluation              End of Session Equipment Utilized During Treatment: Rolling walker;Other (comment) (BSC)  Activity Tolerance: Patient tolerated treatment well Patient left: in bed;with call bell/phone within reach;Other (comment) (with SLP)   Time: 4174-0814 OT Time Calculation (min): 34 min Charges:  OT General Charges $OT Visit: 1 Procedure OT Evaluation $Initial OT Evaluation Tier I: 1 Procedure OT Treatments $Self Care/Home Management : 8-22 mins $Therapeutic Activity: 8-22 mins G-Codes:    Britt Bottom 01/17/2014, 12:23 PM

## 2014-01-17 NOTE — Progress Notes (Signed)
CARE MANAGEMENT NOTE 01/17/2014  Patient:  Kayla Davis, Kayla Davis   Account Number:  [id number]  Date Initiated:  01/17/2014  Documentation initiated by:  Olga Coaster  Subjective/Objective Assessment:   ADMITTED WITH STROKE     Action/Plan:   CM FOLLOWING FOR DCP   Anticipated DC Date:  01/20/2014   Anticipated DC Plan:  AWAITING FOR PT/OT EVALS FOR DISPOSITION NEEDS     DC Planning Services  CM consult          Status of service:  In process, will continue to follow Medicare Important Message given?   (If response is "NO", the following Medicare IM given date fields will be blank)  Per UR Regulation:  Reviewed for med. necessity/level of care/duration of stay  Comments:  10/6/2015Mindi Slicker RN,BSN,MHA [phone]

## 2014-01-17 NOTE — Evaluation (Signed)
Speech Language Pathology Evaluation Patient Details Name: Kayla Davis MRN: [id number] DOB: [birth year] Today's Date: 01/17/2014 Time: 7829-5621 SLP Time Calculation (min): 26 min  Problem List:  Patient Active Problem List   Diagnosis Date Noted  . CVA (cerebral infarction) 01/16/2014  . Dyslipidemia 01/16/2014  . Weakness generalized 04/30/2013  . CAP (community acquired pneumonia) 04/29/2013  . Edema 03/22/2013  . Urinary incontinence in female 03/22/2013  . Unspecified constipation 10/01/2012  . UTI (urinary tract infection) 10/01/2012  . Diarrhea 08/26/2012  . Cerumen impaction 12/11/2011  . Pneumonia 11/25/2010  . Pacemaker-Medtronic 07/30/2010  . SINUS BRADYCARDIA 05/17/2010  . DYSURIA 01/29/2010  . Aortic valve disorders 07/31/2009  . VENOUS INSUFFICIENCY 07/31/2009  . HYPONATREMIA 07/01/2009  . LEG CRAMPS 06/12/2009  . LEG PAIN, BILATERAL 02/01/2009  . BACK PAIN 08/24/2008  . ANEMIA, MILD 06/21/2008  . DEGENERATIVE JOINT DISEASE 02/19/2008  . CHEST WALL PAIN, HX OF 02/19/2008  . IRRITABLE BOWEL SYNDROME 12/28/2007  . DIABETES MELLITUS, BORDERLINE 11/06/2007  . GASTRITIS 07/08/2007  . POSTHERPETIC NEURALGIA 07/01/2007  . Herpes Zoster without Mention of Complication 30/86/5784  . Hypothyroidism 06/08/2007  . HYPERLIPIDEMIA 06/08/2007  . ANXIETY 06/08/2007  . PERIPHERAL NEUROPATHY 06/08/2007  . HEARING LOSS 06/08/2007  . Essential hypertension 06/08/2007  . TRANSIENT ISCHEMIC ATTACK 06/08/2007  . BRONCHITIS, RECURRENT 06/08/2007  . DIVERTICULOSIS OF COLON 06/08/2007  . OSTEOPOROSIS 06/08/2007   Past Medical History:  Past Medical History  Diagnosis Date  . Hypertension   . Hyperlipidemia   . Aortic valve disorders   . Unspecified hereditary and idiopathic peripheral neuropathy   . Other abnormal glucose   . Unspecified hearing loss   . Unspecified chronic bronchitis   . Unspecified hypothyroidism   . Unspecified gastritis and gastroduodenitis  without mention of hemorrhage   . Diverticulosis of colon (without mention of hemorrhage)   . Irritable bowel syndrome   . Osteoarthrosis, unspecified whether generalized or localized, unspecified site   . Backache, unspecified   . Pain in limb   . Osteoporosis, unspecified   . Herpes zoster with other nervous system complications(053.19)   . Anxiety state, unspecified   . Anemia, unspecified   . Bradycardia     s/p PPM   Past Surgical History:  Past Surgical History  Procedure Laterality Date  . Pacemaker insertion      MDT  . Appendectomy    . Cholecystectomy    . Tonsillectomy    . Vaginal hysterectomy    . Cataract extraction, bilateral  end of June 2013  . Cataract extraction, bilateral  beginning of July 2013   HPI:  80 yo female adm to [facility] with vision difficuties - blurred vision on right side.  Pt found to have possible left occipital lobe.  Speech language evaluation ordered.     Assessment / Plan / Recommendation Clinical Impression  Pt presents with functional cognitive linguistic abilities with fluent speech/language.   She does present with decreased left facial sensation of all 3 branches of trigeminal nerve.  Pt able to name 16 animals in 60 seconds.   Recalled all of her medications without cues.  Pt able to write her home address with intact legibility and semantics.  High level problem solving skills demonstrated by pt stating she requested to have testing completed while in hospital that she was originally scheduled for today.    SLP educated pt to request to have her son assure she is managing her home duties including medications, appointments and bills; pt agreeable.  SLP to sign off as all education completed.     SLP Assessment  Patient does not need any further Speech Lanaguage Pathology Services    Follow Up Recommendations  None    Frequency and Duration   n/a     Pertinent Vitals/Pain Pain Assessment: No/denies pain (leg pain-chronic)   SLP  Goals  Patient/Family Stated Goal: to get testing done on leg and CAT scan  SLP Evaluation Prior Functioning  Type of Home: House Available Help at Discharge: Family (pt reports son stays with her nightly)   Cognition  Overall Cognitive Status: Within Functional Limits for tasks assessed Arousal/Alertness: Awake/alert Orientation Level: Oriented X4 Attention: Focused;Sustained Focused Attention: Appears intact Sustained Attention: Appears intact Memory: Appears intact Awareness: Appears intact Problem Solving: Appears intact (pt demonstrated good problem solving by asking for testing scheduled as outpt to be completed while in hospital) Safety/Judgment: Appears intact    Comprehension  Auditory Comprehension Overall Auditory Comprehension: Appears within functional limits for tasks assessed Yes/No Questions: Not tested Commands: Within Functional Limits Conversation: Complex Interfering Components: Chief Financial Officer Discrimination: Not tested Reading Comprehension Reading Status: Within funtional limits    Expression Expression Primary Mode of Expression: Verbal Verbal Expression Overall Verbal Expression: Appears within functional limits for tasks assessed Initiation: No impairment Level of Generative/Spontaneous Verbalization: Conversation Naming: Not tested Pragmatics: No impairment Other Verbal Expression Comments: pt able to name 16 animals in 60 seconds Written Expression Dominant Hand: Right Written Expression: Within Functional Limits   Oral / Motor Oral Motor/Sensory Function Overall Oral Motor/Sensory Function: Appears within functional limits for tasks assessed (except decreased facial sensation on left per pt) Motor Speech Overall Motor Speech: Appears within functional limits for tasks assessed Respiration: Within functional limits Resonance: Within functional limits Articulation: Within functional limitis Intelligibility:  Intelligible Motor Planning: Witnin functional limits Motor Speech Errors: Not applicable   [place], [name] SLP [phone]

## 2014-01-18 DIAGNOSIS — I6521 Occlusion and stenosis of right carotid artery: Secondary | ICD-10-CM

## 2014-01-18 DIAGNOSIS — Z01810 Encounter for preprocedural cardiovascular examination: Secondary | ICD-10-CM

## 2014-01-18 DIAGNOSIS — I63239 Cerebral infarction due to unspecified occlusion or stenosis of unspecified carotid arteries: Secondary | ICD-10-CM

## 2014-01-18 DIAGNOSIS — I639 Cerebral infarction, unspecified: Secondary | ICD-10-CM

## 2014-01-18 LAB — GLUCOSE, CAPILLARY
Glucose-Capillary: 115 mg/dL — ABNORMAL HIGH (ref 70–99)
Glucose-Capillary: 78 mg/dL (ref 70–99)
Glucose-Capillary: 126 mg/dL — ABNORMAL HIGH (ref 70–99)
Glucose-Capillary: 156 mg/dL — ABNORMAL HIGH (ref 70–99)

## 2014-01-18 LAB — URINE CULTURE: Colony Count: >=100000

## 2014-01-18 MED ORDER — LEVOFLOXACIN 250 MG PO TABS
250.0000 mg | ORAL_TABLET | Freq: Every day | ORAL | Status: DC
  Administered 2014-01-18 – 2014-01-20 (×3): 250 mg via ORAL
  Filled 2014-01-18 (×5): qty 1

## 2014-01-18 MED ORDER — LEVOFLOXACIN 250 MG PO TABS
250.0000 mg | ORAL_TABLET | Freq: Every day | ORAL | Status: DC
Start: 2014-01-18 — End: 2014-01-21

## 2014-01-18 NOTE — Progress Notes (Signed)
PT Cancellation Note  Patient Details Name: QUIERA DIFFEE MRN: [id number] DOB: [birth year]   Cancelled Treatment:    Reason Eval/Treat Not Completed: Patient at procedure or test/unavailable.  Pt is away at a test.  PT will check back later today or tomorrow as time allows.   Thanks,    Barbarann Ehlers. Caliope Ruppert, PT, DPT [phone]   01/18/2014, 3:43 PM

## 2014-01-18 NOTE — Progress Notes (Addendum)
VASCULAR LAB PRELIMINARY  PRELIMINARY  PRELIMINARY  PRELIMINARY  Carotid duplex completed.    Preliminary report:   Right:  40-59% internal carotid artery stenosis. Left - 1% to 39% ICA stenosis. Bilateral -    Vertebral artery flow is antegrade.  Preliminary results called to Dr. Tinnie Gens M.D.   Hogan Hoobler, RVS 01/18/2014, 4:14 PM

## 2014-01-18 NOTE — Progress Notes (Signed)
STROKE TEAM PROGRESS NOTE   HISTORY Kayla Davis is an 80 y.o. female Mr. hypertension, hyperlipidemia, aortic valve disorder and osteoarthritis, who experienced blurring of vision involving right side at about 5:15 PM on 01/16/2014. Symptoms lasted about 15 minutes then resolved. There's been no recurrence. She has no history of stroke or TIA. Patient was off aspirin for 2 days in anticipation of steroid injection by orthopedist on 01/17/2014. CT scan of her head showed low density area involving the left occipital region suspicious for acute ischemic stroke. NIH stroke score was 1 for inconsistency with finger counting involving right visual field. Patient was not administered TPA secondary to  Rapid resolution of symptoms. She was admitted for further evaluation and treatment.   SUBJECTIVE (INTERVAL HISTORY) She had Ct angio showing 80% proximal RICA stenosis which is likely symptomatic. Overall she feels her condition is gradually improving. She had a lot of questions, that were addressed, by Dr. Leonie Man.   OBJECTIVE Temp:  [97.7 F (36.5 C)-99.1 F (37.3 C)] 98.2 F (36.8 C) (10/07 1016) Pulse Rate:  [65-90] 69 (10/07 1016) Cardiac Rhythm:  [-] Ventricular paced (10/07 0800) Resp:  [18-20] 18 (10/07 1016) BP: (111-147)/(44-95) 147/95 mmHg (10/07 1016) SpO2:  [92 %-97 %] 96 % (10/07 1016)   Recent Labs Lab 01/17/14 1210 01/17/14 1635 01/17/14 2207 01/18/14 0701 01/18/14 1225  GLUCAP 310* 124* 177* 115* 78    Recent Labs Lab 01/16/14 2021 01/17/14 0632  NA 133* 140  K 4.4 3.9  CL 97 103  CO2 25 24  GLUCOSE 209* 102*  BUN 23 21  CREATININE 0.88 0.68  CALCIUM 9.2 8.8    Recent Labs Lab 01/17/14 0632  AST 19  ALT 13  ALKPHOS 70  BILITOT 0.3  PROT 6.3  ALBUMIN 3.3*    Recent Labs Lab 01/16/14 2021 01/17/14 0632  WBC 5.8 12.0*  NEUTROABS 3.3 10.3*  HGB 11.5* 11.1*  HCT 34.8* 33.5*  MCV 95.3 94.4  PLT 234 189   No results found for this basename:  CKTOTAL, CKMB, CKMBINDEX, TROPONINI,  in the last 168 hours No results found for this basename: LABPROT, INR,  in the last 72 hours  Recent Labs  01/16/14 2044  COLORURINE YELLOW  LABSPEC 1.014  PHURINE 6.0  GLUCOSEU NEGATIVE  HGBUR TRACE*  BILIRUBINUR NEGATIVE  KETONESUR NEGATIVE  PROTEINUR NEGATIVE  UROBILINOGEN 0.2  NITRITE NEGATIVE  LEUKOCYTESUR SMALL*       Component Value Date/Time   CHOL 161 01/17/2014 0632   TRIG 56 01/17/2014 0632   HDL 68 01/17/2014 0632   CHOLHDL 2.4 01/17/2014 0632   VLDL 11 01/17/2014 0632   LDLCALC 82 01/17/2014 0632   Lab Results  Component Value Date   HGBA1C 6.3* 01/17/2014      Component Value Date/Time   LABOPIA NEG 11/16/2013 1204   COCAINSCRNUR NEG 11/16/2013 1204   LABBENZ NEG 11/16/2013 1204   AMPHETMU NEG 11/16/2013 1204    No results found for this basename: ETH,  in the last 168 hours  Ct Angio Head W/cm &/or Wo Cm  01/17/2014   CLINICAL DATA:  80 year old female with right eye blurred vision of acute onset. Initial encounter. Possible acute left occiput infarct on non contrast head CT done 01/16/2014. Personal history of TIA. Initial encounter.  EXAM: CT ANGIOGRAPHY HEAD AND NECK  TECHNIQUE: Multidetector CT imaging of the head and neck was performed using the standard protocol during bolus administration of intravenous contrast. Multiplanar CT image reconstructions and MIPs were  obtained to evaluate the vascular anatomy. Carotid stenosis measurements (when applicable) are obtained utilizing NASCET criteria, using the distal internal carotid diameter as the denominator.  CONTRAST:  37m OMNIPAQUE IOHEXOL 350 MG/ML SOLN  COMPARISON:  Head CTs without contrast 01/16/2014 and earlier.  FINDINGS: CTA HEAD FINDINGS  Visualized paranasal sinuses and mastoids are clear. No acute osseous abnormality identified. No acute scalp soft tissue findings.  No evidence of cortically based acute infarction identified. Gray-white matter differentiation is within  normal limits throughout the brain. No abnormal enhancement identified. No intracranial mass effect. No ventriculomegaly.  VASCULAR FINDINGS:  Major intracranial venous structures are enhancing.  Patent distal vertebral arteries with mild calcified plaque. Patent bilateral PICA origins. Patent vertebrobasilar junction. Mildly dolichoectatic basilar artery with calcified plaque but no stenosis. SCA and PCA origins are within normal limits. Posterior communicating arteries are diminutive or absent. Bilateral PCA branches are within normal limits.  Calcified plaque in both ICA siphons resulting in less than 50 % stenosis with respect to the distal vessel. Normal ophthalmic artery origins. Patent carotid termini. MCA and ACA origins are within normal limits.  Diminutive or absent anterior communicating artery. Bilateral ACA branches are within normal limits. Small infundibula of the right anterior temporal arteries arising from the right M1 segment. Right MCA branches are within normal limits. Left MCA branches are within normal limits.  Review of the MIP images confirms the above findings.  CTA NECK FINDINGS  Negative lung apices. Right chest pacemaker generator partially visible. No superior mediastinal lymphadenopathy. Negative thyroid, larynx, pharynx, parapharyngeal spaces, retropharyngeal space, sublingual space, submandibular glands, parotid glands. Postoperative changes to the globes. Visualized paranasal sinuses and mastoids are clear. Severe degenerative changes at the left TMJ. Multilevel degenerative changes in the cervical spine. No acute osseous abnormality identified. No cervical lymphadenopathy.  VASCULAR FINDINGS:  Three vessel arch configuration. Calcified arch atherosclerosis, mostly affecting the left subclavian artery.  Right CCA origin within normal limits. Short segment of the proximal right CCA is mildly obscured by dense venous contrast. Retropharyngeal course of the right CCA. Mild calcified  plaque proximal to the bifurcation. At the right ICA bifurcation calcified plaque results in up to 80 % stenosis with respect to the distal vessel (sagittal series 8, image 77). Elsewhere, the cervical right ICA is normal.  Calcified plaque at the right subclavian artery origin not resulting in hemodynamically significant stenosis. Mild calcified plaque at the right vertebral artery origin without significant stenosis. Intermittently tortuous but otherwise negative cervical right vertebral artery.  Mild calcified plaque left CCA origin without stenosis. Partially retropharyngeal course of the left CCA. Mild calcified plaque proximal to the left carotid bifurcation. Dense calcified plaque at the left carotid bifurcation resulting in up to 60 % stenosis with respect to the distal vessel (coronal series 7, image 157). Tortuous left ICA just beyond the bulb with a mildly kinked appearance, otherwise negative to the skullbase.  Calcified plaque left proximal subclavian artery without significant stenosis. Calcified plaque at the left vertebral artery origin without significant stenosis. Tortuous proximal left vertebral. Tortuous distal left vertebral, otherwise negative to the skullbase.  Review of the MIP images confirms the above findings.  IMPRESSION: 1. Dense bilateral carotid bifurcation calcified plaque, on the right resulting in high-grade stenosis of approximately 80% with respect to the distal vessel. 60% left ICA origin stenosis. 2. No vertebral artery stenosis. Negative for age intracranial circulation except for Mild dolichoectasia. 3. Negative CT appearance of the brain, no CT evidence of acute or subacute cortically  based infarct.   Electronically Signed   By: Lars Pinks M.D.   On: 01/17/2014 16:21   Dg Chest 2 View  01/17/2014   CLINICAL DATA:  Stroke yesterday, personal history of pacemaker insertion, aortic valve disorder, hypertension, hypothyroidism, hyperlipidemia  EXAM: CHEST  2 VIEW  COMPARISON:   11/18/2013  FINDINGS: RIGHT subclavian transvenous pacemaker leads project at RIGHT atrium and RIGHT ventricle.  Enlargement of cardiac silhouette with pulmonary vascular congestion.  Calcified tortuous thoracic aorta.  Bibasilar atelectasis.  No gross failure or consolidation.  Mild bronchitic changes noted.  No pleural effusion or pneumothorax.  Multilevel endplate spur formation thoracic spine and diffuse osseous demineralization.  IMPRESSION: Enlargement of cardiac silhouette with pulmonary vascular congestion.  Bronchitic changes with bibasilar atelectasis.   Electronically Signed   By: Lavonia Dana M.D.   On: 01/17/2014 17:05   Ct Head Wo Contrast  01/16/2014   CLINICAL DATA:  Right eye blurred vision since 6 p.m. tonight.  EXAM: CT HEAD WITHOUT CONTRAST  TECHNIQUE: Contiguous axial images were obtained from the base of the skull through the vertex without intravenous contrast.  COMPARISON:  11/18/2013.  FINDINGS: Diffusely enlarged ventricles and subarachnoid spaces. Patchy white matter low density in both cerebral hemispheres. Suggestion of subtle low density involving the gray and white matter in the left occipital lobe. No intracranial hemorrhage or mass lesion. Unremarkable bones and included paranasal sinuses. Bilateral vertebral and cavernous internal carotid artery atheromatous calcifications.  IMPRESSION: 1. Possible acute left occipital lobe infarct. 2. No intracranial hemorrhage. 3. Stable atrophy and chronic small vessel white matter ischemic changes. These results were called by telephone at the time of interpretation on 01/16/2014 at 9:41 pm to Dr. Pryor Curia , who verbally acknowledged these results.   Electronically Signed   By: Enrique Sack M.D.   On: 01/16/2014 21:43   Ct Angio Neck W/cm &/or Wo/cm  01/17/2014   CLINICAL DATA:  80 year old female with right eye blurred vision of acute onset. Initial encounter. Possible acute left occiput infarct on non contrast head CT done 01/16/2014.  Personal history of TIA. Initial encounter.  EXAM: CT ANGIOGRAPHY HEAD AND NECK  TECHNIQUE: Multidetector CT imaging of the head and neck was performed using the standard protocol during bolus administration of intravenous contrast. Multiplanar CT image reconstructions and MIPs were obtained to evaluate the vascular anatomy. Carotid stenosis measurements (when applicable) are obtained utilizing NASCET criteria, using the distal internal carotid diameter as the denominator.  CONTRAST:  29m OMNIPAQUE IOHEXOL 350 MG/ML SOLN  COMPARISON:  Head CTs without contrast 01/16/2014 and earlier.  FINDINGS: CTA HEAD FINDINGS  Visualized paranasal sinuses and mastoids are clear. No acute osseous abnormality identified. No acute scalp soft tissue findings.  No evidence of cortically based acute infarction identified. Gray-white matter differentiation is within normal limits throughout the brain. No abnormal enhancement identified. No intracranial mass effect. No ventriculomegaly.  VASCULAR FINDINGS:  Major intracranial venous structures are enhancing.  Patent distal vertebral arteries with mild calcified plaque. Patent bilateral PICA origins. Patent vertebrobasilar junction. Mildly dolichoectatic basilar artery with calcified plaque but no stenosis. SCA and PCA origins are within normal limits. Posterior communicating arteries are diminutive or absent. Bilateral PCA branches are within normal limits.  Calcified plaque in both ICA siphons resulting in less than 50 % stenosis with respect to the distal vessel. Normal ophthalmic artery origins. Patent carotid termini. MCA and ACA origins are within normal limits.  Diminutive or absent anterior communicating artery. Bilateral ACA branches are within  normal limits. Small infundibula of the right anterior temporal arteries arising from the right M1 segment. Right MCA branches are within normal limits. Left MCA branches are within normal limits.  Review of the MIP images confirms the  above findings.  CTA NECK FINDINGS  Negative lung apices. Right chest pacemaker generator partially visible. No superior mediastinal lymphadenopathy. Negative thyroid, larynx, pharynx, parapharyngeal spaces, retropharyngeal space, sublingual space, submandibular glands, parotid glands. Postoperative changes to the globes. Visualized paranasal sinuses and mastoids are clear. Severe degenerative changes at the left TMJ. Multilevel degenerative changes in the cervical spine. No acute osseous abnormality identified. No cervical lymphadenopathy.  VASCULAR FINDINGS:  Three vessel arch configuration. Calcified arch atherosclerosis, mostly affecting the left subclavian artery.  Right CCA origin within normal limits. Short segment of the proximal right CCA is mildly obscured by dense venous contrast. Retropharyngeal course of the right CCA. Mild calcified plaque proximal to the bifurcation. At the right ICA bifurcation calcified plaque results in up to 80 % stenosis with respect to the distal vessel (sagittal series 8, image 77). Elsewhere, the cervical right ICA is normal.  Calcified plaque at the right subclavian artery origin not resulting in hemodynamically significant stenosis. Mild calcified plaque at the right vertebral artery origin without significant stenosis. Intermittently tortuous but otherwise negative cervical right vertebral artery.  Mild calcified plaque left CCA origin without stenosis. Partially retropharyngeal course of the left CCA. Mild calcified plaque proximal to the left carotid bifurcation. Dense calcified plaque at the left carotid bifurcation resulting in up to 60 % stenosis with respect to the distal vessel (coronal series 7, image 157). Tortuous left ICA just beyond the bulb with a mildly kinked appearance, otherwise negative to the skullbase.  Calcified plaque left proximal subclavian artery without significant stenosis. Calcified plaque at the left vertebral artery origin without significant  stenosis. Tortuous proximal left vertebral. Tortuous distal left vertebral, otherwise negative to the skullbase.  Review of the MIP images confirms the above findings.  IMPRESSION: 1. Dense bilateral carotid bifurcation calcified plaque, on the right resulting in high-grade stenosis of approximately 80% with respect to the distal vessel. 60% left ICA origin stenosis. 2. No vertebral artery stenosis. Negative for age intracranial circulation except for Mild dolichoectasia. 3. Negative CT appearance of the brain, no CT evidence of acute or subacute cortically based infarct.   Electronically Signed   By: Lars Pinks M.D.   On: 01/17/2014 16:21    PHYSICAL EXAM Pleasant elderly Caucasian lady not in distress.Awake alert. Afebrile. Head is nontraumatic. Neck is supple without bruit. Hearing is normal. Cardiac exam no murmur or gallop. Lungs are clear to auscultation. Distal pulses are well felt. Neurological Exam :    Awake  Alert oriented x 3. Normal speech and language.eye movements full without nystagmus.fundi were not visualized. Vision acuity and fields appear normal. Hearing is normal. Palatal movements are normal. Face symmetric. Tongue midline. Normal strength, tone, reflexes and coordination. Normal sensation. Gait deferred. ASSESSMENT/PLAN Kayla Davis is a 80 y.o. female with history of hypertension, hyperlipidemia, aortic valve disorder and osteoarthritis, presenting with right side blurry vision (could not confirm if unlateral or bilateral). She did not receive IV t-PA due to quick resolution of symptoms. Initial CT imaging confirms unremarkable.   Blurred vision, etiology likely symptomatic proximal RICA stenosis.MRI /MRA  Unable due to pacer CT angio head and neck  1. Dense bilateral carotid bifurcation calcified plaque, on the  right resulting in high-grade stenosis of approximately 80% with  respect  to the distal vessel. 60% left ICA origin stenosis.  2. No vertebral artery  stenosis. Negative for age intracranial  circulation except for Mild dolichoectasia.  3. Negative CT appearance of the brain, no CT evidence of acute or  subacute cortically based infarct.   2D Echo  Left ventricle: The cavity size was normal. Wall thickness was increased in a pattern of mild LVH. The estimated ejection fraction was 60%. Wall motion was normal; there were no regional wall motion abnormalities.     Lovenox 40 mg sq daily for VTE prophylaxis  Cardiac thin liquids.   OOB  with assistance  Resultant blurry vision which is resolving   aspirin 325 mg orally every day prior to admission, now on aspirin 325 mg orally every day  Risk factor education  Ongoing aggressive risk factor management   ESR 20 mm and CRP elevated at 6.4  Therapy recommendations:  Home health PT, no OT  Disposition:  Home with home health PT  Hypertension  Home meds:   Losartan and hydrochlorothiazide, hydrochlorothiazide a full in-hospital for gentle hydration BP 119-152/48-63 past 24h (01/18/2014 @ 1:55 PM)  Stable  Diabetes, history of prediabetes  HgbA1c 6.3, at goal < 7.0  Hyperlipidemia  Home meds:  Zocor 40 mg daily, resumed in hospital  LDL 82  Continue statin  Continue statin at discharge  Other Stroke Risk Factors Advanced age  Other Active Problems  Possible UTI, placed on Antibiotics. Urine culture pending  Hyperthyroidism  Chronic anemia  Sick sinus syndrome status post pacemaker  Hospital day # 2  Burnetta Sabin, MSN, RN, ANVP-BC, ANP-BC, [facility] Pager: [phone] 01/18/2014 1:55 PM  I have personally examined this patient, reviewed notes, independently viewed imaging studies, participated in medical decision making and plan of care. I have made any additions or clarifications directly to the above note. Agree with note above. Recommend vascular surgery consult for urgent right carotid endarterectomy as I do not believe the  patient would be a good candidate for stenting given  extensive calcification at the right carotid bifurcation. I spent 10 minutes discussing risk benefits of carotid revascularization with the patient and answered questions.  Antony Contras, MD Medical Director [place] Pager: [phone] 01/18/2014 1:55 PM    To contact Stroke Continuity provider, please refer to [email or web address] After hours, contact General Neurology

## 2014-01-18 NOTE — Consult Note (Signed)
Hospital Consult  History of Present Illness  Kayla Davis is a 80 y.o. (05/13/1922) female who presented with right eye blurriness that lasted for approximately 15 minutes.  Her symptoms have resolved. She has no prior history of TIA or stroke symptoms. She denies any facial drooping or weakness of her extremities  The patient has never had receptive or expressive aphasia. The patient's risks factors for carotid disease include: diabetes, hypertension and hyperlipidemia. She has a past medical history of sick sinus bradycardia s/p pacemaker. She does complain chronic left leg pain due to bursitis and osteoarthritis.   She takes a daily aspirin, statin and antihypertensive medication. She denies any history of coronary artery disease. She has never been on medications for diabetes until this admission when her blood glucose was elevated and has been given insulin.   Past Medical History  Diagnosis Date  . Hypertension   . Hyperlipidemia   . Aortic valve disorders   . Unspecified hereditary and idiopathic peripheral neuropathy   . Other abnormal glucose   . Unspecified hearing loss   . Unspecified chronic bronchitis   . Unspecified hypothyroidism   . Unspecified gastritis and gastroduodenitis without mention of hemorrhage   . Diverticulosis of colon (without mention of hemorrhage)   . Irritable bowel syndrome   . Osteoarthrosis, unspecified whether generalized or localized, unspecified site   . Backache, unspecified   . Pain in limb   . Osteoporosis, unspecified   . Herpes zoster with other nervous system complications(053.19)   . Anxiety state, unspecified   . Anemia, unspecified   . Bradycardia     s/p PPM    Past Surgical History  Procedure Laterality Date  . Pacemaker insertion      MDT  . Appendectomy    . Cholecystectomy    . Tonsillectomy    . Vaginal hysterectomy    . Cataract extraction, bilateral  end of June 2013  . Cataract extraction, bilateral  beginning of  July 2013    History   Social History  . Marital Status: Widowed    Spouse Name: Gypsy Balsam.  (deceased)    Number of Children: 55  . Years of Education: N/A   Occupational History  . Retired   .     Social History Main Topics  . Smoking status: Never Smoker   . Smokeless tobacco: Never Used  . Alcohol Use: No  . Drug Use: No  . Sexual Activity: Not on file   Other Topics Concern  . Not on file   Social History Narrative  . No narrative on file    Family History  Problem Relation Age of Onset  . Heart disease Father   . Cancer Paternal Grandmother   . Diabetes Father   . Heart disease Mother    No current facility-administered medications on file prior to encounter.   Current Outpatient Prescriptions on File Prior to Encounter  Medication Sig Dispense Refill  . aspirin 325 MG tablet Take 325 mg by mouth every morning.       . cholecalciferol (VITAMIN D) 1000 UNITS tablet Take 1,000 Units by mouth daily.        . Cyanocobalamin (B-12) 2500 MCG TABS Take 2,500 mcg by mouth daily.       [name] levothyroxine (SYNTHROID) 125 MCG tablet Take 1 tablet (125 mcg total) by mouth daily before breakfast.  90 tablet  0  . losartan-hydrochlorothiazide (HYZAAR) 50-12.5 MG per tablet Take 0.5 tablets by mouth every morning.      [name]  polyethylene glycol (MIRALAX / GLYCOLAX) packet Take 17 g by mouth daily as needed for moderate constipation.       . pregabalin (LYRICA) 75 MG capsule Take 1 capsule (75 mg total) by mouth at bedtime.  90 capsule  3  . simvastatin (ZOCOR) 40 MG tablet Take 1 tablet (40 mg total) by mouth daily.  90 tablet  3  . [DISCONTINUED] valsartan-hydrochlorothiazide (DIOVAN-HCT) 80-12.5 MG per tablet TAKE 1 TABLET BY MOUTH DAILY  90 tablet  3    Allergies  Allergen Reactions  . Penicillins Anaphylaxis    REACTION: swells up all over body, pt states she almost died  . Alprazolam      REACTION: eyes swell up  . Bactrim [Sulfamethoxazole-Trimethoprim]     Unable to  remember the side effects  . Ceclor [Cefaclor]     Unsure of the side effects  . Cephalexin     Unsure of the side effects  . Ciprofloxacin Itching and Swelling  . Clindamycin/Lincomycin     Upset stomach  . Hydrocodone     Unsure of reaction to med  . Noroxin [Norfloxacin]     Unsure of side effects  . Sulfonamide Derivatives     REACTION: itching  . Tetracyclines & Related     Unsure of the reaction to this medication    REVIEW OF SYSTEMS:  (Positives checked otherwise negative)  CARDIOVASCULAR:  []  chest pain, []  chest pressure, []  palpitations, []  shortness of breath when laying flat, []  shortness of breath with exertion,  []  pain in feet when walking, []  pain in feet when laying flat, []  history of blood clot in veins (DVT), []  history of phlebitis, [x]  swelling in legs, []  varicose veins  PULMONARY:  []  productive cough, []  asthma, []  wheezing  NEUROLOGIC:  []  weakness in arms or legs, []  numbness in arms or legs, []  difficulty speaking or slurred speech, [x]  temporary loss of vision in one eye, []  dizziness  HEMATOLOGIC:  []  bleeding problems, []  problems with blood clotting too easily  MUSCULOSKEL:  [x]  joint pain, []  joint swelling  GASTROINTEST:  []  vomiting blood, []  blood in stool     GENITOURINARY:  []  burning with urination, []  blood in urine  PSYCHIATRIC:  []  history of major depression  INTEGUMENTARY:  []  rashes, []  ulcers  CONSTITUTIONAL:  []  fever, []  chills  For VQI Use Only  PRE-ADM LIVING: Home  AMB STATUS: Ambulatory with Assistance  CAD Sx: None  PRIOR CHF: None  STRESS TEST: [ ]  No, [ ]  Normal, [ ]  + ischemia, [ ]  + MI, [ ]  Both   Physical Examination  Filed Vitals:   01/17/14 2122 01/18/14 0258 01/18/14 0600 01/18/14 1016  BP: 120/53 111/44 111/50 147/95  Pulse: 72 66 65 69  Temp: 98.1 F (36.7 C) 98 F (36.7 C) 99.1 F (37.3 C) 98.2 F (36.8 C)  TempSrc: Oral Oral Oral Oral  Resp: 20 20 20 18   Height:      Weight:        SpO2: 97% 94% 92% 96%   Body mass index is 25.56 kg/(m^2).  General: A&O x 3, WD elderly female in NAD  Head: [place]/AT  Ear/Nose/Throat: Hearing grossly intact  Eyes: EOMI  Neck: Supple Pulmonary: Sym exp, good air movt, CTAB, no rales, rhonchi, & wheezing  Cardiac: RRR, Nl S1, S2, no Murmurs, rubs or gallops  Vascular: Vessel Right Left  Radial Palpable Palpable  Carotid Palpable, without bruit Palpable, without bruit  Aorta  Not palpable N/A  Popliteal Not palpable Not palpable  PT Not Palpable Not Palpable  DP Palpable Palpable   Gastrointestinal: soft, NTND, -G/R, - HSM, - masses  Musculoskeletal: M/S 5/5 throughout, Extremities without ischemic changes  Neurologic: CN 2-12 intact. Pain and light touch intact in extremities. Motor exam as listed above  Psychiatric: Judgment intact, Mood & affect appropriate for pt's clinical situation  Dermatologic: See M/S exam for extremity exam, no rashes otherwise noted  CTA 01/17/14  IMPRESSION:  1. Dense bilateral carotid bifurcation calcified plaque, on the  right resulting in high-grade stenosis of approximately 80% with  respect to the distal vessel. 60% left ICA origin stenosis.  2. No vertebral artery stenosis. Negative for age intracranial  circulation except for Mild dolichoectasia.  3. Negative CT appearance of the brain, no CT evidence of acute or  subacute cortically based infarct.  Medical Decision Making  Kayla Davis is a 80 y.o. female who presents with transient right eye blurriness. CTA has shown an 80% stenosis of the right distal ICA. Will obtain a carotid duplex to better evaluate stenosis. Discussed carotid endarterectomy with patient and patient is willing to proceed. Will possibly schedule for this week.  Will discuss with Dr. [name], her cardiologist regarding cardiac history and risk for surgery. Dr. Kellie Simmering to see patient.   Virgina Jock, PA-C [facility] Office: [phone] Pager: [phone]  01/18/2014, 12:24 PM Patient seen and examined and discussed situation with patient and her son. She seems to have had a clear-cut episode of amaurosis fugax right eye due to right carotid occlusive disease. Exact severity of stenosis is difficult to ascertain because of heavily calcified carotid bifurcation bilaterally. Patient denies any previous neurologic symptoms including previous stroke or specific TIAs or amaurosis fugax. She has no history of coronary artery disease or previous myocardial infarction but does have the pacemaker as noted which is followed closely by Dr. [name].  The patient is a reasonable operative candidate I would recommend right carotid endarterectomy. Have contacted cardiology to further evaluate patient and give opinion regarding any need for further workup prior to right carotid surgery. Patient would like to proceed with surgery this week if at all possible. I will tentatively schedule for right carotid endarterectomy for Friday depending on cardiology opinion. Will obtain carotid duplex exam today.

## 2014-01-18 NOTE — Consult Note (Signed)
Reason for Consult: Surgical Clearance  Referring Physician: Dr. Kellie Simmering (Vascular Surgery) Primary Cardiologist: Dr. [name]   HPI: The patient is a 80 y/o female, followed by Dr. [name], with a history of HTN, HLD and SSS s/p PPM insertion almost 5 years ago. There is mention of aortic valve disease in her record, however her most recent echocardiogram was 01/17/14, demonstrating normal systolic function with an estimated EF at 60%. No WMA. Visualization of the aortic valve revealed a structurally normal valve. Cusp separation was normal w/o stenosis or regurgitation. Valve area was measured at 1.97 cm^2.  Aortic root was normal in size. Only trivial MR was noted. She has no known history of CAD.   She presented to the [facility] ER 01/16/14 with complaints of transient right eye blurriness, lasting 15-20 minutes. Patient reports that she self discontinued her ASA 2 days prior to onset of symptoms, in anticipation for an appointment at [facility], as she wanted to request an injection for her back pain.   On arrival to [facility], CTA demonstrated an 80% stenosis of the right distal ICA and 60% on the left. Carotid duplex to better evaluate the stenosis is pending. As mentioned above, 2D echo was fairly normal. Pacemaker device interrogation revealed only 2 atrial high rate episodes, felt likely to be either SVT or AVNRT. No afib.  The patient was evaluated by Dr. Kellie Simmering today and the possibility of carotid endarterectomy was discussed. Cardiology has been consulted to evaluate for surgical clearance.   The patient denies any further episodes of eye blurriness. No other s/s of TIA or CVA. She denies any current or prior history of chest pain. She is a fairly independent 80 y/o. She ambulates by use of a walker. She has no limitations/ anginal like symptoms with her ADLs. Also denies significant dyspnea, no orthopnea, PND, LEE, palpitations, dizziness, syncope/ near syncope. With the exception of  temporarily holding her ASA, she reports full medication compliance. She is not a smoker.      Past Medical History  Diagnosis Date  . Hypertension   . Hyperlipidemia   . Aortic valve disorders   . Unspecified hereditary and idiopathic peripheral neuropathy   . Other abnormal glucose   . Unspecified hearing loss   . Unspecified chronic bronchitis   . Unspecified hypothyroidism   . Unspecified gastritis and gastroduodenitis without mention of hemorrhage   . Diverticulosis of colon (without mention of hemorrhage)   . Irritable bowel syndrome   . Osteoarthrosis, unspecified whether generalized or localized, unspecified site   . Backache, unspecified   . Pain in limb   . Osteoporosis, unspecified   . Herpes zoster with other nervous system complications(053.19)   . Anxiety state, unspecified   . Anemia, unspecified   . Bradycardia     s/p PPM    Past Surgical History  Procedure Laterality Date  . Pacemaker insertion      MDT  . Appendectomy    . Cholecystectomy    . Tonsillectomy    . Vaginal hysterectomy    . Cataract extraction, bilateral  end of June 2013  . Cataract extraction, bilateral  beginning of July 2013    Family History  Problem Relation Age of Onset  . Heart disease Father   . Cancer Paternal Grandmother   . Diabetes Father   . Heart disease Mother     Social History:  reports that she has never smoked. She has never used smokeless tobacco. She reports that she does not  drink alcohol or use illicit drugs.  Allergies:  Allergies  Allergen Reactions  . Penicillins Anaphylaxis    REACTION: swells up all over body, pt states she almost died  . Alprazolam      REACTION: eyes swell up  . Bactrim [Sulfamethoxazole-Trimethoprim]     Unable to remember the side effects  . Ceclor [Cefaclor]     Unsure of the side effects  . Cephalexin     Unsure of the side effects  . Ciprofloxacin Itching and Swelling  . Clindamycin/Lincomycin     Upset stomach  .  Hydrocodone     Unsure of reaction to med  . Noroxin [Norfloxacin]     Unsure of side effects  . Sulfonamide Derivatives     REACTION: itching  . Tetracyclines & Related     Unsure of the reaction to this medication    Medications:  Prior to Admission medications   Medication Sig Start Date End Date Taking? Authorizing Provider  acetaminophen (TYLENOL) 500 MG tablet Take 500 mg by mouth every 6 (six) hours as needed for moderate pain.   Yes Historical Provider, MD  aspirin 325 MG tablet Take 325 mg by mouth every morning.    Yes Historical Provider, MD  cholecalciferol (VITAMIN D) 1000 UNITS tablet Take 1,000 Units by mouth daily.     Yes Historical Provider, MD  Cyanocobalamin (B-12) 2500 MCG TABS Take 2,500 mcg by mouth daily.    Yes Historical Provider, MD  hydroxypropyl methylcellulose / hypromellose (ISOPTO TEARS / GONIOVISC) 2.5 % ophthalmic solution Place 1 drop into both eyes 2 (two) times daily.   Yes Historical Provider, MD  levothyroxine (SYNTHROID) 125 MCG tablet Take 1 tablet (125 mcg total) by mouth daily before breakfast. 10/28/13  Yes Noralee Space, MD  losartan-hydrochlorothiazide (HYZAAR) 50-12.5 MG per tablet Take 0.5 tablets by mouth every morning.   Yes Historical Provider, MD  Multiple Vitamins-Minerals (EYE VITAMINS PO) Take 1 tablet by mouth daily.   Yes Historical Provider, MD  polyethylene glycol (MIRALAX / GLYCOLAX) packet Take 17 g by mouth daily as needed for moderate constipation.  09/29/12  Yes Marin Olp, MD  pregabalin (LYRICA) 75 MG capsule Take 1 capsule (75 mg total) by mouth at bedtime. 11/15/13 11/15/14 Yes Aleksei Plotnikov V, MD  simvastatin (ZOCOR) 40 MG tablet Take 1 tablet (40 mg total) by mouth daily. 11/16/13  Yes Aleksei Plotnikov V, MD  sodium chloride (OCEAN) 0.65 % SOLN nasal spray Place 1 spray into both nostrils 2 (two) times daily as needed for congestion.   Yes Historical Provider, MD  levofloxacin (LEVAQUIN) 250 MG tablet Take 1 tablet (250  mg total) by mouth at bedtime. x3 days 01/18/14   Ripudeep Krystal Eaton, MD     Results for orders placed during the hospital encounter of 01/16/14 (from the past 48 hour(s))  CBC WITH DIFFERENTIAL     Status: Abnormal   Collection Time    01/16/14  8:21 PM      Result Value Ref Range   WBC 5.8  4.0 - 10.5 K/uL   RBC 3.65 (*) 3.87 - 5.11 MIL/uL   Hemoglobin 11.5 (*) 12.0 - 15.0 g/dL   HCT 34.8 (*) 36.0 - 46.0 %   MCV 95.3  78.0 - 100.0 fL   MCH 31.5  26.0 - 34.0 pg   MCHC 33.0  30.0 - 36.0 g/dL   RDW 12.9  11.5 - 15.5 %   Platelets 234  150 - 400 K/uL  Neutrophils Relative % 57  43 - 77 %   Neutro Abs 3.3  1.7 - 7.7 K/uL   Lymphocytes Relative 30  12 - 46 %   Lymphs Abs 1.7  0.7 - 4.0 K/uL   Monocytes Relative 9  3 - 12 %   Monocytes Absolute 0.5  0.1 - 1.0 K/uL   Eosinophils Relative 3  0 - 5 %   Eosinophils Absolute 0.2  0.0 - 0.7 K/uL   Basophils Relative 1  0 - 1 %   Basophils Absolute 0.0  0.0 - 0.1 K/uL  BASIC METABOLIC PANEL     Status: Abnormal   Collection Time    01/16/14  8:21 PM      Result Value Ref Range   Sodium 133 (*) 137 - 147 mEq/L   Potassium 4.4  3.7 - 5.3 mEq/L   Chloride 97  96 - 112 mEq/L   CO2 25  19 - 32 mEq/L   Glucose, Bld 209 (*) 70 - 99 mg/dL   BUN 23  6 - 23 mg/dL   Creatinine, Ser 0.88  0.50 - 1.10 mg/dL   Calcium 9.2  8.4 - 10.5 mg/dL   GFR calc non Af Amer 57 (*) >90 mL/min   GFR calc Af Amer 66 (*) >90 mL/min   Comment: (NOTE)     The eGFR has been calculated using the CKD EPI equation.     This calculation has not been validated in all clinical situations.     eGFR's persistently <90 mL/min signify possible Chronic Kidney     Disease.   Anion gap 11  5 - 15  URINALYSIS, ROUTINE W REFLEX MICROSCOPIC     Status: Abnormal   Collection Time    01/16/14  8:44 PM      Result Value Ref Range   Color, Urine YELLOW  YELLOW   APPearance CLEAR  CLEAR   Specific Gravity, Urine 1.014  1.005 - 1.030   pH 6.0  5.0 - 8.0   Glucose, UA NEGATIVE   NEGATIVE mg/dL   Hgb urine dipstick TRACE (*) NEGATIVE   Bilirubin Urine NEGATIVE  NEGATIVE   Ketones, ur NEGATIVE  NEGATIVE mg/dL   Protein, ur NEGATIVE  NEGATIVE mg/dL   Urobilinogen, UA 0.2  0.0 - 1.0 mg/dL   Nitrite NEGATIVE  NEGATIVE   Leukocytes, UA SMALL (*) NEGATIVE  URINE MICROSCOPIC-ADD ON     Status: Abnormal   Collection Time    01/16/14  8:44 PM      Result Value Ref Range   Squamous Epithelial / LPF RARE  RARE   WBC, UA 3-6  <3 WBC/hpf   RBC / HPF 0-2  <3 RBC/hpf   Bacteria, UA FEW (*) RARE   Urine-Other MUCOUS PRESENT    URINE CULTURE     Status: None   Collection Time    01/16/14 10:05 PM      Result Value Ref Range   Specimen Description URINE, CLEAN CATCH     Special Requests NONE     Culture  Setup Time       Value: 01/17/2014 05:10     Performed at [facility]       Value: >=100,000 COLONIES/ML     Performed at [facility]   Culture       Value: Multiple bacterial morphotypes present, none predominant. Suggest appropriate recollection if clinically indicated.     Performed at [facility]  Report Status 01/18/2014 FINAL    TSH     Status: None   Collection Time    01/17/14  6:32 AM      Result Value Ref Range   TSH 1.440  0.350 - 4.500 uIU/mL  COMPREHENSIVE METABOLIC PANEL     Status: Abnormal   Collection Time    01/17/14  6:32 AM      Result Value Ref Range   Sodium 140  137 - 147 mEq/L   Potassium 3.9  3.7 - 5.3 mEq/L   Chloride 103  96 - 112 mEq/L   CO2 24  19 - 32 mEq/L   Glucose, Bld 102 (*) 70 - 99 mg/dL   BUN 21  6 - 23 mg/dL   Creatinine, Ser 0.68  0.50 - 1.10 mg/dL   Calcium 8.8  8.4 - 10.5 mg/dL   Total Protein 6.3  6.0 - 8.3 g/dL   Albumin 3.3 (*) 3.5 - 5.2 g/dL   AST 19  0 - 37 U/L   Comment: HEMOLYSIS AT THIS LEVEL MAY AFFECT RESULT   ALT 13  0 - 35 U/L   Alkaline Phosphatase 70  39 - 117 U/L   Total Bilirubin 0.3  0.3 - 1.2 mg/dL   GFR calc non Af Amer 75 (*) >90 mL/min   GFR calc Af  Amer 87 (*) >90 mL/min   Comment: (NOTE)     The eGFR has been calculated using the CKD EPI equation.     This calculation has not been validated in all clinical situations.     eGFR's persistently <90 mL/min signify possible Chronic Kidney     Disease.   Anion gap 13  5 - 15  CBC WITH DIFFERENTIAL     Status: Abnormal   Collection Time    01/17/14  6:32 AM      Result Value Ref Range   WBC 12.0 (*) 4.0 - 10.5 K/uL   RBC 3.55 (*) 3.87 - 5.11 MIL/uL   Hemoglobin 11.1 (*) 12.0 - 15.0 g/dL   HCT 33.5 (*) 36.0 - 46.0 %   MCV 94.4  78.0 - 100.0 fL   MCH 31.3  26.0 - 34.0 pg   MCHC 33.1  30.0 - 36.0 g/dL   RDW 13.0  11.5 - 15.5 %   Platelets 189  150 - 400 K/uL   Neutrophils Relative % 86 (*) 43 - 77 %   Neutro Abs 10.3 (*) 1.7 - 7.7 K/uL   Lymphocytes Relative 9 (*) 12 - 46 %   Lymphs Abs 1.0  0.7 - 4.0 K/uL   Monocytes Relative 4  3 - 12 %   Monocytes Absolute 0.5  0.1 - 1.0 K/uL   Eosinophils Relative 1  0 - 5 %   Eosinophils Absolute 0.2  0.0 - 0.7 K/uL   Basophils Relative 0  0 - 1 %   Basophils Absolute 0.0  0.0 - 0.1 K/uL  HEMOGLOBIN A1C     Status: Abnormal   Collection Time    01/17/14  6:32 AM      Result Value Ref Range   Hemoglobin A1C 6.3 (*) <5.7 %   Comment: (NOTE)  According to the ADA Clinical Practice Recommendations for 2011, when     HbA1c is used as a screening test:      >=6.5%   Diagnostic of Diabetes Mellitus               (if abnormal result is confirmed)     5.7-6.4%   Increased risk of developing Diabetes Mellitus     References:Diagnosis and Classification of Diabetes Mellitus,Diabetes     OZHY,8657,84(ONGEX 1):S62-S69 and Standards of Medical Care in             Diabetes - 2011,Diabetes BMWU,1324,40 (Suppl 1):S11-S61.   Mean Plasma Glucose 134 (*) <117 mg/dL   Comment: Performed at [facility]     Status: None   Collection Time    01/17/14  6:32 AM       Result Value Ref Range   Cholesterol 161  0 - 200 mg/dL   Triglycerides 56  <150 mg/dL   HDL 68  >39 mg/dL   Total CHOL/HDL Ratio 2.4     VLDL 11  0 - 40 mg/dL   LDL Cholesterol 82  0 - 99 mg/dL   Comment:            Total Cholesterol/HDL:CHD Risk     Coronary Heart Disease Risk Table                         Men   Women      1/2 Average Risk   3.4   3.3      Average Risk       5.0   4.4      2 X Average Risk   9.6   7.1      3 X Average Risk  23.4   11.0                Use the calculated Patient Ratio     above and the CHD Risk Table     to determine the patient's CHD Risk.                ATP III CLASSIFICATION (LDL):      <100     mg/dL   Optimal      100-129  mg/dL   Near or Above                        Optimal      130-159  mg/dL   Borderline      160-189  mg/dL   High      >190     mg/dL   Very High  GLUCOSE, CAPILLARY     Status: Abnormal   Collection Time    01/17/14  6:45 AM      Result Value Ref Range   Glucose-Capillary 102 (*) 70 - 99 mg/dL   Comment 1 Notify RN     Comment 2 Documented in Chart    GLUCOSE, CAPILLARY     Status: Abnormal   Collection Time    01/17/14 12:10 PM      Result Value Ref Range   Glucose-Capillary 310 (*) 70 - 99 mg/dL   Comment 1 Notify RN     Comment 2 Documented in Chart    GLUCOSE, CAPILLARY     Status: Abnormal   Collection Time    01/17/14  4:35 PM      Result Value Ref  Range   Glucose-Capillary 124 (*) 70 - 99 mg/dL   Comment 1 Notify RN     Comment 2 Documented in Chart    SEDIMENTATION RATE     Status: None   Collection Time    01/17/14  6:31 PM      Result Value Ref Range   Sed Rate 20  0 - 22 mm/hr  C-REACTIVE PROTEIN     Status: Abnormal   Collection Time    01/17/14  6:31 PM      Result Value Ref Range   CRP 6.3 (*) <0.60 mg/dL   Comment: Performed at [facility], [facility]     Status: Abnormal   Collection Time    01/17/14 10:07 PM      Result Value Ref Range   Glucose-Capillary  177 (*) 70 - 99 mg/dL   Comment 1 Notify RN    GLUCOSE, CAPILLARY     Status: Abnormal   Collection Time    01/18/14  7:01 AM      Result Value Ref Range   Glucose-Capillary 115 (*) 70 - 99 mg/dL   Comment 1 Documented in Chart     Comment 2 Notify RN    GLUCOSE, CAPILLARY     Status: None   Collection Time    01/18/14 12:25 PM      Result Value Ref Range   Glucose-Capillary 78  70 - 99 mg/dL   Comment 1 Notify RN      Ct Angio Head W/cm &/or Wo Cm  01/17/2014   CLINICAL DATA:  80 year old female with right eye blurred vision of acute onset. Initial encounter. Possible acute left occiput infarct on non contrast head CT done 01/16/2014. Personal history of TIA. Initial encounter.  EXAM: CT ANGIOGRAPHY HEAD AND NECK  TECHNIQUE: Multidetector CT imaging of the head and neck was performed using the standard protocol during bolus administration of intravenous contrast. Multiplanar CT image reconstructions and MIPs were obtained to evaluate the vascular anatomy. Carotid stenosis measurements (when applicable) are obtained utilizing NASCET criteria, using the distal internal carotid diameter as the denominator.  CONTRAST:  42m OMNIPAQUE IOHEXOL 350 MG/ML SOLN  COMPARISON:  Head CTs without contrast 01/16/2014 and earlier.  FINDINGS: CTA HEAD FINDINGS  Visualized paranasal sinuses and mastoids are clear. No acute osseous abnormality identified. No acute scalp soft tissue findings.  No evidence of cortically based acute infarction identified. Gray-white matter differentiation is within normal limits throughout the brain. No abnormal enhancement identified. No intracranial mass effect. No ventriculomegaly.  VASCULAR FINDINGS:  Major intracranial venous structures are enhancing.  Patent distal vertebral arteries with mild calcified plaque. Patent bilateral PICA origins. Patent vertebrobasilar junction. Mildly dolichoectatic basilar artery with calcified plaque but no stenosis. SCA and PCA origins are within  normal limits. Posterior communicating arteries are diminutive or absent. Bilateral PCA branches are within normal limits.  Calcified plaque in both ICA siphons resulting in less than 50 % stenosis with respect to the distal vessel. Normal ophthalmic artery origins. Patent carotid termini. MCA and ACA origins are within normal limits.  Diminutive or absent anterior communicating artery. Bilateral ACA branches are within normal limits. Small infundibula of the right anterior temporal arteries arising from the right M1 segment. Right MCA branches are within normal limits. Left MCA branches are within normal limits.  Review of the MIP images confirms the above findings.  CTA NECK FINDINGS  Negative lung apices. Right chest pacemaker generator partially visible. No superior mediastinal lymphadenopathy.  Negative thyroid, larynx, pharynx, parapharyngeal spaces, retropharyngeal space, sublingual space, submandibular glands, parotid glands. Postoperative changes to the globes. Visualized paranasal sinuses and mastoids are clear. Severe degenerative changes at the left TMJ. Multilevel degenerative changes in the cervical spine. No acute osseous abnormality identified. No cervical lymphadenopathy.  VASCULAR FINDINGS:  Three vessel arch configuration. Calcified arch atherosclerosis, mostly affecting the left subclavian artery.  Right CCA origin within normal limits. Short segment of the proximal right CCA is mildly obscured by dense venous contrast. Retropharyngeal course of the right CCA. Mild calcified plaque proximal to the bifurcation. At the right ICA bifurcation calcified plaque results in up to 80 % stenosis with respect to the distal vessel (sagittal series 8, image 77). Elsewhere, the cervical right ICA is normal.  Calcified plaque at the right subclavian artery origin not resulting in hemodynamically significant stenosis. Mild calcified plaque at the right vertebral artery origin without significant stenosis.  Intermittently tortuous but otherwise negative cervical right vertebral artery.  Mild calcified plaque left CCA origin without stenosis. Partially retropharyngeal course of the left CCA. Mild calcified plaque proximal to the left carotid bifurcation. Dense calcified plaque at the left carotid bifurcation resulting in up to 60 % stenosis with respect to the distal vessel (coronal series 7, image 157). Tortuous left ICA just beyond the bulb with a mildly kinked appearance, otherwise negative to the skullbase.  Calcified plaque left proximal subclavian artery without significant stenosis. Calcified plaque at the left vertebral artery origin without significant stenosis. Tortuous proximal left vertebral. Tortuous distal left vertebral, otherwise negative to the skullbase.  Review of the MIP images confirms the above findings.  IMPRESSION: 1. Dense bilateral carotid bifurcation calcified plaque, on the right resulting in high-grade stenosis of approximately 80% with respect to the distal vessel. 60% left ICA origin stenosis. 2. No vertebral artery stenosis. Negative for age intracranial circulation except for Mild dolichoectasia. 3. Negative CT appearance of the brain, no CT evidence of acute or subacute cortically based infarct.   Electronically Signed   By: Lars Pinks M.D.   On: 01/17/2014 16:21   Dg Chest 2 View  01/17/2014   CLINICAL DATA:  Stroke yesterday, personal history of pacemaker insertion, aortic valve disorder, hypertension, hypothyroidism, hyperlipidemia  EXAM: CHEST  2 VIEW  COMPARISON:  11/18/2013  FINDINGS: RIGHT subclavian transvenous pacemaker leads project at RIGHT atrium and RIGHT ventricle.  Enlargement of cardiac silhouette with pulmonary vascular congestion.  Calcified tortuous thoracic aorta.  Bibasilar atelectasis.  No gross failure or consolidation.  Mild bronchitic changes noted.  No pleural effusion or pneumothorax.  Multilevel endplate spur formation thoracic spine and diffuse osseous  demineralization.  IMPRESSION: Enlargement of cardiac silhouette with pulmonary vascular congestion.  Bronchitic changes with bibasilar atelectasis.   Electronically Signed   By: Lavonia Dana M.D.   On: 01/17/2014 17:05   Ct Head Wo Contrast  01/16/2014   CLINICAL DATA:  Right eye blurred vision since 6 p.m. tonight.  EXAM: CT HEAD WITHOUT CONTRAST  TECHNIQUE: Contiguous axial images were obtained from the base of the skull through the vertex without intravenous contrast.  COMPARISON:  11/18/2013.  FINDINGS: Diffusely enlarged ventricles and subarachnoid spaces. Patchy white matter low density in both cerebral hemispheres. Suggestion of subtle low density involving the gray and white matter in the left occipital lobe. No intracranial hemorrhage or mass lesion. Unremarkable bones and included paranasal sinuses. Bilateral vertebral and cavernous internal carotid artery atheromatous calcifications.  IMPRESSION: 1. Possible acute left occipital lobe infarct. 2. No  intracranial hemorrhage. 3. Stable atrophy and chronic small vessel white matter ischemic changes. These results were called by telephone at the time of interpretation on 01/16/2014 at 9:41 pm to Dr. Pryor Curia , who verbally acknowledged these results.   Electronically Signed   By: Enrique Sack M.D.   On: 01/16/2014 21:43   Ct Angio Neck W/cm &/or Wo/cm  01/17/2014   CLINICAL DATA:  80 year old female with right eye blurred vision of acute onset. Initial encounter. Possible acute left occiput infarct on non contrast head CT done 01/16/2014. Personal history of TIA. Initial encounter.  EXAM: CT ANGIOGRAPHY HEAD AND NECK  TECHNIQUE: Multidetector CT imaging of the head and neck was performed using the standard protocol during bolus administration of intravenous contrast. Multiplanar CT image reconstructions and MIPs were obtained to evaluate the vascular anatomy. Carotid stenosis measurements (when applicable) are obtained utilizing NASCET criteria, using  the distal internal carotid diameter as the denominator.  CONTRAST:  24m OMNIPAQUE IOHEXOL 350 MG/ML SOLN  COMPARISON:  Head CTs without contrast 01/16/2014 and earlier.  FINDINGS: CTA HEAD FINDINGS  Visualized paranasal sinuses and mastoids are clear. No acute osseous abnormality identified. No acute scalp soft tissue findings.  No evidence of cortically based acute infarction identified. Gray-white matter differentiation is within normal limits throughout the brain. No abnormal enhancement identified. No intracranial mass effect. No ventriculomegaly.  VASCULAR FINDINGS:  Major intracranial venous structures are enhancing.  Patent distal vertebral arteries with mild calcified plaque. Patent bilateral PICA origins. Patent vertebrobasilar junction. Mildly dolichoectatic basilar artery with calcified plaque but no stenosis. SCA and PCA origins are within normal limits. Posterior communicating arteries are diminutive or absent. Bilateral PCA branches are within normal limits.  Calcified plaque in both ICA siphons resulting in less than 50 % stenosis with respect to the distal vessel. Normal ophthalmic artery origins. Patent carotid termini. MCA and ACA origins are within normal limits.  Diminutive or absent anterior communicating artery. Bilateral ACA branches are within normal limits. Small infundibula of the right anterior temporal arteries arising from the right M1 segment. Right MCA branches are within normal limits. Left MCA branches are within normal limits.  Review of the MIP images confirms the above findings.  CTA NECK FINDINGS  Negative lung apices. Right chest pacemaker generator partially visible. No superior mediastinal lymphadenopathy. Negative thyroid, larynx, pharynx, parapharyngeal spaces, retropharyngeal space, sublingual space, submandibular glands, parotid glands. Postoperative changes to the globes. Visualized paranasal sinuses and mastoids are clear. Severe degenerative changes at the left TMJ.  Multilevel degenerative changes in the cervical spine. No acute osseous abnormality identified. No cervical lymphadenopathy.  VASCULAR FINDINGS:  Three vessel arch configuration. Calcified arch atherosclerosis, mostly affecting the left subclavian artery.  Right CCA origin within normal limits. Short segment of the proximal right CCA is mildly obscured by dense venous contrast. Retropharyngeal course of the right CCA. Mild calcified plaque proximal to the bifurcation. At the right ICA bifurcation calcified plaque results in up to 80 % stenosis with respect to the distal vessel (sagittal series 8, image 77). Elsewhere, the cervical right ICA is normal.  Calcified plaque at the right subclavian artery origin not resulting in hemodynamically significant stenosis. Mild calcified plaque at the right vertebral artery origin without significant stenosis. Intermittently tortuous but otherwise negative cervical right vertebral artery.  Mild calcified plaque left CCA origin without stenosis. Partially retropharyngeal course of the left CCA. Mild calcified plaque proximal to the left carotid bifurcation. Dense calcified plaque at the left carotid bifurcation resulting in  up to 60 % stenosis with respect to the distal vessel (coronal series 7, image 157). Tortuous left ICA just beyond the bulb with a mildly kinked appearance, otherwise negative to the skullbase.  Calcified plaque left proximal subclavian artery without significant stenosis. Calcified plaque at the left vertebral artery origin without significant stenosis. Tortuous proximal left vertebral. Tortuous distal left vertebral, otherwise negative to the skullbase.  Review of the MIP images confirms the above findings.  IMPRESSION: 1. Dense bilateral carotid bifurcation calcified plaque, on the right resulting in high-grade stenosis of approximately 80% with respect to the distal vessel. 60% left ICA origin stenosis. 2. No vertebral artery stenosis. Negative for age  intracranial circulation except for Mild dolichoectasia. 3. Negative CT appearance of the brain, no CT evidence of acute or subacute cortically based infarct.   Electronically Signed   By: Lars Pinks M.D.   On: 01/17/2014 16:21    Review of Systems  Eyes: Positive for blurred vision.  Respiratory: Negative for shortness of breath.   Cardiovascular: Negative for chest pain, palpitations, orthopnea, leg swelling and PND.  Neurological: Negative for dizziness and loss of consciousness.  All other systems reviewed and are negative.  Blood pressure 147/95, pulse 69, temperature 98.2 F (36.8 C), temperature source Oral, resp. rate 18, height _0  (1.626 m), weight 149 lb (67.586 kg), SpO2 96.00%. Physical Exam  Constitutional: She is oriented to person, place, and time. She appears well-developed and well-nourished. No distress.  Neck: No JVD present. Carotid bruit is not present.  Cardiovascular: Normal rate, regular rhythm and intact distal pulses.  Exam reveals no gallop and no friction rub.   Murmur heard.  Systolic (best heard along RUSB) murmur is present with a grade of 2/6  Pulses:      Radial pulses are 2+ on the right side, and 2+ on the left side.       Dorsalis pedis pulses are 2+ on the right side, and 2+ on the left side.  Respiratory: Effort normal and breath sounds normal. No respiratory distress. She has no wheezes. She has no rales.  GI: Soft. Bowel sounds are normal. She exhibits no distension. There is no tenderness.  Musculoskeletal: She exhibits no edema.  Neurological: She is alert and oriented to person, place, and time.  Skin: Skin is warm and dry. She is not diaphoretic.  Psychiatric: She has a normal mood and affect. Her behavior is normal.    Assessment/Plan: Principal Problem:   CVA (cerebral infarction) Active Problems:   Hypothyroidism   Essential hypertension   Dyslipidemia  1. Carotid artery stenosis: R>L with 80% on the right and only 60% on the left.  Symptomatic with transient right eye blurriness after temporary self discontinuation of ASA. Right endarterectomy tentatively planned for 01/20/14. Agree with high dose ASA and continuation of statin.  2. Surgical Clearance: Her past cardiac history is only significant for SSS for which she now has a PPM. No prior history of CAD/MI. She denies any anginal like symptoms at rest nor with exertion. She has a cardiac murmur best heard along the RUSB and there is mention of aortic valve disease in her history, however her most recent 2D echo 10/6 demonstrates a structurally normal valve. Cusp separation is normal w/o stenosis or regurgitation. Valve area was measured at 1.97 cm^2.  Aortic root is normal in size. She was also noted to have normal systolic function with an EF of 60% w/o WMA. Only trivial MR was noted. Her HR and  BP are both stable. She is also on statin therapy with simvastatin. Lipid panel today reveals satisfactory levels with total cholesterol <200. LDL is 82 and HDL 68. Triglycerides only 56.  As she has not had any anginal symptoms and 2D echo is normal, no further pre-operative w/u is indicated.   Risk for non cardiac surgery: Using the Revised Cardiac Risk Index:  1. High Risk Surgery (suprainguinal vascular): Yes  2. CAD: No  3. CHF: No  4. CV disease: Yes  5. Diabetes on Insulin: No  6. Scr >2 mg/dl: No   Estimated Risk of Adverse Outcome with Non-cardiac Surgery: Moderate Risk  Estimated Rate of Myocardial Infarction, Pulmonary Embolism, Ventricular Fibrillation, Cardiac Arrest or Complete Heart Block: 6.6%  MD to follow with further recommendation.   SIMMONS, BRITTAINY 01/18/2014, 1:41 PM  Patient seen and examined. I agree with the assessment and plan as detailed above. See also my additional thoughts below.   I have seen the patient and spoken with her son. I have reviewed all of the information above. I have seen a preliminary report on the carotid Dopplers. It appears  that the stenoses by Doppler may not be as severe as suggested by the CT scan. This will be reviewed by vascular surgery. From the cardiac viewpoint the patient is stable. No further cardiac workup is needed. She is cleared for vascular surgery if it turns out to be indicated.  Dola Argyle, MD, [facility] 01/18/2014 4:39 PM

## 2014-01-18 NOTE — Progress Notes (Signed)
Patient ID: Kayla Davis  female  KNL:[id number]    DOB: 1922-08-14    DOA: 01/16/2014  PCP: [name], MD  Brief narrative:  Kayla Davis is an 80 y.o. female Mr. hypertension, hyperlipidemia, aortic valve disorder and osteoarthritis, who experienced blurring of vision involving right side at about 5:15 PM on 01/16/2014. Symptoms lasted about 15 minutes then resolved. There's been no recurrence. She has no history of stroke or TIA. Patient was off aspirin for 2 days in anticipation of steroid injection by orthopedist on 01/17/2014. CT scan of her head showed low density area involving the left occipital region suspicious for acute ischemic stroke. NIH stroke score was 1 for inconsistency with finger counting involving right visual field.   Assessment/Plan: Principal Problem:   TIA  - CT angiogram of the head and neck negative for acute CVA - Unable to obtain MRI due to pacemaker -  CTA head and neck showed 80% stenosis and right ICA, 60% in left  - Vascular surgery consult called, d/w Dr Kellie Simmering - Continue aspirin, statin  Active Problems:   Hypothyroidism  - continue Synthroid  UTI  - Urine culture and sensitivity showed more than 100,000 colonies of multiple bacterial morphotypes, continue Levaquin   Dyslipidemia On statin   History of pacemaker secondary to sick sinus syndrome   DVT Prophylaxis: lovenox  Code Status:full code  Family Communication: Discussed with patient in detail  Disposition:  Consultants:  Neurology  Vascular surgery  Procedures:  Please see below  Antibiotics:      Subjective: Patient seen and examined, denies any acute complaints, eating without difficulty, feels back to her baseline, blurred vision resolved  Objective: Weight change:  No intake or output data in the 24 hours ending 01/18/14 1207 Blood pressure 147/95, pulse 69, temperature 98.2 F (36.8 C), temperature source Oral, resp. rate 18, height 5\' 4"  (1.626  m), weight 67.586 kg (149 lb), SpO2 96.00%.  Physical Exam: General: Alert and awake, oriented x3, not in any acute distress. CVS: S1-S2 clear, no murmur rubs or gallops Chest: clear to auscultation bilaterally Abdomen: soft nontender, nondistended, normal bowel sounds  Extremities: no cyanosis, clubbing or edema noted bilaterally Neuro: Cranial nerves II-XII intact, no focal neurological deficits  Lab Results: Basic Metabolic Panel:  Recent Labs Lab 01/16/14 2021 01/17/14 0632  NA 133* 140  K 4.4 3.9  CL 97 103  CO2 25 24  GLUCOSE 209* 102*  BUN 23 21  CREATININE 0.88 0.68  CALCIUM 9.2 8.8   Liver Function Tests:  Recent Labs Lab 01/17/14 0632  AST 19  ALT 13  ALKPHOS 70  BILITOT 0.3  PROT 6.3  ALBUMIN 3.3*   No results found for this basename: LIPASE, AMYLASE,  in the last 168 hours No results found for this basename: AMMONIA,  in the last 168 hours CBC:  Recent Labs Lab 01/16/14 2021 01/17/14 0632  WBC 5.8 12.0*  NEUTROABS 3.3 10.3*  HGB 11.5* 11.1*  HCT 34.8* 33.5*  MCV 95.3 94.4  PLT 234 189   Cardiac Enzymes: No results found for this basename: CKTOTAL, CKMB, CKMBINDEX, TROPONINI,  in the last 168 hours BNP: No components found with this basename: POCBNP,  CBG:  Recent Labs Lab 01/17/14 0645 01/17/14 1210 01/17/14 1635 01/17/14 2207 01/18/14 0701  GLUCAP 102* 310* 124* 177* 115*     Micro Results: Recent Results (from the past 240 hour(s))  URINE CULTURE     Status: None   Collection Time  01/16/14 10:05 PM      Result Value Ref Range Status   Specimen Description URINE, CLEAN CATCH   Final   Special Requests NONE   Final   Culture  Setup Time     Final   Value: 01/17/2014 05:10     Performed at [facility] Count     Final   Value: >=100,000 COLONIES/ML     Performed at [facility]   Culture     Final   Value: Multiple bacterial morphotypes present, none predominant. Suggest appropriate  recollection if clinically indicated.     Performed at [facility]   Report Status 01/18/2014 FINAL   Final    Studies/Results: Ct Angio Head W/cm &/or Wo Cm  01/17/2014   CLINICAL DATA:  80 year old female with right eye blurred vision of acute onset. Initial encounter. Possible acute left occiput infarct on non contrast head CT done 01/16/2014. Personal history of TIA. Initial encounter.  EXAM: CT ANGIOGRAPHY HEAD AND NECK  TECHNIQUE: Multidetector CT imaging of the head and neck was performed using the standard protocol during bolus administration of intravenous contrast. Multiplanar CT image reconstructions and MIPs were obtained to evaluate the vascular anatomy. Carotid stenosis measurements (when applicable) are obtained utilizing NASCET criteria, using the distal internal carotid diameter as the denominator.  CONTRAST:  50mL OMNIPAQUE IOHEXOL 350 MG/ML SOLN  COMPARISON:  Head CTs without contrast 01/16/2014 and earlier.  FINDINGS: CTA HEAD FINDINGS  Visualized paranasal sinuses and mastoids are clear. No acute osseous abnormality identified. No acute scalp soft tissue findings.  No evidence of cortically based acute infarction identified. Gray-white matter differentiation is within normal limits throughout the brain. No abnormal enhancement identified. No intracranial mass effect. No ventriculomegaly.  VASCULAR FINDINGS:  Major intracranial venous structures are enhancing.  Patent distal vertebral arteries with mild calcified plaque. Patent bilateral PICA origins. Patent vertebrobasilar junction. Mildly dolichoectatic basilar artery with calcified plaque but no stenosis. SCA and PCA origins are within normal limits. Posterior communicating arteries are diminutive or absent. Bilateral PCA branches are within normal limits.  Calcified plaque in both ICA siphons resulting in less than 50 % stenosis with respect to the distal vessel. Normal ophthalmic artery origins. Patent carotid termini. MCA  and ACA origins are within normal limits.  Diminutive or absent anterior communicating artery. Bilateral ACA branches are within normal limits. Small infundibula of the right anterior temporal arteries arising from the right M1 segment. Right MCA branches are within normal limits. Left MCA branches are within normal limits.  Review of the MIP images confirms the above findings.  CTA NECK FINDINGS  Negative lung apices. Right chest pacemaker generator partially visible. No superior mediastinal lymphadenopathy. Negative thyroid, larynx, pharynx, parapharyngeal spaces, retropharyngeal space, sublingual space, submandibular glands, parotid glands. Postoperative changes to the globes. Visualized paranasal sinuses and mastoids are clear. Severe degenerative changes at the left TMJ. Multilevel degenerative changes in the cervical spine. No acute osseous abnormality identified. No cervical lymphadenopathy.  VASCULAR FINDINGS:  Three vessel arch configuration. Calcified arch atherosclerosis, mostly affecting the left subclavian artery.  Right CCA origin within normal limits. Short segment of the proximal right CCA is mildly obscured by dense venous contrast. Retropharyngeal course of the right CCA. Mild calcified plaque proximal to the bifurcation. At the right ICA bifurcation calcified plaque results in up to 80 % stenosis with respect to the distal vessel (sagittal series 8, image 77). Elsewhere, the cervical right ICA is normal.  Calcified plaque  at the right subclavian artery origin not resulting in hemodynamically significant stenosis. Mild calcified plaque at the right vertebral artery origin without significant stenosis. Intermittently tortuous but otherwise negative cervical right vertebral artery.  Mild calcified plaque left CCA origin without stenosis. Partially retropharyngeal course of the left CCA. Mild calcified plaque proximal to the left carotid bifurcation. Dense calcified plaque at the left carotid  bifurcation resulting in up to 60 % stenosis with respect to the distal vessel (coronal series 7, image 157). Tortuous left ICA just beyond the bulb with a mildly kinked appearance, otherwise negative to the skullbase.  Calcified plaque left proximal subclavian artery without significant stenosis. Calcified plaque at the left vertebral artery origin without significant stenosis. Tortuous proximal left vertebral. Tortuous distal left vertebral, otherwise negative to the skullbase.  Review of the MIP images confirms the above findings.  IMPRESSION: 1. Dense bilateral carotid bifurcation calcified plaque, on the right resulting in high-grade stenosis of approximately 80% with respect to the distal vessel. 60% left ICA origin stenosis. 2. No vertebral artery stenosis. Negative for age intracranial circulation except for Mild dolichoectasia. 3. Negative CT appearance of the brain, no CT evidence of acute or subacute cortically based infarct.   Electronically Signed   By: Lars Pinks M.D.   On: 01/17/2014 16:21   Dg Chest 2 View  01/17/2014   CLINICAL DATA:  Stroke yesterday, personal history of pacemaker insertion, aortic valve disorder, hypertension, hypothyroidism, hyperlipidemia  EXAM: CHEST  2 VIEW  COMPARISON:  11/18/2013  FINDINGS: RIGHT subclavian transvenous pacemaker leads project at RIGHT atrium and RIGHT ventricle.  Enlargement of cardiac silhouette with pulmonary vascular congestion.  Calcified tortuous thoracic aorta.  Bibasilar atelectasis.  No gross failure or consolidation.  Mild bronchitic changes noted.  No pleural effusion or pneumothorax.  Multilevel endplate spur formation thoracic spine and diffuse osseous demineralization.  IMPRESSION: Enlargement of cardiac silhouette with pulmonary vascular congestion.  Bronchitic changes with bibasilar atelectasis.   Electronically Signed   By: Lavonia Dana M.D.   On: 01/17/2014 17:05   Ct Head Wo Contrast  01/16/2014   CLINICAL DATA:  Right eye blurred  vision since 6 p.m. tonight.  EXAM: CT HEAD WITHOUT CONTRAST  TECHNIQUE: Contiguous axial images were obtained from the base of the skull through the vertex without intravenous contrast.  COMPARISON:  11/18/2013.  FINDINGS: Diffusely enlarged ventricles and subarachnoid spaces. Patchy white matter low density in both cerebral hemispheres. Suggestion of subtle low density involving the gray and white matter in the left occipital lobe. No intracranial hemorrhage or mass lesion. Unremarkable bones and included paranasal sinuses. Bilateral vertebral and cavernous internal carotid artery atheromatous calcifications.  IMPRESSION: 1. Possible acute left occipital lobe infarct. 2. No intracranial hemorrhage. 3. Stable atrophy and chronic small vessel white matter ischemic changes. These results were called by telephone at the time of interpretation on 01/16/2014 at 9:41 pm to Dr. Pryor Curia , who verbally acknowledged these results.   Electronically Signed   By: Enrique Sack M.D.   On: 01/16/2014 21:43   Ct Angio Neck W/cm &/or Wo/cm  01/17/2014   CLINICAL DATA:  80 year old female with right eye blurred vision of acute onset. Initial encounter. Possible acute left occiput infarct on non contrast head CT done 01/16/2014. Personal history of TIA. Initial encounter.  EXAM: CT ANGIOGRAPHY HEAD AND NECK  TECHNIQUE: Multidetector CT imaging of the head and neck was performed using the standard protocol during bolus administration of intravenous contrast. Multiplanar CT image reconstructions and  MIPs were obtained to evaluate the vascular anatomy. Carotid stenosis measurements (when applicable) are obtained utilizing NASCET criteria, using the distal internal carotid diameter as the denominator.  CONTRAST:  29mL OMNIPAQUE IOHEXOL 350 MG/ML SOLN  COMPARISON:  Head CTs without contrast 01/16/2014 and earlier.  FINDINGS: CTA HEAD FINDINGS  Visualized paranasal sinuses and mastoids are clear. No acute osseous abnormality  identified. No acute scalp soft tissue findings.  No evidence of cortically based acute infarction identified. Gray-white matter differentiation is within normal limits throughout the brain. No abnormal enhancement identified. No intracranial mass effect. No ventriculomegaly.  VASCULAR FINDINGS:  Major intracranial venous structures are enhancing.  Patent distal vertebral arteries with mild calcified plaque. Patent bilateral PICA origins. Patent vertebrobasilar junction. Mildly dolichoectatic basilar artery with calcified plaque but no stenosis. SCA and PCA origins are within normal limits. Posterior communicating arteries are diminutive or absent. Bilateral PCA branches are within normal limits.  Calcified plaque in both ICA siphons resulting in less than 50 % stenosis with respect to the distal vessel. Normal ophthalmic artery origins. Patent carotid termini. MCA and ACA origins are within normal limits.  Diminutive or absent anterior communicating artery. Bilateral ACA branches are within normal limits. Small infundibula of the right anterior temporal arteries arising from the right M1 segment. Right MCA branches are within normal limits. Left MCA branches are within normal limits.  Review of the MIP images confirms the above findings.  CTA NECK FINDINGS  Negative lung apices. Right chest pacemaker generator partially visible. No superior mediastinal lymphadenopathy. Negative thyroid, larynx, pharynx, parapharyngeal spaces, retropharyngeal space, sublingual space, submandibular glands, parotid glands. Postoperative changes to the globes. Visualized paranasal sinuses and mastoids are clear. Severe degenerative changes at the left TMJ. Multilevel degenerative changes in the cervical spine. No acute osseous abnormality identified. No cervical lymphadenopathy.  VASCULAR FINDINGS:  Three vessel arch configuration. Calcified arch atherosclerosis, mostly affecting the left subclavian artery.  Right CCA origin within  normal limits. Short segment of the proximal right CCA is mildly obscured by dense venous contrast. Retropharyngeal course of the right CCA. Mild calcified plaque proximal to the bifurcation. At the right ICA bifurcation calcified plaque results in up to 80 % stenosis with respect to the distal vessel (sagittal series 8, image 77). Elsewhere, the cervical right ICA is normal.  Calcified plaque at the right subclavian artery origin not resulting in hemodynamically significant stenosis. Mild calcified plaque at the right vertebral artery origin without significant stenosis. Intermittently tortuous but otherwise negative cervical right vertebral artery.  Mild calcified plaque left CCA origin without stenosis. Partially retropharyngeal course of the left CCA. Mild calcified plaque proximal to the left carotid bifurcation. Dense calcified plaque at the left carotid bifurcation resulting in up to 60 % stenosis with respect to the distal vessel (coronal series 7, image 157). Tortuous left ICA just beyond the bulb with a mildly kinked appearance, otherwise negative to the skullbase.  Calcified plaque left proximal subclavian artery without significant stenosis. Calcified plaque at the left vertebral artery origin without significant stenosis. Tortuous proximal left vertebral. Tortuous distal left vertebral, otherwise negative to the skullbase.  Review of the MIP images confirms the above findings.  IMPRESSION: 1. Dense bilateral carotid bifurcation calcified plaque, on the right resulting in high-grade stenosis of approximately 80% with respect to the distal vessel. 60% left ICA origin stenosis. 2. No vertebral artery stenosis. Negative for age intracranial circulation except for Mild dolichoectasia. 3. Negative CT appearance of the brain, no CT evidence of acute or  subacute cortically based infarct.   Electronically Signed   By: Lars Pinks M.D.   On: 01/17/2014 16:21    Medications: Scheduled Meds: . aspirin  300 mg  Rectal Daily   Or  . aspirin  325 mg Oral Daily  . enoxaparin (LOVENOX) injection  40 mg Subcutaneous Q24H  . insulin aspart  0-9 Units Subcutaneous TID WC  . levofloxacin  250 mg Oral QHS  . levothyroxine  125 mcg Oral QAC breakfast  . losartan  25 mg Oral Daily  . polyvinyl alcohol  1 drop Both Eyes BID  . pregabalin  75 mg Oral QHS  . simvastatin  40 mg Oral Daily  . vitamin B-12  2,500 mcg Oral Daily      LOS: 2 days   Aceyn Kathol M.D. [facility] 01/18/2014, 12:07 PM Pager: [phone]  If 7PM-7AM, please contact night-coverage [email or web address] Password [email or web address]

## 2014-01-18 NOTE — Progress Notes (Signed)
Physical Therapy Treatment Patient Details Name: Kayla Davis MRN: [id number] DOB: May 15, 1922 Today's Date: 01/18/2014    History of Present Illness Kayla Davis is an 80 y.o. female with h/o hypertension, hyperlipidemia, aortic valve disorder and osteoarthritis, who experienced blurring of vision involving right side at on 01/16/2014 and came to ED.  Symptoms lasted about 15 minutes then resolved. Patient was off aspirin for 2 days in anticipation of steroid injection by orthopedist on 01/17/2014. CT scan of her head showed low density area involving the left occipital region suspicious for acute ischemic stroke. NIH stroke score was 1 for inconsistency with finger counting involving right visual field. Unable to do MRI due to pacemaker.  Planned R carotid enarterectomy sometime this week (01/18/14).     PT Comments    Pt is progressing well with her mobility. She wants to be as strong as possible before this carotid artery surgery.  She continues to have an antalgic gait pattern at baseline and limited gait tolerance due to chronic pain.  She reports her son will come to stay with her for a few days when she goes home and her other son will be there nights as he has been doing prior to her admission.   Follow Up Recommendations  Home health PT;Supervision/Assistance - 24 hour     Equipment Recommendations  None recommended by PT    Recommendations for Other Services   NA     Precautions / Restrictions Precautions Precautions: Fall Precaution Comments: pt used a RW PTA an has left leg pain due to arthritis in her back per pt report.     Mobility   Transfers Overall transfer level: Needs assistance Equipment used: Rolling walker (2 wheeled) Transfers: Sit to/from Stand Sit to Stand: Min guard         General transfer comment: Min guard assist for safety due to pt's heavy reliance on upper extremity support to get to sitting.   Ambulation/Gait Ambulation/Gait  assistance: Min guard Ambulation Distance (Feet): 110 Feet Assistive device: Rolling walker (2 wheeled) Gait Pattern/deviations: Step-through pattern;Decreased stance time - left;Shuffle;Trunk flexed;Antalgic Gait velocity: decreased Gait velocity interpretation:  (between 1.8 and 2.62 ft/sec) General Gait Details: Pt reports her antalgic gait pattern is normal for her. No reports of feeling weak during gait today.  She does have limited walking tolerance PTA due to her back and left leg pain.        Modified Rankin (Stroke Patients Only) Modified Rankin (Stroke Patients Only) Pre-Morbid Rankin Score: Slight disability Modified Rankin: Moderately severe disability     Balance Overall balance assessment: Needs assistance Sitting-balance support: Feet supported;No upper extremity supported Sitting balance-Leahy Scale: Good     Standing balance support: Bilateral upper extremity supported;Single extremity supported Standing balance-Leahy Scale: Fair Standing balance comment: Pt prefers to have at least one upper extremity supported when preforming tasks like washing her hands, peri care after toileting.                     Cognition Arousal/Alertness: Awake/alert Behavior During Therapy: WFL for tasks assessed/performed Overall Cognitive Status: Within Functional Limits for tasks assessed                             Pertinent Vitals/Pain Pain Assessment: Faces Faces Pain Scale: Hurts little more Pain Location: chronic left leg and lower back pain Pain Descriptors / Indicators: Aching Pain Intervention(s): Limited activity within patient's tolerance;Monitored during session;Repositioned  PT Goals (current goals can now be found in the care plan section) Acute Rehab PT Goals Patient Stated Goal: to go home Progress towards PT goals: Progressing toward goals    Frequency  Min 4X/week    PT Plan Current plan remains appropriate       End  of Session Equipment Utilized During Treatment: Gait belt Activity Tolerance: Patient limited by pain Patient left: in chair;with call bell/phone within reach;with family/visitor present     Time: 3009-2330 PT Time Calculation (min): 18 min  Charges:  $Gait Training: 8-22 mins                      Delbert Darley B. Jamarcus Laduke, PT, DPT [phone]   01/18/2014, 4:27 PM

## 2014-01-19 DIAGNOSIS — N390 Urinary tract infection, site not specified: Secondary | ICD-10-CM

## 2014-01-19 DIAGNOSIS — R531 Weakness: Secondary | ICD-10-CM

## 2014-01-19 LAB — GLUCOSE, CAPILLARY
GLUCOSE-CAPILLARY: 142 mg/dL — AB (ref 70–99)
GLUCOSE-CAPILLARY: 125 mg/dL — AB (ref 70–99)
Glucose-Capillary: 112 mg/dL — ABNORMAL HIGH (ref 70–99)
Glucose-Capillary: 114 mg/dL — ABNORMAL HIGH (ref 70–99)

## 2014-01-19 MED ORDER — VANCOMYCIN HCL IN DEXTROSE 1-5 GM/200ML-% IV SOLN
1000.0000 mg | INTRAVENOUS | Status: AC
  Administered 2014-01-20: 1000 mg via INTRAVENOUS
  Filled 2014-01-19: qty 200

## 2014-01-19 MED ORDER — BISACODYL 10 MG RE SUPP: 10.0000 mg | RECTAL | Status: DC | PRN

## 2014-01-19 NOTE — Progress Notes (Signed)
Physical Therapy Treatment Patient Details Name: Kayla Davis MRN: [id number] DOB: 11-29-1922 Today's Date: 01/19/2014    History of Present Illness Kayla Davis is an 80 y.o. female with h/o hypertension, hyperlipidemia, aortic valve disorder and osteoarthritis, who experienced blurring of vision involving right side at on 01/16/2014 and came to ED.  Symptoms lasted about 15 minutes then resolved. Patient was off aspirin for 2 days in anticipation of steroid injection by orthopedist on 01/17/2014. CT scan of her head showed low density area involving the left occipital region suspicious for acute ischemic stroke. NIH stroke score was 1 for inconsistency with finger counting involving right visual field. Unable to do MRI due to pacemaker.  Planned R carotid enarterectomy sometime this week (01/18/14).     PT Comments    Despite a painful night (re: her legs bil), pt was still able to ambulate and practice stairs for home entry today at a min guard assist level with RW.  She continues to be anxious about her surgery planned for tomorrow, but is glad she didn't have to wait until next week.  She will need re assessment post-op to ensure she is still appropriate for HHPT at discharge.  PT will continue to follow acutely.   Follow Up Recommendations  Home health PT;Supervision/Assistance - 24 hour     Equipment Recommendations  None recommended by PT    Recommendations for Other Services   NA     Precautions / Restrictions Precautions Precautions: Fall Precaution Comments: pt used a RW PTA an has left leg pain due to arthritis in her back per pt report.     Mobility       Transfers Overall transfer level: Needs assistance Equipment used: Rolling walker (2 wheeled) Transfers: Sit to/from Stand Sit to Stand: Supervision         General transfer comment: Supervision for safety.  Pt with heavy reliance on hands for transitions from both the recliner and the toilet.    Ambulation/Gait Ambulation/Gait assistance: Min guard Ambulation Distance (Feet): 110 Feet Assistive device: Rolling walker (2 wheeled) Gait Pattern/deviations: Step-through pattern;Decreased stance time - left;Antalgic;Trunk flexed Gait velocity: decreased   General Gait Details: Pt is more flexed in her posture today, but when cued to get closer to RW and stand tall she was able to accomodate the request.  Min guard assist for safety due to abnormal gait pattern.    Stairs Stairs: Yes Stairs assistance: Supervision Stair Management: Two rails;Step to pattern;Forwards Number of Stairs: 5 General stair comments: Pt was able to safely negotiate the stairs, but does have a ramp at home as well if she doesn't feel strong enough to go through her usual entry with the stairs and bil rails.       Modified Rankin (Stroke Patients Only) Modified Rankin (Stroke Patients Only) Pre-Morbid Rankin Score: Slight disability Modified Rankin: Moderately severe disability     Balance Overall balance assessment: Needs assistance         Standing balance support: Bilateral upper extremity supported;No upper extremity supported Standing balance-Leahy Scale: Fair                      Cognition Arousal/Alertness: Awake/alert Behavior During Therapy: WFL for tasks assessed/performed Overall Cognitive Status: Within Functional Limits for tasks assessed                             Pertinent Vitals/Pain Pain Assessment: Faces Faces  Pain Scale: Hurts little more Pain Location: chronic left leg and lower back pain Pain Descriptors / Indicators: Aching Pain Intervention(s): Limited activity within patient's tolerance;Monitored during session;Repositioned           PT Goals (current goals can now be found in the care plan section) Acute Rehab PT Goals Patient Stated Goal: to go home Progress towards PT goals: Progressing toward goals    Frequency  Min 4X/week     PT Plan Current plan remains appropriate       End of Session Equipment Utilized During Treatment: Gait belt Activity Tolerance: Patient limited by pain Patient left: in chair;with call bell/phone within reach     Time: 1511-1539 PT Time Calculation (min): 28 min  Charges:  $Gait Training: 8-22 mins $Therapeutic Activity: 8-22 mins            Casady Voshell B. [place], [place], DPT [phone]   01/19/2014, 3:45 PM

## 2014-01-19 NOTE — Progress Notes (Signed)
Patient ID: Kayla Davis  female  IOE:[id number]    DOB: 10/31/1922    DOA: 01/16/2014  PCP: [name], MD  Brief narrative:  Kayla Davis is an 80 y.o. female Mr. hypertension, hyperlipidemia, aortic valve disorder and osteoarthritis, who experienced blurring of vision involving right side at about 5:15 PM on 01/16/2014. Symptoms lasted about 15 minutes then resolved. There's been no recurrence. She has no history of stroke or TIA. Patient was off aspirin for 2 days in anticipation of steroid injection by orthopedist on 01/17/2014. CT scan of her head showed low density area involving the left occipital region suspicious for acute ischemic stroke. NIH stroke score was 1 for inconsistency with finger counting involving right visual field.   Assessment/Plan: Principal Problem:   TIA with right-sided blurry vision: Symptoms resolved - CT angiogram of the head and neck negative for acute CVA - Unable to obtain MRI due to pacemaker - CTA head and neck showed 80% stenosis and right ICA, 60% in left. However carotid Doppler showed about 60% stenosis of the right and 1-39% on the left.  - Vascular surgery consulted, Dr Kellie Simmering -> planning right carotid endarterectomy on 10/9.  - Continue aspirin, statin  Active Problems:   Hypothyroidism  - continue Synthroid  UTI  - Urine culture and sensitivity showed more than 100,000 colonies of multiple bacterial morphotypes, continue Levaquin   Dyslipidemia On statin   History of pacemaker secondary to sick sinus syndrome   DVT Prophylaxis: lovenox  Code Status:full code  Family Communication: Discussed with patient in detail, discussed with patient's son, Audrianna Driskill on 4  Disposition:  Consultants:  Neurology  Vascular surgery  Procedures:  Please see below  Antibiotics:      Subjective: Patient seen and examined, denies any acute complaints, hoping that she would not have to undergo surgery but awaiting vascular  surgery decision.  Objective: Weight change:   Intake/Output Summary (Last 24 hours) at 01/19/14 1343 Last data filed at 01/19/14 0839  Gross per 24 hour  Intake    240 ml  Output      0 ml  Net    240 ml   Blood pressure 117/81, pulse 70, temperature 97.5 F (36.4 C), temperature source Axillary, resp. rate 20, height 5\' 4"  (1.626 m), weight 67.586 kg (149 lb), SpO2 99.00%.  Physical Exam: General: Alert and awake, oriented x3, not in any acute distress. CVS: S1-S2 clear, no murmur rubs or gallops Chest: clear to auscultation bilaterally Abdomen: soft nontender, nondistended, normal bowel sounds  Extremities: no cyanosis, clubbing or edema noted bilaterally Neuro: Cranial nerves II-XII intact, no focal neurological deficits  Lab Results: Basic Metabolic Panel:  Recent Labs Lab 01/16/14 2021 01/17/14 0632  NA 133* 140  K 4.4 3.9  CL 97 103  CO2 25 24  GLUCOSE 209* 102*  BUN 23 21  CREATININE 0.88 0.68  CALCIUM 9.2 8.8   Liver Function Tests:  Recent Labs Lab 01/17/14 0632  AST 19  ALT 13  ALKPHOS 70  BILITOT 0.3  PROT 6.3  ALBUMIN 3.3*   No results found for this basename: LIPASE, AMYLASE,  in the last 168 hours No results found for this basename: AMMONIA,  in the last 168 hours CBC:  Recent Labs Lab 01/16/14 2021 01/17/14 0632  WBC 5.8 12.0*  NEUTROABS 3.3 10.3*  HGB 11.5* 11.1*  HCT 34.8* 33.5*  MCV 95.3 94.4  PLT 234 189   Cardiac Enzymes: No results found for this  basename: CKTOTAL, CKMB, CKMBINDEX, TROPONINI,  in the last 168 hours BNP: No components found with this basename: POCBNP,  CBG:  Recent Labs Lab 01/18/14 1225 01/18/14 1709 01/18/14 2139 01/19/14 0648 01/19/14 1134  GLUCAP 78 126* 156* 112* 114*     Micro Results: Recent Results (from the past 240 hour(s))  URINE CULTURE     Status: None   Collection Time    01/16/14 10:05 PM      Result Value Ref Range Status   Specimen Description URINE, CLEAN CATCH   Final    Special Requests NONE   Final   Culture  Setup Time     Final   Value: 01/17/2014 05:10     Performed at [facility]     Final   Value: >=100,000 COLONIES/ML     Performed at [facility]   Culture     Final   Value: Multiple bacterial morphotypes present, none predominant. Suggest appropriate recollection if clinically indicated.     Performed at [facility]   Report Status 01/18/2014 FINAL   Final    Studies/Results: Ct Angio Head W/cm &/or Wo Cm  01/17/2014   CLINICAL DATA:  80 year old female with right eye blurred vision of acute onset. Initial encounter. Possible acute left occiput infarct on non contrast head CT done 01/16/2014. Personal history of TIA. Initial encounter.  EXAM: CT ANGIOGRAPHY HEAD AND NECK  TECHNIQUE: Multidetector CT imaging of the head and neck was performed using the standard protocol during bolus administration of intravenous contrast. Multiplanar CT image reconstructions and MIPs were obtained to evaluate the vascular anatomy. Carotid stenosis measurements (when applicable) are obtained utilizing NASCET criteria, using the distal internal carotid diameter as the denominator.  CONTRAST:  41mL OMNIPAQUE IOHEXOL 350 MG/ML SOLN  COMPARISON:  Head CTs without contrast 01/16/2014 and earlier.  FINDINGS: CTA HEAD FINDINGS  Visualized paranasal sinuses and mastoids are clear. No acute osseous abnormality identified. No acute scalp soft tissue findings.  No evidence of cortically based acute infarction identified. Gray-white matter differentiation is within normal limits throughout the brain. No abnormal enhancement identified. No intracranial mass effect. No ventriculomegaly.  VASCULAR FINDINGS:  Major intracranial venous structures are enhancing.  Patent distal vertebral arteries with mild calcified plaque. Patent bilateral PICA origins. Patent vertebrobasilar junction. Mildly dolichoectatic basilar artery with calcified plaque but no  stenosis. SCA and PCA origins are within normal limits. Posterior communicating arteries are diminutive or absent. Bilateral PCA branches are within normal limits.  Calcified plaque in both ICA siphons resulting in less than 50 % stenosis with respect to the distal vessel. Normal ophthalmic artery origins. Patent carotid termini. MCA and ACA origins are within normal limits.  Diminutive or absent anterior communicating artery. Bilateral ACA branches are within normal limits. Small infundibula of the right anterior temporal arteries arising from the right M1 segment. Right MCA branches are within normal limits. Left MCA branches are within normal limits.  Review of the MIP images confirms the above findings.  CTA NECK FINDINGS  Negative lung apices. Right chest pacemaker generator partially visible. No superior mediastinal lymphadenopathy. Negative thyroid, larynx, pharynx, parapharyngeal spaces, retropharyngeal space, sublingual space, submandibular glands, parotid glands. Postoperative changes to the globes. Visualized paranasal sinuses and mastoids are clear. Severe degenerative changes at the left TMJ. Multilevel degenerative changes in the cervical spine. No acute osseous abnormality identified. No cervical lymphadenopathy.  VASCULAR FINDINGS:  Three vessel arch configuration. Calcified arch atherosclerosis, mostly affecting the left  subclavian artery.  Right CCA origin within normal limits. Short segment of the proximal right CCA is mildly obscured by dense venous contrast. Retropharyngeal course of the right CCA. Mild calcified plaque proximal to the bifurcation. At the right ICA bifurcation calcified plaque results in up to 80 % stenosis with respect to the distal vessel (sagittal series 8, image 77). Elsewhere, the cervical right ICA is normal.  Calcified plaque at the right subclavian artery origin not resulting in hemodynamically significant stenosis. Mild calcified plaque at the right vertebral artery  origin without significant stenosis. Intermittently tortuous but otherwise negative cervical right vertebral artery.  Mild calcified plaque left CCA origin without stenosis. Partially retropharyngeal course of the left CCA. Mild calcified plaque proximal to the left carotid bifurcation. Dense calcified plaque at the left carotid bifurcation resulting in up to 60 % stenosis with respect to the distal vessel (coronal series 7, image 157). Tortuous left ICA just beyond the bulb with a mildly kinked appearance, otherwise negative to the skullbase.  Calcified plaque left proximal subclavian artery without significant stenosis. Calcified plaque at the left vertebral artery origin without significant stenosis. Tortuous proximal left vertebral. Tortuous distal left vertebral, otherwise negative to the skullbase.  Review of the MIP images confirms the above findings.  IMPRESSION: 1. Dense bilateral carotid bifurcation calcified plaque, on the right resulting in high-grade stenosis of approximately 80% with respect to the distal vessel. 60% left ICA origin stenosis. 2. No vertebral artery stenosis. Negative for age intracranial circulation except for Mild dolichoectasia. 3. Negative CT appearance of the brain, no CT evidence of acute or subacute cortically based infarct.   Electronically Signed   By: Lars Pinks M.D.   On: 01/17/2014 16:21   Dg Chest 2 View  01/17/2014   CLINICAL DATA:  Stroke yesterday, personal history of pacemaker insertion, aortic valve disorder, hypertension, hypothyroidism, hyperlipidemia  EXAM: CHEST  2 VIEW  COMPARISON:  11/18/2013  FINDINGS: RIGHT subclavian transvenous pacemaker leads project at RIGHT atrium and RIGHT ventricle.  Enlargement of cardiac silhouette with pulmonary vascular congestion.  Calcified tortuous thoracic aorta.  Bibasilar atelectasis.  No gross failure or consolidation.  Mild bronchitic changes noted.  No pleural effusion or pneumothorax.  Multilevel endplate spur formation  thoracic spine and diffuse osseous demineralization.  IMPRESSION: Enlargement of cardiac silhouette with pulmonary vascular congestion.  Bronchitic changes with bibasilar atelectasis.   Electronically Signed   By: Lavonia Dana M.D.   On: 01/17/2014 17:05   Ct Head Wo Contrast  01/16/2014   CLINICAL DATA:  Right eye blurred vision since 6 p.m. tonight.  EXAM: CT HEAD WITHOUT CONTRAST  TECHNIQUE: Contiguous axial images were obtained from the base of the skull through the vertex without intravenous contrast.  COMPARISON:  11/18/2013.  FINDINGS: Diffusely enlarged ventricles and subarachnoid spaces. Patchy white matter low density in both cerebral hemispheres. Suggestion of subtle low density involving the gray and white matter in the left occipital lobe. No intracranial hemorrhage or mass lesion. Unremarkable bones and included paranasal sinuses. Bilateral vertebral and cavernous internal carotid artery atheromatous calcifications.  IMPRESSION: 1. Possible acute left occipital lobe infarct. 2. No intracranial hemorrhage. 3. Stable atrophy and chronic small vessel white matter ischemic changes. These results were called by telephone at the time of interpretation on 01/16/2014 at 9:41 pm to Dr. Pryor Curia , who verbally acknowledged these results.   Electronically Signed   By: Enrique Sack M.D.   On: 01/16/2014 21:43   Ct Angio Neck W/cm &/or Wo/cm  01/17/2014   CLINICAL DATA:  80 year old female with right eye blurred vision of acute onset. Initial encounter. Possible acute left occiput infarct on non contrast head CT done 01/16/2014. Personal history of TIA. Initial encounter.  EXAM: CT ANGIOGRAPHY HEAD AND NECK  TECHNIQUE: Multidetector CT imaging of the head and neck was performed using the standard protocol during bolus administration of intravenous contrast. Multiplanar CT image reconstructions and MIPs were obtained to evaluate the vascular anatomy. Carotid stenosis measurements (when applicable) are  obtained utilizing NASCET criteria, using the distal internal carotid diameter as the denominator.  CONTRAST:  50mL OMNIPAQUE IOHEXOL 350 MG/ML SOLN  COMPARISON:  Head CTs without contrast 01/16/2014 and earlier.  FINDINGS: CTA HEAD FINDINGS  Visualized paranasal sinuses and mastoids are clear. No acute osseous abnormality identified. No acute scalp soft tissue findings.  No evidence of cortically based acute infarction identified. Gray-white matter differentiation is within normal limits throughout the brain. No abnormal enhancement identified. No intracranial mass effect. No ventriculomegaly.  VASCULAR FINDINGS:  Major intracranial venous structures are enhancing.  Patent distal vertebral arteries with mild calcified plaque. Patent bilateral PICA origins. Patent vertebrobasilar junction. Mildly dolichoectatic basilar artery with calcified plaque but no stenosis. SCA and PCA origins are within normal limits. Posterior communicating arteries are diminutive or absent. Bilateral PCA branches are within normal limits.  Calcified plaque in both ICA siphons resulting in less than 50 % stenosis with respect to the distal vessel. Normal ophthalmic artery origins. Patent carotid termini. MCA and ACA origins are within normal limits.  Diminutive or absent anterior communicating artery. Bilateral ACA branches are within normal limits. Small infundibula of the right anterior temporal arteries arising from the right M1 segment. Right MCA branches are within normal limits. Left MCA branches are within normal limits.  Review of the MIP images confirms the above findings.  CTA NECK FINDINGS  Negative lung apices. Right chest pacemaker generator partially visible. No superior mediastinal lymphadenopathy. Negative thyroid, larynx, pharynx, parapharyngeal spaces, retropharyngeal space, sublingual space, submandibular glands, parotid glands. Postoperative changes to the globes. Visualized paranasal sinuses and mastoids are clear.  Severe degenerative changes at the left TMJ. Multilevel degenerative changes in the cervical spine. No acute osseous abnormality identified. No cervical lymphadenopathy.  VASCULAR FINDINGS:  Three vessel arch configuration. Calcified arch atherosclerosis, mostly affecting the left subclavian artery.  Right CCA origin within normal limits. Short segment of the proximal right CCA is mildly obscured by dense venous contrast. Retropharyngeal course of the right CCA. Mild calcified plaque proximal to the bifurcation. At the right ICA bifurcation calcified plaque results in up to 80 % stenosis with respect to the distal vessel (sagittal series 8, image 77). Elsewhere, the cervical right ICA is normal.  Calcified plaque at the right subclavian artery origin not resulting in hemodynamically significant stenosis. Mild calcified plaque at the right vertebral artery origin without significant stenosis. Intermittently tortuous but otherwise negative cervical right vertebral artery.  Mild calcified plaque left CCA origin without stenosis. Partially retropharyngeal course of the left CCA. Mild calcified plaque proximal to the left carotid bifurcation. Dense calcified plaque at the left carotid bifurcation resulting in up to 60 % stenosis with respect to the distal vessel (coronal series 7, image 157). Tortuous left ICA just beyond the bulb with a mildly kinked appearance, otherwise negative to the skullbase.  Calcified plaque left proximal subclavian artery without significant stenosis. Calcified plaque at the left vertebral artery origin without significant stenosis. Tortuous proximal left vertebral. Tortuous distal left vertebral, otherwise negative  to the skullbase.  Review of the MIP images confirms the above findings.  IMPRESSION: 1. Dense bilateral carotid bifurcation calcified plaque, on the right resulting in high-grade stenosis of approximately 80% with respect to the distal vessel. 60% left ICA origin stenosis. 2. No  vertebral artery stenosis. Negative for age intracranial circulation except for Mild dolichoectasia. 3. Negative CT appearance of the brain, no CT evidence of acute or subacute cortically based infarct.   Electronically Signed   By: Lars Pinks M.D.   On: 01/17/2014 16:21    Medications: Scheduled Meds: . aspirin  300 mg Rectal Daily   Or  . aspirin  325 mg Oral Daily  . enoxaparin (LOVENOX) injection  40 mg Subcutaneous Q24H  . insulin aspart  0-9 Units Subcutaneous TID WC  . levofloxacin  250 mg Oral QHS  . levothyroxine  125 mcg Oral QAC breakfast  . losartan  25 mg Oral Daily  . polyvinyl alcohol  1 drop Both Eyes BID  . pregabalin  75 mg Oral QHS  . simvastatin  40 mg Oral Daily  . vitamin B-12  2,500 mcg Oral Daily      LOS: 3 days   Elody Kleinsasser M.D. [facility] 01/19/2014, 1:43 PM Pager: [phone]  If 7PM-7AM, please contact night-coverage [email or web address] Password [email or web address]

## 2014-01-19 NOTE — Progress Notes (Signed)
Patient ID: Kayla Davis, female   DOB: 09/06/1922, 80 y.o.   MRN: [id number]  Vascular Surgery Progress Note  Subjective: No further visual symptoms. Patient is ambulating with a walker independently. Continues to complain of discomfort in and left leg to lumbar spine disease.  Objective:  Filed Vitals:   01/19/14 1034  BP: 117/81  Pulse: 70  Temp: 97.5 F (36.4 C)  Resp: 20    General alert and oriented x3 in no apparent distress Neurologic exam normal   Labs:  Recent Labs Lab 01/16/14 2021 01/17/14 0632  CREATININE 0.88 0.68    Recent Labs Lab 01/16/14 2021 01/17/14 0632  NA 133* 140  K 4.4 3.9  CL 97 103  CO2 25 24  BUN 23 21  CREATININE 0.88 0.68  GLUCOSE 209* 102*  CALCIUM 9.2 8.8    Recent Labs Lab 01/16/14 2021 01/17/14 0632  WBC 5.8 12.0*  HGB 11.5* 11.1*  HCT 34.8* 33.5*  PLT 234 189   No results found for this basename: INR,  in the last 168 hours     Imaging: Ct Angio Head W/cm &/or Wo Cm  01/17/2014   CLINICAL DATA:  80 year old female with right eye blurred vision of acute onset. Initial encounter. Possible acute left occiput infarct on non contrast head CT done 01/16/2014. Personal history of TIA. Initial encounter.  EXAM: CT ANGIOGRAPHY HEAD AND NECK  TECHNIQUE: Multidetector CT imaging of the head and neck was performed using the standard protocol during bolus administration of intravenous contrast. Multiplanar CT image reconstructions and MIPs were obtained to evaluate the vascular anatomy. Carotid stenosis measurements (when applicable) are obtained utilizing NASCET criteria, using the distal internal carotid diameter as the denominator.  CONTRAST:  55mL OMNIPAQUE IOHEXOL 350 MG/ML SOLN  COMPARISON:  Head CTs without contrast 01/16/2014 and earlier.  FINDINGS: CTA HEAD FINDINGS  Visualized paranasal sinuses and mastoids are clear. No acute osseous abnormality identified. No acute scalp soft tissue findings.  No evidence of cortically  based acute infarction identified. Gray-white matter differentiation is within normal limits throughout the brain. No abnormal enhancement identified. No intracranial mass effect. No ventriculomegaly.  VASCULAR FINDINGS:  Major intracranial venous structures are enhancing.  Patent distal vertebral arteries with mild calcified plaque. Patent bilateral PICA origins. Patent vertebrobasilar junction. Mildly dolichoectatic basilar artery with calcified plaque but no stenosis. SCA and PCA origins are within normal limits. Posterior communicating arteries are diminutive or absent. Bilateral PCA branches are within normal limits.  Calcified plaque in both ICA siphons resulting in less than 50 % stenosis with respect to the distal vessel. Normal ophthalmic artery origins. Patent carotid termini. MCA and ACA origins are within normal limits.  Diminutive or absent anterior communicating artery. Bilateral ACA branches are within normal limits. Small infundibula of the right anterior temporal arteries arising from the right M1 segment. Right MCA branches are within normal limits. Left MCA branches are within normal limits.  Review of the MIP images confirms the above findings.  CTA NECK FINDINGS  Negative lung apices. Right chest pacemaker generator partially visible. No superior mediastinal lymphadenopathy. Negative thyroid, larynx, pharynx, parapharyngeal spaces, retropharyngeal space, sublingual space, submandibular glands, parotid glands. Postoperative changes to the globes. Visualized paranasal sinuses and mastoids are clear. Severe degenerative changes at the left TMJ. Multilevel degenerative changes in the cervical spine. No acute osseous abnormality identified. No cervical lymphadenopathy.  VASCULAR FINDINGS:  Three vessel arch configuration. Calcified arch atherosclerosis, mostly affecting the left subclavian artery.  Right CCA origin within  normal limits. Short segment of the proximal right CCA is mildly obscured by  dense venous contrast. Retropharyngeal course of the right CCA. Mild calcified plaque proximal to the bifurcation. At the right ICA bifurcation calcified plaque results in up to 80 % stenosis with respect to the distal vessel (sagittal series 8, image 77). Elsewhere, the cervical right ICA is normal.  Calcified plaque at the right subclavian artery origin not resulting in hemodynamically significant stenosis. Mild calcified plaque at the right vertebral artery origin without significant stenosis. Intermittently tortuous but otherwise negative cervical right vertebral artery.  Mild calcified plaque left CCA origin without stenosis. Partially retropharyngeal course of the left CCA. Mild calcified plaque proximal to the left carotid bifurcation. Dense calcified plaque at the left carotid bifurcation resulting in up to 60 % stenosis with respect to the distal vessel (coronal series 7, image 157). Tortuous left ICA just beyond the bulb with a mildly kinked appearance, otherwise negative to the skullbase.  Calcified plaque left proximal subclavian artery without significant stenosis. Calcified plaque at the left vertebral artery origin without significant stenosis. Tortuous proximal left vertebral. Tortuous distal left vertebral, otherwise negative to the skullbase.  Review of the MIP images confirms the above findings.  IMPRESSION: 1. Dense bilateral carotid bifurcation calcified plaque, on the right resulting in high-grade stenosis of approximately 80% with respect to the distal vessel. 60% left ICA origin stenosis. 2. No vertebral artery stenosis. Negative for age intracranial circulation except for Mild dolichoectasia. 3. Negative CT appearance of the brain, no CT evidence of acute or subacute cortically based infarct.   Electronically Signed   By: Lars Pinks M.D.   On: 01/17/2014 16:21   Dg Chest 2 View  01/17/2014   CLINICAL DATA:  Stroke yesterday, personal history of pacemaker insertion, aortic valve disorder,  hypertension, hypothyroidism, hyperlipidemia  EXAM: CHEST  2 VIEW  COMPARISON:  11/18/2013  FINDINGS: RIGHT subclavian transvenous pacemaker leads project at RIGHT atrium and RIGHT ventricle.  Enlargement of cardiac silhouette with pulmonary vascular congestion.  Calcified tortuous thoracic aorta.  Bibasilar atelectasis.  No gross failure or consolidation.  Mild bronchitic changes noted.  No pleural effusion or pneumothorax.  Multilevel endplate spur formation thoracic spine and diffuse osseous demineralization.  IMPRESSION: Enlargement of cardiac silhouette with pulmonary vascular congestion.  Bronchitic changes with bibasilar atelectasis.   Electronically Signed   By: Lavonia Dana M.D.   On: 01/17/2014 17:05   Ct Angio Neck W/cm &/or Wo/cm  01/17/2014   CLINICAL DATA:  80 year old female with right eye blurred vision of acute onset. Initial encounter. Possible acute left occiput infarct on non contrast head CT done 01/16/2014. Personal history of TIA. Initial encounter.  EXAM: CT ANGIOGRAPHY HEAD AND NECK  TECHNIQUE: Multidetector CT imaging of the head and neck was performed using the standard protocol during bolus administration of intravenous contrast. Multiplanar CT image reconstructions and MIPs were obtained to evaluate the vascular anatomy. Carotid stenosis measurements (when applicable) are obtained utilizing NASCET criteria, using the distal internal carotid diameter as the denominator.  CONTRAST:  33mL OMNIPAQUE IOHEXOL 350 MG/ML SOLN  COMPARISON:  Head CTs without contrast 01/16/2014 and earlier.  FINDINGS: CTA HEAD FINDINGS  Visualized paranasal sinuses and mastoids are clear. No acute osseous abnormality identified. No acute scalp soft tissue findings.  No evidence of cortically based acute infarction identified. Gray-white matter differentiation is within normal limits throughout the brain. No abnormal enhancement identified. No intracranial mass effect. No ventriculomegaly.  VASCULAR FINDINGS:   Major intracranial  venous structures are enhancing.  Patent distal vertebral arteries with mild calcified plaque. Patent bilateral PICA origins. Patent vertebrobasilar junction. Mildly dolichoectatic basilar artery with calcified plaque but no stenosis. SCA and PCA origins are within normal limits. Posterior communicating arteries are diminutive or absent. Bilateral PCA branches are within normal limits.  Calcified plaque in both ICA siphons resulting in less than 50 % stenosis with respect to the distal vessel. Normal ophthalmic artery origins. Patent carotid termini. MCA and ACA origins are within normal limits.  Diminutive or absent anterior communicating artery. Bilateral ACA branches are within normal limits. Small infundibula of the right anterior temporal arteries arising from the right M1 segment. Right MCA branches are within normal limits. Left MCA branches are within normal limits.  Review of the MIP images confirms the above findings.  CTA NECK FINDINGS  Negative lung apices. Right chest pacemaker generator partially visible. No superior mediastinal lymphadenopathy. Negative thyroid, larynx, pharynx, parapharyngeal spaces, retropharyngeal space, sublingual space, submandibular glands, parotid glands. Postoperative changes to the globes. Visualized paranasal sinuses and mastoids are clear. Severe degenerative changes at the left TMJ. Multilevel degenerative changes in the cervical spine. No acute osseous abnormality identified. No cervical lymphadenopathy.  VASCULAR FINDINGS:  Three vessel arch configuration. Calcified arch atherosclerosis, mostly affecting the left subclavian artery.  Right CCA origin within normal limits. Short segment of the proximal right CCA is mildly obscured by dense venous contrast. Retropharyngeal course of the right CCA. Mild calcified plaque proximal to the bifurcation. At the right ICA bifurcation calcified plaque results in up to 80 % stenosis with respect to the distal  vessel (sagittal series 8, image 77). Elsewhere, the cervical right ICA is normal.  Calcified plaque at the right subclavian artery origin not resulting in hemodynamically significant stenosis. Mild calcified plaque at the right vertebral artery origin without significant stenosis. Intermittently tortuous but otherwise negative cervical right vertebral artery.  Mild calcified plaque left CCA origin without stenosis. Partially retropharyngeal course of the left CCA. Mild calcified plaque proximal to the left carotid bifurcation. Dense calcified plaque at the left carotid bifurcation resulting in up to 60 % stenosis with respect to the distal vessel (coronal series 7, image 157). Tortuous left ICA just beyond the bulb with a mildly kinked appearance, otherwise negative to the skullbase.  Calcified plaque left proximal subclavian artery without significant stenosis. Calcified plaque at the left vertebral artery origin without significant stenosis. Tortuous proximal left vertebral. Tortuous distal left vertebral, otherwise negative to the skullbase.  Review of the MIP images confirms the above findings.  IMPRESSION: 1. Dense bilateral carotid bifurcation calcified plaque, on the right resulting in high-grade stenosis of approximately 80% with respect to the distal vessel. 60% left ICA origin stenosis. 2. No vertebral artery stenosis. Negative for age intracranial circulation except for Mild dolichoectasia. 3. Negative CT appearance of the brain, no CT evidence of acute or subacute cortically based infarct.   Electronically Signed   By: Lars Pinks M.D.   On: 01/17/2014 16:21    Assessment/Plan:   LOS: 3 days  s/p   I have reviewed the carotid duplex exam performed yesterday. Patient has heavily calcified carotid bifurcations bilaterally. CT angiogram is difficult to interpret because of heavy calcification. Patient does have irregular plaque in right carotid bifurcation which likely was the source of embolization.  In degree of severity probably only about 60% or so but further embolization could certainly occur Patient has been cleared from cardiac standpoint  I discussed again the situation at  length with the patient and the operative risks including perioperative stroke and she has decided that she would like to proceed with right carotid endarterectomy. I have rearranged her schedule so that we can perform this tomorrow since she is in the hospital. Plan right carotid endarterectomy on Friday   Tinnie Gens, MD 01/19/2014 10:36 AM

## 2014-01-20 ENCOUNTER — Encounter (HOSPITAL_COMMUNITY)
Admission: EM | Disposition: A | Discharge status: Home / Self Care | Payer: Self-pay | Source: Home / Self Care | Attending: Internal Medicine

## 2014-01-20 ENCOUNTER — Encounter (HOSPITAL_COMMUNITY): Payer: Self-pay | Admitting: Anesthesiology

## 2014-01-20 ENCOUNTER — Encounter (HOSPITAL_COMMUNITY): Payer: MEDICARE | Admitting: Anesthesiology

## 2014-01-20 ENCOUNTER — Inpatient Hospital Stay (HOSPITAL_COMMUNITY): Payer: MEDICARE | Admitting: Anesthesiology

## 2014-01-20 DIAGNOSIS — T8351XD Infection and inflammatory reaction due to indwelling urinary catheter, subsequent encounter: Secondary | ICD-10-CM

## 2014-01-20 HISTORY — PX: ENDARTERECTOMY: SHX5162

## 2014-01-20 LAB — GLUCOSE, CAPILLARY
GLUCOSE-CAPILLARY: 120 mg/dL — AB (ref 70–99)
Glucose-Capillary: 115 mg/dL — ABNORMAL HIGH (ref 70–99)
Glucose-Capillary: 115 mg/dL — ABNORMAL HIGH (ref 70–99)
Glucose-Capillary: 322 mg/dL — ABNORMAL HIGH (ref 70–99)

## 2014-01-20 LAB — BASIC METABOLIC PANEL
ANION GAP: 13 (ref 5–15)
BUN: 19 mg/dL (ref 6–23)
CHLORIDE: 103 meq/L (ref 96–112)
CO2: 22 mEq/L (ref 19–32)
Calcium: 9 mg/dL (ref 8.4–10.5)
Creatinine, Ser: 0.76 mg/dL (ref 0.50–1.10)
GFR calc Af Amer: 84 mL/min — ABNORMAL LOW (ref >90)
GFR calc non Af Amer: 73 mL/min — ABNORMAL LOW (ref >90)
Glucose, Bld: 103 mg/dL — ABNORMAL HIGH (ref 70–99)
Potassium: 4.2 mEq/L (ref 3.7–5.3)
SODIUM: 138 meq/L (ref 137–147)

## 2014-01-20 LAB — CBC
HCT: 34.1 % — ABNORMAL LOW (ref 36.0–46.0)
Hemoglobin: 11.3 g/dL — ABNORMAL LOW (ref 12.0–15.0)
MCH: 31.8 pg (ref 26.0–34.0)
MCHC: 33.1 g/dL (ref 30.0–36.0)
MCV: 96.1 fL (ref 78.0–100.0)
PLATELETS: 219 10*3/uL (ref 150–400)
RBC: 3.55 MIL/uL — AB (ref 3.87–5.11)
RDW: 13.2 % (ref 11.5–15.5)
WBC: 5.9 10*3/uL (ref 4.0–10.5)

## 2014-01-20 LAB — SURGICAL PCR SCREEN
MRSA, PCR: POSITIVE — AB
STAPHYLOCOCCUS AUREUS: POSITIVE — AB

## 2014-01-20 SURGERY — ENDARTERECTOMY, CAROTID
Anesthesia: General | Site: Neck | Laterality: Right

## 2014-01-20 MED ORDER — SODIUM CHLORIDE 0.9 % IV SOLN
10.0000 mg | INTRAVENOUS | Status: DC | PRN
  Administered 2014-01-20: 10 ug/min via INTRAVENOUS

## 2014-01-20 MED ORDER — OXYCODONE-ACETAMINOPHEN 5-325 MG PO TABS
1.0000 | ORAL_TABLET | ORAL | Status: DC | PRN
Start: 2014-01-20 — End: 2014-01-21
  Administered 2014-01-20: 1 via ORAL
  Filled 2014-01-20: qty 1

## 2014-01-20 MED ORDER — HEPARIN SODIUM (PORCINE) 1000 UNIT/ML IJ SOLN
INTRAMUSCULAR | Status: DC | PRN
  Administered 2014-01-20: 6000 [IU] via INTRAVENOUS

## 2014-01-20 MED ORDER — PROTAMINE SULFATE 10 MG/ML IV SOLN
INTRAVENOUS | Status: AC
  Filled 2014-01-20: qty 5

## 2014-01-20 MED ORDER — DEXTROSE 5 % IV SOLN
INTRAVENOUS | Status: DC | PRN
  Administered 2014-01-20: 10:00:00 via INTRAVENOUS

## 2014-01-20 MED ORDER — HYDROMORPHONE HCL 1 MG/ML IJ SOLN: 0.5000 mg | INTRAMUSCULAR | Status: DC | PRN

## 2014-01-20 MED ORDER — POTASSIUM CHLORIDE CRYS ER 20 MEQ PO TBCR: 20.0000 meq | EXTENDED_RELEASE_TABLET | Freq: Every day | ORAL | Status: DC | PRN

## 2014-01-20 MED ORDER — ONDANSETRON HCL 4 MG/2ML IJ SOLN: 4.0000 mg | Freq: Four times a day (QID) | INTRAMUSCULAR | Status: DC | PRN

## 2014-01-20 MED ORDER — CHLORHEXIDINE GLUCONATE CLOTH 2 % EX PADS
6.0000 | MEDICATED_PAD | Freq: Every day | CUTANEOUS | Status: DC
  Administered 2014-01-20 – 2014-01-21 (×2): 6 via TOPICAL

## 2014-01-20 MED ORDER — DEXAMETHASONE SODIUM PHOSPHATE 4 MG/ML IJ SOLN
INTRAMUSCULAR | Status: DC | PRN
  Administered 2014-01-20: 4 mg via INTRAVENOUS

## 2014-01-20 MED ORDER — FENTANYL CITRATE 0.05 MG/ML IJ SOLN: 25.0000 ug | INTRAMUSCULAR | Status: DC | PRN

## 2014-01-20 MED ORDER — STERILE WATER FOR INJECTION IJ SOLN
INTRAMUSCULAR | Status: AC
  Filled 2014-01-20: qty 10

## 2014-01-20 MED ORDER — PROTAMINE SULFATE 10 MG/ML IV SOLN
INTRAVENOUS | Status: DC | PRN
  Administered 2014-01-20: 50 mg via INTRAVENOUS

## 2014-01-20 MED ORDER — DOCUSATE SODIUM 100 MG PO CAPS
100.0000 mg | ORAL_CAPSULE | Freq: Every day | ORAL | Status: DC
  Administered 2014-01-21: 100 mg via ORAL
  Filled 2014-01-20: qty 1

## 2014-01-20 MED ORDER — ACETAMINOPHEN 650 MG RE SUPP: 325.0000 mg | RECTAL | Status: DC | PRN

## 2014-01-20 MED ORDER — ACETAMINOPHEN 325 MG PO TABS: 325.0000 mg | ORAL_TABLET | ORAL | Status: DC | PRN

## 2014-01-20 MED ORDER — FENTANYL CITRATE 0.05 MG/ML IJ SOLN
INTRAMUSCULAR | Status: AC
  Filled 2014-01-20: qty 5

## 2014-01-20 MED ORDER — BISACODYL 10 MG RE SUPP: 10.0000 mg | Freq: Every day | RECTAL | Status: DC | PRN

## 2014-01-20 MED ORDER — DEXAMETHASONE SODIUM PHOSPHATE 4 MG/ML IJ SOLN
INTRAMUSCULAR | Status: AC
  Filled 2014-01-20: qty 1

## 2014-01-20 MED ORDER — ONDANSETRON HCL 4 MG/2ML IJ SOLN: 4.0000 mg | Freq: Once | INTRAMUSCULAR | Status: DC | PRN

## 2014-01-20 MED ORDER — VANCOMYCIN HCL IN DEXTROSE 1-5 GM/200ML-% IV SOLN
1000.0000 mg | Freq: Two times a day (BID) | INTRAVENOUS | Status: AC
  Administered 2014-01-20 – 2014-01-21 (×2): 1000 mg via INTRAVENOUS
  Filled 2014-01-20 (×3): qty 200

## 2014-01-20 MED ORDER — NEOSTIGMINE METHYLSULFATE 10 MG/10ML IV SOLN
INTRAVENOUS | Status: DC | PRN
  Administered 2014-01-20: 5 mg via INTRAVENOUS

## 2014-01-20 MED ORDER — SODIUM CHLORIDE 0.9 % IV SOLN: INTRAVENOUS | Status: DC

## 2014-01-20 MED ORDER — SODIUM CHLORIDE 0.9 % IV SOLN: 500.0000 mL | Freq: Once | INTRAVENOUS | Status: AC | PRN

## 2014-01-20 MED ORDER — LACTATED RINGERS IV SOLN
INTRAVENOUS | Status: DC
  Administered 2014-01-20: 10:00:00 via INTRAVENOUS

## 2014-01-20 MED ORDER — PANTOPRAZOLE SODIUM 40 MG PO TBEC
40.0000 mg | DELAYED_RELEASE_TABLET | Freq: Every day | ORAL | Status: DC
  Administered 2014-01-21: 40 mg via ORAL
  Filled 2014-01-20: qty 1

## 2014-01-20 MED ORDER — MAGNESIUM SULFATE 40 MG/ML IJ SOLN: 2.0000 g | Freq: Every day | INTRAMUSCULAR | Status: DC | PRN

## 2014-01-20 MED ORDER — LIDOCAINE HCL (CARDIAC) 20 MG/ML IV SOLN
INTRAVENOUS | Status: DC | PRN
  Administered 2014-01-20: 50 mg via INTRAVENOUS
  Administered 2014-01-20: 30 mg via INTRAVENOUS

## 2014-01-20 MED ORDER — MUPIROCIN 2 % EX OINT
1.0000 "application " | TOPICAL_OINTMENT | Freq: Two times a day (BID) | CUTANEOUS | Status: DC
  Administered 2014-01-20 – 2014-01-21 (×3): 1 via NASAL
  Filled 2014-01-20: qty 22

## 2014-01-20 MED ORDER — ONDANSETRON HCL 4 MG/2ML IJ SOLN
INTRAMUSCULAR | Status: AC
  Filled 2014-01-20: qty 4

## 2014-01-20 MED ORDER — ARTIFICIAL TEARS OP OINT
TOPICAL_OINTMENT | OPHTHALMIC | Status: DC | PRN
  Administered 2014-01-20: 1 via OPHTHALMIC

## 2014-01-20 MED ORDER — METOPROLOL TARTRATE 1 MG/ML IV SOLN: 2.0000 mg | INTRAVENOUS | Status: DC | PRN

## 2014-01-20 MED ORDER — PROPOFOL 10 MG/ML IV BOLUS
INTRAVENOUS | Status: DC | PRN
  Administered 2014-01-20: 150 mg via INTRAVENOUS

## 2014-01-20 MED ORDER — HEPARIN SODIUM (PORCINE) 1000 UNIT/ML IJ SOLN
INTRAMUSCULAR | Status: AC
  Filled 2014-01-20: qty 2

## 2014-01-20 MED ORDER — FENTANYL CITRATE 0.05 MG/ML IJ SOLN
INTRAMUSCULAR | Status: DC | PRN
  Administered 2014-01-20: 100 ug via INTRAVENOUS

## 2014-01-20 MED ORDER — ONDANSETRON HCL 4 MG/2ML IJ SOLN
INTRAMUSCULAR | Status: DC | PRN
  Administered 2014-01-20 (×2): 4 mg via INTRAVENOUS

## 2014-01-20 MED ORDER — PHENOL 1.4 % MT LIQD: 1.0000 | OROMUCOSAL | Status: DC | PRN

## 2014-01-20 MED ORDER — PROPOFOL 10 MG/ML IV BOLUS
INTRAVENOUS | Status: AC
  Filled 2014-01-20: qty 20

## 2014-01-20 MED ORDER — LIDOCAINE HCL (PF) 1 % IJ SOLN
INTRAMUSCULAR | Status: AC
  Filled 2014-01-20: qty 30

## 2014-01-20 MED ORDER — 0.9 % SODIUM CHLORIDE (POUR BTL) OPTIME
TOPICAL | Status: DC | PRN
  Administered 2014-01-20: 2000 mL

## 2014-01-20 MED ORDER — LACTATED RINGERS IV SOLN
INTRAVENOUS | Status: DC | PRN
  Administered 2014-01-20: 10:00:00 via INTRAVENOUS

## 2014-01-20 MED ORDER — LABETALOL HCL 5 MG/ML IV SOLN
INTRAVENOUS | Status: DC | PRN
  Administered 2014-01-20 (×2): 5 mg via INTRAVENOUS

## 2014-01-20 MED ORDER — VECURONIUM BROMIDE 10 MG IV SOLR
INTRAVENOUS | Status: DC | PRN
  Administered 2014-01-20: 6 mg via INTRAVENOUS

## 2014-01-20 MED ORDER — LABETALOL HCL 5 MG/ML IV SOLN: 10.0000 mg | INTRAVENOUS | Status: DC | PRN

## 2014-01-20 MED ORDER — ALUM & MAG HYDROXIDE-SIMETH 200-200-20 MG/5ML PO SUSP: 15.0000 mL | ORAL | Status: DC | PRN

## 2014-01-20 MED ORDER — GUAIFENESIN-DM 100-10 MG/5ML PO SYRP: 15.0000 mL | ORAL_SOLUTION | ORAL | Status: DC | PRN

## 2014-01-20 MED ORDER — HYDRALAZINE HCL 20 MG/ML IJ SOLN: 10.0000 mg | INTRAMUSCULAR | Status: DC | PRN

## 2014-01-20 MED ORDER — SODIUM CHLORIDE 0.9 % IR SOLN
Status: DC | PRN
  Administered 2014-01-20: 10:00:00

## 2014-01-20 MED ORDER — DOPAMINE-DEXTROSE 3.2-5 MG/ML-% IV SOLN: 3.0000 ug/kg/min | INTRAVENOUS | Status: DC

## 2014-01-20 MED ORDER — GLYCOPYRROLATE 0.2 MG/ML IJ SOLN
INTRAMUSCULAR | Status: DC | PRN
  Administered 2014-01-20: 0.6 mg via INTRAVENOUS

## 2014-01-20 MED ORDER — GLYCOPYRROLATE 0.2 MG/ML IJ SOLN
INTRAMUSCULAR | Status: AC
  Filled 2014-01-20: qty 3

## 2014-01-20 SURGICAL SUPPLY — 49 items
BLADE SURG 10 STRL SS (BLADE) ×2 IMPLANT
CANISTER SUCTION 2500CC (MISCELLANEOUS) ×2 IMPLANT
CATH ROBINSON RED A/P 18FR (CATHETERS) ×2 IMPLANT
CATH SUCT 10FR WHISTLE TIP (CATHETERS) ×2 IMPLANT
CLIP TI MEDIUM 24 (CLIP) ×2 IMPLANT
CLIP TI WIDE RED SMALL 24 (CLIP) ×2 IMPLANT
CRADLE DONUT ADULT HEAD (MISCELLANEOUS) ×2 IMPLANT
DECANTER SPIKE VIAL GLASS SM (MISCELLANEOUS) IMPLANT
DRAIN HEMOVAC 1/8 X 5 (WOUND CARE) IMPLANT
DRSG COVADERM 4X8 (GAUZE/BANDAGES/DRESSINGS) ×1 IMPLANT
ELECT REM PT RETURN 9FT ADLT (ELECTROSURGICAL) ×2
ELECTRODE REM PT RTRN 9FT ADLT (ELECTROSURGICAL) ×1 IMPLANT
EVACUATOR SILICONE 100CC (DRAIN) IMPLANT
GAUZE SPONGE 4X4 12PLY STRL (GAUZE/BANDAGES/DRESSINGS) ×1 IMPLANT
GLOVE BIOGEL PI IND STRL 6.5 (GLOVE) IMPLANT
GLOVE BIOGEL PI IND STRL 7.0 (GLOVE) IMPLANT
GLOVE BIOGEL PI IND STRL 7.5 (GLOVE) IMPLANT
GLOVE BIOGEL PI IND STRL 8 (GLOVE) IMPLANT
GLOVE BIOGEL PI INDICATOR 6.5 (GLOVE) ×1
GLOVE BIOGEL PI INDICATOR 7.0 (GLOVE) ×1
GLOVE BIOGEL PI INDICATOR 7.5 (GLOVE) ×1
GLOVE BIOGEL PI INDICATOR 8 (GLOVE) ×1
GLOVE ECLIPSE 7.0 STRL STRAW (GLOVE) ×1 IMPLANT
GLOVE SS BIOGEL STRL SZ 7 (GLOVE) ×1 IMPLANT
GLOVE SUPERSENSE BIOGEL SZ 7 (GLOVE) ×2
GOWN STRL REUS W/ TWL LRG LVL3 (GOWN DISPOSABLE) ×3 IMPLANT
GOWN STRL REUS W/ TWL XL LVL3 (GOWN DISPOSABLE) IMPLANT
GOWN STRL REUS W/TWL LRG LVL3 (GOWN DISPOSABLE) ×6
GOWN STRL REUS W/TWL XL LVL3 (GOWN DISPOSABLE) ×2
INSERT FOGARTY SM (MISCELLANEOUS) ×2 IMPLANT
KIT BASIN OR (CUSTOM PROCEDURE TRAY) ×2 IMPLANT
KIT ROOM TURNOVER OR (KITS) ×2 IMPLANT
NEEDLE 22X1 1/2 (OR ONLY) (NEEDLE) IMPLANT
NS IRRIG 1000ML POUR BTL (IV SOLUTION) ×4 IMPLANT
PACK CAROTID (CUSTOM PROCEDURE TRAY) ×2 IMPLANT
PAD ARMBOARD 7.5X6 YLW CONV (MISCELLANEOUS) ×4 IMPLANT
PATCH HEMASHIELD 8X75 (Vascular Products) ×1 IMPLANT
PROBE PENCIL 8 MHZ STRL DISP (MISCELLANEOUS) ×1 IMPLANT
SHUNT CAROTID BYPASS 12FRX15.5 (VASCULAR PRODUCTS) IMPLANT
SPONGE INTESTINAL PEANUT (DISPOSABLE) ×2 IMPLANT
SUT PROLENE 6 0 CC (SUTURE) ×2 IMPLANT
SUT SILK 2 0 FS (SUTURE) ×2 IMPLANT
SUT SILK 3 0 TIES 17X18 (SUTURE)
SUT SILK 3-0 18XBRD TIE BLK (SUTURE) IMPLANT
SUT VIC AB 2-0 CT1 27 (SUTURE) ×2
SUT VIC AB 2-0 CT1 TAPERPNT 27 (SUTURE) ×1 IMPLANT
SUT VIC AB 3-0 X1 27 (SUTURE) ×2 IMPLANT
SYR CONTROL 10ML LL (SYRINGE) IMPLANT
WATER STERILE IRR 1000ML POUR (IV SOLUTION) ×2 IMPLANT

## 2014-01-20 NOTE — Progress Notes (Signed)
       Patient comfortable.   Residual urine post void after bladder scan and foley placed 530 cc output.  Smile symmetric, grip 5/5 equal, palpable pulse radial and no tongue deviation noted.  Stable disposition s/p right carotid endarterectomy  COLLINS, EMMA MAUREEN PA-C

## 2014-01-20 NOTE — Progress Notes (Signed)
Patient being transported now to Vascular Lab for her Procedure. Assessments remains unchanged as at now.[name]

## 2014-01-20 NOTE — Anesthesia Postprocedure Evaluation (Signed)
  Anesthesia Post-op Note  Patient: Kayla Davis  Procedure(s) Performed: Procedure(s): RIGHT CAROTID ARTERY ENDARTERECTOMY WITH DACRON PATCH ANGIOPLASTY (Right)  Patient Location: PACU  Anesthesia Type:General  Level of Consciousness: awake, alert  and oriented  Airway and Oxygen Therapy: Patient Spontanous Breathing and Patient connected to nasal cannula oxygen  Post-op Pain: mild  Post-op Assessment: Post-op Vital signs reviewed, Patient's Cardiovascular Status Stable, Respiratory Function Stable, Patent Airway, No signs of Nausea or vomiting and Pain level controlled  Post-op Vital Signs: stable  Last Vitals:  Filed Vitals:   01/20/14 1445  BP:   Pulse: 59  Temp: 36.4 C  Resp: 19    Complications: No apparent anesthesia complications

## 2014-01-20 NOTE — Progress Notes (Addendum)
This nurse notified of positive MRSA PCR. Protocol initiated. Patient educated.On call NP Schorr notified.  Will continue to monitor. Verdie Drown RN BSN.

## 2014-01-20 NOTE — Transfer of Care (Signed)
Immediate Anesthesia Transfer of Care Note  Patient: Kayla Davis  Procedure(s) Performed: Procedure(s): RIGHT CAROTID ARTERY ENDARTERECTOMY WITH DACRON PATCH ANGIOPLASTY (Right)  Patient Location: PACU  Anesthesia Type:General  Level of Consciousness: awake and alert   Airway & Oxygen Therapy: Patient Spontanous Breathing and Patient connected to nasal cannula oxygen  Post-op Assessment: Report given to PACU RN, Patient moving all extremities X 4 and Patient able to stick tongue midline  Post vital signs: Reviewed and stable  Complications: No apparent anesthesia complications

## 2014-01-20 NOTE — Anesthesia Procedure Notes (Signed)
Procedure Name: Intubation Date/Time: 01/20/2014 10:38 AM Performed by: Jacquiline Doe A Pre-anesthesia Checklist: Patient identified, Timeout performed, Emergency Drugs available, Suction available and Patient being monitored Patient Re-evaluated:Patient Re-evaluated prior to inductionOxygen Delivery Method: Circle system utilized Preoxygenation: Pre-oxygenation with 100% oxygen Intubation Type: IV induction and Cricoid Pressure applied Ventilation: Mask ventilation without difficulty and Oral airway inserted - appropriate to patient size Laryngoscope Size: Mac and 4 Grade View: Grade I Tube type: Oral Tube size: 7.5 mm Number of attempts: 1 Airway Equipment and Method: Stylet Placement Confirmation: ETT inserted through vocal cords under direct vision,  breath sounds checked- equal and bilateral and positive ETCO2 Secured at: 21 cm Tube secured with: Tape Dental Injury: Teeth and Oropharynx as per pre-operative assessment

## 2014-01-20 NOTE — Anesthesia Preprocedure Evaluation (Signed)
Anesthesia Evaluation  Patient identified by MRN, date of birth, ID band Patient awake    Reviewed: Allergy & Precautions, H&P , NPO status , Patient's Chart, lab work & pertinent test results  Airway  TM Distance: >3 FB Neck ROM: Full    Dental  (+) Edentulous Upper, Edentulous Lower   Pulmonary  breath sounds clear to auscultation        Cardiovascular hypertension, Rhythm:Regular Rate:Normal     Neuro/Psych    GI/Hepatic   Endo/Other    Renal/GU      Musculoskeletal   Abdominal   Peds  Hematology   Anesthesia Other Findings   Reproductive/Obstetrics                           Anesthesia Physical Anesthesia Plan  ASA: III  Anesthesia Plan: General   Post-op Pain Management:    Induction: Intravenous  Airway Management Planned: Oral ETT  Additional Equipment: Arterial line  Intra-op Plan:   Post-operative Plan: Extubation in OR  Informed Consent: I have reviewed the patients History and Physical, chart, labs and discussed the procedure including the risks, benefits and alternatives for the proposed anesthesia with the patient or authorized representative who has indicated his/her understanding and acceptance.     Plan Discussed with: CRNA and Anesthesiologist  Anesthesia Plan Comments:         Anesthesia Quick Evaluation

## 2014-01-20 NOTE — Op Note (Signed)
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 Following induction of satisfactory general endotracheal anesthesia the right neck was prepped and draped in a routine sterile manner. Incision was made on the anterior border of the sternocleidomastoid muscle and carried down through the subcutaneous tissue and platysma using the Bovie. Care was taken not to injure the hypoglossal nerve.. The common internal and external carotid arteries were dissected free. There was a calcified atherosclerotic plaque at the carotid bifurcation extending up the internal carotid artery. A #10 shunt was then prepared and the patient was heparinized. The carotid vessels were occluded with vascular clamps. A longitudinal opening was made in the common carotid with a 15 blade extended up the internal carotid with the Potts scissors to a point distal to the disease. The plaque was approximately 60 % stenotic in severity with focal ulceration.. The distal vessel appeared normal. Shunt was inserted without difficulty reestablishing flow in about 2 minutes. A standard endarterectomy was performed with an eversion endarterectomy of the external carotid. The plaque feathered off  the distal internal carotid artery nicely not requiring any tacking sutures. The lumen was thoroughly irrigated with heparinized saline and loose debris all carefully removed. The arterotomy was then closed with a patch using continuous 6-0 Prolene. Prior to completion of the  Closure the  shunt was removed after  approximately 30 minutes of shunt time. Flow was then reestablished up the external branch initially followed by the internal branch. Protamine was given to her reverse the heparin.Following adequate hemostasis the wound was irrigated with saline and closed in layers with Vicryl ain a subcuticular fashion. Sterile dressing was applied and the patient taken to the recovery room in stable condition.  Tinnie Gens, MD 01/20/2014 12:03 PM

## 2014-01-20 NOTE — Progress Notes (Signed)
Patient ID: Kayla Davis  female  ALP:[id number]    DOB: 10/14/1922    DOA: 01/16/2014  PCP: [name], MD  Brief narrative:  Kayla Davis is an 80 y.o. female Mr. hypertension, hyperlipidemia, aortic valve disorder and osteoarthritis, who experienced blurring of vision involving right side at about 5:15 PM on 01/16/2014. Symptoms lasted about 15 minutes then resolved. There's been no recurrence. She has no history of stroke or TIA. Patient was off aspirin for 2 days in anticipation of steroid injection by orthopedist on 01/17/2014. CT scan of her head showed low density area involving the left occipital region suspicious for acute ischemic stroke. NIH stroke score was 1 for inconsistency with finger counting involving right visual field.   Assessment/Plan: Principal Problem:   TIA with right-sided blurry vision: Symptoms resolved - CT angiogram of the head and neck negative for acute CVA - Unable to obtain MRI due to pacemaker - CTA head and neck showed 80% stenosis and right ICA, 60% in left. However carotid Doppler showed about 60% stenosis of the right and 1-39% on the left.  - Status post right carotid endarterectomy - Continue aspirin, statin  Active Problems:   Hypothyroidism  - continue Synthroid  UTI  - Urine culture and sensitivity showed more than 100,000 colonies of multiple bacterial morphotypes, continue Levaquin   Dyslipidemia On statin   History of pacemaker secondary to sick sinus syndrome   DVT Prophylaxis: lovenox  Code Status:full code  Family Communication:  Disposition:  Consultants:  Neurology  Vascular surgery  Procedures:  Please see below  Antibiotics:      Subjective: Patient seen and examined in PACU, alert and awake and oriented, in no acute issues  Objective: Weight change:   Intake/Output Summary (Last 24 hours) at 01/20/14 1756 Last data filed at 01/20/14 1646  Gross per 24 hour  Intake   1440 ml  Output     850 ml  Net    590 ml   Blood pressure 151/58, pulse 59, temperature 98.4 F (36.9 C), temperature source Oral, resp. rate 18, height 5\' 4"  (1.626 m), weight 67.586 kg (149 lb), SpO2 94.00%.  Physical Exam: General: Alert and awake, oriented x3, not in any acute distress, dressing intact on the right side of neck. CVS: S1-S2 clear, no murmur rubs or gallops Chest: clear to auscultation bilaterally Abdomen: soft nontender, nondistended, normal bowel sounds  Extremities: no cyanosis, clubbing or edema noted bilaterally   Lab Results: Basic Metabolic Panel:  Recent Labs Lab 01/17/14 0632 01/20/14 0630  NA 140 138  K 3.9 4.2  CL 103 103  CO2 24 22  GLUCOSE 102* 103*  BUN 21 19  CREATININE 0.68 0.76  CALCIUM 8.8 9.0   Liver Function Tests:  Recent Labs Lab 01/17/14 0632  AST 19  ALT 13  ALKPHOS 70  BILITOT 0.3  PROT 6.3  ALBUMIN 3.3*   No results found for this basename: LIPASE, AMYLASE,  in the last 168 hours No results found for this basename: AMMONIA,  in the last 168 hours CBC:  Recent Labs Lab 01/17/14 0632 01/20/14 0630  WBC 12.0* 5.9  NEUTROABS 10.3*  --   HGB 11.1* 11.3*  HCT 33.5* 34.1*  MCV 94.4 96.1  PLT 189 219   Cardiac Enzymes: No results found for this basename: CKTOTAL, CKMB, CKMBINDEX, TROPONINI,  in the last 168 hours BNP: No components found with this basename: POCBNP,  CBG:  Recent Labs Lab 01/19/14 1639 01/19/14 2134  01/20/14 0558 01/20/14 0816 01/20/14 1244  GLUCAP 142* 125* 115* 115* 120*     Micro Results: Recent Results (from the past 240 hour(s))  URINE CULTURE     Status: None   Collection Time    01/16/14 10:05 PM      Result Value Ref Range Status   Specimen Description URINE, CLEAN CATCH   Final   Special Requests NONE   Final   Culture  Setup Time     Final   Value: 01/17/2014 05:10     Performed at [facility]     Final   Value: >=100,000 COLONIES/ML     Performed at [facility]   Culture     Final   Value: Multiple bacterial morphotypes present, none predominant. Suggest appropriate recollection if clinically indicated.     Performed at [facility]   Report Status 01/18/2014 FINAL   Final  SURGICAL PCR SCREEN     Status: Abnormal   Collection Time    01/20/14  3:12 AM      Result Value Ref Range Status   MRSA, PCR POSITIVE (*) NEGATIVE Final   Comment: RESULT CALLED TO, READ BACK BY AND VERIFIED WITH:     A MESSER,RN [id number] SKEENP/WILDERK   Staphylococcus aureus POSITIVE (*) NEGATIVE Final   Comment:            The Xpert SA Assay (FDA     approved for NASAL specimens     in patients over 3 years of age),     is one component of     a comprehensive surveillance     program.  Test performance has     been validated by [employer] for patients greater     than or equal to 67 year old.     It is not intended     to diagnose infection nor to     guide or monitor treatment.    Studies/Results: Ct Angio Head W/cm &/or Wo Cm  01/17/2014   CLINICAL DATA:  80 year old female with right eye blurred vision of acute onset. Initial encounter. Possible acute left occiput infarct on non contrast head CT done 01/16/2014. Personal history of TIA. Initial encounter.  EXAM: CT ANGIOGRAPHY HEAD AND NECK  TECHNIQUE: Multidetector CT imaging of the head and neck was performed using the standard protocol during bolus administration of intravenous contrast. Multiplanar CT image reconstructions and MIPs were obtained to evaluate the vascular anatomy. Carotid stenosis measurements (when applicable) are obtained utilizing NASCET criteria, using the distal internal carotid diameter as the denominator.  CONTRAST:  73mL OMNIPAQUE IOHEXOL 350 MG/ML SOLN  COMPARISON:  Head CTs without contrast 01/16/2014 and earlier.  FINDINGS: CTA HEAD FINDINGS  Visualized paranasal sinuses and mastoids are clear. No acute osseous abnormality identified. No acute scalp soft tissue  findings.  No evidence of cortically based acute infarction identified. Gray-white matter differentiation is within normal limits throughout the brain. No abnormal enhancement identified. No intracranial mass effect. No ventriculomegaly.  VASCULAR FINDINGS:  Major intracranial venous structures are enhancing.  Patent distal vertebral arteries with mild calcified plaque. Patent bilateral PICA origins. Patent vertebrobasilar junction. Mildly dolichoectatic basilar artery with calcified plaque but no stenosis. SCA and PCA origins are within normal limits. Posterior communicating arteries are diminutive or absent. Bilateral PCA branches are within normal limits.  Calcified plaque in both ICA siphons resulting in less than 50 % stenosis with respect  to the distal vessel. Normal ophthalmic artery origins. Patent carotid termini. MCA and ACA origins are within normal limits.  Diminutive or absent anterior communicating artery. Bilateral ACA branches are within normal limits. Small infundibula of the right anterior temporal arteries arising from the right M1 segment. Right MCA branches are within normal limits. Left MCA branches are within normal limits.  Review of the MIP images confirms the above findings.  CTA NECK FINDINGS  Negative lung apices. Right chest pacemaker generator partially visible. No superior mediastinal lymphadenopathy. Negative thyroid, larynx, pharynx, parapharyngeal spaces, retropharyngeal space, sublingual space, submandibular glands, parotid glands. Postoperative changes to the globes. Visualized paranasal sinuses and mastoids are clear. Severe degenerative changes at the left TMJ. Multilevel degenerative changes in the cervical spine. No acute osseous abnormality identified. No cervical lymphadenopathy.  VASCULAR FINDINGS:  Three vessel arch configuration. Calcified arch atherosclerosis, mostly affecting the left subclavian artery.  Right CCA origin within normal limits. Short segment of the  proximal right CCA is mildly obscured by dense venous contrast. Retropharyngeal course of the right CCA. Mild calcified plaque proximal to the bifurcation. At the right ICA bifurcation calcified plaque results in up to 80 % stenosis with respect to the distal vessel (sagittal series 8, image 77). Elsewhere, the cervical right ICA is normal.  Calcified plaque at the right subclavian artery origin not resulting in hemodynamically significant stenosis. Mild calcified plaque at the right vertebral artery origin without significant stenosis. Intermittently tortuous but otherwise negative cervical right vertebral artery.  Mild calcified plaque left CCA origin without stenosis. Partially retropharyngeal course of the left CCA. Mild calcified plaque proximal to the left carotid bifurcation. Dense calcified plaque at the left carotid bifurcation resulting in up to 60 % stenosis with respect to the distal vessel (coronal series 7, image 157). Tortuous left ICA just beyond the bulb with a mildly kinked appearance, otherwise negative to the skullbase.  Calcified plaque left proximal subclavian artery without significant stenosis. Calcified plaque at the left vertebral artery origin without significant stenosis. Tortuous proximal left vertebral. Tortuous distal left vertebral, otherwise negative to the skullbase.  Review of the MIP images confirms the above findings.  IMPRESSION: 1. Dense bilateral carotid bifurcation calcified plaque, on the right resulting in high-grade stenosis of approximately 80% with respect to the distal vessel. 60% left ICA origin stenosis. 2. No vertebral artery stenosis. Negative for age intracranial circulation except for Mild dolichoectasia. 3. Negative CT appearance of the brain, no CT evidence of acute or subacute cortically based infarct.   Electronically Signed   By: Lars Pinks M.D.   On: 01/17/2014 16:21   Dg Chest 2 View  01/17/2014   CLINICAL DATA:  Stroke yesterday, personal history of  pacemaker insertion, aortic valve disorder, hypertension, hypothyroidism, hyperlipidemia  EXAM: CHEST  2 VIEW  COMPARISON:  11/18/2013  FINDINGS: RIGHT subclavian transvenous pacemaker leads project at RIGHT atrium and RIGHT ventricle.  Enlargement of cardiac silhouette with pulmonary vascular congestion.  Calcified tortuous thoracic aorta.  Bibasilar atelectasis.  No gross failure or consolidation.  Mild bronchitic changes noted.  No pleural effusion or pneumothorax.  Multilevel endplate spur formation thoracic spine and diffuse osseous demineralization.  IMPRESSION: Enlargement of cardiac silhouette with pulmonary vascular congestion.  Bronchitic changes with bibasilar atelectasis.   Electronically Signed   By: Lavonia Dana M.D.   On: 01/17/2014 17:05   Ct Head Wo Contrast  01/16/2014   CLINICAL DATA:  Right eye blurred vision since 6 p.m. tonight.  EXAM: CT HEAD WITHOUT  CONTRAST  TECHNIQUE: Contiguous axial images were obtained from the base of the skull through the vertex without intravenous contrast.  COMPARISON:  11/18/2013.  FINDINGS: Diffusely enlarged ventricles and subarachnoid spaces. Patchy white matter low density in both cerebral hemispheres. Suggestion of subtle low density involving the gray and white matter in the left occipital lobe. No intracranial hemorrhage or mass lesion. Unremarkable bones and included paranasal sinuses. Bilateral vertebral and cavernous internal carotid artery atheromatous calcifications.  IMPRESSION: 1. Possible acute left occipital lobe infarct. 2. No intracranial hemorrhage. 3. Stable atrophy and chronic small vessel white matter ischemic changes. These results were called by telephone at the time of interpretation on 01/16/2014 at 9:41 pm to Dr. Pryor Curia , who verbally acknowledged these results.   Electronically Signed   By: Enrique Sack M.D.   On: 01/16/2014 21:43   Ct Angio Neck W/cm &/or Wo/cm  01/17/2014   CLINICAL DATA:  80 year old female with right eye  blurred vision of acute onset. Initial encounter. Possible acute left occiput infarct on non contrast head CT done 01/16/2014. Personal history of TIA. Initial encounter.  EXAM: CT ANGIOGRAPHY HEAD AND NECK  TECHNIQUE: Multidetector CT imaging of the head and neck was performed using the standard protocol during bolus administration of intravenous contrast. Multiplanar CT image reconstructions and MIPs were obtained to evaluate the vascular anatomy. Carotid stenosis measurements (when applicable) are obtained utilizing NASCET criteria, using the distal internal carotid diameter as the denominator.  CONTRAST:  65mL OMNIPAQUE IOHEXOL 350 MG/ML SOLN  COMPARISON:  Head CTs without contrast 01/16/2014 and earlier.  FINDINGS: CTA HEAD FINDINGS  Visualized paranasal sinuses and mastoids are clear. No acute osseous abnormality identified. No acute scalp soft tissue findings.  No evidence of cortically based acute infarction identified. Gray-white matter differentiation is within normal limits throughout the brain. No abnormal enhancement identified. No intracranial mass effect. No ventriculomegaly.  VASCULAR FINDINGS:  Major intracranial venous structures are enhancing.  Patent distal vertebral arteries with mild calcified plaque. Patent bilateral PICA origins. Patent vertebrobasilar junction. Mildly dolichoectatic basilar artery with calcified plaque but no stenosis. SCA and PCA origins are within normal limits. Posterior communicating arteries are diminutive or absent. Bilateral PCA branches are within normal limits.  Calcified plaque in both ICA siphons resulting in less than 50 % stenosis with respect to the distal vessel. Normal ophthalmic artery origins. Patent carotid termini. MCA and ACA origins are within normal limits.  Diminutive or absent anterior communicating artery. Bilateral ACA branches are within normal limits. Small infundibula of the right anterior temporal arteries arising from the right M1 segment.  Right MCA branches are within normal limits. Left MCA branches are within normal limits.  Review of the MIP images confirms the above findings.  CTA NECK FINDINGS  Negative lung apices. Right chest pacemaker generator partially visible. No superior mediastinal lymphadenopathy. Negative thyroid, larynx, pharynx, parapharyngeal spaces, retropharyngeal space, sublingual space, submandibular glands, parotid glands. Postoperative changes to the globes. Visualized paranasal sinuses and mastoids are clear. Severe degenerative changes at the left TMJ. Multilevel degenerative changes in the cervical spine. No acute osseous abnormality identified. No cervical lymphadenopathy.  VASCULAR FINDINGS:  Three vessel arch configuration. Calcified arch atherosclerosis, mostly affecting the left subclavian artery.  Right CCA origin within normal limits. Short segment of the proximal right CCA is mildly obscured by dense venous contrast. Retropharyngeal course of the right CCA. Mild calcified plaque proximal to the bifurcation. At the right ICA bifurcation calcified plaque results in up to 80 % stenosis  with respect to the distal vessel (sagittal series 8, image 77). Elsewhere, the cervical right ICA is normal.  Calcified plaque at the right subclavian artery origin not resulting in hemodynamically significant stenosis. Mild calcified plaque at the right vertebral artery origin without significant stenosis. Intermittently tortuous but otherwise negative cervical right vertebral artery.  Mild calcified plaque left CCA origin without stenosis. Partially retropharyngeal course of the left CCA. Mild calcified plaque proximal to the left carotid bifurcation. Dense calcified plaque at the left carotid bifurcation resulting in up to 60 % stenosis with respect to the distal vessel (coronal series 7, image 157). Tortuous left ICA just beyond the bulb with a mildly kinked appearance, otherwise negative to the skullbase.  Calcified plaque left  proximal subclavian artery without significant stenosis. Calcified plaque at the left vertebral artery origin without significant stenosis. Tortuous proximal left vertebral. Tortuous distal left vertebral, otherwise negative to the skullbase.  Review of the MIP images confirms the above findings.  IMPRESSION: 1. Dense bilateral carotid bifurcation calcified plaque, on the right resulting in high-grade stenosis of approximately 80% with respect to the distal vessel. 60% left ICA origin stenosis. 2. No vertebral artery stenosis. Negative for age intracranial circulation except for Mild dolichoectasia. 3. Negative CT appearance of the brain, no CT evidence of acute or subacute cortically based infarct.   Electronically Signed   By: Lars Pinks M.D.   On: 01/17/2014 16:21    Medications: Scheduled Meds: . aspirin  300 mg Rectal Daily   Or  . aspirin  325 mg Oral Daily  . Chlorhexidine Gluconate Cloth  6 each Topical Q0600  . enoxaparin (LOVENOX) injection  40 mg Subcutaneous Q24H  . insulin aspart  0-9 Units Subcutaneous TID WC  . levofloxacin  250 mg Oral QHS  . levothyroxine  125 mcg Oral QAC breakfast  . losartan  25 mg Oral Daily  . mupirocin ointment  1 application Nasal BID  . polyvinyl alcohol  1 drop Both Eyes BID  . pregabalin  75 mg Oral QHS  . simvastatin  40 mg Oral Daily  . vitamin B-12  2,500 mcg Oral Daily      LOS: 4 days   Ellenor Wisniewski M.D. [facility] 01/20/2014, 5:56 PM Pager: [phone]  If 7PM-7AM, please contact night-coverage [email or web address] Password [email or web address]

## 2014-01-21 DIAGNOSIS — N3000 Acute cystitis without hematuria: Secondary | ICD-10-CM

## 2014-01-21 LAB — BASIC METABOLIC PANEL
Anion gap: 12 (ref 5–15)
BUN: 15 mg/dL (ref 6–23)
CHLORIDE: 101 meq/L (ref 96–112)
CO2: 24 mEq/L (ref 19–32)
Calcium: 8.4 mg/dL (ref 8.4–10.5)
Creatinine, Ser: 0.72 mg/dL (ref 0.50–1.10)
GFR calc Af Amer: 86 mL/min — ABNORMAL LOW (ref >90)
GFR calc non Af Amer: 74 mL/min — ABNORMAL LOW (ref >90)
GLUCOSE: 140 mg/dL — AB (ref 70–99)
Potassium: 4.2 mEq/L (ref 3.7–5.3)
Sodium: 137 mEq/L (ref 137–147)

## 2014-01-21 LAB — CBC
HEMATOCRIT: 27.7 % — AB (ref 36.0–46.0)
Hemoglobin: 9.3 g/dL — ABNORMAL LOW (ref 12.0–15.0)
MCH: 31.2 pg (ref 26.0–34.0)
MCHC: 33.6 g/dL (ref 30.0–36.0)
MCV: 93 fL (ref 78.0–100.0)
Platelets: 206 10*3/uL (ref 150–400)
RBC: 2.98 MIL/uL — ABNORMAL LOW (ref 3.87–5.11)
RDW: 12.9 % (ref 11.5–15.5)
WBC: 6.7 10*3/uL (ref 4.0–10.5)

## 2014-01-21 LAB — GLUCOSE, CAPILLARY: GLUCOSE-CAPILLARY: 102 mg/dL — AB (ref 70–99)

## 2014-01-21 MED ORDER — LEVOFLOXACIN 250 MG PO TABS
250.0000 mg | ORAL_TABLET | Freq: Every day | ORAL | Status: DC
Start: 2014-01-21 — End: 2014-02-10

## 2014-01-21 MED ORDER — GUAIFENESIN-DM 100-10 MG/5ML PO SYRP: 15.0000 mL | ORAL_SOLUTION | ORAL | Status: DC | PRN

## 2014-01-21 MED ORDER — BENZONATATE 100 MG PO CAPS
100.0000 mg | ORAL_CAPSULE | Freq: Three times a day (TID) | ORAL | Status: DC | PRN
  Filled 2014-01-21: qty 1

## 2014-01-21 MED ORDER — DSS 100 MG PO CAPS: 100.0000 mg | ORAL_CAPSULE | Freq: Every day | ORAL | Status: DC | PRN

## 2014-01-21 MED ORDER — PANTOPRAZOLE SODIUM 40 MG PO TBEC: 40.0000 mg | DELAYED_RELEASE_TABLET | Freq: Every day | ORAL | Status: DC

## 2014-01-21 MED ORDER — GUAIFENESIN-DM 100-10 MG/5ML PO SYRP: 10.0000 mL | ORAL_SOLUTION | ORAL | Status: DC | PRN

## 2014-01-21 MED ORDER — BENZONATATE 100 MG PO CAPS: 100.0000 mg | ORAL_CAPSULE | Freq: Three times a day (TID) | ORAL | Status: DC | PRN

## 2014-01-21 MED ORDER — MENTHOL 3 MG MT LOZG
1.0000 | LOZENGE | OROMUCOSAL | Status: DC | PRN
  Filled 2014-01-21: qty 9

## 2014-01-21 MED ORDER — LEVOFLOXACIN 250 MG PO TABS: 250.0000 mg | ORAL_TABLET | Freq: Every day | ORAL | Status: DC

## 2014-01-21 MED ORDER — TRAMADOL-ACETAMINOPHEN 37.5-325 MG PO TABS: 1.0000 | ORAL_TABLET | Freq: Four times a day (QID) | ORAL | Status: DC | PRN

## 2014-01-21 NOTE — Progress Notes (Addendum)
    Subjective  - Doing well, no new complaints.  Objective 140/58 62 98 F (36.7 C) (Oral) 15 94%  Intake/Output Summary (Last 24 hours) at 01/21/14 0723 Last data filed at 01/21/14 0521  Gross per 24 hour  Intake   2090 ml  Output   1900 ml  Net    190 ml   No tongue deviation or facial droop Grip 5/5 equal bil. Palpable radial pulses Incision C/D/I without hematoma Speech clear  Assessment/Planning: POD #1 s/p right carotid endarterectomy Plan : ambulate, eat solid food and void independently.  If these goal are meet ok to Discharge home from our stand point. Thanks. F/U in 2 weeks with Dr. Kellie Simmering.  Appt sent to our office.     Kayla Davis [place] 01/21/2014 7:23 AM  Agree with above.  Kayla Davis for d/c today from vascular standpoint.   Kayla Mayo, MD, FACS Beeper [phone] 01/21/2014   Laboratory Lab Results:  Recent Labs  01/20/14 0630 01/21/14 0559  WBC 5.9 6.7  HGB 11.3* 9.3*  HCT 34.1* 27.7*  PLT 219 206   BMET  Recent Labs  01/20/14 0630 01/21/14 0559  NA 138 137  K 4.2 4.2  CL 103 101  CO2 22 24  GLUCOSE 103* 140*  BUN 19 15  CREATININE 0.76 0.72  CALCIUM 9.0 8.4    COAG Lab Results  Component Value Date   INR 1.04 02/11/2010   No results found for this basename: PTT

## 2014-01-21 NOTE — Discharge Summary (Signed)
Physician Discharge Summary  Patient ID: Kayla Davis MRN: [id number] DOB/AGE: 19-Dec-1922 80 y.o.  Admit date: 01/16/2014 Discharge date: 01/21/2014  Primary Care Physician:  [name], MD  Discharge Diagnoses:    . TIA with right sided blurry vision . Right carotid disease with 80% stenosis, status post right carotid endarterectomy  . Essential hypertension . Hypothyroidism . Dyslipidemia Urinary tract infection  Consults: Neurology/stroke service, Dr. Leonie Man                   Vascular surgery, Dr. Kellie Simmering   Recommendations for Outpatient Follow-up:  Followup with Dr. Kellie Simmering in 2 weeks  Allergies:   Allergies  Allergen Reactions  . Penicillins Anaphylaxis    REACTION: swells up all over body, pt states she almost died  . Alprazolam      REACTION: eyes swell up  . Bactrim [Sulfamethoxazole-Trimethoprim]     Unable to remember the side effects  . Ceclor [Cefaclor]     Unsure of the side effects  . Cephalexin     Unsure of the side effects  . Ciprofloxacin Itching and Swelling    Pt states that she tolerated po cipro well, but not the higher dose given IV.  [name] Clindamycin/Lincomycin     Upset stomach  . Hydrocodone     Unsure of reaction to med  . Noroxin [Norfloxacin]     Unsure of side effects  . Sulfonamide Derivatives     REACTION: itching  . Tetracyclines & Related     Unsure of the reaction to this medication     Discharge Medications:   Medication List         acetaminophen 500 MG tablet  Commonly known as:  TYLENOL  Take 500 mg by mouth every 6 (six) hours as needed for moderate pain.     aspirin 325 MG tablet  Take 325 mg by mouth every morning.     B-12 2500 MCG Tabs  Take 2,500 mcg by mouth daily.     benzonatate 100 MG capsule  Commonly known as:  TESSALON  Take 1 capsule (100 mg total) by mouth 3 (three) times daily as needed for cough.     cholecalciferol 1000 UNITS tablet  Commonly known as:  VITAMIN D  Take 1,000 Units by  mouth daily.     DSS 100 MG Caps  Take 100 mg by mouth daily as needed for mild constipation.     EYE VITAMINS PO  Take 1 tablet by mouth daily.     guaiFENesin-dextromethorphan 100-10 MG/5ML syrup  Commonly known as:  ROBITUSSIN DM  Take 10 mLs by mouth every 4 (four) hours as needed for cough.     hydroxypropyl methylcellulose / hypromellose 2.5 % ophthalmic solution  Commonly known as:  ISOPTO TEARS / GONIOVISC  Place 1 drop into both eyes 2 (two) times daily.     levofloxacin 250 MG tablet  Commonly known as:  LEVAQUIN  Take 1 tablet (250 mg total) by mouth daily. X 2 more days     levothyroxine 125 MCG tablet  Commonly known as:  SYNTHROID  Take 1 tablet (125 mcg total) by mouth daily before breakfast.     losartan-hydrochlorothiazide 50-12.5 MG per tablet  Commonly known as:  HYZAAR  Take 0.5 tablets by mouth every morning.     pantoprazole 40 MG tablet  Commonly known as:  PROTONIX  Take 1 tablet (40 mg total) by mouth daily. For acid-reflux     polyethylene glycol  packet  Commonly known as:  MIRALAX / GLYCOLAX  Take 17 g by mouth daily as needed for moderate constipation.     pregabalin 75 MG capsule  Commonly known as:  LYRICA  Take 1 capsule (75 mg total) by mouth at bedtime.     simvastatin 40 MG tablet  Commonly known as:  ZOCOR  Take 1 tablet (40 mg total) by mouth daily.     sodium chloride 0.65 % Soln nasal spray  Commonly known as:  OCEAN  Place 1 spray into both nostrils 2 (two) times daily as needed for congestion.     traMADol-acetaminophen 37.5-325 MG per tablet  Commonly known as:  ULTRACET  Take 1 tablet by mouth every 6 (six) hours as needed for moderate pain or severe pain.         Brief H and P: For complete details please refer to admission H and P, but in briefEvelyn D Davis is a 80 y.o. female with history of sick sinus syndrome status post pacemaker placement, hypertension, hyperlipidemia, previous history of TIA started  experiencing right-sided blurred vision around 6 PM on day of admission on 10/5. Patient did not have any associated headache difficulty speaking swallowing dizziness or any loss of function of upper or lower extremities. Patient's symptoms lasted for around 20 minutes and completely resolved. Patient's CT head done in the ER shows features concerning for acute CVA. On-call neurologist Dr. Nicole Kindred was consulted by the ED physician and patient was admitted for further management. Patient otherwise denied any chest pain or shortness of breath. Patient usually takes aspirin and has not taken last 2 days because patient was planning to go to orthopedic surgeon for possible epidural injection for low back pain.    Hospital Course:  Kayla Davis is an 80 y.o. female Mr. hypertension, hyperlipidemia, aortic valve disorder and osteoarthritis, who experienced blurring of vision involving right side at about 5:15 PM on 01/16/2014. Symptoms lasted about 15 minutes then resolved. There's been no recurrence. She has no history of stroke or TIA. Patient was off aspirin for 2 days in anticipation of steroid injection by orthopedist on 01/17/2014. CT scan of her head showed low density area involving the left occipital region suspicious for acute ischemic stroke. NIH stroke score was 1 for inconsistency with finger counting involving right visual field.  TIA with right-sided blurry vision: Symptoms resolved  - CT angiogram of the head and neck was negative for acute CVA. Unable to obtain MRI due to pacemaker. Patient was placed on aspirin 325 mg daily and continued on statins. Lipid panel showed HDL 68 LDL 82. 2-D echo showed no source of embolism. Hemoglobin A1c 6.3. - CTA head and neck showed 80% stenosis and right ICA, 60% in left. However carotid Doppler showed about 60% stenosis of the right and 1-39% on the left.  Vascular surgery was consulted and patient underwent right carotid endarterectomy on 10/9. Postop  she has done well and was cleared for discharge by vascular surgery. Patient is tolerating diet and eating without any difficulty.  Hypothyroidism  - continue Synthroid   UTI  - Urine culture and sensitivity showed more than 100,000 colonies of multiple bacterial morphotypes, continue Levaquin   Dyslipidemia On statin   History of pacemaker secondary to sick sinus syndrome    Day of Discharge BP 131/53  Pulse 75  Temp(Src) 98.2 F (36.8 C) (Oral)  Resp 17  Ht 5' (1.524 m)  Wt 70.3 kg (154 lb 15.7 oz)  BMI 30.27 kg/m2  SpO2 99%  Physical Exam: General: Alert and awake oriented x3 not in any acute distress. HEENT: anicteric sclera, pupils reactive to light and accommodation, right neck incision clean CVS: S1-S2 clear no murmur rubs or gallops Chest: clear to auscultation bilaterally, no wheezing rales or rhonchi Abdomen: soft nontender, nondistended, normal bowel sounds Extremities: no cyanosis, clubbing or edema noted bilaterally Neuro: Cranial nerves II-XII intact, no focal neurological deficits   The results of significant diagnostics from this hospitalization (including imaging, microbiology, ancillary and laboratory) are listed below for reference.    LAB RESULTS: Basic Metabolic Panel:  Recent Labs Lab 01/20/14 0630 01/21/14 0559  NA 138 137  K 4.2 4.2  CL 103 101  CO2 22 24  GLUCOSE 103* 140*  BUN 19 15  CREATININE 0.76 0.72  CALCIUM 9.0 8.4   Liver Function Tests:  Recent Labs Lab 01/17/14 0632  AST 19  ALT 13  ALKPHOS 70  BILITOT 0.3  PROT 6.3  ALBUMIN 3.3*   No results found for this basename: LIPASE, AMYLASE,  in the last 168 hours No results found for this basename: AMMONIA,  in the last 168 hours CBC:  Recent Labs Lab 01/17/14 0632 01/20/14 0630 01/21/14 0559  WBC 12.0* 5.9 6.7  NEUTROABS 10.3*  --   --   HGB 11.1* 11.3* 9.3*  HCT 33.5* 34.1* 27.7*  MCV 94.4 96.1 93.0  PLT 189 219 206   Cardiac Enzymes: No results found for  this basename: CKTOTAL, CKMB, CKMBINDEX, TROPONINI,  in the last 168 hours BNP: No components found with this basename: POCBNP,  CBG:  Recent Labs Lab 01/20/14 2201 01/21/14 0809  GLUCAP 322* 102*    Significant Diagnostic Studies:  Ct Angio Head W/cm &/or Wo Cm  01/17/2014   CLINICAL DATA:  80 year old female with right eye blurred vision of acute onset. Initial encounter. Possible acute left occiput infarct on non contrast head CT done 01/16/2014. Personal history of TIA. Initial encounter.  EXAM: CT ANGIOGRAPHY HEAD AND NECK  TECHNIQUE: Multidetector CT imaging of the head and neck was performed using the standard protocol during bolus administration of intravenous contrast. Multiplanar CT image reconstructions and MIPs were obtained to evaluate the vascular anatomy. Carotid stenosis measurements (when applicable) are obtained utilizing NASCET criteria, using the distal internal carotid diameter as the denominator.  CONTRAST:  58mL OMNIPAQUE IOHEXOL 350 MG/ML SOLN  COMPARISON:  Head CTs without contrast 01/16/2014 and earlier.  FINDINGS: CTA HEAD FINDINGS  Visualized paranasal sinuses and mastoids are clear. No acute osseous abnormality identified. No acute scalp soft tissue findings.  No evidence of cortically based acute infarction identified. Gray-white matter differentiation is within normal limits throughout the brain. No abnormal enhancement identified. No intracranial mass effect. No ventriculomegaly.  VASCULAR FINDINGS:  Major intracranial venous structures are enhancing.  Patent distal vertebral arteries with mild calcified plaque. Patent bilateral PICA origins. Patent vertebrobasilar junction. Mildly dolichoectatic basilar artery with calcified plaque but no stenosis. SCA and PCA origins are within normal limits. Posterior communicating arteries are diminutive or absent. Bilateral PCA branches are within normal limits.  Calcified plaque in both ICA siphons resulting in less than 50 %  stenosis with respect to the distal vessel. Normal ophthalmic artery origins. Patent carotid termini. MCA and ACA origins are within normal limits.  Diminutive or absent anterior communicating artery. Bilateral ACA branches are within normal limits. Small infundibula of the right anterior temporal arteries arising from the right M1 segment. Right MCA branches are  within normal limits. Left MCA branches are within normal limits.  Review of the MIP images confirms the above findings.  CTA NECK FINDINGS  Negative lung apices. Right chest pacemaker generator partially visible. No superior mediastinal lymphadenopathy. Negative thyroid, larynx, pharynx, parapharyngeal spaces, retropharyngeal space, sublingual space, submandibular glands, parotid glands. Postoperative changes to the globes. Visualized paranasal sinuses and mastoids are clear. Severe degenerative changes at the left TMJ. Multilevel degenerative changes in the cervical spine. No acute osseous abnormality identified. No cervical lymphadenopathy.  VASCULAR FINDINGS:  Three vessel arch configuration. Calcified arch atherosclerosis, mostly affecting the left subclavian artery.  Right CCA origin within normal limits. Short segment of the proximal right CCA is mildly obscured by dense venous contrast. Retropharyngeal course of the right CCA. Mild calcified plaque proximal to the bifurcation. At the right ICA bifurcation calcified plaque results in up to 80 % stenosis with respect to the distal vessel (sagittal series 8, image 77). Elsewhere, the cervical right ICA is normal.  Calcified plaque at the right subclavian artery origin not resulting in hemodynamically significant stenosis. Mild calcified plaque at the right vertebral artery origin without significant stenosis. Intermittently tortuous but otherwise negative cervical right vertebral artery.  Mild calcified plaque left CCA origin without stenosis. Partially retropharyngeal course of the left CCA. Mild  calcified plaque proximal to the left carotid bifurcation. Dense calcified plaque at the left carotid bifurcation resulting in up to 60 % stenosis with respect to the distal vessel (coronal series 7, image 157). Tortuous left ICA just beyond the bulb with a mildly kinked appearance, otherwise negative to the skullbase.  Calcified plaque left proximal subclavian artery without significant stenosis. Calcified plaque at the left vertebral artery origin without significant stenosis. Tortuous proximal left vertebral. Tortuous distal left vertebral, otherwise negative to the skullbase.  Review of the MIP images confirms the above findings.  IMPRESSION: 1. Dense bilateral carotid bifurcation calcified plaque, on the right resulting in high-grade stenosis of approximately 80% with respect to the distal vessel. 60% left ICA origin stenosis. 2. No vertebral artery stenosis. Negative for age intracranial circulation except for Mild dolichoectasia. 3. Negative CT appearance of the brain, no CT evidence of acute or subacute cortically based infarct.   Electronically Signed   By: Lars Pinks M.D.   On: 01/17/2014 16:21   Dg Chest 2 View  01/17/2014   CLINICAL DATA:  Stroke yesterday, personal history of pacemaker insertion, aortic valve disorder, hypertension, hypothyroidism, hyperlipidemia  EXAM: CHEST  2 VIEW  COMPARISON:  11/18/2013  FINDINGS: RIGHT subclavian transvenous pacemaker leads project at RIGHT atrium and RIGHT ventricle.  Enlargement of cardiac silhouette with pulmonary vascular congestion.  Calcified tortuous thoracic aorta.  Bibasilar atelectasis.  No gross failure or consolidation.  Mild bronchitic changes noted.  No pleural effusion or pneumothorax.  Multilevel endplate spur formation thoracic spine and diffuse osseous demineralization.  IMPRESSION: Enlargement of cardiac silhouette with pulmonary vascular congestion.  Bronchitic changes with bibasilar atelectasis.   Electronically Signed   By: Lavonia Dana M.D.    On: 01/17/2014 17:05   Ct Head Wo Contrast  01/16/2014   CLINICAL DATA:  Right eye blurred vision since 6 p.m. tonight.  EXAM: CT HEAD WITHOUT CONTRAST  TECHNIQUE: Contiguous axial images were obtained from the base of the skull through the vertex without intravenous contrast.  COMPARISON:  11/18/2013.  FINDINGS: Diffusely enlarged ventricles and subarachnoid spaces. Patchy white matter low density in both cerebral hemispheres. Suggestion of subtle low density involving the gray and white  matter in the left occipital lobe. No intracranial hemorrhage or mass lesion. Unremarkable bones and included paranasal sinuses. Bilateral vertebral and cavernous internal carotid artery atheromatous calcifications.  IMPRESSION: 1. Possible acute left occipital lobe infarct. 2. No intracranial hemorrhage. 3. Stable atrophy and chronic small vessel white matter ischemic changes. These results were called by telephone at the time of interpretation on 01/16/2014 at 9:41 pm to Dr. Pryor Curia , who verbally acknowledged these results.   Electronically Signed   By: Enrique Sack M.D.   On: 01/16/2014 21:43   Ct Angio Neck W/cm &/or Wo/cm  01/17/2014   CLINICAL DATA:  80 year old female with right eye blurred vision of acute onset. Initial encounter. Possible acute left occiput infarct on non contrast head CT done 01/16/2014. Personal history of TIA. Initial encounter.  EXAM: CT ANGIOGRAPHY HEAD AND NECK  TECHNIQUE: Multidetector CT imaging of the head and neck was performed using the standard protocol during bolus administration of intravenous contrast. Multiplanar CT image reconstructions and MIPs were obtained to evaluate the vascular anatomy. Carotid stenosis measurements (when applicable) are obtained utilizing NASCET criteria, using the distal internal carotid diameter as the denominator.  CONTRAST:  65mL OMNIPAQUE IOHEXOL 350 MG/ML SOLN  COMPARISON:  Head CTs without contrast 01/16/2014 and earlier.  FINDINGS: CTA HEAD  FINDINGS  Visualized paranasal sinuses and mastoids are clear. No acute osseous abnormality identified. No acute scalp soft tissue findings.  No evidence of cortically based acute infarction identified. Gray-white matter differentiation is within normal limits throughout the brain. No abnormal enhancement identified. No intracranial mass effect. No ventriculomegaly.  VASCULAR FINDINGS:  Major intracranial venous structures are enhancing.  Patent distal vertebral arteries with mild calcified plaque. Patent bilateral PICA origins. Patent vertebrobasilar junction. Mildly dolichoectatic basilar artery with calcified plaque but no stenosis. SCA and PCA origins are within normal limits. Posterior communicating arteries are diminutive or absent. Bilateral PCA branches are within normal limits.  Calcified plaque in both ICA siphons resulting in less than 50 % stenosis with respect to the distal vessel. Normal ophthalmic artery origins. Patent carotid termini. MCA and ACA origins are within normal limits.  Diminutive or absent anterior communicating artery. Bilateral ACA branches are within normal limits. Small infundibula of the right anterior temporal arteries arising from the right M1 segment. Right MCA branches are within normal limits. Left MCA branches are within normal limits.  Review of the MIP images confirms the above findings.  CTA NECK FINDINGS  Negative lung apices. Right chest pacemaker generator partially visible. No superior mediastinal lymphadenopathy. Negative thyroid, larynx, pharynx, parapharyngeal spaces, retropharyngeal space, sublingual space, submandibular glands, parotid glands. Postoperative changes to the globes. Visualized paranasal sinuses and mastoids are clear. Severe degenerative changes at the left TMJ. Multilevel degenerative changes in the cervical spine. No acute osseous abnormality identified. No cervical lymphadenopathy.  VASCULAR FINDINGS:  Three vessel arch configuration. Calcified  arch atherosclerosis, mostly affecting the left subclavian artery.  Right CCA origin within normal limits. Short segment of the proximal right CCA is mildly obscured by dense venous contrast. Retropharyngeal course of the right CCA. Mild calcified plaque proximal to the bifurcation. At the right ICA bifurcation calcified plaque results in up to 80 % stenosis with respect to the distal vessel (sagittal series 8, image 77). Elsewhere, the cervical right ICA is normal.  Calcified plaque at the right subclavian artery origin not resulting in hemodynamically significant stenosis. Mild calcified plaque at the right vertebral artery origin without significant stenosis. Intermittently tortuous but otherwise negative cervical  right vertebral artery.  Mild calcified plaque left CCA origin without stenosis. Partially retropharyngeal course of the left CCA. Mild calcified plaque proximal to the left carotid bifurcation. Dense calcified plaque at the left carotid bifurcation resulting in up to 60 % stenosis with respect to the distal vessel (coronal series 7, image 157). Tortuous left ICA just beyond the bulb with a mildly kinked appearance, otherwise negative to the skullbase.  Calcified plaque left proximal subclavian artery without significant stenosis. Calcified plaque at the left vertebral artery origin without significant stenosis. Tortuous proximal left vertebral. Tortuous distal left vertebral, otherwise negative to the skullbase.  Review of the MIP images confirms the above findings.  IMPRESSION: 1. Dense bilateral carotid bifurcation calcified plaque, on the right resulting in high-grade stenosis of approximately 80% with respect to the distal vessel. 60% left ICA origin stenosis. 2. No vertebral artery stenosis. Negative for age intracranial circulation except for Mild dolichoectasia. 3. Negative CT appearance of the brain, no CT evidence of acute or subacute cortically based infarct.   Electronically Signed   By: Lars Pinks M.D.   On: 01/17/2014 16:21    2D ECHO: Study Conclusions  - Left ventricle: The cavity size was normal. Wall thickness was increased in a pattern of mild LVH. The estimated ejection fraction was 60%. Wall motion was normal; there were no regional wall motion abnormalities. - Mitral valve: Calcified annulus. - Right ventricle: The cavity size was normal. Pacer wire or catheter noted in right ventricle. Systolic function was normal. - Pulmonary arteries: PA peak pressure: 33 mm Hg (S). - Impressions: No cardiac source of embolism was identified, but cannot be ruled out on the basis of this examination.     Disposition and Follow-up: Discharge Instructions   Diet Carb Modified    Complete by:  As directed      Increase activity slowly    Complete by:  As directed             DISPOSITION: Home  DIET: Heart healthy/carb modified    DISCHARGE FOLLOW-UP Follow-up Information   Follow up with SETHI,PRAMOD, MD. Schedule an appointment as soon as possible for a visit in 2 months. (for hospital follow-up)    Specialties:  Neurology, Radiology   Contact information:   [address] [place] [address] [phone]       Follow up with Tinnie Gens, MD In 2 weeks. (sent message to office)    Specialty:  Vascular Surgery   Contact information:   [address] [place] [address] [phone]       Follow up with [name], MD. Schedule an appointment as soon as possible for a visit in 2 weeks. (for hospital follow-up)    Specialty:  Internal Medicine   Contact information:   [place]  [phone]       Time spent on Discharge: 40 mins  Signed:   Arrion Broaddus M.D. [facility] 01/21/2014, 10:32 AM Pager: [phone]

## 2014-01-21 NOTE — Progress Notes (Signed)
Discharge instructions given. No questions or concerns at this time.  

## 2014-01-21 NOTE — Progress Notes (Signed)
Paged Dr. Tana Coast at [phone] to let her know vascular saw pt this AM and are ok with her going home.

## 2014-01-22 NOTE — Progress Notes (Signed)
CARE MANAGEMENT NOTE 01/22/2014  Patient:  ELMYRA, BANWART   Account Number:  [id number]  Date Initiated:  01/17/2014  Documentation initiated by:  Olga Coaster  Subjective/Objective Assessment:   ADMITTED WITH STROKE     Action/Plan:   CM FOLLOWING FOR DCP   Anticipated DC Date:  01/20/2014   Anticipated DC Plan:  [facility]  CM consult      Choice offered to / List presented to:             Status of service:  Completed, signed off Medicare Important Message given?  YES (If response is "NO", the following Medicare IM given date fields will be blank) Date Medicare IM given:  01/19/2014 Medicare IM given by:  Olga Coaster Date Additional Medicare IM given:   Additional Medicare IM given by:    Discharge Disposition:  HOME/SELF CARE  Per UR Regulation:  Reviewed for med. necessity/level of care/duration of stay  If discussed at [place] of Stay Meetings, dates discussed:    Comments:  10/6/2015Mindi Slicker RN,BSN,MHA [phone]

## 2014-01-23 ENCOUNTER — Telehealth: Payer: Self-pay | Admitting: *Deleted

## 2014-01-23 ENCOUNTER — Encounter (HOSPITAL_COMMUNITY): Payer: Self-pay | Admitting: Vascular Surgery

## 2014-01-23 NOTE — Telephone Encounter (Signed)
Patient seen Dr Leonie Man in the hospital, patient calling for 2 month stroke follow up, patient was scheduled for 03/31/14 at 11 am with Dr Erlinda Hong.

## 2014-01-23 NOTE — Telephone Encounter (Signed)
Transition Care Management Follow-up Telephone Call D/C 01/21/14  How have you been since you were released from the hospital? Called pt she stated she is doing alright   Do you understand why you were in the hospital? YES, she stated she understood why she was admitted  Do you understand the discharge instrcutions? YES, she stated the nurse went over with her at discharge, but we reviewed as well Items Reviewed:  Medications reviewed: YES, reviewed  Allergies reviewed: YES, reviewed  Dietary changes reviewed: NO  Referrals reviewed: No referrals needed   Functional Questionnaire:   Activities of Daily Living (ADLs):   She states she is  independent in the following: bathing and hygiene, dressing her self, walking, ect She states she doesn't  require assistance  Any transportation issues/concerns?: NO pt states her son will bring her to her appt  Any patient concerns? YES, she stated she haven't had a BM since she been home. She started taking miralax. Advise pt to continue taking the miralax daily if need to she can take the stool softener twice a day until she have a BM   Confirmed importance and date/time of follow-up visits scheduled: YES, advise pt to keep appt for 02/10/14 which she had already called and schedule with Dr. Alain Marion  Confirmed with patient if condition begins to worsen call PCP or go to the ER.  Patient was given the Call-a-Nurse line [phone]: YES

## 2014-01-29 DIAGNOSIS — M4317 Spondylolisthesis, lumbosacral region: Secondary | ICD-10-CM | POA: Diagnosis not present

## 2014-01-29 DIAGNOSIS — E119 Type 2 diabetes mellitus without complications: Secondary | ICD-10-CM | POA: Diagnosis not present

## 2014-01-29 DIAGNOSIS — G894 Chronic pain syndrome: Secondary | ICD-10-CM | POA: Diagnosis not present

## 2014-01-29 DIAGNOSIS — M7062 Trochanteric bursitis, left hip: Secondary | ICD-10-CM | POA: Diagnosis not present

## 2014-01-29 DIAGNOSIS — M4726 Other spondylosis with radiculopathy, lumbar region: Secondary | ICD-10-CM | POA: Diagnosis not present

## 2014-01-29 DIAGNOSIS — M4806 Spinal stenosis, lumbar region: Secondary | ICD-10-CM | POA: Diagnosis not present

## 2014-01-29 DIAGNOSIS — M1612 Unilateral primary osteoarthritis, left hip: Secondary | ICD-10-CM | POA: Diagnosis not present

## 2014-01-31 DIAGNOSIS — M7062 Trochanteric bursitis, left hip: Secondary | ICD-10-CM | POA: Diagnosis not present

## 2014-01-31 DIAGNOSIS — M4806 Spinal stenosis, lumbar region: Secondary | ICD-10-CM | POA: Diagnosis not present

## 2014-01-31 DIAGNOSIS — G894 Chronic pain syndrome: Secondary | ICD-10-CM | POA: Diagnosis not present

## 2014-01-31 DIAGNOSIS — M4317 Spondylolisthesis, lumbosacral region: Secondary | ICD-10-CM | POA: Diagnosis not present

## 2014-01-31 DIAGNOSIS — M4726 Other spondylosis with radiculopathy, lumbar region: Secondary | ICD-10-CM | POA: Diagnosis not present

## 2014-01-31 DIAGNOSIS — E119 Type 2 diabetes mellitus without complications: Secondary | ICD-10-CM | POA: Diagnosis not present

## 2014-02-02 DIAGNOSIS — G894 Chronic pain syndrome: Secondary | ICD-10-CM | POA: Diagnosis not present

## 2014-02-02 DIAGNOSIS — M4317 Spondylolisthesis, lumbosacral region: Secondary | ICD-10-CM | POA: Diagnosis not present

## 2014-02-02 DIAGNOSIS — M4726 Other spondylosis with radiculopathy, lumbar region: Secondary | ICD-10-CM | POA: Diagnosis not present

## 2014-02-02 DIAGNOSIS — M7062 Trochanteric bursitis, left hip: Secondary | ICD-10-CM | POA: Diagnosis not present

## 2014-02-02 DIAGNOSIS — E119 Type 2 diabetes mellitus without complications: Secondary | ICD-10-CM | POA: Diagnosis not present

## 2014-02-02 DIAGNOSIS — M4806 Spinal stenosis, lumbar region: Secondary | ICD-10-CM | POA: Diagnosis not present

## 2014-02-06 ENCOUNTER — Encounter: Payer: Self-pay | Admitting: Vascular Surgery

## 2014-02-06 DIAGNOSIS — E119 Type 2 diabetes mellitus without complications: Secondary | ICD-10-CM | POA: Diagnosis not present

## 2014-02-06 DIAGNOSIS — M7062 Trochanteric bursitis, left hip: Secondary | ICD-10-CM | POA: Diagnosis not present

## 2014-02-06 DIAGNOSIS — M4317 Spondylolisthesis, lumbosacral region: Secondary | ICD-10-CM | POA: Diagnosis not present

## 2014-02-06 DIAGNOSIS — M4726 Other spondylosis with radiculopathy, lumbar region: Secondary | ICD-10-CM | POA: Diagnosis not present

## 2014-02-06 DIAGNOSIS — G894 Chronic pain syndrome: Secondary | ICD-10-CM | POA: Diagnosis not present

## 2014-02-06 DIAGNOSIS — M4806 Spinal stenosis, lumbar region: Secondary | ICD-10-CM | POA: Diagnosis not present

## 2014-02-07 ENCOUNTER — Encounter: Payer: Self-pay | Admitting: Vascular Surgery

## 2014-02-07 ENCOUNTER — Ambulatory Visit (INDEPENDENT_AMBULATORY_CARE_PROVIDER_SITE_OTHER): Payer: Self-pay | Admitting: Vascular Surgery

## 2014-02-07 VITALS — BP 137/61 | HR 63 | Ht 60.0 in | Wt 152.6 lb

## 2014-02-07 DIAGNOSIS — Z48812 Encounter for surgical aftercare following surgery on the circulatory system: Secondary | ICD-10-CM

## 2014-02-07 DIAGNOSIS — I6529 Occlusion and stenosis of unspecified carotid artery: Secondary | ICD-10-CM | POA: Insufficient documentation

## 2014-02-07 NOTE — Addendum Note (Signed)
Addended by: Mena Goes on: 02/07/2014 03:34 PM   Modules accepted: Orders

## 2014-02-07 NOTE — Progress Notes (Signed)
Subjective:     Patient ID: Kayla Davis, female   DOB: 1922/04/28, 80 y.o.   MRN: [id number]  HPI this 80 year old female returns 2 weeks post right carotid endarterectomy for an ulcerative plaque right carotid artery with an episode of amaurosis fugax with a retinal embolus. She has done well since her surgery with no specific complaints. She denies lateralizing weakness, aphasia, amaurosis fugax, diplopia, blurred vision, or syncope. She is swallowing well and has no hoarseness. She is taking one aspirin per day.   Review of Systems     Objective:   Physical Exam BP 137/61  Pulse 63  Ht 5' (1.524 m)  Wt 152 lb 9.6 oz (69.219 kg)  BMI 29.80 kg/m2  SpO2 100%  General elderly female no apparent stress alert and oriented 3 Right neck incision is healed nicely with no swelling or erythema. 3+ pulse palpable with no audible bruit. Neurologic exam normal.     Assessment:     Doing well 2 weeks post right carotid endarterectomy for ulcerative plaque with amaurosis fugax-mild left ICA stenosis-asymptomatic    Plan:     Return in 6 months for carotid duplex exam

## 2014-02-09 DIAGNOSIS — M4317 Spondylolisthesis, lumbosacral region: Secondary | ICD-10-CM | POA: Diagnosis not present

## 2014-02-09 DIAGNOSIS — G894 Chronic pain syndrome: Secondary | ICD-10-CM | POA: Diagnosis not present

## 2014-02-09 DIAGNOSIS — M7062 Trochanteric bursitis, left hip: Secondary | ICD-10-CM | POA: Diagnosis not present

## 2014-02-09 DIAGNOSIS — E119 Type 2 diabetes mellitus without complications: Secondary | ICD-10-CM | POA: Diagnosis not present

## 2014-02-09 DIAGNOSIS — M4726 Other spondylosis with radiculopathy, lumbar region: Secondary | ICD-10-CM | POA: Diagnosis not present

## 2014-02-09 DIAGNOSIS — M4806 Spinal stenosis, lumbar region: Secondary | ICD-10-CM | POA: Diagnosis not present

## 2014-02-10 ENCOUNTER — Ambulatory Visit (INDEPENDENT_AMBULATORY_CARE_PROVIDER_SITE_OTHER): Payer: MEDICARE | Admitting: Internal Medicine

## 2014-02-10 ENCOUNTER — Encounter: Payer: Self-pay | Admitting: Internal Medicine

## 2014-02-10 ENCOUNTER — Other Ambulatory Visit (INDEPENDENT_AMBULATORY_CARE_PROVIDER_SITE_OTHER): Payer: MEDICARE

## 2014-02-10 VITALS — BP 122/50 | HR 63 | Temp 97.6°F | Ht 60.0 in | Wt 150.8 lb

## 2014-02-10 DIAGNOSIS — M4806 Spinal stenosis, lumbar region: Secondary | ICD-10-CM | POA: Diagnosis not present

## 2014-02-10 DIAGNOSIS — F411 Generalized anxiety disorder: Secondary | ICD-10-CM | POA: Diagnosis not present

## 2014-02-10 DIAGNOSIS — N390 Urinary tract infection, site not specified: Secondary | ICD-10-CM

## 2014-02-10 DIAGNOSIS — I6521 Occlusion and stenosis of right carotid artery: Secondary | ICD-10-CM | POA: Diagnosis not present

## 2014-02-10 DIAGNOSIS — M7062 Trochanteric bursitis, left hip: Secondary | ICD-10-CM | POA: Diagnosis not present

## 2014-02-10 DIAGNOSIS — G894 Chronic pain syndrome: Secondary | ICD-10-CM | POA: Diagnosis not present

## 2014-02-10 DIAGNOSIS — M4317 Spondylolisthesis, lumbosacral region: Secondary | ICD-10-CM | POA: Diagnosis not present

## 2014-02-10 DIAGNOSIS — M4726 Other spondylosis with radiculopathy, lumbar region: Secondary | ICD-10-CM | POA: Diagnosis not present

## 2014-02-10 DIAGNOSIS — E119 Type 2 diabetes mellitus without complications: Secondary | ICD-10-CM | POA: Diagnosis not present

## 2014-02-10 LAB — URINALYSIS, ROUTINE W REFLEX MICROSCOPIC
Bilirubin Urine: NEGATIVE
NITRITE: POSITIVE — AB
PH: 5.5 (ref 5.0–8.0)
SPECIFIC GRAVITY, URINE: 1.02 (ref 1.000–1.030)
Total Protein, Urine: NEGATIVE
Urine Glucose: NEGATIVE
Urobilinogen, UA: 0.2 (ref 0.0–1.0)

## 2014-02-10 MED ORDER — GLUCOSE BLOOD VI STRP: ORAL_STRIP | Status: DC

## 2014-02-10 MED ORDER — ONETOUCH ULTRASOFT LANCETS MISC: Status: DC

## 2014-02-10 NOTE — Progress Notes (Signed)
Subjective:    HPI  Admit date: 01/16/2014  Discharge date: 01/21/2014  Primary Care Physician: [name], MD  Discharge Diagnoses:   . TIA with right sided blurry vision  . Right carotid disease with 80% stenosis, status post right carotid endarterectomy 01/20/14 . Essential hypertension . Hypothyroidism . Dyslipidemia  Urinary tract infection  Consults: Neurology/stroke service, Dr. Leonie Man  Vascular surgery, Dr. Kellie Simmering  Recommendations for Outpatient Follow-up:  Followup with Dr. Kellie Simmering in 2 weeks    The patient presents for a follow-up of  chronic hypertension, TIA, LBP, gait disorder, anemia, spinal stenosis w/gait disorder, chronic dyslipidemia.  F/u recurrent UTI w/MRSA 06/21/13 Cx  S/p pacemaker  BP Readings from Last 3 Encounters:  02/10/14 122/50  02/07/14 137/61  01/21/14 131/53   Wt Readings from Last 3 Encounters:  02/10/14 150 lb 12 oz (68.38 kg)  02/07/14 152 lb 9.6 oz (69.219 kg)  01/20/14 154 lb 15.7 oz (70.3 kg)     Review of Systems  Constitutional: Negative for chills, activity change, appetite change, fatigue and unexpected weight change.  HENT: Negative for congestion, mouth sores, rhinorrhea, sinus pressure, sore throat, tinnitus and voice change.   Eyes: Negative for visual disturbance.  Respiratory: Negative for cough and chest tightness.   Gastrointestinal: Negative for nausea and abdominal pain.  Genitourinary: Negative for frequency, difficulty urinating and vaginal pain.  Musculoskeletal: Positive for arthralgias, back pain and gait problem. Negative for neck stiffness.  Skin: Negative for pallor and rash.  Neurological: Negative for dizziness, tremors, weakness, numbness and headaches.  Psychiatric/Behavioral: Negative for suicidal ideas, confusion, sleep disturbance and dysphoric mood.       Objective:   Physical Exam  Constitutional: She appears well-developed. No distress.  HENT:  Head: Normocephalic.  Right Ear:  External ear normal.  Left Ear: External ear normal.  Nose: Nose normal.  Mouth/Throat: Oropharynx is clear and moist.  Eyes: Conjunctivae are normal. Pupils are equal, round, and reactive to light. Right eye exhibits no discharge. Left eye exhibits no discharge.  Neck: Normal range of motion. Neck supple. No JVD present. No tracheal deviation present. No thyromegaly present.  Cardiovascular: Normal rate, regular rhythm and normal heart sounds.   Pulmonary/Chest: No stridor. No respiratory distress. She has no wheezes.  Abdominal: Soft. Bowel sounds are normal. She exhibits no distension and no mass. There is no tenderness. There is no rebound and no guarding.  Musculoskeletal: She exhibits tenderness. She exhibits no edema.  LS is stiff, painful  Lymphadenopathy:    She has no cervical adenopathy.  Neurological: She displays normal reflexes. No cranial nerve deficit. She exhibits normal muscle tone. Coordination normal.  Cane  Skin: No rash noted. No erythema.  Psychiatric: She has a normal mood and affect. Her behavior is normal. Judgment and thought content normal.  R neck scar  Lab Results  Component Value Date   WBC 6.7 01/21/2014   HGB 9.3* 01/21/2014   HCT 27.7* 01/21/2014   PLT 206 01/21/2014   GLUCOSE 140* 01/21/2014   CHOL 161 01/17/2014   TRIG 56 01/17/2014   HDL 68 01/17/2014   LDLDIRECT 143.0 10/07/2007   LDLCALC 82 01/17/2014   ALT 13 01/17/2014   AST 19 01/17/2014   NA 137 01/21/2014   K 4.2 01/21/2014   CL 101 01/21/2014   CREATININE 0.72 01/21/2014   BUN 15 01/21/2014   CO2 24 01/21/2014   TSH 1.440 01/17/2014   INR 1.04 02/11/2010   HGBA1C 6.3* 01/17/2014  Assessment & Plan:

## 2014-02-10 NOTE — Assessment & Plan Note (Addendum)
LABS Chronic sx's Use wet wipes PO abx prophylaxis discussed: may not be a good option due to allergies etc. UA

## 2014-02-10 NOTE — Assessment & Plan Note (Signed)
.   Right carotid disease with 80% stenosis, status post right carotid endarterectomy 01/20/14 Continue with current prescription therapy as reflected on the Med list.

## 2014-02-10 NOTE — Assessment & Plan Note (Signed)
Continue with current prescription therapy as reflected on the Med list.  

## 2014-02-13 DIAGNOSIS — M4726 Other spondylosis with radiculopathy, lumbar region: Secondary | ICD-10-CM | POA: Diagnosis not present

## 2014-02-13 DIAGNOSIS — M7062 Trochanteric bursitis, left hip: Secondary | ICD-10-CM | POA: Diagnosis not present

## 2014-02-13 DIAGNOSIS — G894 Chronic pain syndrome: Secondary | ICD-10-CM | POA: Diagnosis not present

## 2014-02-13 DIAGNOSIS — M4317 Spondylolisthesis, lumbosacral region: Secondary | ICD-10-CM | POA: Diagnosis not present

## 2014-02-13 DIAGNOSIS — M4806 Spinal stenosis, lumbar region: Secondary | ICD-10-CM | POA: Diagnosis not present

## 2014-02-13 DIAGNOSIS — E119 Type 2 diabetes mellitus without complications: Secondary | ICD-10-CM | POA: Diagnosis not present

## 2014-02-14 DIAGNOSIS — M4317 Spondylolisthesis, lumbosacral region: Secondary | ICD-10-CM | POA: Diagnosis not present

## 2014-02-14 DIAGNOSIS — M7062 Trochanteric bursitis, left hip: Secondary | ICD-10-CM | POA: Diagnosis not present

## 2014-02-14 DIAGNOSIS — E119 Type 2 diabetes mellitus without complications: Secondary | ICD-10-CM | POA: Diagnosis not present

## 2014-02-14 DIAGNOSIS — G894 Chronic pain syndrome: Secondary | ICD-10-CM | POA: Diagnosis not present

## 2014-02-14 DIAGNOSIS — M4726 Other spondylosis with radiculopathy, lumbar region: Secondary | ICD-10-CM | POA: Diagnosis not present

## 2014-02-14 DIAGNOSIS — M4806 Spinal stenosis, lumbar region: Secondary | ICD-10-CM | POA: Diagnosis not present

## 2014-02-16 DIAGNOSIS — M4806 Spinal stenosis, lumbar region: Secondary | ICD-10-CM | POA: Diagnosis not present

## 2014-02-16 DIAGNOSIS — M4726 Other spondylosis with radiculopathy, lumbar region: Secondary | ICD-10-CM | POA: Diagnosis not present

## 2014-02-16 DIAGNOSIS — E119 Type 2 diabetes mellitus without complications: Secondary | ICD-10-CM | POA: Diagnosis not present

## 2014-02-16 DIAGNOSIS — M4317 Spondylolisthesis, lumbosacral region: Secondary | ICD-10-CM | POA: Diagnosis not present

## 2014-02-16 DIAGNOSIS — G894 Chronic pain syndrome: Secondary | ICD-10-CM | POA: Diagnosis not present

## 2014-02-16 DIAGNOSIS — M7062 Trochanteric bursitis, left hip: Secondary | ICD-10-CM | POA: Diagnosis not present

## 2014-02-17 DIAGNOSIS — M4319 Spondylolisthesis, multiple sites in spine: Secondary | ICD-10-CM | POA: Diagnosis not present

## 2014-02-17 DIAGNOSIS — M1612 Unilateral primary osteoarthritis, left hip: Secondary | ICD-10-CM | POA: Diagnosis not present

## 2014-02-17 DIAGNOSIS — M25552 Pain in left hip: Secondary | ICD-10-CM | POA: Diagnosis not present

## 2014-02-21 DIAGNOSIS — M7062 Trochanteric bursitis, left hip: Secondary | ICD-10-CM | POA: Diagnosis not present

## 2014-02-21 DIAGNOSIS — M4806 Spinal stenosis, lumbar region: Secondary | ICD-10-CM | POA: Diagnosis not present

## 2014-02-21 DIAGNOSIS — E119 Type 2 diabetes mellitus without complications: Secondary | ICD-10-CM | POA: Diagnosis not present

## 2014-02-21 DIAGNOSIS — M4726 Other spondylosis with radiculopathy, lumbar region: Secondary | ICD-10-CM | POA: Diagnosis not present

## 2014-02-21 DIAGNOSIS — M4317 Spondylolisthesis, lumbosacral region: Secondary | ICD-10-CM | POA: Diagnosis not present

## 2014-02-21 DIAGNOSIS — G894 Chronic pain syndrome: Secondary | ICD-10-CM | POA: Diagnosis not present

## 2014-02-23 DIAGNOSIS — M7062 Trochanteric bursitis, left hip: Secondary | ICD-10-CM | POA: Diagnosis not present

## 2014-02-23 DIAGNOSIS — M4726 Other spondylosis with radiculopathy, lumbar region: Secondary | ICD-10-CM | POA: Diagnosis not present

## 2014-02-23 DIAGNOSIS — M4317 Spondylolisthesis, lumbosacral region: Secondary | ICD-10-CM | POA: Diagnosis not present

## 2014-02-23 DIAGNOSIS — G894 Chronic pain syndrome: Secondary | ICD-10-CM | POA: Diagnosis not present

## 2014-02-23 DIAGNOSIS — E119 Type 2 diabetes mellitus without complications: Secondary | ICD-10-CM | POA: Diagnosis not present

## 2014-02-23 DIAGNOSIS — M4806 Spinal stenosis, lumbar region: Secondary | ICD-10-CM | POA: Diagnosis not present

## 2014-02-24 DIAGNOSIS — M4806 Spinal stenosis, lumbar region: Secondary | ICD-10-CM | POA: Diagnosis not present

## 2014-02-24 DIAGNOSIS — M4317 Spondylolisthesis, lumbosacral region: Secondary | ICD-10-CM | POA: Diagnosis not present

## 2014-02-24 DIAGNOSIS — G894 Chronic pain syndrome: Secondary | ICD-10-CM | POA: Diagnosis not present

## 2014-02-24 DIAGNOSIS — M7062 Trochanteric bursitis, left hip: Secondary | ICD-10-CM | POA: Diagnosis not present

## 2014-02-24 DIAGNOSIS — E119 Type 2 diabetes mellitus without complications: Secondary | ICD-10-CM | POA: Diagnosis not present

## 2014-02-24 DIAGNOSIS — M4726 Other spondylosis with radiculopathy, lumbar region: Secondary | ICD-10-CM | POA: Diagnosis not present

## 2014-02-26 ENCOUNTER — Emergency Department (INDEPENDENT_AMBULATORY_CARE_PROVIDER_SITE_OTHER)
Admission: EM | Admit: 2014-02-26 | Discharge: 2014-02-26 | Disposition: A | Discharge status: Home / Self Care | Payer: MEDICARE | Source: Home / Self Care | Attending: Emergency Medicine | Admitting: Emergency Medicine

## 2014-02-26 ENCOUNTER — Encounter (HOSPITAL_COMMUNITY): Payer: Self-pay | Admitting: Emergency Medicine

## 2014-02-26 DIAGNOSIS — H00016 Hordeolum externum left eye, unspecified eyelid: Secondary | ICD-10-CM | POA: Diagnosis not present

## 2014-02-26 MED ORDER — ERYTHROMYCIN 5 MG/GM OP OINT
TOPICAL_OINTMENT | OPHTHALMIC | Status: DC
Start: 2014-02-26 — End: 2014-03-20

## 2014-02-26 NOTE — ED Provider Notes (Signed)
CSN: [id number]     Arrival date & time 02/26/14  0911 History   First MD Initiated Contact with Patient 02/26/14 0919     Chief Complaint  Patient presents with  . Eye Problem   (Consider location/radiation/quality/duration/timing/severity/associated sxs/prior Treatment) HPI Comments: Redness, tenderness and mild swelling to the inner canthus of left lower eye lid for 2-3 d ago.  Denies vision problems. No trauma   Past Medical History  Diagnosis Date  . Hypertension   . Hyperlipidemia   . Aortic valve disorders   . Unspecified hereditary and idiopathic peripheral neuropathy   . Other abnormal glucose   . Unspecified hearing loss   . Unspecified chronic bronchitis   . Unspecified hypothyroidism   . Unspecified gastritis and gastroduodenitis without mention of hemorrhage   . Diverticulosis of colon (without mention of hemorrhage)   . Irritable bowel syndrome   . Osteoarthrosis, unspecified whether generalized or localized, unspecified site   . Backache, unspecified   . Pain in limb   . Osteoporosis, unspecified   . Herpes zoster with other nervous system complications(053.19)   . Anxiety state, unspecified   . Anemia, unspecified   . Bradycardia     s/p PPM   Past Surgical History  Procedure Laterality Date  . Pacemaker insertion      MDT  . Appendectomy    . Cholecystectomy    . Tonsillectomy    . Vaginal hysterectomy    . Cataract extraction, bilateral  end of June 2013  . Cataract extraction, bilateral  beginning of July 2013  . Endarterectomy Right 01/20/2014    Procedure: RIGHT CAROTID ARTERY ENDARTERECTOMY WITH DACRON PATCH ANGIOPLASTY;  Surgeon: Mal Misty, MD;  Location: [name] OR;  Service: Vascular;  Laterality: Right;   Family History  Problem Relation Age of Onset  . Heart disease Father   . Cancer Paternal Grandmother   . Diabetes Father   . Heart disease Mother    History  Substance Use Topics  . Smoking status: Never Smoker   . Smokeless  tobacco: Never Used  . Alcohol Use: No   OB History    No data available     Review of Systems  Constitutional: Negative.   HENT: Negative.   Eyes: Positive for pain and redness. Negative for discharge and visual disturbance.    Allergies  Penicillins; Alprazolam; Bactrim; Ceclor; Cephalexin; Ciprofloxacin; Clindamycin/lincomycin; Hydrocodone; Noroxin; Sulfonamide derivatives; Tetracyclines & related; and Tramadol  Home Medications   Prior to Admission medications   Medication Sig Start Date End Date Taking? Authorizing Provider  acetaminophen (TYLENOL) 500 MG tablet Take 500 mg by mouth every 6 (six) hours as needed for moderate pain.    Historical Provider, MD  aspirin 325 MG tablet Take 325 mg by mouth every morning.     Historical Provider, MD  cholecalciferol (VITAMIN D) 1000 UNITS tablet Take 1,000 Units by mouth daily.      Historical Provider, MD  Cyanocobalamin (B-12) 2500 MCG TABS Take 2,500 mcg by mouth daily.     Historical Provider, MD  docusate sodium 100 MG CAPS Take 100 mg by mouth daily as needed for mild constipation. 01/21/14   Ripudeep Krystal Eaton, MD  erythromycin ophthalmic ointment Place a 1/2 inch ribbon of ointment into the left lower eyelid qid 02/26/14   Janne Napoleon, NP  glucose blood (ONE TOUCH TEST STRIPS) test strip Use as instructed 02/10/14   Aleksei Plotnikov V, MD  guaiFENesin-dextromethorphan (ROBITUSSIN DM) 100-10 MG/5ML syrup Take 10 mLs  by mouth every 4 (four) hours as needed for cough. 01/21/14   Ripudeep Krystal Eaton, MD  hydroxypropyl methylcellulose / hypromellose (ISOPTO TEARS / GONIOVISC) 2.5 % ophthalmic solution Place 1 drop into both eyes 2 (two) times daily.    Historical Provider, MD  Lancets [facility] ULTRASOFT) lancets Use as instructed 02/10/14   [name], MD  levothyroxine (SYNTHROID) 125 MCG tablet Take 1 tablet (125 mcg total) by mouth daily before breakfast. 10/28/13   Noralee Space, MD  losartan-hydrochlorothiazide (HYZAAR) 50-12.5  MG per tablet Take 0.5 tablets by mouth every morning.    Historical Provider, MD  pantoprazole (PROTONIX) 40 MG tablet Take 1 tablet (40 mg total) by mouth daily. For acid-reflux 01/21/14   Ripudeep Krystal Eaton, MD  polyethylene glycol (MIRALAX / GLYCOLAX) packet Take 17 g by mouth daily as needed for moderate constipation.  09/29/12   Marin Olp, MD  pregabalin (LYRICA) 75 MG capsule Take 1 capsule (75 mg total) by mouth at bedtime. 11/15/13 11/15/14  Aleksei Plotnikov V, MD  simvastatin (ZOCOR) 40 MG tablet Take 1 tablet (40 mg total) by mouth daily. 11/16/13   Aleksei Plotnikov V, MD  sodium chloride (OCEAN) 0.65 % SOLN nasal spray Place 1 spray into both nostrils 2 (two) times daily as needed for congestion.    Historical Provider, MD   BP 157/94 mmHg  Pulse 66  Temp(Src) 97.8 F (36.6 C) (Oral)  Resp 22  SpO2 97% Physical Exam  Constitutional: She is oriented to person, place, and time. She appears well-developed and well-nourished. No distress.  Eyes: Conjunctivae and EOM are normal. Pupils are equal, round, and reactive to light.  Hordeolum with small pustule cap to the inner/medial canthus of left lower lid. No drainage. Local redness extends approx 2 cm below the lower lid involving a small area   Neurological: She is alert and oriented to person, place, and time. She exhibits normal muscle tone.  Skin: Skin is warm and dry.  Psychiatric: She has a normal mood and affect.  Nursing note and vitals reviewed.   ED Course  Procedures (including critical care time) Labs Review Labs Reviewed - No data to display  Imaging Review No results found.   MDM   1. Hordeolum external, left    Erythromycin op oint  Warm compresses Written and oral onstructions    Janne Napoleon, NP 02/26/14 0945

## 2014-02-26 NOTE — ED Notes (Signed)
Redness left lower eye lid, swelling, and painful.  Patient does not know of any injury.

## 2014-02-26 NOTE — Discharge Instructions (Signed)

## 2014-02-27 DIAGNOSIS — H00014 Hordeolum externum left upper eyelid: Secondary | ICD-10-CM | POA: Diagnosis not present

## 2014-02-28 DIAGNOSIS — M1612 Unilateral primary osteoarthritis, left hip: Secondary | ICD-10-CM | POA: Diagnosis not present

## 2014-03-01 DIAGNOSIS — M4317 Spondylolisthesis, lumbosacral region: Secondary | ICD-10-CM | POA: Diagnosis not present

## 2014-03-01 DIAGNOSIS — E119 Type 2 diabetes mellitus without complications: Secondary | ICD-10-CM | POA: Diagnosis not present

## 2014-03-01 DIAGNOSIS — M7062 Trochanteric bursitis, left hip: Secondary | ICD-10-CM | POA: Diagnosis not present

## 2014-03-01 DIAGNOSIS — G894 Chronic pain syndrome: Secondary | ICD-10-CM | POA: Diagnosis not present

## 2014-03-01 DIAGNOSIS — M4726 Other spondylosis with radiculopathy, lumbar region: Secondary | ICD-10-CM | POA: Diagnosis not present

## 2014-03-01 DIAGNOSIS — M4806 Spinal stenosis, lumbar region: Secondary | ICD-10-CM | POA: Diagnosis not present

## 2014-03-02 DIAGNOSIS — M7062 Trochanteric bursitis, left hip: Secondary | ICD-10-CM | POA: Diagnosis not present

## 2014-03-02 DIAGNOSIS — M4317 Spondylolisthesis, lumbosacral region: Secondary | ICD-10-CM | POA: Diagnosis not present

## 2014-03-02 DIAGNOSIS — E119 Type 2 diabetes mellitus without complications: Secondary | ICD-10-CM | POA: Diagnosis not present

## 2014-03-02 DIAGNOSIS — M4806 Spinal stenosis, lumbar region: Secondary | ICD-10-CM | POA: Diagnosis not present

## 2014-03-02 DIAGNOSIS — G894 Chronic pain syndrome: Secondary | ICD-10-CM | POA: Diagnosis not present

## 2014-03-02 DIAGNOSIS — M4726 Other spondylosis with radiculopathy, lumbar region: Secondary | ICD-10-CM | POA: Diagnosis not present

## 2014-03-03 DIAGNOSIS — M7062 Trochanteric bursitis, left hip: Secondary | ICD-10-CM | POA: Diagnosis not present

## 2014-03-03 DIAGNOSIS — G894 Chronic pain syndrome: Secondary | ICD-10-CM | POA: Diagnosis not present

## 2014-03-03 DIAGNOSIS — M4317 Spondylolisthesis, lumbosacral region: Secondary | ICD-10-CM | POA: Diagnosis not present

## 2014-03-03 DIAGNOSIS — M4806 Spinal stenosis, lumbar region: Secondary | ICD-10-CM | POA: Diagnosis not present

## 2014-03-03 DIAGNOSIS — M4726 Other spondylosis with radiculopathy, lumbar region: Secondary | ICD-10-CM | POA: Diagnosis not present

## 2014-03-03 DIAGNOSIS — E119 Type 2 diabetes mellitus without complications: Secondary | ICD-10-CM | POA: Diagnosis not present

## 2014-03-06 DIAGNOSIS — M4317 Spondylolisthesis, lumbosacral region: Secondary | ICD-10-CM | POA: Diagnosis not present

## 2014-03-06 DIAGNOSIS — G894 Chronic pain syndrome: Secondary | ICD-10-CM | POA: Diagnosis not present

## 2014-03-06 DIAGNOSIS — E119 Type 2 diabetes mellitus without complications: Secondary | ICD-10-CM | POA: Diagnosis not present

## 2014-03-06 DIAGNOSIS — M4726 Other spondylosis with radiculopathy, lumbar region: Secondary | ICD-10-CM | POA: Diagnosis not present

## 2014-03-06 DIAGNOSIS — M7062 Trochanteric bursitis, left hip: Secondary | ICD-10-CM | POA: Diagnosis not present

## 2014-03-06 DIAGNOSIS — M4806 Spinal stenosis, lumbar region: Secondary | ICD-10-CM | POA: Diagnosis not present

## 2014-03-07 DIAGNOSIS — M7062 Trochanteric bursitis, left hip: Secondary | ICD-10-CM | POA: Diagnosis not present

## 2014-03-07 DIAGNOSIS — M4806 Spinal stenosis, lumbar region: Secondary | ICD-10-CM | POA: Diagnosis not present

## 2014-03-07 DIAGNOSIS — M4726 Other spondylosis with radiculopathy, lumbar region: Secondary | ICD-10-CM | POA: Diagnosis not present

## 2014-03-07 DIAGNOSIS — E119 Type 2 diabetes mellitus without complications: Secondary | ICD-10-CM | POA: Diagnosis not present

## 2014-03-07 DIAGNOSIS — M4317 Spondylolisthesis, lumbosacral region: Secondary | ICD-10-CM | POA: Diagnosis not present

## 2014-03-07 DIAGNOSIS — G894 Chronic pain syndrome: Secondary | ICD-10-CM | POA: Diagnosis not present

## 2014-03-08 DIAGNOSIS — G894 Chronic pain syndrome: Secondary | ICD-10-CM | POA: Diagnosis not present

## 2014-03-08 DIAGNOSIS — M7062 Trochanteric bursitis, left hip: Secondary | ICD-10-CM | POA: Diagnosis not present

## 2014-03-08 DIAGNOSIS — M4726 Other spondylosis with radiculopathy, lumbar region: Secondary | ICD-10-CM | POA: Diagnosis not present

## 2014-03-08 DIAGNOSIS — M4317 Spondylolisthesis, lumbosacral region: Secondary | ICD-10-CM | POA: Diagnosis not present

## 2014-03-08 DIAGNOSIS — M4806 Spinal stenosis, lumbar region: Secondary | ICD-10-CM | POA: Diagnosis not present

## 2014-03-08 DIAGNOSIS — E119 Type 2 diabetes mellitus without complications: Secondary | ICD-10-CM | POA: Diagnosis not present

## 2014-03-13 DIAGNOSIS — E119 Type 2 diabetes mellitus without complications: Secondary | ICD-10-CM | POA: Diagnosis not present

## 2014-03-13 DIAGNOSIS — M7062 Trochanteric bursitis, left hip: Secondary | ICD-10-CM | POA: Diagnosis not present

## 2014-03-13 DIAGNOSIS — M4317 Spondylolisthesis, lumbosacral region: Secondary | ICD-10-CM | POA: Diagnosis not present

## 2014-03-13 DIAGNOSIS — M4726 Other spondylosis with radiculopathy, lumbar region: Secondary | ICD-10-CM | POA: Diagnosis not present

## 2014-03-13 DIAGNOSIS — M4806 Spinal stenosis, lumbar region: Secondary | ICD-10-CM | POA: Diagnosis not present

## 2014-03-13 DIAGNOSIS — G894 Chronic pain syndrome: Secondary | ICD-10-CM | POA: Diagnosis not present

## 2014-03-16 DIAGNOSIS — M4317 Spondylolisthesis, lumbosacral region: Secondary | ICD-10-CM | POA: Diagnosis not present

## 2014-03-16 DIAGNOSIS — M7062 Trochanteric bursitis, left hip: Secondary | ICD-10-CM | POA: Diagnosis not present

## 2014-03-16 DIAGNOSIS — M4726 Other spondylosis with radiculopathy, lumbar region: Secondary | ICD-10-CM | POA: Diagnosis not present

## 2014-03-16 DIAGNOSIS — G894 Chronic pain syndrome: Secondary | ICD-10-CM | POA: Diagnosis not present

## 2014-03-16 DIAGNOSIS — E119 Type 2 diabetes mellitus without complications: Secondary | ICD-10-CM | POA: Diagnosis not present

## 2014-03-16 DIAGNOSIS — M4806 Spinal stenosis, lumbar region: Secondary | ICD-10-CM | POA: Diagnosis not present

## 2014-03-17 ENCOUNTER — Encounter: Payer: MEDICARE | Admitting: Internal Medicine

## 2014-03-17 DIAGNOSIS — M4726 Other spondylosis with radiculopathy, lumbar region: Secondary | ICD-10-CM | POA: Diagnosis not present

## 2014-03-17 DIAGNOSIS — M7062 Trochanteric bursitis, left hip: Secondary | ICD-10-CM | POA: Diagnosis not present

## 2014-03-17 DIAGNOSIS — G894 Chronic pain syndrome: Secondary | ICD-10-CM | POA: Diagnosis not present

## 2014-03-17 DIAGNOSIS — M4806 Spinal stenosis, lumbar region: Secondary | ICD-10-CM | POA: Diagnosis not present

## 2014-03-17 DIAGNOSIS — E119 Type 2 diabetes mellitus without complications: Secondary | ICD-10-CM | POA: Diagnosis not present

## 2014-03-17 DIAGNOSIS — M4317 Spondylolisthesis, lumbosacral region: Secondary | ICD-10-CM | POA: Diagnosis not present

## 2014-03-20 ENCOUNTER — Encounter: Payer: Self-pay | Admitting: Internal Medicine

## 2014-03-20 ENCOUNTER — Ambulatory Visit (INDEPENDENT_AMBULATORY_CARE_PROVIDER_SITE_OTHER): Payer: MEDICARE | Admitting: Internal Medicine

## 2014-03-20 VITALS — BP 136/72 | HR 66 | Ht 65.0 in | Wt 153.0 lb

## 2014-03-20 DIAGNOSIS — I495 Sick sinus syndrome: Secondary | ICD-10-CM

## 2014-03-20 DIAGNOSIS — I872 Venous insufficiency (chronic) (peripheral): Secondary | ICD-10-CM

## 2014-03-20 DIAGNOSIS — I10 Essential (primary) hypertension: Secondary | ICD-10-CM | POA: Diagnosis not present

## 2014-03-20 DIAGNOSIS — I6521 Occlusion and stenosis of right carotid artery: Secondary | ICD-10-CM

## 2014-03-20 LAB — MDC_IDC_ENUM_SESS_TYPE_INCLINIC
Brady Statistic AP VS Percent: 0 %
Brady Statistic AS VP Percent: 74 %
Date Time Interrogation Session: 20151207194036
Lead Channel Impedance Value: 486 Ohm
Lead Channel Pacing Threshold Amplitude: 0.75 V
Lead Channel Pacing Threshold Amplitude: 0.75 V
Lead Channel Pacing Threshold Pulse Width: 0.4 ms
Lead Channel Pacing Threshold Pulse Width: 0.4 ms
Lead Channel Sensing Intrinsic Amplitude: 15.67 mV
Lead Channel Setting Pacing Amplitude: 2 V
Lead Channel Setting Pacing Pulse Width: 0.4 ms
Lead Channel Setting Sensing Sensitivity: 5.6 mV
MDC IDC MSMT BATTERY IMPEDANCE: 228 Ohm
MDC IDC MSMT BATTERY REMAINING LONGEVITY: 108 mo
MDC IDC MSMT BATTERY VOLTAGE: 2.78 V
MDC IDC MSMT LEADCHNL RA SENSING INTR AMPL: 2.8 mV
MDC IDC MSMT LEADCHNL RV IMPEDANCE VALUE: 539 Ohm
MDC IDC SET LEADCHNL RV PACING AMPLITUDE: 2.5 V
MDC IDC STAT BRADY AP VP PERCENT: 26 %
MDC IDC STAT BRADY AS VS PERCENT: 0 %

## 2014-03-20 NOTE — Patient Instructions (Signed)
Your physician wants you to follow-up in: 6 months with Truitt Merle, NP and 12 months with Dr. Vallery Ridge will receive a reminder letter in the mail two months in advance. If you don't receive a letter, please call our office to schedule the follow-up appointment.  Remote monitoring is used to monitor your Pacemaker or ICD from home. This monitoring reduces the number of office visits required to check your device to one time per year. It allows [place] to keep an eye on the functioning of your device to ensure it is working properly. You are scheduled for a device check from home on 06/19/14. You may send your transmission at any time that day. If you have a wireless device, the transmission will be sent automatically. After your physician reviews your transmission, you will receive a postcard with your next transmission date.

## 2014-03-20 NOTE — Progress Notes (Signed)
PCP: [name], MD  Kayla Davis is a 80 y.o. female who presents today for routine electrophysiology followup.  Since last being seen in our clinic, the patient reports doing very well.  She is s/p R CEA after having visual changes in the setting of 80% CAS.  She is recovering well. Today, she denies symptoms of palpitations, chest pain, shortness of breath,  dizziness, presyncope, or syncope.  She has chronic pain and swelling in her L leg.  She has previously had a doppler 2012 which excluded DVT.  She has been advised to wear support hose for venous insufficiency by primary care but has not been compliant with this.  The patient is otherwise without complaint today.   Past Medical History  Diagnosis Date  . Hypertension   . Hyperlipidemia   . Aortic valve disorders   . Unspecified hereditary and idiopathic peripheral neuropathy   . Other abnormal glucose   . Unspecified hearing loss   . Unspecified chronic bronchitis   . Unspecified hypothyroidism   . Unspecified gastritis and gastroduodenitis without mention of hemorrhage   . Diverticulosis of colon (without mention of hemorrhage)   . Irritable bowel syndrome   . Osteoarthrosis, unspecified whether generalized or localized, unspecified site   . Backache, unspecified   . Pain in limb   . Osteoporosis, unspecified   . Herpes zoster with other nervous system complications(053.19)   . Anxiety state, unspecified   . Anemia, unspecified   . Bradycardia     s/p PPM   Past Surgical History  Procedure Laterality Date  . Pacemaker insertion      MDT  . Appendectomy    . Cholecystectomy    . Tonsillectomy    . Vaginal hysterectomy    . Cataract extraction, bilateral  end of June 2013  . Cataract extraction, bilateral  beginning of July 2013  . Endarterectomy Right 01/20/2014    Procedure: RIGHT CAROTID ARTERY ENDARTERECTOMY WITH DACRON PATCH ANGIOPLASTY;  Surgeon: Mal Misty, MD;  Location: [facility] OR;  Service: Vascular;   Laterality: Right;    Current Outpatient Prescriptions  Medication Sig Dispense Refill  . aspirin 325 MG tablet Take 325 mg by mouth every morning.     . cholecalciferol (VITAMIN D) 1000 UNITS tablet Take 1,000 Units by mouth daily.      . Cyanocobalamin (B-12) 2500 MCG TABS Take 2,500 mcg by mouth daily.     [name] docusate sodium 100 MG CAPS Take 100 mg by mouth daily as needed for mild constipation. 30 capsule 0  . glucose blood (ONE TOUCH TEST STRIPS) test strip Use as instructed 50 each 12  . hydroxypropyl methylcellulose / hypromellose (ISOPTO TEARS / GONIOVISC) 2.5 % ophthalmic solution Place 1 drop into both eyes 2 (two) times daily.    . Lancets (ONETOUCH ULTRASOFT) lancets Use as instructed 100 each 12  . levothyroxine (SYNTHROID) 125 MCG tablet Take 1 tablet (125 mcg total) by mouth daily before breakfast. 90 tablet 0  . losartan-hydrochlorothiazide (HYZAAR) 50-12.5 MG per tablet Take 0.5 tablets by mouth every morning.    . polyethylene glycol (MIRALAX / GLYCOLAX) packet Take 17 g by mouth daily as needed for moderate constipation.     . pregabalin (LYRICA) 75 MG capsule Take 1 capsule (75 mg total) by mouth at bedtime. 90 capsule 3  . simvastatin (ZOCOR) 40 MG tablet Take 1 tablet (40 mg total) by mouth daily. 90 tablet 3  . sodium chloride (OCEAN) 0.65 % SOLN nasal spray  Place 1 spray into both nostrils 2 (two) times daily as needed for congestion.    [name] acetaminophen (TYLENOL) 500 MG tablet Take 500 mg by mouth every 6 (six) hours as needed for moderate pain.    [name] guaiFENesin-dextromethorphan (ROBITUSSIN DM) 100-10 MG/5ML syrup Take 10 mLs by mouth every 4 (four) hours as needed for cough. (Patient not taking: Reported on 03/20/2014) 120 mL 0  . [DISCONTINUED] valsartan-hydrochlorothiazide (DIOVAN-HCT) 80-12.5 MG per tablet TAKE 1 TABLET BY MOUTH DAILY 90 tablet 3   No current facility-administered medications for this visit.    Physical Exam: Filed Vitals:   03/20/14 1439  BP:  136/72  Pulse: 66  Height: 5\' 5"  (1.651 m)  Weight: 153 lb (69.4 kg)    GEN- The patient is elderly appearing, alert and oriented x 3 today.   Head- normocephalic, atraumatic Neck- R CEA site is healing well Eyes-  Sclera clear, conjunctiva pink Ears- hearing intact Oropharynx- clear Lungs- Clear to ausculation bilaterally, normal work of breathing Chest- pacemaker pocket is well healed Heart- Regular rate and rhythm  GI- soft, NT, ND, + BS Extremities- no clubbing, cyanosis, +1 L leg edema   Pacemaker interrogation- reviewed in detail today,  See PACEART report  Assessment and Plan:  1. Sick sinus bradycardia Normal pacemaker function See Claudia Desanctis Art report Her intrinsic AV is 280 msec.  As she is 100% V paced, I have turned MVP on today to minimize V pacing.  She is aware that should she have problems that she should return.  2. Venous insufficiency Support hose encouraged  3. HTN Stable No change required today  Return to see Truitt Merle in 6 months I will see in a year carelink

## 2014-03-28 ENCOUNTER — Other Ambulatory Visit: Payer: Self-pay | Admitting: Pulmonary Disease

## 2014-03-31 ENCOUNTER — Ambulatory Visit (INDEPENDENT_AMBULATORY_CARE_PROVIDER_SITE_OTHER): Payer: MEDICARE | Admitting: Neurology

## 2014-03-31 ENCOUNTER — Encounter: Payer: Self-pay | Admitting: Neurology

## 2014-03-31 VITALS — BP 116/55 | HR 64 | Ht 65.0 in

## 2014-03-31 DIAGNOSIS — I6521 Occlusion and stenosis of right carotid artery: Secondary | ICD-10-CM | POA: Diagnosis not present

## 2014-03-31 DIAGNOSIS — Z9889 Other specified postprocedural states: Secondary | ICD-10-CM | POA: Diagnosis not present

## 2014-03-31 DIAGNOSIS — I10 Essential (primary) hypertension: Secondary | ICD-10-CM

## 2014-03-31 DIAGNOSIS — G453 Amaurosis fugax: Secondary | ICD-10-CM | POA: Diagnosis not present

## 2014-03-31 DIAGNOSIS — E785 Hyperlipidemia, unspecified: Secondary | ICD-10-CM | POA: Diagnosis not present

## 2014-03-31 NOTE — Patient Instructions (Signed)
-   continue ASA and zocor for stroke prevention - check BP and glucose at home - diet control for glucose - Follow up with your primary care physician for stroke risk factor modification. Recommend maintain blood pressure goal <130/80, diabetes with hemoglobin A1c goal below 6.5% and lipids with LDL cholesterol goal below 70 mg/dL.  - follow up with cardiology and vascular surgery - follow up 6 months

## 2014-03-31 NOTE — Progress Notes (Signed)
STROKE NEUROLOGY FOLLOW UP NOTE  NAME: Kayla Davis DOB: 12-23-1922  REASON FOR VISIT: stroke follow up HISTORY FROM: pt and chart  Today we had the pleasure of seeing Kayla Davis in follow-up at our Neurology Clinic. Pt was accompanied by daughter.   History Summary Kayla Davis is an 80 y.o. female with PMH of HTN, HLD, aortic valve disorder s/p pacemaker and osteoarthritis was admitted to [facility] on 01/16/14 with blurring of vision involving right side lasting about 15 minutes then resolved. She has no history of stroke or TIA. Patient was off aspirin for 2 days in anticipation of steroid injection by orthopedist on 01/17/2014. CTA showed 80% right high grade stenosis and 60% left ICA origin stenosis. CUS showed right 40-59% and left 1-39% ICA stenosis. Vascular surgery consulted and right CEA was done on 01/20/14. She recovered well from procedure. Followed up with vascular surgery and will do CUS in 6 months. She also followed up with Dr. [name] in Cardiology and pacemaker working fine.  Interval History During the interval time, the patient has been doing well. Wound healed well and no recurrent neuro symptoms. Her BP today 116/55. She still has left hip pain due to arthritis but she refused surgery and intermittently taking pain meds. She walks with walker.    REVIEW OF SYSTEMS: Full 14 system review of systems performed and notable only for those listed below and in HPI above, all others are negative:  Constitutional: N/A  Cardiovascular: N/A  Ear/Nose/Throat: ringing in ears  Skin: N/A  Eyes: N/A  Respiratory: N/A  Gastroitestinal: constipation  Genitourinary: N/A Hematology/Lymphatic: N/A  Endocrine: N/A  Musculoskeletal: joint pain, cramps, aching mucles  Allergy/Immunology: runny nose  Neurological: N/A  Psychiatric: N/A  The following represents the patient's updated allergies and side effects list: Allergies  Allergen Reactions  . Penicillins Anaphylaxis      REACTION: swells up all over body, pt states she almost died  . Alprazolam      REACTION: eyes swell up  . Bactrim [Sulfamethoxazole-Trimethoprim]     Unable to remember the side effects  . Ceclor [Cefaclor]     Unsure of the side effects  . Cephalexin     Unsure of the side effects  . Ciprofloxacin Itching and Swelling    Pt states that she tolerated po cipro well, but not the higher dose given IV.  [name] Clindamycin/Lincomycin     Upset stomach  . Hydrocodone     Unsure of reaction to med  . Noroxin [Norfloxacin]     Unsure of side effects  . Sulfonamide Derivatives     REACTION: itching  . Tetracyclines & Related     Unsure of the reaction to this medication  . Tramadol     Did not like    The neurologically relevant items on the patient's problem list were reviewed on today's visit.  Neurologic Examination  A problem focused neurological exam (12 or more points of the single system neurologic examination, vital signs counts as 1 point, cranial nerves count for 8 points) was performed.  Blood pressure 116/55, pulse 64, height 5\' 5"  (1.651 m).  General - Well nourished, well developed, in no apparent distress.  Ophthalmologic - not able to see through due to eye movement.  Cardiovascular - Regular rate and rhythm with no murmur.  Mental Status -  Level of arousal and orientation to time, place, and person were intact. Language including expression, naming, repetition, comprehension was assessed and found  intact.  Cranial Nerves II - XII - II - Visual field intact OU. III, IV, VI - Extraocular movements intact. V - Facial sensation intact bilaterally. VII - Facial movement intact bilaterally. VIII - Hearing & vestibular intact bilaterally. X - Palate elevates symmetrically. XI - Chin turning & shoulder shrug intact bilaterally. XII - Tongue protrusion intact.  Motor Strength - The patient's strength was normal in all extremities except left LE proximal strength  limited due to left hip pain and pronator drift was absent.  Bulk was normal and fasciculations were absent.   Motor Tone - Muscle tone was assessed at the neck and appendages and was normal.  Reflexes - The patient's reflexes were normal in all extremities and she had no pathological reflexes.  Sensory - Light touch, temperature/pinprick were assessed and were normal.    Coordination - The patient had normal movements in the hands with no ataxia or dysmetria.  Tremor was absent.  Gait and Station - walk with walker, antalgic gait on the left, slow and small stride.  Data reviewed: I personally reviewed the images and agree with the radiology interpretations.  CTA head and neck - 1. Dense bilateral carotid bifurcation calcified plaque, on the right resulting in high-grade stenosis of approximately 80% with respect to the distal vessel. 60% left ICA origin stenosis. 2. No vertebral artery stenosis. Negative for age intracranial circulation except for Mild dolichoectasia. 3. Negative CT appearance of the brain, no CT evidence of acute or subacute cortically based infarct. However, by my review of the above images, the right ICA 50% and right ICA 40% stenosis.  CUS -  - Right 40% to 59% ICA stenosis. Vertebral artery flow is antegrade. - Left - 1% to 39% ICA stenosis. Vertebral artery flow is antegrade.  2D echo - - Left ventricle: The cavity size was normal. Wall thickness was increased in a pattern of mild LVH. The estimated ejection fraction was 60%. Wall motion was normal; there were no regional wall motion abnormalities. - Mitral valve: Calcified annulus. - Right ventricle: The cavity size was normal. Pacer wire or catheter noted in right ventricle. Systolic function was normal. - Pulmonary arteries: PA peak pressure: 33 mm Hg (S). - Impressions: No cardiac source of embolism was identified, but cannot be ruled out on the basis of this  examination. Impressions: - No cardiac source of embolism was identified, but cannot be ruled out on the basis of this examination.  Component     Latest Ref Rng 01/17/2014  Cholesterol     0 - 200 mg/dL 161  Triglycerides     <150 mg/dL 56  HDL     >39 mg/dL 68  Total CHOL/HDL Ratio      2.4  VLDL     0 - 40 mg/dL 11  LDL (calc)     0 - 99 mg/dL 82  Hgb A1c MFr Bld     <5.7 % 6.3 (H)  Mean Plasma Glucose     <117 mg/dL 134 (H)  TSH     0.350 - 4.500 uIU/mL 1.440    Assessment: As you may recall, she is a 80 y.o. Caucasian female with PMH of HTN, HLD, aortic valve disorder s/p pacemaker and osteoarthritis was admitted to [facility] on 01/16/14 for amaurosis fugax on the right eye, resolved in 15 min. Subsequent CTA showed right 80% and left 60% ICA stenosis although by my read right about 50% stenosis. CUS consistent with my read. Pt had right CEA with  Dr. Kellie Simmering from vascular surgery and pt doing well post op. Currently on full dose ASA and statin for stroke prevention.   Plan:  - continue ASA and zocor for stroke prevention - check BP and glucose at home - Follow up with your primary care physician for stroke risk factor modification. Recommend maintain blood pressure goal <130/80, diabetes with hemoglobin A1c goal below 6.5% and lipids with LDL cholesterol goal below 70 mg/dL.  - follow up with vascular surgery and cardiology - RTC in 6 months.  No orders of the defined types were placed in this encounter.    No orders of the defined types were placed in this encounter.    Patient Instructions  - continue ASA and zocor for stroke prevention - check BP and glucose at home - diet control for glucose - Follow up with your primary care physician for stroke risk factor modification. Recommend maintain blood pressure goal <130/80, diabetes with hemoglobin A1c goal below 6.5% and lipids with LDL cholesterol goal below 70 mg/dL.  - follow up with cardiology and vascular surgery -  follow up 6 months   Rosalin Hawking, MD PhD [facility] [address], [place], [place] [address] [phone]

## 2014-04-21 ENCOUNTER — Ambulatory Visit (INDEPENDENT_AMBULATORY_CARE_PROVIDER_SITE_OTHER): Payer: MEDICARE | Admitting: Internal Medicine

## 2014-04-21 ENCOUNTER — Other Ambulatory Visit (INDEPENDENT_AMBULATORY_CARE_PROVIDER_SITE_OTHER): Payer: MEDICARE

## 2014-04-21 ENCOUNTER — Encounter: Payer: Self-pay | Admitting: Internal Medicine

## 2014-04-21 VITALS — BP 112/68 | HR 71 | Temp 97.4°F | Ht 60.0 in | Wt 152.0 lb

## 2014-04-21 DIAGNOSIS — Z0000 Encounter for general adult medical examination without abnormal findings: Secondary | ICD-10-CM | POA: Diagnosis not present

## 2014-04-21 DIAGNOSIS — E785 Hyperlipidemia, unspecified: Secondary | ICD-10-CM | POA: Diagnosis not present

## 2014-04-21 DIAGNOSIS — D6489 Other specified anemias: Secondary | ICD-10-CM

## 2014-04-21 DIAGNOSIS — Z23 Encounter for immunization: Secondary | ICD-10-CM

## 2014-04-21 DIAGNOSIS — I63519 Cerebral infarction due to unspecified occlusion or stenosis of unspecified middle cerebral artery: Secondary | ICD-10-CM | POA: Diagnosis not present

## 2014-04-21 LAB — BASIC METABOLIC PANEL
BUN: 27 mg/dL — ABNORMAL HIGH (ref 6–23)
CHLORIDE: 103 meq/L (ref 96–112)
CO2: 27 meq/L (ref 19–32)
CREATININE: 0.9 mg/dL (ref 0.4–1.2)
Calcium: 9.4 mg/dL (ref 8.4–10.5)
GFR: 65.11 mL/min (ref >60.00)
GLUCOSE: 99 mg/dL (ref 70–99)
Potassium: 4.6 mEq/L (ref 3.5–5.1)
SODIUM: 139 meq/L (ref 135–145)

## 2014-04-21 LAB — URINALYSIS, ROUTINE W REFLEX MICROSCOPIC
Bilirubin Urine: NEGATIVE
KETONES UR: NEGATIVE
Nitrite: POSITIVE — AB
SPECIFIC GRAVITY, URINE: 1.02 (ref 1.000–1.030)
TOTAL PROTEIN, URINE-UPE24: NEGATIVE
URINE GLUCOSE: NEGATIVE
UROBILINOGEN UA: 0.2 (ref 0.0–1.0)
pH: 6 (ref 5.0–8.0)

## 2014-04-21 LAB — CBC WITH DIFFERENTIAL/PLATELET
BASOS ABS: 0.1 10*3/uL (ref 0.0–0.1)
Basophils Relative: 1 % (ref 0.0–3.0)
Eosinophils Absolute: 0.3 10*3/uL (ref 0.0–0.7)
Eosinophils Relative: 3.2 % (ref 0.0–5.0)
HCT: 37.5 % (ref 36.0–46.0)
Hemoglobin: 12.4 g/dL (ref 12.0–15.0)
LYMPHS ABS: 2 10*3/uL (ref 0.7–4.0)
Lymphocytes Relative: 24.7 % (ref 12.0–46.0)
MCHC: 33.1 g/dL (ref 30.0–36.0)
MCV: 93.8 fl (ref 78.0–100.0)
MONO ABS: 0.6 10*3/uL (ref 0.1–1.0)
MONOS PCT: 6.9 % (ref 3.0–12.0)
NEUTROS ABS: 5.3 10*3/uL (ref 1.4–7.7)
Neutrophils Relative %: 64.2 % (ref 43.0–77.0)
PLATELETS: 226 10*3/uL (ref 150.0–400.0)
RBC: 4 Mil/uL (ref 3.87–5.11)
RDW: 13.3 % (ref 11.5–15.5)
WBC: 8.2 10*3/uL (ref 4.0–10.5)

## 2014-04-21 LAB — HEPATIC FUNCTION PANEL
ALT: 19 U/L (ref 0–35)
AST: 24 U/L (ref 0–37)
Albumin: 4 g/dL (ref 3.5–5.2)
Alkaline Phosphatase: 86 U/L (ref 39–117)
Bilirubin, Direct: 0.1 mg/dL (ref 0.0–0.3)
TOTAL PROTEIN: 6.8 g/dL (ref 6.0–8.3)
Total Bilirubin: 0.4 mg/dL (ref 0.2–1.2)

## 2014-04-21 LAB — IBC PANEL
Iron: 64 ug/dL (ref 42–145)
Saturation Ratios: 16.9 % — ABNORMAL LOW (ref 20.0–50.0)
Transferrin: 269.9 mg/dL (ref 212.0–360.0)

## 2014-04-21 LAB — TSH: TSH: 0.7 u[IU]/mL (ref 0.35–4.50)

## 2014-04-21 MED ORDER — TRIAMCINOLONE ACETONIDE 0.5 % EX CREA: 1.0000 "application " | TOPICAL_CREAM | Freq: Three times a day (TID) | CUTANEOUS | Status: DC

## 2014-04-21 NOTE — Progress Notes (Signed)
   Subjective:    HPI   The patient is here for a wellness exam.  The patient presents for a follow-up of  chronic hypertension, TIA, LBP, gait disorder, anemia, spinal stenosis w/gait disorder, chronic dyslipidemia.  F/u recurrent UTI w/MRSA 06/21/13 Cx  S/p pacemaker  BP Readings from Last 3 Encounters:  04/21/14 112/68  03/31/14 116/55  03/20/14 136/72   Wt Readings from Last 3 Encounters:  04/21/14 152 lb (68.947 kg)  03/20/14 153 lb (69.4 kg)  02/10/14 150 lb 12 oz (68.38 kg)     Review of Systems  Constitutional: Negative for chills, activity change, appetite change, fatigue and unexpected weight change.  HENT: Negative for congestion, mouth sores, rhinorrhea, sinus pressure, sore throat, tinnitus and voice change.   Eyes: Negative for visual disturbance.  Respiratory: Negative for cough and chest tightness.   Gastrointestinal: Negative for nausea and abdominal pain.  Genitourinary: Negative for frequency, difficulty urinating and vaginal pain.  Musculoskeletal: Positive for back pain, arthralgias and gait problem. Negative for neck stiffness.  Skin: Negative for pallor and rash.  Neurological: Negative for dizziness, tremors, weakness, numbness and headaches.  Psychiatric/Behavioral: Negative for suicidal ideas, confusion, sleep disturbance and dysphoric mood.       Objective:   Physical Exam  Constitutional: She appears well-developed. No distress.  HENT:  Head: Normocephalic.  Right Ear: External ear normal.  Left Ear: External ear normal.  Nose: Nose normal.  Mouth/Throat: Oropharynx is clear and moist.  Eyes: Conjunctivae are normal. Pupils are equal, round, and reactive to light. Right eye exhibits no discharge. Left eye exhibits no discharge.  Neck: Normal range of motion. Neck supple. No JVD present. No tracheal deviation present. No thyromegaly present.  Cardiovascular: Normal rate, regular rhythm and normal heart sounds.   Pulmonary/Chest: No  stridor. No respiratory distress. She has no wheezes.  Abdominal: Soft. Bowel sounds are normal. She exhibits no distension and no mass. There is no tenderness. There is no rebound and no guarding.  Musculoskeletal: She exhibits tenderness. She exhibits no edema.  Lymphadenopathy:    She has no cervical adenopathy.  Neurological: She displays normal reflexes. No cranial nerve deficit. She exhibits normal muscle tone. Coordination abnormal.  Skin: No rash noted. No erythema.  Psychiatric: She has a normal mood and affect. Her behavior is normal. Judgment and thought content normal.  Walker Dry skin on back and moles  Lab Results  Component Value Date   WBC 6.7 01/21/2014   HGB 9.3* 01/21/2014   HCT 27.7* 01/21/2014   PLT 206 01/21/2014   GLUCOSE 140* 01/21/2014   CHOL 161 01/17/2014   TRIG 56 01/17/2014   HDL 68 01/17/2014   LDLDIRECT 143.0 10/07/2007   LDLCALC 82 01/17/2014   ALT 13 01/17/2014   AST 19 01/17/2014   NA 137 01/21/2014   K 4.2 01/21/2014   CL 101 01/21/2014   CREATININE 0.72 01/21/2014   BUN 15 01/21/2014   CO2 24 01/21/2014   TSH 1.440 01/17/2014   INR 1.04 02/11/2010   HGBA1C 6.3* 01/17/2014          Assessment & Plan:

## 2014-04-21 NOTE — Assessment & Plan Note (Signed)
Labs

## 2014-04-21 NOTE — Patient Instructions (Signed)
Preventive Care for Adults A healthy lifestyle and preventive care can promote health and wellness. Preventive health guidelines for women include the following key practices.  A routine yearly physical is a good way to check with your health care provider about your health and preventive screening. It is a chance to share any concerns and updates on your health and to receive a thorough exam.  Visit your dentist for a routine exam and preventive care every 6 months. Brush your teeth twice a day and floss once a day. Good oral hygiene prevents tooth decay and gum disease.  The frequency of eye exams is based on your age, health, family medical history, use of contact lenses, and other factors. Follow your health care provider's recommendations for frequency of eye exams.  Eat a healthy diet. Foods like vegetables, fruits, whole grains, low-fat dairy products, and lean protein foods contain the nutrients you need without too many calories. Decrease your intake of foods high in solid fats, added sugars, and salt. Eat the right amount of calories for you.Get information about a proper diet from your health care provider, if necessary.  Regular physical exercise is one of the most important things you can do for your health. Most adults should get at least 150 minutes of moderate-intensity exercise (any activity that increases your heart rate and causes you to sweat) each week. In addition, most adults need muscle-strengthening exercises on 2 or more days a week.  Maintain a healthy weight. The body mass index (BMI) is a screening tool to identify possible weight problems. It provides an estimate of body fat based on height and weight. Your health care provider can find your BMI and can help you achieve or maintain a healthy weight.For adults 20 years and older:  A BMI below 18.5 is considered underweight.  A BMI of 18.5 to 24.9 is normal.  A BMI of 25 to 29.9 is considered overweight.  A BMI of  30 and above is considered obese.  Maintain normal blood lipids and cholesterol levels by exercising and minimizing your intake of saturated fat. Eat a balanced diet with plenty of fruit and vegetables. Blood tests for lipids and cholesterol should begin at age 76 and be repeated every 5 years. If your lipid or cholesterol levels are high, you are over 50, or you are at high risk for heart disease, you may need your cholesterol levels checked more frequently.Ongoing high lipid and cholesterol levels should be treated with medicines if diet and exercise are not working.  If you smoke, find out from your health care provider how to quit. If you do not use tobacco, do not start.  Lung cancer screening is recommended for adults aged 22-80 years who are at high risk for developing lung cancer because of a history of smoking. A yearly low-dose CT scan of the lungs is recommended for people who have at least a 30-pack-year history of smoking and are a current smoker or have quit within the past 15 years. A pack year of smoking is smoking an average of 1 pack of cigarettes a day for 1 year (for example: 1 pack a day for 30 years or 2 packs a day for 15 years). Yearly screening should continue until the smoker has stopped smoking for at least 15 years. Yearly screening should be stopped for people who develop a health problem that would prevent them from having lung cancer treatment.  If you are pregnant, do not drink alcohol. If you are breastfeeding,  be very cautious about drinking alcohol. If you are not pregnant and choose to drink alcohol, do not have more than 1 drink per day. One drink is considered to be 12 ounces (355 mL) of beer, 5 ounces (148 mL) of wine, or 1.5 ounces (44 mL) of liquor.  Avoid use of street drugs. Do not share needles with anyone. Ask for help if you need support or instructions about stopping the use of drugs.  High blood pressure causes heart disease and increases the risk of  stroke. Your blood pressure should be checked at least every 1 to 2 years. Ongoing high blood pressure should be treated with medicines if weight loss and exercise do not work.  If you are 75-52 years old, ask your health care provider if you should take aspirin to prevent strokes.  Diabetes screening involves taking a blood sample to check your fasting blood sugar level. This should be done once every 3 years, after age 15, if you are within normal weight and without risk factors for diabetes. Testing should be considered at a younger age or be carried out more frequently if you are overweight and have at least 1 risk factor for diabetes.  Breast cancer screening is essential preventive care for women. You should practice "breast self-awareness." This means understanding the normal appearance and feel of your breasts and may include breast self-examination. Any changes detected, no matter how small, should be reported to a health care provider. Women in their 58s and 30s should have a clinical breast exam (CBE) by a health care provider as part of a regular health exam every 1 to 3 years. After age 16, women should have a CBE every year. Starting at age 53, women should consider having a mammogram (breast X-ray test) every year. Women who have a family history of breast cancer should talk to their health care provider about genetic screening. Women at a high risk of breast cancer should talk to their health care providers about having an MRI and a mammogram every year.  Breast cancer gene (BRCA)-related cancer risk assessment is recommended for women who have family members with BRCA-related cancers. BRCA-related cancers include breast, ovarian, tubal, and peritoneal cancers. Having family members with these cancers may be associated with an increased risk for harmful changes (mutations) in the breast cancer genes BRCA1 and BRCA2. Results of the assessment will determine the need for genetic counseling and  BRCA1 and BRCA2 testing.  Routine pelvic exams to screen for cancer are no longer recommended for nonpregnant women who are considered low risk for cancer of the pelvic organs (ovaries, uterus, and vagina) and who do not have symptoms. Ask your health care provider if a screening pelvic exam is right for you.  If you have had past treatment for cervical cancer or a condition that could lead to cancer, you need Pap tests and screening for cancer for at least 20 years after your treatment. If Pap tests have been discontinued, your risk factors (such as having a new sexual partner) need to be reassessed to determine if screening should be resumed. Some women have medical problems that increase the chance of getting cervical cancer. In these cases, your health care provider may recommend more frequent screening and Pap tests.  The HPV test is an additional test that may be used for cervical cancer screening. The HPV test looks for the virus that can cause the cell changes on the cervix. The cells collected during the Pap test can be  tested for HPV. The HPV test could be used to screen women aged 30 years and older, and should be used in women of any age who have unclear Pap test results. After the age of 30, women should have HPV testing at the same frequency as a Pap test.  Colorectal cancer can be detected and often prevented. Most routine colorectal cancer screening begins at the age of 50 years and continues through age 75 years. However, your health care provider may recommend screening at an earlier age if you have risk factors for colon cancer. On a yearly basis, your health care provider may provide home test kits to check for hidden blood in the stool. Use of a small camera at the end of a tube, to directly examine the colon (sigmoidoscopy or colonoscopy), can detect the earliest forms of colorectal cancer. Talk to your health care provider about this at age 50, when routine screening begins. Direct  exam of the colon should be repeated every 5-10 years through age 75 years, unless early forms of pre-cancerous polyps or small growths are found.  People who are at an increased risk for hepatitis B should be screened for this virus. You are considered at high risk for hepatitis B if:  You were born in a country where hepatitis B occurs often. Talk with your health care provider about which countries are considered high risk.  Your parents were born in a high-risk country and you have not received a shot to protect against hepatitis B (hepatitis B vaccine).  You have HIV or AIDS.  You use needles to inject street drugs.  You live with, or have sex with, someone who has hepatitis B.  You get hemodialysis treatment.  You take certain medicines for conditions like cancer, organ transplantation, and autoimmune conditions.  Hepatitis C blood testing is recommended for all people born from 1945 through 1965 and any individual with known risks for hepatitis C.  Practice safe sex. Use condoms and avoid high-risk sexual practices to reduce the spread of sexually transmitted infections (STIs). STIs include gonorrhea, chlamydia, syphilis, trichomonas, herpes, HPV, and human immunodeficiency virus (HIV). Herpes, HIV, and HPV are viral illnesses that have no cure. They can result in disability, cancer, and death.  You should be screened for sexually transmitted illnesses (STIs) including gonorrhea and chlamydia if:  You are sexually active and are younger than 24 years.  You are older than 24 years and your health care provider tells you that you are at risk for this type of infection.  Your sexual activity has changed since you were last screened and you are at an increased risk for chlamydia or gonorrhea. Ask your health care provider if you are at risk.  If you are at risk of being infected with HIV, it is recommended that you take a prescription medicine daily to prevent HIV infection. This is  called preexposure prophylaxis (PrEP). You are considered at risk if:  You are a heterosexual woman, are sexually active, and are at increased risk for HIV infection.  You take drugs by injection.  You are sexually active with a partner who has HIV.  Talk with your health care provider about whether you are at high risk of being infected with HIV. If you choose to begin PrEP, you should first be tested for HIV. You should then be tested every 3 months for as long as you are taking PrEP.  Osteoporosis is a disease in which the bones lose minerals and strength   with aging. This can result in serious bone fractures or breaks. The risk of osteoporosis can be identified using a bone density scan. Women ages 65 years and over and women at risk for fractures or osteoporosis should discuss screening with their health care providers. Ask your health care provider whether you should take a calcium supplement or vitamin D to reduce the rate of osteoporosis.  Menopause can be associated with physical symptoms and risks. Hormone replacement therapy is available to decrease symptoms and risks. You should talk to your health care provider about whether hormone replacement therapy is right for you.  Use sunscreen. Apply sunscreen liberally and repeatedly throughout the day. You should seek shade when your shadow is shorter than you. Protect yourself by wearing long sleeves, pants, a wide-brimmed hat, and sunglasses year round, whenever you are outdoors.  Once a month, do a whole body skin exam, using a mirror to look at the skin on your back. Tell your health care provider of new moles, moles that have irregular borders, moles that are larger than a pencil eraser, or moles that have changed in shape or color.  Stay current with required vaccines (immunizations).  Influenza vaccine. All adults should be immunized every year.  Tetanus, diphtheria, and acellular pertussis (Td, Tdap) vaccine. Pregnant women should  receive 1 dose of Tdap vaccine during each pregnancy. The dose should be obtained regardless of the length of time since the last dose. Immunization is preferred during the 27th-36th week of gestation. An adult who has not previously received Tdap or who does not know her vaccine status should receive 1 dose of Tdap. This initial dose should be followed by tetanus and diphtheria toxoids (Td) booster doses every 10 years. Adults with an unknown or incomplete history of completing a 3-dose immunization series with Td-containing vaccines should begin or complete a primary immunization series including a Tdap dose. Adults should receive a Td booster every 10 years.  Varicella vaccine. An adult without evidence of immunity to varicella should receive 2 doses or a second dose if she has previously received 1 dose. Pregnant females who do not have evidence of immunity should receive the first dose after pregnancy. This first dose should be obtained before leaving the health care facility. The second dose should be obtained 4-8 weeks after the first dose.  Human papillomavirus (HPV) vaccine. Females aged 13-26 years who have not received the vaccine previously should obtain the 3-dose series. The vaccine is not recommended for use in pregnant females. However, pregnancy testing is not needed before receiving a dose. If a female is found to be pregnant after receiving a dose, no treatment is needed. In that case, the remaining doses should be delayed until after the pregnancy. Immunization is recommended for any person with an immunocompromised condition through the age of 26 years if she did not get any or all doses earlier. During the 3-dose series, the second dose should be obtained 4-8 weeks after the first dose. The third dose should be obtained 24 weeks after the first dose and 16 weeks after the second dose.  Zoster vaccine. One dose is recommended for adults aged 60 years or older unless certain conditions are  present.  Measles, mumps, and rubella (MMR) vaccine. Adults born before 1957 generally are considered immune to measles and mumps. Adults born in 1957 or later should have 1 or more doses of MMR vaccine unless there is a contraindication to the vaccine or there is laboratory evidence of immunity to   each of the three diseases. A routine second dose of MMR vaccine should be obtained at least 28 days after the first dose for students attending postsecondary schools, health care workers, or international travelers. People who received inactivated measles vaccine or an unknown type of measles vaccine during 1963-1967 should receive 2 doses of MMR vaccine. People who received inactivated mumps vaccine or an unknown type of mumps vaccine before 1979 and are at high risk for mumps infection should consider immunization with 2 doses of MMR vaccine. For females of childbearing age, rubella immunity should be determined. If there is no evidence of immunity, females who are not pregnant should be vaccinated. If there is no evidence of immunity, females who are pregnant should delay immunization until after pregnancy. Unvaccinated health care workers born before 1957 who lack laboratory evidence of measles, mumps, or rubella immunity or laboratory confirmation of disease should consider measles and mumps immunization with 2 doses of MMR vaccine or rubella immunization with 1 dose of MMR vaccine.  Pneumococcal 13-valent conjugate (PCV13) vaccine. When indicated, a person who is uncertain of her immunization history and has no record of immunization should receive the PCV13 vaccine. An adult aged 19 years or older who has certain medical conditions and has not been previously immunized should receive 1 dose of PCV13 vaccine. This PCV13 should be followed with a dose of pneumococcal polysaccharide (PPSV23) vaccine. The PPSV23 vaccine dose should be obtained at least 8 weeks after the dose of PCV13 vaccine. An adult aged 19  years or older who has certain medical conditions and previously received 1 or more doses of PPSV23 vaccine should receive 1 dose of PCV13. The PCV13 vaccine dose should be obtained 1 or more years after the last PPSV23 vaccine dose.  Pneumococcal polysaccharide (PPSV23) vaccine. When PCV13 is also indicated, PCV13 should be obtained first. All adults aged 65 years and older should be immunized. An adult younger than age 65 years who has certain medical conditions should be immunized. Any person who resides in a nursing home or long-term care facility should be immunized. An adult smoker should be immunized. People with an immunocompromised condition and certain other conditions should receive both PCV13 and PPSV23 vaccines. People with human immunodeficiency virus (HIV) infection should be immunized as soon as possible after diagnosis. Immunization during chemotherapy or radiation therapy should be avoided. Routine use of PPSV23 vaccine is not recommended for American Indians, [place] Natives, or people younger than 65 years unless there are medical conditions that require PPSV23 vaccine. When indicated, people who have unknown immunization and have no record of immunization should receive PPSV23 vaccine. One-time revaccination 5 years after the first dose of PPSV23 is recommended for people aged 19-64 years who have chronic kidney failure, nephrotic syndrome, asplenia, or immunocompromised conditions. People who received 1-2 doses of PPSV23 before age 65 years should receive another dose of PPSV23 vaccine at age 65 years or later if at least 5 years have passed since the previous dose. Doses of PPSV23 are not needed for people immunized with PPSV23 at or after age 65 years.  Meningococcal vaccine. Adults with asplenia or persistent complement component deficiencies should receive 2 doses of quadrivalent meningococcal conjugate (MenACWY-D) vaccine. The doses should be obtained at least 2 months apart.  Microbiologists working with certain meningococcal bacteria, military recruits, people at risk during an outbreak, and people who travel to or live in countries with a high rate of meningitis should be immunized. A first-year college student up through age   21 years who is living in a residence hall should receive a dose if she did not receive a dose on or after her 16th birthday. Adults who have certain high-risk conditions should receive one or more doses of vaccine.  Hepatitis A vaccine. Adults who wish to be protected from this disease, have certain high-risk conditions, work with hepatitis A-infected animals, work in hepatitis A research labs, or travel to or work in countries with a high rate of hepatitis A should be immunized. Adults who were previously unvaccinated and who anticipate close contact with an international adoptee during the first 60 days after arrival in the [place] from a country with a high rate of hepatitis A should be immunized.  Hepatitis B vaccine. Adults who wish to be protected from this disease, have certain high-risk conditions, may be exposed to blood or other infectious body fluids, are household contacts or sex partners of hepatitis B positive people, are clients or workers in certain care facilities, or travel to or work in countries with a high rate of hepatitis B should be immunized.  Haemophilus influenzae type b (Hib) vaccine. A previously unvaccinated person with asplenia or sickle cell disease or having a scheduled splenectomy should receive 1 dose of Hib vaccine. Regardless of previous immunization, a recipient of a hematopoietic stem cell transplant should receive a 3-dose series 6-12 months after her successful transplant. Hib vaccine is not recommended for adults with HIV infection. Preventive Services / Frequency Ages 64 to 68 years  Blood pressure check.** / Every 1 to 2 years.  Lipid and cholesterol check.** / Every 5 years beginning at age  22.  Clinical breast exam.** / Every 3 years for women in their 88s and 53s.  BRCA-related cancer risk assessment.** / For women who have family members with a BRCA-related cancer (breast, ovarian, tubal, or peritoneal cancers).  Pap test.** / Every 2 years from ages 90 through 51. Every 3 years starting at age 21 through age 56 or 3 with a history of 3 consecutive normal Pap tests.  HPV screening.** / Every 3 years from ages 24 through ages 1 to 46 with a history of 3 consecutive normal Pap tests.  Hepatitis C blood test.** / For any individual with known risks for hepatitis C.  Skin self-exam. / Monthly.  Influenza vaccine. / Every year.  Tetanus, diphtheria, and acellular pertussis (Tdap, Td) vaccine.** / Consult your health care provider. Pregnant women should receive 1 dose of Tdap vaccine during each pregnancy. 1 dose of Td every 10 years.  Varicella vaccine.** / Consult your health care provider. Pregnant females who do not have evidence of immunity should receive the first dose after pregnancy.  HPV vaccine. / 3 doses over 6 months, if 72 and younger. The vaccine is not recommended for use in pregnant females. However, pregnancy testing is not needed before receiving a dose.  Measles, mumps, rubella (MMR) vaccine.** / You need at least 1 dose of MMR if you were born in 1957 or later. You may also need a 2nd dose. For females of childbearing age, rubella immunity should be determined. If there is no evidence of immunity, females who are not pregnant should be vaccinated. If there is no evidence of immunity, females who are pregnant should delay immunization until after pregnancy.  Pneumococcal 13-valent conjugate (PCV13) vaccine.** / Consult your health care provider.  Pneumococcal polysaccharide (PPSV23) vaccine.** / 1 to 2 doses if you smoke cigarettes or if you have certain conditions.  Meningococcal vaccine.** /  1 dose if you are age 19 to 21 years and a first-year college  student living in a residence hall, or have one of several medical conditions, you need to get vaccinated against meningococcal disease. You may also need additional booster doses.  Hepatitis A vaccine.** / Consult your health care provider.  Hepatitis B vaccine.** / Consult your health care provider.  Haemophilus influenzae type b (Hib) vaccine.** / Consult your health care provider. Ages 40 to 64 years  Blood pressure check.** / Every 1 to 2 years.  Lipid and cholesterol check.** / Every 5 years beginning at age 20 years.  Lung cancer screening. / Every year if you are aged 55-80 years and have a 30-pack-year history of smoking and currently smoke or have quit within the past 15 years. Yearly screening is stopped once you have quit smoking for at least 15 years or develop a health problem that would prevent you from having lung cancer treatment.  Clinical breast exam.** / Every year after age 40 years.  BRCA-related cancer risk assessment.** / For women who have family members with a BRCA-related cancer (breast, ovarian, tubal, or peritoneal cancers).  Mammogram.** / Every year beginning at age 40 years and continuing for as long as you are in good health. Consult with your health care provider.  Pap test.** / Every 3 years starting at age 30 years through age 65 or 70 years with a history of 3 consecutive normal Pap tests.  HPV screening.** / Every 3 years from ages 30 years through ages 65 to 70 years with a history of 3 consecutive normal Pap tests.  Fecal occult blood test (FOBT) of stool. / Every year beginning at age 50 years and continuing until age 75 years. You may not need to do this test if you get a colonoscopy every 10 years.  Flexible sigmoidoscopy or colonoscopy.** / Every 5 years for a flexible sigmoidoscopy or every 10 years for a colonoscopy beginning at age 50 years and continuing until age 75 years.  Hepatitis C blood test.** / For all people born from 1945 through  1965 and any individual with known risks for hepatitis C.  Skin self-exam. / Monthly.  Influenza vaccine. / Every year.  Tetanus, diphtheria, and acellular pertussis (Tdap/Td) vaccine.** / Consult your health care provider. Pregnant women should receive 1 dose of Tdap vaccine during each pregnancy. 1 dose of Td every 10 years.  Varicella vaccine.** / Consult your health care provider. Pregnant females who do not have evidence of immunity should receive the first dose after pregnancy.  Zoster vaccine.** / 1 dose for adults aged 60 years or older.  Measles, mumps, rubella (MMR) vaccine.** / You need at least 1 dose of MMR if you were born in 1957 or later. You may also need a 2nd dose. For females of childbearing age, rubella immunity should be determined. If there is no evidence of immunity, females who are not pregnant should be vaccinated. If there is no evidence of immunity, females who are pregnant should delay immunization until after pregnancy.  Pneumococcal 13-valent conjugate (PCV13) vaccine.** / Consult your health care provider.  Pneumococcal polysaccharide (PPSV23) vaccine.** / 1 to 2 doses if you smoke cigarettes or if you have certain conditions.  Meningococcal vaccine.** / Consult your health care provider.  Hepatitis A vaccine.** / Consult your health care provider.  Hepatitis B vaccine.** / Consult your health care provider.  Haemophilus influenzae type b (Hib) vaccine.** / Consult your health care provider. Ages 65   years and over  Blood pressure check.** / Every 1 to 2 years.  Lipid and cholesterol check.** / Every 5 years beginning at age 22 years.  Lung cancer screening. / Every year if you are aged 73-80 years and have a 30-pack-year history of smoking and currently smoke or have quit within the past 15 years. Yearly screening is stopped once you have quit smoking for at least 15 years or develop a health problem that would prevent you from having lung cancer  treatment.  Clinical breast exam.** / Every year after age 4 years.  BRCA-related cancer risk assessment.** / For women who have family members with a BRCA-related cancer (breast, ovarian, tubal, or peritoneal cancers).  Mammogram.** / Every year beginning at age 40 years and continuing for as long as you are in good health. Consult with your health care provider.  Pap test.** / Every 3 years starting at age 9 years through age 34 or 91 years with 3 consecutive normal Pap tests. Testing can be stopped between 65 and 70 years with 3 consecutive normal Pap tests and no abnormal Pap or HPV tests in the past 10 years.  HPV screening.** / Every 3 years from ages 57 years through ages 64 or 45 years with a history of 3 consecutive normal Pap tests. Testing can be stopped between 65 and 70 years with 3 consecutive normal Pap tests and no abnormal Pap or HPV tests in the past 10 years.  Fecal occult blood test (FOBT) of stool. / Every year beginning at age 15 years and continuing until age 17 years. You may not need to do this test if you get a colonoscopy every 10 years.  Flexible sigmoidoscopy or colonoscopy.** / Every 5 years for a flexible sigmoidoscopy or every 10 years for a colonoscopy beginning at age 86 years and continuing until age 71 years.  Hepatitis C blood test.** / For all people born from [birth year] through 1963 and any individual with known risks for hepatitis C.  Osteoporosis screening.** / A one-time screening for women ages 83 years and over and women at risk for fractures or osteoporosis.  Skin self-exam. / Monthly.  Influenza vaccine. / Every year.  Tetanus, diphtheria, and acellular pertussis (Tdap/Td) vaccine.** / 1 dose of Td every 10 years.  Varicella vaccine.** / Consult your health care provider.  Zoster vaccine.** / 1 dose for adults aged 61 years or older.  Pneumococcal 13-valent conjugate (PCV13) vaccine.** / Consult your health care provider.  Pneumococcal  polysaccharide (PPSV23) vaccine.** / 1 dose for all adults aged 28 years and older.  Meningococcal vaccine.** / Consult your health care provider.  Hepatitis A vaccine.** / Consult your health care provider.  Hepatitis B vaccine.** / Consult your health care provider.  Haemophilus influenzae type b (Hib) vaccine.** / Consult your health care provider. ** Family history and personal history of risk and conditions may change your health care provider's recommendations. Document Released: 05/27/2001 Document Revised: 08/15/2013 Document Reviewed: 08/26/2010 [facility] Patient Information 2015 [place], [place]. This information is not intended to replace advice given to you by your health care provider. Make sure you discuss any questions you have with your health care provider.

## 2014-04-21 NOTE — Assessment & Plan Note (Signed)
Here for medicare wellness/physical  Diet: heart healthy  Physical activity: sedentary  Depression/mood screen: negative  Hearing: intact to whispered voice  Visual acuity: grossly normal, performs annual eye exam  ADLs: capable  Fall risk: moderate (using a walker)  Home safety: good  Cognitive evaluation: intact to orientation, naming, recall and repetition  EOL planning: adv directives, full code/ I agree  I have personally reviewed and have noted  1. The patient's medical and social history  2. Their use of alcohol, tobacco or illicit drugs  3. Their current medications and supplements  4. The patient's functional ability including ADL's, fall risks, home safety risks and hearing or visual impairment.  5. Diet and physical activities  6. Evidence for depression or mood disorders    Today patient counseled on age appropriate routine health concerns for screening and prevention, each reviewed and up to date or declined. Immunizations reviewed and up to date or declined. Labs ordered and reviewed. Risk factors for depression reviewed and negative. Hearing function and visual acuity are intact. ADLs screened and addressed as needed. Functional ability and level of safety reviewed and appropriate. Education, counseling and referrals performed based on assessed risks today. Patient provided with a copy of personalized plan for preventive services.

## 2014-04-21 NOTE — Progress Notes (Signed)
Pre visit review using our clinic review tool, if applicable. No additional management support is needed unless otherwise documented below in the visit note. 

## 2014-04-21 NOTE — Assessment & Plan Note (Signed)
Continue with current prescription therapy as reflected on the Med list.  

## 2014-05-08 ENCOUNTER — Telehealth: Payer: Self-pay | Admitting: Internal Medicine

## 2014-05-08 MED ORDER — LEVOTHYROXINE SODIUM 125 MCG PO TABS: 125.0000 ug | ORAL_TABLET | Freq: Every day | ORAL | Status: DC

## 2014-05-08 NOTE — Telephone Encounter (Signed)
Patient is requesting refill of levothyroxine (SYNTHROID) 125 MCG tablet [[id number]] via [facility]. She states that the pharmacy states we denied refill request over the weekend, but i see no notation of that.

## 2014-05-08 NOTE — Telephone Encounter (Signed)
Rf sent. Pt informed  

## 2014-05-19 ENCOUNTER — Encounter: Payer: MEDICARE | Admitting: Internal Medicine

## 2014-06-10 ENCOUNTER — Encounter (HOSPITAL_COMMUNITY): Payer: Self-pay | Admitting: Emergency Medicine

## 2014-06-10 ENCOUNTER — Emergency Department (HOSPITAL_COMMUNITY): Payer: MEDICARE

## 2014-06-10 DIAGNOSIS — R0602 Shortness of breath: Secondary | ICD-10-CM | POA: Diagnosis not present

## 2014-06-10 DIAGNOSIS — Z880 Allergy status to penicillin: Secondary | ICD-10-CM | POA: Insufficient documentation

## 2014-06-10 DIAGNOSIS — Z8619 Personal history of other infectious and parasitic diseases: Secondary | ICD-10-CM | POA: Diagnosis not present

## 2014-06-10 DIAGNOSIS — E785 Hyperlipidemia, unspecified: Secondary | ICD-10-CM | POA: Insufficient documentation

## 2014-06-10 DIAGNOSIS — Z862 Personal history of diseases of the blood and blood-forming organs and certain disorders involving the immune mechanism: Secondary | ICD-10-CM | POA: Insufficient documentation

## 2014-06-10 DIAGNOSIS — H9190 Unspecified hearing loss, unspecified ear: Secondary | ICD-10-CM | POA: Insufficient documentation

## 2014-06-10 DIAGNOSIS — I10 Essential (primary) hypertension: Secondary | ICD-10-CM | POA: Insufficient documentation

## 2014-06-10 DIAGNOSIS — Z79899 Other long term (current) drug therapy: Secondary | ICD-10-CM | POA: Diagnosis not present

## 2014-06-10 DIAGNOSIS — Z8719 Personal history of other diseases of the digestive system: Secondary | ICD-10-CM | POA: Diagnosis not present

## 2014-06-10 DIAGNOSIS — Z7982 Long term (current) use of aspirin: Secondary | ICD-10-CM | POA: Insufficient documentation

## 2014-06-10 DIAGNOSIS — E039 Hypothyroidism, unspecified: Secondary | ICD-10-CM | POA: Insufficient documentation

## 2014-06-10 DIAGNOSIS — R42 Dizziness and giddiness: Secondary | ICD-10-CM | POA: Diagnosis not present

## 2014-06-10 LAB — URINALYSIS, ROUTINE W REFLEX MICROSCOPIC
Bilirubin Urine: NEGATIVE
Glucose, UA: NEGATIVE mg/dL
KETONES UR: NEGATIVE mg/dL
Nitrite: NEGATIVE
PH: 5.5 (ref 5.0–8.0)
PROTEIN: NEGATIVE mg/dL
SPECIFIC GRAVITY, URINE: 1.015 (ref 1.005–1.030)
Urobilinogen, UA: 0.2 mg/dL (ref 0.0–1.0)

## 2014-06-10 LAB — BASIC METABOLIC PANEL
ANION GAP: 7 (ref 5–15)
BUN: 32 mg/dL — AB (ref 6–23)
CHLORIDE: 105 mmol/L (ref 96–112)
CO2: 27 mmol/L (ref 19–32)
Calcium: 9.4 mg/dL (ref 8.4–10.5)
Creatinine, Ser: 0.84 mg/dL (ref 0.50–1.10)
GFR calc non Af Amer: 60 mL/min — ABNORMAL LOW (ref >90)
GFR, EST AFRICAN AMERICAN: 69 mL/min — AB (ref >90)
Glucose, Bld: 163 mg/dL — ABNORMAL HIGH (ref 70–99)
POTASSIUM: 4.1 mmol/L (ref 3.5–5.1)
SODIUM: 139 mmol/L (ref 135–145)

## 2014-06-10 LAB — CBC
HCT: 34.3 % — ABNORMAL LOW (ref 36.0–46.0)
Hemoglobin: 11.2 g/dL — ABNORMAL LOW (ref 12.0–15.0)
MCH: 30.5 pg (ref 26.0–34.0)
MCHC: 32.7 g/dL (ref 30.0–36.0)
MCV: 93.5 fL (ref 78.0–100.0)
Platelets: 199 10*3/uL (ref 150–400)
RBC: 3.67 MIL/uL — ABNORMAL LOW (ref 3.87–5.11)
RDW: 12.9 % (ref 11.5–15.5)
WBC: 6.2 10*3/uL (ref 4.0–10.5)

## 2014-06-10 LAB — URINE MICROSCOPIC-ADD ON

## 2014-06-10 LAB — CBG MONITORING, ED: Glucose-Capillary: 154 mg/dL — ABNORMAL HIGH (ref 70–99)

## 2014-06-10 LAB — I-STAT TROPONIN, ED: TROPONIN I, POC: 0.01 ng/mL (ref 0.00–0.08)

## 2014-06-10 NOTE — ED Notes (Signed)
Pt reports dizziness and chest fullness that is relieved by burping. Pt sts she has been feeling bad for 2 days.

## 2014-06-11 ENCOUNTER — Emergency Department (HOSPITAL_COMMUNITY)
Admission: EM | Admit: 2014-06-11 | Discharge: 2014-06-11 | Disposition: A | Discharge status: Home / Self Care | Payer: MEDICARE | Attending: Emergency Medicine | Admitting: Emergency Medicine

## 2014-06-11 DIAGNOSIS — R42 Dizziness and giddiness: Secondary | ICD-10-CM

## 2014-06-11 NOTE — ED Notes (Signed)
Dr. Tomi Bamberger in the room

## 2014-06-11 NOTE — ED Notes (Signed)
Patient not in room yet 

## 2014-06-11 NOTE — ED Provider Notes (Signed)
CSN: [id number]     Arrival date & time 06/10/14  1815 History  This chart was scribed for Kayla Norrie, MD by Hilda Lias, ED Scribe. This patient was seen in room A02C/A02C and the patient's care was started at 12:45 AM.    Chief Complaint  Patient presents with  . Dizziness     The history is provided by the patient and a relative. No language interpreter was used.     HPI Comments: Kayla Davis is a 81 y.o. female who presents to the Emergency Department complaining of dizziness that has been present since yesterday. Patient denies the spinning sensation, or feeling like she's going to pass out, or feeling that she is off balance. She states the dizziness actually is a heaviness of her head. She denies any pain in her head. She denies any neck pain. She reports she's been doing paperwork all week and has been sitting with her head leaning forward and she normally stays up to 3 AM in the morning. Pt states that she was afraid to get up from sitting while she was at home by herself because she thought that she would fall. Pt states that her head feels "heavy", and notes she did not get up until her son came home from work yesterday. Pt states that she does not walk well as she has an arthritic hip and uses a walker at home. Her son notes that she used her walker to move around in the house and eat dinner, as well as walked to the car to get to the hospital. Pt also notes she had chest fullness last night and the night before, and pt states she started burping during both instances which made it go away. She states the discomfort only lasted a few minutes. Pt denies neck pain, numbness, vomiting, fever, or SOB. She states while sitting in the emergency department she was doing neck exercises in the heaviness in her head is now gone. Her son states there was no change in her walking. He actually states she ambulated from her house to the vehicle and from the vehicle to the waiting room until he  got her in a wheelchair. There was no change in her gait. He reports her speech is normal and there was no asymmetry in her face.  Patient has a pacemaker and she last had it interrogated about a month ago when they did some adjustments to it.   PCP Dr Alain Marion  Cardiologist Dr [name]  Past Medical History  Diagnosis Date  . Hypertension   . Hyperlipidemia   . Aortic valve disorders   . Unspecified hereditary and idiopathic peripheral neuropathy   . Other abnormal glucose   . Unspecified hearing loss   . Unspecified chronic bronchitis   . Unspecified hypothyroidism   . Unspecified gastritis and gastroduodenitis without mention of hemorrhage   . Diverticulosis of colon (without mention of hemorrhage)   . Irritable bowel syndrome   . Osteoarthrosis, unspecified whether generalized or localized, unspecified site   . Backache, unspecified   . Pain in limb   . Osteoporosis, unspecified   . Herpes zoster with other nervous system complications(053.19)   . Anxiety state, unspecified   . Anemia, unspecified   . Bradycardia     s/p PPM   Past Surgical History  Procedure Laterality Date  . Pacemaker insertion      MDT  . Appendectomy    . Cholecystectomy    . Tonsillectomy    .  Vaginal hysterectomy    . Cataract extraction, bilateral  end of June 2013  . Cataract extraction, bilateral  beginning of July 2013  . Endarterectomy Right 01/20/2014    Procedure: RIGHT CAROTID ARTERY ENDARTERECTOMY WITH DACRON PATCH ANGIOPLASTY;  Surgeon: Mal Misty, MD;  Location: [facility] OR;  Service: Vascular;  Laterality: Right;   Family History  Problem Relation Age of Onset  . Heart disease Father   . Cancer Paternal Grandmother   . Diabetes Father   . Heart disease Mother    History  Substance Use Topics  . Smoking status: Never Smoker   . Smokeless tobacco: Never Used  . Alcohol Use: No   lives at home Lives alone Son stays with her at night Uses a walker to ambulate Patient wears  hearing aids  OB History    No data available     Review of Systems  Constitutional: Negative for fever.  Respiratory: Negative for shortness of breath.   Gastrointestinal: Negative for vomiting.  Musculoskeletal: Negative for neck pain.  Neurological: Positive for dizziness. Negative for numbness.  All other systems reviewed and are negative.     Allergies  Penicillins; Alprazolam; Bactrim; Ceclor; Cephalexin; Ciprofloxacin; Clindamycin/lincomycin; Hydrocodone; Noroxin; Sulfonamide derivatives; Tetracyclines & related; and Tramadol  Home Medications   Prior to Admission medications   Medication Sig Start Date End Date Taking? Authorizing Provider  acetaminophen (TYLENOL) 500 MG tablet Take 500 mg by mouth 2 (two) times a week.    Yes Historical Provider, MD  aspirin 325 MG tablet Take 325 mg by mouth every morning.    Yes Historical Provider, MD  cholecalciferol (VITAMIN D) 1000 UNITS tablet Take 1,000 Units by mouth daily.     Yes Historical Provider, MD  Cyanocobalamin (B-12) 2500 MCG TABS Take 2,500 mcg by mouth daily.    Yes Historical Provider, MD  hydroxypropyl methylcellulose / hypromellose (ISOPTO TEARS / GONIOVISC) 2.5 % ophthalmic solution Place 1 drop into both eyes 2 (two) times daily.   Yes Historical Provider, MD  levothyroxine (SYNTHROID) 125 MCG tablet Take 1 tablet (125 mcg total) by mouth daily before breakfast. 05/08/14  Yes Aleksei Plotnikov V, MD  losartan-hydrochlorothiazide (HYZAAR) 50-12.5 MG per tablet Take 0.5 tablets by mouth every morning.   Yes Historical Provider, MD  polyethylene glycol (MIRALAX / GLYCOLAX) packet Take 17 g by mouth daily as needed for moderate constipation.  09/29/12  Yes Marin Olp, MD  pregabalin (LYRICA) 75 MG capsule Take 1 capsule (75 mg total) by mouth at bedtime. 11/15/13 11/15/14 Yes Aleksei Plotnikov V, MD  simvastatin (ZOCOR) 40 MG tablet Take 1 tablet (40 mg total) by mouth daily. 11/16/13  Yes Aleksei Plotnikov V, MD   sodium chloride (OCEAN) 0.65 % SOLN nasal spray Place 1 spray into both nostrils 2 (two) times daily as needed for congestion.   Yes Historical Provider, MD  docusate sodium 100 MG CAPS Take 100 mg by mouth daily as needed for mild constipation. Patient not taking: Reported on 06/11/2014 01/21/14   Ripudeep Krystal Eaton, MD  glucose blood (ONE TOUCH TEST STRIPS) test strip Use as instructed 02/10/14   Aleksei Plotnikov V, MD  guaiFENesin-dextromethorphan (ROBITUSSIN DM) 100-10 MG/5ML syrup Take 10 mLs by mouth every 4 (four) hours as needed for cough. Patient not taking: Reported on 04/21/2014 01/21/14   Ripudeep Krystal Eaton, MD  Lancets [facility] ULTRASOFT) lancets Use as instructed 02/10/14   Lew Dawes V, MD  triamcinolone cream (KENALOG) 0.5 % Apply 1 application topically 3 (  three) times daily. Patient not taking: Reported on 06/11/2014 04/21/14   Aleksei Plotnikov V, MD   BP 129/56 mmHg  Pulse 64  Temp(Src) 97.8 F (36.6 C) (Oral)  Resp 19  Ht 5\' 5"  (1.651 m)  Wt 152 lb (68.947 kg)  BMI 25.29 kg/m2  SpO2 99%  Vital signs normal   Physical Exam  Constitutional: She is oriented to person, place, and time. She appears well-developed and well-nourished.  Non-toxic appearance. She does not appear ill. No distress.  HENT:  Head: Normocephalic and atraumatic.  Right Ear: Tympanic membrane, external ear and ear canal normal.  Left Ear: Tympanic membrane, external ear and ear canal normal.  Nose: Nose normal. No mucosal edema or rhinorrhea.  Mouth/Throat: Oropharynx is clear and moist and mucous membranes are normal. No dental abscesses or uvula swelling.  TMs without redness or purulent drainage behind them  Eyes: Conjunctivae and EOM are normal. Pupils are equal, round, and reactive to light.  Neck: Normal range of motion and full passive range of motion without pain. Neck supple.  Cardiovascular: Normal rate, regular rhythm and normal heart sounds.  Exam reveals no gallop and no friction rub.    No murmur heard. Pulmonary/Chest: Effort normal and breath sounds normal. No respiratory distress. She has no wheezes. She has no rhonchi. She has no rales. She exhibits no tenderness and no crepitus.  Abdominal: Soft. Normal appearance and bowel sounds are normal. She exhibits no distension. There is no tenderness. There is no rebound and no guarding.  Musculoskeletal: Normal range of motion. She exhibits no edema or tenderness.  Moves all extremities well.   Neurological: She is alert and oriented to person, place, and time. She has normal strength. No cranial nerve deficit.  No facial assymetry Grips equal Finger-to-nose was coordinated bilaterally Mild difficulty with straight leg raising on left because of previous hip problems   Skin: Skin is warm, dry and intact. No rash noted. No erythema. No pallor.  Psychiatric: She has a normal mood and affect. Her speech is normal and behavior is normal. Her mood appears not anxious.  Nursing note and vitals reviewed.   ED Course  Procedures (including critical care time)  DIAGNOSTIC STUDIES: Oxygen Saturation is 94% on RA, normal by my interpretation.    COORDINATION OF CARE: 1:07 AM Discussed treatment plan with pt at bedside and pt agreed to plan.  Orthostatic Lying  - BP- Lying: 129/56 mmHg ; Pulse- Lying: 64  Orthostatic Sitting - BP- Sitting: 147/86 mmHg ; Pulse- Sitting: 65  Orthostatic Standing at 0 minutes - BP- Standing at 0 minutes: 160/68 mmHg ; Pulse- Standing at 0 minutes: 73 Orthostatic vital signs are normal  Patient ambulated with a walker with nursing staff and states she felt fine. Patient states she is back to her normal at this time.  Patient was concerned she might have a UTI. Her urine was not overwhelmingly positive for a UTI. When I reviewed her urine culture results the most recent ones in the past 6-8 months had been growing multiple organisms. She did have a culture a year ago that grew Escherichia coli and  one with MRSA. She has elected to wait to get the culture results. She is going to call her PCP office to follow through for that.   Labs Review Results for orders placed or performed during the hospital encounter of 06/11/14  CBC  Result Value Ref Range   WBC 6.2 4.0 - 10.5 K/uL   RBC 3.67 (L)  3.87 - 5.11 MIL/uL   Hemoglobin 11.2 (L) 12.0 - 15.0 g/dL   HCT 34.3 (L) 36.0 - 46.0 %   MCV 93.5 78.0 - 100.0 fL   MCH 30.5 26.0 - 34.0 pg   MCHC 32.7 30.0 - 36.0 g/dL   RDW 12.9 11.5 - 15.5 %   Platelets 199 150 - 400 K/uL  Basic metabolic panel  Result Value Ref Range   Sodium 139 135 - 145 mmol/L   Potassium 4.1 3.5 - 5.1 mmol/L   Chloride 105 96 - 112 mmol/L   CO2 27 19 - 32 mmol/L   Glucose, Bld 163 (H) 70 - 99 mg/dL   BUN 32 (H) 6 - 23 mg/dL   Creatinine, Ser 0.84 0.50 - 1.10 mg/dL   Calcium 9.4 8.4 - 10.5 mg/dL   GFR calc non Af Amer 60 (L) >90 mL/min   GFR calc Af Amer 69 (L) >90 mL/min   Anion gap 7 5 - 15  Urinalysis, Routine w reflex microscopic  Result Value Ref Range   Color, Urine YELLOW YELLOW   APPearance CLEAR CLEAR   Specific Gravity, Urine 1.015 1.005 - 1.030   pH 5.5 5.0 - 8.0   Glucose, UA NEGATIVE NEGATIVE mg/dL   Hgb urine dipstick TRACE (A) NEGATIVE   Bilirubin Urine NEGATIVE NEGATIVE   Ketones, ur NEGATIVE NEGATIVE mg/dL   Protein, ur NEGATIVE NEGATIVE mg/dL   Urobilinogen, UA 0.2 0.0 - 1.0 mg/dL   Nitrite NEGATIVE NEGATIVE   Leukocytes, UA TRACE (A) NEGATIVE  Urine microscopic-add on  Result Value Ref Range   Squamous Epithelial / LPF FEW (A) RARE   WBC, UA 3-6 <3 WBC/hpf   RBC / HPF 0-2 <3 RBC/hpf   Bacteria, UA MANY (A) RARE  I-stat troponin, ED (not at [facility])  Result Value Ref Range   Troponin i, poc 0.01 0.00 - 0.08 ng/mL   Comment 3          CBG monitoring, ED  Result Value Ref Range   Glucose-Capillary 154 (H) 70 - 99 mg/dL   Comment 1 Notify RN    Laboratory interpretation all normal except ? URI, mild anemia     Imaging Review Dg  Chest 2 View  06/10/2014   CLINICAL DATA:  Short breath and dizziness  EXAM: CHEST  2 VIEW  COMPARISON:  01/17/2014  FINDINGS: Right-sided pacemaker overlies normal cardiac silhouette. There is chronic bronchitic markings. No effusion, infiltrate, pneumothorax. Degenerative osteophytosis of the thoracic spine.  IMPRESSION: No acute cardiopulmonary process.  Chronic Bronchitic change.   Electronically Signed   By: Suzy Bouchard M.D.   On: 06/10/2014 20:02     EKG Interpretation   Date/Time:  Saturday June 10 2014 18:26:57 EST Ventricular Rate:  70 PR Interval:  290 QRS Duration: 134 QT Interval:  436 QTC Calculation: 470 R Axis:   -51 Text Interpretation:  Sinus rhythm with 1st degree A-V block Left axis  deviation Left bundle branch block last EKG was atrial-sensed ventricular  paced rhythm (16 Jan 2014) Confirmed by Nigil Braman  MD-I, Gabrelle Roca ([id number]) on  06/11/2014 12:44:37 AM      MDM   Final diagnoses:  Dizziness, nonspecific    Plan discharge  Rolland Porter, MD, FACEP   I personally performed the services described in this documentation, which was scribed in my presence. The recorded information has been reviewed and considered.  Rolland Porter, MD, FACEP    Kayla Norrie, MD 06/11/14 [phone]

## 2014-06-11 NOTE — ED Notes (Signed)
Dr. Tomi Bamberger is at the bedside.

## 2014-06-11 NOTE — ED Notes (Signed)
Ambulated patient in the hall. O2 saturation was 94-100% and HR was 74- 89. After walk patient recovered within one minute\. HR was back to 65 and SpO2 was back to 100 %.

## 2014-06-11 NOTE — ED Notes (Signed)
Clarified with Dr. Tomi Bamberger, patient does not need head CT prior to discharge. Preparing discharge now.

## 2014-06-11 NOTE — ED Notes (Signed)
Patient ambulated in the hallway with EMT assist along with use of walker.

## 2014-06-11 NOTE — Discharge Instructions (Signed)
Try to exercise your neck every 15-30 minutes when you are doing paper work. Call Dr Plotnikov's office on Monday, February 29th to let them know you had a urine culture done.  Return to the ED if you get a fever, have a spinning sensation, feel off balance, have persistent chest pain or feel worse.

## 2014-06-12 ENCOUNTER — Telehealth: Payer: Self-pay | Admitting: Internal Medicine

## 2014-06-12 NOTE — Telephone Encounter (Signed)
Pt called in and said that she had a culture done on Saturday in ER and said it would be in tues back to Dr plot office to see if she needed any meds.  She wanted to know if someone could call her once it was in?

## 2014-06-13 NOTE — Telephone Encounter (Signed)
Notified pt with md response.../lmb 

## 2014-06-13 NOTE — Telephone Encounter (Signed)
UA was nl Cx was not done - n/a Thx

## 2014-06-19 ENCOUNTER — Ambulatory Visit (INDEPENDENT_AMBULATORY_CARE_PROVIDER_SITE_OTHER): Payer: MEDICARE | Admitting: *Deleted

## 2014-06-19 ENCOUNTER — Telehealth: Payer: Self-pay | Admitting: Cardiology

## 2014-06-19 ENCOUNTER — Encounter: Payer: Self-pay | Admitting: Internal Medicine

## 2014-06-19 DIAGNOSIS — I495 Sick sinus syndrome: Secondary | ICD-10-CM | POA: Diagnosis not present

## 2014-06-19 LAB — MDC_IDC_ENUM_SESS_TYPE_REMOTE
Battery Impedance: 228 Ohm
Battery Voltage: 2.78 V
Brady Statistic AP VP Percent: 0 %
Brady Statistic AP VS Percent: 24 %
Brady Statistic AS VS Percent: 76 %
Date Time Interrogation Session: 20160307234303
Lead Channel Impedance Value: 526 Ohm
Lead Channel Pacing Threshold Amplitude: 0.5 V
Lead Channel Pacing Threshold Amplitude: 0.75 V
Lead Channel Pacing Threshold Pulse Width: 0.4 ms
Lead Channel Pacing Threshold Pulse Width: 0.4 ms
Lead Channel Sensing Intrinsic Amplitude: 1.4 mV
Lead Channel Sensing Intrinsic Amplitude: 11.2 mV
Lead Channel Setting Pacing Amplitude: 2 V
Lead Channel Setting Pacing Amplitude: 2.5 V
Lead Channel Setting Sensing Sensitivity: 5.6 mV
MDC IDC MSMT BATTERY REMAINING LONGEVITY: 127 mo
MDC IDC MSMT LEADCHNL RA IMPEDANCE VALUE: 479 Ohm
MDC IDC SET LEADCHNL RV PACING PULSEWIDTH: 0.4 ms
MDC IDC STAT BRADY AS VP PERCENT: 0 %

## 2014-06-19 NOTE — Telephone Encounter (Signed)
Spoke with pt and reminded pt of remote transmission that is due today. Pt verbalized understanding.   

## 2014-06-20 NOTE — Progress Notes (Signed)
Remote pacemaker transmission.   

## 2014-06-26 ENCOUNTER — Encounter: Payer: Self-pay | Admitting: Cardiology

## 2014-06-28 ENCOUNTER — Emergency Department (HOSPITAL_COMMUNITY)
Admission: EM | Admit: 2014-06-28 | Discharge: 2014-06-29 | Disposition: A | Discharge status: Home / Self Care | Payer: Medicare Other | Attending: Emergency Medicine | Admitting: Emergency Medicine

## 2014-06-28 ENCOUNTER — Encounter (HOSPITAL_COMMUNITY): Payer: Self-pay | Admitting: *Deleted

## 2014-06-28 DIAGNOSIS — Z8709 Personal history of other diseases of the respiratory system: Secondary | ICD-10-CM | POA: Diagnosis not present

## 2014-06-28 DIAGNOSIS — E785 Hyperlipidemia, unspecified: Secondary | ICD-10-CM | POA: Insufficient documentation

## 2014-06-28 DIAGNOSIS — Z8619 Personal history of other infectious and parasitic diseases: Secondary | ICD-10-CM | POA: Diagnosis not present

## 2014-06-28 DIAGNOSIS — Z8659 Personal history of other mental and behavioral disorders: Secondary | ICD-10-CM | POA: Insufficient documentation

## 2014-06-28 DIAGNOSIS — Z950 Presence of cardiac pacemaker: Secondary | ICD-10-CM | POA: Insufficient documentation

## 2014-06-28 DIAGNOSIS — E039 Hypothyroidism, unspecified: Secondary | ICD-10-CM | POA: Diagnosis not present

## 2014-06-28 DIAGNOSIS — Z880 Allergy status to penicillin: Secondary | ICD-10-CM | POA: Insufficient documentation

## 2014-06-28 DIAGNOSIS — Z7982 Long term (current) use of aspirin: Secondary | ICD-10-CM | POA: Insufficient documentation

## 2014-06-28 DIAGNOSIS — I10 Essential (primary) hypertension: Secondary | ICD-10-CM | POA: Insufficient documentation

## 2014-06-28 DIAGNOSIS — B9689 Other specified bacterial agents as the cause of diseases classified elsewhere: Secondary | ICD-10-CM | POA: Diagnosis not present

## 2014-06-28 DIAGNOSIS — Z7952 Long term (current) use of systemic steroids: Secondary | ICD-10-CM | POA: Insufficient documentation

## 2014-06-28 DIAGNOSIS — Z8669 Personal history of other diseases of the nervous system and sense organs: Secondary | ICD-10-CM | POA: Diagnosis not present

## 2014-06-28 DIAGNOSIS — N390 Urinary tract infection, site not specified: Secondary | ICD-10-CM | POA: Diagnosis not present

## 2014-06-28 DIAGNOSIS — Z8719 Personal history of other diseases of the digestive system: Secondary | ICD-10-CM | POA: Insufficient documentation

## 2014-06-28 DIAGNOSIS — R42 Dizziness and giddiness: Secondary | ICD-10-CM

## 2014-06-28 DIAGNOSIS — Z79899 Other long term (current) drug therapy: Secondary | ICD-10-CM | POA: Insufficient documentation

## 2014-06-28 DIAGNOSIS — Z862 Personal history of diseases of the blood and blood-forming organs and certain disorders involving the immune mechanism: Secondary | ICD-10-CM | POA: Diagnosis not present

## 2014-06-28 LAB — CBG MONITORING, ED: GLUCOSE-CAPILLARY: 157 mg/dL — AB (ref 70–99)

## 2014-06-28 NOTE — ED Notes (Signed)
Pt states that she began to get dizzy around 3:30pm; pt states that she feels ok sitting down but when she gets up to move she feels dizzy; pt states that the room feels like it has a film over it and moves; pt denies chest pain or Shortness of Breath; pt reports diarrhea this week; pt states that the diarrhea was better today; pt denies N/V

## 2014-06-28 NOTE — ED Notes (Signed)
cbg 157 pt states she "just ate"

## 2014-06-28 NOTE — ED Provider Notes (Signed)
CSN: [id number]     Arrival date & time 06/28/14  2036 History   First MD Initiated Contact with Patient 06/28/14 2300     Chief Complaint  Patient presents with  . Dizziness     (Consider location/radiation/quality/duration/timing/severity/associated sxs/prior Treatment) Patient is a 81 y.o. female presenting with dizziness. The history is provided by the patient.  Dizziness She woke up from a nap this afternoon and stated that she felt dizzy. The dizzy feeling was like her head was heavy if she stood up. There is a sense when looking at the TV that there was a film between her and the television but there is not any sense of things moving. There was mild nausea. She denies any ear pain. She has very poor hearing and did not notice any change in her hearing. Dizzy feeling is now completely resolved. She had been in emergency on February 20 with similar symptoms which also resolved. She stated no dizziness between then and today. She denies chest pain, heaviness, tightness, pressure. She denies dyspnea. She denies diaphoresis. Of note, she did have a carotid endarterectomy last fall.  Past Medical History  Diagnosis Date  . Hypertension   . Hyperlipidemia   . Aortic valve disorders   . Unspecified hereditary and idiopathic peripheral neuropathy   . Other abnormal glucose   . Unspecified hearing loss   . Unspecified chronic bronchitis   . Unspecified hypothyroidism   . Unspecified gastritis and gastroduodenitis without mention of hemorrhage   . Diverticulosis of colon (without mention of hemorrhage)   . Irritable bowel syndrome   . Osteoarthrosis, unspecified whether generalized or localized, unspecified site   . Backache, unspecified   . Pain in limb   . Osteoporosis, unspecified   . Herpes zoster with other nervous system complications(053.19)   . Anxiety state, unspecified   . Anemia, unspecified   . Bradycardia     s/p PPM   Past Surgical History  Procedure Laterality Date   . Pacemaker insertion      MDT  . Appendectomy    . Cholecystectomy    . Tonsillectomy    . Vaginal hysterectomy    . Cataract extraction, bilateral  end of June 2013  . Cataract extraction, bilateral  beginning of July 2013  . Endarterectomy Right 01/20/2014    Procedure: RIGHT CAROTID ARTERY ENDARTERECTOMY WITH DACRON PATCH ANGIOPLASTY;  Surgeon: Mal Misty, MD;  Location: [facility] OR;  Service: Vascular;  Laterality: Right;   Family History  Problem Relation Age of Onset  . Heart disease Father   . Cancer Paternal Grandmother   . Diabetes Father   . Heart disease Mother    History  Substance Use Topics  . Smoking status: Never Smoker   . Smokeless tobacco: Never Used  . Alcohol Use: No   OB History    No data available     Review of Systems  Neurological: Positive for dizziness.  All other systems reviewed and are negative.     Allergies  Penicillins; Alprazolam; Bactrim; Ceclor; Cephalexin; Ciprofloxacin; Clindamycin/lincomycin; Hydrocodone; Noroxin; Sulfonamide derivatives; Tetracyclines & related; and Tramadol  Home Medications   Prior to Admission medications   Medication Sig Start Date End Date Taking? Authorizing Provider  acetaminophen (TYLENOL) 500 MG tablet Take 500 mg by mouth 2 (two) times a week.    Yes Historical Provider, MD  aspirin 325 MG tablet Take 325 mg by mouth every morning.    Yes Historical Provider, MD  cholecalciferol (VITAMIN D) 1000  UNITS tablet Take 1,000 Units by mouth daily.     Yes Historical Provider, MD  Cyanocobalamin (B-12) 2500 MCG TABS Take 2,500 mcg by mouth daily.    Yes Historical Provider, MD  hydroxypropyl methylcellulose / hypromellose (ISOPTO TEARS / GONIOVISC) 2.5 % ophthalmic solution Place 1 drop into both eyes 2 (two) times daily.   Yes Historical Provider, MD  levothyroxine (SYNTHROID) 125 MCG tablet Take 1 tablet (125 mcg total) by mouth daily before breakfast. 05/08/14  Yes Aleksei Plotnikov V, MD   losartan-hydrochlorothiazide (HYZAAR) 50-12.5 MG per tablet Take 0.5 tablets by mouth every morning.   Yes Historical Provider, MD  Misc Natural Products (OSTEO BI-FLEX ADV DOUBLE ST) CAPS Take 1 capsule by mouth daily.   Yes Historical Provider, MD  Multiple Vitamins-Minerals (PRESERVISION AREDS PO) Take 1 tablet by mouth daily.   Yes Historical Provider, MD  OVER THE COUNTER MEDICATION Take 1 tablet by mouth daily. Flex-aid   Yes Historical Provider, MD  polyethylene glycol (MIRALAX / GLYCOLAX) packet Take 17 g by mouth daily as needed for moderate constipation.  09/29/12  Yes Marin Olp, MD  pregabalin (LYRICA) 75 MG capsule Take 1 capsule (75 mg total) by mouth at bedtime. 11/15/13 11/15/14 Yes Aleksei Plotnikov V, MD  simvastatin (ZOCOR) 40 MG tablet Take 1 tablet (40 mg total) by mouth daily. 11/16/13  Yes Aleksei Plotnikov V, MD  sodium chloride (OCEAN) 0.65 % SOLN nasal spray Place 1 spray into both nostrils 2 (two) times daily as needed for congestion.   Yes Historical Provider, MD  docusate sodium 100 MG CAPS Take 100 mg by mouth daily as needed for mild constipation. Patient not taking: Reported on 06/11/2014 01/21/14   Ripudeep Krystal Eaton, MD  glucose blood (ONE TOUCH TEST STRIPS) test strip Use as instructed 02/10/14   Aleksei Plotnikov V, MD  guaiFENesin-dextromethorphan (ROBITUSSIN DM) 100-10 MG/5ML syrup Take 10 mLs by mouth every 4 (four) hours as needed for cough. Patient not taking: Reported on 04/21/2014 01/21/14   Ripudeep Krystal Eaton, MD  Lancets [facility] ULTRASOFT) lancets Use as instructed 02/10/14   Lew Dawes V, MD  triamcinolone cream (KENALOG) 0.5 % Apply 1 application topically 3 (three) times daily. Patient not taking: Reported on 06/11/2014 04/21/14   Aleksei Plotnikov V, MD   BP 143/63 mmHg  Pulse 84  Temp(Src) 98.1 F (36.7 C) (Oral)  Resp 20  SpO2 96% Physical Exam  Nursing note and vitals reviewed.  81 year old female, resting comfortably and in no acute distress.  Vital signs are significant for borderline hypertension. Oxygen saturation is 96%, which is normal. Head is normocephalic and atraumatic. PERRLA, EOMI. Oropharynx is clear. Neck is nontender and supple without adenopathy or JVD. There are no carotid bruits. Back is nontender and there is no CVA tenderness. Lungs are clear without rales, wheezes, or rhonchi. Chest is nontender. Heart has regular rate and rhythm without murmur. Abdomen is soft, flat, nontender without masses or hepatosplenomegaly and peristalsis is normoactive. Extremities have no cyanosis or edema, full range of motion is present. Skin is warm and dry without rash. Neurologic: Mental status is normal, cranial nerves are intact, there are no motor or sensory deficits. There is no past pointing or ataxia on finger to nose testing. Dizziness is not reproduced by passive head movement or by testing EOMs.  ED Course  Procedures (including critical care time) Labs Review Results for orders placed or performed during the hospital encounter of 06/28/14  CBC  (at [facility])  Result Value Ref Range   WBC 5.6 4.0 - 10.5 K/uL   RBC 3.89 3.87 - 5.11 MIL/uL   Hemoglobin 11.9 (L) 12.0 - 15.0 g/dL   HCT 36.6 36.0 - 46.0 %   MCV 94.1 78.0 - 100.0 fL   MCH 30.6 26.0 - 34.0 pg   MCHC 32.5 30.0 - 36.0 g/dL   RDW 12.6 11.5 - 15.5 %   Platelets 158 150 - 400 K/uL  Basic metabolic panel  (at [facility])  Result Value Ref Range   Sodium 136 135 - 145 mmol/L   Potassium 3.8 3.5 - 5.1 mmol/L   Chloride 105 96 - 112 mmol/L   CO2 25 19 - 32 mmol/L   Glucose, Bld 139 (H) 70 - 99 mg/dL   BUN 28 (H) 6 - 23 mg/dL   Creatinine, Ser 0.73 0.50 - 1.10 mg/dL   Calcium 9.2 8.4 - 10.5 mg/dL   GFR calc non Af Amer 74 (L) >90 mL/min   GFR calc Af Amer 85 (L) >90 mL/min   Anion gap 6 5 - 15  Urinalysis, Routine w reflex microscopic  Result Value Ref Range   Color, Urine YELLOW YELLOW   APPearance CLEAR CLEAR   Specific Gravity,  Urine 1.007 1.005 - 1.030   pH 6.5 5.0 - 8.0   Glucose, UA NEGATIVE NEGATIVE mg/dL   Hgb urine dipstick TRACE (A) NEGATIVE   Bilirubin Urine NEGATIVE NEGATIVE   Ketones, ur NEGATIVE NEGATIVE mg/dL   Protein, ur NEGATIVE NEGATIVE mg/dL   Urobilinogen, UA 0.2 0.0 - 1.0 mg/dL   Nitrite NEGATIVE NEGATIVE   Leukocytes, UA SMALL (A) NEGATIVE  Differential  Result Value Ref Range   Neutrophils Relative % 41 (L) 43 - 77 %   Neutro Abs 2.3 1.7 - 7.7 K/uL   Lymphocytes Relative 42 12 - 46 %   Lymphs Abs 2.4 0.7 - 4.0 K/uL   Monocytes Relative 10 3 - 12 %   Monocytes Absolute 0.6 0.1 - 1.0 K/uL   Eosinophils Relative 6 (H) 0 - 5 %   Eosinophils Absolute 0.3 0.0 - 0.7 K/uL   Basophils Relative 1 0 - 1 %   Basophils Absolute 0.0 0.0 - 0.1 K/uL  Urine microscopic-add on  Result Value Ref Range   Squamous Epithelial / LPF RARE RARE   WBC, UA 3-6 <3 WBC/hpf   RBC / HPF 0-2 <3 RBC/hpf   Bacteria, UA MANY (A) RARE  CBG, ED  Result Value Ref Range   Glucose-Capillary 157 (H) 70 - 99 mg/dL    EKG Interpretation   Date/Time:  Wednesday June 28 2014 20:48:51 EDT Ventricular Rate:  70 PR Interval:  287 QRS Duration: 146 QT Interval:  434 QTC Calculation: 468 R Axis:   -58 Text Interpretation:  Sinus rhythm Prolonged PR interval Left bundle  branch block When compared with ECG of 06/10/2014, No significant change  was found Confirmed by [facility]  MD, Preslyn Warr ([id number]) on 06/28/2014 11:19:25 PM      MDM   Final diagnoses:  Dizziness  Urinary tract infection without hematuria, site unspecified    Transient dizziness which has resolved. Review of old records shows a prior ED visit on October 20 for dizziness with no etiology found, but symptoms had resolved by the time she was seen in the ED. Routine screening labs will be obtained and orthostatic vital signs will be obtained and patient will be ambulated to see if she is safe  to walk on her own.  Orthostatic vital signs are unremarkable.  Patient was ambulated and was able and played without any dizziness. Workup is significant only for bacturia. Given her dizziness, it seems reasonable to treat this as a possible urinary tract infection. She is allergic to multiple antibiotics. Nitrofurantoin does not appear to be related to any of the things she is allergic to so she is given a prescription for nitrofurantoin. Urine cultures been sent. She is to follow-up with her PCP next week.  Delora Fuel, MD 59/97/74 1423

## 2014-06-29 DIAGNOSIS — R42 Dizziness and giddiness: Secondary | ICD-10-CM | POA: Diagnosis not present

## 2014-06-29 LAB — DIFFERENTIAL
Basophils Absolute: 0 10*3/uL (ref 0.0–0.1)
Basophils Relative: 1 % (ref 0–1)
EOS PCT: 6 % — AB (ref 0–5)
Eosinophils Absolute: 0.3 10*3/uL (ref 0.0–0.7)
LYMPHS ABS: 2.4 10*3/uL (ref 0.7–4.0)
LYMPHS PCT: 42 % (ref 12–46)
Monocytes Absolute: 0.6 10*3/uL (ref 0.1–1.0)
Monocytes Relative: 10 % (ref 3–12)
Neutro Abs: 2.3 10*3/uL (ref 1.7–7.7)
Neutrophils Relative %: 41 % — ABNORMAL LOW (ref 43–77)

## 2014-06-29 LAB — BASIC METABOLIC PANEL
ANION GAP: 6 (ref 5–15)
BUN: 28 mg/dL — ABNORMAL HIGH (ref 6–23)
CO2: 25 mmol/L (ref 19–32)
Calcium: 9.2 mg/dL (ref 8.4–10.5)
Chloride: 105 mmol/L (ref 96–112)
Creatinine, Ser: 0.73 mg/dL (ref 0.50–1.10)
GFR calc Af Amer: 85 mL/min — ABNORMAL LOW (ref >90)
GFR, EST NON AFRICAN AMERICAN: 74 mL/min — AB (ref >90)
Glucose, Bld: 139 mg/dL — ABNORMAL HIGH (ref 70–99)
Potassium: 3.8 mmol/L (ref 3.5–5.1)
Sodium: 136 mmol/L (ref 135–145)

## 2014-06-29 LAB — URINALYSIS, ROUTINE W REFLEX MICROSCOPIC
BILIRUBIN URINE: NEGATIVE
Glucose, UA: NEGATIVE mg/dL
Ketones, ur: NEGATIVE mg/dL
Nitrite: NEGATIVE
PH: 6.5 (ref 5.0–8.0)
PROTEIN: NEGATIVE mg/dL
Specific Gravity, Urine: 1.007 (ref 1.005–1.030)
UROBILINOGEN UA: 0.2 mg/dL (ref 0.0–1.0)

## 2014-06-29 LAB — CBC
HEMATOCRIT: 36.6 % (ref 36.0–46.0)
HEMOGLOBIN: 11.9 g/dL — AB (ref 12.0–15.0)
MCH: 30.6 pg (ref 26.0–34.0)
MCHC: 32.5 g/dL (ref 30.0–36.0)
MCV: 94.1 fL (ref 78.0–100.0)
Platelets: 158 10*3/uL (ref 150–400)
RBC: 3.89 MIL/uL (ref 3.87–5.11)
RDW: 12.6 % (ref 11.5–15.5)
WBC: 5.6 10*3/uL (ref 4.0–10.5)

## 2014-06-29 LAB — URINE MICROSCOPIC-ADD ON

## 2014-06-29 MED ORDER — NITROFURANTOIN MONOHYD MACRO 100 MG PO CAPS: 100.0000 mg | ORAL_CAPSULE | Freq: Two times a day (BID) | ORAL | Status: DC

## 2014-06-29 MED ORDER — NITROFURANTOIN MONOHYD MACRO 100 MG PO CAPS
100.0000 mg | ORAL_CAPSULE | Freq: Once | ORAL | Status: AC
  Administered 2014-06-29: 100 mg via ORAL
  Filled 2014-06-29: qty 1

## 2014-06-29 NOTE — Discharge Instructions (Signed)
Dizziness °Dizziness is a common problem. It is a feeling of unsteadiness or light-headedness. You may feel like you are about to faint. Dizziness can lead to injury if you stumble or fall. A person of any age group can suffer from dizziness, but dizziness is more common in older adults. °CAUSES  °Dizziness can be caused by many different things, including: °· Middle ear problems. °· Standing for too long. °· Infections. °· An allergic reaction. °· Aging. °· An emotional response to something, such as the sight of blood. °· Side effects of medicines. °· Tiredness. °· Problems with circulation or blood pressure. °· Excessive use of alcohol or medicines, or illegal drug use. °· Breathing too fast (hyperventilation). °· An irregular heart rhythm (arrhythmia). °· A low red blood cell count (anemia). °· Pregnancy. °· Vomiting, diarrhea, fever, or other illnesses that cause body fluid loss (dehydration). °· Diseases or conditions such as Parkinson's disease, high blood pressure (hypertension), diabetes, and thyroid problems. °· Exposure to extreme heat. °DIAGNOSIS  °Your health care provider will ask about your symptoms, perform a physical exam, and perform an electrocardiogram (ECG) to record the electrical activity of your heart. Your health care provider may also perform other heart or blood tests to determine the cause of your dizziness. These may include: °· Transthoracic echocardiogram (TTE). During echocardiography, sound waves are used to evaluate how blood flows through your heart. °· Transesophageal echocardiogram (TEE). °· Cardiac monitoring. This allows your health care provider to monitor your heart rate and rhythm in real time. °· Holter monitor. This is a portable device that records your heartbeat and can help diagnose heart arrhythmias. It allows your health care provider to track your heart activity for several days if needed. °· Stress tests by exercise or by giving medicine that makes the heart beat  faster. °TREATMENT  °Treatment of dizziness depends on the cause of your symptoms and can vary greatly. °HOME CARE INSTRUCTIONS  °· Drink enough fluids to keep your urine clear or pale yellow. This is especially important in very hot weather. In older adults, it is also important in cold weather. °· Take your medicine exactly as directed if your dizziness is caused by medicines. When taking blood pressure medicines, it is especially important to get up slowly. °· Rise slowly from chairs and steady yourself until you feel okay. °· In the morning, first sit up on the side of the bed. When you feel okay, stand slowly while holding onto something until you know your balance is fine. °· Move your legs often if you need to stand in one place for a long time. Tighten and relax your muscles in your legs while standing. °· Have someone stay with you for 1-2 days if dizziness continues to be a problem. Do this until you feel you are well enough to stay alone. Have the person call your health care provider if he or she notices changes in you that are concerning. °· Do not drive or use heavy machinery if you feel dizzy. °· Do not drink alcohol. °SEEK IMMEDIATE MEDICAL CARE IF:  °· Your dizziness or light-headedness gets worse. °· You feel nauseous or vomit. °· You have problems talking, walking, or using your arms, hands, or legs. °· You feel weak. °· You are not thinking clearly or you have trouble forming sentences. It may take a friend or family member to notice this. °· You have chest pain, abdominal pain, shortness of breath, or sweating. °· Your vision changes. °· You notice   any bleeding.  You have side effects from medicine that seems to be getting worse rather than better. MAKE SURE YOU:   Understand these instructions.  Will watch your condition.  Will get help right away if you are not doing well or get worse. Document Released: 09/24/2000 Document Revised: 04/05/2013 Document Reviewed: 10/18/2010 [facility]  Patient Information 2015 [place], [place]. This information is not intended to replace advice given to you by your health care provider. Make sure you discuss any questions you have with your health care provider.  Urinary Tract Infection Urinary tract infections (UTIs) can develop anywhere along your urinary tract. Your urinary tract is your body's drainage system for removing wastes and extra water. Your urinary tract includes two kidneys, two ureters, a bladder, and a urethra. Your kidneys are a pair of bean-shaped organs. Each kidney is about the size of your fist. They are located below your ribs, one on each side of your spine. CAUSES Infections are caused by microbes, which are microscopic organisms, including fungi, viruses, and bacteria. These organisms are so small that they can only be seen through a microscope. Bacteria are the microbes that most commonly cause UTIs. SYMPTOMS  Symptoms of UTIs may vary by age and gender of the patient and by the location of the infection. Symptoms in young women typically include a frequent and intense urge to urinate and a painful, burning feeling in the bladder or urethra during urination. Older women and men are more likely to be tired, shaky, and weak and have muscle aches and abdominal pain. A fever may mean the infection is in your kidneys. Other symptoms of a kidney infection include pain in your back or sides below the ribs, nausea, and vomiting. DIAGNOSIS To diagnose a UTI, your caregiver will ask you about your symptoms. Your caregiver also will ask to provide a urine sample. The urine sample will be tested for bacteria and white blood cells. White blood cells are made by your body to help fight infection. TREATMENT  Typically, UTIs can be treated with medication. Because most UTIs are caused by a bacterial infection, they usually can be treated with the use of antibiotics. The choice of antibiotic and length of treatment depend on your symptoms and  the type of bacteria causing your infection. HOME CARE INSTRUCTIONS  If you were prescribed antibiotics, take them exactly as your caregiver instructs you. Finish the medication even if you feel better after you have only taken some of the medication.  Drink enough water and fluids to keep your urine clear or pale yellow.  Avoid caffeine, tea, and carbonated beverages. They tend to irritate your bladder.  Empty your bladder often. Avoid holding urine for long periods of time.  Empty your bladder before and after sexual intercourse.  After a bowel movement, women should cleanse from front to back. Use each tissue only once. SEEK MEDICAL CARE IF:   You have back pain.  You develop a fever.  Your symptoms do not begin to resolve within 3 days. SEEK IMMEDIATE MEDICAL CARE IF:   You have severe back pain or lower abdominal pain.  You develop chills.  You have nausea or vomiting.  You have continued burning or discomfort with urination. MAKE SURE YOU:   Understand these instructions.  Will watch your condition.  Will get help right away if you are not doing well or get worse. Document Released: 01/08/2005 Document Revised: 09/30/2011 Document Reviewed: 05/09/2011 [facility] Patient Information 2015 [place], [place]. This information is not  intended to replace advice given to you by your health care provider. Make sure you discuss any questions you have with your health care provider.  Nitrofurantoin tablets or capsules What is this medicine? NITROFURANTOIN (nye troe fyoor AN toyn) is an antibiotic. It is used to treat urinary tract infections. This medicine may be used for other purposes; ask your health care provider or pharmacist if you have questions. COMMON BRAND NAME(S): Macrobid, Macrodantin, Urotoin What should I tell my health care provider before I take this medicine? They need to know if you have any of these conditions: -anemia -diabetes -glucose-6-phosphate  dehydrogenase deficiency -kidney disease -liver disease -lung disease -other chronic illness -an unusual or allergic reaction to nitrofurantoin, other antibiotics, other medicines, foods, dyes or preservatives -pregnant or trying to get pregnant -breast-feeding How should I use this medicine? Take this medicine by mouth with a glass of water. Follow the directions on the prescription label. Take this medicine with food or milk. Take your doses at regular intervals. Do not take your medicine more often than directed. Do not stop taking except on your doctor's advice. Talk to your pediatrician regarding the use of this medicine in children. While this drug may be prescribed for selected conditions, precautions do apply. Overdosage: If you think you have taken too much of this medicine contact a poison control center or emergency room at once. NOTE: This medicine is only for you. Do not share this medicine with others. What if I miss a dose? If you miss a dose, take it as soon as you can. If it is almost time for your next dose, take only that dose. Do not take double or extra doses. What may interact with this medicine? -antacids containing magnesium trisilicate -probenecid -quinolone antibiotics like ciprofloxacin, lomefloxacin, norfloxacin and ofloxacin -sulfinpyrazone This list may not describe all possible interactions. Give your health care provider a list of all the medicines, herbs, non-prescription drugs, or dietary supplements you use. Also tell them if you smoke, drink alcohol, or use illegal drugs. Some items may interact with your medicine. What should I watch for while using this medicine? Tell your doctor or health care professional if your symptoms do not improve or if you get new symptoms. Drink several glasses of water a day. If you are taking this medicine for a long time, visit your doctor for regular checks on your progress. If you are diabetic, you may get a false positive  result for sugar in your urine with certain brands of urine tests. Check with your doctor. What side effects may I notice from receiving this medicine? Side effects that you should report to your doctor or health care professional as soon as possible: -allergic reactions like skin rash or hives, swelling of the face, lips, or tongue -chest pain -cough -difficulty breathing -dizziness, drowsiness -fever or infection -joint aches or pains -pale or blue-tinted skin -redness, blistering, peeling or loosening of the skin, including inside the mouth -tingling, burning, pain, or numbness in hands or feet -unusual bleeding or bruising -unusually weak or tired -yellowing of eyes or skin Side effects that usually do not require medical attention (report to your doctor or health care professional if they continue or are bothersome): -dark urine -diarrhea -headache -loss of appetite -nausea or vomiting -temporary hair loss This list may not describe all possible side effects. Call your doctor for medical advice about side effects. You may report side effects to FDA at 1-800-FDA-1088. Where should I keep my medicine? Keep out  of the reach of children. Store at room temperature between 15 and 30 degrees C (59 and 86 degrees F). Protect from light. Throw away any unused medicine after the expiration date. NOTE: This sheet is a summary. It may not cover all possible information. If you have questions about this medicine, talk to your doctor, pharmacist, or health care provider.  2015, [employer]. (2007-10-20 15:56:47)

## 2014-07-01 LAB — URINE CULTURE

## 2014-07-02 ENCOUNTER — Telehealth (HOSPITAL_COMMUNITY): Payer: Self-pay

## 2014-07-02 NOTE — Telephone Encounter (Signed)
Post ED Visit - Positive Culture Follow-up  Culture report reviewed by antimicrobial stewardship pharmacist: []  Wes Dulaney, Pharm.D., BCPS [x]  Heide Guile, Pharm.D., BCPS []  Alycia Rossetti, Pharm.D., BCPS []  [place], [place].D., BCPS, AAHIVP []  [name], Pharm.D., BCPS, AAHIVP []  Isac Sarna, Pharm.D., BCPS  Positive Urine culture, >/= 100,000 colonies -> E Coli Treated with Nitrofurantoin, organism sensitive to the same and no further patient follow-up is required at this time.  Dortha Kern 07/02/2014, 8:02 PM

## 2014-07-04 ENCOUNTER — Telehealth: Payer: Self-pay | Admitting: Internal Medicine

## 2014-07-04 NOTE — Telephone Encounter (Signed)
E coli sensitive to Macrobid that she was given Thx

## 2014-07-04 NOTE — Telephone Encounter (Signed)
Pt called in and said that she was in the ER for uti and they did culture.  She was to call our office to make sure that DR in ER put her on correct meds????

## 2014-07-05 NOTE — Telephone Encounter (Signed)
Pt informed

## 2014-07-05 NOTE — Telephone Encounter (Signed)
Pt called back in [place].  She has called 2 or 3 times today.  Just FYI

## 2014-07-05 NOTE — Telephone Encounter (Signed)
Left mess for patient to call back.  

## 2014-07-06 ENCOUNTER — Ambulatory Visit (INDEPENDENT_AMBULATORY_CARE_PROVIDER_SITE_OTHER)
Admission: RE | Admit: 2014-07-06 | Discharge: 2014-07-06 | Disposition: A | Discharge status: Home / Self Care | Payer: MEDICARE | Source: Ambulatory Visit | Attending: Internal Medicine | Admitting: Internal Medicine

## 2014-07-06 ENCOUNTER — Ambulatory Visit (INDEPENDENT_AMBULATORY_CARE_PROVIDER_SITE_OTHER): Payer: MEDICARE | Admitting: Internal Medicine

## 2014-07-06 ENCOUNTER — Other Ambulatory Visit (INDEPENDENT_AMBULATORY_CARE_PROVIDER_SITE_OTHER): Payer: MEDICARE

## 2014-07-06 ENCOUNTER — Other Ambulatory Visit: Payer: Self-pay | Admitting: Physical Medicine and Rehabilitation

## 2014-07-06 ENCOUNTER — Encounter: Payer: Self-pay | Admitting: Internal Medicine

## 2014-07-06 VITALS — BP 112/70 | HR 73 | Temp 97.7°F | Ht 65.0 in | Wt 152.0 lb

## 2014-07-06 DIAGNOSIS — N390 Urinary tract infection, site not specified: Secondary | ICD-10-CM

## 2014-07-06 DIAGNOSIS — I63519 Cerebral infarction due to unspecified occlusion or stenosis of unspecified middle cerebral artery: Secondary | ICD-10-CM

## 2014-07-06 DIAGNOSIS — R0989 Other specified symptoms and signs involving the circulatory and respiratory systems: Secondary | ICD-10-CM | POA: Diagnosis not present

## 2014-07-06 DIAGNOSIS — M1612 Unilateral primary osteoarthritis, left hip: Secondary | ICD-10-CM

## 2014-07-06 DIAGNOSIS — J3089 Other allergic rhinitis: Secondary | ICD-10-CM | POA: Diagnosis not present

## 2014-07-06 DIAGNOSIS — R059 Cough, unspecified: Secondary | ICD-10-CM

## 2014-07-06 DIAGNOSIS — R05 Cough: Secondary | ICD-10-CM

## 2014-07-06 DIAGNOSIS — M25552 Pain in left hip: Secondary | ICD-10-CM

## 2014-07-06 DIAGNOSIS — R0689 Other abnormalities of breathing: Secondary | ICD-10-CM

## 2014-07-06 DIAGNOSIS — B9620 Unspecified Escherichia coli [E. coli] as the cause of diseases classified elsewhere: Secondary | ICD-10-CM

## 2014-07-06 DIAGNOSIS — R739 Hyperglycemia, unspecified: Secondary | ICD-10-CM

## 2014-07-06 LAB — HEMOGLOBIN A1C: Hgb A1c MFr Bld: 6.7 % — ABNORMAL HIGH (ref 4.6–6.5)

## 2014-07-06 NOTE — Progress Notes (Signed)
   Subjective:    Patient ID: Kayla Davis, female    DOB: 05-30-1922, 81 y.o.   MRN: [id number]  HPI She was seen in the emergency room 3/172016 for urinary tract infection. Nitrofurantoin was prescribed. Review of the culture documents Escherichia coli which is sensitive to nitrofurantoin.  She had nausea and fatigue prior to the urinary tract infection which has improved.  She does have some cough and some residual malaise. As of 07/04/14. She's had one episode of expectorating scant yellow sputum in the morning. The cough which is worse at night and associated with rhinitis with clear drainage. She has pressure in her teeth. She has nasal congestion. She has had some sneezing and hoarseness. She has no other upper history tract infection symptoms  CXray 2/27/16revealed chronic bronchitic markings; no active disease.  Labs performed in the ER revealed  BUN was 28, GFR 74, white count 5600, and eosinophil count 6%. Random glucoses have been 99-1 63. There is no current A1c in the chart.  Review of Systems Frontal headache, facial pain , nasal purulence, sore throat , otic pain or otic discharge denied. No fever , chills or sweats. Dysuria, pyuria, hematuria, frequency, nocturia or polyuria are denied.     Objective:   Physical Exam  Pertinent positive findings include: She has bilateral hearing aids. The tympanic membranes are scarred.  She has a slight head tremor.  There is mild nasal septum inflammation.  She has upper plate and lower partial.  She has a rough grade 1 systolic murmur and increased second heart sound.  She has rales at the bases greater in the left lower lobe than the right.  Pedal pulses are decreased.   General appearance :adequately nourished; in no distress. Eyes: No conjunctival inflammation or scleral icterus is present. Oral exam:  Lips and gums are healthy appearing.There is no oropharyngeal erythema or exudate noted.  Heart:  Normal rate and regular  rhythm. S1 and S2 normal without gallop, click, rub or other extra sounds   Lungs:No increased work of breathing.  Abdomen: bowel sounds normal, soft and non-tender without masses, organomegaly or hernias noted.  No guarding or rebound. No flank tenderness to percussion. Vascular : all pulses equal ; no bruits present. Skin:Warm & dry.  Intact without suspicious lesions or rashes ; no tenting or jaundice  Lymphatic: No lymphadenopathy is noted about the head, neck, axilla Neuro: Strength, tone decreased        Assessment & Plan:  #1 rhinitis with no evidence of rhinosinusitis  #2 cough most likely related to postnasal drainage #3 abnormal breath sounds; this probably represents chronic bronchitic changes rather than acute process based on CXray report review  Plan: See orders and recommendations

## 2014-07-06 NOTE — Progress Notes (Signed)
Pre visit review using our clinic review tool, if applicable. No additional management support is needed unless otherwise documented below in the visit note. 

## 2014-07-06 NOTE — Patient Instructions (Signed)
Plain Mucinex (NOT D) for thick secretions ;force NON dairy fluids .   Nasal cleansing in the shower as discussed with lather of mild shampoo.After 10 seconds wash off lather while  exhaling through nostrils. Make sure that all residual soap is removed to prevent irritation.  Flonase OR Nasacort AQ 1 spray in each nostril twice a day as needed. Use the "crossover" technique into opposite nostril spraying toward opposite ear @ 45 degree angle, not straight up into nostril.  Plain Allegra (NOT D )  160 daily , Loratidine 10 mg , OR Zyrtec 10 mg @ bedtime  as needed for itchy eyes & sneezing.   Your next office appointment will be determined based upon review of your pending labs & xrays  Those instructions will be transmitted to you by mail for your records.  Critical results will be called.   Followup as needed for any active or acute issue. Please report any significant change in your symptoms.

## 2014-07-07 ENCOUNTER — Ambulatory Visit
Admission: RE | Admit: 2014-07-07 | Discharge: 2014-07-07 | Disposition: A | Discharge status: Home / Self Care | Payer: MEDICARE | Source: Ambulatory Visit | Attending: Physical Medicine and Rehabilitation | Admitting: Physical Medicine and Rehabilitation

## 2014-07-07 ENCOUNTER — Other Ambulatory Visit: Payer: Self-pay | Admitting: Physical Medicine and Rehabilitation

## 2014-07-07 DIAGNOSIS — M25552 Pain in left hip: Secondary | ICD-10-CM

## 2014-07-07 DIAGNOSIS — M1612 Unilateral primary osteoarthritis, left hip: Secondary | ICD-10-CM

## 2014-07-07 IMAGING — XA DG FLUORO GUIDE NDL PLC/BX
1 series · 1 of 1 positions shown · non-contrast
Comparison: none

CLINICAL DATA: Left hip pain.  Advanced left hip osteoarthritis.

[Series 1: ortho standard · 1 of 1 slices shown]
[im 1/1]
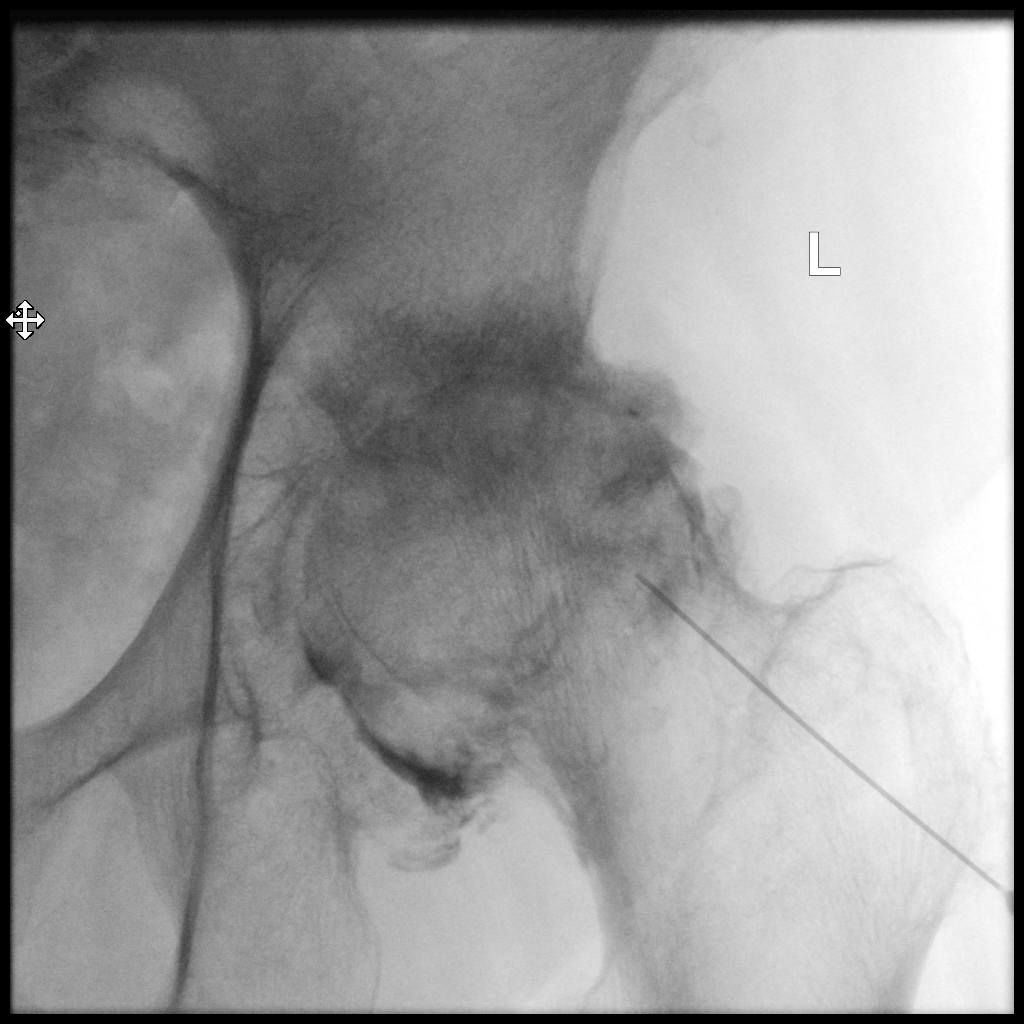

[1 of 1 positions shown; findings below may reference images not displayed]

EXAM:
LEFT HIP INJECTION UNDER FLUOROSCOPY

FLUOROSCOPY TIME:  Radiation Exposure Index (as provided by the
fluoroscopic device): 75.54 microGray*m2

Fluoroscopy Time (in minutes and seconds):  0 minutes 30 seconds

PROCEDURE:
Overlying skin prepped with Betadine, draped in the usual sterile
fashion, and infiltrated locally with buffered Lidocaine. A22 gauge
spinal needle was advanced to the lateral half of the left femoral
head- neck junction. 1 ml of Lidocaine injected easily. Diagnostic
injection of iodinated contrast demonstrates intra-articular spread
without intravascular component.

120 mg Depo-Medrol and 4 mL of 1% lidocaine were then administered.
No immediate complication.
IMPRESSION: Technically successful left hip injection under fluoroscopy.

## 2014-07-07 MED ORDER — METHYLPREDNISOLONE ACETATE 40 MG/ML INJ SUSP (RADIOLOG
120.0000 mg | Freq: Once | INTRAMUSCULAR | Status: AC
  Administered 2014-07-07: 120 mg via INTRA_ARTICULAR

## 2014-07-07 MED ORDER — IOHEXOL 180 MG/ML  SOLN
1.0000 mL | Freq: Once | INTRAMUSCULAR | Status: AC | PRN
  Administered 2014-07-07: 1 mL via INTRA_ARTICULAR

## 2014-07-07 NOTE — Discharge Instructions (Signed)

## 2014-07-18 ENCOUNTER — Ambulatory Visit (INDEPENDENT_AMBULATORY_CARE_PROVIDER_SITE_OTHER): Payer: MEDICARE | Admitting: Internal Medicine

## 2014-07-18 ENCOUNTER — Encounter: Payer: Self-pay | Admitting: Internal Medicine

## 2014-07-18 VITALS — BP 140/78 | HR 64 | Wt 151.0 lb

## 2014-07-18 DIAGNOSIS — I10 Essential (primary) hypertension: Secondary | ICD-10-CM

## 2014-07-18 DIAGNOSIS — E038 Other specified hypothyroidism: Secondary | ICD-10-CM

## 2014-07-18 DIAGNOSIS — E034 Atrophy of thyroid (acquired): Secondary | ICD-10-CM

## 2014-07-18 DIAGNOSIS — I63519 Cerebral infarction due to unspecified occlusion or stenosis of unspecified middle cerebral artery: Secondary | ICD-10-CM

## 2014-07-18 DIAGNOSIS — J189 Pneumonia, unspecified organism: Secondary | ICD-10-CM

## 2014-07-18 DIAGNOSIS — G459 Transient cerebral ischemic attack, unspecified: Secondary | ICD-10-CM | POA: Diagnosis not present

## 2014-07-18 NOTE — Assessment & Plan Note (Signed)
Remote On ASA, Simvastatin

## 2014-07-18 NOTE — Assessment & Plan Note (Signed)
Levothroid po 

## 2014-07-18 NOTE — Assessment & Plan Note (Signed)
Losartan HCT 

## 2014-07-18 NOTE — Progress Notes (Signed)
   Subjective:    HPI     The patient presents for a follow-up of  chronic hypertension, TIA, LBP, gait disorder, anemia, spinal stenosis w/gait disorder, chronic dyslipidemia.  F/u recurrent UTI w/MRSA 06/21/13 Cx and E coli in 3/16. F/u PNA - pt saw Dr Linna Darner  S/p pacemaker  BP Readings from Last 3 Encounters:  07/18/14 140/78  07/06/14 112/70  06/29/14 170/70   Wt Readings from Last 3 Encounters:  07/18/14 151 lb (68.493 kg)  07/06/14 152 lb (68.947 kg)  06/10/14 152 lb (68.947 kg)     Review of Systems  Constitutional: Negative for chills, activity change, appetite change, fatigue and unexpected weight change.  HENT: Negative for congestion, mouth sores, rhinorrhea, sinus pressure, sore throat, tinnitus and voice change.   Eyes: Negative for visual disturbance.  Respiratory: Negative for cough and chest tightness.   Gastrointestinal: Negative for nausea and abdominal pain.  Genitourinary: Negative for frequency, difficulty urinating and vaginal pain.  Musculoskeletal: Positive for back pain, arthralgias and gait problem. Negative for neck stiffness.  Skin: Negative for pallor and rash.  Neurological: Negative for dizziness, tremors, weakness, numbness and headaches.  Psychiatric/Behavioral: Negative for suicidal ideas, confusion, sleep disturbance and dysphoric mood.       Objective:   Physical Exam  Constitutional: She appears well-developed. No distress.  HENT:  Head: Normocephalic.  Right Ear: External ear normal.  Left Ear: External ear normal.  Nose: Nose normal.  Mouth/Throat: Oropharynx is clear and moist.  Eyes: Conjunctivae are normal. Pupils are equal, round, and reactive to light. Right eye exhibits no discharge. Left eye exhibits no discharge.  Neck: Normal range of motion. Neck supple. No JVD present. No tracheal deviation present. No thyromegaly present.  Cardiovascular: Normal rate, regular rhythm and normal heart sounds.   Pulmonary/Chest: No  stridor. No respiratory distress. She has no wheezes.  Abdominal: Soft. Bowel sounds are normal. She exhibits no distension and no mass. There is no tenderness. There is no rebound and no guarding.  Musculoskeletal: She exhibits tenderness. She exhibits no edema.  Lymphadenopathy:    She has no cervical adenopathy.  Neurological: She displays normal reflexes. No cranial nerve deficit. She exhibits normal muscle tone. Coordination abnormal.  Skin: No rash noted. No erythema.  Psychiatric: She has a normal mood and affect. Her behavior is normal. Judgment and thought content normal.  Walker Dry skin on back and moles  Lab Results  Component Value Date   WBC 5.6 06/28/2014   HGB 11.9* 06/28/2014   HCT 36.6 06/28/2014   PLT 158 06/28/2014   GLUCOSE 139* 06/28/2014   CHOL 161 01/17/2014   TRIG 56 01/17/2014   HDL 68 01/17/2014   LDLDIRECT 143.0 10/07/2007   LDLCALC 82 01/17/2014   ALT 19 04/21/2014   AST 24 04/21/2014   NA 136 06/28/2014   K 3.8 06/28/2014   CL 105 06/28/2014   CREATININE 0.73 06/28/2014   BUN 28* 06/28/2014   CO2 25 06/28/2014   TSH 0.70 04/21/2014   INR 1.04 02/11/2010   HGBA1C 6.7* 07/06/2014          Assessment & Plan:

## 2014-07-18 NOTE — Progress Notes (Signed)
Pre visit review using our clinic review tool, if applicable. No additional management support is needed unless otherwise documented below in the visit note. 

## 2014-07-18 NOTE — Assessment & Plan Note (Addendum)
Clinically resolved CXR in 4-6 wks

## 2014-08-08 ENCOUNTER — Ambulatory Visit: Payer: MEDICARE | Admitting: Vascular Surgery

## 2014-08-08 ENCOUNTER — Other Ambulatory Visit (HOSPITAL_COMMUNITY): Payer: MEDICARE

## 2014-08-28 ENCOUNTER — Telehealth: Payer: Self-pay | Admitting: Internal Medicine

## 2014-08-28 DIAGNOSIS — N390 Urinary tract infection, site not specified: Secondary | ICD-10-CM

## 2014-08-28 NOTE — Telephone Encounter (Signed)
Patient is requesting a lab order to be entered into the system to check for UTI.  Patient states she had one the last time she seen Dr. Alain Marion but he did not prescribe her an antibiotic.  Patient states she will be coming in on Thursday to Radiology.  She would like to do lab them.

## 2014-08-29 NOTE — Telephone Encounter (Signed)
Spoke to patient advised of labs placed

## 2014-08-29 NOTE — Telephone Encounter (Signed)
OK UA Thx

## 2014-08-29 NOTE — Telephone Encounter (Signed)
Notified pt UA has been ordered../lmb 

## 2014-08-30 ENCOUNTER — Other Ambulatory Visit (INDEPENDENT_AMBULATORY_CARE_PROVIDER_SITE_OTHER): Payer: MEDICARE

## 2014-08-30 ENCOUNTER — Ambulatory Visit (INDEPENDENT_AMBULATORY_CARE_PROVIDER_SITE_OTHER)
Admission: RE | Admit: 2014-08-30 | Discharge: 2014-08-30 | Disposition: A | Discharge status: Home / Self Care | Payer: MEDICARE | Source: Ambulatory Visit | Attending: Internal Medicine | Admitting: Internal Medicine

## 2014-08-30 ENCOUNTER — Other Ambulatory Visit: Payer: Self-pay | Admitting: Internal Medicine

## 2014-08-30 DIAGNOSIS — J189 Pneumonia, unspecified organism: Secondary | ICD-10-CM

## 2014-08-30 DIAGNOSIS — N390 Urinary tract infection, site not specified: Secondary | ICD-10-CM | POA: Diagnosis not present

## 2014-08-30 DIAGNOSIS — I517 Cardiomegaly: Secondary | ICD-10-CM | POA: Diagnosis not present

## 2014-08-30 LAB — URINALYSIS, ROUTINE W REFLEX MICROSCOPIC
Bilirubin Urine: NEGATIVE
Ketones, ur: NEGATIVE
NITRITE: POSITIVE — AB
PH: 7 (ref 5.0–8.0)
Specific Gravity, Urine: 1.02 (ref 1.000–1.030)
TOTAL PROTEIN, URINE-UPE24: NEGATIVE
Urine Glucose: NEGATIVE
Urobilinogen, UA: 1 (ref 0.0–1.0)

## 2014-08-30 MED ORDER — NITROFURANTOIN MONOHYD MACRO 100 MG PO CAPS
100.0000 mg | ORAL_CAPSULE | Freq: Two times a day (BID) | ORAL | Status: DC
Start: 2014-08-30 — End: 2014-08-31

## 2014-08-31 ENCOUNTER — Other Ambulatory Visit: Payer: Self-pay

## 2014-08-31 MED ORDER — NITROFURANTOIN MONOHYD MACRO 100 MG PO CAPS: 100.0000 mg | ORAL_CAPSULE | Freq: Two times a day (BID) | ORAL | Status: DC

## 2014-09-01 NOTE — Telephone Encounter (Signed)
Pt called in and wanted to know the results of her chest xray?

## 2014-09-04 ENCOUNTER — Ambulatory Visit (INDEPENDENT_AMBULATORY_CARE_PROVIDER_SITE_OTHER): Payer: MEDICARE | Admitting: Internal Medicine

## 2014-09-04 ENCOUNTER — Encounter: Payer: Self-pay | Admitting: Internal Medicine

## 2014-09-04 VITALS — BP 144/74 | HR 72 | Ht 65.0 in | Wt 150.2 lb

## 2014-09-04 DIAGNOSIS — I63519 Cerebral infarction due to unspecified occlusion or stenosis of unspecified middle cerebral artery: Secondary | ICD-10-CM | POA: Diagnosis not present

## 2014-09-04 DIAGNOSIS — I495 Sick sinus syndrome: Secondary | ICD-10-CM

## 2014-09-04 DIAGNOSIS — I10 Essential (primary) hypertension: Secondary | ICD-10-CM | POA: Diagnosis not present

## 2014-09-04 LAB — CUP PACEART INCLINIC DEVICE CHECK
Battery Impedance: 252 Ohm
Battery Remaining Longevity: 123 mo
Battery Voltage: 2.78 V
Brady Statistic AP VS Percent: 26 %
Brady Statistic AS VP Percent: 0 %
Lead Channel Pacing Threshold Amplitude: 0.5 V
Lead Channel Pacing Threshold Amplitude: 0.75 V
Lead Channel Pacing Threshold Pulse Width: 0.4 ms
Lead Channel Pacing Threshold Pulse Width: 0.4 ms
Lead Channel Sensing Intrinsic Amplitude: 15.67 mV
Lead Channel Setting Pacing Amplitude: 2 V
Lead Channel Setting Pacing Amplitude: 2.5 V
MDC IDC MSMT LEADCHNL RA IMPEDANCE VALUE: 480 Ohm
MDC IDC MSMT LEADCHNL RA SENSING INTR AMPL: 2 mV
MDC IDC MSMT LEADCHNL RV IMPEDANCE VALUE: 476 Ohm
MDC IDC SESS DTM: 20160523171305
MDC IDC SET LEADCHNL RV PACING PULSEWIDTH: 0.4 ms
MDC IDC SET LEADCHNL RV SENSING SENSITIVITY: 5.6 mV
MDC IDC STAT BRADY AP VP PERCENT: 0 %
MDC IDC STAT BRADY AS VS PERCENT: 73 %

## 2014-09-04 NOTE — Progress Notes (Signed)
PCP: [name], MD  Kayla Davis is a 81 y.o. female who presents today for routine electrophysiology followup.  Since last being seen in our clinic, the patient reports doing very well.  Today, she denies symptoms of palpitations, chest pain, shortness of breath,  dizziness, presyncope, or syncope.  She has chronic pain and swelling in her L leg.  The patient is otherwise without complaint today.   Past Medical History  Diagnosis Date  . Hypertension   . Hyperlipidemia   . Aortic valve disorders   . Unspecified hereditary and idiopathic peripheral neuropathy   . Other abnormal glucose   . Unspecified hearing loss   . Unspecified chronic bronchitis   . Unspecified hypothyroidism   . Unspecified gastritis and gastroduodenitis without mention of hemorrhage   . Diverticulosis of colon (without mention of hemorrhage)   . Irritable bowel syndrome   . Osteoarthrosis, unspecified whether generalized or localized, unspecified site   . Backache, unspecified   . Pain in limb   . Osteoporosis, unspecified   . Herpes zoster with other nervous system complications(053.19)   . Anxiety state, unspecified   . Anemia, unspecified   . Bradycardia     s/p PPM   Past Surgical History  Procedure Laterality Date  . Pacemaker insertion      MDT  . Appendectomy    . Cholecystectomy    . Tonsillectomy    . Vaginal hysterectomy    . Cataract extraction, bilateral  end of June 2013  . Cataract extraction, bilateral  beginning of July 2013  . Endarterectomy Right 01/20/2014    Procedure: RIGHT CAROTID ARTERY ENDARTERECTOMY WITH DACRON PATCH ANGIOPLASTY;  Surgeon: Mal Misty, MD;  Location: [facility] OR;  Service: Vascular;  Laterality: Right;    Current Outpatient Prescriptions  Medication Sig Dispense Refill  . acetaminophen (TYLENOL) 500 MG tablet Take 500 mg by mouth daily as needed for moderate pain (pain).     [name] aspirin 325 MG tablet Take 325 mg by mouth every morning.     .  cholecalciferol (VITAMIN D) 1000 UNITS tablet Take 1,000 Units by mouth daily.      . Cyanocobalamin (B-12) 2500 MCG TABS Take 2,500 mcg by mouth daily.     [name] glucose blood (ONE TOUCH TEST STRIPS) test strip Use as instructed 50 each 12  . ketotifen (ZADITOR) 0.025 % ophthalmic solution Place 1 drop into both eyes 2 (two) times daily.    . Lancets (ONETOUCH ULTRASOFT) lancets Use as instructed 100 each 12  . levothyroxine (SYNTHROID) 125 MCG tablet Take 1 tablet (125 mcg total) by mouth daily before breakfast. 90 tablet 3  . loratadine (CLARITIN) 10 MG tablet Take 10 mg by mouth every other day.    . losartan-hydrochlorothiazide (HYZAAR) 50-12.5 MG per tablet Take 0.5 tablets by mouth every morning.    . Misc Natural Products (OSTEO BI-FLEX ADV DOUBLE ST) CAPS Take 1 capsule by mouth daily.    . Multiple Vitamins-Minerals (PRESERVISION AREDS PO) Take 1 tablet by mouth daily.    . nitrofurantoin, macrocrystal-monohydrate, (MACROBID) 100 MG capsule Take 1 capsule (100 mg total) by mouth 2 (two) times daily. 10 capsule 0  . OVER THE COUNTER MEDICATION Take 1 tablet by mouth daily. Flex-aid    . polyethylene glycol (MIRALAX / GLYCOLAX) packet Take 17 g by mouth daily as needed for moderate constipation.     . pregabalin (LYRICA) 75 MG capsule Take 1 capsule (75 mg total) by mouth at bedtime. Sultan  capsule 3  . simvastatin (ZOCOR) 40 MG tablet Take 1 tablet (40 mg total) by mouth daily. 90 tablet 3  . sodium chloride (OCEAN) 0.65 % SOLN nasal spray Place 1 spray into both nostrils 2 (two) times daily as needed for congestion.    . [DISCONTINUED] valsartan-hydrochlorothiazide (DIOVAN-HCT) 80-12.5 MG per tablet TAKE 1 TABLET BY MOUTH DAILY 90 tablet 3   No current facility-administered medications for this visit.    Physical Exam: Filed Vitals:   09/04/14 1407  BP: 144/74  Pulse: 72  Height: 5\' 5"  (1.651 m)  Weight: 68.13 kg (150 lb 3.2 oz)    GEN- The patient is elderly appearing, alert and  oriented x 3 today.   Head- normocephalic, atraumatic Neck- R CEA site is healing well Eyes-  Sclera clear, conjunctiva pink Ears- hearing intact Oropharynx- clear Lungs- Clear to ausculation bilaterally, normal work of breathing Chest- pacemaker pocket is well healed Heart- Regular rate and rhythm  GI- soft, NT, ND, + BS Extremities- no clubbing, cyanosis, +1 L leg edema   Pacemaker interrogation- reviewed in detail today,  See PACEART report  Assessment and Plan:  1. Sick sinus bradycardia Normal pacemaker function See Claudia Desanctis Art report  2. Venous insufficiency Support hose encouraged  3. HTN Stable No change required today  I will see in a year carelink

## 2014-09-04 NOTE — Patient Instructions (Signed)
Medication Instructions:  Your physician recommends that you continue on your current medications as directed. Please refer to the Current Medication list given to you today.   Labwork: None ordered  Testing/Procedures: None ordered  Follow-Up: Your physician wants you to follow-up in: 12 months with Dr [name] Dennis Bast will receive a reminder letter in the mail two months in advance. If you don't receive a letter, please call our office to schedule the follow-up appointment.  Remote monitoring is used to monitor your Pacemaker or ICD from home. This monitoring reduces the number of office visits required to check your device to one time per year. It allows [place] to keep an eye on the functioning of your device to ensure it is working properly. You are scheduled for a device check from home on 12/04/14. You may send your transmission at any time that day. If you have a wireless device, the transmission will be sent automatically. After your physician reviews your transmission, you will receive a postcard with your next transmission date.    Any Other Special Instructions Will Be Listed Below (If Applicable).

## 2014-09-05 NOTE — Telephone Encounter (Signed)
CXR was ok Thx

## 2014-09-05 NOTE — Telephone Encounter (Signed)
Left detailed mess informing pt of below.  

## 2014-09-08 ENCOUNTER — Encounter: Payer: Self-pay | Admitting: Vascular Surgery

## 2014-09-12 ENCOUNTER — Ambulatory Visit (INDEPENDENT_AMBULATORY_CARE_PROVIDER_SITE_OTHER): Payer: MEDICARE | Admitting: Vascular Surgery

## 2014-09-12 ENCOUNTER — Ambulatory Visit (HOSPITAL_COMMUNITY)
Admission: RE | Admit: 2014-09-12 | Discharge: 2014-09-12 | Disposition: A | Discharge status: Home / Self Care | Payer: MEDICARE | Source: Ambulatory Visit | Attending: Vascular Surgery | Admitting: Vascular Surgery

## 2014-09-12 ENCOUNTER — Encounter: Payer: Self-pay | Admitting: Vascular Surgery

## 2014-09-12 VITALS — BP 144/73 | HR 68 | Temp 97.0°F | Resp 16 | Ht 65.0 in | Wt 150.0 lb

## 2014-09-12 DIAGNOSIS — I6529 Occlusion and stenosis of unspecified carotid artery: Secondary | ICD-10-CM | POA: Insufficient documentation

## 2014-09-12 DIAGNOSIS — I6521 Occlusion and stenosis of right carotid artery: Secondary | ICD-10-CM

## 2014-09-12 DIAGNOSIS — Z48812 Encounter for surgical aftercare following surgery on the circulatory system: Secondary | ICD-10-CM | POA: Insufficient documentation

## 2014-09-12 NOTE — Addendum Note (Signed)
Addended by: Mena Goes on: 09/12/2014 05:09 PM   Modules accepted: Orders

## 2014-09-12 NOTE — Progress Notes (Signed)
Subjective:     Patient ID: Kayla Davis, female   DOB: [birth year], 81 y.o.   MRN: [id number]  HPI this 81 year old female returns 6 months post right carotid endarterectomy for severe right ICA stenosis and episodes of amaurosis fugax right eye. Her visual symptoms have completely resolved since her surgery. She takes one aspirin per day. She denies any episodes of lateralizing weakness, aphasia, amaurosis fugax, diplopia, blurred vision, or syncope. She has no history of stroke.  Past Medical History  Diagnosis Date  . Hypertension   . Hyperlipidemia   . Aortic valve disorders   . Unspecified hereditary and idiopathic peripheral neuropathy   . Other abnormal glucose   . Unspecified hearing loss   . Unspecified chronic bronchitis   . Unspecified hypothyroidism   . Unspecified gastritis and gastroduodenitis without mention of hemorrhage   . Diverticulosis of colon (without mention of hemorrhage)   . Irritable bowel syndrome   . Osteoarthrosis, unspecified whether generalized or localized, unspecified site   . Backache, unspecified   . Pain in limb   . Osteoporosis, unspecified   . Herpes zoster with other nervous system complications(053.19)   . Anxiety state, unspecified   . Anemia, unspecified   . Bradycardia     s/p PPM    History  Substance Use Topics  . Smoking status: Never Smoker   . Smokeless tobacco: Never Used  . Alcohol Use: No    Family History  Problem Relation Age of Onset  . Heart disease Father   . Cancer Paternal Grandmother   . Diabetes Father   . Heart disease Mother     Allergies  Allergen Reactions  . Penicillins Anaphylaxis    REACTION: swells up all over body, pt states she almost died  . Alprazolam      REACTION: eyes swell up  . Bactrim [Sulfamethoxazole-Trimethoprim]     Unable to remember the side effects  . Ceclor [Cefaclor]     Unsure of the side effects  . Cephalexin     Unsure of the side effects  . Ciprofloxacin Itching and  Swelling    Pt states that she tolerated po cipro well, but not the higher dose given IV.  [name] Clindamycin/Lincomycin     Upset stomach  . Hydrocodone     Unsure of reaction to med  . Noroxin [Norfloxacin]     Unsure of side effects  . Sulfonamide Derivatives     REACTION: itching  . Tetracyclines & Related     Unsure of the reaction to this medication  . Tramadol     Did not like how she felt     Current outpatient prescriptions:  .  acetaminophen (TYLENOL) 500 MG tablet, Take 500 mg by mouth daily as needed for moderate pain (pain). , Disp: , Rfl:  .  aspirin 325 MG tablet, Take 325 mg by mouth every morning. , Disp: , Rfl:  .  cholecalciferol (VITAMIN D) 1000 UNITS tablet, Take 1,000 Units by mouth daily.  , Disp: , Rfl:  .  Cyanocobalamin (B-12) 2500 MCG TABS, Take 2,500 mcg by mouth daily. , Disp: , Rfl:  .  glucose blood (ONE TOUCH TEST STRIPS) test strip, Use as instructed, Disp: 50 each, Rfl: 12 .  ketotifen (ZADITOR) 0.025 % ophthalmic solution, Place 1 drop into both eyes 2 (two) times daily., Disp: , Rfl:  .  Lancets (ONETOUCH ULTRASOFT) lancets, Use as instructed, Disp: 100 each, Rfl: 12 .  levothyroxine (SYNTHROID) 125 MCG tablet,  Take 1 tablet (125 mcg total) by mouth daily before breakfast., Disp: 90 tablet, Rfl: 3 .  loratadine (CLARITIN) 10 MG tablet, Take 10 mg by mouth every other day., Disp: , Rfl:  .  losartan-hydrochlorothiazide (HYZAAR) 50-12.5 MG per tablet, Take 0.5 tablets by mouth every morning., Disp: , Rfl:  .  Misc Natural Products (OSTEO BI-FLEX ADV DOUBLE ST) CAPS, Take 1 capsule by mouth daily., Disp: , Rfl:  .  Multiple Vitamins-Minerals (PRESERVISION AREDS PO), Take 1 tablet by mouth daily., Disp: , Rfl:  .  OVER THE COUNTER MEDICATION, Take 1 tablet by mouth daily. Flex-aid, Disp: , Rfl:  .  polyethylene glycol (MIRALAX / GLYCOLAX) packet, Take 17 g by mouth daily as needed for moderate constipation. , Disp: , Rfl:  .  pregabalin (LYRICA) 75 MG  capsule, Take 1 capsule (75 mg total) by mouth at bedtime., Disp: 90 capsule, Rfl: 3 .  simvastatin (ZOCOR) 40 MG tablet, Take 1 tablet (40 mg total) by mouth daily., Disp: 90 tablet, Rfl: 3 .  sodium chloride (OCEAN) 0.65 % SOLN nasal spray, Place 1 spray into both nostrils 2 (two) times daily as needed for congestion., Disp: , Rfl:  .  nitrofurantoin, macrocrystal-monohydrate, (MACROBID) 100 MG capsule, Take 1 capsule (100 mg total) by mouth 2 (two) times daily. (Patient not taking: Reported on 09/12/2014), Disp: 10 capsule, Rfl: 0 .  [DISCONTINUED] valsartan-hydrochlorothiazide (DIOVAN-HCT) 80-12.5 MG per tablet, TAKE 1 TABLET BY MOUTH DAILY, Disp: 90 tablet, Rfl: 3  Filed Vitals:   09/12/14 1438 09/12/14 1444  BP: 130/66 144/73  Pulse: 73 68  Temp: 97 F (36.1 C)   TempSrc: Oral   Resp: 16   Height: 5\' 5"  (1.651 m)   Weight: 150 lb (68.04 kg)   SpO2: 98%     Body mass index is 24.96 kg/(m^2).           Review of Systems denies chest pain, dyspnea on exertion,   PND, orthopnea. Patient does have pacemaker for the past 4 years. Patient also has severe osteoarthritis left hip which causes significant pain with ambulation. Ambulates with a walker. Other systems negative and a complete review of systems Objective:   Physical Exam BP 144/73 mmHg  Pulse 68  Temp(Src) 97 F (36.1 C) (Oral)  Resp 16  Ht 5\' 5"  (1.651 m)  Wt 150 lb (68.04 kg)  BMI 24.96 kg/m2  SpO2 98%  Gen.-alert and oriented x3 in no apparent distress HEENT normal for age Lungs no rhonchi or wheezing Cardiovascular regular rhythm no murmurs carotid pulses 3+ palpable no bruits audible Abdomen soft nontender no palpable masses Musculoskeletal free of  major deformities Skin clear -no rashes Neurologic normal Lower extremities 3+ femoral and dorsalis pedis pulses palpable bilaterally with 1+ edema  Today I ordered a carotid duplex exam which I reviewed and interpreted. There is no evidence of restenosis at  the right carotid endarterectomy site in the left internal carotid appears to be relatively free of disease.      Assessment:     Doing well 6 months post right carotid endarterectomy for severe right ICA stenosis with amaurosis fugax preoperatively now resolved Patient with permanent pacemaker Severe osteoarthritis left hip    Plan:     Return in 6 months with follow-up carotid duplex exam and see nurse practitioner Continue daily aspirin

## 2014-09-19 DIAGNOSIS — M47816 Spondylosis without myelopathy or radiculopathy, lumbar region: Secondary | ICD-10-CM | POA: Diagnosis not present

## 2014-09-19 DIAGNOSIS — M1612 Unilateral primary osteoarthritis, left hip: Secondary | ICD-10-CM | POA: Diagnosis not present

## 2014-09-22 ENCOUNTER — Other Ambulatory Visit: Payer: Self-pay | Admitting: Physician Assistant

## 2014-09-22 DIAGNOSIS — M1612 Unilateral primary osteoarthritis, left hip: Secondary | ICD-10-CM

## 2014-09-29 ENCOUNTER — Ambulatory Visit
Admission: RE | Admit: 2014-09-29 | Discharge: 2014-09-29 | Disposition: A | Discharge status: Home / Self Care | Payer: MEDICARE | Source: Ambulatory Visit | Attending: Physician Assistant | Admitting: Physician Assistant

## 2014-09-29 DIAGNOSIS — M1612 Unilateral primary osteoarthritis, left hip: Secondary | ICD-10-CM | POA: Diagnosis not present

## 2014-09-29 MED ORDER — IOHEXOL 180 MG/ML  SOLN
1.0000 mL | Freq: Once | INTRAMUSCULAR | Status: AC | PRN
  Administered 2014-09-29: 1 mL via INTRA_ARTICULAR

## 2014-09-29 MED ORDER — METHYLPREDNISOLONE ACETATE 40 MG/ML INJ SUSP (RADIOLOG
120.0000 mg | Freq: Once | INTRAMUSCULAR | Status: AC
  Administered 2014-09-29: 120 mg via INTRA_ARTICULAR

## 2014-10-20 ENCOUNTER — Ambulatory Visit: Payer: MEDICARE | Admitting: Internal Medicine

## 2014-10-20 ENCOUNTER — Telehealth: Payer: Self-pay | Admitting: Internal Medicine

## 2014-10-20 MED ORDER — LOSARTAN POTASSIUM-HCTZ 50-12.5 MG PO TABS: 0.5000 | ORAL_TABLET | ORAL | Status: DC

## 2014-10-20 NOTE — Telephone Encounter (Signed)
She also needs levothyroxine (SYNTHROID) 125 MCG tablet [[id number]]  Sent to [facility] mail order   90 day supply on both meds

## 2014-10-20 NOTE — Telephone Encounter (Signed)
Levothyroxine was sent #[id number] on 05/08/2014.  Will send Losartan/HCTZ 50-12.5 to [facility] mail order

## 2014-10-20 NOTE — Telephone Encounter (Signed)
Pt called in and need refill on her losartan-hydrochlorothiazide (HYZAAR) 50-12.5 MG per tablet [[id number]]    [facility]

## 2014-10-20 NOTE — Telephone Encounter (Signed)
Patient called back and she was having a real hard time hearing me and she thinks that we called both meds in. I tried to tell her about the Levothyroxine. Can you please call her back to let her know that the pharmacy should already have refills for this medication. Maybe she'll be able to hear you better.

## 2014-10-20 NOTE — Telephone Encounter (Signed)
Spoke with pt. She is aware that Refills were sent in

## 2014-10-27 ENCOUNTER — Ambulatory Visit (INDEPENDENT_AMBULATORY_CARE_PROVIDER_SITE_OTHER): Payer: MEDICARE | Admitting: Neurology

## 2014-10-27 ENCOUNTER — Encounter: Payer: Self-pay | Admitting: Neurology

## 2014-10-27 VITALS — BP 116/58 | HR 67 | Ht 65.0 in | Wt 146.4 lb

## 2014-10-27 DIAGNOSIS — I6521 Occlusion and stenosis of right carotid artery: Secondary | ICD-10-CM

## 2014-10-27 DIAGNOSIS — G453 Amaurosis fugax: Secondary | ICD-10-CM | POA: Diagnosis not present

## 2014-10-27 DIAGNOSIS — E785 Hyperlipidemia, unspecified: Secondary | ICD-10-CM | POA: Diagnosis not present

## 2014-10-27 DIAGNOSIS — Z9889 Other specified postprocedural states: Secondary | ICD-10-CM

## 2014-10-27 DIAGNOSIS — I10 Essential (primary) hypertension: Secondary | ICD-10-CM | POA: Diagnosis not present

## 2014-10-27 NOTE — Progress Notes (Signed)
STROKE NEUROLOGY FOLLOW UP NOTE  NAME: Kayla Davis DOB: 03-Jun-1922  REASON FOR VISIT: stroke follow up HISTORY FROM: pt and chart  Today we had the pleasure of seeing Kayla Davis in follow-up at our Neurology Clinic. Pt was accompanied by daughter.   History Summary Kayla Davis is an 81 y.o. female with PMH of HTN, HLD, aortic valve disorder s/p pacemaker and osteoarthritis was admitted to [facility] on 01/16/14 with blurring of vision involving right side lasting about 15 minutes then resolved. She has no history of stroke or TIA. Patient was off aspirin for 2 days in anticipation of steroid injection by orthopedist on 01/17/2014. CTA showed 80% right high grade stenosis and 60% left ICA origin stenosis. CUS showed right 40-59% and left 1-39% ICA stenosis. Vascular surgery consulted and right CEA was done on 01/20/14. She recovered well from procedure. Followed up with vascular surgery and will do CUS in 6 months. She also followed up with Dr. [name] in Cardiology and pacemaker working fine.  03/31/14 follow up - the patient has been doing well. Wound healed well and no recurrent neuro symptoms. Her BP today 116/55. She still has left hip pain due to arthritis but she refused surgery and intermittently taking pain meds. She walks with walker.   Interval History During the interval time, the patient is doing well. Had follow up with Dr. [name] and her pacemaker working fine. Had follow up with Dr. Kellie Simmering and repeat CUS showed no stenosis post CEA. She is on ASA and zocor. BP today in clinic 116/58. Glucose at home around 110-120.  REVIEW OF SYSTEMS: Full 14 system review of systems performed and notable only for those listed below and in HPI above, all others are negative:  Constitutional: N/A  Cardiovascular: Leg swelling, murmur  Ear/Nose/Throat: ringing in ears, ear pain, runny nose  Skin: N/A  Eyes: N/A  Respiratory: Cough  Gastroitestinal: constipation  Genitourinary:  Incontinence of bladder, frequency of urination Hematology/Lymphatic: N/A  Endocrine: Cold intolerance  Musculoskeletal: joint pain, cramps, back pain, walking difficulty  Allergy/Immunology:   Neurological: N/A  Psychiatric: Insomnia, snoring  The following represents the patient's updated allergies and side effects list: Allergies  Allergen Reactions  . Penicillins Anaphylaxis    REACTION: swells up all over body, pt states she almost died  . Alprazolam      REACTION: eyes swell up  . Bactrim [Sulfamethoxazole-Trimethoprim]     Unable to remember the side effects  . Ceclor [Cefaclor]     Unsure of the side effects  . Cephalexin     Unsure of the side effects  . Ciprofloxacin Itching and Swelling    Pt states that she tolerated po cipro well, but not the higher dose given IV.  [name] Clindamycin/Lincomycin     Upset stomach  . Hydrocodone     Unsure of reaction to med  . Noroxin [Norfloxacin]     Unsure of side effects  . Sulfonamide Derivatives     REACTION: itching  . Tetracyclines & Related     Unsure of the reaction to this medication  . Tramadol     Did not like how she felt    The neurologically relevant items on the patient's problem list were reviewed on today's visit.  Neurologic Examination  A problem focused neurological exam (12 or more points of the single system neurologic examination, vital signs counts as 1 point, cranial nerves count for 8 points) was performed.  Blood pressure 116/58, pulse  67, height 5\' 5"  (1.651 m), weight 146 lb 6.4 oz (66.407 kg).  General - Well nourished, well developed, in no apparent distress.  Ophthalmologic - not able to see through due to eye movement.  Cardiovascular - Regular rate and rhythm with no murmur.  Mental Status -  Level of arousal and orientation to time, place, and person were intact. Language including expression, naming, repetition, comprehension was assessed and found intact.  Cranial Nerves II - XII  - II - Visual field intact OU. III, IV, VI - Extraocular movements intact. V - Facial sensation intact bilaterally. VII - Facial movement intact bilaterally. VIII - Hearing & vestibular intact bilaterally. X - Palate elevates symmetrically. XI - Chin turning & shoulder shrug intact bilaterally. XII - Tongue protrusion intact.  Motor Strength - The patient's strength was normal in all extremities except left LE proximal strength limited due to left hip pain and pronator drift was absent.  Bulk was normal and fasciculations were absent.   Motor Tone - Muscle tone was assessed at the neck and appendages and was normal.  Reflexes - The patient's reflexes were normal in all extremities and she had no pathological reflexes.  Sensory - Light touch, temperature/pinprick were assessed and were normal.    Coordination - The patient had normal movements in the hands with no ataxia or dysmetria.  Tremor was absent.  Gait and Station - walk with walker, antalgic gait on the left, slow and small stride.  Data reviewed: I personally reviewed the images and agree with the radiology interpretations.  CTA head and neck - 1. Dense bilateral carotid bifurcation calcified plaque, on the right resulting in high-grade stenosis of approximately 80% with respect to the distal vessel. 60% left ICA origin stenosis. 2. No vertebral artery stenosis. Negative for age intracranial circulation except for Mild dolichoectasia. 3. Negative CT appearance of the brain, no CT evidence of acute or subacute cortically based infarct. However, by my review of the above images, the right ICA 50% and right ICA 40% stenosis.  CUS 01/18/14 -  - Right 40% to 59% ICA stenosis. Vertebral artery flow is antegrade. - Left - 1% to 39% ICA stenosis. Vertebral artery flow is antegrade.  CUS 09/12/14 - left ICA 1-49% and right ICA s/p CEA 1-49% stenosis  2D echo - - Left ventricle: The cavity size was normal. Wall thickness  was increased in a pattern of mild LVH. The estimated ejection fraction was 60%. Wall motion was normal; there were no regional wall motion abnormalities. - Mitral valve: Calcified annulus. - Right ventricle: The cavity size was normal. Pacer wire or catheter noted in right ventricle. Systolic function was normal. - Pulmonary arteries: PA peak pressure: 33 mm Hg (S). - Impressions: No cardiac source of embolism was identified, but cannot be ruled out on the basis of this examination. Impressions: - No cardiac source of embolism was identified, but cannot be ruled out on the basis of this examination.  Component     Latest Ref Rng 01/17/2014  Cholesterol     0 - 200 mg/dL 161  Triglycerides     <150 mg/dL 56  HDL     >39 mg/dL 68  Total CHOL/HDL Ratio      2.4  VLDL     0 - 40 mg/dL 11  LDL (calc)     0 - 99 mg/dL 82  Hgb A1c MFr Bld     <5.7 % 6.3 (H)  Mean Plasma Glucose     <  117 mg/dL 134 (H)  TSH     0.350 - 4.500 uIU/mL 1.440   Component     Latest Ref Rng 07/06/2014  Hemoglobin A1C     4.6 - 6.5 % 6.7 (H)   Assessment: As you may recall, she is a 81 y.o. Caucasian female with PMH of HTN, HLD, aortic valve disorder s/p pacemaker and osteoarthritis was admitted to [facility] on 01/16/14 for amaurosis fugax on the right eye, resolved in 15 min. Subsequent CTA showed right 80% and left 60% ICA stenosis although by my read right about 50% stenosis. CUS consistent with my read. Pt had right CEA with Dr. Kellie Simmering from vascular surgery and pt doing well post op. Currently on full dose ASA and statin for stroke prevention. No complains. Repeat CUS showed no significant stenosis.  Plan:  - continue ASA and zocor for stroke prevention - check BP and glucose at home - Follow up with your primary care physician for stroke risk factor modification. Recommend maintain blood pressure goal <130/80, diabetes with hemoglobin A1c goal below 6.5% and lipids with LDL cholesterol goal  below 70 mg/dL.  - follow up with cardiology and vascular surgery - RTC PRN  No orders of the defined types were placed in this encounter.    Meds ordered this encounter  Medications  . aspirin 81 MG tablet    Sig: Take 81 mg by mouth daily.  [name] (SYSTANE) 0.4-0.3 % SOLN    Sig: Apply to eye.  . fluticasone (FLONASE) 50 MCG/ACT nasal spray    Sig: Place 1 spray into both nostrils daily.    Patient Instructions  - continue ASA and zocor for stroke prevention - check BP and glucose at home - Follow up with your primary care physician for stroke risk factor modification. Recommend maintain blood pressure goal <130/80, diabetes with hemoglobin A1c goal below 6.5% and lipids with LDL cholesterol goal below 70 mg/dL.  - follow up with cardiology and vascular surgery - follow up as needed    Rosalin Hawking, MD PhD [facility] [address], [place], [place] [address] [phone]

## 2014-10-27 NOTE — Patient Instructions (Signed)
-   continue ASA and zocor for stroke prevention - check BP and glucose at home - Follow up with your primary care physician for stroke risk factor modification. Recommend maintain blood pressure goal <130/80, diabetes with hemoglobin A1c goal below 6.5% and lipids with LDL cholesterol goal below 70 mg/dL.  - follow up with cardiology and vascular surgery - follow up as needed

## 2014-11-17 ENCOUNTER — Ambulatory Visit: Payer: MEDICARE | Admitting: Internal Medicine

## 2014-11-20 DIAGNOSIS — H43813 Vitreous degeneration, bilateral: Secondary | ICD-10-CM | POA: Diagnosis not present

## 2014-11-20 DIAGNOSIS — Z961 Presence of intraocular lens: Secondary | ICD-10-CM | POA: Diagnosis not present

## 2014-11-20 DIAGNOSIS — E119 Type 2 diabetes mellitus without complications: Secondary | ICD-10-CM | POA: Diagnosis not present

## 2014-11-20 DIAGNOSIS — H26492 Other secondary cataract, left eye: Secondary | ICD-10-CM | POA: Diagnosis not present

## 2014-11-20 LAB — HM DIABETES EYE EXAM

## 2014-11-22 ENCOUNTER — Other Ambulatory Visit (INDEPENDENT_AMBULATORY_CARE_PROVIDER_SITE_OTHER): Payer: MEDICARE

## 2014-11-22 ENCOUNTER — Ambulatory Visit (INDEPENDENT_AMBULATORY_CARE_PROVIDER_SITE_OTHER): Payer: MEDICARE | Admitting: Internal Medicine

## 2014-11-22 ENCOUNTER — Encounter: Payer: Self-pay | Admitting: Internal Medicine

## 2014-11-22 VITALS — BP 138/68 | HR 68 | Wt 148.0 lb

## 2014-11-22 DIAGNOSIS — E1129 Type 2 diabetes mellitus with other diabetic kidney complication: Secondary | ICD-10-CM

## 2014-11-22 DIAGNOSIS — I6521 Occlusion and stenosis of right carotid artery: Secondary | ICD-10-CM | POA: Diagnosis not present

## 2014-11-22 DIAGNOSIS — J310 Chronic rhinitis: Secondary | ICD-10-CM | POA: Diagnosis not present

## 2014-11-22 DIAGNOSIS — N390 Urinary tract infection, site not specified: Secondary | ICD-10-CM

## 2014-11-22 DIAGNOSIS — R32 Unspecified urinary incontinence: Secondary | ICD-10-CM | POA: Diagnosis not present

## 2014-11-22 DIAGNOSIS — IMO0002 Reserved for concepts with insufficient information to code with codable children: Secondary | ICD-10-CM

## 2014-11-22 DIAGNOSIS — E1165 Type 2 diabetes mellitus with hyperglycemia: Secondary | ICD-10-CM

## 2014-11-22 LAB — BASIC METABOLIC PANEL
BUN: 16 mg/dL (ref 6–23)
CO2: 29 mEq/L (ref 19–32)
Calcium: 9.2 mg/dL (ref 8.4–10.5)
Chloride: 101 mEq/L (ref 96–112)
Creatinine, Ser: 0.83 mg/dL (ref 0.40–1.20)
GFR: 68.65 mL/min (ref >60.00)
Glucose, Bld: 121 mg/dL — ABNORMAL HIGH (ref 70–99)
Potassium: 4.1 mEq/L (ref 3.5–5.1)
Sodium: 136 mEq/L (ref 135–145)

## 2014-11-22 LAB — URINALYSIS, ROUTINE W REFLEX MICROSCOPIC
Bilirubin Urine: NEGATIVE
KETONES UR: NEGATIVE
NITRITE: POSITIVE — AB
PH: 6 (ref 5.0–8.0)
SPECIFIC GRAVITY, URINE: 1.015 (ref 1.000–1.030)
Total Protein, Urine: NEGATIVE
Urine Glucose: NEGATIVE
Urobilinogen, UA: 0.2 (ref 0.0–1.0)

## 2014-11-22 LAB — HEMOGLOBIN A1C: Hgb A1c MFr Bld: 6.4 % (ref 4.6–6.5)

## 2014-11-22 MED ORDER — IPRATROPIUM BROMIDE 0.06 % NA SOLN: 2.0000 | Freq: Three times a day (TID) | NASAL | Status: DC

## 2014-11-22 MED ORDER — GLUCOSE BLOOD VI STRP: 1.0000 | ORAL_STRIP | Freq: Two times a day (BID) | Status: DC

## 2014-11-22 NOTE — Assessment & Plan Note (Signed)
UA and Cx 

## 2014-11-22 NOTE — Progress Notes (Signed)
Subjective:  Patient ID: Kayla Davis, female    DOB: July 21, 1922  Age: 81 y.o. MRN: [id number]  CC: No chief complaint on file.   HPI Kayla Davis presents for UTI, DM, HTN f/u. C/o nose dripping water all the time...  Outpatient Prescriptions Prior to Visit  Medication Sig Dispense Refill  . aspirin 81 MG tablet Take 81 mg by mouth daily.    . cholecalciferol (VITAMIN D) 1000 UNITS tablet Take 1,000 Units by mouth daily.      . Cyanocobalamin (B-12) 2500 MCG TABS Take 2,500 mcg by mouth daily.     . Lancets (ONETOUCH ULTRASOFT) lancets Use as instructed 100 each 12  . levothyroxine (SYNTHROID) 125 MCG tablet Take 1 tablet (125 mcg total) by mouth daily before breakfast. 90 tablet 3  . losartan-hydrochlorothiazide (HYZAAR) 50-12.5 MG per tablet Take 0.5 tablets by mouth every morning. 45 tablet 3  . Misc Natural Products (OSTEO BI-FLEX ADV DOUBLE ST) CAPS Take 1 capsule by mouth daily.    [name] OVER THE COUNTER MEDICATION Take 1 tablet by mouth daily. Flex-aid    . Polyethyl [name] (SYSTANE) 0.4-0.3 % SOLN Apply to eye.    . polyethylene glycol (MIRALAX / GLYCOLAX) packet Take 17 g by mouth daily as needed for moderate constipation.     . simvastatin (ZOCOR) 40 MG tablet Take 1 tablet (40 mg total) by mouth daily. 90 tablet 3  . sodium chloride (OCEAN) 0.65 % SOLN nasal spray Place 1 spray into both nostrils 2 (two) times daily as needed for congestion.    . fluticasone (FLONASE) 50 MCG/ACT nasal spray Place 1 spray into both nostrils daily.    [name] glucose blood (ONE TOUCH TEST STRIPS) test strip Use as instructed 50 each 12  . nitrofurantoin, macrocrystal-monohydrate, (MACROBID) 100 MG capsule Take 1 capsule (100 mg total) by mouth 2 (two) times daily. (Patient taking differently: Take 100 mg by mouth as needed. ) 10 capsule 0  . acetaminophen (TYLENOL) 500 MG tablet Take 500 mg by mouth daily as needed for moderate pain (pain).     [name] loratadine (CLARITIN) 10 MG tablet  Take 10 mg by mouth every other day.    . Multiple Vitamins-Minerals (PRESERVISION AREDS PO) Take 1 tablet by mouth daily.    . pregabalin (LYRICA) 75 MG capsule Take 1 capsule (75 mg total) by mouth at bedtime. 90 capsule 3   No facility-administered medications prior to visit.    ROS Review of Systems  Constitutional: Negative for chills, activity change, appetite change, fatigue and unexpected weight change.  HENT: Positive for congestion. Negative for ear discharge, mouth sores, nosebleeds and sinus pressure.   Eyes: Negative for visual disturbance.  Respiratory: Negative for cough and chest tightness.   Gastrointestinal: Negative for nausea, vomiting and abdominal pain.  Genitourinary: Positive for urgency and frequency. Negative for difficulty urinating and vaginal pain.  Musculoskeletal: Negative for back pain, gait problem and neck pain.  Skin: Negative for pallor and rash.  Neurological: Negative for dizziness, tremors, weakness, numbness and headaches.  Psychiatric/Behavioral: Negative for suicidal ideas, confusion and sleep disturbance.    Objective:  BP 138/68 mmHg  Pulse 68  Wt 148 lb (67.132 kg)  SpO2 98%  BP Readings from Last 3 Encounters:  11/22/14 138/68  10/27/14 116/58  09/12/14 144/73    Wt Readings from Last 3 Encounters:  11/22/14 148 lb (67.132 kg)  10/27/14 146 lb 6.4 oz (66.407 kg)  09/12/14 150 lb (68.04 kg)  Physical Exam  Constitutional: She appears well-developed. No distress.  HENT:  Head: Normocephalic.  Right Ear: External ear normal.  Left Ear: External ear normal.  Nose: Nose normal.  Mouth/Throat: Oropharynx is clear and moist.  Eyes: Conjunctivae are normal. Pupils are equal, round, and reactive to light. Right eye exhibits no discharge. Left eye exhibits no discharge.  Neck: Normal range of motion. Neck supple. No JVD present. No tracheal deviation present. No thyromegaly present.  Cardiovascular: Normal rate, regular rhythm and  normal heart sounds.   Pulmonary/Chest: No stridor. No respiratory distress. She has no wheezes.  Abdominal: Soft. Bowel sounds are normal. She exhibits no distension and no mass. There is no tenderness. There is no rebound and no guarding.  Musculoskeletal: She exhibits no edema or tenderness.  Lymphadenopathy:    She has no cervical adenopathy.  Neurological: She displays normal reflexes. No cranial nerve deficit. She exhibits normal muscle tone. Coordination abnormal.  Skin: No rash noted. No erythema.  Psychiatric: She has a normal mood and affect. Her behavior is normal. Judgment and thought content normal.  Stooped over her walker when walking Watery nasal d/c  Lab Results  Component Value Date   WBC 5.6 06/28/2014   HGB 11.9* 06/28/2014   HCT 36.6 06/28/2014   PLT 158 06/28/2014   GLUCOSE 139* 06/28/2014   CHOL 161 01/17/2014   TRIG 56 01/17/2014   HDL 68 01/17/2014   LDLDIRECT 143.0 10/07/2007   LDLCALC 82 01/17/2014   ALT 19 04/21/2014   AST 24 04/21/2014   NA 136 06/28/2014   K 3.8 06/28/2014   CL 105 06/28/2014   CREATININE 0.73 06/28/2014   BUN 28* 06/28/2014   CO2 25 06/28/2014   TSH 0.70 04/21/2014   INR 1.04 02/11/2010   HGBA1C 6.7* 07/06/2014    Dg Fluoro Guide Ndl Plcd/bx/inj/loc  09/29/2014   CLINICAL DATA:  Left hip osteoarthritis.  EXAM: LEFT HIP INJECTION UNDER FLUOROSCOPY  FLUOROSCOPY TIME:  Radiation Exposure Index (as provided by the fluoroscopic device): 26.96 microGray*m2  PROCEDURE: Overlying skin prepped with Betadine, draped in the usual sterile fashion, and infiltrated locally with buffered Lidocaine. A 22 gauge spinal needle was advanced to the lateral third of the left femoral head- neck junction. 1 ml of Lidocaine injected easily. Diagnostic injection of iodinated contrast demonstrates intra-articular spread without intravascular component.  120 mg Depo-Medrol and 2 mL of 0.5% bupivacaine were then administered. No immediate complication.   IMPRESSION: Technically successful left hip injection under fluoroscopy.   Electronically Signed   By: Logan Bores   On: 09/29/2014 14:15    Assessment & Plan:   Diagnoses and all orders for this visit:  Urinary incontinence in female -     Basic metabolic panel; Future -     Hemoglobin A1c; Future -     Urinalysis; Future -     CULTURE, URINE COMPREHENSIVE; Future  Urinary tract infection without hematuria, site unspecified -     Basic metabolic panel; Future -     Hemoglobin A1c; Future -     Urinalysis; Future -     CULTURE, URINE COMPREHENSIVE; Future  Rhinitis, atrophic -     Basic metabolic panel; Future -     Hemoglobin A1c; Future -     Urinalysis; Future -     CULTURE, URINE COMPREHENSIVE; Future  Uncontrolled type 2 diabetes with renal manifestation -     Basic metabolic panel; Future -     Hemoglobin A1c; Future -  Urinalysis; Future -     CULTURE, URINE COMPREHENSIVE; Future  Other orders -     glucose blood (ONE TOUCH TEST STRIPS) test strip; 1 each by Other route 2 (two) times daily. Use as instructed -     ipratropium (ATROVENT) 0.06 % nasal spray; Place 2 sprays into the nose 3 (three) times daily.  I have discontinued Ms. Mazurek's nitrofurantoin (macrocrystal-monohydrate) and fluticasone. I have also changed her glucose blood. Additionally, I am having her start on ipratropium. Lastly, I am having her maintain her B-12, cholecalciferol, polyethylene glycol, pregabalin, simvastatin, sodium chloride, acetaminophen, onetouch ultrasoft, levothyroxine, OSTEO BI-FLEX ADV DOUBLE ST, OVER THE COUNTER MEDICATION, Multiple Vitamins-Minerals (PRESERVISION AREDS PO), loratadine, losartan-hydrochlorothiazide, aspirin, and Polyethyl [name].  Meds ordered this encounter  Medications  . glucose blood (ONE TOUCH TEST STRIPS) test strip    Sig: 1 each by Other route 2 (two) times daily. Use as instructed    Dispense:  100 each    Refill:  12    Dx: R73.09   . ipratropium (ATROVENT) 0.06 % nasal spray    Sig: Place 2 sprays into the nose 3 (three) times daily.    Dispense:  45 mL    Refill:  2     Follow-up: Return in about 4 months (around 03/24/2015) for a follow-up visit.  [name], MD

## 2014-11-22 NOTE — Progress Notes (Signed)
Pre visit review using our clinic review tool, if applicable. No additional management support is needed unless otherwise documented below in the visit note. 

## 2014-11-22 NOTE — Assessment & Plan Note (Signed)
UA

## 2014-11-22 NOTE — Assessment & Plan Note (Signed)
Chronic D/c Claritin, Flonase - no help; start Atrovent

## 2014-11-22 NOTE — Assessment & Plan Note (Signed)
Labs

## 2014-11-25 LAB — CULTURE, URINE COMPREHENSIVE: Colony Count: 70000

## 2014-11-28 ENCOUNTER — Telehealth: Payer: Self-pay | Admitting: Internal Medicine

## 2014-11-28 NOTE — Telephone Encounter (Signed)
Pt called and states [facility] has been trying to get a hold of [place] regarding a prescription.  They didn't tell her which one. Can you please call [facility]

## 2014-12-01 DIAGNOSIS — M1612 Unilateral primary osteoarthritis, left hip: Secondary | ICD-10-CM | POA: Diagnosis not present

## 2014-12-01 NOTE — Telephone Encounter (Signed)
I called [facility] and spoke to a Rep who was not clear about what is needed.  [phone].  I called pt to get clarification. She states she is needing her test strips. I advised her that we sent a new Rx on 11/22/14.

## 2014-12-04 ENCOUNTER — Ambulatory Visit (INDEPENDENT_AMBULATORY_CARE_PROVIDER_SITE_OTHER): Payer: MEDICARE | Admitting: *Deleted

## 2014-12-04 DIAGNOSIS — I495 Sick sinus syndrome: Secondary | ICD-10-CM

## 2014-12-05 NOTE — Progress Notes (Signed)
Remote pacemaker transmission.   

## 2014-12-08 LAB — CUP PACEART REMOTE DEVICE CHECK
Battery Impedance: 276 Ohm
Battery Remaining Longevity: 119 mo
Brady Statistic AP VS Percent: 31 %
Brady Statistic AS VP Percent: 0 %
Brady Statistic AS VS Percent: 69 %
Date Time Interrogation Session: 20160822092938
Lead Channel Impedance Value: 470 Ohm
Lead Channel Pacing Threshold Amplitude: 0.5 V
Lead Channel Pacing Threshold Amplitude: 0.75 V
Lead Channel Pacing Threshold Pulse Width: 0.4 ms
Lead Channel Pacing Threshold Pulse Width: 0.4 ms
Lead Channel Sensing Intrinsic Amplitude: 11.2 mV
Lead Channel Setting Pacing Amplitude: 2.5 V
Lead Channel Setting Pacing Pulse Width: 0.4 ms
Lead Channel Setting Sensing Sensitivity: 4 mV
MDC IDC MSMT BATTERY VOLTAGE: 2.78 V
MDC IDC MSMT LEADCHNL RA IMPEDANCE VALUE: 453 Ohm
MDC IDC MSMT LEADCHNL RA SENSING INTR AMPL: 1.4 mV
MDC IDC SET LEADCHNL RA PACING AMPLITUDE: 2 V
MDC IDC STAT BRADY AP VP PERCENT: 0 %

## 2014-12-14 ENCOUNTER — Encounter: Payer: Self-pay | Admitting: Cardiology

## 2014-12-26 ENCOUNTER — Encounter: Payer: Self-pay | Admitting: Internal Medicine

## 2015-01-12 ENCOUNTER — Telehealth: Payer: Self-pay | Admitting: *Deleted

## 2015-01-12 NOTE — Telephone Encounter (Signed)
Pt is scheduled for  LEFT TOTAL HIP ARTHROPLASTY ANTERIOR APPROACH 03/23/15 with Dr. Wynelle Link. I have surgical clearance form at my desk to be completed by Dr. Alain Marion. Pt is scheduled to see PCP 02/23/15 @3  pm. I called pt to advise she must keep this appt with PCP so he can clear her for surgery. Pt voiced understanding and agrees. We will fax form after her 02/23/15 OV and PCP signs/clears pt.

## 2015-01-15 ENCOUNTER — Telehealth: Payer: Self-pay | Admitting: Internal Medicine

## 2015-01-15 MED ORDER — SIMVASTATIN 40 MG PO TABS: 40.0000 mg | ORAL_TABLET | Freq: Every day | ORAL | Status: DC

## 2015-01-15 NOTE — Telephone Encounter (Signed)
Patient requesting refill for simvastatin (ZOCOR) 40 MG tablet [[id number] Pharmacy is [facility] mail order

## 2015-01-15 NOTE — Telephone Encounter (Signed)
Refill sent 90 to [facility]...Johny Chess

## 2015-01-15 NOTE — Telephone Encounter (Signed)
Pt request 90 days supply

## 2015-01-16 ENCOUNTER — Other Ambulatory Visit: Payer: Self-pay | Admitting: *Deleted

## 2015-01-16 DIAGNOSIS — L820 Inflamed seborrheic keratosis: Secondary | ICD-10-CM | POA: Diagnosis not present

## 2015-01-16 DIAGNOSIS — L570 Actinic keratosis: Secondary | ICD-10-CM | POA: Diagnosis not present

## 2015-01-16 DIAGNOSIS — D485 Neoplasm of uncertain behavior of skin: Secondary | ICD-10-CM | POA: Diagnosis not present

## 2015-01-16 MED ORDER — SIMVASTATIN 40 MG PO TABS: 40.0000 mg | ORAL_TABLET | Freq: Every day | ORAL | Status: DC

## 2015-01-22 ENCOUNTER — Telehealth: Payer: Self-pay | Admitting: Internal Medicine

## 2015-01-22 ENCOUNTER — Other Ambulatory Visit (INDEPENDENT_AMBULATORY_CARE_PROVIDER_SITE_OTHER): Payer: MEDICARE

## 2015-01-22 DIAGNOSIS — R8290 Unspecified abnormal findings in urine: Secondary | ICD-10-CM

## 2015-01-22 DIAGNOSIS — R300 Dysuria: Secondary | ICD-10-CM | POA: Diagnosis not present

## 2015-01-22 DIAGNOSIS — I6521 Occlusion and stenosis of right carotid artery: Secondary | ICD-10-CM

## 2015-01-22 LAB — URINALYSIS, ROUTINE W REFLEX MICROSCOPIC
Bilirubin Urine: NEGATIVE
Ketones, ur: NEGATIVE
Nitrite: POSITIVE — AB
SPECIFIC GRAVITY, URINE: 1.02 (ref 1.000–1.030)
TOTAL PROTEIN, URINE-UPE24: NEGATIVE
URINE GLUCOSE: NEGATIVE
UROBILINOGEN UA: 0.2 (ref 0.0–1.0)
pH: 6 (ref 5.0–8.0)

## 2015-01-22 NOTE — Telephone Encounter (Signed)
UA order placed. OV scheduled for 01/23/15. Pt informed

## 2015-01-22 NOTE — Telephone Encounter (Signed)
Patient called to advise that she suspects she has a UTI, no access until tomorrow. She is requesting that lab orders for Ua be placed for today. Please contact patient to advise.

## 2015-01-23 ENCOUNTER — Ambulatory Visit (INDEPENDENT_AMBULATORY_CARE_PROVIDER_SITE_OTHER): Payer: MEDICARE | Admitting: Internal Medicine

## 2015-01-23 ENCOUNTER — Ambulatory Visit (INDEPENDENT_AMBULATORY_CARE_PROVIDER_SITE_OTHER)
Admission: RE | Admit: 2015-01-23 | Discharge: 2015-01-23 | Disposition: A | Discharge status: Home / Self Care | Payer: MEDICARE | Source: Ambulatory Visit | Attending: Internal Medicine | Admitting: Internal Medicine

## 2015-01-23 ENCOUNTER — Encounter: Payer: Self-pay | Admitting: Internal Medicine

## 2015-01-23 VITALS — BP 138/80 | HR 69 | Temp 97.5°F | Wt 147.0 lb

## 2015-01-23 DIAGNOSIS — I6521 Occlusion and stenosis of right carotid artery: Secondary | ICD-10-CM

## 2015-01-23 DIAGNOSIS — Z23 Encounter for immunization: Secondary | ICD-10-CM

## 2015-01-23 DIAGNOSIS — M545 Low back pain: Secondary | ICD-10-CM

## 2015-01-23 DIAGNOSIS — M5136 Other intervertebral disc degeneration, lumbar region: Secondary | ICD-10-CM | POA: Diagnosis not present

## 2015-01-23 MED ORDER — MELOXICAM 7.5 MG PO TABS: 7.5000 mg | ORAL_TABLET | Freq: Every day | ORAL | Status: DC | PRN

## 2015-01-23 NOTE — Progress Notes (Signed)
Pre visit review using our clinic review tool, if applicable. No additional management support is needed unless otherwise documented below in the visit note. 

## 2015-01-23 NOTE — Assessment & Plan Note (Signed)
[facility] today X ray LS spine

## 2015-01-23 NOTE — Patient Instructions (Signed)

## 2015-01-24 NOTE — Progress Notes (Signed)
Subjective:  Patient ID: Kayla Davis, female    DOB: April 22, 1922  Age: 81 y.o. MRN: [id number]  CC: No chief complaint on file.   HPI Kayla Davis presents for LBP x 5d, no irrad; better w/rest  Outpatient Prescriptions Prior to Visit  Medication Sig Dispense Refill  . aspirin 81 MG tablet Take 81 mg by mouth daily.    . cholecalciferol (VITAMIN D) 1000 UNITS tablet Take 1,000 Units by mouth daily.      . Cyanocobalamin (B-12) 2500 MCG TABS Take 2,500 mcg by mouth daily.     [name] glucose blood (ONE TOUCH TEST STRIPS) test strip 1 each by Other route 2 (two) times daily. Use as instructed 100 each 12  . ipratropium (ATROVENT) 0.06 % nasal spray Place 2 sprays into the nose 3 (three) times daily. 45 mL 2  . Lancets (ONETOUCH ULTRASOFT) lancets Use as instructed 100 each 12  . levothyroxine (SYNTHROID) 125 MCG tablet Take 1 tablet (125 mcg total) by mouth daily before breakfast. 90 tablet 3  . loratadine (CLARITIN) 10 MG tablet Take 10 mg by mouth every other day.    . losartan-hydrochlorothiazide (HYZAAR) 50-12.5 MG per tablet Take 0.5 tablets by mouth every morning. 45 tablet 3  . Misc Natural Products (OSTEO BI-FLEX ADV DOUBLE ST) CAPS Take 1 capsule by mouth daily.    . Multiple Vitamins-Minerals (PRESERVISION AREDS PO) Take 1 tablet by mouth daily.    [name] OVER THE COUNTER MEDICATION Take 1 tablet by mouth daily. Flex-aid    . Polyethyl [name] (SYSTANE) 0.4-0.3 % SOLN Apply to eye.    . simvastatin (ZOCOR) 40 MG tablet Take 1 tablet (40 mg total) by mouth daily. 90 tablet 0  . sodium chloride (OCEAN) 0.65 % SOLN nasal spray Place 1 spray into both nostrils 2 (two) times daily as needed for congestion.    [name] acetaminophen (TYLENOL) 500 MG tablet Take 500 mg by mouth daily as needed for moderate pain (pain).     . polyethylene glycol (MIRALAX / GLYCOLAX) packet Take 17 g by mouth daily as needed for moderate constipation.     . pregabalin (LYRICA) 75 MG capsule Take 1  capsule (75 mg total) by mouth at bedtime. 90 capsule 3   No facility-administered medications prior to visit.    ROS Review of Systems  Constitutional: Positive for fatigue. Negative for chills, activity change, appetite change and unexpected weight change.  HENT: Negative for congestion, mouth sores and sinus pressure.   Eyes: Negative for visual disturbance.  Respiratory: Negative for cough and chest tightness.   Gastrointestinal: Negative for nausea and abdominal pain.  Genitourinary: Negative for frequency, difficulty urinating and vaginal pain.  Musculoskeletal: Positive for back pain, arthralgias and gait problem.  Skin: Negative for pallor and rash.  Neurological: Negative for dizziness, tremors, weakness, numbness and headaches.  Psychiatric/Behavioral: Negative for confusion and sleep disturbance.    Objective:  BP 138/80 mmHg  Pulse 69  Temp(Src) 97.5 F (36.4 C) (Oral)  Wt 147 lb (66.679 kg)  SpO2 98%  BP Readings from Last 3 Encounters:  01/23/15 138/80  11/22/14 138/68  10/27/14 116/58    Wt Readings from Last 3 Encounters:  01/23/15 147 lb (66.679 kg)  11/22/14 148 lb (67.132 kg)  10/27/14 146 lb 6.4 oz (66.407 kg)    Physical Exam  Constitutional: She appears well-developed. No distress.  HENT:  Head: Normocephalic.  Right Ear: External ear normal.  Left Ear: External ear normal.  Nose: Nose normal.  Mouth/Throat: Oropharynx is clear and moist.  Eyes: Conjunctivae are normal. Pupils are equal, round, and reactive to light. Right eye exhibits no discharge. Left eye exhibits no discharge.  Neck: Normal range of motion. Neck supple. No JVD present. No tracheal deviation present. No thyromegaly present.  Cardiovascular: Normal rate, regular rhythm and normal heart sounds.   Pulmonary/Chest: No stridor. No respiratory distress. She has no wheezes.  Abdominal: Soft. Bowel sounds are normal. She exhibits no distension and no mass. There is no tenderness.  There is no rebound and no guarding.  Musculoskeletal: She exhibits tenderness. She exhibits no edema.  Lymphadenopathy:    She has no cervical adenopathy.  Neurological: She displays normal reflexes. No cranial nerve deficit. She exhibits normal muscle tone. Coordination abnormal.  Skin: No rash noted. No erythema.  Psychiatric: She has a normal mood and affect. Her behavior is normal. Judgment and thought content normal.  LS tender Using a walker  Lab Results  Component Value Date   WBC 5.6 06/28/2014   HGB 11.9* 06/28/2014   HCT 36.6 06/28/2014   PLT 158 06/28/2014   GLUCOSE 121* 11/22/2014   CHOL 161 01/17/2014   TRIG 56 01/17/2014   HDL 68 01/17/2014   LDLDIRECT 143.0 10/07/2007   LDLCALC 82 01/17/2014   ALT 19 04/21/2014   AST 24 04/21/2014   NA 136 11/22/2014   K 4.1 11/22/2014   CL 101 11/22/2014   CREATININE 0.83 11/22/2014   BUN 16 11/22/2014   CO2 29 11/22/2014   TSH 0.70 04/21/2014   INR 1.04 02/11/2010   HGBA1C 6.4 11/22/2014    Dg Lumbar Spine 2-3 Views  01/23/2015  CLINICAL DATA:  Severe low back pain for 5 days.  No known injury. EXAM: LUMBAR SPINE - 2-3 VIEW COMPARISON:  04/29/2013 FINDINGS: Mild rightward scoliosis. Severe diffuse degenerative disc and facet disease throughout the lumbar spine. 16 mm of anterolisthesis of L5 on S1, increased since prior study. Severe degenerative facet disease throughout the lumbar spine, most pronounced from L3-4 through L5-S1. No acute fracture. IMPRESSION: Severe diffuse degenerative disc and facet disease throughout the lumbar spine. Increasing anterolisthesis of L5 on S1, now 16 mm. No acute findings. Electronically Signed   By: Rolm Baptise M.D.   On: 01/23/2015 16:36    Assessment & Plan:   Diagnoses and all orders for this visit:  Low back pain without sciatica, unspecified back pain laterality -     DG Lumbar Spine 2-3 Views  Need for influenza vaccination -     Flu Vaccine QUAD 36+ mos IM  Other orders -      meloxicam (MOBIC) 7.5 MG tablet; Take 1-2 tablets (7.5-15 mg total) by mouth daily as needed for pain.   I am having [name] start on meloxicam. I am also having her maintain her B-12, cholecalciferol, polyethylene glycol, pregabalin, sodium chloride, acetaminophen, onetouch ultrasoft, levothyroxine, OSTEO BI-FLEX ADV DOUBLE ST, OVER THE COUNTER MEDICATION, Multiple Vitamins-Minerals (PRESERVISION AREDS PO), loratadine, losartan-hydrochlorothiazide, aspirin, Polyethyl [name], glucose blood, ipratropium, and simvastatin.  Meds ordered this encounter  Medications  . meloxicam (MOBIC) 7.5 MG tablet    Sig: Take 1-2 tablets (7.5-15 mg total) by mouth daily as needed for pain.    Dispense:  60 tablet    Refill:  1     Follow-up: No Follow-up on file.  [name], MD

## 2015-01-30 ENCOUNTER — Telehealth: Payer: Self-pay | Admitting: Internal Medicine

## 2015-01-30 NOTE — Telephone Encounter (Signed)
Patient advised of dr plotnikovs note, patient will continue with plan as discussed at office visit---also patient will be discussing clearance for surgery in December, when patient comes for office visit on nov 11th at 3pm

## 2015-01-30 NOTE — Telephone Encounter (Signed)
Pt request x-ray result. Please call her back

## 2015-02-02 ENCOUNTER — Encounter: Payer: Self-pay | Admitting: Internal Medicine

## 2015-02-05 ENCOUNTER — Ambulatory Visit: Payer: MEDICARE | Admitting: Internal Medicine

## 2015-02-07 ENCOUNTER — Encounter: Payer: Self-pay | Admitting: Internal Medicine

## 2015-02-07 ENCOUNTER — Ambulatory Visit (INDEPENDENT_AMBULATORY_CARE_PROVIDER_SITE_OTHER): Payer: MEDICARE | Admitting: Internal Medicine

## 2015-02-07 VITALS — BP 136/68 | HR 70 | Ht 65.0 in | Wt 150.8 lb

## 2015-02-07 DIAGNOSIS — I495 Sick sinus syndrome: Secondary | ICD-10-CM | POA: Diagnosis not present

## 2015-02-07 DIAGNOSIS — R0602 Shortness of breath: Secondary | ICD-10-CM | POA: Diagnosis not present

## 2015-02-07 DIAGNOSIS — I10 Essential (primary) hypertension: Secondary | ICD-10-CM

## 2015-02-07 DIAGNOSIS — Z01818 Encounter for other preprocedural examination: Secondary | ICD-10-CM | POA: Diagnosis not present

## 2015-02-07 DIAGNOSIS — I6521 Occlusion and stenosis of right carotid artery: Secondary | ICD-10-CM

## 2015-02-07 LAB — CUP PACEART INCLINIC DEVICE CHECK
Battery Remaining Longevity: 120 mo
Brady Statistic AP VP Percent: 0 %
Brady Statistic AS VP Percent: 0 %
Brady Statistic AS VS Percent: 73 %
Date Time Interrogation Session: 20161026180632
Implantable Lead Implant Date: 20111031
Implantable Lead Location: 753859
Implantable Lead Model: 4470
Implantable Lead Serial Number: 686076
Lead Channel Impedance Value: 479 Ohm
Lead Channel Impedance Value: 508 Ohm
Lead Channel Pacing Threshold Amplitude: 0.5 V
Lead Channel Pacing Threshold Amplitude: 0.75 V
Lead Channel Pacing Threshold Pulse Width: 0.4 ms
Lead Channel Pacing Threshold Pulse Width: 0.4 ms
Lead Channel Setting Pacing Amplitude: 2.5 V
Lead Channel Setting Pacing Pulse Width: 0.4 ms
MDC IDC LEAD IMPLANT DT: 20111031
MDC IDC LEAD LOCATION: 753860
MDC IDC LEAD SERIAL: 548557
MDC IDC MSMT BATTERY IMPEDANCE: 276 Ohm
MDC IDC MSMT BATTERY VOLTAGE: 2.78 V
MDC IDC MSMT LEADCHNL RA PACING THRESHOLD AMPLITUDE: 0.5 V
MDC IDC MSMT LEADCHNL RA PACING THRESHOLD PULSEWIDTH: 0.4 ms
MDC IDC MSMT LEADCHNL RA SENSING INTR AMPL: 2 mV
MDC IDC MSMT LEADCHNL RV PACING THRESHOLD AMPLITUDE: 0.75 V
MDC IDC MSMT LEADCHNL RV PACING THRESHOLD PULSEWIDTH: 0.4 ms
MDC IDC MSMT LEADCHNL RV SENSING INTR AMPL: 15.67 mV
MDC IDC SET LEADCHNL RA PACING AMPLITUDE: 2 V
MDC IDC SET LEADCHNL RV SENSING SENSITIVITY: 5.6 mV
MDC IDC STAT BRADY AP VS PERCENT: 27 %

## 2015-02-07 NOTE — Patient Instructions (Addendum)
Medication Instructions:  Your physician recommends that you continue on your current medications as directed. Please refer to the Current Medication list given to you today.   Labwork: None ordered   Testing/Procedures: Your physician has requested that you have an echocardiogram. Echocardiography is a painless test that uses sound waves to create images of your heart. It provides your doctor with information about the size and shape of your heart and how well your heart's chambers and valves are working. This procedure takes approximately one hour. There are no restrictions for this procedure.   Your physician has requested that you have a lexiscan myoview. For further information please visit [email or web address]. Please follow instruction sheet, as given.    Follow-Up: Your physician wants you to follow-up in: 12 months with Chanetta Marshall, NP You will receive a reminder letter in the mail two months in advance. If you don't receive a letter, please call our office to schedule the follow-up appointment.  Remote monitoring is used to monitor your Pacemaker  from home. This monitoring reduces the number of office visits required to check your device to one time per year. It allows [place] to keep an eye on the functioning of your device to ensure it is working properly. You are scheduled for a device check from home on 05/09/15. You may send your transmission at any time that day. If you have a wireless device, the transmission will be sent automatically. After your physician reviews your transmission, you will receive a postcard with your next transmission date.       Any Other Special Instructions Will Be Listed Below (If Applicable).     If you need a refill on your cardiac medications before your next appointment, please call your pharmacy.

## 2015-02-13 DIAGNOSIS — R0602 Shortness of breath: Secondary | ICD-10-CM | POA: Insufficient documentation

## 2015-02-13 NOTE — Progress Notes (Signed)
PCP: [name], MD  Kayla Davis is a 81 y.o. female who presents today for routine electrophysiology followup.  Since last being seen in our clinic, the patient has developed slightly worsening SOB and chest discomfort. Today, she denies symptoms of palpitations,  dizziness, presyncope, or syncope.  She has chronic pain and swelling in her L leg.  The patient is otherwise without complaint today.   Past Medical History  Diagnosis Date  . Hypertension   . Hyperlipidemia   . Aortic valve disorders   . Unspecified hereditary and idiopathic peripheral neuropathy   . Other abnormal glucose   . Unspecified hearing loss   . Unspecified chronic bronchitis ([place])   . Unspecified hypothyroidism   . Unspecified gastritis and gastroduodenitis without mention of hemorrhage   . Diverticulosis of colon (without mention of hemorrhage)   . Irritable bowel syndrome   . Osteoarthrosis, unspecified whether generalized or localized, unspecified site   . Backache, unspecified   . Pain in limb   . Osteoporosis, unspecified   . Herpes zoster with other nervous system complications(053.19)   . Anxiety state, unspecified   . Anemia, unspecified   . Bradycardia     s/p PPM   Past Surgical History  Procedure Laterality Date  . Pacemaker insertion      MDT  . Appendectomy    . Cholecystectomy    . Tonsillectomy    . Vaginal hysterectomy    . Cataract extraction, bilateral  end of June 2013  . Cataract extraction, bilateral  beginning of July 2013  . Endarterectomy Right 01/20/2014    Procedure: RIGHT CAROTID ARTERY ENDARTERECTOMY WITH DACRON PATCH ANGIOPLASTY;  Surgeon: Mal Misty, MD;  Location: [facility];  Service: Vascular;  Laterality: Right;    Current Outpatient Prescriptions  Medication Sig Dispense Refill  . aspirin 81 MG tablet Take 81 mg by mouth daily.    . cholecalciferol (VITAMIN D) 1000 UNITS tablet Take 1,000 Units by mouth daily.      . Cyanocobalamin (B-12) 2500 MCG TABS  Take 2,500 mcg by mouth daily.     [name] glucose blood (ONE TOUCH TEST STRIPS) test strip 1 each by Other route 2 (two) times daily. Use as instructed 100 each 12  . ipratropium (ATROVENT) 0.06 % nasal spray Place 2 sprays into the nose 3 (three) times daily. 45 mL 2  . Lancets (ONETOUCH ULTRASOFT) lancets Use as instructed 100 each 12  . levothyroxine (SYNTHROID) 125 MCG tablet Take 1 tablet (125 mcg total) by mouth daily before breakfast. 90 tablet 3  . loratadine (CLARITIN) 10 MG tablet Take 10 mg by mouth every other day.    . losartan-hydrochlorothiazide (HYZAAR) 50-12.5 MG per tablet Take 0.5 tablets by mouth every morning. 45 tablet 3  . meloxicam (MOBIC) 7.5 MG tablet Take 1-2 tablets (7.5-15 mg total) by mouth daily as needed for pain. 60 tablet 1  . Misc Natural Products (OSTEO BI-FLEX ADV DOUBLE ST) CAPS Take 1 capsule by mouth daily.    . Multiple Vitamins-Minerals (PRESERVISION AREDS PO) Take 1 tablet by mouth daily.    . naproxen sodium (ANAPROX) 220 MG tablet Take 220 mg by mouth daily as needed (pain).    [name] OVER THE COUNTER MEDICATION Take 1 tablet by mouth daily. Flex-aid    . Polyethyl [name] (SYSTANE) 0.4-0.3 % SOLN Place 1 drop into both eyes daily.     . polyethylene glycol (MIRALAX / GLYCOLAX) packet Take 17 g by mouth daily as needed  for moderate constipation.     . simvastatin (ZOCOR) 40 MG tablet Take 1 tablet (40 mg total) by mouth daily. 90 tablet 0  . sodium chloride (OCEAN) 0.65 % SOLN nasal spray Place 1 spray into both nostrils 2 (two) times daily as needed for congestion.    . pregabalin (LYRICA) 75 MG capsule Take 1 capsule (75 mg total) by mouth at bedtime. 90 capsule 3  . [DISCONTINUED] valsartan-hydrochlorothiazide (DIOVAN-HCT) 80-12.5 MG per tablet TAKE 1 TABLET BY MOUTH DAILY 90 tablet 3   No current facility-administered medications for this visit.    Physical Exam: Filed Vitals:   02/07/15 1501  BP: 136/68  Pulse: 70  Height: 5\' 5"  (1.651  m)  Weight: 150 lb 12.8 oz (68.402 kg)    GEN- The patient is elderly appearing, alert and oriented x 3 today.   Head- normocephalic, atraumatic Neck- R CEA site is healing well Eyes-  Sclera clear, conjunctiva pink Ears- hearing intact Oropharynx- clear Lungs- Clear to ausculation bilaterally, normal work of breathing Chest- pacemaker pocket is well healed Heart- Regular rate and rhythm  GI- soft, NT, ND, + BS Extremities- no clubbing, cyanosis, +1 L leg edema   Pacemaker interrogation- reviewed in detail today,  See PACEART report  Assessment and Plan:  1. Sick sinus bradycardia Normal pacemaker function See Claudia Desanctis Art report  2. Venous insufficiency Support hose encouraged  3. HTN Stable No change required today  4. SOB/ chest discomfort Both typical and atypical features Given advanced age, I would advise a conservative strategy Echo and myoview are ordered today.  If low to moderate risk, would manage medically.  carelink Return to see EP NP in 1 year

## 2015-02-21 ENCOUNTER — Telehealth (HOSPITAL_COMMUNITY): Payer: Self-pay | Admitting: *Deleted

## 2015-02-21 NOTE — Telephone Encounter (Signed)
Left message on voicemail per DPR in reference to upcoming appointment scheduled on 02/23/15 at 30 with detailed instructions given per Myocardial Perfusion Study Information Sheet for the test. LM to arrive 15 minutes early, and that it is imperative to arrive on time for appointment to keep from having the test rescheduled. If you need to cancel or reschedule your appointment, please call the office within 24 hours of your appointment. Failure to do so may result in a cancellation of your appointment, and a $50 no show fee. Phone number given for call back for any questions.  Hubbard Renfroe, RN

## 2015-02-23 ENCOUNTER — Other Ambulatory Visit: Payer: Self-pay

## 2015-02-23 ENCOUNTER — Ambulatory Visit (HOSPITAL_COMMUNITY): Payer: MEDICARE | Attending: Cardiology

## 2015-02-23 ENCOUNTER — Ambulatory Visit (INDEPENDENT_AMBULATORY_CARE_PROVIDER_SITE_OTHER): Payer: MEDICARE | Admitting: Internal Medicine

## 2015-02-23 ENCOUNTER — Ambulatory Visit (HOSPITAL_BASED_OUTPATIENT_CLINIC_OR_DEPARTMENT_OTHER): Payer: MEDICARE

## 2015-02-23 ENCOUNTER — Encounter: Payer: Self-pay | Admitting: Internal Medicine

## 2015-02-23 VITALS — BP 120/50 | HR 64 | Wt 147.0 lb

## 2015-02-23 DIAGNOSIS — Z01818 Encounter for other preprocedural examination: Secondary | ICD-10-CM | POA: Diagnosis not present

## 2015-02-23 DIAGNOSIS — I10 Essential (primary) hypertension: Secondary | ICD-10-CM | POA: Diagnosis not present

## 2015-02-23 DIAGNOSIS — I6521 Occlusion and stenosis of right carotid artery: Secondary | ICD-10-CM

## 2015-02-23 DIAGNOSIS — I779 Disorder of arteries and arterioles, unspecified: Secondary | ICD-10-CM | POA: Insufficient documentation

## 2015-02-23 DIAGNOSIS — R079 Chest pain, unspecified: Secondary | ICD-10-CM | POA: Insufficient documentation

## 2015-02-23 DIAGNOSIS — I352 Nonrheumatic aortic (valve) stenosis with insufficiency: Secondary | ICD-10-CM | POA: Insufficient documentation

## 2015-02-23 DIAGNOSIS — N183 Chronic kidney disease, stage 3 (moderate): Secondary | ICD-10-CM

## 2015-02-23 DIAGNOSIS — E785 Hyperlipidemia, unspecified: Secondary | ICD-10-CM | POA: Insufficient documentation

## 2015-02-23 DIAGNOSIS — I517 Cardiomegaly: Secondary | ICD-10-CM | POA: Diagnosis not present

## 2015-02-23 DIAGNOSIS — R0602 Shortness of breath: Secondary | ICD-10-CM | POA: Insufficient documentation

## 2015-02-23 DIAGNOSIS — E119 Type 2 diabetes mellitus without complications: Secondary | ICD-10-CM | POA: Diagnosis not present

## 2015-02-23 DIAGNOSIS — I059 Rheumatic mitral valve disease, unspecified: Secondary | ICD-10-CM | POA: Insufficient documentation

## 2015-02-23 DIAGNOSIS — E1122 Type 2 diabetes mellitus with diabetic chronic kidney disease: Secondary | ICD-10-CM

## 2015-02-23 DIAGNOSIS — E1165 Type 2 diabetes mellitus with hyperglycemia: Secondary | ICD-10-CM

## 2015-02-23 DIAGNOSIS — IMO0002 Reserved for concepts with insufficient information to code with codable children: Secondary | ICD-10-CM

## 2015-02-23 LAB — MYOCARDIAL PERFUSION IMAGING
CHL CUP NUCLEAR SDS: 5
CHL CUP NUCLEAR SRS: 1
CHL CUP RESTING HR STRESS: 64 {beats}/min
LV dias vol: 70 mL
LVSYSVOL: 21 mL
NUC STRESS TID: 0.97
Peak HR: 93 {beats}/min
RATE: 0.3
SSS: 6

## 2015-02-23 MED ORDER — REGADENOSON 0.4 MG/5ML IV SOLN
0.4000 mg | Freq: Once | INTRAVENOUS | Status: AC
  Administered 2015-02-23: 0.4 mg via INTRAVENOUS

## 2015-02-23 MED ORDER — TECHNETIUM TC 99M SESTAMIBI GENERIC - CARDIOLITE
31.6000 | Freq: Once | INTRAVENOUS | Status: AC | PRN
  Administered 2015-02-23: 32 via INTRAVENOUS

## 2015-02-23 MED ORDER — TECHNETIUM TC 99M SESTAMIBI GENERIC - CARDIOLITE
10.9000 | Freq: Once | INTRAVENOUS | Status: AC | PRN
  Administered 2015-02-23: 10.9 via INTRAVENOUS

## 2015-02-23 NOTE — Progress Notes (Signed)
Subjective:  Patient ID: Kayla Davis, female    DOB: [birth year]  Age: 81 y.o. MRN: [id number]  CC: No chief complaint on file.   HPI Kayla Davis presents for a surg clearance Req by Dr Wynelle Link  Reason: L THR 03/23/15 Hx: f/u HTN, carotid disease, DM - all stable  Outpatient Prescriptions Prior to Visit  Medication Sig Dispense Refill  . aspirin 81 MG tablet Take 81 mg by mouth daily.    . cholecalciferol (VITAMIN D) 1000 UNITS tablet Take 1,000 Units by mouth daily.      . Cyanocobalamin (B-12) 2500 MCG TABS Take 2,500 mcg by mouth daily.     [name] glucose blood (ONE TOUCH TEST STRIPS) test strip 1 each by Other route 2 (two) times daily. Use as instructed 100 each 12  . ipratropium (ATROVENT) 0.06 % nasal spray Place 2 sprays into the nose 3 (three) times daily. 45 mL 2  . Lancets (ONETOUCH ULTRASOFT) lancets Use as instructed 100 each 12  . levothyroxine (SYNTHROID) 125 MCG tablet Take 1 tablet (125 mcg total) by mouth daily before breakfast. 90 tablet 3  . loratadine (CLARITIN) 10 MG tablet Take 10 mg by mouth every other day.    . losartan-hydrochlorothiazide (HYZAAR) 50-12.5 MG per tablet Take 0.5 tablets by mouth every morning. 45 tablet 3  . Misc Natural Products (OSTEO BI-FLEX ADV DOUBLE ST) CAPS Take 1 capsule by mouth daily.    . Multiple Vitamins-Minerals (PRESERVISION AREDS PO) Take 1 tablet by mouth daily.    [name] OVER THE COUNTER MEDICATION Take 1 tablet by mouth daily. Flex-aid    . simvastatin (ZOCOR) 40 MG tablet Take 1 tablet (40 mg total) by mouth daily. 90 tablet 0  . sodium chloride (OCEAN) 0.65 % SOLN nasal spray Place 1 spray into both nostrils 2 (two) times daily as needed for congestion.    . meloxicam (MOBIC) 7.5 MG tablet Take 1-2 tablets (7.5-15 mg total) by mouth daily as needed for pain. (Patient not taking: Reported on 02/23/2015) 60 tablet 1  . naproxen sodium (ANAPROX) 220 MG tablet Take 220 mg by mouth daily as needed (pain).    [name]  [name] (SYSTANE) 0.4-0.3 % SOLN Place 1 drop into both eyes daily.     . polyethylene glycol (MIRALAX / GLYCOLAX) packet Take 17 g by mouth daily as needed for moderate constipation.     . pregabalin (LYRICA) 75 MG capsule Take 1 capsule (75 mg total) by mouth at bedtime. 90 capsule 3   No facility-administered medications prior to visit.    ROS Review of Systems  Constitutional: Negative for chills, activity change, appetite change, fatigue and unexpected weight change.  HENT: Negative for congestion, mouth sores and sinus pressure.   Eyes: Negative for visual disturbance.  Respiratory: Negative for cough and chest tightness.   Gastrointestinal: Negative for nausea and abdominal pain.  Genitourinary: Negative for frequency, difficulty urinating and vaginal pain.  Musculoskeletal: Positive for arthralgias and gait problem. Negative for back pain.  Skin: Negative for pallor and rash.  Neurological: Negative for dizziness, tremors, weakness, numbness and headaches.  Psychiatric/Behavioral: Negative for confusion and sleep disturbance.    Objective:  BP 120/50 mmHg  Pulse 64  Wt 147 lb (66.679 kg)  SpO2 96%  BP Readings from Last 3 Encounters:  02/23/15 120/50  02/07/15 136/68  01/23/15 138/80    Wt Readings from Last 3 Encounters:  02/23/15 147 lb (66.679 kg)  02/07/15 150 lb 12.8 oz (  68.402 kg)  01/23/15 147 lb (66.679 kg)    Physical Exam  Constitutional: She appears well-developed. No distress.  HENT:  Head: Normocephalic.  Right Ear: External ear normal.  Left Ear: External ear normal.  Nose: Nose normal.  Mouth/Throat: Oropharynx is clear and moist.  Eyes: Conjunctivae are normal. Pupils are equal, round, and reactive to light. Right eye exhibits no discharge. Left eye exhibits no discharge.  Neck: Normal range of motion. Neck supple. No JVD present. No tracheal deviation present. No thyromegaly present.  Cardiovascular: Normal rate, regular rhythm  and normal heart sounds.   Pulmonary/Chest: No stridor. No respiratory distress. She has no wheezes.  Abdominal: Soft. Bowel sounds are normal. She exhibits no distension and no mass. There is no tenderness. There is no rebound and no guarding.  Musculoskeletal: She exhibits tenderness. She exhibits no edema.  Lymphadenopathy:    She has no cervical adenopathy.  Neurological: She displays normal reflexes. No cranial nerve deficit. She exhibits normal muscle tone. Coordination abnormal.  Skin: No rash noted. No erythema.  Psychiatric: She has a normal mood and affect. Her behavior is normal. Judgment and thought content normal.    Lab Results  Component Value Date   WBC 5.6 06/28/2014   HGB 11.9* 06/28/2014   HCT 36.6 06/28/2014   PLT 158 06/28/2014   GLUCOSE 121* 11/22/2014   CHOL 161 01/17/2014   TRIG 56 01/17/2014   HDL 68 01/17/2014   LDLDIRECT 143.0 10/07/2007   LDLCALC 82 01/17/2014   ALT 19 04/21/2014   AST 24 04/21/2014   NA 136 11/22/2014   K 4.1 11/22/2014   CL 101 11/22/2014   CREATININE 0.83 11/22/2014   BUN 16 11/22/2014   CO2 29 11/22/2014   TSH 0.70 04/21/2014   INR 1.04 02/11/2010   HGBA1C 6.4 11/22/2014    Dg Lumbar Spine 2-3 Views  01/23/2015  CLINICAL DATA:  Severe low back pain for 5 days.  No known injury. EXAM: LUMBAR SPINE - 2-3 VIEW COMPARISON:  04/29/2013 FINDINGS: Mild rightward scoliosis. Severe diffuse degenerative disc and facet disease throughout the lumbar spine. 16 mm of anterolisthesis of L5 on S1, increased since prior study. Severe degenerative facet disease throughout the lumbar spine, most pronounced from L3-4 through L5-S1. No acute fracture. IMPRESSION: Severe diffuse degenerative disc and facet disease throughout the lumbar spine. Increasing anterolisthesis of L5 on S1, now 16 mm. No acute findings. Electronically Signed   By: Rolm Baptise M.D.   On: 01/23/2015 16:36    Assessment & Plan:   Diagnoses and all orders for this  visit:  Essential hypertension  Carotid stenosis, right  Uncontrolled type 2 diabetes mellitus with stage 3 chronic kidney disease, without long-term current use of insulin ([place])  Preop exam for internal medicine   I am having Ms. Quentin Cornwall maintain her B-12, cholecalciferol, polyethylene glycol, pregabalin, sodium chloride, onetouch ultrasoft, levothyroxine, OSTEO BI-FLEX ADV DOUBLE ST, OVER THE COUNTER MEDICATION, Multiple Vitamins-Minerals (PRESERVISION AREDS PO), loratadine, losartan-hydrochlorothiazide, aspirin, Polyethyl [name], glucose blood, ipratropium, simvastatin, meloxicam, naproxen sodium, and Polyvinyl Alcohol-Povidone (REFRESH OP).  Meds ordered this encounter  Medications  . Polyvinyl Alcohol-Povidone (REFRESH OP)    Sig: Apply 1 drop to eye 2 (two) times daily.     Follow-up: Return in about 4 months (around 06/23/2015) for a follow-up visit.  [name], MD

## 2015-02-23 NOTE — Progress Notes (Signed)
Pre visit review using our clinic review tool, if applicable. No additional management support is needed unless otherwise documented below in the visit note. 

## 2015-02-27 ENCOUNTER — Telehealth: Payer: Self-pay | Admitting: Internal Medicine

## 2015-02-27 NOTE — Telephone Encounter (Signed)
New Message  Pt calling about her results from echo/lexiscan and to ask about surgical clearance. Please call back and discuss.

## 2015-02-27 NOTE — Telephone Encounter (Signed)
Returned call.  Advised of echo and lexiscan results.  Patient says she talked to Dr [name] about whether she needs to see Dr Kellie Simmering.  She stated that Dr [name] was going to talk to him.  Wants a call to advise her.

## 2015-02-28 NOTE — Telephone Encounter (Signed)
lmom for patient that I have faxed over her clearance for her hip surgery.  Dr [name] does not know why she would need to see Dr Kellie Simmering.  She does not have to call me back unless she has more questions

## 2015-03-03 ENCOUNTER — Ambulatory Visit: Payer: Self-pay | Admitting: Orthopedic Surgery

## 2015-03-03 NOTE — Progress Notes (Signed)
Preoperative surgical orders have been place into the Epic hospital system for LYRA TURCHETTA on 03/03/2015, 4:39 PM  by Mickel Crow for surgery on 03/23/2015.  Preop Total Hip - Anterior Approach orders including IV Tylenol, and IV Decadron as long as there are no contraindications to the above medications. Arlee Muslim, PA-C

## 2015-03-04 ENCOUNTER — Encounter: Payer: Self-pay | Admitting: Internal Medicine

## 2015-03-04 DIAGNOSIS — Z01818 Encounter for other preprocedural examination: Secondary | ICD-10-CM | POA: Insufficient documentation

## 2015-03-04 NOTE — Assessment & Plan Note (Addendum)
Right carotid disease with 80% stenosis, status post right carotid endarterectomy 01/20/14 On ASA, simvastatin

## 2015-03-04 NOTE — Assessment & Plan Note (Signed)
11/16 The pt is clear for surgery assuming her pre-op tests are acceptable. Thank you!

## 2015-03-04 NOTE — Assessment & Plan Note (Signed)
Chronic  Losartan HCT

## 2015-03-15 ENCOUNTER — Other Ambulatory Visit (HOSPITAL_COMMUNITY): Payer: MEDICARE

## 2015-03-16 ENCOUNTER — Encounter (HOSPITAL_COMMUNITY): Payer: Self-pay

## 2015-03-16 ENCOUNTER — Encounter (HOSPITAL_COMMUNITY)
Admission: RE | Admit: 2015-03-16 | Discharge: 2015-03-16 | Disposition: A | Discharge status: Home / Self Care | Payer: MEDICARE | Source: Ambulatory Visit | Attending: Orthopedic Surgery | Admitting: Orthopedic Surgery

## 2015-03-16 DIAGNOSIS — M1612 Unilateral primary osteoarthritis, left hip: Secondary | ICD-10-CM | POA: Insufficient documentation

## 2015-03-16 DIAGNOSIS — Z01818 Encounter for other preprocedural examination: Secondary | ICD-10-CM | POA: Diagnosis not present

## 2015-03-16 HISTORY — DX: Asymptomatic varicose veins of unspecified lower extremity: I83.90

## 2015-03-16 HISTORY — DX: Bursopathy, unspecified: M71.9

## 2015-03-16 HISTORY — DX: Transient cerebral ischemic attack, unspecified: G45.9

## 2015-03-16 HISTORY — DX: Adverse effect of unspecified anesthetic, initial encounter: T41.45XA

## 2015-03-16 HISTORY — DX: Asymptomatic menopausal state: Z78.0

## 2015-03-16 HISTORY — DX: Peripheral vascular disease, unspecified: I73.9

## 2015-03-16 HISTORY — DX: Other complications of anesthesia, initial encounter: T88.59XA

## 2015-03-16 LAB — CBC
HEMATOCRIT: 35.8 % — AB (ref 36.0–46.0)
Hemoglobin: 11.6 g/dL — ABNORMAL LOW (ref 12.0–15.0)
MCH: 30.7 pg (ref 26.0–34.0)
MCHC: 32.4 g/dL (ref 30.0–36.0)
MCV: 94.7 fL (ref 78.0–100.0)
PLATELETS: 223 10*3/uL (ref 150–400)
RBC: 3.78 MIL/uL — AB (ref 3.87–5.11)
RDW: 12.9 % (ref 11.5–15.5)
WBC: 6.7 10*3/uL (ref 4.0–10.5)

## 2015-03-16 LAB — PROTIME-INR
INR: 1.06 (ref 0.00–1.49)
PROTHROMBIN TIME: 14 s (ref 11.6–15.2)

## 2015-03-16 LAB — COMPREHENSIVE METABOLIC PANEL
ALT: 15 U/L (ref 14–54)
AST: 18 U/L (ref 15–41)
Albumin: 3.9 g/dL (ref 3.5–5.0)
Alkaline Phosphatase: 92 U/L (ref 38–126)
Anion gap: 8 (ref 5–15)
BILIRUBIN TOTAL: 0.5 mg/dL (ref 0.3–1.2)
BUN: 28 mg/dL — AB (ref 6–20)
CHLORIDE: 108 mmol/L (ref 101–111)
CO2: 27 mmol/L (ref 22–32)
CREATININE: 0.76 mg/dL (ref 0.44–1.00)
Calcium: 10 mg/dL (ref 8.9–10.3)
GFR calc Af Amer: >60 mL/min (ref >60)
GLUCOSE: 92 mg/dL (ref 65–99)
Potassium: 5.2 mmol/L — ABNORMAL HIGH (ref 3.5–5.1)
Sodium: 143 mmol/L (ref 135–145)
Total Protein: 6.7 g/dL (ref 6.5–8.1)

## 2015-03-16 LAB — URINALYSIS, ROUTINE W REFLEX MICROSCOPIC
Bilirubin Urine: NEGATIVE
Glucose, UA: NEGATIVE mg/dL
KETONES UR: NEGATIVE mg/dL
NITRITE: POSITIVE — AB
PH: 5.5 (ref 5.0–8.0)
PROTEIN: NEGATIVE mg/dL
Specific Gravity, Urine: 1.019 (ref 1.005–1.030)

## 2015-03-16 LAB — SURGICAL PCR SCREEN
MRSA, PCR: NEGATIVE
Staphylococcus aureus: NEGATIVE

## 2015-03-16 LAB — URINE MICROSCOPIC-ADD ON

## 2015-03-16 LAB — APTT: aPTT: 32 seconds (ref 24–37)

## 2015-03-16 LAB — ABO/RH: ABO/RH(D): A POS

## 2015-03-16 NOTE — Progress Notes (Signed)
CMP,urinalysis and micro results in epic per PAT visit 03/16/2015 sent to Dr Wynelle Link

## 2015-03-16 NOTE — Patient Instructions (Signed)
Kayla Davis  03/16/2015   Your procedure is scheduled on: Friday March 23, 2015   Report to [facility] Main  Entrance take [place]  elevators to 3rd floor to  [place] at 7:30 AM.  Call this number if you have problems the morning of surgery [phone]   Remember: ONLY 1 PERSON MAY GO WITH YOU TO SHORT STAY TO GET  READY MORNING OF [place].  Do not eat food or drink liquids :After Midnight.     Take these medicines the morning of surgery with A SIP OF WATER: Levothyroxine; Loratadine (Claritin) if needed; May use eye drops if needed (bring with you day of surgery); May use nasal spray if needed (bring with you day of surgery) DO NOT TAKE ANY DIABETIC MEDICATIONS DAY OF YOUR SURGERY                               You may not have any metal on your body including hair pins and              piercings  Do not wear jewelry, make-up, lotions, powders or perfumes, deodorant             Do not wear nail polish.  Do not shave  48 hours prior to surgery.              Do not bring valuables to the hospital. [place].  Contacts, dentures or bridgework may not be worn into surgery.  Leave suitcase in the car. After surgery it may be brought to your room.                Please read over the following fact sheets you were given:MRSA INFORMATION SHEET; INCENTVE SPIROMETER; BLOOD TRANSFUSION INFORMATION SHEET _____________________________________________________________________             [facility] - Preparing for Surgery Before surgery, you can play an important role.  Because skin is not sterile, your skin needs to be as free of germs as possible.  You can reduce the number of germs on your skin by washing with CHG (chlorahexidine gluconate) soap before surgery.  CHG is an antiseptic cleaner which kills germs and bonds with the skin to continue killing germs even after washing. Please DO NOT use if you  have an allergy to CHG or antibacterial soaps.  If your skin becomes reddened/irritated stop using the CHG and inform your nurse when you arrive at Short Stay. Do not shave (including legs and underarms) for at least 48 hours prior to the first CHG shower.  You may shave your face/neck. Please follow these instructions carefully:  1.  Shower with CHG Soap the night before surgery and the  morning of Surgery.  2.  If you choose to wash your hair, wash your hair first as usual with your  normal  shampoo.  3.  After you shampoo, rinse your hair and body thoroughly to remove the  shampoo.                           4.  Use CHG as you would any other liquid soap.  You can apply chg directly  to the  skin and wash                       Gently with a scrungie or clean washcloth.  5.  Apply the CHG Soap to your body ONLY FROM THE NECK DOWN.   Do not use on face/ open                           Wound or open sores. Avoid contact with eyes, ears mouth and genitals (private parts).                       Wash face,  Genitals (private parts) with your normal soap.             6.  Wash thoroughly, paying special attention to the area where your surgery  will be performed.  7.  Thoroughly rinse your body with warm water from the neck down.  8.  DO NOT shower/wash with your normal soap after using and rinsing off  the CHG Soap.                9.  Pat yourself dry with a clean towel.            10.  Wear clean pajamas.            11.  Place clean sheets on your bed the night of your first shower and do not  sleep with pets. Day of Surgery : Do not apply any lotions/deodorants the morning of surgery.  Please wear clean clothes to the hospital/surgery center.  FAILURE TO FOLLOW THESE INSTRUCTIONS MAY RESULT IN THE CANCELLATION OF YOUR SURGERY PATIENT SIGNATURE_________________________________  NURSE  SIGNATURE__________________________________  ________________________________________________________________________   Adam Phenix  An incentive spirometer is a tool that can help keep your lungs clear and active. This tool measures how well you are filling your lungs with each breath. Taking long deep breaths may help reverse or decrease the chance of developing breathing (pulmonary) problems (especially infection) following:  A long period of time when you are unable to move or be active. BEFORE THE PROCEDURE   If the spirometer includes an indicator to show your best effort, your nurse or respiratory therapist will set it to a desired goal.  If possible, sit up straight or lean slightly forward. Try not to slouch.  Hold the incentive spirometer in an upright position. INSTRUCTIONS FOR USE   Sit on the edge of your bed if possible, or sit up as far as you can in bed or on a chair.  Hold the incentive spirometer in an upright position.  Breathe out normally.  Place the mouthpiece in your mouth and seal your lips tightly around it.  Breathe in slowly and as deeply as possible, raising the piston or the ball toward the top of the column.  Hold your breath for 3-5 seconds or for as long as possible. Allow the piston or ball to fall to the bottom of the column.  Remove the mouthpiece from your mouth and breathe out normally.  Rest for a few seconds and repeat Steps 1 through 7 at least 10 times every 1-2 hours when you are awake. Take your time and take a few normal breaths between deep breaths.  The spirometer may include an indicator to show your best effort. Use the indicator as a goal to work toward during each repetition.  After each set of  10 deep breaths, practice coughing to be sure your lungs are clear. If you have an incision (the cut made at the time of surgery), support your incision when coughing by placing a pillow or rolled up towels firmly against it. Once  you are able to get out of bed, walk around indoors and cough well. You may stop using the incentive spirometer when instructed by your caregiver.  RISKS AND COMPLICATIONS  Take your time so you do not get dizzy or light-headed.  If you are in pain, you may need to take or ask for pain medication before doing incentive spirometry. It is harder to take a deep breath if you are having pain. AFTER USE  Rest and breathe slowly and easily.  It can be helpful to keep track of a log of your progress. Your caregiver can provide you with a simple table to help with this. If you are using the spirometer at home, follow these instructions: [place] IF:   You are having difficultly using the spirometer.  You have trouble using the spirometer as often as instructed.  Your pain medication is not giving enough relief while using the spirometer.  You develop fever of 100.5 F (38.1 C) or higher. SEEK IMMEDIATE MEDICAL CARE IF:   You cough up bloody sputum that had not been present before.  You develop fever of 102 F (38.9 C) or greater.  You develop worsening pain at or near the incision site. MAKE SURE YOU:   Understand these instructions.  Will watch your condition.  Will get help right away if you are not doing well or get worse. Document Released: 08/11/2006 Document Revised: 06/23/2011 Document Reviewed: 10/12/2006 ExitCare Patient Information 2014 ExitCare, [place].   ________________________________________________________________________  WHAT IS A BLOOD TRANSFUSION? Blood Transfusion Information  A transfusion is the replacement of blood or some of its parts. Blood is made up of multiple cells which provide different functions.  Red blood cells carry oxygen and are used for blood loss replacement.  White blood cells fight against infection.  Platelets control bleeding.  Plasma helps clot blood.  Other blood products are available for specialized needs, such as  hemophilia or other clotting disorders. BEFORE THE TRANSFUSION  Who gives blood for transfusions?   Healthy volunteers who are fully evaluated to make sure their blood is safe. This is blood bank blood. Transfusion therapy is the safest it has ever been in the practice of medicine. Before blood is taken from a donor, a complete history is taken to make sure that person has no history of diseases nor engages in risky social behavior (examples are intravenous drug use or sexual activity with multiple partners). The donor's travel history is screened to minimize risk of transmitting infections, such as malaria. The donated blood is tested for signs of infectious diseases, such as HIV and hepatitis. The blood is then tested to be sure it is compatible with you in order to minimize the chance of a transfusion reaction. If you or a relative donates blood, this is often done in anticipation of surgery and is not appropriate for emergency situations. It takes many days to process the donated blood. RISKS AND COMPLICATIONS Although transfusion therapy is very safe and saves many lives, the main dangers of transfusion include:   Getting an infectious disease.  Developing a transfusion reaction. This is an allergic reaction to something in the blood you were given. Every precaution is taken to prevent this. The decision to have a blood  transfusion has been considered carefully by your caregiver before blood is given. Blood is not given unless the benefits outweigh the risks. AFTER THE TRANSFUSION  Right after receiving a blood transfusion, you will usually feel much better and more energetic. This is especially true if your red blood cells have gotten low (anemic). The transfusion raises the level of the red blood cells which carry oxygen, and this usually causes an energy increase.  The nurse administering the transfusion will monitor you carefully for complications. HOME CARE INSTRUCTIONS  No special  instructions are needed after a transfusion. You may find your energy is better. Speak with your caregiver about any limitations on activity for underlying diseases you may have. SEEK MEDICAL CARE IF:   Your condition is not improving after your transfusion.  You develop redness or irritation at the intravenous (IV) site. SEEK IMMEDIATE MEDICAL CARE IF:  Any of the following symptoms occur over the next 12 hours:  Shaking chills.  You have a temperature by mouth above 102 F (38.9 C), not controlled by medicine.  Chest, back, or muscle pain.  People around you feel you are not acting correctly or are confused.  Shortness of breath or difficulty breathing.  Dizziness and fainting.  You get a rash or develop hives.  You have a decrease in urine output.  Your urine turns a dark color or changes to pink, red, or brown. Any of the following symptoms occur over the next 10 days:  You have a temperature by mouth above 102 F (38.9 C), not controlled by medicine.  Shortness of breath.  Weakness after normal activity.  The white part of the eye turns yellow (jaundice).  You have a decrease in the amount of urine or are urinating less often.  Your urine turns a dark color or changes to pink, red, or brown. Document Released: 03/28/2000 Document Revised: 06/23/2011 Document Reviewed: 11/15/2007 [facility] Patient Information 2014 [place], [place].  _______________________________________________________________________

## 2015-03-16 NOTE — Progress Notes (Signed)
CXR epic 08/30/2014 EKG epic 06/29/2014 Stress test epic 02/23/2015 ECHO epic 02/23/2015 Clearance per Dr [name]/cardiology per chart  Dr Massagee/anesthesia in to see pt.

## 2015-03-21 ENCOUNTER — Telehealth: Payer: Self-pay | Admitting: *Deleted

## 2015-03-21 MED ORDER — LEVOTHYROXINE SODIUM 125 MCG PO TABS: 125.0000 ug | ORAL_TABLET | Freq: Every day | ORAL | Status: DC

## 2015-03-21 NOTE — Telephone Encounter (Signed)
Received call pt states she is needing her levothyroxine sent to [facility]. Inform pt will send electronically...Johny Chess

## 2015-03-21 NOTE — Progress Notes (Signed)
Received fax from [facility] orth[facility]  Stating Cipro had been called in for patient   On chart

## 2015-03-22 ENCOUNTER — Ambulatory Visit: Payer: Self-pay | Admitting: Orthopedic Surgery

## 2015-03-22 NOTE — H&P (Signed)
Kayla Davis DOB: 05-11-1922 Married / Language: English / Race: White Female Date of Admission:  03/23/2015 CC:  Left Hip Pain History of Present Illness The patient is a 81 year old female who comes in for a preoperative History and Physical. The patient is scheduled for a left total hip arthroplasty (anterior) to be performed by Dr. Dione Plover. Aluisio, MD at [facility] on 03-23-2015. The patient reports left hip (groin) problems including pain (weightbearing. OA) and popping symptoms that have been present for 3-4 year(s). The patient reports symptoms radiating to the: left groin and left thigh.The patient feels as if their symptoms are does feel they are worsening. Current treatment includes application of heat and use of a walker. Previous treatment for this problem has included corticosteroid injection (IA injection 2 months ago right groin by [facility] imaging). She has had some relief from the cortisone injections in her left hip but the injection that she had 2 months ago has already worn off. She is having pain at rest and with WB. She has some pain in the right hip but not nearly to the extent of the left hip. The left hip has gotten progressively worse over time. She is having pain at all times including pain at rest. She says the pain is in her groin, radiating into her thigh. It is very much limiting what she can and cannot do. It has effectively taken over her life at this point. She has had an intra-articular injection in the hip on a few occasions, but they have not lasted long. She would like to get the hip fixed at this time. They have been treated conservatively in the past for the above stated problem and despite conservative measures, they continue to have progressive pain and severe functional limitations and dysfunction. They have failed non-operative management including home exercise, medications, and injections. It is felt that they would benefit from undergoing total  joint replacement. Risks and benefits of the procedure have been discussed with the patient and they elect to proceed with surgery. There are no active contraindications to surgery such as ongoing infection or rapidly progressive neurological disease.  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)  Hip pain, acute, left (M25.552)  Bursitis, hip (M70.70)  Osteoarthritis, Thoracic (M19.90)  Cerebrovascular Accident  Heart murmur  Diabetes Mellitus, Type II  Chronic Pain  Spondylolisthesis, acquired (M43.10)  Osteoarthritis  Neuropathy, idiopathic peripheral NOS (356.9) 10/03/1996 Hypothyroidism  High blood pressure  Peripheral Vascular Disease  Hypercholesterolemia  Peripheral Neuropathy  Impaired Hearing  uses hearing aids Anxiety Disorder  episodic Shingles  Pacemaker  Varicose veins  History of Gastritis  Hemorrhoids  Urinary Incontinence  Urinary Tract Infection  Osteoporosis  Measles  Mumps  Rubella  Menopause  Hypoglycemia  Allergies SULFA Aug 21, 1996 Itching. VICODIN Aug 21, 1996 OV Aug 21, 1996:VERIFY WITH PATIENT PENICILLIN 08-21-1996 Swelling. "Almost Died" LORAZEPAM 1996/08/21 OV 08/21/96:VERIFY WITH PATIENT XANAX 08-21-1996 OV 1996/08/21:VERIFY WITH PATIENT BACTRIM 08-21-96 OV 08-21-96:VERIFY WITH PATIENT BIAXIN 08/21/96 OV Aug 21, 1996:VERIFY WITH PATIENT CECLOR Aug 21, 1996 Can't remember reaction Ciprofloxacin *Fluoroquinolones**  Swelling.  Family History Hypertension  First Degree Relatives. mother, father and sister Heart disease in female family member before age 23  Diabetes Mellitus  mother and sister Congestive Heart Failure  mother and father Heart disease in female family member before age 4  Heart Disease  mother, father, sister and brother  Social History Pain Contract  no Number of flights of stairs before  winded   1 Exercise  Exercises never Drug/Alcohol Rehab (Previously)  no Drug/Alcohol Rehab (Currently)  no Illicit drug use  no Children  4 Marital status  widowed Living situation  live alone Alcohol use  never consumed alcohol No alcohol use  Current work status  retired Tobacco use  Never smoker. former smoker [place] with family following the Left Total Hip Replacement on 03/23/2015 Advance Directives  Living Will, Healthcare POA  Medication History Lyrica (75MG  Capsule, 1 (one) Capsule Oral two times daily, Taken starting 11/21/2013) Active. Tylenol (325MG  Tablet, Oral) Active. Synthroid (125MCG Tablet, Oral) Active. Alendronate Sodium (70MG  Tablet, Oral) Active. Simvastatin (40MG  Tablet, Oral) Active. Valsartan-Hydrochlorothiazide (80-12.5MG  Tablet, Oral) Active. Aspirin (81MG  Tablet Chewable, Oral) Active. B-12 (2500MCG Tablet, Oral) Active. Vitamin D (1000UNIT Tablet, Oral) Active. Atrovent (0.06% Solution, Nasal) Active. Loratadine (10MG  Tablet, Oral) Active. Mobic (7.5MG  Tablet, Oral) Active. Naproxen Sodium (220MG  Tablet, Oral) Active. Osteo Bi-Flex Adv Double St (Oral) Active. MiraLax (Oral) Active. PreserVision AREDS (Oral) Active. Systane (0.4-0.3% Solution, Ophthalmic) Active.  Past Surgical History  Cataract Surgery  bilateral Hysterectomy  partial (non-cancerous) Appendectomy  Gallbladder Surgery  Date: 17. open Tonsillectomy  Pacemaker Placement  Carotid Artery Surgery  Date: 01/2014. D & C   Review of Systems General Not Present- Chills, Fatigue, Fever, Memory Loss, Night Sweats, Weight Gain and Weight Loss. Skin Not Present- Eczema, Hives, Itching, Lesions and Rash. HEENT Present- Hearing Loss (uses hearing aids). Not Present- Dentures, Double Vision, Headache, Tinnitus and Visual Loss. Respiratory Not Present- Allergies, Chronic Cough, Coughing up blood, Shortness of breath at rest and Shortness of  breath with exertion. Cardiovascular Not Present- Chest Pain, Difficulty Breathing Lying Down, Murmur, Palpitations, Racing/skipping heartbeats and Swelling. Gastrointestinal Present- Constipation. Not Present- Abdominal Pain, Bloody Stool, Diarrhea, Difficulty Swallowing, Heartburn, Jaundice, Loss of appetitie, Nausea and Vomiting. Female Genitourinary Present- Incontinence, Urinary frequency and Urinating at Night. Not Present- Blood in Urine, Discharge, Flank Pain, Painful Urination, Urgency, Urinary Retention and Weak urinary stream. Musculoskeletal Present- Back Pain, Joint Pain, Joint Swelling, Morning Stiffness and Muscle Pain. Not Present- Muscle Weakness and Spasms. Neurological Not Present- Blackout spells, Difficulty with balance, Dizziness, Paralysis, Tremor and Weakness. Psychiatric Not Present- Insomnia.  Vitals Weight: 150 lb Height: 64.5in Weight was reported by patient. Height was reported by patient. Body Surface Area: 1.74 m Body Mass Index: 25.35 kg/m  BP: 120/48 (Sitting, Right Arm, Standard)  Physical Exam General Mental Status -Alert, cooperative and good historian. General Appearance-pleasant, Not in acute distress. Orientation-Oriented X3. Build & Nutrition-Well nourished and Well developed.  Head and Neck Head-normocephalic, atraumatic . Neck Global Assessment - supple, no bruit auscultated on the right, no bruit auscultated on the left.  Eye Vision-Wears corrective lenses. Pupil - Bilateral-Regular and Round. Motion - Bilateral-EOMI.  ENMT Note: hearing aids   Chest and Lung Exam Auscultation Breath sounds - clear at anterior chest wall and clear at posterior chest wall. Adventitious sounds - No Adventitious sounds.  Cardiovascular Auscultation Rhythm - Regular rate and rhythm. Heart Sounds - S1 WNL and S2 WNL. Murmurs & Other Heart Sounds: Murmur 1 - Location - Aortic Area. Timing - Holosystolic. Grade - III/VI.  Character - Holosystolic.  Abdomen Palpation/Percussion Tenderness - Abdomen is non-tender to palpation. Rigidity (guarding) - Abdomen is soft. Auscultation Auscultation of the abdomen reveals - Bowel sounds normal.  Female Genitourinary Note: Not done, not pertinent to present illness   Musculoskeletal Note: She is alert and oriented, in no apparent distress. Her right hip has  near-normal motion discomfort. Her left hip can be flexed to about 90 with no rotation and minimal abduction.  RADIOGRAPHS AP pelvis, lateral of the left hip, show severe, advanced arthritic change, bone-on-bone with erosion of some of the acetabulum, femoral head. Her right hip shows some slight changes, but nowhere near as the left.  Assessment & Plan Primary localized osteoarthrosis of left hip (M16.12)  Note:Surgical Plans: Left Total Hip Replacement - Anterior Approach  Disposition: Home with family  PCP: Dr. Loni Muse. Plotinikov Cards: Dr. [name] - Patient has been seen preoperatively and felt to be stable for surgery. "At increased risks given her age."  Topical TXA  Anesthesia Issues: None  Signed electronically by Joelene Millin, III PA-C

## 2015-03-23 ENCOUNTER — Encounter (HOSPITAL_COMMUNITY)
Admission: RE | Disposition: A | Discharge status: Home Health | Payer: Self-pay | Source: Ambulatory Visit | Attending: Orthopedic Surgery

## 2015-03-23 ENCOUNTER — Ambulatory Visit: Payer: MEDICARE | Admitting: Internal Medicine

## 2015-03-23 ENCOUNTER — Inpatient Hospital Stay (HOSPITAL_COMMUNITY): Payer: MEDICARE

## 2015-03-23 ENCOUNTER — Inpatient Hospital Stay (HOSPITAL_COMMUNITY)
Admission: RE | Admit: 2015-03-23 | Discharge: 2015-03-25 | DRG: 470 | Disposition: A | Discharge status: Home Health | Payer: MEDICARE | Source: Ambulatory Visit | Attending: Orthopedic Surgery | Admitting: Orthopedic Surgery

## 2015-03-23 ENCOUNTER — Encounter (HOSPITAL_COMMUNITY): Payer: Self-pay | Admitting: Anesthesiology

## 2015-03-23 ENCOUNTER — Inpatient Hospital Stay (HOSPITAL_COMMUNITY): Payer: MEDICARE | Admitting: Anesthesiology

## 2015-03-23 DIAGNOSIS — I739 Peripheral vascular disease, unspecified: Secondary | ICD-10-CM | POA: Diagnosis present

## 2015-03-23 DIAGNOSIS — Z950 Presence of cardiac pacemaker: Secondary | ICD-10-CM

## 2015-03-23 DIAGNOSIS — Z79899 Other long term (current) drug therapy: Secondary | ICD-10-CM

## 2015-03-23 DIAGNOSIS — E785 Hyperlipidemia, unspecified: Secondary | ICD-10-CM | POA: Diagnosis present

## 2015-03-23 DIAGNOSIS — Z8673 Personal history of transient ischemic attack (TIA), and cerebral infarction without residual deficits: Secondary | ICD-10-CM

## 2015-03-23 DIAGNOSIS — E119 Type 2 diabetes mellitus without complications: Secondary | ICD-10-CM | POA: Diagnosis present

## 2015-03-23 DIAGNOSIS — M810 Age-related osteoporosis without current pathological fracture: Secondary | ICD-10-CM | POA: Diagnosis present

## 2015-03-23 DIAGNOSIS — Z471 Aftercare following joint replacement surgery: Secondary | ICD-10-CM | POA: Diagnosis not present

## 2015-03-23 DIAGNOSIS — E039 Hypothyroidism, unspecified: Secondary | ICD-10-CM | POA: Diagnosis present

## 2015-03-23 DIAGNOSIS — Z96649 Presence of unspecified artificial hip joint: Secondary | ICD-10-CM

## 2015-03-23 DIAGNOSIS — I10 Essential (primary) hypertension: Secondary | ICD-10-CM | POA: Diagnosis present

## 2015-03-23 DIAGNOSIS — M169 Osteoarthritis of hip, unspecified: Secondary | ICD-10-CM | POA: Diagnosis present

## 2015-03-23 DIAGNOSIS — Z87440 Personal history of urinary (tract) infections: Secondary | ICD-10-CM

## 2015-03-23 DIAGNOSIS — M25552 Pain in left hip: Secondary | ICD-10-CM | POA: Diagnosis not present

## 2015-03-23 DIAGNOSIS — G609 Hereditary and idiopathic neuropathy, unspecified: Secondary | ICD-10-CM | POA: Diagnosis present

## 2015-03-23 DIAGNOSIS — Z7982 Long term (current) use of aspirin: Secondary | ICD-10-CM

## 2015-03-23 DIAGNOSIS — M1612 Unilateral primary osteoarthritis, left hip: Principal | ICD-10-CM | POA: Diagnosis present

## 2015-03-23 DIAGNOSIS — Z01812 Encounter for preprocedural laboratory examination: Secondary | ICD-10-CM

## 2015-03-23 DIAGNOSIS — H9193 Unspecified hearing loss, bilateral: Secondary | ICD-10-CM | POA: Diagnosis present

## 2015-03-23 DIAGNOSIS — Z96642 Presence of left artificial hip joint: Secondary | ICD-10-CM | POA: Diagnosis not present

## 2015-03-23 HISTORY — PX: TOTAL HIP ARTHROPLASTY: SHX124

## 2015-03-23 LAB — GLUCOSE, CAPILLARY
GLUCOSE-CAPILLARY: 107 mg/dL — AB (ref 65–99)
Glucose-Capillary: 100 mg/dL — ABNORMAL HIGH (ref 65–99)

## 2015-03-23 SURGERY — ARTHROPLASTY, HIP, TOTAL, ANTERIOR APPROACH
Anesthesia: Spinal | Site: Hip | Laterality: Left

## 2015-03-23 MED ORDER — SIMVASTATIN 40 MG PO TABS
40.0000 mg | ORAL_TABLET | Freq: Every day | ORAL | Status: DC
  Administered 2015-03-23 – 2015-03-24 (×2): 40 mg via ORAL
  Filled 2015-03-23 (×3): qty 1

## 2015-03-23 MED ORDER — DOCUSATE SODIUM 100 MG PO CAPS
100.0000 mg | ORAL_CAPSULE | Freq: Two times a day (BID) | ORAL | Status: DC
  Administered 2015-03-23 – 2015-03-25 (×4): 100 mg via ORAL

## 2015-03-23 MED ORDER — MEPERIDINE HCL 50 MG/ML IJ SOLN: 6.2500 mg | INTRAMUSCULAR | Status: DC | PRN

## 2015-03-23 MED ORDER — BISACODYL 10 MG RE SUPP
10.0000 mg | Freq: Every day | RECTAL | Status: DC | PRN
  Administered 2015-03-23: 10 mg via RECTAL
  Filled 2015-03-23: qty 1

## 2015-03-23 MED ORDER — FENTANYL CITRATE (PF) 100 MCG/2ML IJ SOLN: 25.0000 ug | INTRAMUSCULAR | Status: DC | PRN

## 2015-03-23 MED ORDER — CHLORHEXIDINE GLUCONATE 4 % EX LIQD: 60.0000 mL | Freq: Once | CUTANEOUS | Status: DC

## 2015-03-23 MED ORDER — DEXAMETHASONE SODIUM PHOSPHATE 10 MG/ML IJ SOLN
10.0000 mg | Freq: Once | INTRAMUSCULAR | Status: AC
  Administered 2015-03-23: 10 mg via INTRAVENOUS

## 2015-03-23 MED ORDER — EPHEDRINE SULFATE 50 MG/ML IJ SOLN
INTRAMUSCULAR | Status: AC
  Filled 2015-03-23: qty 1

## 2015-03-23 MED ORDER — LACTATED RINGERS IV SOLN: INTRAVENOUS | Status: DC

## 2015-03-23 MED ORDER — MIDAZOLAM HCL 2 MG/2ML IJ SOLN
INTRAMUSCULAR | Status: AC
  Filled 2015-03-23: qty 2

## 2015-03-23 MED ORDER — SODIUM CHLORIDE 0.45 % IV SOLN
INTRAVENOUS | Status: DC
Start: 2015-03-23 — End: 2015-03-25
  Administered 2015-03-23: 15:00:00 via INTRAVENOUS

## 2015-03-23 MED ORDER — PHENYLEPHRINE HCL 10 MG/ML IJ SOLN
INTRAMUSCULAR | Status: DC | PRN
  Administered 2015-03-23 (×5): 80 ug via INTRAVENOUS

## 2015-03-23 MED ORDER — ACETAMINOPHEN 325 MG PO TABS
650.0000 mg | ORAL_TABLET | Freq: Four times a day (QID) | ORAL | Status: DC | PRN
  Administered 2015-03-24 – 2015-03-25 (×3): 650 mg via ORAL
  Filled 2015-03-23 (×3): qty 2

## 2015-03-23 MED ORDER — SODIUM CHLORIDE 0.9 % IV SOLN: INTRAVENOUS | Status: DC

## 2015-03-23 MED ORDER — DEXAMETHASONE SODIUM PHOSPHATE 10 MG/ML IJ SOLN
INTRAMUSCULAR | Status: AC
  Filled 2015-03-23: qty 1

## 2015-03-23 MED ORDER — LOSARTAN POTASSIUM 25 MG PO TABS: 25.0000 mg | ORAL_TABLET | Freq: Every day | ORAL | Status: DC

## 2015-03-23 MED ORDER — ROCURONIUM BROMIDE 100 MG/10ML IV SOLN
INTRAVENOUS | Status: AC
  Filled 2015-03-23: qty 1

## 2015-03-23 MED ORDER — ACETAMINOPHEN 10 MG/ML IV SOLN
1000.0000 mg | Freq: Once | INTRAVENOUS | Status: AC
  Administered 2015-03-23: 1000 mg via INTRAVENOUS
  Filled 2015-03-23: qty 100

## 2015-03-23 MED ORDER — BUPIVACAINE IN DEXTROSE 0.75-8.25 % IT SOLN
INTRATHECAL | Status: DC | PRN
  Administered 2015-03-23: 1.8 mL via INTRATHECAL

## 2015-03-23 MED ORDER — PREGABALIN 75 MG PO CAPS
75.0000 mg | ORAL_CAPSULE | Freq: Every day | ORAL | Status: DC
  Administered 2015-03-23 – 2015-03-24 (×2): 75 mg via ORAL
  Filled 2015-03-23 (×2): qty 1

## 2015-03-23 MED ORDER — ONDANSETRON HCL 4 MG PO TABS: 4.0000 mg | ORAL_TABLET | Freq: Four times a day (QID) | ORAL | Status: DC | PRN

## 2015-03-23 MED ORDER — LOSARTAN POTASSIUM 25 MG PO TABS
25.0000 mg | ORAL_TABLET | Freq: Every day | ORAL | Status: DC
  Administered 2015-03-24: 25 mg via ORAL
  Filled 2015-03-23 (×2): qty 1

## 2015-03-23 MED ORDER — MORPHINE SULFATE (PF) 2 MG/ML IV SOLN: 1.0000 mg | INTRAVENOUS | Status: DC | PRN

## 2015-03-23 MED ORDER — FENTANYL CITRATE (PF) 100 MCG/2ML IJ SOLN
INTRAMUSCULAR | Status: AC
  Filled 2015-03-23: qty 2

## 2015-03-23 MED ORDER — VANCOMYCIN HCL IN DEXTROSE 1-5 GM/200ML-% IV SOLN
1000.0000 mg | INTRAVENOUS | Status: AC
  Administered 2015-03-23: 1000 mg via INTRAVENOUS
  Filled 2015-03-23: qty 200

## 2015-03-23 MED ORDER — METHOCARBAMOL 500 MG PO TABS: 500.0000 mg | ORAL_TABLET | Freq: Four times a day (QID) | ORAL | Status: DC | PRN

## 2015-03-23 MED ORDER — DIPHENHYDRAMINE HCL 12.5 MG/5ML PO ELIX
12.5000 mg | ORAL_SOLUTION | ORAL | Status: DC | PRN
Start: 2015-03-23 — End: 2015-03-25

## 2015-03-23 MED ORDER — SODIUM CHLORIDE 0.9 % IJ SOLN
INTRAMUSCULAR | Status: AC
  Filled 2015-03-23: qty 10

## 2015-03-23 MED ORDER — METOCLOPRAMIDE HCL 5 MG/ML IJ SOLN: 5.0000 mg | Freq: Three times a day (TID) | INTRAMUSCULAR | Status: DC | PRN

## 2015-03-23 MED ORDER — LIDOCAINE HCL (CARDIAC) 20 MG/ML IV SOLN
INTRAVENOUS | Status: AC
  Filled 2015-03-23: qty 5

## 2015-03-23 MED ORDER — PROPOFOL 10 MG/ML IV BOLUS
INTRAVENOUS | Status: AC
  Filled 2015-03-23: qty 20

## 2015-03-23 MED ORDER — FLEET ENEMA 7-19 GM/118ML RE ENEM: 1.0000 | ENEMA | Freq: Once | RECTAL | Status: DC | PRN

## 2015-03-23 MED ORDER — OXYCODONE HCL 5 MG PO TABS
5.0000 mg | ORAL_TABLET | ORAL | Status: DC | PRN
  Filled 2015-03-23: qty 2

## 2015-03-23 MED ORDER — POLYETHYLENE GLYCOL 3350 17 G PO PACK: 17.0000 g | PACK | Freq: Every day | ORAL | Status: DC | PRN

## 2015-03-23 MED ORDER — RIVAROXABAN 10 MG PO TABS
10.0000 mg | ORAL_TABLET | Freq: Every day | ORAL | Status: DC
  Administered 2015-03-24 – 2015-03-25 (×2): 10 mg via ORAL
  Filled 2015-03-23 (×3): qty 1

## 2015-03-23 MED ORDER — METOCLOPRAMIDE HCL 10 MG PO TABS: 5.0000 mg | ORAL_TABLET | Freq: Three times a day (TID) | ORAL | Status: DC | PRN

## 2015-03-23 MED ORDER — LORATADINE 10 MG PO TABS
10.0000 mg | ORAL_TABLET | Freq: Every day | ORAL | Status: DC | PRN
  Filled 2015-03-23: qty 1

## 2015-03-23 MED ORDER — PROMETHAZINE HCL 25 MG/ML IJ SOLN: 6.2500 mg | INTRAMUSCULAR | Status: DC | PRN

## 2015-03-23 MED ORDER — TRANEXAMIC ACID 1000 MG/10ML IV SOLN
2000.0000 mg | Freq: Once | INTRAVENOUS | Status: DC
  Filled 2015-03-23: qty 20

## 2015-03-23 MED ORDER — LACTATED RINGERS IV SOLN
INTRAVENOUS | Status: DC | PRN
  Administered 2015-03-23 (×3): via INTRAVENOUS

## 2015-03-23 MED ORDER — DEXAMETHASONE SODIUM PHOSPHATE 10 MG/ML IJ SOLN
10.0000 mg | Freq: Once | INTRAMUSCULAR | Status: AC
  Administered 2015-03-24: 10 mg via INTRAVENOUS
  Filled 2015-03-23: qty 1

## 2015-03-23 MED ORDER — LEVOTHYROXINE SODIUM 125 MCG PO TABS
125.0000 ug | ORAL_TABLET | Freq: Every day | ORAL | Status: DC
  Administered 2015-03-24 – 2015-03-25 (×2): 125 ug via ORAL
  Filled 2015-03-23 (×3): qty 1

## 2015-03-23 MED ORDER — HYDROCHLOROTHIAZIDE 10 MG/ML ORAL SUSPENSION: 6.2500 mg | Freq: Every day | ORAL | Status: DC

## 2015-03-23 MED ORDER — TRANEXAMIC ACID 1000 MG/10ML IV SOLN
2000.0000 mg | INTRAVENOUS | Status: DC | PRN
  Administered 2015-03-23: 2000 mg via TOPICAL

## 2015-03-23 MED ORDER — BUPIVACAINE HCL (PF) 0.25 % IJ SOLN
INTRAMUSCULAR | Status: DC | PRN
  Administered 2015-03-23: 20 mL

## 2015-03-23 MED ORDER — ACETAMINOPHEN 650 MG RE SUPP: 650.0000 mg | Freq: Four times a day (QID) | RECTAL | Status: DC | PRN

## 2015-03-23 MED ORDER — ONDANSETRON HCL 4 MG/2ML IJ SOLN
INTRAMUSCULAR | Status: AC
  Filled 2015-03-23: qty 2

## 2015-03-23 MED ORDER — PROPOFOL 500 MG/50ML IV EMUL
INTRAVENOUS | Status: DC | PRN
  Administered 2015-03-23: 75 ug/kg/min via INTRAVENOUS

## 2015-03-23 MED ORDER — ONDANSETRON HCL 4 MG/2ML IJ SOLN: 4.0000 mg | Freq: Four times a day (QID) | INTRAMUSCULAR | Status: DC | PRN

## 2015-03-23 MED ORDER — LOSARTAN POTASSIUM-HCTZ 50-12.5 MG PO TABS: 0.5000 | ORAL_TABLET | Freq: Every day | ORAL | Status: DC

## 2015-03-23 MED ORDER — PHENOL 1.4 % MT LIQD: 1.0000 | OROMUCOSAL | Status: DC | PRN

## 2015-03-23 MED ORDER — BUPIVACAINE HCL (PF) 0.25 % IJ SOLN
INTRAMUSCULAR | Status: AC
  Filled 2015-03-23: qty 30

## 2015-03-23 MED ORDER — ACETAMINOPHEN 10 MG/ML IV SOLN
INTRAVENOUS | Status: AC
  Filled 2015-03-23: qty 100

## 2015-03-23 MED ORDER — HYDROCHLOROTHIAZIDE 10 MG/ML ORAL SUSPENSION
6.2500 mg | Freq: Every day | ORAL | Status: DC
  Administered 2015-03-24: 6.25 mg via ORAL
  Filled 2015-03-23 (×3): qty 1.25

## 2015-03-23 MED ORDER — ONDANSETRON HCL 4 MG/2ML IJ SOLN
INTRAMUSCULAR | Status: DC | PRN
  Administered 2015-03-23: 4 mg via INTRAVENOUS

## 2015-03-23 MED ORDER — VANCOMYCIN HCL IN DEXTROSE 1-5 GM/200ML-% IV SOLN
1000.0000 mg | Freq: Two times a day (BID) | INTRAVENOUS | Status: AC
  Administered 2015-03-23: 1000 mg via INTRAVENOUS
  Filled 2015-03-23 (×2): qty 200

## 2015-03-23 MED ORDER — IPRATROPIUM BROMIDE 0.06 % NA SOLN
2.0000 | Freq: Two times a day (BID) | NASAL | Status: DC
Start: 2015-03-23 — End: 2015-03-25
  Administered 2015-03-23 – 2015-03-25 (×3): 2 via NASAL
  Filled 2015-03-23: qty 15

## 2015-03-23 MED ORDER — ACETAMINOPHEN 500 MG PO TABS
1000.0000 mg | ORAL_TABLET | Freq: Four times a day (QID) | ORAL | Status: AC
  Administered 2015-03-23 – 2015-03-24 (×3): 1000 mg via ORAL
  Filled 2015-03-23 (×4): qty 2

## 2015-03-23 MED ORDER — MENTHOL 3 MG MT LOZG: 1.0000 | LOZENGE | OROMUCOSAL | Status: DC | PRN

## 2015-03-23 MED ORDER — METHOCARBAMOL 1000 MG/10ML IJ SOLN
500.0000 mg | Freq: Four times a day (QID) | INTRAVENOUS | Status: DC | PRN
  Administered 2015-03-23: 500 mg via INTRAVENOUS
  Filled 2015-03-23 (×2): qty 5

## 2015-03-23 MED ORDER — PHENYLEPHRINE HCL 10 MG/ML IJ SOLN
10.0000 mg | INTRAVENOUS | Status: DC | PRN
  Administered 2015-03-23: 35 ug/min via INTRAVENOUS

## 2015-03-23 MED ORDER — FENTANYL CITRATE (PF) 100 MCG/2ML IJ SOLN
INTRAMUSCULAR | Status: DC | PRN
  Administered 2015-03-23 (×2): 50 ug via INTRAVENOUS

## 2015-03-23 MED ORDER — STERILE WATER FOR IRRIGATION IR SOLN
Status: DC | PRN
  Administered 2015-03-23: 1000 mL

## 2015-03-23 SURGICAL SUPPLY — 34 items
BAG DECANTER FOR FLEXI CONT (MISCELLANEOUS) ×2 IMPLANT
BAG SPEC THK2 15X12 ZIP CLS (MISCELLANEOUS)
BAG ZIPLOCK 12X15 (MISCELLANEOUS) IMPLANT
BLADE SAG 18X100X1.27 (BLADE) ×2 IMPLANT
CAPT HIP TOTAL 2 ×1 IMPLANT
CLOTH BEACON ORANGE TIMEOUT ST (SAFETY) ×2 IMPLANT
COVER PERINEAL POST (MISCELLANEOUS) ×2 IMPLANT
DECANTER SPIKE VIAL GLASS SM (MISCELLANEOUS) ×1 IMPLANT
DRAPE STERI IOBAN 125X83 (DRAPES) ×2 IMPLANT
DRAPE U-SHAPE 47X51 STRL (DRAPES) ×4 IMPLANT
DRSG ADAPTIC 3X8 NADH LF (GAUZE/BANDAGES/DRESSINGS) ×2 IMPLANT
DRSG MEPILEX BORDER 4X4 (GAUZE/BANDAGES/DRESSINGS) ×2 IMPLANT
DRSG MEPILEX BORDER 4X8 (GAUZE/BANDAGES/DRESSINGS) ×2 IMPLANT
DURAPREP 26ML APPLICATOR (WOUND CARE) ×2 IMPLANT
ELECT REM PT RETURN 9FT ADLT (ELECTROSURGICAL) ×2
ELECTRODE REM PT RTRN 9FT ADLT (ELECTROSURGICAL) ×1 IMPLANT
EVACUATOR 1/8 PVC DRAIN (DRAIN) ×2 IMPLANT
GLOVE BIO SURGEON STRL SZ7.5 (GLOVE) ×2 IMPLANT
GLOVE BIO SURGEON STRL SZ8 (GLOVE) ×3 IMPLANT
GLOVE BIOGEL PI IND STRL 8 (GLOVE) ×2 IMPLANT
GLOVE BIOGEL PI INDICATOR 8 (GLOVE) ×2
GOWN STRL REUS W/TWL LRG LVL3 (GOWN DISPOSABLE) ×2 IMPLANT
GOWN STRL REUS W/TWL XL LVL3 (GOWN DISPOSABLE) ×2 IMPLANT
PACK ANTERIOR HIP CUSTOM (KITS) ×2 IMPLANT
STRIP CLOSURE SKIN 1/2X4 (GAUZE/BANDAGES/DRESSINGS) ×3 IMPLANT
SUT ETHIBOND NAB CT1 #1 30IN (SUTURE) ×2 IMPLANT
SUT MNCRL AB 4-0 PS2 18 (SUTURE) ×2 IMPLANT
SUT VIC AB 2-0 CT1 27 (SUTURE) ×4
SUT VIC AB 2-0 CT1 TAPERPNT 27 (SUTURE) ×2 IMPLANT
SUT VLOC 180 0 24IN GS25 (SUTURE) ×2 IMPLANT
SYR 50ML LL SCALE MARK (SYRINGE) IMPLANT
TRAY FOLEY W/METER SILVER 14FR (SET/KITS/TRAYS/PACK) ×2 IMPLANT
TRAY FOLEY W/METER SILVER 16FR (SET/KITS/TRAYS/PACK) ×1 IMPLANT
YANKAUER SUCT BULB TIP 10FT TU (MISCELLANEOUS) ×2 IMPLANT

## 2015-03-23 NOTE — Anesthesia Postprocedure Evaluation (Addendum)
Anesthesia Post Note  Patient: Kayla Davis  Procedure(s) Performed: Procedure(s) (LRB): TOTAL HIP ARTHROPLASTY ANTERIOR APPROACH (Left)  Patient location during evaluation: PACU Anesthesia Type: Spinal Level of consciousness: oriented and awake and alert Pain management: pain level controlled Vital Signs Assessment: post-procedure vital signs reviewed and stable Respiratory status: spontaneous breathing, respiratory function stable and patient connected to nasal cannula oxygen Cardiovascular status: blood pressure returned to baseline and stable Postop Assessment: no headache and no backache Anesthetic complications: no    Last Vitals:  Filed Vitals:   03/23/15 1350 03/23/15 1454  BP: 146/70 141/57  Pulse: 60 62  Temp: 36.4 C 36.5 C  Resp:      Last Pain:  Filed Vitals:   03/23/15 1454  PainSc: 0-No pain                 Montez Hageman

## 2015-03-23 NOTE — Op Note (Signed)
OPERATIVE REPORT  PREOPERATIVE DIAGNOSIS: Osteoarthritis of the Left hip.   POSTOPERATIVE DIAGNOSIS: Osteoarthritis of the Left  hip.   PROCEDURE: Left total hip arthroplasty, anterior approach.   SURGEON: Gaynelle Arabian, MD   ASSISTANT: Arlee Muslim, PA_C  ANESTHESIA:  Spinal  ESTIMATED BLOOD LOSS:-300 ml   DRAINS: Hemovac x1.   COMPLICATIONS: None   CONDITION: PACU - hemodynamically stable.   BRIEF CLINICAL NOTE: Kayla Davis is a 81 y.o. female who has advanced end-  stage arthritis of her Left  hip with progressively worsening pain and  dysfunction.The patient has failed nonoperative management and presents for  total hip arthroplasty.   PROCEDURE IN DETAIL: After successful administration of spinal  anesthetic, the traction boots for the [facility] bed were placed on both  feet and the patient was placed onto the [facility] bed, boots placed into the leg  holders. The Left hip was then isolated from the perineum with plastic  drapes and prepped and draped in the usual sterile fashion. ASIS and  greater trochanter were marked and a oblique incision was made, starting  at about 1 cm lateral and 2 cm distal to the ASIS and coursing towards  the anterior cortex of the femur. The skin was cut with a 10 blade  through subcutaneous tissue to the level of the fascia overlying the  tensor fascia lata muscle. The fascia was then incised in line with the  incision at the junction of the anterior third and posterior 2/3rd. The  muscle was teased off the fascia and then the interval between the TFL  and the rectus was developed. The Hohmann retractor was then placed at  the top of the femoral neck over the capsule. The vessels overlying the  capsule were cauterized and the fat on top of the capsule was removed.  A Hohmann retractor was then placed anterior underneath the rectus  femoris to give exposure to the entire anterior capsule. A T-shaped  capsulotomy was performed. The  edges were tagged and the femoral head  was identified.       Osteophytes are removed off the superior acetabulum.  The femoral neck was then cut in situ with an oscillating saw. Traction  was then applied to the left lower extremity utilizing the [facility]  traction. The femoral head was then removed. Retractors were placed  around the acetabulum and then circumferential removal of the labrum was  performed. Osteophytes were also removed. Reaming starts at 43 mm to  medialize and  Increased in 2 mm increments to 49 mm. We reamed in  approximately 40 degrees of abduction, 20 degrees anteversion. A 50 mm  pinnacle acetabular shell was then impacted in anatomic position under  fluoroscopic guidance with excellent purchase. We did not need to place  any additional dome screws. A 32 mm neutral + 4 marathon liner was then  placed into the acetabular shell.       The femoral lift was then placed along the lateral aspect of the femur  just distal to the vastus ridge. The leg was  externally rotated and capsule  was stripped off the inferior aspect of the femoral neck down to the  level of the lesser trochanter, this was done with electrocautery. The femur was lifted after this was performed. The  leg was then placed and extended in adducted position to essentially delivering the femur. We also removed the capsule superiorly and the  piriformis from the piriformis  fossa to gain excellent exposure of the  proximal femur. Rongeur was used to remove some cancellous bone to get  into the lateral portion of the proximal femur for placement of the  initial starter reamer. The starter broaches was placed  the starter broach  and was shown to go down the center of the canal. Broaching  with the  Corail system was then performed starting at size 8, coursing  Up to size 11. A size 11 had excellent torsional and rotational  and axial stability. The trial standard offset neck was then placed  with a 32 + 5 trial  head. The hip was then reduced. We confirmed that  the stem was in the canal both on AP and lateral x-rays. It also has excellent sizing. The hip was reduced with outstanding stability through full extension, full external rotation,  and then flexion in adduction internal rotation. AP pelvis was taken  and the leg lengths were measured and found to be exactly equal. Hip  was then dislocated again and the femoral head and neck removed. The  femoral broach was removed. Size 11 Corail stem with a standard offset  neck was then impacted into the femur following native anteversion. Has  excellent purchase in the canal. Excellent torsional and rotational and  axial stability. It is confirmed to be in the canal on AP and lateral  fluoroscopic views. The 32 + 5 ceramic head was placed and the hip  reduced with outstanding stability. Again AP pelvis was taken and it  confirmed that the leg lengths were equal. The wound was then copiously  irrigated with saline solution and the capsule reattached and repaired  with Ethibond suture. 30 ml of .25% Bupivicaine injected into the capsule and into the edge of the tensor fascia lata as well as subcutaneous tissue. The fascia overlying the tensor fascia lata was  then closed with a running #1 V-Loc. Subcu was closed with interrupted  2-0 Vicryl and subcuticular running 4-0 Monocryl. Incision was cleaned  and dried. Steri-Strips and a bulky sterile dressing applied. Hemovac  drain was hooked to suction and then he was awakened and transported to  recovery in stable condition.        Please note that a surgical assistant was a medical necessity for this procedure to perform it in a safe and expeditious manner. Assistant was necessary to provide appropriate retraction of vital neurovascular structures and to prevent femoral fracture and allow for anatomic placement of the prosthesis.  Gaynelle Arabian, M.D.

## 2015-03-23 NOTE — Anesthesia Procedure Notes (Signed)
Spinal Patient location during procedure: OR Start time: 03/23/2015 9:30 AM End time: 03/23/2015 9:36 AM Staffing Resident/CRNA: Jonquil Stubbe Performed by: resident/CRNA  Preanesthetic Checklist Completed: patient identified, site marked, surgical consent, pre-op evaluation, timeout performed, IV checked, risks and benefits discussed and monitors and equipment checked Spinal Block Patient position: sitting Prep: Betadine Patient monitoring: heart rate, cardiac monitor, continuous pulse ox and blood pressure Approach: midline Location: L3-4 Injection technique: single-shot Needle Needle type: Spinocan  Needle gauge: 22 G Needle length: 9 cm Needle insertion depth: 7 cm Assessment Sensory level: T6 Additional Notes -heme, -para, VSS. Lot  [id number] Ex  2016-08-11

## 2015-03-23 NOTE — Anesthesia Preprocedure Evaluation (Addendum)
Anesthesia Evaluation  Patient identified by MRN, date of birth, ID band Patient awake    Reviewed: Allergy & Precautions, NPO status , Patient's Chart, lab work & pertinent test results  Airway Mallampati: II  TM Distance: >3 FB Neck ROM: Full    Dental no notable dental hx.    Pulmonary neg pulmonary ROS,    Pulmonary exam normal breath sounds clear to auscultation       Cardiovascular hypertension, Normal cardiovascular exam+ pacemaker  Rhythm:Regular Rate:Normal     Neuro/Psych TIAnegative psych ROS   GI/Hepatic negative GI ROS, Neg liver ROS,   Endo/Other  diabetes  Renal/GU negative Renal ROS  negative genitourinary   Musculoskeletal negative musculoskeletal ROS (+)   Abdominal   Peds negative pediatric ROS (+)  Hematology negative hematology ROS (+)   Anesthesia Other Findings   Reproductive/Obstetrics negative OB ROS                           Anesthesia Physical Anesthesia Plan  ASA: II  Anesthesia Plan: Spinal   Post-op Pain Management:    Induction: Intravenous  Airway Management Planned: Simple Face Mask  Additional Equipment:   Intra-op Plan:   Post-operative Plan:   Informed Consent: I have reviewed the patients History and Physical, chart, labs and discussed the procedure including the risks, benefits and alternatives for the proposed anesthesia with the patient or authorized representative who has indicated his/her understanding and acceptance.   Dental advisory given  Plan Discussed with: CRNA  Anesthesia Plan Comments:         Anesthesia Quick Evaluation

## 2015-03-23 NOTE — Transfer of Care (Signed)
Immediate Anesthesia Transfer of Care Note  Patient: Kayla Davis  Procedure(s) Performed: Procedure(s): TOTAL HIP ARTHROPLASTY ANTERIOR APPROACH (Left)  Patient Location: PACU  Anesthesia Type:Spinal  Level of Consciousness:  sedated, patient cooperative and responds to stimulation  Airway & Oxygen Therapy:Patient Spontanous Breathing and Patient connected to face mask oxgen  Post-op Assessment:  Report given to PACU RN and Post -op Vital signs reviewed and stable  Post vital signs:  Reviewed and stable  Last Vitals:  Filed Vitals:   03/23/15 0721  BP: 145/61  Pulse: 71  Temp: 36.4 C  Resp: 16    Complications: No apparent anesthesia complications

## 2015-03-23 NOTE — Evaluation (Signed)
Physical Therapy Evaluation Patient Details Name: Kayla Davis MRN: [id number] DOB: November 14, 1922 Today's Date: 03/23/2015   History of Present Illness  L THR; hx of pacemaker, TIA and PVD  Clinical Impression  Pt s/p L THR presents with decreased L LE strength/ROM and post op pain limiting functional mobility.  Pt should progress to dc home with 24/7 family assist and HHPT follow up.    Follow Up Recommendations Home health PT    Equipment Recommendations  None recommended by PT    Recommendations for Other Services OT consult     Precautions / Restrictions Precautions Precautions: Fall Restrictions Weight Bearing Restrictions: No Other Position/Activity Restrictions: WBAT      Mobility  Bed Mobility Overal bed mobility: Needs Assistance Bed Mobility: Supine to Sit     Supine to sit: Min assist;Mod assist;+2 for physical assistance;+2 for safety/equipment     General bed mobility comments: cues for sequence and use of R LE to self assist  Transfers Overall transfer level: Needs assistance Equipment used: Rolling walker (2 wheeled) Transfers: Sit to/from Stand Sit to Stand: Min assist;+2 physical assistance;+2 safety/equipment         General transfer comment: cues for LE management and use of UEs to self assist  Ambulation/Gait Ambulation/Gait assistance: Min assist;+2 physical assistance;+2 safety/equipment Ambulation Distance (Feet): 14 Feet Assistive device: Rolling walker (2 wheeled) Gait Pattern/deviations: Step-to pattern;Decreased step length - right;Decreased step length - left;Shuffle;Trunk flexed     General Gait Details: cues for sequence, posture and position from [employer]            Wheelchair Mobility    Modified Rankin (Stroke Patients Only)       Balance                                             Pertinent Vitals/Pain Pain Assessment: 0-10 Pain Score: 2  Pain Location: L hip Pain Descriptors /  Indicators: Aching;Sore Pain Intervention(s): Limited activity within patient's tolerance;Monitored during session;Ice applied    Home Living Family/patient expects to be discharged to:: Private residence Living Arrangements: Alone Available Help at Discharge: Family;Available 24 hours/day (Pt states niece will be staying with her) Type of Home: House Home Access: Ramped entrance     Home Layout: One level Home Equipment: Transport chair;Grab bars - toilet;Grab bars - tub/shower;Walker - 2 wheels;Bedside commode      Prior Function Level of Independence: Independent with assistive device(s)               Hand Dominance        Extremity/Trunk Assessment   Upper Extremity Assessment: Generalized weakness           Lower Extremity Assessment: LLE deficits/detail      Cervical / Trunk Assessment: Kyphotic  Communication   Communication: No difficulties;HOH  Cognition Arousal/Alertness: Awake/alert Behavior During Therapy: WFL for tasks assessed/performed Overall Cognitive Status: Within Functional Limits for tasks assessed                      General Comments      Exercises        Assessment/Plan    PT Assessment Patient needs continued PT services  PT Diagnosis Difficulty walking   PT Problem List Decreased strength;Decreased range of motion;Decreased activity tolerance;Decreased mobility;Decreased knowledge of use of DME;Pain  PT Treatment Interventions DME instruction;Gait  training;Stair training;Functional mobility training;Therapeutic activities;Therapeutic exercise;Patient/family education   PT Goals (Current goals can be found in the Care Plan section) Acute Rehab PT Goals Patient Stated Goal: Resume previous lifestyle with decreased pain PT Goal Formulation: With patient Time For Goal Achievement: 03/29/15 Potential to Achieve Goals: Good    Frequency 7X/week   Barriers to discharge        Co-evaluation                End of Session Equipment Utilized During Treatment: Gait belt Activity Tolerance: Patient tolerated treatment well Patient left: in chair;with call bell/phone within reach;with family/visitor present;with nursing/sitter in room Nurse Communication: Mobility status         Time: [id number] PT Time Calculation (min) (ACUTE ONLY): 28 min   Charges:   PT Evaluation $Initial PT Evaluation Tier I: 1 Procedure PT Treatments $Gait Training: 8-22 mins   PT G Codes:        Pauletta Pickney 04/10/15, 5:47 PM

## 2015-03-23 NOTE — Interval H&P Note (Signed)
History and Physical Interval Note:  03/23/2015 8:43 AM  Kayla Davis  has presented today for surgery, with the diagnosis of OA LEFT HIP  The various methods of treatment have been discussed with the patient and family. After consideration of risks, benefits and other options for treatment, the patient has consented to  Procedure(s): TOTAL HIP ARTHROPLASTY ANTERIOR APPROACH (Left) as a surgical intervention .  The patient's history has been reviewed, patient examined, no change in status, stable for surgery.  I have reviewed the patient's chart and labs.  Questions were answered to the patient's satisfaction.     [name]

## 2015-03-23 NOTE — H&P (View-Only) (Signed)
Kayla Davis DOB: 03-07-1923 Married / Language: English / Race: White Female Date of Admission:  03/23/2015 CC:  Left Hip Pain History of Present Illness The patient is a 81 year old female who comes in for a preoperative History and Physical. The patient is scheduled for a left total hip arthroplasty (anterior) to be performed by Dr. Dione Plover. Aluisio, MD at [facility] on 03-23-2015. The patient reports left hip (groin) problems including pain (weightbearing. OA) and popping symptoms that have been present for 3-4 year(s). The patient reports symptoms radiating to the: left groin and left thigh.The patient feels as if their symptoms are does feel they are worsening. Current treatment includes application of heat and use of a walker. Previous treatment for this problem has included corticosteroid injection (IA injection 2 months ago right groin by [facility] imaging). She has had some relief from the cortisone injections in her left hip but the injection that she had 2 months ago has already worn off. She is having pain at rest and with WB. She has some pain in the right hip but not nearly to the extent of the left hip. The left hip has gotten progressively worse over time. She is having pain at all times including pain at rest. She says the pain is in her groin, radiating into her thigh. It is very much limiting what she can and cannot do. It has effectively taken over her life at this point. She has had an intra-articular injection in the hip on a few occasions, but they have not lasted long. She would like to get the hip fixed at this time. They have been treated conservatively in the past for the above stated problem and despite conservative measures, they continue to have progressive pain and severe functional limitations and dysfunction. They have failed non-operative management including home exercise, medications, and injections. It is felt that they would benefit from undergoing total  joint replacement. Risks and benefits of the procedure have been discussed with the patient and they elect to proceed with surgery. There are no active contraindications to surgery such as ongoing infection or rapidly progressive neurological disease.  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)  Hip pain, acute, left (M25.552)  Bursitis, hip (M70.70)  Osteoarthritis, Thoracic (M19.90)  Cerebrovascular Accident  Heart murmur  Diabetes Mellitus, Type II  Chronic Pain  Spondylolisthesis, acquired (M43.10)  Osteoarthritis  Neuropathy, idiopathic peripheral NOS (356.9) 10/03/1996 Hypothyroidism  High blood pressure  Peripheral Vascular Disease  Hypercholesterolemia  Peripheral Neuropathy  Impaired Hearing  uses hearing aids Anxiety Disorder  episodic Shingles  Pacemaker  Varicose veins  History of Gastritis  Hemorrhoids  Urinary Incontinence  Urinary Tract Infection  Osteoporosis  Measles  Mumps  Rubella  Menopause  Hypoglycemia  Allergies SULFA Aug 30, 1996 Itching. VICODIN 1996/08/30 OV 30-Aug-1996:VERIFY WITH PATIENT PENICILLIN 08/30/1996 Swelling. "Almost Died" LORAZEPAM Aug 30, 1996 OV 1996-08-30:VERIFY WITH PATIENT XANAX 08/30/1996 OV 08/30/1996:VERIFY WITH PATIENT BACTRIM 08/30/1996 OV 08-30-1996:VERIFY WITH PATIENT BIAXIN 1996/08/30 OV Aug 30, 1996:VERIFY WITH PATIENT CECLOR 08/30/1996 Can't remember reaction Ciprofloxacin *Fluoroquinolones**  Swelling.  Family History Hypertension  First Degree Relatives. mother, father and sister Heart disease in female family member before age 31  Diabetes Mellitus  mother and sister Congestive Heart Failure  mother and father Heart disease in female family member before age 110  Heart Disease  mother, father, sister and brother  Social History Pain Contract  no Number of flights of stairs before  winded   1 Exercise  Exercises never Drug/Alcohol Rehab (Previously)  no Drug/Alcohol Rehab (Currently)  no Illicit drug use  no Children  4 Marital status  widowed Living situation  live alone Alcohol use  never consumed alcohol No alcohol use  Current work status  retired Tobacco use  Never smoker. former smoker [place] with family following the Left Total Hip Replacement on 03/23/2015 Advance Directives  Living Will, Healthcare POA  Medication History Lyrica (75MG  Capsule, 1 (one) Capsule Oral two times daily, Taken starting 11/21/2013) Active. Tylenol (325MG  Tablet, Oral) Active. Synthroid (125MCG Tablet, Oral) Active. Alendronate Sodium (70MG  Tablet, Oral) Active. Simvastatin (40MG  Tablet, Oral) Active. Valsartan-Hydrochlorothiazide (80-12.5MG  Tablet, Oral) Active. Aspirin (81MG  Tablet Chewable, Oral) Active. B-12 (2500MCG Tablet, Oral) Active. Vitamin D (1000UNIT Tablet, Oral) Active. Atrovent (0.06% Solution, Nasal) Active. Loratadine (10MG  Tablet, Oral) Active. Mobic (7.5MG  Tablet, Oral) Active. Naproxen Sodium (220MG  Tablet, Oral) Active. Osteo Bi-Flex Adv Double St (Oral) Active. MiraLax (Oral) Active. PreserVision AREDS (Oral) Active. Systane (0.4-0.3% Solution, Ophthalmic) Active.  Past Surgical History  Cataract Surgery  bilateral Hysterectomy  partial (non-cancerous) Appendectomy  Gallbladder Surgery  Date: 92. open Tonsillectomy  Pacemaker Placement  Carotid Artery Surgery  Date: 01/2014. D & C   Review of Systems General Not Present- Chills, Fatigue, Fever, Memory Loss, Night Sweats, Weight Gain and Weight Loss. Skin Not Present- Eczema, Hives, Itching, Lesions and Rash. HEENT Present- Hearing Loss (uses hearing aids). Not Present- Dentures, Double Vision, Headache, Tinnitus and Visual Loss. Respiratory Not Present- Allergies, Chronic Cough, Coughing up blood, Shortness of breath at rest and Shortness of  breath with exertion. Cardiovascular Not Present- Chest Pain, Difficulty Breathing Lying Down, Murmur, Palpitations, Racing/skipping heartbeats and Swelling. Gastrointestinal Present- Constipation. Not Present- Abdominal Pain, Bloody Stool, Diarrhea, Difficulty Swallowing, Heartburn, Jaundice, Loss of appetitie, Nausea and Vomiting. Female Genitourinary Present- Incontinence, Urinary frequency and Urinating at Night. Not Present- Blood in Urine, Discharge, Flank Pain, Painful Urination, Urgency, Urinary Retention and Weak urinary stream. Musculoskeletal Present- Back Pain, Joint Pain, Joint Swelling, Morning Stiffness and Muscle Pain. Not Present- Muscle Weakness and Spasms. Neurological Not Present- Blackout spells, Difficulty with balance, Dizziness, Paralysis, Tremor and Weakness. Psychiatric Not Present- Insomnia.  Vitals Weight: 150 lb Height: 64.5in Weight was reported by patient. Height was reported by patient. Body Surface Area: 1.74 m Body Mass Index: 25.35 kg/m  BP: 120/48 (Sitting, Right Arm, Standard)  Physical Exam General Mental Status -Alert, cooperative and good historian. General Appearance-pleasant, Not in acute distress. Orientation-Oriented X3. Build & Nutrition-Well nourished and Well developed.  Head and Neck Head-normocephalic, atraumatic . Neck Global Assessment - supple, no bruit auscultated on the right, no bruit auscultated on the left.  Eye Vision-Wears corrective lenses. Pupil - Bilateral-Regular and Round. Motion - Bilateral-EOMI.  ENMT Note: hearing aids   Chest and Lung Exam Auscultation Breath sounds - clear at anterior chest wall and clear at posterior chest wall. Adventitious sounds - No Adventitious sounds.  Cardiovascular Auscultation Rhythm - Regular rate and rhythm. Heart Sounds - S1 WNL and S2 WNL. Murmurs & Other Heart Sounds: Murmur 1 - Location - Aortic Area. Timing - Holosystolic. Grade - III/VI.  Character - Holosystolic.  Abdomen Palpation/Percussion Tenderness - Abdomen is non-tender to palpation. Rigidity (guarding) - Abdomen is soft. Auscultation Auscultation of the abdomen reveals - Bowel sounds normal.  Female Genitourinary Note: Not done, not pertinent to present illness   Musculoskeletal Note: She is alert and oriented, in no apparent distress. Her right hip has  near-normal motion discomfort. Her left hip can be flexed to about 90 with no rotation and minimal abduction.  RADIOGRAPHS AP pelvis, lateral of the left hip, show severe, advanced arthritic change, bone-on-bone with erosion of some of the acetabulum, femoral head. Her right hip shows some slight changes, but nowhere near as the left.  Assessment & Plan Primary localized osteoarthrosis of left hip (M16.12)  Note:Surgical Plans: Left Total Hip Replacement - Anterior Approach  Disposition: Home with family  PCP: Dr. Loni Muse. Plotinikov Cards: Dr. [name] - Patient has been seen preoperatively and felt to be stable for surgery. "At increased risks given her age."  Topical TXA  Anesthesia Issues: None  Signed electronically by Joelene Millin, III PA-C

## 2015-03-24 LAB — BASIC METABOLIC PANEL
Anion gap: 7 (ref 5–15)
BUN: 20 mg/dL (ref 6–20)
CALCIUM: 8.5 mg/dL — AB (ref 8.9–10.3)
CHLORIDE: 104 mmol/L (ref 101–111)
CO2: 24 mmol/L (ref 22–32)
CREATININE: 0.66 mg/dL (ref 0.44–1.00)
Glucose, Bld: 165 mg/dL — ABNORMAL HIGH (ref 65–99)
Potassium: 4.2 mmol/L (ref 3.5–5.1)
Sodium: 135 mmol/L (ref 135–145)

## 2015-03-24 LAB — CBC
HEMATOCRIT: 27.3 % — AB (ref 36.0–46.0)
HEMOGLOBIN: 9.2 g/dL — AB (ref 12.0–15.0)
MCH: 31.2 pg (ref 26.0–34.0)
MCHC: 33.7 g/dL (ref 30.0–36.0)
MCV: 92.5 fL (ref 78.0–100.0)
Platelets: 154 10*3/uL (ref 150–400)
RBC: 2.95 MIL/uL — ABNORMAL LOW (ref 3.87–5.11)
RDW: 12.5 % (ref 11.5–15.5)
WBC: 12.3 10*3/uL — ABNORMAL HIGH (ref 4.0–10.5)

## 2015-03-24 NOTE — Care Management Note (Signed)
Case Management Note  Patient Details  Name: Kayla Davis MRN: [id number] Date of Birth: 03/02/1923  Subjective/Objective:   TOTAL HIP ARTHROPLASTY                  Action/Plan: NCM spoke to pt and offered choice for [name]. Pt agreeable to [facility] for [facility]. States she has RW and 3n1 at home. States son, [phone] and dtr will be able to assist her at home if needed.   Expected Discharge Date:  03/25/2015               Expected Discharge Plan:  [place]  In-House Referral:  NA  Discharge planning Services  CM Consult  Post Acute Care Choice:  Home Health Choice offered to:  Patient  DME Arranged:  N/A DME Agency:  NA  HH Arranged:  PT HH Agency:  [place]  Status of Service:  Completed, signed off  Medicare Important Message Given:    Date Medicare IM Given:    Medicare IM give by:    Date Additional Medicare IM Given:    Additional Medicare Important Message give by:     If discussed at [place] of Stay Meetings, dates discussed:    Additional Comments:  Erenest Rasher, RN 03/24/2015, 6:04 PM

## 2015-03-24 NOTE — Plan of Care (Signed)
Problem: Activity: Goal: Will remain free from falls Outcome: Progressing Falls prevention protocol maintained. Reviewed with patient and reinforced the importance of adherence to safety measures to reduce likelihood for falls or fall-related injuries, such as appropriate use of the call bell,  maintaining the bed in low and locked position, and  keeping needed items within easy access. Patient verbalized understanding and demonstrated compliance.

## 2015-03-24 NOTE — Progress Notes (Signed)
Physical Therapy Treatment Patient Details Name: Kayla Davis MRN: [id number] DOB: [birth year] Today's Date: 2015/04/22    History of Present Illness L THR; hx of pacemaker, TIA and PVD    PT Comments    Pt very motivated and progressing well with mobility  Follow Up Recommendations  Home health PT     Equipment Recommendations  None recommended by PT    Recommendations for Other Services OT consult     Precautions / Restrictions Precautions Precautions: Fall Restrictions Weight Bearing Restrictions: Yes LLE Weight Bearing: Weight bearing as tolerated    Mobility  Bed Mobility Overal bed mobility: Needs Assistance Bed Mobility: Supine to Sit     Supine to sit: Min assist     General bed mobility comments: cues for sequence and use of R LE to self assist  Transfers Overall transfer level: Needs assistance Equipment used: Rolling walker (2 wheeled) Transfers: Sit to/from Stand Sit to Stand: Min assist         General transfer comment: cues for LE management and use of UEs to self assist  Ambulation/Gait Ambulation/Gait assistance: Min assist Ambulation Distance (Feet): 55 Feet Assistive device: Rolling walker (2 wheeled) Gait Pattern/deviations: Step-to pattern;Decreased step length - right;Decreased step length - left;Shuffle;Trunk flexed Gait velocity: decre   General Gait Details: cues for sequence, posture and position from [employer]            Wheelchair Mobility    Modified Rankin (Stroke Patients Only)       Balance                                    Cognition Arousal/Alertness: Awake/alert Behavior During Therapy: WFL for tasks assessed/performed Overall Cognitive Status: Within Functional Limits for tasks assessed                      Exercises Total Joint Exercises Ankle Circles/Pumps: AROM;Both;15 reps;Supine Quad Sets: AROM;Both;10 reps;Supine Heel Slides: AAROM;Left;15 reps;Supine Hip  ABduction/ADduction: AAROM;Left;10 reps;Supine    General Comments        Pertinent Vitals/Pain Pain Assessment: 0-10 Pain Score: 3  Pain Location: back and L hip Pain Descriptors / Indicators: Aching;Sore Pain Intervention(s): Limited activity within patient's tolerance;Monitored during session;Premedicated before session;Ice applied    Home Living                      Prior Function            PT Goals (current goals can now be found in the care plan section) Acute Rehab PT Goals Patient Stated Goal: Resume previous lifestyle with decreased pain PT Goal Formulation: With patient Time For Goal Achievement: 03/29/15 Potential to Achieve Goals: Good Progress towards PT goals: Progressing toward goals    Frequency  7X/week    PT Plan Current plan remains appropriate    Co-evaluation             End of Session Equipment Utilized During Treatment: Gait belt Activity Tolerance: Patient tolerated treatment well Patient left: in chair;with call bell/phone within reach     Time: 0818-0847 PT Time Calculation (min) (ACUTE ONLY): 29 min  Charges:  $Gait Training: 8-22 mins $Therapeutic Exercise: 8-22 mins                    G Codes:      Tyannah Sane Apr 22, 2015, 12:23 PM

## 2015-03-24 NOTE — Progress Notes (Signed)
Patient in chair eating breakfast with no complaints this morning.  States that she had a restless night last night, but pain level is better this morning.   Family in room with patient.

## 2015-03-24 NOTE — Progress Notes (Signed)
   Subjective: 1 Day Post-Op Procedure(s) (LRB): TOTAL HIP ARTHROPLASTY ANTERIOR APPROACH (Left) Patient reports pain as moderate.  She is not having any pain in her hip. Her pain is in her lower back We will continue therapy today.  Plan is to go Home after hospital stay.  Objective: Vital signs in last 24 hours: Temp:  [97.4 F (36.3 C)-98.1 F (36.7 C)] 98 F (36.7 C) (12/10 0626) Pulse Rate:  [57-90] 79 (12/10 0626) Resp:  [10-25] 16 (12/10 0626) BP: (98-146)/(47-93) 144/93 mmHg (12/10 0626) SpO2:  [94 %-100 %] 96 % (12/10 0626) Weight:  [67.132 kg (148 lb)-67.331 kg (148 lb 7 oz)] 67.132 kg (148 lb) (12/09 1350)  Intake/Output from previous day:  Intake/Output Summary (Last 24 hours) at 03/24/15 0734 Last data filed at 03/24/15 0700  Gross per 24 hour  Intake 3658.75 ml  Output   3060 ml  Net 598.75 ml    Intake/Output this shift:    Labs:  Recent Labs  03/24/15 0546  HGB 9.2*    Recent Labs  03/24/15 0546  WBC 12.3*  RBC 2.95*  HCT 27.3*  PLT 154    Recent Labs  03/24/15 0546  NA 135  K 4.2  CL 104  CO2 24  BUN 20  CREATININE 0.66  GLUCOSE 165*  CALCIUM 8.5*   No results for input(s): LABPT, INR in the last 72 hours.  EXAM General - Patient is Alert, Appropriate and Oriented Extremity - Neurologically intact Neurovascular intact No cellulitis present Compartment soft Dressing - dressing C/D/I Motor Function - intact, moving foot and toes well on exam.  Hemovac pulled without difficulty.  Past Medical History  Diagnosis Date  . Hypertension   . Hyperlipidemia   . Aortic valve disorders   . Unspecified hereditary and idiopathic peripheral neuropathy   . Other abnormal glucose   . Unspecified hearing loss   . Unspecified chronic bronchitis ([place])   . Unspecified hypothyroidism   . Unspecified gastritis and gastroduodenitis without mention of hemorrhage   . Diverticulosis of colon (without mention of hemorrhage)   . Irritable bowel  syndrome   . Osteoarthrosis, unspecified whether generalized or localized, unspecified site   . Backache, unspecified   . Pain in limb   . Osteoporosis, unspecified   . Herpes zoster with other nervous system complications(053.19)   . Anxiety state, unspecified   . Anemia, unspecified   . Bradycardia     s/p PPM  . Complication of anesthesia     has had difficulty awakening in past; pt states did well with the anesthesia given during her carotid surgery   . TIA (transient ischemic attack)   . Heart murmur   . History of shingles   . Presence of permanent cardiac pacemaker   . Peripheral vascular disease ([place])   . Hemorrhoids   . Varicose veins   . Urinary incontinence   . Bursitis   . Menopause   . History of frequent urinary tract infections   . History of measles as a child   . History of mumps as a child     Assessment/Plan: 1 Day Post-Op Procedure(s) (LRB): TOTAL HIP ARTHROPLASTY ANTERIOR APPROACH (Left) Principal Problem:   OA (osteoarthritis) of hip   Advance diet Up with therapy D/C IV fluids Plan for discharge tomorrow Discharge home with home health  DVT Prophylaxis - Xarelto Weight Bearing As Tolerated left Leg Hemovac Pulled Begin Therapy    Kayla Davis

## 2015-03-24 NOTE — Evaluation (Signed)
Occupational Therapy Evaluation Patient Details Name: Kayla Davis MRN: [id number] DOB: March 18, 1923 Today's Date: 03/24/2015    History of Present Illness L THR; hx of pacemaker, TIA and PVD   Clinical Impression   Patient presenting with decreased ADL and functional mobility independence. Patient overall independent to mod I PTA. Patient currently functioning at an overall min to mod assist level. Patient will benefit from acute OT to increase overall independence in the areas of ADLs, functional mobility, and overall safety in order to safely discharge home with assistance from niece and HHOT.     Follow Up Recommendations  Home health OT;Supervision/Assistance - 24 hour    Equipment Recommendations  None recommended by OT    Recommendations for Other Services  None at this time   Precautions / Restrictions Precautions Precautions: Fall Restrictions Weight Bearing Restrictions: Yes LLE Weight Bearing: Weight bearing as tolerated    Mobility Bed Mobility General bed mobility comments: Pt received seated in recliner upon OT entering/exiting room  Transfers Overall transfer level: Needs assistance Equipment used: Rolling walker (2 wheeled) Transfers: Sit to/from Stand Sit to Stand: Min guard General transfer comment: Min guard for safety. Cues for hand placement, safety with RW, and technique.     Balance Overall balance assessment: Needs assistance Sitting-balance support: No upper extremity supported;Feet supported Sitting balance-Leahy Scale: Good     Standing balance support: Bilateral upper extremity supported;During functional activity Standing balance-Leahy Scale: Fair    ADL Overall ADL's : Needs assistance/impaired Eating/Feeding: Set up;Sitting   Grooming: Set up;Sitting   Upper Body Bathing: Set up;Sitting   Lower Body Bathing: Moderate assistance;Sit to/from stand   Upper Body Dressing : Set up;Sitting   Lower Body Dressing: Moderate  assistance;Cueing for safety;Sit to/from stand   Toilet Transfer: Min guard;RW;BSC;Ambulation   Toileting- Water quality scientist and Hygiene: Supervision/safety;Sitting/lateral Chartered certified accountant Details (indicate cue type and reason): pt has tub/shower combo with tub transfer bench at home, pt reports she was using this PTA Functional mobility during ADLs: Supervision/safety;Rolling walker;Cueing for safety General ADL Comments: Pt with difficult time reaching BLEs for LB ADLs. Pt is alone during the day while her son works. Pt states her niece will be there with her until she reaches a safe independent level. Recommending HHOT.     Vision Vision Assessment?: No apparent visual deficits          Pertinent Vitals/Pain Pain Assessment: Faces Faces Pain Scale: Hurts even more Pain Location: left hip Pain Descriptors / Indicators: Aching;Sore;Guarding Pain Intervention(s): Monitored during session;Repositioned;Ice applied     Hand Dominance Right   Extremity/Trunk Assessment Upper Extremity Assessment Upper Extremity Assessment: Generalized weakness   Lower Extremity Assessment Lower Extremity Assessment: Defer to PT evaluation   Cervical / Trunk Assessment Cervical / Trunk Assessment: Kyphotic   Communication Communication Communication: No difficulties;HOH   Cognition Arousal/Alertness: Awake/alert Behavior During Therapy: WFL for tasks assessed/performed Overall Cognitive Status: Within Functional Limits for tasks assessed             Home Living Family/patient expects to be discharged to:: Private residence Living Arrangements: Alone Available Help at Discharge: Family;Available 24 hours/day (pt states niece will be staying with her) Type of Home: House Home Access: Ramped entrance     Home Layout: One level     Bathroom Shower/Tub: Tub/shower unit;Curtain Shower/tub characteristics: Architectural technologist: Standard Bathroom Accessibility: Yes    Home Equipment: Transport chair;Grab bars - toilet;Grab bars - tub/shower;Walker - 2 wheels;Bedside commode;Tub bench  Prior Functioning/Environment Level of Independence: Independent with assistive device(s)  Comments: pt with difficulty performing LB ADLs    OT Diagnosis: Generalized weakness;Acute pain   OT Problem List: Decreased strength;Decreased range of motion;Decreased activity tolerance;Impaired balance (sitting and/or standing);Decreased safety awareness;Decreased knowledge of use of DME or AE;Pain   OT Treatment/Interventions: Self-care/ADL training;Therapeutic exercise;Energy conservation;DME and/or AE instruction;Therapeutic activities;Balance training;Patient/family education    OT Goals(Current goals can be found in the care plan section) Acute Rehab OT Goals Patient Stated Goal: go home soon OT Goal Formulation: With patient/family Time For Goal Achievement: 04/07/15 Potential to Achieve Goals: Good ADL Goals Pt Will Perform Grooming: with modified independence;standing Pt Will Perform Lower Body Bathing: sit to/from stand;with adaptive equipment;with supervision Pt Will Perform Lower Body Dressing: with supervision;sit to/from stand;with adaptive equipment Pt Will Transfer to Toilet: with modified independence;ambulating;bedside commode Additional ADL Goal #1: Pt will be mod I with functional mobility using RW  OT Frequency: Min 2X/week   Barriers to D/C: none known at this time   End of Session Equipment Utilized During Treatment: Rolling walker  Activity Tolerance: Patient tolerated treatment well Patient left: in chair;with call bell/phone within reach;with family/visitor present;with chair alarm set   Time: 1352-1410 OT Time Calculation (min): 18 min Charges:  OT General Charges $OT Visit: 1 Procedure OT Evaluation $Initial OT Evaluation Tier I: 1 Procedure  Deveney Bayon , MS, OTR/L, CLT Pager: [phone]  03/24/2015, 2:45 PM

## 2015-03-24 NOTE — Progress Notes (Signed)
Physical Therapy Treatment Patient Details Name: Kayla Davis MRN: [id number] DOB: 04-19-1922 Today's Date: 03/24/2015    History of Present Illness L THR; hx of pacemaker, TIA and PVD    PT Comments    Steady progress with mobility.  Pt hopeful for dc tomorrow.  Follow Up Recommendations  Home health PT     Equipment Recommendations  None recommended by PT    Recommendations for Other Services OT consult     Precautions / Restrictions Precautions Precautions: Fall Restrictions Weight Bearing Restrictions: No LLE Weight Bearing: Weight bearing as tolerated    Mobility  Bed Mobility Overal bed mobility: Needs Assistance Bed Mobility: Sit to Supine       Sit to supine: Min assist   General bed mobility comments: Cues for sequence and use of R LE to self assist  Transfers Overall transfer level: Needs assistance Equipment used: Rolling walker (2 wheeled) Transfers: Sit to/from Stand Sit to Stand: Min guard         General transfer comment: Min guard for safety. Cues for hand placement, safety with RW, and technique.   Ambulation/Gait Ambulation/Gait assistance: Min assist;Min guard Ambulation Distance (Feet): 123 Feet Assistive device: Rolling walker (2 wheeled) Gait Pattern/deviations: Step-to pattern;Decreased step length - right;Decreased step length - left;Shuffle;Antalgic;Trunk flexed Gait velocity: decre   General Gait Details: cues for sequence, posture and position from [employer] Mobility    Modified Rankin (Stroke Patients Only)       Balance Overall balance assessment: Needs assistance Sitting-balance support: No upper extremity supported;Feet supported Sitting balance-Leahy Scale: Good     Standing balance support: Bilateral upper extremity supported;During functional activity Standing balance-Leahy Scale: Fair                      Cognition Arousal/Alertness: Awake/alert Behavior  During Therapy: WFL for tasks assessed/performed Overall Cognitive Status: Within Functional Limits for tasks assessed                      Exercises      General Comments        Pertinent Vitals/Pain Pain Assessment: 0-10 Pain Score: 7  Faces Pain Scale: Hurts even more Pain Location: L hip Pain Descriptors / Indicators: Aching;Sore Pain Intervention(s): Limited activity within patient's tolerance;Monitored during session;Ice applied    Home Living Family/patient expects to be discharged to:: Private residence Living Arrangements: Alone Available Help at Discharge: Family;Available 24 hours/day (pt states niece will be staying with her) Type of Home: House Home Access: Ramped entrance   Home Layout: One level Home Equipment: Transport chair;Grab bars - toilet;Grab bars - tub/shower;Walker - 2 wheels;Bedside commode;Tub bench      Prior Function Level of Independence: Independent with assistive device(s)      Comments: pt with difficulty performing LB ADLs   PT Goals (current goals can now be found in the care plan section) Acute Rehab PT Goals Patient Stated Goal: go home soon PT Goal Formulation: With patient Time For Goal Achievement: 03/29/15 Potential to Achieve Goals: Good Progress towards PT goals: Progressing toward goals    Frequency  7X/week    PT Plan Current plan remains appropriate    Co-evaluation             End of Session Equipment Utilized During Treatment: Gait belt Activity Tolerance: Patient tolerated treatment well Patient left: in bed;with call bell/phone within reach;with  family/visitor present     Time: [id number] PT Time Calculation (min) (ACUTE ONLY): 28 min  Charges:  $Gait Training: 23-37 mins                    G Codes:      Michela Herst 06-Apr-2015, 5:29 PM

## 2015-03-24 NOTE — Progress Notes (Signed)
Patient continues to receive prophylactic analgesic therapy with scheduled dosing of Acetaminophen. Pain is localizable to the operative left lower extremity. Patient was encouraged routine use of the Incentive Spirometer device. Patient was instructed to use the device every  hour while awake at 10 breaths per session and coached to optimal performance. Subsequently, patient demonstrated proficiency in the use of the device.

## 2015-03-24 NOTE — Discharge Instructions (Addendum)
° °Dr. Deshanta Lady °Total Joint Specialist °[facility] °[address] °[place], [place] [address] °[phone] ° °ANTERIOR APPROACH TOTAL HIP REPLACEMENT POSTOPERATIVE DIRECTIONS ° ° °Hip Rehabilitation, Guidelines Following Surgery  °The results of a hip operation are greatly improved after range of motion and muscle strengthening exercises. Follow all safety measures which are given to protect your hip. If any of these exercises cause increased pain or swelling in your joint, decrease the amount until you are comfortable again. Then slowly increase the exercises. Call your caregiver if you have problems or questions.  ° °HOME CARE INSTRUCTIONS  °Remove items at home which could result in a fall. This includes throw rugs or furniture in walking pathways.  °· ICE to the affected hip every three hours for 30 minutes at a time and then as needed for pain and swelling.  Continue to use ice on the hip for pain and swelling from surgery. You may notice swelling that will progress down to the foot and ankle.  This is normal after surgery.  Elevate the leg when you are not up walking on it.   °· Continue to use the breathing machine which will help keep your temperature down.  It is common for your temperature to cycle up and down following surgery, especially at night when you are not up moving around and exerting yourself.  The breathing machine keeps your lungs expanded and your temperature down. ° ° °DIET °You may resume your previous home diet once your are discharged from the hospital. ° °DRESSING / WOUND CARE / SHOWERING °You may start showering once you are discharged home but do not submerge the incision under water. Just pat the incision dry and apply a dry gauze dressing on daily. °Change the surgical dressing daily and reapply a dry dressing each time. ° °ACTIVITY °Walk with your walker as instructed. °Use walker as long as suggested by your caregivers. °Avoid periods of inactivity  such as sitting longer than an hour when not asleep. This helps prevent blood clots.  °You may resume a sexual relationship in one month or when given the OK by your doctor.  °You may return to work once you are cleared by your doctor.  °Do not drive a car for 6 weeks or until released by you surgeon.  °Do not drive while taking narcotics. ° °WEIGHT BEARING °Weight bearing as tolerated with assist device (walker, cane, etc) as directed, use it as long as suggested by your surgeon or therapist, typically at least 4-6 weeks. ° °POSTOPERATIVE CONSTIPATION PROTOCOL °Constipation - defined medically as fewer than three stools per week and severe constipation as less than one stool per week. ° °One of the most common issues patients have following surgery is constipation.  Even if you have a regular bowel pattern at home, your normal regimen is likely to be disrupted due to multiple reasons following surgery.  Combination of anesthesia, postoperative narcotics, change in appetite and fluid intake all can affect your bowels.  In order to avoid complications following surgery, here are some recommendations in order to help you during your recovery period. ° °Colace (docusate) - Pick up an over-the-counter form of Colace or another stool softener and take twice a day as long as you are requiring postoperative pain medications.  Take with a full glass of water daily.  If you experience loose stools or diarrhea, hold the colace until you stool forms back up.  If your symptoms do not get better within 1   week or if they get worse, check with your doctor. ° °Dulcolax (bisacodyl) - Pick up over-the-counter and take as directed by the product packaging as needed to assist with the movement of your bowels.  Take with a full glass of water.  Use this product as needed if not relieved by Colace only.  ° °MiraLax (polyethylene glycol) - Pick up over-the-counter to have on hand.  MiraLax is a solution that will increase the amount of  water in your bowels to assist with bowel movements.  Take as directed and can mix with a glass of water, juice, soda, coffee, or tea.  Take if you go more than two days without a movement. °Do not use MiraLax more than once per day. Call your doctor if you are still constipated or irregular after using this medication for 7 days in a row. ° °If you continue to have problems with postoperative constipation, please contact the office for further assistance and recommendations.  If you experience "the worst abdominal pain ever" or develop nausea or vomiting, please contact the office immediatly for further recommendations for treatment. ° °ITCHING ° If you experience itching with your medications, try taking only a single pain pill, or even half a pain pill at a time.  You can also use Benadryl over the counter for itching or also to help with sleep.  ° °TED HOSE STOCKINGS °Wear the elastic stockings on both legs for three weeks following surgery during the day but you may remove then at night for sleeping. ° °MEDICATIONS °See your medication summary on the “After Visit Summary” that the nursing staff will review with you prior to discharge.  You may have some home medications which will be placed on hold until you complete the course of blood thinner medication.  It is important for you to complete the blood thinner medication as prescribed by your surgeon.  Continue your approved medications as instructed at time of discharge. ° °PRECAUTIONS °If you experience chest pain or shortness of breath - call 911 immediately for transfer to the hospital emergency department.  °If you develop a fever greater that 101 F, purulent drainage from wound, increased redness or drainage from wound, foul odor from the wound/dressing, or calf pain - CONTACT YOUR SURGEON.   °                                                °FOLLOW-UP APPOINTMENTS °Make sure you keep all of your appointments after your operation with your surgeon and  caregivers. You should call the office at the above phone number and make an appointment for approximately two weeks after the date of your surgery or on the date instructed by your surgeon outlined in the "After Visit Summary". ° °RANGE OF MOTION AND STRENGTHENING EXERCISES  °These exercises are designed to help you keep full movement of your hip joint. Follow your caregiver's or physical therapist's instructions. Perform all exercises about fifteen times, three times per day or as directed. Exercise both hips, even if you have had only one joint replacement. These exercises can be done on a training (exercise) mat, on the floor, on a table or on a bed. Use whatever works the best and is most comfortable for you. Use music or television while you are exercising so that the exercises are a pleasant break in your day. This   will make your life better with the exercises acting as a break in routine you can look forward to.  °Lying on your back, slowly slide your foot toward your buttocks, raising your knee up off the floor. Then slowly slide your foot back down until your leg is straight again.  °Lying on your back spread your legs as far apart as you can without causing discomfort.  °Lying on your side, raise your upper leg and foot straight up from the floor as far as is comfortable. Slowly lower the leg and repeat.  °Lying on your back, tighten up the muscle in the front of your thigh (quadriceps muscles). You can do this by keeping your leg straight and trying to raise your heel off the floor. This helps strengthen the largest muscle supporting your knee.  °Lying on your back, tighten up the muscles of your buttocks both with the legs straight and with the knee bent at a comfortable angle while keeping your heel on the floor.  ° °IF YOU ARE TRANSFERRED TO A SKILLED REHAB FACILITY °If the patient is transferred to a skilled rehab facility following release from the hospital, a list of the current medications will be  sent to the facility for the patient to continue.  When discharged from the skilled rehab facility, please have the facility set up the patient's Home Health Physical Therapy prior to being released. Also, the skilled facility will be responsible for providing the patient with their medications at time of release from the facility to include their pain medication, the muscle relaxants, and their blood thinner medication. If the patient is still at the rehab facility at time of the two week follow up appointment, the skilled rehab facility will also need to assist the patient in arranging follow up appointment in our office and any transportation needs. ° °MAKE SURE YOU:  °Understand these instructions.  °Get help right away if you are not doing well or get worse.  ° ° °Pick up stool softner and laxative for home use following surgery while on pain medications. °Do not submerge incision under water. °Please use good hand washing techniques while changing dressing each day. °May shower starting three days after surgery. °Please use a clean towel to pat the incision dry following showers. °Continue to use ice for pain and swelling after surgery. °Do not use any lotions or creams on the incision until instructed by your surgeon. ° °Information on my medicine - XARELTO® (Rivaroxaban) ° °This medication education was reviewed with me or my healthcare representative as part of my discharge preparation.  The pharmacist that spoke with me during my hospital stay was:  WOFFORD, DREW A, RPH ° °Why was Xarelto® prescribed for you? °Xarelto® was prescribed for you to reduce the risk of blood clots forming after orthopedic surgery. The medical term for these abnormal blood clots is venous thromboembolism (VTE). ° °What do you need to know about xarelto® ? °Take your Xarelto® ONCE DAILY at the same time every day. °You may take it either with or without food. ° °If you have difficulty swallowing the tablet whole, you may crush it  and mix in applesauce just prior to taking your dose. ° °Take Xarelto® exactly as prescribed by your doctor and DO NOT stop taking Xarelto® without talking to the doctor who prescribed the medication.  Stopping without other VTE prevention medication to take the place of Xarelto® may increase your risk of developing a clot. ° °After discharge, you should have regular   check-up appointments with your healthcare provider that is prescribing your Xarelto®.   ° °What do you do if you miss a dose? °If you miss a dose, take it as soon as you remember on the same day then continue your regularly scheduled once daily regimen the next day. Do not take two doses of Xarelto® on the same day.  ° °Important Safety Information °A possible side effect of Xarelto® is bleeding. You should call your healthcare provider right away if you experience any of the following: °? Bleeding from an injury or your nose that does not stop. °? Unusual colored urine (red or dark brown) or unusual colored stools (red or black). °? Unusual bruising for unknown reasons. °? A serious fall or if you hit your head (even if there is no bleeding). ° °Some medicines may interact with Xarelto® and might increase your risk of bleeding while on Xarelto®. To help avoid this, consult your healthcare provider or pharmacist prior to using any new prescription or non-prescription medications, including herbals, vitamins, non-steroidal anti-inflammatory drugs (NSAIDs) and supplements. ° °This website has more information on Xarelto®: www.xarelto.com. ° ° ° °

## 2015-03-25 LAB — BASIC METABOLIC PANEL
Anion gap: 8 (ref 5–15)
BUN: 24 mg/dL — AB (ref 6–20)
CHLORIDE: 105 mmol/L (ref 101–111)
CO2: 26 mmol/L (ref 22–32)
Calcium: 8.5 mg/dL — ABNORMAL LOW (ref 8.9–10.3)
Creatinine, Ser: 0.68 mg/dL (ref 0.44–1.00)
GFR calc Af Amer: >60 mL/min (ref >60)
GFR calc non Af Amer: >60 mL/min (ref >60)
Glucose, Bld: 178 mg/dL — ABNORMAL HIGH (ref 65–99)
POTASSIUM: 4 mmol/L (ref 3.5–5.1)
SODIUM: 139 mmol/L (ref 135–145)

## 2015-03-25 LAB — CBC
HCT: 26.4 % — ABNORMAL LOW (ref 36.0–46.0)
HEMOGLOBIN: 8.9 g/dL — AB (ref 12.0–15.0)
MCH: 30.9 pg (ref 26.0–34.0)
MCHC: 33.7 g/dL (ref 30.0–36.0)
MCV: 91.7 fL (ref 78.0–100.0)
Platelets: 141 10*3/uL — ABNORMAL LOW (ref 150–400)
RBC: 2.88 MIL/uL — AB (ref 3.87–5.11)
RDW: 12.8 % (ref 11.5–15.5)
WBC: 9.6 10*3/uL (ref 4.0–10.5)

## 2015-03-25 MED ORDER — LOSARTAN POTASSIUM-HCTZ 50-12.5 MG PO TABS: 0.5000 | ORAL_TABLET | Freq: Every day | ORAL | Status: DC

## 2015-03-25 MED ORDER — IPRATROPIUM BROMIDE 0.06 % NA SOLN: 2.0000 | Freq: Two times a day (BID) | NASAL | Status: DC

## 2015-03-25 MED ORDER — METHOCARBAMOL 500 MG PO TABS: 500.0000 mg | ORAL_TABLET | Freq: Four times a day (QID) | ORAL | Status: DC | PRN

## 2015-03-25 MED ORDER — SIMVASTATIN 40 MG PO TABS: 40.0000 mg | ORAL_TABLET | Freq: Every day | ORAL | Status: DC

## 2015-03-25 MED ORDER — RIVAROXABAN 10 MG PO TABS: 10.0000 mg | ORAL_TABLET | Freq: Every day | ORAL | Status: DC

## 2015-03-25 NOTE — Progress Notes (Deleted)
Patient ID: Kayla Davis, female   DOB: [birth year], 81 y.o.   MRN: [id number]    Subjective: 2 Days Post-Op Procedure(s) (LRB): TOTAL HIP ARTHROPLASTY ANTERIOR APPROACH (Left) Patient reports pain as 4 on 0-10 scale.   Denies CP or SOB.  Voiding without difficulty. Positive BM Objective: Vital signs in last 24 hours: Temp:  [97.6 F (36.4 C)-98 F (36.7 C)] 97.7 F (36.5 C) (12/11 0654) Pulse Rate:  [61-65] 62 (12/11 0654) Resp:  [14-16] 14 (12/11 0654) BP: (137-145)/(46-52) 145/46 mmHg (12/11 0654) SpO2:  [93 %-99 %] 93 % (12/11 0654)  Intake/Output from previous day: 12/10 0701 - 12/11 0700 In: 957.7 [P.O.:840; I.V.:117.7] Out: 500 [Urine:500] Intake/Output this shift:    Labs:  Recent Labs  03/24/15 0546 03/25/15 0524  HGB 9.2* 8.9*    Recent Labs  03/24/15 0546 03/25/15 0524  WBC 12.3* 9.6  RBC 2.95* 2.88*  HCT 27.3* 26.4*  PLT 154 141*    Recent Labs  03/24/15 0546 03/25/15 0524  NA 135 139  K 4.2 4.0  CL 104 105  CO2 24 26  BUN 20 24*  CREATININE 0.66 0.68  GLUCOSE 165* 178*  CALCIUM 8.5* 8.5*   No results for input(s): LABPT, INR in the last 72 hours.  Physical Exam: Neurologically intact ABD soft Sensation intact distally Incision: no drainage Compartment soft  Assessment/Plan: 2 Days Post-Op Procedure(s) (LRB): TOTAL HIP ARTHROPLASTY ANTERIOR APPROACH (Left) Advance diet Up with therapy  If cleared by PT consider DC home today  Yechezkel Fertig, Darla Lesches for Dr. Melina Schools [facility] [phone] 03/25/2015, 8:38 AM

## 2015-03-25 NOTE — Progress Notes (Signed)
Patient ID: Kayla Davis, female   DOB: Sep 13, 1922, 81 y.o.   MRN: [id number]    Subjective: 2 Days Post-Op Procedure(s) (LRB): TOTAL HIP ARTHROPLASTY ANTERIOR APPROACH (Left) Patient reports pain as 2 on 0-10 scale.   Denies CP or SOB.  Voiding without difficulty. Positive BM Objective: Vital signs in last 24 hours: Temp:  [97.6 F (36.4 C)-98 F (36.7 C)] 97.7 F (36.5 C) (12/11 0654) Pulse Rate:  [61-65] 62 (12/11 0654) Resp:  [14-16] 14 (12/11 0654) BP: (137-145)/(46-52) 145/46 mmHg (12/11 0654) SpO2:  [93 %-99 %] 93 % (12/11 0654)  Intake/Output from previous day: 12/10 0701 - 12/11 0700 In: 957.7 [P.O.:840; I.V.:117.7] Out: 500 [Urine:500] Intake/Output this shift:    Labs:  Recent Labs  03/24/15 0546 03/25/15 0524  HGB 9.2* 8.9*    Recent Labs  03/24/15 0546 03/25/15 0524  WBC 12.3* 9.6  RBC 2.95* 2.88*  HCT 27.3* 26.4*  PLT 154 141*    Recent Labs  03/24/15 0546 03/25/15 0524  NA 135 139  K 4.2 4.0  CL 104 105  CO2 24 26  BUN 20 24*  CREATININE 0.66 0.68  GLUCOSE 165* 178*  CALCIUM 8.5* 8.5*   No results for input(s): LABPT, INR in the last 72 hours.  Physical Exam: Neurologically intact ABD soft Sensation intact distally Incision: scant drainage Compartment soft  Assessment/Plan: 2 Days Post-Op Procedure(s) (LRB): TOTAL HIP ARTHROPLASTY ANTERIOR APPROACH (Left) Up with therapy Discharge home with home health  Kylin Dubs, Darla Lesches for Dr. Melina Schools [facility] [phone] 03/25/2015, 8:30 AM

## 2015-03-25 NOTE — Progress Notes (Signed)
Discharged from floor via w/c, belongings & family with pt. No changes in assessment. Kayla Davis  

## 2015-03-25 NOTE — Progress Notes (Signed)
Physical Therapy Treatment Patient Details Name: Kayla Davis MRN: [id number] DOB: [birth year] Today's Date: 03/25/2015    History of Present Illness L THR; hx of pacemaker, TIA and PVD    PT Comments    Pt motivated and eager for dc home.  Reviewed therex, stairs and car transfers with pt and family.  Follow Up Recommendations  Home health PT     Equipment Recommendations  None recommended by PT    Recommendations for Other Services OT consult     Precautions / Restrictions Precautions Precautions: Fall Restrictions Weight Bearing Restrictions: No LLE Weight Bearing: Weight bearing as tolerated Other Position/Activity Restrictions: WBAT    Mobility  Bed Mobility Overal bed mobility: Needs Assistance Bed Mobility: Supine to Sit;Sit to Supine     Supine to sit: Min assist Sit to supine: Min assist   General bed mobility comments: Cues for sequence and use of R LE to self assist  Transfers Overall transfer level: Needs assistance Equipment used: Rolling walker (2 wheeled) Transfers: Sit to/from Stand Sit to Stand: Min guard         General transfer comment: Min guard for safety. Cues for hand placement, safety with RW, and technique.   Ambulation/Gait Ambulation/Gait assistance: Min guard;Supervision Ambulation Distance (Feet): 150 Feet Assistive device: Rolling walker (2 wheeled) Gait Pattern/deviations: Step-to pattern;Step-through pattern;Decreased step length - right;Decreased step length - left;Shuffle;Trunk flexed;Antalgic     General Gait Details: cues for sequence, posture and position from RW   Stairs Stairs: Yes Stairs assistance: Min assist Stair Management: Two rails;Step to pattern;Forwards Number of Stairs: 5 General stair comments: cues for sequence and foot placement  Wheelchair Mobility    Modified Rankin (Stroke Patients Only)       Balance     Sitting balance-Leahy Scale: Good       Standing balance-Leahy Scale:  Fair                      Cognition Arousal/Alertness: Awake/alert Behavior During Therapy: WFL for tasks assessed/performed Overall Cognitive Status: Within Functional Limits for tasks assessed                      Exercises Total Joint Exercises Ankle Circles/Pumps: AROM;Both;15 reps;Supine Quad Sets: AROM;Both;10 reps;Supine Heel Slides: AAROM;Left;Supine;20 reps Hip ABduction/ADduction: AAROM;Left;Supine;15 reps    General Comments        Pertinent Vitals/Pain Pain Assessment: 0-10 Pain Score: 3  Pain Location: L hip Pain Descriptors / Indicators: Aching;Sore Pain Intervention(s): Limited activity within patient's tolerance;Monitored during session;Premedicated before session    Home Living                      Prior Function            PT Goals (current goals can now be found in the care plan section) Acute Rehab PT Goals Patient Stated Goal: go home soon PT Goal Formulation: With patient Time For Goal Achievement: 03/29/15 Potential to Achieve Goals: Good Progress towards PT goals: Progressing toward goals    Frequency  7X/week    PT Plan Current plan remains appropriate    Co-evaluation             End of Session Equipment Utilized During Treatment: Gait belt Activity Tolerance: Patient tolerated treatment well Patient left: in chair;with call bell/phone within reach;with family/visitor present     Time: [id number] PT Time Calculation (min) (ACUTE ONLY): 32 min  Charges:  $Gait Training:  8-22 mins $Therapeutic Exercise: 8-22 mins                    G Codes:      Mairyn Lenahan 03/31/2015, 1:24 PM

## 2015-03-26 ENCOUNTER — Telehealth: Payer: Self-pay | Admitting: Internal Medicine

## 2015-03-26 DIAGNOSIS — M4310 Spondylolisthesis, site unspecified: Secondary | ICD-10-CM | POA: Diagnosis not present

## 2015-03-26 DIAGNOSIS — Z471 Aftercare following joint replacement surgery: Secondary | ICD-10-CM | POA: Diagnosis not present

## 2015-03-26 DIAGNOSIS — E1129 Type 2 diabetes mellitus with other diabetic kidney complication: Secondary | ICD-10-CM | POA: Diagnosis not present

## 2015-03-26 DIAGNOSIS — M47816 Spondylosis without myelopathy or radiculopathy, lumbar region: Secondary | ICD-10-CM | POA: Diagnosis not present

## 2015-03-26 DIAGNOSIS — G8929 Other chronic pain: Secondary | ICD-10-CM | POA: Diagnosis not present

## 2015-03-26 DIAGNOSIS — G629 Polyneuropathy, unspecified: Secondary | ICD-10-CM | POA: Diagnosis not present

## 2015-03-26 LAB — TYPE AND SCREEN
ABO/RH(D): A POS
Antibody Screen: NEGATIVE

## 2015-03-26 NOTE — Telephone Encounter (Signed)
Can you please call pt regarding her blood pressure medication. She recently had a hip replacement and they took her off it and they wanted her to check with you on what her blood pressure should read before she starts back on.

## 2015-03-26 NOTE — Discharge Summary (Signed)
Physician Discharge Summary  Patient ID: Kayla Davis MRN: [id number] DOB/AGE: 06/20/1922 81 y.o.  Admit date: 03/23/2015 Discharge date: 03/26/2015  Admission Diagnoses:  OA (osteoarthritis) of hip  Discharge Diagnoses:  Principal Problem:   OA (osteoarthritis) of hip   Past Medical History  Diagnosis Date  . Hypertension   . Hyperlipidemia   . Aortic valve disorders   . Unspecified hereditary and idiopathic peripheral neuropathy   . Other abnormal glucose   . Unspecified hearing loss   . Unspecified chronic bronchitis ([place])   . Unspecified hypothyroidism   . Unspecified gastritis and gastroduodenitis without mention of hemorrhage   . Diverticulosis of colon (without mention of hemorrhage)   . Irritable bowel syndrome   . Osteoarthrosis, unspecified whether generalized or localized, unspecified site   . Backache, unspecified   . Pain in limb   . Osteoporosis, unspecified   . Herpes zoster with other nervous system complications(053.19)   . Anxiety state, unspecified   . Anemia, unspecified   . Bradycardia     s/p PPM  . Complication of anesthesia     has had difficulty awakening in past; pt states did well with the anesthesia given during her carotid surgery   . TIA (transient ischemic attack)   . Heart murmur   . History of shingles   . Presence of permanent cardiac pacemaker   . Peripheral vascular disease ([place])   . Hemorrhoids   . Varicose veins   . Urinary incontinence   . Bursitis   . Menopause   . History of frequent urinary tract infections   . History of measles as a child   . History of mumps as a child     Surgeries: Procedure(s): TOTAL HIP ARTHROPLASTY ANTERIOR APPROACH on 03/23/2015   Consultants (if any):    Discharged Condition: Improved  Hospital Course: Kayla Davis is an 81 y.o. female who was admitted 03/23/2015 with a diagnosis of OA (osteoarthritis) of hip and went to the operating room on 03/23/2015 and underwent the above named  procedures.  Pt discharged on 03/25/15.     She was given perioperative antibiotics:  Anti-infectives    Start     Dose/Rate Route Frequency Ordered Stop   03/23/15 2130  vancomycin (VANCOCIN) IVPB 1000 mg/200 mL premix     1,000 mg 200 mL/hr over 60 Minutes Intravenous Every 12 hours 03/23/15 1410 03/23/15 2250   03/23/15 0756  vancomycin (VANCOCIN) IVPB 1000 mg/200 mL premix     1,000 mg 200 mL/hr over 60 Minutes Intravenous On call to O.R. 03/23/15 0756 03/23/15 1010    .  She was given sequential compression devices, early ambulation, and TED for DVT prophylaxis.  She benefited maximally from the hospital stay and there were no complications.    Recent vital signs:  Filed Vitals:   03/25/15 0654 03/25/15 1020  BP: 145/46 153/50  Pulse: 62 68  Temp: 97.7 F (36.5 C)   Resp: 14     Recent laboratory studies:  Lab Results  Component Value Date   HGB 8.9* 03/25/2015   HGB 9.2* 03/24/2015   HGB 11.6* 03/16/2015   Lab Results  Component Value Date   WBC 9.6 03/25/2015   PLT 141* 03/25/2015   Lab Results  Component Value Date   INR 1.06 03/16/2015   Lab Results  Component Value Date   NA 139 03/25/2015   K 4.0 03/25/2015   CL 105 03/25/2015   CO2 26 03/25/2015   BUN 24*  03/25/2015   CREATININE 0.68 03/25/2015   GLUCOSE 178* 03/25/2015    Discharge Medications:     Medication List    STOP taking these medications        aspirin 81 MG tablet     B-12 2500 MCG Tabs     CALCIUM PO     cholecalciferol 1000 UNITS tablet  Commonly known as:  VITAMIN D     meloxicam 7.5 MG tablet  Commonly known as:  MOBIC     multivitamin with minerals Tabs tablet     naproxen sodium 220 MG tablet  Commonly known as:  ANAPROX     OSTEO BI-FLEX ADV DOUBLE ST Caps     OVER THE COUNTER MEDICATION     PRESERVISION AREDS PO      TAKE these medications        glucose blood test strip  Commonly known as:  ONE TOUCH TEST STRIPS  1 each by Other route 2 (two)  times daily. Use as instructed     hydroxypropyl methylcellulose / hypromellose 2.5 % ophthalmic solution  Commonly known as:  ISOPTO TEARS / GONIOVISC  Place 1 drop into both eyes 2 (two) times daily.     ipratropium 0.06 % nasal spray  Commonly known as:  ATROVENT  Place 2 sprays into the nose 2 (two) times daily.     levothyroxine 125 MCG tablet  Commonly known as:  SYNTHROID  Take 1 tablet (125 mcg total) by mouth daily before breakfast.     loratadine 10 MG tablet  Commonly known as:  CLARITIN  Take 10 mg by mouth daily as needed for allergies.     losartan-hydrochlorothiazide 50-12.5 MG tablet  Commonly known as:  HYZAAR  Take 0.5 tablets by mouth daily.     methocarbamol 500 MG tablet  Commonly known as:  ROBAXIN  Take 1 tablet (500 mg total) by mouth every 6 (six) hours as needed for muscle spasms.     onetouch ultrasoft lancets  Use as instructed     polyethylene glycol packet  Commonly known as:  MIRALAX / GLYCOLAX  Take 17 g by mouth daily as needed.     pregabalin 75 MG capsule  Commonly known as:  LYRICA  Take 75 mg by mouth at bedtime.     rivaroxaban 10 MG Tabs tablet  Commonly known as:  XARELTO  Take 1 tablet (10 mg total) by mouth daily with breakfast.     simvastatin 40 MG tablet  Commonly known as:  ZOCOR  Take 1 tablet (40 mg total) by mouth daily with supper.     sodium chloride 0.65 % Soln nasal spray  Commonly known as:  OCEAN  Place 1 spray into both nostrils 2 (two) times daily.     sodium chloride irrigation 0.9 % irrigation  Irrigate with 1 application as directed 2 (two) times daily as needed.        Diagnostic Studies: Dg Pelvis Portable  03/23/2015  CLINICAL DATA:  Status post left total hip replacement EXAM: DG C-ARM 1-60 MIN-NO REPORT; PORTABLE PELVIS 1-2 VIEWS COMPARISON:  None. FLUOROSCOPY TIME:  0 minutes 12 seconds; 1 acquired image FINDINGS: There is a total hip prosthesis on the left with prosthetic components appearing  well-seated. No acute fracture or dislocation. There is moderate narrowing of the right hip joint. There is a drain in the left hip joint region. IMPRESSION: Total hip prosthesis on the left with prosthetic components appearing well-seated. No acute fracture or  dislocation. Moderate osteoarthritic change right hip joint. Electronically Signed   By: Lowella Grip III M.D.   On: 03/23/2015 12:29   Dg C-arm 1-60 Min-no Report  03/23/2015  CLINICAL DATA: hip C-ARM 1-60 MINUTES Fluoroscopy was utilized by the requesting physician.  No radiographic interpretation.    Disposition: 06-Home-Health Care Svc        Follow-up Information    Follow up with [facility].   Why:  Home Health Physical Therapy   Contact information:   [place]  [id number] [phone]       Follow up with [name], MD. Schedule an appointment as soon as possible for a visit on 04/06/2015.   Specialty:  Orthopedic Surgery   Why:  Call [phone] tomorrow to make the sppointment   Contact information:   [address] [place] [address] [id number]        Signed: Valinda Hoar 03/26/2015, 8:09 AM

## 2015-03-27 ENCOUNTER — Telehealth: Payer: Self-pay | Admitting: *Deleted

## 2015-03-27 NOTE — Telephone Encounter (Signed)
Per MD- advised pt to not start Losartan/HCTZ unless her BP reaches over 140/90 and to go to ER if she starts to feel bad or if her BP drops too low. Pt voiced understanding and thanked me.

## 2015-03-27 NOTE — Telephone Encounter (Signed)
I called pt- She was told to stop Losartan/HCTZ due to hypotension while in hospital. She was given Xarelto after her hip replacement taking 1 daily x 18 days. She states her BP readings have been averaging 120's/50s. She wants to know when/if she should start back Losartan/HCTZ.  Please advise.

## 2015-03-27 NOTE — Telephone Encounter (Signed)
Pt was on TCM list admitted for OA of Hip. Had a Total Hip Arthroplasty on 12/9. Pt D/C 03/26/15 will be f/u with Dr. Maureen Ralphs 12/23...Kayla Davis

## 2015-03-28 DIAGNOSIS — M4310 Spondylolisthesis, site unspecified: Secondary | ICD-10-CM | POA: Diagnosis not present

## 2015-03-28 DIAGNOSIS — G629 Polyneuropathy, unspecified: Secondary | ICD-10-CM | POA: Diagnosis not present

## 2015-03-28 DIAGNOSIS — Z471 Aftercare following joint replacement surgery: Secondary | ICD-10-CM | POA: Diagnosis not present

## 2015-03-28 DIAGNOSIS — M47816 Spondylosis without myelopathy or radiculopathy, lumbar region: Secondary | ICD-10-CM | POA: Diagnosis not present

## 2015-03-28 DIAGNOSIS — G8929 Other chronic pain: Secondary | ICD-10-CM | POA: Diagnosis not present

## 2015-03-28 DIAGNOSIS — E1129 Type 2 diabetes mellitus with other diabetic kidney complication: Secondary | ICD-10-CM | POA: Diagnosis not present

## 2015-04-02 DIAGNOSIS — G8929 Other chronic pain: Secondary | ICD-10-CM | POA: Diagnosis not present

## 2015-04-02 DIAGNOSIS — M47816 Spondylosis without myelopathy or radiculopathy, lumbar region: Secondary | ICD-10-CM | POA: Diagnosis not present

## 2015-04-02 DIAGNOSIS — M4310 Spondylolisthesis, site unspecified: Secondary | ICD-10-CM | POA: Diagnosis not present

## 2015-04-02 DIAGNOSIS — E1129 Type 2 diabetes mellitus with other diabetic kidney complication: Secondary | ICD-10-CM | POA: Diagnosis not present

## 2015-04-02 DIAGNOSIS — G629 Polyneuropathy, unspecified: Secondary | ICD-10-CM | POA: Diagnosis not present

## 2015-04-02 DIAGNOSIS — Z471 Aftercare following joint replacement surgery: Secondary | ICD-10-CM | POA: Diagnosis not present

## 2015-04-03 ENCOUNTER — Ambulatory Visit: Payer: MEDICARE | Admitting: Family

## 2015-04-03 ENCOUNTER — Encounter (HOSPITAL_COMMUNITY): Payer: MEDICARE

## 2015-04-04 DIAGNOSIS — M47816 Spondylosis without myelopathy or radiculopathy, lumbar region: Secondary | ICD-10-CM | POA: Diagnosis not present

## 2015-04-04 DIAGNOSIS — Z471 Aftercare following joint replacement surgery: Secondary | ICD-10-CM | POA: Diagnosis not present

## 2015-04-04 DIAGNOSIS — M4310 Spondylolisthesis, site unspecified: Secondary | ICD-10-CM | POA: Diagnosis not present

## 2015-04-04 DIAGNOSIS — E1129 Type 2 diabetes mellitus with other diabetic kidney complication: Secondary | ICD-10-CM | POA: Diagnosis not present

## 2015-04-04 DIAGNOSIS — G8929 Other chronic pain: Secondary | ICD-10-CM | POA: Diagnosis not present

## 2015-04-04 DIAGNOSIS — G629 Polyneuropathy, unspecified: Secondary | ICD-10-CM | POA: Diagnosis not present

## 2015-04-06 DIAGNOSIS — Z471 Aftercare following joint replacement surgery: Secondary | ICD-10-CM | POA: Diagnosis not present

## 2015-04-06 DIAGNOSIS — Z96642 Presence of left artificial hip joint: Secondary | ICD-10-CM | POA: Diagnosis not present

## 2015-04-10 DIAGNOSIS — M47816 Spondylosis without myelopathy or radiculopathy, lumbar region: Secondary | ICD-10-CM | POA: Diagnosis not present

## 2015-04-10 DIAGNOSIS — E1129 Type 2 diabetes mellitus with other diabetic kidney complication: Secondary | ICD-10-CM | POA: Diagnosis not present

## 2015-04-10 DIAGNOSIS — G629 Polyneuropathy, unspecified: Secondary | ICD-10-CM | POA: Diagnosis not present

## 2015-04-10 DIAGNOSIS — G8929 Other chronic pain: Secondary | ICD-10-CM | POA: Diagnosis not present

## 2015-04-10 DIAGNOSIS — M4310 Spondylolisthesis, site unspecified: Secondary | ICD-10-CM | POA: Diagnosis not present

## 2015-04-10 DIAGNOSIS — Z471 Aftercare following joint replacement surgery: Secondary | ICD-10-CM | POA: Diagnosis not present

## 2015-04-13 DIAGNOSIS — M47816 Spondylosis without myelopathy or radiculopathy, lumbar region: Secondary | ICD-10-CM | POA: Diagnosis not present

## 2015-04-13 DIAGNOSIS — Z471 Aftercare following joint replacement surgery: Secondary | ICD-10-CM | POA: Diagnosis not present

## 2015-04-13 DIAGNOSIS — G8929 Other chronic pain: Secondary | ICD-10-CM | POA: Diagnosis not present

## 2015-04-13 DIAGNOSIS — E1129 Type 2 diabetes mellitus with other diabetic kidney complication: Secondary | ICD-10-CM | POA: Diagnosis not present

## 2015-04-13 DIAGNOSIS — M4310 Spondylolisthesis, site unspecified: Secondary | ICD-10-CM | POA: Diagnosis not present

## 2015-04-13 DIAGNOSIS — G629 Polyneuropathy, unspecified: Secondary | ICD-10-CM | POA: Diagnosis not present

## 2015-04-18 DIAGNOSIS — G629 Polyneuropathy, unspecified: Secondary | ICD-10-CM | POA: Diagnosis not present

## 2015-04-18 DIAGNOSIS — E1129 Type 2 diabetes mellitus with other diabetic kidney complication: Secondary | ICD-10-CM | POA: Diagnosis not present

## 2015-04-18 DIAGNOSIS — G8929 Other chronic pain: Secondary | ICD-10-CM | POA: Diagnosis not present

## 2015-04-18 DIAGNOSIS — Z471 Aftercare following joint replacement surgery: Secondary | ICD-10-CM | POA: Diagnosis not present

## 2015-04-18 DIAGNOSIS — M47816 Spondylosis without myelopathy or radiculopathy, lumbar region: Secondary | ICD-10-CM | POA: Diagnosis not present

## 2015-04-18 DIAGNOSIS — M4310 Spondylolisthesis, site unspecified: Secondary | ICD-10-CM | POA: Diagnosis not present

## 2015-04-20 DIAGNOSIS — G8929 Other chronic pain: Secondary | ICD-10-CM | POA: Diagnosis not present

## 2015-04-20 DIAGNOSIS — M47816 Spondylosis without myelopathy or radiculopathy, lumbar region: Secondary | ICD-10-CM | POA: Diagnosis not present

## 2015-04-20 DIAGNOSIS — Z471 Aftercare following joint replacement surgery: Secondary | ICD-10-CM | POA: Diagnosis not present

## 2015-04-20 DIAGNOSIS — M4310 Spondylolisthesis, site unspecified: Secondary | ICD-10-CM | POA: Diagnosis not present

## 2015-04-20 DIAGNOSIS — E1129 Type 2 diabetes mellitus with other diabetic kidney complication: Secondary | ICD-10-CM | POA: Diagnosis not present

## 2015-04-20 DIAGNOSIS — G629 Polyneuropathy, unspecified: Secondary | ICD-10-CM | POA: Diagnosis not present

## 2015-05-01 DIAGNOSIS — Z96642 Presence of left artificial hip joint: Secondary | ICD-10-CM | POA: Diagnosis not present

## 2015-05-01 DIAGNOSIS — Z471 Aftercare following joint replacement surgery: Secondary | ICD-10-CM | POA: Diagnosis not present

## 2015-05-09 ENCOUNTER — Telehealth: Payer: Self-pay | Admitting: Cardiology

## 2015-05-09 ENCOUNTER — Ambulatory Visit (INDEPENDENT_AMBULATORY_CARE_PROVIDER_SITE_OTHER): Payer: MEDICARE | Admitting: *Deleted

## 2015-05-09 DIAGNOSIS — I495 Sick sinus syndrome: Secondary | ICD-10-CM | POA: Diagnosis not present

## 2015-05-09 NOTE — Telephone Encounter (Signed)
LMOVM reminding pt to send remote transmission.   

## 2015-05-10 NOTE — Progress Notes (Signed)
Remote pacemaker transmission.   

## 2015-05-23 LAB — CUP PACEART REMOTE DEVICE CHECK
Brady Statistic AP VP Percent: 0 %
Brady Statistic AS VP Percent: 0 %
Brady Statistic AS VS Percent: 89 %
Date Time Interrogation Session: 20170126005208
Implantable Lead Location: 753860
Implantable Lead Model: 4470
Implantable Lead Serial Number: 686076
Lead Channel Impedance Value: 501 Ohm
Lead Channel Pacing Threshold Amplitude: 0.5 V
Lead Channel Pacing Threshold Amplitude: 0.75 V
Lead Channel Pacing Threshold Pulse Width: 0.4 ms
Lead Channel Sensing Intrinsic Amplitude: 11.2 mV
Lead Channel Setting Pacing Amplitude: 2.5 V
Lead Channel Setting Sensing Sensitivity: 5.6 mV
MDC IDC LEAD IMPLANT DT: 20111031
MDC IDC LEAD IMPLANT DT: 20111031
MDC IDC LEAD LOCATION: 753859
MDC IDC LEAD SERIAL: 548557
MDC IDC MSMT BATTERY IMPEDANCE: 300 Ohm
MDC IDC MSMT BATTERY REMAINING LONGEVITY: 119 mo
MDC IDC MSMT BATTERY VOLTAGE: 2.78 V
MDC IDC MSMT LEADCHNL RA IMPEDANCE VALUE: 465 Ohm
MDC IDC MSMT LEADCHNL RA SENSING INTR AMPL: 1.4 mV
MDC IDC MSMT LEADCHNL RV PACING THRESHOLD PULSEWIDTH: 0.4 ms
MDC IDC SET LEADCHNL RA PACING AMPLITUDE: 2 V
MDC IDC SET LEADCHNL RV PACING PULSEWIDTH: 0.4 ms
MDC IDC STAT BRADY AP VS PERCENT: 10 %

## 2015-05-24 ENCOUNTER — Encounter: Payer: Self-pay | Admitting: Family

## 2015-05-25 ENCOUNTER — Encounter: Payer: Self-pay | Admitting: Cardiology

## 2015-05-31 ENCOUNTER — Encounter: Payer: Self-pay | Admitting: Family

## 2015-05-31 ENCOUNTER — Ambulatory Visit (INDEPENDENT_AMBULATORY_CARE_PROVIDER_SITE_OTHER): Payer: MEDICARE | Admitting: Family

## 2015-05-31 ENCOUNTER — Ambulatory Visit (HOSPITAL_COMMUNITY)
Admission: RE | Admit: 2015-05-31 | Discharge: 2015-05-31 | Disposition: A | Discharge status: Home / Self Care | Payer: MEDICARE | Source: Ambulatory Visit | Attending: Family | Admitting: Family

## 2015-05-31 VITALS — BP 134/68 | HR 67 | Temp 97.4°F | Resp 16 | Ht 65.0 in | Wt 149.0 lb

## 2015-05-31 DIAGNOSIS — Z48812 Encounter for surgical aftercare following surgery on the circulatory system: Secondary | ICD-10-CM | POA: Diagnosis not present

## 2015-05-31 DIAGNOSIS — Z9889 Other specified postprocedural states: Secondary | ICD-10-CM | POA: Diagnosis not present

## 2015-05-31 DIAGNOSIS — I6521 Occlusion and stenosis of right carotid artery: Secondary | ICD-10-CM

## 2015-05-31 DIAGNOSIS — I6522 Occlusion and stenosis of left carotid artery: Secondary | ICD-10-CM | POA: Diagnosis not present

## 2015-05-31 NOTE — Progress Notes (Signed)
Chief Complaint: Extracranial Carotid Artery Stenosis   History of Present Illness  Kayla Davis is a 82 y.o. female patient of Dr. Kellie Simmering who is status post right carotid endarterectomy on 01/20/2014; she had episodes of amaurosis fugax in her right eye. Her visual symptoms have completely resolved since her surgery. She takes one aspirin per day. She denies any episodes of lateralizing weakness, aphasia, amaurosis fugax, diplopia, blurred vision, or syncope.  She denies any subsequent TIA's. She has no history of stroke.  The patient reports New Medical or Surgical History: left total hip replacement on 03/23/15   Pt Diabetic: yes, 6.4 A1C in August of 2016 (review of records)  Pt smoker: non-smoker  Pt meds include: Statin : yes ASA: yes Other anticoagulants/antiplatelets: no   Past Medical History  Diagnosis Date  . Hypertension   . Hyperlipidemia   . Aortic valve disorders   . Unspecified hereditary and idiopathic peripheral neuropathy   . Other abnormal glucose   . Unspecified hearing loss   . Unspecified chronic bronchitis ([place])   . Unspecified hypothyroidism   . Unspecified gastritis and gastroduodenitis without mention of hemorrhage   . Diverticulosis of colon (without mention of hemorrhage)   . Irritable bowel syndrome   . Osteoarthrosis, unspecified whether generalized or localized, unspecified site   . Backache, unspecified   . Pain in limb   . Osteoporosis, unspecified   . Herpes zoster with other nervous system complications(053.19)   . Anxiety state, unspecified   . Anemia, unspecified   . Bradycardia     s/p PPM  . Complication of anesthesia     has had difficulty awakening in past; pt states did well with the anesthesia given during her carotid surgery   . TIA (transient ischemic attack)   . Heart murmur   . History of shingles   . Presence of permanent cardiac pacemaker   . Peripheral vascular disease ([place])   . Hemorrhoids   . Varicose veins    . Urinary incontinence   . Bursitis   . Menopause   . History of frequent urinary tract infections   . History of measles as a child   . History of mumps as a child     Social History Social History  Substance Use Topics  . Smoking status: Never Smoker   . Smokeless tobacco: Never Used  . Alcohol Use: No    Family History Family History  Problem Relation Age of Onset  . Heart disease Father   . Cancer Paternal Grandmother   . Diabetes Father   . Heart disease Mother     Surgical History Past Surgical History  Procedure Laterality Date  . Pacemaker insertion      MDT  . Appendectomy    . Cholecystectomy    . Tonsillectomy    . Vaginal hysterectomy    . Cataract extraction, bilateral  end of June 2013  . Cataract extraction, bilateral  beginning of July 2013  . Endarterectomy Right 01/20/2014    Procedure: RIGHT CAROTID ARTERY ENDARTERECTOMY WITH DACRON PATCH ANGIOPLASTY;  Surgeon: Mal Misty, MD;  Location: [facility];  Service: Vascular;  Laterality: Right;  . Dilation and curettage of uterus    . Total hip arthroplasty Left 03/23/2015    Procedure: TOTAL HIP ARTHROPLASTY ANTERIOR APPROACH;  Surgeon: Gaynelle Arabian, MD;  Location: [facility];  Service: Orthopedics;  Laterality: Left;    Allergies  Allergen Reactions  . Penicillins Anaphylaxis    REACTION: swells up  all over body, pt states she almost died Has patient had a PCN reaction causing immediate rash, facial/tongue/throat swelling, SOB or lightheadedness with hypotension: Yes Has patient had a PCN reaction causing severe rash involving mucus membranes or skin necrosis: known Has patient had a PCN reaction that required hospitalization Yes Has patient had a PCN reaction occurring within the last 10 years: No If all of the above answers are "NO", then may proceed with Cephalosporin u  . Alprazolam      REACTION: eyes swell up  . Bactrim [Sulfamethoxazole-Trimethoprim]     Unable to remember the side effects  .  Ceclor [Cefaclor]     Unsure of the side effects  . Cephalexin     Unsure of the side effects  . Ciprofloxacin Itching and Swelling    Pt states that she tolerated po cipro well, but not the higher dose given IV.  [name] Clindamycin/Lincomycin     Upset stomach  . Hydrocodone     Unsure of reaction to med  . Noroxin [Norfloxacin]     Unsure of side effects  . Sulfonamide Derivatives     REACTION: itching  . Tetracyclines & Related     Unsure of the reaction to this medication  . Tramadol     Did not like how she felt    Current Outpatient Prescriptions  Medication Sig Dispense Refill  . glucose blood (ONE TOUCH TEST STRIPS) test strip 1 each by Other route 2 (two) times daily. Use as instructed 100 each 12  . hydroxypropyl methylcellulose / hypromellose (ISOPTO TEARS / GONIOVISC) 2.5 % ophthalmic solution Place 1 drop into both eyes 2 (two) times daily.    [name] ipratropium (ATROVENT) 0.06 % nasal spray Place 2 sprays into the nose 2 (two) times daily. 45 mL 2  . Lancets (ONETOUCH ULTRASOFT) lancets Use as instructed 100 each 12  . levothyroxine (SYNTHROID) 125 MCG tablet Take 1 tablet (125 mcg total) by mouth daily before breakfast. 90 tablet 3  . loratadine (CLARITIN) 10 MG tablet Take 10 mg by mouth daily as needed for allergies.     [name] losartan-hydrochlorothiazide (HYZAAR) 50-12.5 MG tablet Take 0.5 tablets by mouth daily. 45 tablet 3  . methocarbamol (ROBAXIN) 500 MG tablet Take 1 tablet (500 mg total) by mouth every 6 (six) hours as needed for muscle spasms. 60 tablet 0  . polyethylene glycol (MIRALAX / GLYCOLAX) packet Take 17 g by mouth daily as needed.    . pregabalin (LYRICA) 75 MG capsule Take 75 mg by mouth at bedtime.    . rivaroxaban (XARELTO) 10 MG TABS tablet Take 1 tablet (10 mg total) by mouth daily with breakfast. 18 tablet 0  . simvastatin (ZOCOR) 40 MG tablet Take 1 tablet (40 mg total) by mouth daily with supper. 90 tablet 0  . sodium chloride (OCEAN) 0.65 % SOLN nasal  spray Place 1 spray into both nostrils 2 (two) times daily.     . sodium chloride irrigation 0.9 % irrigation Irrigate with 1 application as directed 2 (two) times daily as needed.    . [DISCONTINUED] valsartan-hydrochlorothiazide (DIOVAN-HCT) 80-12.5 MG per tablet TAKE 1 TABLET BY MOUTH DAILY 90 tablet 3   No current facility-administered medications for this visit.    Review of Systems : See HPI for pertinent positives and negatives.  Physical Examination  Filed Vitals:   05/31/15 1540 05/31/15 1543  BP: 138/65 134/68  Pulse: 66 67  Temp: 97.4 F (36.3 C)   Resp: 16  Height: 5\' 5"  (1.651 m)   Weight: 149 lb (67.586 kg)   SpO2: 96%    \Body mass index is 24.79 kg/(m^2).   General: WDWN female in NAD GAIT: using walker Eyes: PERRLA Pulmonary:  Non-labored, CTAB,   Cardiac: regular rhythm,  no detected murmur.  VASCULAR EXAM Carotid Bruits Right Left   Negative Negative    Aorta is not palpable. Radial pulses are 2+ palpable and equal.                                                                                                                            LE Pulses Right Left       POPLITEAL  not palpable   not palpable    Gastrointestinal: soft, nontender, BS WNL, no r/g,  no palpable masses.  Musculoskeletal: no muscle atrophy/wasting. M/S 5/5 throughout except 4/5 left leg, extremities without ischemic changes.  Neurologic: A&O X 3; Appropriate Affect, Speech is normal CN 2-12 intact except is hard of hearing, pain and light touch intact in extremities, Motor exam as listed above.   Non-Invasive Vascular Imaging CAROTID DUPLEX 05/31/2015   CEREBROVASCULAR DUPLEX EVALUATION    INDICATION: Carotid artery disease    PREVIOUS INTERVENTION(S): Right carotid endarterectomy 01/20/2014    DUPLEX EXAM: Carotid duplex    RIGHT  LEFT  Peak Systolic Velocities (cm/s) End Diastolic Velocities (cm/s) Plaque LOCATION Peak Systolic Velocities (cm/s) End Diastolic  Velocities (cm/s) Plaque  79 13  CCA PROXIMAL 87 14   84 12  CCA MID 103 15 HT  127 17 HM CCA DISTAL 79 15 HT  84 7  ECA 104 10 HT  80 10  ICA PROXIMAL 106 19 CP  73 18  ICA MID 79 16   74 19  ICA DISTAL 65 15     N/A ICA / CCA Ratio (PSV) 1.0  Antegrade Vertebral Flow Antegrade  - Brachial Systolic Pressure (mmHg) -  Triphasic Brachial Artery Waveforms Triphasic    Plaque Morphology:  HM = Homogeneous, HT = Heterogeneous, CP = Calcific Plaque, SP = Smooth Plaque, IP = Irregular Plaque     ADDITIONAL FINDINGS: Multiphasic subclavian arteries    IMPRESSION: 1. Patent right endarterectomy site with no evidence for restenosis 2. Less than 40% left internal carotid artery stenosis.    Compared to the previous exam:  No change since exam of 09/12/2014      Assessment: KARIAH GIFFEN is a 82 y.o. female who is status post right carotid endarterectomy on 01/20/2014; she had episodes of amaurosis fugax in her right eye. Her visual symptoms have completely resolved since her surgery. She has had no subsequent stroke or TIA. She takes one aspirin per day and a statin.  Today's carotid duplex suggests a patent right endarterectomy site with no evidence for restenosis and less than 40% left internal carotid artery stenosis. No change since exam of 09/12/2014.    Plan: Follow-up in 1 year with Carotid Duplex  scan.   I discussed in depth with the patient the nature of atherosclerosis, and emphasized the importance of maximal medical management including strict control of blood pressure, blood glucose, and lipid levels, obtaining regular exercise, and continued cessation of smoking.  The patient is aware that without maximal medical management the underlying atherosclerotic disease process will progress, limiting the benefit of any interventions. The patient was given information about stroke prevention and what symptoms should prompt the patient to seek immediate medical care. Thank you  for allowing [place] to participate in this patient's care.  Clemon Chambers, RN, MSN, FNP-C [facility] Office: [phone]  Clinic Physician: Oneida Alar  05/31/2015 3:34 PM

## 2015-05-31 NOTE — Patient Instructions (Signed)
Stroke Prevention Some medical conditions and behaviors are associated with an increased chance of having a stroke. You may prevent a stroke by making healthy choices and managing medical conditions. HOW CAN I REDUCE MY RISK OF HAVING A STROKE?   Stay physically active. Get at least 30 minutes of activity on most or all days.  Do not smoke. It may also be helpful to avoid exposure to secondhand smoke.  Limit alcohol use. Moderate alcohol use is considered to be:  No more than 2 drinks per day for men.  No more than 1 drink per day for nonpregnant women.  Eat healthy foods. This involves:  Eating 5 or more servings of fruits and vegetables a day.  Making dietary changes that address high blood pressure (hypertension), high cholesterol, diabetes, or obesity.  Manage your cholesterol levels.  Making food choices that are high in fiber and low in saturated fat, trans fat, and cholesterol may control cholesterol levels.  Take any prescribed medicines to control cholesterol as directed by your health care provider.  Manage your diabetes.  Controlling your carbohydrate and sugar intake is recommended to manage diabetes.  Take any prescribed medicines to control diabetes as directed by your health care provider.  Control your hypertension.  Making food choices that are low in salt (sodium), saturated fat, trans fat, and cholesterol is recommended to manage hypertension.  Ask your health care provider if you need treatment to lower your blood pressure. Take any prescribed medicines to control hypertension as directed by your health care provider.  If you are 18-39 years of age, have your blood pressure checked every 3-5 years. If you are 40 years of age or older, have your blood pressure checked every year.  Maintain a healthy weight.  Reducing calorie intake and making food choices that are low in sodium, saturated fat, trans fat, and cholesterol are recommended to manage  weight.  Stop drug abuse.  Avoid taking birth control pills.  Talk to your health care provider about the risks of taking birth control pills if you are over 35 years old, smoke, get migraines, or have ever had a blood clot.  Get evaluated for sleep disorders (sleep apnea).  Talk to your health care provider about getting a sleep evaluation if you snore a lot or have excessive sleepiness.  Take medicines only as directed by your health care provider.  For some people, aspirin or blood thinners (anticoagulants) are helpful in reducing the risk of forming abnormal blood clots that can lead to stroke. If you have the irregular heart rhythm of atrial fibrillation, you should be on a blood thinner unless there is a good reason you cannot take them.  Understand all your medicine instructions.  Make sure that other conditions (such as anemia or atherosclerosis) are addressed. SEEK IMMEDIATE MEDICAL CARE IF:   You have sudden weakness or numbness of the face, arm, or leg, especially on one side of the body.  Your face or eyelid droops to one side.  You have sudden confusion.  You have trouble speaking (aphasia) or understanding.  You have sudden trouble seeing in one or both eyes.  You have sudden trouble walking.  You have dizziness.  You have a loss of balance or coordination.  You have a sudden, severe headache with no known cause.  You have new chest pain or an irregular heartbeat. Any of these symptoms may represent a serious problem that is an emergency. Do not wait to see if the symptoms will   go away. Get medical help at once. Call your local emergency services (911 in [place] Do not drive yourself to the hospital.   This information is not intended to replace advice given to you by your health care provider. Make sure you discuss any questions you have with your health care provider.   Document Released: 05/08/2004 Document Revised: 04/21/2014 Document Reviewed:  10/01/2012 Elsevier Interactive Patient Education 2016 [employer]  

## 2015-06-01 ENCOUNTER — Encounter (HOSPITAL_COMMUNITY): Payer: MEDICARE

## 2015-06-01 ENCOUNTER — Ambulatory Visit: Payer: MEDICARE | Admitting: Family

## 2015-06-01 NOTE — Addendum Note (Signed)
Addended by: Dorthula Rue L on: 06/01/2015 10:00 AM   Modules accepted: Orders

## 2015-06-05 DIAGNOSIS — Z96642 Presence of left artificial hip joint: Secondary | ICD-10-CM | POA: Diagnosis not present

## 2015-06-05 DIAGNOSIS — Z471 Aftercare following joint replacement surgery: Secondary | ICD-10-CM | POA: Diagnosis not present

## 2015-06-27 ENCOUNTER — Other Ambulatory Visit (INDEPENDENT_AMBULATORY_CARE_PROVIDER_SITE_OTHER): Payer: MEDICARE

## 2015-06-27 ENCOUNTER — Encounter: Payer: Self-pay | Admitting: Internal Medicine

## 2015-06-27 ENCOUNTER — Ambulatory Visit (INDEPENDENT_AMBULATORY_CARE_PROVIDER_SITE_OTHER): Payer: MEDICARE | Admitting: Internal Medicine

## 2015-06-27 VITALS — BP 130/60 | HR 66 | Wt 153.0 lb

## 2015-06-27 DIAGNOSIS — M810 Age-related osteoporosis without current pathological fracture: Secondary | ICD-10-CM

## 2015-06-27 DIAGNOSIS — E871 Hypo-osmolality and hyponatremia: Secondary | ICD-10-CM

## 2015-06-27 DIAGNOSIS — I6521 Occlusion and stenosis of right carotid artery: Secondary | ICD-10-CM | POA: Diagnosis not present

## 2015-06-27 DIAGNOSIS — R7309 Other abnormal glucose: Secondary | ICD-10-CM

## 2015-06-27 DIAGNOSIS — R531 Weakness: Secondary | ICD-10-CM

## 2015-06-27 DIAGNOSIS — R269 Unspecified abnormalities of gait and mobility: Secondary | ICD-10-CM

## 2015-06-27 LAB — BASIC METABOLIC PANEL
BUN: 20 mg/dL (ref 6–23)
CO2: 30 mEq/L (ref 19–32)
Calcium: 9.6 mg/dL (ref 8.4–10.5)
Chloride: 98 mEq/L (ref 96–112)
Creatinine, Ser: 0.77 mg/dL (ref 0.40–1.20)
GFR: 74.76 mL/min (ref >60.00)
GLUCOSE: 141 mg/dL — AB (ref 70–99)
POTASSIUM: 4.1 meq/L (ref 3.5–5.1)
SODIUM: 133 meq/L — AB (ref 135–145)

## 2015-06-27 LAB — TSH: TSH: 0.11 u[IU]/mL — ABNORMAL LOW (ref 0.35–4.50)

## 2015-06-27 LAB — HEMOGLOBIN A1C: HEMOGLOBIN A1C: 6.7 % — AB (ref 4.6–6.5)

## 2015-06-27 MED ORDER — GLUCOSE BLOOD VI STRP: 1.0000 | ORAL_STRIP | Freq: Two times a day (BID) | Status: DC

## 2015-06-27 NOTE — Assessment & Plan Note (Signed)
On Vit D 

## 2015-06-27 NOTE — Assessment & Plan Note (Signed)
Labs

## 2015-06-27 NOTE — Patient Instructions (Signed)
[place]

## 2015-06-27 NOTE — Progress Notes (Signed)
Subjective:  Patient ID: Kayla Davis, female    DOB: 12-25-1922  Age: 82 y.o. MRN: [id number]  CC: No chief complaint on file.   HPI THUY ROUTT presents for gait disorder, HTN, OA, dyslipidemia f/u. Needs PT at home  Outpatient Prescriptions Prior to Visit  Medication Sig Dispense Refill  . hydroxypropyl methylcellulose / hypromellose (ISOPTO TEARS / GONIOVISC) 2.5 % ophthalmic solution Place 1 drop into both eyes 2 (two) times daily. Reported on 05/31/2015    . ipratropium (ATROVENT) 0.06 % nasal spray Place 2 sprays into the nose 2 (two) times daily. 45 mL 2  . Lancets (ONETOUCH ULTRASOFT) lancets Use as instructed 100 each 12  . levothyroxine (SYNTHROID) 125 MCG tablet Take 1 tablet (125 mcg total) by mouth daily before breakfast. 90 tablet 3  . loratadine (CLARITIN) 10 MG tablet Take 10 mg by mouth daily as needed for allergies.     [name] losartan-hydrochlorothiazide (HYZAAR) 50-12.5 MG tablet Take 0.5 tablets by mouth daily. 45 tablet 3  . polyethylene glycol (MIRALAX / GLYCOLAX) packet Take 17 g by mouth daily as needed.    . pregabalin (LYRICA) 75 MG capsule Take 75 mg by mouth at bedtime.    . simvastatin (ZOCOR) 40 MG tablet Take 1 tablet (40 mg total) by mouth daily with supper. 90 tablet 0  . sodium chloride (OCEAN) 0.65 % SOLN nasal spray Place 1 spray into both nostrils 2 (two) times daily. Reported on 05/31/2015    . glucose blood (ONE TOUCH TEST STRIPS) test strip 1 each by Other route 2 (two) times daily. Use as instructed 100 each 12  . methocarbamol (ROBAXIN) 500 MG tablet Take 1 tablet (500 mg total) by mouth every 6 (six) hours as needed for muscle spasms. (Patient not taking: Reported on 06/27/2015) 60 tablet 0  . rivaroxaban (XARELTO) 10 MG TABS tablet Take 1 tablet (10 mg total) by mouth daily with breakfast. (Patient not taking: Reported on 06/27/2015) 18 tablet 0  . sodium chloride irrigation 0.9 % irrigation Irrigate with 1 application as directed 2 (two) times  daily as needed. Reported on 06/27/2015     No facility-administered medications prior to visit.    ROS Review of Systems  Constitutional: Positive for fatigue. Negative for chills, activity change, appetite change and unexpected weight change.  HENT: Negative for congestion, mouth sores and sinus pressure.   Eyes: Negative for visual disturbance.  Respiratory: Negative for cough and chest tightness.   Gastrointestinal: Negative for nausea and abdominal pain.  Genitourinary: Negative for frequency, difficulty urinating and vaginal pain.  Musculoskeletal: Positive for arthralgias and gait problem. Negative for back pain.  Skin: Negative for pallor and rash.  Neurological: Negative for dizziness, tremors, weakness, numbness and headaches.  Psychiatric/Behavioral: Negative for confusion and sleep disturbance.    Objective:  BP 130/60 mmHg  Pulse 66  Wt 153 lb (69.4 kg)  SpO2 95%  BP Readings from Last 3 Encounters:  06/27/15 130/60  05/31/15 134/68  03/25/15 153/50    Wt Readings from Last 3 Encounters:  06/27/15 153 lb (69.4 kg)  05/31/15 149 lb (67.586 kg)  03/23/15 148 lb (67.132 kg)    Physical Exam  Constitutional: She appears well-developed. No distress.  HENT:  Head: Normocephalic.  Right Ear: External ear normal.  Left Ear: External ear normal.  Nose: Nose normal.  Mouth/Throat: Oropharynx is clear and moist.  Eyes: Conjunctivae are normal. Pupils are equal, round, and reactive to light. Right eye exhibits no discharge.  Left eye exhibits no discharge.  Neck: Normal range of motion. Neck supple. No JVD present. No tracheal deviation present. No thyromegaly present.  Cardiovascular: Normal rate, regular rhythm and normal heart sounds.   Pulmonary/Chest: No stridor. No respiratory distress. She has no wheezes.  Abdominal: Soft. Bowel sounds are normal. She exhibits no distension and no mass. There is no tenderness. There is no rebound and no guarding.    Musculoskeletal: She exhibits tenderness. She exhibits no edema.  Lymphadenopathy:    She has no cervical adenopathy.  Neurological: She displays normal reflexes. No cranial nerve deficit. She exhibits normal muscle tone. Coordination abnormal.  Skin: No rash noted. No erythema.  Psychiatric: She has a normal mood and affect. Her behavior is normal. Judgment and thought content normal.  walker  Lab Results  Component Value Date   WBC 9.6 03/25/2015   HGB 8.9* 03/25/2015   HCT 26.4* 03/25/2015   PLT 141* 03/25/2015   GLUCOSE 178* 03/25/2015   CHOL 161 01/17/2014   TRIG 56 01/17/2014   HDL 68 01/17/2014   LDLDIRECT 143.0 10/07/2007   LDLCALC 82 01/17/2014   ALT 15 03/16/2015   AST 18 03/16/2015   NA 139 03/25/2015   K 4.0 03/25/2015   CL 105 03/25/2015   CREATININE 0.68 03/25/2015   BUN 24* 03/25/2015   CO2 26 03/25/2015   TSH 0.70 04/21/2014   INR 1.06 03/16/2015   HGBA1C 6.4 11/22/2014    No results found.  Assessment & Plan:   Diagnoses and all orders for this visit:  Gait disorder -     Ambulatory referral to [facility] -     TSH; Future -     Basic metabolic panel; Future -     Hemoglobin A1c; Future  Weakness generalized -     TSH; Future -     Basic metabolic panel; Future -     Hemoglobin A1c; Future  Osteoporosis -     TSH; Future -     Basic metabolic panel; Future -     Hemoglobin A1c; Future  DIABETES MELLITUS, BORDERLINE -     TSH; Future -     Basic metabolic panel; Future -     Hemoglobin A1c; Future  Hyponatremia -     TSH; Future -     Basic metabolic panel; Future -     Hemoglobin A1c; Future  Other orders -     glucose blood (ONE TOUCH TEST STRIPS) test strip; 1 each by Other route 2 (two) times daily. Use as instructed  I am having Ms. Quentin Cornwall maintain her sodium chloride, onetouch ultrasoft, loratadine, pregabalin, hydroxypropyl methylcellulose / hypromellose, polyethylene glycol, sodium chloride irrigation, levothyroxine,  ipratropium, losartan-hydrochlorothiazide, methocarbamol, rivaroxaban, simvastatin, and glucose blood.  Meds ordered this encounter  Medications  . glucose blood (ONE TOUCH TEST STRIPS) test strip    Sig: 1 each by Other route 2 (two) times daily. Use as instructed    Dispense:  100 each    Refill:  11    Dx: R73.09     Follow-up: Return in about 4 months (around 10/27/2015) for a follow-up visit.  [name], MD

## 2015-06-27 NOTE — Progress Notes (Signed)
Pre visit review using our clinic review tool, if applicable. No additional management support is needed unless otherwise documented below in the visit note. 

## 2015-06-27 NOTE — Assessment & Plan Note (Signed)
3/17 multifactorial [facility] for "Safe stride balance walking" Pt is home bound

## 2015-06-27 NOTE — Assessment & Plan Note (Signed)
Chronic  Start PT

## 2015-06-28 ENCOUNTER — Telehealth: Payer: Self-pay | Admitting: Internal Medicine

## 2015-06-28 MED ORDER — GLUCOSE BLOOD VI STRP: 1.0000 | ORAL_STRIP | Freq: Two times a day (BID) | Status: DC

## 2015-06-28 NOTE — Telephone Encounter (Signed)
Pt request test strip [facility] [address]. Medicare do not pay for it if its goes to [facility]. Pt also want the result for lab that was done yesterday.

## 2015-06-28 NOTE — Telephone Encounter (Signed)
Test strips sent to local pharmacy. See meds.    Please advise on labs.

## 2015-06-28 NOTE — Telephone Encounter (Signed)
Pt informed of labs. See lab results.

## 2015-06-29 ENCOUNTER — Other Ambulatory Visit: Payer: Self-pay | Admitting: *Deleted

## 2015-06-29 MED ORDER — GLUCOSE BLOOD VI STRP: 1.0000 | ORAL_STRIP | Freq: Two times a day (BID) | Status: DC

## 2015-07-03 ENCOUNTER — Ambulatory Visit (INDEPENDENT_AMBULATORY_CARE_PROVIDER_SITE_OTHER): Payer: MEDICARE

## 2015-07-03 VITALS — BP 120/60 | HR 65 | Ht 65.0 in | Wt 151.0 lb

## 2015-07-03 DIAGNOSIS — Z0000 Encounter for general adult medical examination without abnormal findings: Secondary | ICD-10-CM | POA: Diagnosis not present

## 2015-07-03 NOTE — Progress Notes (Addendum)
Subjective:   Kayla Davis is a 82 y.o. female who presents for Medicare Annual (Subsequent) preventive examination.  Review of Systems:  HRA assessment completed during visit; [place], [place]  The Patient was informed that this wellness visit is to identify risk and educate on how to reduce risk for increase disease through lifestyle changes.   ROS deferred to CPE exam with physician 06/27/2015  Medical and family hx Father HD and DM; Mother had HD paternal Grandmother cancer  Has 4 children; 44 grandchildren;11 to 24 yo. Children  live in surrounding areas Married 44 years; spouse expired x 85 yo   Medical issues  s/p right carotid endarterectomy 01/2014 Lipids 01/2014; chol 161; Trig 56; HDL 68 LDL 82  TSH low; educated and asymptomatic of issues  States she has energy; had therapy at home post op. Will continue PT with "stride walking" PT to start around April 1st Enc to keep exercising which she is doing.   03/23/2015 THR; Osteoporosis: osteopenic; discussed dexa but declined for now.  Educated on calcium intake and vit d; Given list of calcium rich foods    DM2 Labs 3/15; A1c 6.7 [facility] for "Safe stride balance walking" Pt is home bound/to start in April; May move from walker to cane; Discussed safety issues when out from home  BMI: 25  Diet; eats fruits every meal;  Eats oatmeal for breakfast; about once a week; eats eggs; grits and 2 pieces of toast. Lunch; eats leftovers; Supper; eat vegetable and fruit; meat  Exercise; exercise with therapy;  Will ask the therapist to give her a home program before discharging   SAFETY; son works at [employer] and lives with her in one level home. Has a ramp; has rails on front porch w steps;  states it was a big decision to have surgery but pain is gone and she is very glad she had it. Removal of clutter clearing paths through the home,  Railing as needed;  Bathroom safety; had lift to assist with getting in tub; now  uses transfer bench; gets legs over tub easily Can now get foot over tub very well;   Community safety; yes Smoke detectors yes Firearms safety / to keep in safe place if they exist Driving accidents and seatbelt/ no accidents Sun protection/ not out in the sun very much;  Goes to a skin doctor; dermatologist watches skin cancer on right upper jawbone area; and on nose; goes annually or as needed; will make apt soon  Stressors; none  Medication review/  Checks BS x 2 q day; generally runs 90- 100   Fall assessment: had one fall about one year ago;  Gait assessment/ good with walker;   Mobilization and Functional losses in the last year./no losses but dtr does come and assist as needed with IADL  Sleep patterns; sleeps well at hs; unless something is planned for the next day;    Urinary or fecal incontinence reviewed/urinary incont; wears a pad; has tried medicine  Periodic bladder infection   Lifeline: http://www.lifelinesys.com/content/home; [phone] [phone]  Has a lifeline on her person today  Counseling: Foot exam: feet warm and dry; pulses to both feet difficult to palpate but nail beds blanch; sensation good; no areas of abrasion; cuts etc;  The patient cuts her toenails; states she sees ok; toenails are not thick. No areas of issue noted; Does have cushion type dressing between great toe on right foot due to corn on 2nd digit;  Checks her feet every day  after she takes a shower.  Colonoscopy: Had one; now aged out;  Takes laxative every day to prevent constipation;  EKG: 06/2014 Mammogram 06/2006; does not continue to have mammograms Dexa 04/2010 osteopenic; taking vit d/ discussed calcium and exercise PAP; waive Hearing: can't hear on left x with hearing aid; but has hearing aid for both  Ophthalmology exam; 20/20 last august  Immunizations: Tetanus had one but doctor Lenna Gilford stated she didn't need unless she gets a "dirty" scrape or hurt otherwise   Zostavax;  did have chicken pox; Had shingles in her vagina; On lyrica now; will discuss shingles with Dr. Alain Marion prior to taking;   Has a pacemaker; x 6 years   Advanced Directive; YES Health advice or referrals Will fup with Dermatology; Will call for any symptoms of urine infection Educated regarding labs; TSH to be rechecked;   Current Care Team reviewed and updated Cardiac Risk Factors include: advanced age (>1men, >67 women);hypertension;dyslipidemia;diabetes mellitus     Objective:     Vitals: BP 120/60 mmHg  Pulse 65  Ht 5\' 5"  (1.651 m)  Wt 151 lb (68.493 kg)  BMI 25.13 kg/m2  SpO2 96%  Body mass index is 25.13 kg/(m^2).   Tobacco History  Smoking status  . Never Smoker   Smokeless tobacco  . Never Used     Counseling given: Yes   Past Medical History  Diagnosis Date  . Hypertension   . Hyperlipidemia   . Aortic valve disorders   . Unspecified hereditary and idiopathic peripheral neuropathy   . Other abnormal glucose   . Unspecified hearing loss   . Unspecified chronic bronchitis ([place])   . Unspecified hypothyroidism   . Unspecified gastritis and gastroduodenitis without mention of hemorrhage   . Diverticulosis of colon (without mention of hemorrhage)   . Irritable bowel syndrome   . Osteoarthrosis, unspecified whether generalized or localized, unspecified site   . Backache, unspecified   . Pain in limb   . Osteoporosis, unspecified   . Herpes zoster with other nervous system complications(053.19)   . Anxiety state, unspecified   . Anemia, unspecified   . Bradycardia     s/p PPM  . Complication of anesthesia     has had difficulty awakening in past; pt states did well with the anesthesia given during her carotid surgery   . TIA (transient ischemic attack)   . Heart murmur   . History of shingles   . Presence of permanent cardiac pacemaker   . Peripheral vascular disease ([place])   . Hemorrhoids   . Varicose veins   . Urinary incontinence   . Bursitis     . Menopause   . History of frequent urinary tract infections   . History of measles as a child   . History of mumps as a child    Past Surgical History  Procedure Laterality Date  . Pacemaker insertion      MDT  . Appendectomy    . Cholecystectomy    . Tonsillectomy    . Vaginal hysterectomy    . Cataract extraction, bilateral  end of June 2013  . Cataract extraction, bilateral  beginning of July 2013  . Endarterectomy Right 01/20/2014    Procedure: RIGHT CAROTID ARTERY ENDARTERECTOMY WITH DACRON PATCH ANGIOPLASTY;  Surgeon: Mal Misty, MD;  Location: [facility];  Service: Vascular;  Laterality: Right;  . Dilation and curettage of uterus    . Total hip arthroplasty Left 03/23/2015    Procedure: TOTAL HIP ARTHROPLASTY ANTERIOR  APPROACH;  Surgeon: Gaynelle Arabian, MD;  Location: [facility];  Service: Orthopedics;  Laterality: Left;   Family History  Problem Relation Age of Onset  . Heart disease Father   . Cancer Paternal Grandmother   . Diabetes Father   . Heart disease Mother    History  Sexual Activity  . Sexual Activity: Not on file    Outpatient Encounter Prescriptions as of 07/03/2015  Medication Sig  . aspirin 81 MG tablet Take 81 mg by mouth daily.  . fluticasone (FLONASE ALLERGY RELIEF) 50 MCG/ACT nasal spray Place into both nostrils daily.  [name] glucose blood (ONE TOUCH TEST STRIPS) test strip 1 each by Other route 2 (two) times daily. Use as instructed  . hydroxypropyl methylcellulose / hypromellose (ISOPTO TEARS / GONIOVISC) 2.5 % ophthalmic solution Place 1 drop into both eyes 2 (two) times daily. Reported on 05/31/2015  . levothyroxine (SYNTHROID) 125 MCG tablet Take 1 tablet (125 mcg total) by mouth daily before breakfast.  . loratadine (CLARITIN) 10 MG tablet Take 10 mg by mouth daily as needed for allergies.   [name] losartan-hydrochlorothiazide (HYZAAR) 50-12.5 MG tablet Take 0.5 tablets by mouth daily.  . polyethylene glycol (MIRALAX / GLYCOLAX) packet Take 17 g by mouth  daily as needed.  . pregabalin (LYRICA) 75 MG capsule Take 75 mg by mouth at bedtime.  . simvastatin (ZOCOR) 40 MG tablet Take 1 tablet (40 mg total) by mouth daily with supper.  . sodium chloride (OCEAN) 0.65 % SOLN nasal spray Place 1 spray into both nostrils 2 (two) times daily. Reported on 05/31/2015  . sodium chloride irrigation 0.9 % irrigation Irrigate with 1 application as directed 2 (two) times daily as needed. Reported on 06/27/2015  . ipratropium (ATROVENT) 0.06 % nasal spray Place 2 sprays into the nose 2 (two) times daily. (Patient not taking: Reported on 07/03/2015)  . Lancets (ONETOUCH ULTRASOFT) lancets Use as instructed  . methocarbamol (ROBAXIN) 500 MG tablet Take 1 tablet (500 mg total) by mouth every 6 (six) hours as needed for muscle spasms. (Patient not taking: Reported on 06/27/2015)  . rivaroxaban (XARELTO) 10 MG TABS tablet Take 1 tablet (10 mg total) by mouth daily with breakfast. (Patient not taking: Reported on 06/27/2015)   No facility-administered encounter medications on file as of 07/03/2015.    Activities of Daily Living In your present state of health, do you have any difficulty performing the following activities: 07/03/2015 03/23/2015  Hearing? Tempie Donning  Vision? Y N  Difficulty concentrating or making decisions? N N  Walking or climbing stairs? Y Y  Dressing or bathing? N Y  Doing errands, shopping? N Y  Conservation officer, nature and eating ? N -  Using the Toilet? N -  In the past six months, have you accidently leaked urine? Y -  Do you have problems with loss of bowel control? N -  Managing your Medications? N -  Managing your Finances? N -  Housekeeping or managing your Housekeeping? N -    Patient Care Team: [name], MD as PCP - General (Internal Medicine) Shon Hough, MD as Consulting Physician (Ophthalmology) Thompson Grayer, MD as Consulting Physician (Cardiology) Suella Broad, MD as Consulting Physician (Physical Medicine and  Rehabilitation) Gaynelle Arabian, MD as Consulting Physician (Orthopedic Surgery)    Assessment:     Exercise Activities and Dietary recommendations Current Exercise Habits: Home exercise routine, Time (Minutes): 40 (breaks up 20 minutes on the bed and 20 minutes in the [place]), Frequency (Times/Week): 5, Weekly Exercise (  Minutes/Week): 200, Intensity: Mild  Goals    . patient     Wants to live to see 40 and 80 year old grow up. Keep exercising; keep moving; keep busy      Fall Risk Fall Risk  07/03/2015 07/06/2014 09/14/2012  Falls in the past year? No No No   Depression Screen PHQ 2/9 Scores 07/03/2015 07/06/2014 09/14/2012  PHQ - 2 Score 0 0 0     Cognitive Testing MMSE - Mini Mental State Exam 07/03/2015  Not completed: (No Data)    Immunization History  Administered Date(s) Administered  . Influenza Split 01/13/2011, 12/30/2011, 02/01/2013  . Influenza Whole 12/21/2008, 01/12/2010  . Influenza, High Dose Seasonal PF 12/09/2013  . Influenza,inj,Quad PF,36+ Mos 01/23/2015  . Pneumococcal Conjugate-13 04/21/2014  . Pneumococcal-Unspecified 02/25/2005   Screening Tests Health Maintenance  Topic Date Due  . FOOT EXAM  12/17/1934  . TETANUS/TDAP  12/17/1943  . ZOSTAVAX  12/16/1984  . DEXA SCAN  12/16/1989  . INFLUENZA VACCINE  11/13/2015  . OPHTHALMOLOGY EXAM  11/20/2015  . HEMOGLOBIN A1C  12/28/2015  . PNA vac Low Risk Adult  Completed      Plan:   Would like BP to stay in the 120/80 range Would call the doctor if lower the 90/60 for several days or if she feels dizzy;  Normal bp is < 140/80 and higher than 90/60   Will have your eye exam in August of this year   Checked urine on site; Dr. Alain Marion felt it was fine but call if fever or other bladder symptoms; (temp 98.3) no symptoms today; Just wanted it checked since she was here.   During the course of the visit the patient was educated and counseled about the following appropriate screening and preventive  services:   Vaccines to include Pneumoccal, Influenza, Hepatitis B, Td, Zostavax, HCV/ to fup with Dr. Alain Marion regarding shingles;  Was told by Dr. Lenna Gilford that she didn't need a TD unless she has accident; has multiple allergies  Electrocardiogram/ pacemaker; states it is good  Cardiovascular Disease/ no issues at present  Colorectal cancer screening/ aged out  Bone density screening: discussed getting calcium and Vit d  Will postpone for now; no falls; no fx   Diabetes screening/ A1c 6.7   Glaucoma screening/ to be rechecked in August 2017  Mammography/PAP/ waived per the patient  Nutrition counseling / BMI 25; nutrition adequate; Does not eat very much sugar; monitors her diabetes; Educated regarding calcium and milk, as her sugar intake is very modest; BP 100/60 but up to 120/60. Will check at home occasionally   Given copy of recent lab work at her request. Reviewed low TSH but will recheck an asymptomatic currently   Patient Instructions (the written plan) was given to the patient.   Wynetta Fines, RN  07/03/2015   Medical screening examination/treatment/procedure(s) were performed by non-physician practitioner and as supervising physician I was immediately available for consultation/collaboration. I agree with above. [name], MD

## 2015-07-03 NOTE — Patient Instructions (Addendum)
Kayla Davis , Thank you for taking time to come for your Medicare Wellness Visit. I appreciate your ongoing commitment to your health goals. Please review the following plan we discussed and let me know if I can assist you in the future.   Would like BP to stay in the 120/80 range Would call the doctor if lower the 90/60 for several days or if she feels dizzy;  Normal bp is < 140/80 and higher than 90/60   Will have your normal eye exam in August of this year   Checked urine on site; Dr. Alain Marion felt it was fine but call if fever or other bladder symptoms; (temp 98.3)    Goal is to see grand children grow up and spend time with kids;  These are the goals we discussed: Goals    None      This is a list of the screening recommended for you and due dates:  Health Maintenance  Topic Date Due  . Complete foot exam   12/17/1934  . Tetanus Vaccine  12/17/1943  . Shingles Vaccine  12/16/1984  . DEXA scan (bone density measurement)  12/16/1989  . Flu Shot  11/13/2015  . Eye exam for diabetics  11/20/2015  . Hemoglobin A1C  12/28/2015  . Pneumonia vaccines  Completed     Fall Prevention in the Home  Falls can cause injuries. They can happen to people of all ages. There are many things you can do to make your home safe and to help prevent falls.  WHAT CAN I DO ON THE OUTSIDE OF MY HOME?  Regularly fix the edges of walkways and driveways and fix any cracks.  Remove anything that might make you trip as you walk through a door, such as a raised step or threshold.  Trim any bushes or trees on the path to your home.  Use bright outdoor lighting.  Clear any walking paths of anything that might make someone trip, such as rocks or tools.  Regularly check to see if handrails are loose or broken. Make sure that both sides of any steps have handrails.  Any raised decks and porches should have guardrails on the edges.  Have any leaves, snow, or ice cleared regularly.  Use sand or  salt on walking paths during winter.  Clean up any spills in your garage right away. This includes oil or grease spills. WHAT CAN I DO IN THE BATHROOM?   Use night lights.  Install grab bars by the toilet and in the tub and shower. Do not use towel bars as grab bars.  Use non-skid mats or decals in the tub or shower.  If you need to sit down in the shower, use a plastic, non-slip stool.  Keep the floor dry. Clean up any water that spills on the floor as soon as it happens.  Remove soap buildup in the tub or shower regularly.  Attach bath mats securely with double-sided non-slip rug tape.  Do not have throw rugs and other things on the floor that can make you trip. WHAT CAN I DO IN THE BEDROOM?  Use night lights.  Make sure that you have a light by your bed that is easy to reach.  Do not use any sheets or blankets that are too big for your bed. They should not hang down onto the floor.  Have a firm chair that has side arms. You can use this for support while you get dressed.  Do not have throw rugs  and other things on the floor that can make you trip. WHAT CAN I DO IN THE KITCHEN?  Clean up any spills right away.  Avoid walking on wet floors.  Keep items that you use a lot in easy-to-reach places.  If you need to reach something above you, use a strong step stool that has a grab bar.  Keep electrical cords out of the way.  Do not use floor polish or wax that makes floors slippery. If you must use wax, use non-skid floor wax.  Do not have throw rugs and other things on the floor that can make you trip. WHAT CAN I DO WITH MY STAIRS?  Do not leave any items on the stairs.  Make sure that there are handrails on both sides of the stairs and use them. Fix handrails that are broken or loose. Make sure that handrails are as long as the stairways.  Check any carpeting to make sure that it is firmly attached to the stairs. Fix any carpet that is loose or worn.  Avoid having  throw rugs at the top or bottom of the stairs. If you do have throw rugs, attach them to the floor with carpet tape.  Make sure that you have a light switch at the top of the stairs and the bottom of the stairs. If you do not have them, ask someone to add them for you. WHAT ELSE CAN I DO TO HELP PREVENT FALLS?  Wear shoes that:  Do not have high heels.  Have rubber bottoms.  Are comfortable and fit you well.  Are closed at the toe. Do not wear sandals.  If you use a stepladder:  Make sure that it is fully opened. Do not climb a closed stepladder.  Make sure that both sides of the stepladder are locked into place.  Ask someone to hold it for you, if possible.  Clearly mark and make sure that you can see:  Any grab bars or handrails.  First and last steps.  Where the edge of each step is.  Use tools that help you move around (mobility aids) if they are needed. These include:  Canes.  Walkers.  Scooters.  Crutches.  Turn on the lights when you go into a dark area. Replace any light bulbs as soon as they burn out.  Set up your furniture so you have a clear path. Avoid moving your furniture around.  If any of your floors are uneven, fix them.  If there are any pets around you, be aware of where they are.  Review your medicines with your doctor. Some medicines can make you feel dizzy. This can increase your chance of falling. Ask your doctor what other things that you can do to help prevent falls.   This information is not intended to replace advice given to you by your health care provider. Make sure you discuss any questions you have with your health care provider.   Document Released: 01/25/2009 Document Revised: 08/15/2014 Document Reviewed: 05/05/2014 Elsevier Interactive Patient Education 2016 [place] Maintenance, Female Adopting a healthy lifestyle and getting preventive care can go a long way to promote health and wellness. Talk with your  health care provider about what schedule of regular examinations is right for you. This is a good chance for you to check in with your provider about disease prevention and staying healthy. In between checkups, there are plenty of things you can do on your own. Experts have done a lot  of research about which lifestyle changes and preventive measures are most likely to keep you healthy. Ask your health care provider for more information. WEIGHT AND DIET  Eat a healthy diet  Be sure to include plenty of vegetables, fruits, low-fat dairy products, and lean protein.  Do not eat a lot of foods high in solid fats, added sugars, or salt.  Get regular exercise. This is one of the most important things you can do for your health.  Most adults should exercise for at least 150 minutes each week. The exercise should increase your heart rate and make you sweat (moderate-intensity exercise).  Most adults should also do strengthening exercises at least twice a week. This is in addition to the moderate-intensity exercise.  Maintain a healthy weight  Body mass index (BMI) is a measurement that can be used to identify possible weight problems. It estimates body fat based on height and weight. Your health care provider can help determine your BMI and help you achieve or maintain a healthy weight.  For females 65 years of age and older:   A BMI below 18.5 is considered underweight.  A BMI of 18.5 to 24.9 is normal.  A BMI of 25 to 29.9 is considered overweight.  A BMI of 30 and above is considered obese.  Watch levels of cholesterol and blood lipids  You should start having your blood tested for lipids and cholesterol at 80 years of age, then have this test every 5 years.  You may need to have your cholesterol levels checked more often if:  Your lipid or cholesterol levels are high.  You are older than 80 years of age.  You are at high risk for heart disease.  CANCER SCREENING   Lung  Cancer  Lung cancer screening is recommended for adults 53-13 years old who are at high risk for lung cancer because of a history of smoking.  A yearly low-dose CT scan of the lungs is recommended for people who:  Currently smoke.  Have quit within the past 15 years.  Have at least a 30-pack-year history of smoking. A pack year is smoking an average of one pack of cigarettes a day for 1 year.  Yearly screening should continue until it has been 15 years since you quit.  Yearly screening should stop if you develop a health problem that would prevent you from having lung cancer treatment.  Breast Cancer  Practice breast self-awareness. This means understanding how your breasts normally appear and feel.  It also means doing regular breast self-exams. Let your health care provider know about any changes, no matter how small.  If you are in your 20s or 30s, you should have a clinical breast exam (CBE) by a health care provider every 1-3 years as part of a regular health exam.  If you are 5 or older, have a CBE every year. Also consider having a breast X-ray (mammogram) every year.  If you have a family history of breast cancer, talk to your health care provider about genetic screening.  If you are at high risk for breast cancer, talk to your health care provider about having an MRI and a mammogram every year.  Breast cancer gene (BRCA) assessment is recommended for women who have family members with BRCA-related cancers. BRCA-related cancers include:  Breast.  Ovarian.  Tubal.  Peritoneal cancers.  Results of the assessment will determine the need for genetic counseling and BRCA1 and BRCA2 testing. Cervical Cancer Your health care provider may  recommend that you be screened regularly for cancer of the pelvic organs (ovaries, uterus, and vagina). This screening involves a pelvic examination, including checking for microscopic changes to the surface of your cervix (Pap test). You  may be encouraged to have this screening done every 3 years, beginning at age 11.  For women ages 73-65, health care providers may recommend pelvic exams and Pap testing every 3 years, or they may recommend the Pap and pelvic exam, combined with testing for human papilloma virus (HPV), every 5 years. Some types of HPV increase your risk of cervical cancer. Testing for HPV may also be done on women of any age with unclear Pap test results.  Other health care providers may not recommend any screening for nonpregnant women who are considered low risk for pelvic cancer and who do not have symptoms. Ask your health care provider if a screening pelvic exam is right for you.  If you have had past treatment for cervical cancer or a condition that could lead to cancer, you need Pap tests and screening for cancer for at least 20 years after your treatment. If Pap tests have been discontinued, your risk factors (such as having a new sexual partner) need to be reassessed to determine if screening should resume. Some women have medical problems that increase the chance of getting cervical cancer. In these cases, your health care provider may recommend more frequent screening and Pap tests. Colorectal Cancer  This type of cancer can be detected and often prevented.  Routine colorectal cancer screening usually begins at 80 years of age and continues through 80 years of age.  Your health care provider may recommend screening at an earlier age if you have risk factors for colon cancer.  Your health care provider may also recommend using home test kits to check for hidden blood in the stool.  A small camera at the end of a tube can be used to examine your colon directly (sigmoidoscopy or colonoscopy). This is done to check for the earliest forms of colorectal cancer.  Routine screening usually begins at age 30.  Direct examination of the colon should be repeated every 5-10 years through 80 years of age. However,  you may need to be screened more often if early forms of precancerous polyps or small growths are found. Skin Cancer  Check your skin from head to toe regularly.  Tell your health care provider about any new moles or changes in moles, especially if there is a change in a mole's shape or color.  Also tell your health care provider if you have a mole that is larger than the size of a pencil eraser.  Always use sunscreen. Apply sunscreen liberally and repeatedly throughout the day.  Protect yourself by wearing long sleeves, pants, a wide-brimmed hat, and sunglasses whenever you are outside. HEART DISEASE, DIABETES, AND HIGH BLOOD PRESSURE   High blood pressure causes heart disease and increases the risk of stroke. High blood pressure is more likely to develop in:  People who have blood pressure in the high end of the normal range (130-139/85-89 mm Hg).  People who are overweight or obese.  People who are African American.  If you are 10-62 years of age, have your blood pressure checked every 3-5 years. If you are 26 years of age or older, have your blood pressure checked every year. You should have your blood pressure measured twice--once when you are at a hospital or clinic, and once when you are not at  a hospital or clinic. Record the average of the two measurements. To check your blood pressure when you are not at a hospital or clinic, you can use:  An automated blood pressure machine at a pharmacy.  A home blood pressure monitor.  If you are between 110 years and 73 years old, ask your health care provider if you should take aspirin to prevent strokes.  Have regular diabetes screenings. This involves taking a blood sample to check your fasting blood sugar level.  If you are at a normal weight and have a low risk for diabetes, have this test once every three years after 80 years of age.  If you are overweight and have a high risk for diabetes, consider being tested at a younger age  or more often. PREVENTING INFECTION  Hepatitis B  If you have a higher risk for hepatitis B, you should be screened for this virus. You are considered at high risk for hepatitis B if:  You were born in a country where hepatitis B is common. Ask your health care provider which countries are considered high risk.  Your parents were born in a high-risk country, and you have not been immunized against hepatitis B (hepatitis B vaccine).  You have HIV or AIDS.  You use needles to inject street drugs.  You live with someone who has hepatitis B.  You have had sex with someone who has hepatitis B.  You get hemodialysis treatment.  You take certain medicines for conditions, including cancer, organ transplantation, and autoimmune conditions. Hepatitis C  Blood testing is recommended for:  Everyone born from [birth year] through 1963.  Anyone with known risk factors for hepatitis C. Sexually transmitted infections (STIs)  You should be screened for sexually transmitted infections (STIs) including gonorrhea and chlamydia if:  You are sexually active and are younger than 80 years of age.  You are older than 80 years of age and your health care provider tells you that you are at risk for this type of infection.  Your sexual activity has changed since you were last screened and you are at an increased risk for chlamydia or gonorrhea. Ask your health care provider if you are at risk.  If you do not have HIV, but are at risk, it may be recommended that you take a prescription medicine daily to prevent HIV infection. This is called pre-exposure prophylaxis (PrEP). You are considered at risk if:  You are sexually active and do not regularly use condoms or know the HIV status of your partner(s).  You take drugs by injection.  You are sexually active with a partner who has HIV. Talk with your health care provider about whether you are at high risk of being infected with HIV. If you choose to begin  PrEP, you should first be tested for HIV. You should then be tested every 3 months for as long as you are taking PrEP.  PREGNANCY   If you are premenopausal and you may become pregnant, ask your health care provider about preconception counseling.  If you may become pregnant, take 400 to 800 micrograms (mcg) of folic acid every day.  If you want to prevent pregnancy, talk to your health care provider about birth control (contraception). OSTEOPOROSIS AND MENOPAUSE   Osteoporosis is a disease in which the bones lose minerals and strength with aging. This can result in serious bone fractures. Your risk for osteoporosis can be identified using a bone density scan.  If you are 65 years of  age or older, or if you are at risk for osteoporosis and fractures, ask your health care provider if you should be screened.  Ask your health care provider whether you should take a calcium or vitamin D supplement to lower your risk for osteoporosis.  Menopause may have certain physical symptoms and risks.  Hormone replacement therapy may reduce some of these symptoms and risks. Talk to your health care provider about whether hormone replacement therapy is right for you.  HOME CARE INSTRUCTIONS   Schedule regular health, dental, and eye exams.  Stay current with your immunizations.   Do not use any tobacco products including cigarettes, chewing tobacco, or electronic cigarettes.  If you are pregnant, do not drink alcohol.  If you are breastfeeding, limit how much and how often you drink alcohol.  Limit alcohol intake to no more than 1 drink per day for nonpregnant women. One drink equals 12 ounces of beer, 5 ounces of wine, or 1 ounces of hard liquor.  Do not use street drugs.  Do not share needles.  Ask your health care provider for help if you need support or information about quitting drugs.  Tell your health care provider if you often feel depressed.  Tell your health care provider if you  have ever been abused or do not feel safe at home.   This information is not intended to replace advice given to you by your health care provider. Make sure you discuss any questions you have with your health care provider.   Document Released: 10/14/2010 Document Revised: 04/21/2014 Document Reviewed: 03/02/2013 Elsevier Interactive Patient Education [employer].

## 2015-07-04 ENCOUNTER — Telehealth: Payer: Self-pay | Admitting: Internal Medicine

## 2015-07-04 NOTE — Telephone Encounter (Signed)
The patient has been calling [facility] regularly since he advised that he would place a referral for home health. Please follow up.

## 2015-07-05 NOTE — Telephone Encounter (Signed)
Faxed to [facility] @ [phone], they will contact pt

## 2015-07-09 DIAGNOSIS — M1990 Unspecified osteoarthritis, unspecified site: Secondary | ICD-10-CM | POA: Diagnosis not present

## 2015-07-09 DIAGNOSIS — I10 Essential (primary) hypertension: Secondary | ICD-10-CM | POA: Diagnosis not present

## 2015-07-09 DIAGNOSIS — I872 Venous insufficiency (chronic) (peripheral): Secondary | ICD-10-CM | POA: Diagnosis not present

## 2015-07-09 DIAGNOSIS — E1129 Type 2 diabetes mellitus with other diabetic kidney complication: Secondary | ICD-10-CM | POA: Diagnosis not present

## 2015-07-09 DIAGNOSIS — F419 Anxiety disorder, unspecified: Secondary | ICD-10-CM | POA: Diagnosis not present

## 2015-07-09 DIAGNOSIS — E1142 Type 2 diabetes mellitus with diabetic polyneuropathy: Secondary | ICD-10-CM | POA: Diagnosis not present

## 2015-07-11 DIAGNOSIS — M1990 Unspecified osteoarthritis, unspecified site: Secondary | ICD-10-CM | POA: Diagnosis not present

## 2015-07-11 DIAGNOSIS — I872 Venous insufficiency (chronic) (peripheral): Secondary | ICD-10-CM | POA: Diagnosis not present

## 2015-07-11 DIAGNOSIS — E1142 Type 2 diabetes mellitus with diabetic polyneuropathy: Secondary | ICD-10-CM | POA: Diagnosis not present

## 2015-07-11 DIAGNOSIS — I10 Essential (primary) hypertension: Secondary | ICD-10-CM | POA: Diagnosis not present

## 2015-07-11 DIAGNOSIS — F419 Anxiety disorder, unspecified: Secondary | ICD-10-CM | POA: Diagnosis not present

## 2015-07-11 DIAGNOSIS — E1129 Type 2 diabetes mellitus with other diabetic kidney complication: Secondary | ICD-10-CM | POA: Diagnosis not present

## 2015-07-13 DIAGNOSIS — F419 Anxiety disorder, unspecified: Secondary | ICD-10-CM | POA: Diagnosis not present

## 2015-07-13 DIAGNOSIS — M1990 Unspecified osteoarthritis, unspecified site: Secondary | ICD-10-CM | POA: Diagnosis not present

## 2015-07-13 DIAGNOSIS — I872 Venous insufficiency (chronic) (peripheral): Secondary | ICD-10-CM | POA: Diagnosis not present

## 2015-07-13 DIAGNOSIS — I10 Essential (primary) hypertension: Secondary | ICD-10-CM | POA: Diagnosis not present

## 2015-07-13 DIAGNOSIS — E1142 Type 2 diabetes mellitus with diabetic polyneuropathy: Secondary | ICD-10-CM | POA: Diagnosis not present

## 2015-07-13 DIAGNOSIS — E1129 Type 2 diabetes mellitus with other diabetic kidney complication: Secondary | ICD-10-CM | POA: Diagnosis not present

## 2015-07-16 DIAGNOSIS — I10 Essential (primary) hypertension: Secondary | ICD-10-CM | POA: Diagnosis not present

## 2015-07-16 DIAGNOSIS — E1129 Type 2 diabetes mellitus with other diabetic kidney complication: Secondary | ICD-10-CM | POA: Diagnosis not present

## 2015-07-16 DIAGNOSIS — I872 Venous insufficiency (chronic) (peripheral): Secondary | ICD-10-CM | POA: Diagnosis not present

## 2015-07-16 DIAGNOSIS — E1142 Type 2 diabetes mellitus with diabetic polyneuropathy: Secondary | ICD-10-CM | POA: Diagnosis not present

## 2015-07-16 DIAGNOSIS — M1990 Unspecified osteoarthritis, unspecified site: Secondary | ICD-10-CM | POA: Diagnosis not present

## 2015-07-16 DIAGNOSIS — F419 Anxiety disorder, unspecified: Secondary | ICD-10-CM | POA: Diagnosis not present

## 2015-07-19 DIAGNOSIS — I872 Venous insufficiency (chronic) (peripheral): Secondary | ICD-10-CM | POA: Diagnosis not present

## 2015-07-19 DIAGNOSIS — F419 Anxiety disorder, unspecified: Secondary | ICD-10-CM | POA: Diagnosis not present

## 2015-07-19 DIAGNOSIS — E1142 Type 2 diabetes mellitus with diabetic polyneuropathy: Secondary | ICD-10-CM | POA: Diagnosis not present

## 2015-07-19 DIAGNOSIS — I10 Essential (primary) hypertension: Secondary | ICD-10-CM | POA: Diagnosis not present

## 2015-07-19 DIAGNOSIS — M1990 Unspecified osteoarthritis, unspecified site: Secondary | ICD-10-CM | POA: Diagnosis not present

## 2015-07-19 DIAGNOSIS — E1129 Type 2 diabetes mellitus with other diabetic kidney complication: Secondary | ICD-10-CM | POA: Diagnosis not present

## 2015-07-23 DIAGNOSIS — M1990 Unspecified osteoarthritis, unspecified site: Secondary | ICD-10-CM | POA: Diagnosis not present

## 2015-07-23 DIAGNOSIS — E1129 Type 2 diabetes mellitus with other diabetic kidney complication: Secondary | ICD-10-CM | POA: Diagnosis not present

## 2015-07-23 DIAGNOSIS — I10 Essential (primary) hypertension: Secondary | ICD-10-CM | POA: Diagnosis not present

## 2015-07-23 DIAGNOSIS — F419 Anxiety disorder, unspecified: Secondary | ICD-10-CM | POA: Diagnosis not present

## 2015-07-23 DIAGNOSIS — I872 Venous insufficiency (chronic) (peripheral): Secondary | ICD-10-CM | POA: Diagnosis not present

## 2015-07-23 DIAGNOSIS — E1142 Type 2 diabetes mellitus with diabetic polyneuropathy: Secondary | ICD-10-CM | POA: Diagnosis not present

## 2015-07-25 DIAGNOSIS — M1990 Unspecified osteoarthritis, unspecified site: Secondary | ICD-10-CM | POA: Diagnosis not present

## 2015-07-26 ENCOUNTER — Telehealth: Payer: Self-pay

## 2015-07-26 DIAGNOSIS — F419 Anxiety disorder, unspecified: Secondary | ICD-10-CM | POA: Diagnosis not present

## 2015-07-26 DIAGNOSIS — I10 Essential (primary) hypertension: Secondary | ICD-10-CM | POA: Diagnosis not present

## 2015-07-26 DIAGNOSIS — M1990 Unspecified osteoarthritis, unspecified site: Secondary | ICD-10-CM | POA: Diagnosis not present

## 2015-07-26 DIAGNOSIS — I872 Venous insufficiency (chronic) (peripheral): Secondary | ICD-10-CM | POA: Diagnosis not present

## 2015-07-26 DIAGNOSIS — E1142 Type 2 diabetes mellitus with diabetic polyneuropathy: Secondary | ICD-10-CM | POA: Diagnosis not present

## 2015-07-26 DIAGNOSIS — E1129 Type 2 diabetes mellitus with other diabetic kidney complication: Secondary | ICD-10-CM | POA: Diagnosis not present

## 2015-07-26 NOTE — Telephone Encounter (Signed)
Home Health Cert/Plan of Care ([id number] - 09/06/2015) received signed.  Faxed, copy sen to scan

## 2015-07-30 DIAGNOSIS — I10 Essential (primary) hypertension: Secondary | ICD-10-CM | POA: Diagnosis not present

## 2015-07-30 DIAGNOSIS — E1129 Type 2 diabetes mellitus with other diabetic kidney complication: Secondary | ICD-10-CM | POA: Diagnosis not present

## 2015-07-30 DIAGNOSIS — F419 Anxiety disorder, unspecified: Secondary | ICD-10-CM | POA: Diagnosis not present

## 2015-07-30 DIAGNOSIS — I872 Venous insufficiency (chronic) (peripheral): Secondary | ICD-10-CM | POA: Diagnosis not present

## 2015-07-30 DIAGNOSIS — M1990 Unspecified osteoarthritis, unspecified site: Secondary | ICD-10-CM | POA: Diagnosis not present

## 2015-07-30 DIAGNOSIS — E1142 Type 2 diabetes mellitus with diabetic polyneuropathy: Secondary | ICD-10-CM | POA: Diagnosis not present

## 2015-08-02 DIAGNOSIS — I872 Venous insufficiency (chronic) (peripheral): Secondary | ICD-10-CM | POA: Diagnosis not present

## 2015-08-02 DIAGNOSIS — F419 Anxiety disorder, unspecified: Secondary | ICD-10-CM | POA: Diagnosis not present

## 2015-08-02 DIAGNOSIS — M1990 Unspecified osteoarthritis, unspecified site: Secondary | ICD-10-CM | POA: Diagnosis not present

## 2015-08-02 DIAGNOSIS — E1142 Type 2 diabetes mellitus with diabetic polyneuropathy: Secondary | ICD-10-CM | POA: Diagnosis not present

## 2015-08-02 DIAGNOSIS — I10 Essential (primary) hypertension: Secondary | ICD-10-CM | POA: Diagnosis not present

## 2015-08-02 DIAGNOSIS — E1129 Type 2 diabetes mellitus with other diabetic kidney complication: Secondary | ICD-10-CM | POA: Diagnosis not present

## 2015-08-13 DIAGNOSIS — E1129 Type 2 diabetes mellitus with other diabetic kidney complication: Secondary | ICD-10-CM | POA: Diagnosis not present

## 2015-08-13 DIAGNOSIS — I10 Essential (primary) hypertension: Secondary | ICD-10-CM | POA: Diagnosis not present

## 2015-08-13 DIAGNOSIS — E1142 Type 2 diabetes mellitus with diabetic polyneuropathy: Secondary | ICD-10-CM | POA: Diagnosis not present

## 2015-08-13 DIAGNOSIS — F419 Anxiety disorder, unspecified: Secondary | ICD-10-CM | POA: Diagnosis not present

## 2015-08-13 DIAGNOSIS — M1990 Unspecified osteoarthritis, unspecified site: Secondary | ICD-10-CM | POA: Diagnosis not present

## 2015-08-13 DIAGNOSIS — I872 Venous insufficiency (chronic) (peripheral): Secondary | ICD-10-CM | POA: Diagnosis not present

## 2015-08-15 ENCOUNTER — Ambulatory Visit (INDEPENDENT_AMBULATORY_CARE_PROVIDER_SITE_OTHER): Payer: MEDICARE | Admitting: *Deleted

## 2015-08-15 DIAGNOSIS — I495 Sick sinus syndrome: Secondary | ICD-10-CM | POA: Diagnosis not present

## 2015-08-16 ENCOUNTER — Ambulatory Visit (INDEPENDENT_AMBULATORY_CARE_PROVIDER_SITE_OTHER)
Admission: RE | Admit: 2015-08-16 | Discharge: 2015-08-16 | Disposition: A | Discharge status: Home / Self Care | Payer: MEDICARE | Source: Ambulatory Visit | Attending: Family | Admitting: Family

## 2015-08-16 ENCOUNTER — Encounter: Payer: Self-pay | Admitting: Family

## 2015-08-16 ENCOUNTER — Ambulatory Visit (INDEPENDENT_AMBULATORY_CARE_PROVIDER_SITE_OTHER): Payer: MEDICARE | Admitting: Family

## 2015-08-16 ENCOUNTER — Other Ambulatory Visit: Payer: Self-pay | Admitting: Family

## 2015-08-16 VITALS — BP 100/48 | HR 61 | Temp 97.9°F | Ht 65.0 in | Wt 149.0 lb

## 2015-08-16 DIAGNOSIS — R918 Other nonspecific abnormal finding of lung field: Secondary | ICD-10-CM

## 2015-08-16 DIAGNOSIS — I6521 Occlusion and stenosis of right carotid artery: Secondary | ICD-10-CM

## 2015-08-16 DIAGNOSIS — R059 Cough, unspecified: Secondary | ICD-10-CM

## 2015-08-16 DIAGNOSIS — I959 Hypotension, unspecified: Secondary | ICD-10-CM

## 2015-08-16 DIAGNOSIS — R05 Cough: Secondary | ICD-10-CM

## 2015-08-16 MED ORDER — BENZONATATE 100 MG PO CAPS: 100.0000 mg | ORAL_CAPSULE | Freq: Three times a day (TID) | ORAL | Status: DC | PRN

## 2015-08-16 MED ORDER — ALBUTEROL SULFATE HFA 108 (90 BASE) MCG/ACT IN AERS: 2.0000 | INHALATION_SPRAY | Freq: Four times a day (QID) | RESPIRATORY_TRACT | Status: DC | PRN

## 2015-08-16 MED ORDER — ALBUTEROL SULFATE (2.5 MG/3ML) 0.083% IN NEBU
5.0000 mg | INHALATION_SOLUTION | Freq: Once | RESPIRATORY_TRACT | Status: AC
  Administered 2015-08-16: 5 mg via RESPIRATORY_TRACT

## 2015-08-16 MED ORDER — AZITHROMYCIN 250 MG PO TABS: ORAL_TABLET | ORAL | Status: DC

## 2015-08-16 NOTE — Progress Notes (Signed)
Remote pacemaker transmission.   

## 2015-08-16 NOTE — Progress Notes (Signed)
Subjective:    Patient ID: Kayla Davis, female    DOB: September 27, 1922, 82 y.o.   MRN: [id number]   Kayla Davis is a 82 y.o. female who presents today for an acute visit.    Cough This is a new problem. The current episode started in the past 7 days. The problem has been gradually worsening. The problem occurs every few hours. The cough is productive of sputum. Associated symptoms include myalgias, rhinorrhea and a sore throat. Pertinent negatives include no chills, fever, headaches, shortness of breath or wheezing. The symptoms are aggravated by lying down. Treatments tried: mucinex, flonase. The treatment provided mild relief. Her past medical history is significant for bronchitis and pneumonia.   Past Medical History  Diagnosis Date  . Hypertension   . Hyperlipidemia   . Aortic valve disorders   . Unspecified hereditary and idiopathic peripheral neuropathy   . Other abnormal glucose   . Unspecified hearing loss   . Unspecified chronic bronchitis ([place])   . Unspecified hypothyroidism   . Unspecified gastritis and gastroduodenitis without mention of hemorrhage   . Diverticulosis of colon (without mention of hemorrhage)   . Irritable bowel syndrome   . Osteoarthrosis, unspecified whether generalized or localized, unspecified site   . Backache, unspecified   . Pain in limb   . Osteoporosis, unspecified   . Herpes zoster with other nervous system complications(053.19)   . Anxiety state, unspecified   . Anemia, unspecified   . Bradycardia     s/p PPM  . Complication of anesthesia     has had difficulty awakening in past; pt states did well with the anesthesia given during her carotid surgery   . TIA (transient ischemic attack)   . Heart murmur   . History of shingles   . Presence of permanent cardiac pacemaker   . Peripheral vascular disease ([place])   . Hemorrhoids   . Varicose veins   . Urinary incontinence   . Bursitis   . Menopause   . History of frequent urinary  tract infections   . History of measles as a child   . History of mumps as a child    Allergies: Penicillins; Alprazolam; Bactrim; Ceclor; Cephalexin; Ciprofloxacin; Clindamycin/lincomycin; Hydrocodone; Noroxin; Sulfonamide derivatives; Tetracyclines & related; and Tramadol Current Outpatient Prescriptions on File Prior to Visit  Medication Sig Dispense Refill  . aspirin 81 MG tablet Take 81 mg by mouth daily.    . fluticasone (FLONASE ALLERGY RELIEF) 50 MCG/ACT nasal spray Place into both nostrils daily.    [name] glucose blood (ONE TOUCH TEST STRIPS) test strip 1 each by Other route 2 (two) times daily. Use as instructed 100 each 11  . hydroxypropyl methylcellulose / hypromellose (ISOPTO TEARS / GONIOVISC) 2.5 % ophthalmic solution Place 1 drop into both eyes 2 (two) times daily. Reported on 05/31/2015    . Lancets (ONETOUCH ULTRASOFT) lancets Use as instructed 100 each 12  . levothyroxine (SYNTHROID) 125 MCG tablet Take 1 tablet (125 mcg total) by mouth daily before breakfast. 90 tablet 3  . loratadine (CLARITIN) 10 MG tablet Take 10 mg by mouth daily as needed for allergies.     . polyethylene glycol (MIRALAX / GLYCOLAX) packet Take 17 g by mouth daily as needed.    . pregabalin (LYRICA) 75 MG capsule Take 75 mg by mouth at bedtime.    . simvastatin (ZOCOR) 40 MG tablet Take 1 tablet (40 mg total) by mouth daily with supper. 90 tablet 0  . sodium  chloride (OCEAN) 0.65 % SOLN nasal spray Place 1 spray into both nostrils 2 (two) times daily. Reported on 05/31/2015    . [DISCONTINUED] valsartan-hydrochlorothiazide (DIOVAN-HCT) 80-12.5 MG per tablet TAKE 1 TABLET BY MOUTH DAILY 90 tablet 3   No current facility-administered medications on file prior to visit.    Social History  Substance Use Topics  . Smoking status: Never Smoker   . Smokeless tobacco: Never Used  . Alcohol Use: No    Review of Systems  Constitutional: Negative for fever and chills.  HENT: Positive for congestion, rhinorrhea  and sore throat.   Respiratory: Positive for cough. Negative for shortness of breath and wheezing.   Musculoskeletal: Positive for myalgias.  Neurological: Negative for headaches.      Objective:    BP 100/48 mmHg  Pulse 61  Temp(Src) 97.9 F (36.6 C) (Oral)  Ht 5\' 5"  (1.651 m)  Wt 149 lb (67.586 kg)  BMI 24.79 kg/m2  SpO2 97%   Physical Exam  Constitutional: She appears well-developed and well-nourished.  HENT:  Head: Normocephalic and atraumatic.  Right Ear: Hearing, tympanic membrane, external ear and ear canal normal. No drainage, swelling or tenderness. No foreign bodies. Tympanic membrane is not erythematous and not bulging. No middle ear effusion. No decreased hearing is noted.  Left Ear: Hearing, tympanic membrane, external ear and ear canal normal. No drainage, swelling or tenderness. No foreign bodies. Tympanic membrane is not erythematous and not bulging.  No middle ear effusion. No decreased hearing is noted.  Nose: Nose normal. No rhinorrhea. Right sinus exhibits no maxillary sinus tenderness and no frontal sinus tenderness. Left sinus exhibits no maxillary sinus tenderness and no frontal sinus tenderness.  Mouth/Throat: Uvula is midline, oropharynx is clear and moist and mucous membranes are normal. No oropharyngeal exudate, posterior oropharyngeal edema, posterior oropharyngeal erythema or tonsillar abscesses.  Eyes: Conjunctivae are normal.  Cardiovascular: Regular rhythm, normal heart sounds and normal pulses.   Pulmonary/Chest: Effort normal. She has decreased breath sounds in the right lower field and the left lower field. She has no wheezes. She has no rhonchi. She has no rales.  Lymphadenopathy:       Head (right side): No submental, no submandibular, no tonsillar, no preauricular, no posterior auricular and no occipital adenopathy present.       Head (left side): No submental, no submandibular, no tonsillar, no preauricular, no posterior auricular and no occipital  adenopathy present.    She has no cervical adenopathy.  Neurological: She is alert.  Skin: Skin is warm and dry.  Psychiatric: She has a normal mood and affect. Her speech is normal and behavior is normal. Thought content normal.  Vitals reviewed.    Patient felt significantly better after albuterol treatment. Lung sounds clear and increased  Assessment & Plan:  1. Cough Bronchitis v PNA. Pennding chest x-ray at this time.Patient is afebrile and is in no acute respiratory distress.  -Albuterol inhaler -Tessalon  2. Low blood pressure Initial blood pressure systolic was less than [id number]. When taking blood pressure for second time, it did improve, although not by much. Reassured that patient denies feeling dizziness from medication. Discontinued losartan-HCTZ. She is to keep a BP log at home and schedule for pressure check for Monday.  I have discontinued Ms. Tolleson's sodium chloride irrigation, ipratropium, losartan-hydrochlorothiazide, methocarbamol, and rivaroxaban. I am also having her start on benzonatate and albuterol. Additionally, I am having her maintain her sodium chloride, onetouch ultrasoft, loratadine, pregabalin, hydroxypropyl methylcellulose / hypromellose, polyethylene glycol, levothyroxine,  simvastatin, glucose blood, fluticasone, and aspirin. We administered albuterol.   Meds ordered this encounter  Medications  . benzonatate (TESSALON PERLES) 100 MG capsule    Sig: Take 1 capsule (100 mg total) by mouth 3 (three) times daily as needed for cough.    Dispense:  30 capsule    Refill:  1    Order Specific Question:  Supervising Provider    Answer:  [name] [[id number]]  . albuterol (PROVENTIL HFA) 108 (90 Base) MCG/ACT inhaler    Sig: Inhale 2 puffs into the lungs every 6 (six) hours as needed for wheezing or shortness of breath.    Dispense:  1 Inhaler    Refill:  1    Order Specific Question:  Supervising Provider    Answer:  [name] [[id number]]  .  albuterol (PROVENTIL) (2.5 MG/3ML) 0.083% nebulizer solution 5 mg    Sig:      Start medications as prescribed and explained to patient on After Visit Summary ( AVS). Risks, benefits, and alternatives of the medications and treatment plan prescribed today were discussed, and patient expressed understanding.   Education regarding symptom management and diagnosis given to patient.   Follow-up:Plan follow-up as discussed or as needed if any worsening symptoms or change in condition. No Follow-up on file. BP check Monday 5/8.  Continue to follow with [name], MD for routine health maintenance.   [name] and I agreed with plan.   Mable Paris, FNP

## 2015-08-16 NOTE — Patient Instructions (Addendum)
  Schedule BP check on Monday. Keep blood pressure log at home. Goal < 140/90.   Chest x-ray downstairs.  Please take cough medication at night only as needed. As we discussed, I do not recommend dosing throughout the day as coughing is a protective mechanism . It also helps to break up thick mucous.  Do not take cough suppressants with alcohol as can lead to trouble breathing. Advise caution if taking cough suppressant and operating machinery ( i.e driving a car) as you may feel very tired.   Use albuterol every 6 hours for first 24 hours to get good medication into the lungs and loosen congestion; after, you may use as needed and eventually stop all together when cough resolves.   Increase intake of clear fluids. Congestion is best treated by hydration, when mucus is wetter, it is thinner, less sticky, and easier to expel from the body, either through coughing up drainage, or by blowing your nose.   Get plenty of rest.   Use saline nasal drops and blow your nose frequently. Run a humidifier at night and elevate the head of the bed. Vicks Vapor rub will help with congestion and cough. Steam showers and sinus massage for congestion.   Use Acetaminophen or Ibuprofen as needed for fever or pain. Avoid second hand smoke. Even the smallest exposure will worsen symptoms.   Over the counter medications you can try include Delsym for cough, a decongestant for congestion, and Mucinex or Robitussin as an expectorant. Be sure to just get the plain Mucinex or Robitussin that just has one medication (Guaifenesen). We don't recommend the combination products. Note, be sure to drink two glasses of water with each dose of Mucinex as the medication will not work well without adequate hydration.   You can also try a teaspoon of honey to see if this will help reduce cough. Throat lozenges can sometimes be beneficial as well.    This illness will typically last 7 - 10 days.   Please follow up with our clinic if  you develop a fever greater than 101 F, symptoms worsen, or do not resolve in the next week.

## 2015-08-16 NOTE — Progress Notes (Signed)
Pre visit review using our clinic review tool, if applicable. No additional management support is needed unless otherwise documented below in the visit note. 

## 2015-08-17 ENCOUNTER — Telehealth: Payer: Self-pay | Admitting: Internal Medicine

## 2015-08-17 DIAGNOSIS — E1129 Type 2 diabetes mellitus with other diabetic kidney complication: Secondary | ICD-10-CM | POA: Diagnosis not present

## 2015-08-17 DIAGNOSIS — F419 Anxiety disorder, unspecified: Secondary | ICD-10-CM | POA: Diagnosis not present

## 2015-08-17 DIAGNOSIS — I872 Venous insufficiency (chronic) (peripheral): Secondary | ICD-10-CM | POA: Diagnosis not present

## 2015-08-17 DIAGNOSIS — E1142 Type 2 diabetes mellitus with diabetic polyneuropathy: Secondary | ICD-10-CM | POA: Diagnosis not present

## 2015-08-17 DIAGNOSIS — I10 Essential (primary) hypertension: Secondary | ICD-10-CM | POA: Diagnosis not present

## 2015-08-17 DIAGNOSIS — M1990 Unspecified osteoarthritis, unspecified site: Secondary | ICD-10-CM | POA: Diagnosis not present

## 2015-08-17 NOTE — Telephone Encounter (Signed)
Pt called in and would like someone to call her back about her xray results today.

## 2015-08-17 NOTE — Telephone Encounter (Signed)
Pt informed of below.   Notes Recorded by Burnard Hawthorne, FNP on 08/16/2015 at 4:51 PM Please let patient know that recent tests were ABNORMAL with the following advice   See routing note which includes antibiotic sent to pharmacy, and additional lab work.  If unable to reach patient after 3 attempts, please let me know.   Thanks as always!  margaret

## 2015-08-20 ENCOUNTER — Telehealth: Payer: Self-pay

## 2015-08-20 ENCOUNTER — Other Ambulatory Visit: Payer: Self-pay | Admitting: Family

## 2015-08-20 ENCOUNTER — Ambulatory Visit: Payer: MEDICARE

## 2015-08-20 ENCOUNTER — Other Ambulatory Visit (INDEPENDENT_AMBULATORY_CARE_PROVIDER_SITE_OTHER): Payer: MEDICARE

## 2015-08-20 VITALS — BP 110/58

## 2015-08-20 DIAGNOSIS — I959 Hypotension, unspecified: Secondary | ICD-10-CM

## 2015-08-20 DIAGNOSIS — R3589 Other polyuria: Secondary | ICD-10-CM

## 2015-08-20 DIAGNOSIS — R350 Frequency of micturition: Secondary | ICD-10-CM

## 2015-08-20 DIAGNOSIS — R358 Other polyuria: Secondary | ICD-10-CM

## 2015-08-20 DIAGNOSIS — R05 Cough: Secondary | ICD-10-CM | POA: Diagnosis not present

## 2015-08-20 DIAGNOSIS — R059 Cough, unspecified: Secondary | ICD-10-CM

## 2015-08-20 LAB — CBC WITH DIFFERENTIAL/PLATELET
BASOS PCT: 0.9 % (ref 0.0–3.0)
Basophils Absolute: 0.1 10*3/uL (ref 0.0–0.1)
EOS PCT: 5.8 % — AB (ref 0.0–5.0)
Eosinophils Absolute: 0.4 10*3/uL (ref 0.0–0.7)
HEMATOCRIT: 35.9 % — AB (ref 36.0–46.0)
HEMOGLOBIN: 12 g/dL (ref 12.0–15.0)
LYMPHS PCT: 37.1 % (ref 12.0–46.0)
Lymphs Abs: 2.4 10*3/uL (ref 0.7–4.0)
MCHC: 33.5 g/dL (ref 30.0–36.0)
MCV: 90.5 fl (ref 78.0–100.0)
MONO ABS: 0.5 10*3/uL (ref 0.1–1.0)
MONOS PCT: 7.9 % (ref 3.0–12.0)
Neutro Abs: 3.1 10*3/uL (ref 1.4–7.7)
Neutrophils Relative %: 48.3 % (ref 43.0–77.0)
Platelets: 205 10*3/uL (ref 150.0–400.0)
RBC: 3.96 Mil/uL (ref 3.87–5.11)
RDW: 13.9 % (ref 11.5–15.5)
WBC: 6.4 10*3/uL (ref 4.0–10.5)

## 2015-08-20 LAB — BASIC METABOLIC PANEL
BUN: 22 mg/dL (ref 6–23)
CALCIUM: 9.7 mg/dL (ref 8.4–10.5)
CO2: 28 meq/L (ref 19–32)
CREATININE: 0.9 mg/dL (ref 0.40–1.20)
Chloride: 102 mEq/L (ref 96–112)
GFR: 62.42 mL/min (ref >60.00)
GLUCOSE: 189 mg/dL — AB (ref 70–99)
Potassium: 4.9 mEq/L (ref 3.5–5.1)
Sodium: 136 mEq/L (ref 135–145)

## 2015-08-20 LAB — URINALYSIS, ROUTINE W REFLEX MICROSCOPIC
BILIRUBIN URINE: NEGATIVE
NITRITE: POSITIVE — AB
Specific Gravity, Urine: 1.02 (ref 1.000–1.030)
Total Protein, Urine: NEGATIVE
Urine Glucose: NEGATIVE
Urobilinogen, UA: 0.2 (ref 0.0–1.0)
pH: 6 (ref 5.0–8.0)

## 2015-08-20 MED ORDER — LOSARTAN POTASSIUM-HCTZ 50-12.5 MG PO TABS: 0.5000 | ORAL_TABLET | Freq: Every day | ORAL | Status: DC

## 2015-08-20 NOTE — Telephone Encounter (Signed)
LVM for pt to call back as soon as possible.   

## 2015-08-20 NOTE — Telephone Encounter (Signed)
-----   Message from Burnard Hawthorne, [place] sent at 08/20/2015  8:53 AM EDT ----- Can we call her about BP? We discontinued her med and want to ensure that her BP is not elevated.   Also, there are additional labs pended in system. When will she come to get them done?   Thanks!

## 2015-08-20 NOTE — Telephone Encounter (Signed)
Patient called back.  She will have labs drawn after nurse appt

## 2015-08-21 ENCOUNTER — Telehealth: Payer: Self-pay | Admitting: Family

## 2015-08-21 ENCOUNTER — Other Ambulatory Visit: Payer: Self-pay | Admitting: Family

## 2015-08-21 DIAGNOSIS — I872 Venous insufficiency (chronic) (peripheral): Secondary | ICD-10-CM | POA: Diagnosis not present

## 2015-08-21 DIAGNOSIS — N390 Urinary tract infection, site not specified: Secondary | ICD-10-CM

## 2015-08-21 DIAGNOSIS — I10 Essential (primary) hypertension: Secondary | ICD-10-CM | POA: Diagnosis not present

## 2015-08-21 DIAGNOSIS — M1990 Unspecified osteoarthritis, unspecified site: Secondary | ICD-10-CM | POA: Diagnosis not present

## 2015-08-21 DIAGNOSIS — E1129 Type 2 diabetes mellitus with other diabetic kidney complication: Secondary | ICD-10-CM | POA: Diagnosis not present

## 2015-08-21 DIAGNOSIS — E1142 Type 2 diabetes mellitus with diabetic polyneuropathy: Secondary | ICD-10-CM | POA: Diagnosis not present

## 2015-08-21 DIAGNOSIS — F419 Anxiety disorder, unspecified: Secondary | ICD-10-CM | POA: Diagnosis not present

## 2015-08-21 LAB — PRO B NATRIURETIC PEPTIDE: Pro B Natriuretic peptide (BNP): 391.2 pg/mL (ref <451)

## 2015-08-21 MED ORDER — NITROFURANTOIN MONOHYD MACRO 100 MG PO CAPS: 100.0000 mg | ORAL_CAPSULE | Freq: Two times a day (BID) | ORAL | Status: DC

## 2015-08-21 NOTE — Telephone Encounter (Signed)
Please let patient know I have sent Macrobid to pharmacy. We are pending urine culture however last August she was treated for E. Coli with Macrobid.   Her lab work came back normal. No electrolyte abnormality and Pro BNP WNL. Glucose is elevated.   She needs to continue to monitor her BP now that she is back on losartan- HCTZ.   Does she want to come back for BP check with RN? Her next appt with PCP isn't until July.

## 2015-08-21 NOTE — Telephone Encounter (Signed)
LVM for pt to call back as soon as possible.   

## 2015-08-21 NOTE — Telephone Encounter (Signed)
-----   Message from Burnard Hawthorne, [place] sent at 08/21/2015  8:44 AM EDT ----- One more thing.[name] How is patient's cough?   When I looked at the Pro BNP with different lab criteria, it does appear to be elevated which would be suggestive of congestive heart failure based on her recent CXR which also showed bilateral edema.   How is her cough?   It appears she has a cardiologist and hasnt been seen in a while. Can we make an appointment for her or message her cardiologist???      ----- Message -----    From: [facility]    Sent: 08/20/2015   4:01 PM      To: Burnard Hawthorne, FNP

## 2015-08-22 NOTE — Telephone Encounter (Signed)
i have explained lab results to patient, she will go to pharm to get macrobid---her cough is no worse---i am routing to Kayla Davis to ask how soon i need to schedule patient with dr allred/cardiologist---Kayla Davis will be making appt for patient, her daughter can carry her any afternoon after 2pm---is this urgent for cardiologist?----Kayla Davis will call patient back with appt time for cardiologist--patient is elderly and wanted someone else to make appt for her, she gets confused

## 2015-08-23 ENCOUNTER — Telehealth: Payer: Self-pay

## 2015-08-23 DIAGNOSIS — F419 Anxiety disorder, unspecified: Secondary | ICD-10-CM | POA: Diagnosis not present

## 2015-08-23 DIAGNOSIS — E1129 Type 2 diabetes mellitus with other diabetic kidney complication: Secondary | ICD-10-CM | POA: Diagnosis not present

## 2015-08-23 DIAGNOSIS — I10 Essential (primary) hypertension: Secondary | ICD-10-CM | POA: Diagnosis not present

## 2015-08-23 DIAGNOSIS — I872 Venous insufficiency (chronic) (peripheral): Secondary | ICD-10-CM | POA: Diagnosis not present

## 2015-08-23 DIAGNOSIS — M1990 Unspecified osteoarthritis, unspecified site: Secondary | ICD-10-CM | POA: Diagnosis not present

## 2015-08-23 DIAGNOSIS — E1142 Type 2 diabetes mellitus with diabetic polyneuropathy: Secondary | ICD-10-CM | POA: Diagnosis not present

## 2015-08-23 NOTE — Telephone Encounter (Signed)
i left message advising patient that appt has been made for her with dr allred/cardiology---on [address]----may 31st at 2:30---advised patient to call dr Bonita Quin office or our office if this is not a good time for her daughter to carry her---patient should also call back if her cough worsens and to completed antibiotics sent to her pharm

## 2015-08-23 NOTE — Telephone Encounter (Signed)
Thanks.  Please see if we can get her in earlier to cardio; concerned she has CHF which is contributing to cough.

## 2015-08-27 DIAGNOSIS — F419 Anxiety disorder, unspecified: Secondary | ICD-10-CM | POA: Diagnosis not present

## 2015-08-27 DIAGNOSIS — I872 Venous insufficiency (chronic) (peripheral): Secondary | ICD-10-CM | POA: Diagnosis not present

## 2015-08-27 DIAGNOSIS — M1990 Unspecified osteoarthritis, unspecified site: Secondary | ICD-10-CM | POA: Diagnosis not present

## 2015-08-27 DIAGNOSIS — I10 Essential (primary) hypertension: Secondary | ICD-10-CM | POA: Diagnosis not present

## 2015-08-27 DIAGNOSIS — E1129 Type 2 diabetes mellitus with other diabetic kidney complication: Secondary | ICD-10-CM | POA: Diagnosis not present

## 2015-08-27 DIAGNOSIS — E1142 Type 2 diabetes mellitus with diabetic polyneuropathy: Secondary | ICD-10-CM | POA: Diagnosis not present

## 2015-08-30 DIAGNOSIS — E1129 Type 2 diabetes mellitus with other diabetic kidney complication: Secondary | ICD-10-CM | POA: Diagnosis not present

## 2015-08-30 DIAGNOSIS — I10 Essential (primary) hypertension: Secondary | ICD-10-CM | POA: Diagnosis not present

## 2015-08-30 DIAGNOSIS — M1990 Unspecified osteoarthritis, unspecified site: Secondary | ICD-10-CM | POA: Diagnosis not present

## 2015-08-30 DIAGNOSIS — E1142 Type 2 diabetes mellitus with diabetic polyneuropathy: Secondary | ICD-10-CM | POA: Diagnosis not present

## 2015-08-30 DIAGNOSIS — F419 Anxiety disorder, unspecified: Secondary | ICD-10-CM | POA: Diagnosis not present

## 2015-08-30 DIAGNOSIS — I872 Venous insufficiency (chronic) (peripheral): Secondary | ICD-10-CM | POA: Diagnosis not present

## 2015-09-03 DIAGNOSIS — E1142 Type 2 diabetes mellitus with diabetic polyneuropathy: Secondary | ICD-10-CM | POA: Diagnosis not present

## 2015-09-03 DIAGNOSIS — I872 Venous insufficiency (chronic) (peripheral): Secondary | ICD-10-CM | POA: Diagnosis not present

## 2015-09-03 DIAGNOSIS — E1129 Type 2 diabetes mellitus with other diabetic kidney complication: Secondary | ICD-10-CM | POA: Diagnosis not present

## 2015-09-03 DIAGNOSIS — F419 Anxiety disorder, unspecified: Secondary | ICD-10-CM | POA: Diagnosis not present

## 2015-09-03 DIAGNOSIS — I10 Essential (primary) hypertension: Secondary | ICD-10-CM | POA: Diagnosis not present

## 2015-09-03 DIAGNOSIS — M1990 Unspecified osteoarthritis, unspecified site: Secondary | ICD-10-CM | POA: Diagnosis not present

## 2015-09-05 DIAGNOSIS — I10 Essential (primary) hypertension: Secondary | ICD-10-CM | POA: Diagnosis not present

## 2015-09-05 DIAGNOSIS — M1990 Unspecified osteoarthritis, unspecified site: Secondary | ICD-10-CM | POA: Diagnosis not present

## 2015-09-05 DIAGNOSIS — F419 Anxiety disorder, unspecified: Secondary | ICD-10-CM | POA: Diagnosis not present

## 2015-09-05 DIAGNOSIS — E1142 Type 2 diabetes mellitus with diabetic polyneuropathy: Secondary | ICD-10-CM | POA: Diagnosis not present

## 2015-09-05 DIAGNOSIS — I872 Venous insufficiency (chronic) (peripheral): Secondary | ICD-10-CM | POA: Diagnosis not present

## 2015-09-05 DIAGNOSIS — E1129 Type 2 diabetes mellitus with other diabetic kidney complication: Secondary | ICD-10-CM | POA: Diagnosis not present

## 2015-09-12 ENCOUNTER — Encounter: Payer: Self-pay | Admitting: Internal Medicine

## 2015-09-12 ENCOUNTER — Ambulatory Visit (INDEPENDENT_AMBULATORY_CARE_PROVIDER_SITE_OTHER): Payer: MEDICARE | Admitting: Internal Medicine

## 2015-09-12 VITALS — BP 118/56 | HR 62 | Ht 65.0 in | Wt 150.6 lb

## 2015-09-12 DIAGNOSIS — I10 Essential (primary) hypertension: Secondary | ICD-10-CM

## 2015-09-12 DIAGNOSIS — I6521 Occlusion and stenosis of right carotid artery: Secondary | ICD-10-CM | POA: Diagnosis not present

## 2015-09-12 DIAGNOSIS — I495 Sick sinus syndrome: Secondary | ICD-10-CM | POA: Diagnosis not present

## 2015-09-12 LAB — CUP PACEART INCLINIC DEVICE CHECK
Battery Remaining Longevity: 116 mo
Battery Voltage: 2.78 V
Brady Statistic AP VP Percent: 0 %
Brady Statistic AS VP Percent: 0 %
Date Time Interrogation Session: 20170531170201
Implantable Lead Implant Date: 20111031
Implantable Lead Model: 4469
Implantable Lead Serial Number: 548557
Implantable Lead Serial Number: 686076
Lead Channel Impedance Value: 496 Ohm
Lead Channel Pacing Threshold Amplitude: 0.5 V
Lead Channel Pacing Threshold Pulse Width: 0.4 ms
Lead Channel Pacing Threshold Pulse Width: 0.4 ms
Lead Channel Sensing Intrinsic Amplitude: 11.2 mV
Lead Channel Setting Pacing Amplitude: 2 V
Lead Channel Setting Pacing Amplitude: 2.5 V
Lead Channel Setting Sensing Sensitivity: 4 mV
MDC IDC LEAD IMPLANT DT: 20111031
MDC IDC LEAD LOCATION: 753859
MDC IDC LEAD LOCATION: 753860
MDC IDC MSMT BATTERY IMPEDANCE: 324 Ohm
MDC IDC MSMT LEADCHNL RA IMPEDANCE VALUE: 460 Ohm
MDC IDC MSMT LEADCHNL RA PACING THRESHOLD AMPLITUDE: 0.5 V
MDC IDC MSMT LEADCHNL RA PACING THRESHOLD PULSEWIDTH: 0.4 ms
MDC IDC MSMT LEADCHNL RA SENSING INTR AMPL: 2 mV
MDC IDC MSMT LEADCHNL RV PACING THRESHOLD AMPLITUDE: 0.75 V
MDC IDC MSMT LEADCHNL RV PACING THRESHOLD AMPLITUDE: 0.75 V
MDC IDC MSMT LEADCHNL RV PACING THRESHOLD PULSEWIDTH: 0.4 ms
MDC IDC SET LEADCHNL RV PACING PULSEWIDTH: 0.4 ms
MDC IDC STAT BRADY AP VS PERCENT: 16 %
MDC IDC STAT BRADY AS VS PERCENT: 84 %

## 2015-09-12 NOTE — Patient Instructions (Addendum)
Medication Instructions:  Your physician recommends that you continue on your current medications as directed. Please refer to the Current Medication list given to you today.  Labwork: None ordered  Testing/Procedures: None ordered  Follow-Up: Remote monitoring is used to monitor your Pacemaker of ICD from home. This monitoring reduces the number of office visits required to check your device to one time per year. It allows [place] to keep an eye on the functioning of your device to ensure it is working properly. You are scheduled for a device check from home on 12/12/2015. You may send your transmission at any time that day. If you have a wireless device, the transmission will be sent automatically. After your physician reviews your transmission, you will receive a postcard with your next transmission date.  Keep your currently scheduled follow up with Chanetta Marshall, NP in [place]  If you need a refill on your cardiac medications before your next appointment, please call your pharmacy.  Thank you for choosing [facility]!!     Any Other Special Instructions Will Be Listed Below (If Applicable).

## 2015-09-12 NOTE — Progress Notes (Signed)
PCP: [name], MD  Kayla Davis is a 82 y.o. female who presents today for routine electrophysiology followup.  She went to the beach several weeks ago.  She developed cough/ URI symptoms.  She had significant increase in sodium intake while there and did develop mild volume overload.  She is referred by primary care for evaluation today.  Since that time, her symptoms have results.  She is currently back to her baseline health state. Today, she denies symptoms of palpitations,  dizziness, presyncope, or syncope.  She has chronic pain and swelling in her L leg.  The patient is otherwise without complaint today.   Past Medical History  Diagnosis Date  . Hypertension   . Hyperlipidemia   . Aortic valve disorders   . Unspecified hereditary and idiopathic peripheral neuropathy   . Other abnormal glucose   . Unspecified hearing loss   . Unspecified chronic bronchitis ([place])   . Unspecified hypothyroidism   . Unspecified gastritis and gastroduodenitis without mention of hemorrhage   . Diverticulosis of colon (without mention of hemorrhage)   . Irritable bowel syndrome   . Osteoarthrosis, unspecified whether generalized or localized, unspecified site   . Backache, unspecified   . Pain in limb   . Osteoporosis, unspecified   . Herpes zoster with other nervous system complications(053.19)   . Anxiety state, unspecified   . Anemia, unspecified   . Bradycardia     s/p PPM  . Complication of anesthesia     has had difficulty awakening in past; pt states did well with the anesthesia given during her carotid surgery   . TIA (transient ischemic attack)   . Heart murmur   . History of shingles   . Presence of permanent cardiac pacemaker   . Peripheral vascular disease ([place])   . Hemorrhoids   . Varicose veins   . Urinary incontinence   . Bursitis   . Menopause   . History of frequent urinary tract infections   . History of measles as a child   . History of mumps as a child    Past  Surgical History  Procedure Laterality Date  . Pacemaker insertion      MDT  . Appendectomy    . Cholecystectomy    . Tonsillectomy    . Vaginal hysterectomy    . Cataract extraction, bilateral  end of June 2013  . Cataract extraction, bilateral  beginning of July 2013  . Endarterectomy Right 01/20/2014    Procedure: RIGHT CAROTID ARTERY ENDARTERECTOMY WITH DACRON PATCH ANGIOPLASTY;  Surgeon: Mal Misty, MD;  Location: [facility];  Service: Vascular;  Laterality: Right;  . Dilation and curettage of uterus    . Total hip arthroplasty Left 03/23/2015    Procedure: TOTAL HIP ARTHROPLASTY ANTERIOR APPROACH;  Surgeon: Gaynelle Arabian, MD;  Location: [facility];  Service: Orthopedics;  Laterality: Left;    Current Outpatient Prescriptions  Medication Sig Dispense Refill  . albuterol (PROVENTIL HFA) 108 (90 Base) MCG/ACT inhaler Inhale 2 puffs into the lungs every 6 (six) hours as needed for wheezing or shortness of breath. 1 Inhaler 1  . aspirin 81 MG tablet Take 81 mg by mouth daily.    . cetirizine (ZYRTEC) 10 MG tablet Take 10 mg by mouth daily as needed for allergies.    . fluticasone (FLONASE ALLERGY RELIEF) 50 MCG/ACT nasal spray Place into both nostrils daily.    [name] glucose blood (ONE TOUCH TEST STRIPS) test strip 1 each by Other route 2 (  two) times daily. Use as instructed 100 each 11  . hydroxypropyl methylcellulose / hypromellose (ISOPTO TEARS / GONIOVISC) 2.5 % ophthalmic solution Place 1 drop into both eyes 2 (two) times daily. Reported on 05/31/2015    . Lancets (ONETOUCH ULTRASOFT) lancets Use as instructed 100 each 12  . levothyroxine (SYNTHROID) 125 MCG tablet Take 1 tablet (125 mcg total) by mouth daily before breakfast. 90 tablet 3  . losartan-hydrochlorothiazide (HYZAAR) 50-12.5 MG tablet Take 0.5 tablets by mouth daily. 45 tablet 3  . polyethylene glycol (MIRALAX / GLYCOLAX) packet Take 17 g by mouth daily as needed (constipation).     . pregabalin (LYRICA) 75 MG capsule Take 75 mg by  mouth at bedtime.    . simvastatin (ZOCOR) 40 MG tablet Take 1 tablet (40 mg total) by mouth daily with supper. 90 tablet 0  . sodium chloride (OCEAN) 0.65 % SOLN nasal spray Place 1 spray into both nostrils 2 (two) times daily as needed for congestion. Reported on 05/31/2015    . [DISCONTINUED] valsartan-hydrochlorothiazide (DIOVAN-HCT) 80-12.5 MG per tablet TAKE 1 TABLET BY MOUTH DAILY 90 tablet 3   No current facility-administered medications for this visit.    Physical Exam: Filed Vitals:   09/12/15 1442  BP: 118/56  Pulse: 62  Height: 5\' 5"  (1.651 m)  Weight: 150 lb 9.6 oz (68.312 kg)    GEN- The patient is elderly appearing, alert and oriented x 3 today.   Head- normocephalic, atraumatic Neck- R CEA site is healing well Eyes-  Sclera clear, conjunctiva pink Ears- hearing intact Oropharynx- clear Lungs- Clear to ausculation bilaterally, normal work of breathing Chest- pacemaker pocket is well healed Heart- Regular rate and rhythm  GI- soft, NT, ND, + BS Extremities- no clubbing, cyanosis, +1 L leg edema   Pacemaker interrogation- reviewed in detail today,  See PACEART report  Assessment and Plan:  1. Sick sinus bradycardia Normal pacemaker function See Claudia Desanctis Art report  2. Venous insufficiency Support hose encouraged  3. HTN Stable No change required today  4. SOB Echo 11/16 reviewed euvolemic today Avoid salt  carelink Return to see EP NP as scheduled in November  Thompson Grayer MD, [facility] 09/12/2015 3:15 PM

## 2015-09-21 ENCOUNTER — Encounter: Payer: Self-pay | Admitting: Cardiology

## 2015-09-24 LAB — CUP PACEART REMOTE DEVICE CHECK
Battery Impedance: 324 Ohm
Brady Statistic AP VS Percent: 14 %
Brady Statistic AS VS Percent: 85 %
Date Time Interrogation Session: 20170504003348
Implantable Lead Implant Date: 20111031
Implantable Lead Location: 753860
Implantable Lead Model: 4470
Implantable Lead Serial Number: 548557
Lead Channel Impedance Value: 543 Ohm
Lead Channel Pacing Threshold Amplitude: 0.625 V
Lead Channel Pacing Threshold Amplitude: 0.75 V
Lead Channel Pacing Threshold Pulse Width: 0.4 ms
Lead Channel Sensing Intrinsic Amplitude: 11.2 mV
Lead Channel Setting Pacing Amplitude: 2 V
Lead Channel Setting Pacing Amplitude: 2.5 V
Lead Channel Setting Pacing Pulse Width: 0.4 ms
Lead Channel Setting Sensing Sensitivity: 5.6 mV
MDC IDC LEAD IMPLANT DT: 20111031
MDC IDC LEAD LOCATION: 753859
MDC IDC LEAD SERIAL: 686076
MDC IDC MSMT BATTERY REMAINING LONGEVITY: 116 mo
MDC IDC MSMT BATTERY VOLTAGE: 2.78 V
MDC IDC MSMT LEADCHNL RA IMPEDANCE VALUE: 487 Ohm
MDC IDC MSMT LEADCHNL RA PACING THRESHOLD PULSEWIDTH: 0.4 ms
MDC IDC MSMT LEADCHNL RA SENSING INTR AMPL: 1.4 mV
MDC IDC STAT BRADY AP VP PERCENT: 0 %
MDC IDC STAT BRADY AS VP PERCENT: 0 %

## 2015-09-26 ENCOUNTER — Encounter: Payer: Self-pay | Admitting: Cardiology

## 2015-09-29 ENCOUNTER — Other Ambulatory Visit: Payer: Self-pay | Admitting: Internal Medicine

## 2015-10-10 ENCOUNTER — Other Ambulatory Visit: Payer: Self-pay | Admitting: Internal Medicine

## 2015-10-29 ENCOUNTER — Encounter: Payer: Self-pay | Admitting: Internal Medicine

## 2015-10-29 ENCOUNTER — Ambulatory Visit (INDEPENDENT_AMBULATORY_CARE_PROVIDER_SITE_OTHER): Payer: MEDICARE | Admitting: Internal Medicine

## 2015-10-29 ENCOUNTER — Other Ambulatory Visit (INDEPENDENT_AMBULATORY_CARE_PROVIDER_SITE_OTHER): Payer: MEDICARE

## 2015-10-29 VITALS — BP 110/62 | HR 69 | Wt 150.0 lb

## 2015-10-29 DIAGNOSIS — I63319 Cerebral infarction due to thrombosis of unspecified middle cerebral artery: Secondary | ICD-10-CM | POA: Diagnosis not present

## 2015-10-29 DIAGNOSIS — E785 Hyperlipidemia, unspecified: Secondary | ICD-10-CM

## 2015-10-29 DIAGNOSIS — R300 Dysuria: Secondary | ICD-10-CM

## 2015-10-29 DIAGNOSIS — I10 Essential (primary) hypertension: Secondary | ICD-10-CM

## 2015-10-29 DIAGNOSIS — E038 Other specified hypothyroidism: Secondary | ICD-10-CM

## 2015-10-29 DIAGNOSIS — E034 Atrophy of thyroid (acquired): Secondary | ICD-10-CM

## 2015-10-29 LAB — BASIC METABOLIC PANEL
BUN: 29 mg/dL — AB (ref 6–23)
CALCIUM: 9.5 mg/dL (ref 8.4–10.5)
CO2: 28 mEq/L (ref 19–32)
CREATININE: 0.91 mg/dL (ref 0.40–1.20)
Chloride: 101 mEq/L (ref 96–112)
GFR: 61.61 mL/min (ref >60.00)
Glucose, Bld: 143 mg/dL — ABNORMAL HIGH (ref 70–99)
POTASSIUM: 4.4 meq/L (ref 3.5–5.1)
Sodium: 136 mEq/L (ref 135–145)

## 2015-10-29 LAB — HEMOGLOBIN A1C: HEMOGLOBIN A1C: 6.5 % (ref 4.6–6.5)

## 2015-10-29 LAB — TSH: TSH: 0.05 u[IU]/mL — ABNORMAL LOW (ref 0.35–4.50)

## 2015-10-29 NOTE — Assessment & Plan Note (Signed)
Losartan HCT 

## 2015-10-29 NOTE — Progress Notes (Signed)
Pre visit review using our clinic review tool, if applicable. No additional management support is needed unless otherwise documented below in the visit note. 

## 2015-10-29 NOTE — Assessment & Plan Note (Signed)
Labs

## 2015-10-29 NOTE — Assessment & Plan Note (Signed)
On Simvastatin 

## 2015-10-29 NOTE — Addendum Note (Signed)
Addended by: Cresenciano Lick on: 10/29/2015 03:52 PM   Modules accepted: Orders

## 2015-10-29 NOTE — Progress Notes (Signed)
Subjective:  Patient ID: Kayla Davis, female    DOB: May 21, 1922  Age: 82 y.o. MRN: [id number]  CC: No chief complaint on file.   HPI Kayla Davis presents for HTN, gait disorder, dyslipidemia f/u. F/u DM (not on Rx)  Outpatient Prescriptions Prior to Visit  Medication Sig Dispense Refill  . albuterol (PROVENTIL HFA) 108 (90 Base) MCG/ACT inhaler Inhale 2 puffs into the lungs every 6 (six) hours as needed for wheezing or shortness of breath. 1 Inhaler 1  . aspirin 81 MG tablet Take 81 mg by mouth daily.    . fluticasone (FLONASE ALLERGY RELIEF) 50 MCG/ACT nasal spray Place into both nostrils daily.    . hydroxypropyl methylcellulose / hypromellose (ISOPTO TEARS / GONIOVISC) 2.5 % ophthalmic solution Place 1 drop into both eyes 2 (two) times daily. Reported on 05/31/2015    . Lancets (ONETOUCH ULTRASOFT) lancets Use to check blood sugars once a day Dx E11.9 100 each 3  . levothyroxine (SYNTHROID) 125 MCG tablet Take 1 tablet (125 mcg total) by mouth daily before breakfast. 90 tablet 3  . losartan-hydrochlorothiazide (HYZAAR) 50-12.5 MG tablet Take 0.5 tablets by mouth daily. 45 tablet 3  . ONE TOUCH ULTRA TEST test strip TEST ONCE DAILY AS DIRECTED 50 each 5  . polyethylene glycol (MIRALAX / GLYCOLAX) packet Take 17 g by mouth daily as needed (constipation).     . pregabalin (LYRICA) 75 MG capsule Take 75 mg by mouth at bedtime.    . simvastatin (ZOCOR) 40 MG tablet Take 1 tablet (40 mg total) by mouth daily with supper. 90 tablet 0  . sodium chloride (OCEAN) 0.65 % SOLN nasal spray Place 1 spray into both nostrils 2 (two) times daily as needed for congestion. Reported on 05/31/2015    . cetirizine (ZYRTEC) 10 MG tablet Take 10 mg by mouth daily as needed for allergies. Reported on 10/29/2015     No facility-administered medications prior to visit.    ROS Review of Systems  Constitutional: Negative for chills, activity change, appetite change, fatigue and unexpected weight  change.  HENT: Negative for congestion, mouth sores and sinus pressure.   Eyes: Negative for visual disturbance.  Respiratory: Negative for cough and chest tightness.   Gastrointestinal: Negative for nausea and abdominal pain.  Genitourinary: Negative for frequency, difficulty urinating and vaginal pain.  Musculoskeletal: Positive for gait problem. Negative for back pain.  Skin: Negative for pallor and rash.  Neurological: Negative for dizziness, tremors, weakness, numbness and headaches.  Psychiatric/Behavioral: Negative for confusion and sleep disturbance.    Objective:  BP 110/62 mmHg  Pulse 69  Wt 150 lb (68.04 kg)  SpO2 96%  BP Readings from Last 3 Encounters:  10/29/15 110/62  09/12/15 118/56  08/20/15 110/58    Wt Readings from Last 3 Encounters:  10/29/15 150 lb (68.04 kg)  09/12/15 150 lb 9.6 oz (68.312 kg)  08/16/15 149 lb (67.586 kg)    Physical Exam  Constitutional: She appears well-developed. No distress.  HENT:  Head: Normocephalic.  Right Ear: External ear normal.  Left Ear: External ear normal.  Nose: Nose normal.  Mouth/Throat: Oropharynx is clear and moist.  Eyes: Conjunctivae are normal. Pupils are equal, round, and reactive to light. Right eye exhibits no discharge. Left eye exhibits no discharge.  Neck: Normal range of motion. Neck supple. No JVD present. No tracheal deviation present. No thyromegaly present.  Cardiovascular: Normal rate and regular rhythm.   Murmur heard. Pulmonary/Chest: No stridor. No respiratory distress. She  has no wheezes.  Abdominal: Soft. Bowel sounds are normal. She exhibits no distension and no mass. There is no tenderness. There is no rebound and no guarding.  Musculoskeletal: She exhibits no edema or tenderness.  Lymphadenopathy:    She has no cervical adenopathy.  Neurological: She displays normal reflexes. No cranial nerve deficit. She exhibits normal muscle tone. Coordination abnormal.  Skin: No rash noted. No  erythema.  Psychiatric: She has a normal mood and affect. Her behavior is normal. Judgment and thought content normal.  walker 2/6 murmur   Lab Results  Component Value Date   WBC 6.4 08/20/2015   HGB 12.0 08/20/2015   HCT 35.9* 08/20/2015   PLT 205.0 08/20/2015   GLUCOSE 189* 08/20/2015   CHOL 161 01/17/2014   TRIG 56 01/17/2014   HDL 68 01/17/2014   LDLDIRECT 143.0 10/07/2007   LDLCALC 82 01/17/2014   ALT 15 03/16/2015   AST 18 03/16/2015   NA 136 08/20/2015   K 4.9 08/20/2015   CL 102 08/20/2015   CREATININE 0.90 08/20/2015   BUN 22 08/20/2015   CO2 28 08/20/2015   TSH 0.11* 06/27/2015   INR 1.06 03/16/2015   HGBA1C 6.7* 06/27/2015    Dg Chest 2 View  08/16/2015  CLINICAL DATA:  Cough. EXAM: CHEST  2 VIEW COMPARISON:  Aug 30, 2014. FINDINGS: The heart size and mediastinal contours are within normal limits. Right-sided pacemaker is unchanged in position. No pneumothorax or pleural effusion is noted. Mild bibasilar interstitial densities are noted which may represent scarring or possibly edema. Anterior osteophyte formation is noted throughout the thoracic spine. IMPRESSION: Mild bibasilar interstitial densities which may represent scarring or possibly edema. Electronically Signed   By: Marijo Conception, M.D.   On: 08/16/2015 16:09    Assessment & Plan:   There are no diagnoses linked to this encounter. I have discontinued Ms. Sandquist's cetirizine. I am also having her maintain her sodium chloride, pregabalin, hydroxypropyl methylcellulose / hypromellose, polyethylene glycol, levothyroxine, simvastatin, fluticasone, aspirin, albuterol, losartan-hydrochlorothiazide, ONE TOUCH ULTRA TEST, onetouch ultrasoft, ipratropium, and loratadine.  Meds ordered this encounter  Medications  . ipratropium (ATROVENT) 0.06 % nasal spray    Sig:   . loratadine (CLARITIN) 10 MG tablet    Sig: Take 10 mg by mouth daily as needed for allergies.     Follow-up: No Follow-up on file.  [name], MD

## 2015-10-29 NOTE — Assessment & Plan Note (Signed)
On diet ASA, Losartan HCT, Simvastatin Labs

## 2015-10-30 LAB — URINALYSIS, ROUTINE W REFLEX MICROSCOPIC
Bilirubin Urine: NEGATIVE
Ketones, ur: NEGATIVE
Nitrite: POSITIVE — AB
PH: 5.5 (ref 5.0–8.0)
SPECIFIC GRAVITY, URINE: 1.015 (ref 1.000–1.030)
Total Protein, Urine: NEGATIVE
UROBILINOGEN UA: 0.2 (ref 0.0–1.0)
Urine Glucose: NEGATIVE

## 2015-11-05 ENCOUNTER — Other Ambulatory Visit: Payer: Self-pay | Admitting: *Deleted

## 2015-11-05 MED ORDER — SIMVASTATIN 40 MG PO TABS: 40.0000 mg | ORAL_TABLET | Freq: Every day | ORAL | 1 refills | Status: DC

## 2015-11-05 NOTE — Telephone Encounter (Signed)
Pt left msg on triage stating [facility] has sent request to have her simvastatin refill but they have not received back. Per chart no request has came through sent refill on simvastatin to mail service. Tried to call pt no answer & can't leave msg due to VM not set-up...Johny Chess

## 2015-11-06 ENCOUNTER — Other Ambulatory Visit: Payer: Self-pay | Admitting: *Deleted

## 2015-11-06 MED ORDER — SIMVASTATIN 40 MG PO TABS: 40.0000 mg | ORAL_TABLET | Freq: Every day | ORAL | 1 refills | Status: DC

## 2015-11-23 DIAGNOSIS — H04123 Dry eye syndrome of bilateral lacrimal glands: Secondary | ICD-10-CM | POA: Diagnosis not present

## 2015-11-23 DIAGNOSIS — H524 Presbyopia: Secondary | ICD-10-CM | POA: Diagnosis not present

## 2015-11-23 DIAGNOSIS — Z961 Presence of intraocular lens: Secondary | ICD-10-CM | POA: Diagnosis not present

## 2015-11-23 DIAGNOSIS — E119 Type 2 diabetes mellitus without complications: Secondary | ICD-10-CM | POA: Diagnosis not present

## 2015-11-23 LAB — HM DIABETES EYE EXAM

## 2015-11-27 ENCOUNTER — Telehealth: Payer: Self-pay | Admitting: *Deleted

## 2015-11-27 ENCOUNTER — Encounter: Payer: Self-pay | Admitting: Family Medicine

## 2015-11-27 ENCOUNTER — Telehealth: Payer: Self-pay | Admitting: Internal Medicine

## 2015-11-27 ENCOUNTER — Ambulatory Visit (INDEPENDENT_AMBULATORY_CARE_PROVIDER_SITE_OTHER): Payer: MEDICARE | Admitting: Family Medicine

## 2015-11-27 VITALS — BP 100/54 | HR 68 | Temp 98.3°F | Ht 65.0 in | Wt 150.0 lb

## 2015-11-27 DIAGNOSIS — J309 Allergic rhinitis, unspecified: Secondary | ICD-10-CM | POA: Diagnosis not present

## 2015-11-27 DIAGNOSIS — I63319 Cerebral infarction due to thrombosis of unspecified middle cerebral artery: Secondary | ICD-10-CM

## 2015-11-27 DIAGNOSIS — R42 Dizziness and giddiness: Secondary | ICD-10-CM

## 2015-11-27 DIAGNOSIS — H6981 Other specified disorders of Eustachian tube, right ear: Secondary | ICD-10-CM

## 2015-11-27 MED ORDER — MECLIZINE HCL 12.5 MG PO TABS: 12.5000 mg | ORAL_TABLET | Freq: Three times a day (TID) | ORAL | 0 refills | Status: DC | PRN

## 2015-11-27 NOTE — Progress Notes (Signed)
Pre visit review using our clinic review tool, if applicable. No additional management support is needed unless otherwise documented below in the visit note. 

## 2015-11-27 NOTE — Telephone Encounter (Signed)
Page: 1 of 2 Call Id: [id number] [place] [facility] - Client [place] Patient Name: Kayla Davis DOB: July 27, 1922 Initial Comment Caller states 82 yr old mother is having ear pain and dizziness; Nurse Assessment Nurse: Markus Daft, RN, Sherre Poot Date/Time (Eastern Time): 11/27/2015 11:10:14 AM Confirm and document reason for call. If symptomatic, describe symptoms. You must click the next button to save text entered. ---Caller states that her mother is having ear pain and pressure along with dizziness/nausea/fever (not measured as no thermometer). Since yesterday night. Has the patient traveled out of the country within the last 30 days? ---No Does the patient have any new or worsening symptoms? ---Yes Will a triage be completed? ---Yes Related visit to physician within the last 2 weeks? ---No Does the PT have any chronic conditions? (i.e. diabetes, asthma, etc.) ---Yes List chronic conditions. ---NIDDM, Pacemaker Is this a behavioral health or substance abuse call? ---No Guidelines Guideline Title Affirmed Question Affirmed Notes Earache Diabetes mellitus or weak immune system (e.g., HIV positive, cancer chemo, splenectomy, organ transplant, chronic steroids) Dizziness - Lightheadedness [1] Fever > 100.5 F (38.1 C) AND [2] diabetes mellitus or weak immune system (e.g., HIV positive, cancer chemo, splenectomy, organ transplant, chronic steroids) Final Disposition User See Physician within 4 Hours (or PCP triage) Markus Daft, RN, Windy Comments No fever currently. Appt made for f/u at [facility] office with Dr. Colin Benton at 2 pm today as there were no openings at the [facility]. Referrals REFERRED TO PCP OFFICE Disagree/Comply: Comply

## 2015-11-27 NOTE — Patient Instructions (Addendum)
Flonase 2 sprays each nostril daily for 3 weeks, then 1 spray each nostril daily.  Zyrtec OR claritin once daily.  Meclizine only if needed for vertigo.  Follow up with your doctor in 2-3 weeks, sooner if worsening or other concerns.

## 2015-11-27 NOTE — Progress Notes (Signed)
HPI:  Acute visit for multiple complaints:  Nasal congestion and R ear fullness: -started: has allergies, a little worse this week -symptoms:nasal congestion, PND, occ cough, occ R ear pain or fullness, occ sinus pressure -denies:fever, SOB, NVD, tooth pain -has tried: zyrtec today - not using nasal sprays -sick contacts/travel/risks: no reported flu, strep or tick exposure -Hx of: allergies   Vertigo: -brief spell of vertigo with mild nausea when turned head suddenly this morning, resolved quickly and no spells since, has a few other positional related brief spells earlier this week -reports a history of the same in the past on several occassions and reports was "inner ear" - reports thinks took a medication -wears hearing aides -denies: HA, vision changes, speech changes, CP, sob, palpitations, fevers, malaise  ROS: See pertinent positives and negatives per HPI.  Past Medical History:  Diagnosis Date  . Anemia, unspecified   . Anxiety state, unspecified   . Aortic valve disorders   . Backache, unspecified   . Bradycardia    s/p PPM  . Bursitis   . Complication of anesthesia    has had difficulty awakening in past; pt states did well with the anesthesia given during her carotid surgery   . Diverticulosis of colon (without mention of hemorrhage)   . Heart murmur   . Hemorrhoids   . Herpes zoster with other nervous system complications(053.19)   . History of frequent urinary tract infections   . History of measles as a child   . History of mumps as a child   . History of shingles   . Hyperlipidemia   . Hypertension   . Irritable bowel syndrome   . Menopause   . Osteoarthrosis, unspecified whether generalized or localized, unspecified site   . Osteoporosis, unspecified   . Other abnormal glucose   . Pain in limb   . Peripheral vascular disease ([place])   . Presence of permanent cardiac pacemaker   . TIA (transient ischemic attack)   . Unspecified chronic bronchitis  ([place])   . Unspecified gastritis and gastroduodenitis without mention of hemorrhage   . Unspecified hearing loss   . Unspecified hereditary and idiopathic peripheral neuropathy   . Unspecified hypothyroidism   . Urinary incontinence   . Varicose veins     Past Surgical History:  Procedure Laterality Date  . APPENDECTOMY    . CATARACT EXTRACTION, BILATERAL  end of June 2013  . CATARACT EXTRACTION, BILATERAL  beginning of July 2013  . CHOLECYSTECTOMY    . DILATION AND CURETTAGE OF UTERUS    . ENDARTERECTOMY Right 01/20/2014   Procedure: RIGHT CAROTID ARTERY ENDARTERECTOMY WITH DACRON PATCH ANGIOPLASTY;  Surgeon: Mal Misty, MD;  Location: [facility];  Service: Vascular;  Laterality: Right;  . PACEMAKER INSERTION     MDT  . TONSILLECTOMY    . TOTAL HIP ARTHROPLASTY Left 03/23/2015   Procedure: TOTAL HIP ARTHROPLASTY ANTERIOR APPROACH;  Surgeon: Gaynelle Arabian, MD;  Location: [facility];  Service: Orthopedics;  Laterality: Left;  [name] VAGINAL HYSTERECTOMY      Family History  Problem Relation Age of Onset  . Heart disease Father   . Cancer Paternal Grandmother   . Diabetes Father   . Heart disease Mother     Social History   Social History  . Marital status: Widowed    Spouse name: Gypsy Balsam.  (deceased)  . Number of children: 4  . Years of education: COLLEGE   Occupational History  . Retired   .  Retired   Social History Main Topics  . Smoking status: Never Smoker  . Smokeless tobacco: Never Used  . Alcohol use No  . Drug use: No  . Sexual activity: Not Asked   Other Topics Concern  . None   Social History Narrative   Patient is widowed with 4 children.   Patient is right handed.   Patient has college education.   Patient drinks 2 cups daily.     Current Outpatient Prescriptions:  .  aspirin 81 MG tablet, Take 81 mg by mouth daily., Disp: , Rfl:  .  fluticasone (FLONASE ALLERGY RELIEF) 50 MCG/ACT nasal spray, Place into both nostrils daily., Disp: , Rfl:  .   hydroxypropyl methylcellulose / hypromellose (ISOPTO TEARS / GONIOVISC) 2.5 % ophthalmic solution, Place 1 drop into both eyes 2 (two) times daily. Reported on 05/31/2015, Disp: , Rfl:  .  Lancets (ONETOUCH ULTRASOFT) lancets, Use to check blood sugars once a day Dx E11.9, Disp: 100 each, Rfl: 3 .  levothyroxine (SYNTHROID) 125 MCG tablet, Take 1 tablet (125 mcg total) by mouth daily before breakfast., Disp: 90 tablet, Rfl: 3 .  loratadine (CLARITIN) 10 MG tablet, Take 10 mg by mouth daily as needed for allergies., Disp: , Rfl:  .  losartan-hydrochlorothiazide (HYZAAR) 50-12.5 MG tablet, Take 0.5 tablets by mouth daily., Disp: 45 tablet, Rfl: 3 .  ONE TOUCH ULTRA TEST test strip, TEST ONCE DAILY AS DIRECTED, Disp: 50 each, Rfl: 5 .  polyethylene glycol (MIRALAX / GLYCOLAX) packet, Take 17 g by mouth daily as needed (constipation). , Disp: , Rfl:  .  pregabalin (LYRICA) 75 MG capsule, Take 75 mg by mouth at bedtime., Disp: , Rfl:  .  simvastatin (ZOCOR) 40 MG tablet, Take 1 tablet (40 mg total) by mouth daily with supper., Disp: 90 tablet, Rfl: 1 .  sodium chloride (OCEAN) 0.65 % SOLN nasal spray, Place 1 spray into both nostrils 2 (two) times daily as needed for congestion. Reported on 05/31/2015, Disp: , Rfl:  .  meclizine (ANTIVERT) 12.5 MG tablet, Take 1 tablet (12.5 mg total) by mouth 3 (three) times daily as needed for dizziness., Disp: 30 tablet, Rfl: 0  EXAM:  Vitals:   11/27/15 1338  BP: (!) 100/54  Pulse: 68  Temp: 98.3 F (36.8 C)    Body mass index is 24.96 kg/m.  GENERAL: vitals reviewed and listed above, alert, oriented, appears well hydrated and in no acute distress  HEENT: atraumatic, conjunttiva clear, PERRLA, visual acuity grossly intact, no obvious abnormalities on inspection of external nose and ears, normal appearance of ear canals and TMs except for clear effusion R, clear nasal congestion, mild post oropharyngeal erythema with PND, no tonsillar edema or exudate, no  sinus TTP  NECK: no obvious masses on inspection  LUNGS: clear to auscultation bilaterally, no wheezes, rales or rhonchi, good air movement  CV: HRRR, no peripheral edema  MS: moves all extremities without noticeable abnormality  PSYCH/NEURO: pleasant and cooperative, no obvious depression or anxiety, CN II-XII grossly intact, finger to nose normal, speech and thought processing grossly intact, dix hallpike + to R  ASSESSMENT AND PLAN:  Discussed the following assessment and plan:  Allergic rhinitis, unspecified allergic rhinitis type ETD (eustachian tube dysfunction), right -given HPI and exam findings today, a serious infection or illness is unlikely. We discussed potential etiologies, with Allergic rhinitis vs VURI and 2ndary effusion being most likely. We discussed treatment side effects, likely course, antibiotic misuse, transmission, and signs of developing a  serious illness. Opted to treat with re-initiating allergy regimen of antihistamine and INS. F/u to ensure resolution in 2-3 weeks, sooner as needed.  Vertigo -we discussed possible serious and likely etiologies, workup and treatment, treatment risks and return precautions - suspect BPPV given hx and exam, potentially complicated by her allergy and middle ear issues. BP on low end, but this is not unusual for her and she reports cardiologist monitors. -after this discussion, [place] opted for tx allergies, meclizine low dose prn, vestibular rehab if persists -follow up advised in 2-3 weeks, sooner if needed  -of course, we advised to return or notify a doctor immediately if symptoms worsen or persist or new concerns arise.    Patient Instructions  Flonase 2 sprays each nostril daily for 3 weeks, then 1 spray each nostril daily.  Zyrtec OR claritin once daily.  Meclizine only if needed for vertigo.  Follow up with your doctor in 2-3 weeks, sooner if worsening or other concerns.   Colin Benton R., DO

## 2015-11-29 ENCOUNTER — Encounter: Payer: Self-pay | Admitting: Internal Medicine

## 2015-12-06 NOTE — Telephone Encounter (Signed)
Error

## 2015-12-11 ENCOUNTER — Encounter: Payer: Self-pay | Admitting: Internal Medicine

## 2015-12-11 ENCOUNTER — Ambulatory Visit (INDEPENDENT_AMBULATORY_CARE_PROVIDER_SITE_OTHER): Payer: MEDICARE | Admitting: Internal Medicine

## 2015-12-11 VITALS — BP 130/60 | HR 76 | Temp 97.7°F | Resp 16 | Wt 149.0 lb

## 2015-12-11 DIAGNOSIS — R42 Dizziness and giddiness: Secondary | ICD-10-CM | POA: Diagnosis not present

## 2015-12-11 DIAGNOSIS — Z23 Encounter for immunization: Secondary | ICD-10-CM | POA: Diagnosis not present

## 2015-12-11 DIAGNOSIS — J310 Chronic rhinitis: Secondary | ICD-10-CM | POA: Diagnosis not present

## 2015-12-11 DIAGNOSIS — R739 Hyperglycemia, unspecified: Secondary | ICD-10-CM

## 2015-12-11 DIAGNOSIS — Z09 Encounter for follow-up examination after completed treatment for conditions other than malignant neoplasm: Secondary | ICD-10-CM | POA: Diagnosis not present

## 2015-12-11 NOTE — Assessment & Plan Note (Signed)
Resolved

## 2015-12-11 NOTE — Assessment & Plan Note (Signed)
Atrovent PRN

## 2015-12-11 NOTE — Progress Notes (Signed)
Subjective:  Patient ID: Kayla Davis, female    DOB: [birth year]  Age: 82 y.o. MRN: [id number]  CC: No chief complaint on file.   HPI Kayla Davis presents for a vertigo 2 wks ago. It has resolved. F/u on her drippy nose - Flonase did not help...  Outpatient Medications Prior to Visit  Medication Sig Dispense Refill  . aspirin 81 MG tablet Take 81 mg by mouth daily.    . fluticasone (FLONASE ALLERGY RELIEF) 50 MCG/ACT nasal spray Place into both nostrils daily.    . hydroxypropyl methylcellulose / hypromellose (ISOPTO TEARS / GONIOVISC) 2.5 % ophthalmic solution Place 1 drop into both eyes 2 (two) times daily. Reported on 05/31/2015    . Lancets (ONETOUCH ULTRASOFT) lancets Use to check blood sugars once a day Dx E11.9 100 each 3  . levothyroxine (SYNTHROID) 125 MCG tablet Take 1 tablet (125 mcg total) by mouth daily before breakfast. 90 tablet 3  . loratadine (CLARITIN) 10 MG tablet Take 10 mg by mouth daily as needed for allergies.    [name] losartan-hydrochlorothiazide (HYZAAR) 50-12.5 MG tablet Take 0.5 tablets by mouth daily. 45 tablet 3  . meclizine (ANTIVERT) 12.5 MG tablet Take 1 tablet (12.5 mg total) by mouth 3 (three) times daily as needed for dizziness. 30 tablet 0  . ONE TOUCH ULTRA TEST test strip TEST ONCE DAILY AS DIRECTED 50 each 5  . polyethylene glycol (MIRALAX / GLYCOLAX) packet Take 17 g by mouth daily as needed (constipation).     . pregabalin (LYRICA) 75 MG capsule Take 75 mg by mouth at bedtime.    . simvastatin (ZOCOR) 40 MG tablet Take 1 tablet (40 mg total) by mouth daily with supper. 90 tablet 1  . sodium chloride (OCEAN) 0.65 % SOLN nasal spray Place 1 spray into both nostrils 2 (two) times daily as needed for congestion. Reported on 05/31/2015     No facility-administered medications prior to visit.     ROS Review of Systems  Constitutional: Negative.  Negative for activity change, appetite change, chills, diaphoresis, fatigue, fever and unexpected  weight change.  HENT: Negative for congestion, ear pain, facial swelling, hearing loss, mouth sores, nosebleeds, postnasal drip, rhinorrhea, sinus pressure, sneezing, sore throat, tinnitus and trouble swallowing.   Eyes: Negative for pain, discharge, redness, itching and visual disturbance.  Respiratory: Negative for cough, chest tightness, shortness of breath, wheezing and stridor.   Cardiovascular: Negative for chest pain, palpitations and leg swelling.  Gastrointestinal: Negative for abdominal distention, anal bleeding, blood in stool, constipation, diarrhea, nausea and rectal pain.  Genitourinary: Negative for difficulty urinating, dysuria, flank pain, frequency, genital sores, hematuria, pelvic pain, urgency, vaginal bleeding and vaginal discharge.  Musculoskeletal: Negative for arthralgias, back pain, gait problem, joint swelling, neck pain and neck stiffness.  Skin: Negative.  Negative for rash.  Neurological: Negative for dizziness, tremors, seizures, syncope, speech difficulty, weakness, numbness and headaches.  Hematological: Negative for adenopathy. Does not bruise/bleed easily.  Psychiatric/Behavioral: Negative for behavioral problems, decreased concentration, dysphoric mood, sleep disturbance and suicidal ideas. The patient is not nervous/anxious.     Objective:  BP 130/60   Pulse 76   Temp 97.7 F (36.5 C) (Oral)   Resp 16   Wt 149 lb (67.6 kg)   BMI 24.79 kg/m   BP Readings from Last 3 Encounters:  12/11/15 130/60  11/27/15 (!) 100/54  10/29/15 110/62    Wt Readings from Last 3 Encounters:  12/11/15 149 lb (67.6 kg)  11/27/15 150  lb (68 kg)  10/29/15 150 lb (68 kg)    Physical Exam  Constitutional: She appears well-developed. No distress.  HENT:  Head: Normocephalic.  Right Ear: External ear normal.  Left Ear: External ear normal.  Nose: Nose normal.  Mouth/Throat: Oropharynx is clear and moist.  Eyes: Conjunctivae are normal. Pupils are equal, round, and  reactive to light. Right eye exhibits no discharge. Left eye exhibits no discharge.  Neck: Normal range of motion. Neck supple. No JVD present. No tracheal deviation present. No thyromegaly present.  Cardiovascular: Normal rate, regular rhythm and normal heart sounds.   Pulmonary/Chest: No stridor. No respiratory distress. She has no wheezes.  Abdominal: Soft. Bowel sounds are normal. She exhibits no distension and no mass. There is no tenderness. There is no rebound and no guarding.  Musculoskeletal: She exhibits no edema or tenderness.  Lymphadenopathy:    She has no cervical adenopathy.  Neurological: She displays normal reflexes. No cranial nerve deficit. She exhibits normal muscle tone. Coordination abnormal.  Skin: No rash noted. No erythema.  Psychiatric: She has a normal mood and affect. Her behavior is normal. Judgment and thought content normal.  walker B hearing aids  Lab Results  Component Value Date   WBC 6.4 08/20/2015   HGB 12.0 08/20/2015   HCT 35.9 (L) 08/20/2015   PLT 205.0 08/20/2015   GLUCOSE 143 (H) 10/29/2015   CHOL 161 01/17/2014   TRIG 56 01/17/2014   HDL 68 01/17/2014   LDLDIRECT 143.0 10/07/2007   LDLCALC 82 01/17/2014   ALT 15 03/16/2015   AST 18 03/16/2015   NA 136 10/29/2015   K 4.4 10/29/2015   CL 101 10/29/2015   CREATININE 0.91 10/29/2015   BUN 29 (H) 10/29/2015   CO2 28 10/29/2015   TSH 0.05 (L) 10/29/2015   INR 1.06 03/16/2015   HGBA1C 6.5 10/29/2015    Dg Chest 2 View  Result Date: 08/16/2015 CLINICAL DATA:  Cough. EXAM: CHEST  2 VIEW COMPARISON:  Aug 30, 2014. FINDINGS: The heart size and mediastinal contours are within normal limits. Right-sided pacemaker is unchanged in position. No pneumothorax or pleural effusion is noted. Mild bibasilar interstitial densities are noted which may represent scarring or possibly edema. Anterior osteophyte formation is noted throughout the thoracic spine. IMPRESSION: Mild bibasilar interstitial densities  which may represent scarring or possibly edema. Electronically Signed   By: Marijo Conception, M.D.   On: 08/16/2015 16:09    Assessment & Plan:   There are no diagnoses linked to this encounter. I am having Kayla Davis maintain her sodium chloride, pregabalin, hydroxypropyl methylcellulose / hypromellose, polyethylene glycol, levothyroxine, fluticasone, aspirin, losartan-hydrochlorothiazide, ONE TOUCH ULTRA TEST, onetouch ultrasoft, loratadine, simvastatin, and meclizine.  No orders of the defined types were placed in this encounter.    Follow-up: No Follow-up on file.  [name], MD

## 2015-12-11 NOTE — Progress Notes (Signed)
Pre visit review using our clinic review tool, if applicable. No additional management support is needed unless otherwise documented below in the visit note. 

## 2015-12-12 ENCOUNTER — Telehealth: Payer: Self-pay | Admitting: Cardiology

## 2015-12-12 ENCOUNTER — Ambulatory Visit (INDEPENDENT_AMBULATORY_CARE_PROVIDER_SITE_OTHER): Payer: MEDICARE | Admitting: *Deleted

## 2015-12-12 DIAGNOSIS — I495 Sick sinus syndrome: Secondary | ICD-10-CM | POA: Diagnosis not present

## 2015-12-12 NOTE — Telephone Encounter (Signed)
Spoke with pt and reminded pt of remote transmission that is due today. Pt verbalized understanding.   

## 2015-12-13 NOTE — Progress Notes (Signed)
Remote pacemaker transmission.   

## 2015-12-14 ENCOUNTER — Encounter: Payer: Self-pay | Admitting: Cardiology

## 2015-12-14 NOTE — Progress Notes (Signed)
Letter  

## 2015-12-18 ENCOUNTER — Other Ambulatory Visit: Payer: Self-pay | Admitting: *Deleted

## 2015-12-18 MED ORDER — IPRATROPIUM BROMIDE 0.06 % NA SOLN: 2.0000 | Freq: Three times a day (TID) | NASAL | 0 refills | Status: DC

## 2015-12-19 LAB — CUP PACEART REMOTE DEVICE CHECK
Battery Impedance: 373 Ohm
Battery Remaining Longevity: 108 mo
Brady Statistic AP VP Percent: 0 %
Brady Statistic AP VS Percent: 33 %
Brady Statistic AS VP Percent: 0 %
Brady Statistic AS VS Percent: 67 %
Implantable Lead Implant Date: 20111031
Implantable Lead Location: 753859
Implantable Lead Model: 4470
Implantable Lead Serial Number: 548557
Implantable Lead Serial Number: 686076
Lead Channel Impedance Value: 480 Ohm
Lead Channel Impedance Value: 498 Ohm
Lead Channel Pacing Threshold Amplitude: 0.625 V
Lead Channel Pacing Threshold Pulse Width: 0.4 ms
Lead Channel Setting Pacing Amplitude: 2 V
MDC IDC LEAD IMPLANT DT: 20111031
MDC IDC LEAD LOCATION: 753860
MDC IDC MSMT BATTERY VOLTAGE: 2.78 V
MDC IDC MSMT LEADCHNL RA PACING THRESHOLD AMPLITUDE: 0.5 V
MDC IDC MSMT LEADCHNL RV PACING THRESHOLD PULSEWIDTH: 0.4 ms
MDC IDC SESS DTM: 20170831001300
MDC IDC SET LEADCHNL RV PACING AMPLITUDE: 2.5 V
MDC IDC SET LEADCHNL RV PACING PULSEWIDTH: 0.4 ms
MDC IDC SET LEADCHNL RV SENSING SENSITIVITY: 4 mV

## 2016-01-11 DIAGNOSIS — Z471 Aftercare following joint replacement surgery: Secondary | ICD-10-CM | POA: Diagnosis not present

## 2016-01-11 DIAGNOSIS — M1611 Unilateral primary osteoarthritis, right hip: Secondary | ICD-10-CM | POA: Diagnosis not present

## 2016-01-11 DIAGNOSIS — Z96642 Presence of left artificial hip joint: Secondary | ICD-10-CM | POA: Diagnosis not present

## 2016-01-11 DIAGNOSIS — M1711 Unilateral primary osteoarthritis, right knee: Secondary | ICD-10-CM | POA: Diagnosis not present

## 2016-02-06 ENCOUNTER — Ambulatory Visit: Payer: MEDICARE | Admitting: Podiatry

## 2016-02-06 ENCOUNTER — Ambulatory Visit (INDEPENDENT_AMBULATORY_CARE_PROVIDER_SITE_OTHER): Payer: MEDICARE | Admitting: Podiatry

## 2016-02-06 ENCOUNTER — Encounter: Payer: Self-pay | Admitting: Podiatry

## 2016-02-06 DIAGNOSIS — L600 Ingrowing nail: Secondary | ICD-10-CM

## 2016-02-06 DIAGNOSIS — L03039 Cellulitis of unspecified toe: Secondary | ICD-10-CM | POA: Diagnosis not present

## 2016-02-06 DIAGNOSIS — M79676 Pain in unspecified toe(s): Secondary | ICD-10-CM

## 2016-02-06 DIAGNOSIS — I63319 Cerebral infarction due to thrombosis of unspecified middle cerebral artery: Secondary | ICD-10-CM

## 2016-02-06 NOTE — Progress Notes (Signed)
   Subjective:    Patient ID: Kayla Davis, female    DOB: 1922-09-07, 82 y.o.   MRN: [id number]  HPI    Review of Systems  HENT: Positive for ear pain and hearing loss.   All other systems reviewed and are negative.      Objective:   Physical Exam        Assessment & Plan:

## 2016-02-07 NOTE — Progress Notes (Signed)
Patient ID: Kayla Davis, female   DOB: 1923-02-06, 82 y.o.   MRN: [id number] Subjective: Patient presents today for evaluation of pain in her toe(s). Patient is concerned for possible ingrown nail. Patient states that the pain has been present for a few weeks now. Patient presents today for further treatment and evaluation.  Objective:  General: Well developed, nourished, in no acute distress, alert and oriented x3   Dermatology: Skin is warm, dry and supple bilateral. Medial border of the right great toe appears to be erythematous with evidence of an ingrowing nail. Purulent drainage noted with intruding nail into the respective nail fold. Pain on palpation noted to the border of the nail fold. The remaining nails appear unremarkable at this time. There are no open sores, lesions.  Vascular: Dorsalis Pedis artery and Posterior Tibial artery pedal pulses palpable. No lower extremity edema noted.   Neruologic: Grossly intact via light touch bilateral.  Musculoskeletal: Muscular strength within normal limits in all groups bilateral. Normal range of motion noted to all pedal and ankle joints.   Assesement: #1 paronychia right great toe medial border #2 onychocryptosis right great toe medial border #3 ingrowing nail right great toe medial border #4 pain in right great toe   Plan of Care:  1. Patient evaluated.  2. Discussed treatment alternatives and plan of care. Explained nail avulsion procedure and post procedure course to patient. 3. Patient opted for permanent partial nail avulsion.  4. Prior to procedure, local anesthesia infiltration utilized using 3 ml of a 50:50 mixture of 2% plain lidocaine and 0.5% plain marcaine in a normal hallux block fashion and a betadine prep performed.  5. Partial permanent nail avulsion with chemical matrixectomy performed using [id number] applications of phenol followed by alcohol flush.  6. Light dressing applied. 7. Return to clinic in 2 weeks.   Dr.  Edrick Kins [facility]

## 2016-02-11 DIAGNOSIS — H6063 Unspecified chronic otitis externa, bilateral: Secondary | ICD-10-CM | POA: Diagnosis not present

## 2016-02-11 DIAGNOSIS — L570 Actinic keratosis: Secondary | ICD-10-CM | POA: Diagnosis not present

## 2016-02-13 ENCOUNTER — Encounter: Payer: MEDICARE | Admitting: Nurse Practitioner

## 2016-02-18 ENCOUNTER — Encounter (INDEPENDENT_AMBULATORY_CARE_PROVIDER_SITE_OTHER): Payer: Self-pay

## 2016-02-18 ENCOUNTER — Ambulatory Visit (INDEPENDENT_AMBULATORY_CARE_PROVIDER_SITE_OTHER): Payer: MEDICARE | Admitting: Internal Medicine

## 2016-02-18 ENCOUNTER — Encounter: Payer: Self-pay | Admitting: Internal Medicine

## 2016-02-18 VITALS — BP 114/76 | HR 78 | Ht 65.0 in | Wt 150.8 lb

## 2016-02-18 DIAGNOSIS — I10 Essential (primary) hypertension: Secondary | ICD-10-CM

## 2016-02-18 DIAGNOSIS — I495 Sick sinus syndrome: Secondary | ICD-10-CM | POA: Diagnosis not present

## 2016-02-18 DIAGNOSIS — I63319 Cerebral infarction due to thrombosis of unspecified middle cerebral artery: Secondary | ICD-10-CM

## 2016-02-18 NOTE — Patient Instructions (Addendum)
Medication Instructions:  Your physician recommends that you continue on your current medications as directed. Please refer to the Current Medication list given to you today.   Labwork: None ordered   Testing/Procedures: None ordered   Follow-Up: Your physician wants you to follow-up in: 12 months with Kayla Marshall, NP You will receive a reminder letter in the mail two months in advance. If you don't receive a letter, please call our office to schedule the follow-up appointment.  Remote monitoring is used to monitor your Pacemaker  from home. This monitoring reduces the number of office visits required to check your device to one time per year. It allows [place] to keep an eye on the functioning of your device to ensure it is working properly. You are scheduled for a device check from home on 05/19/16. You may send your transmission at any time that day. If you have a wireless device, the transmission will be sent automatically. After your physician reviews your transmission, you will receive a postcard with your next transmission date.       Any Other Special Instructions Will Be Listed Below (If Applicable).     If you need a refill on your cardiac medications before your next appointment, please call your pharmacy.

## 2016-02-18 NOTE — Progress Notes (Signed)
PCP: [name], MD  Kayla Davis is a 82 y.o. female who presents today for routine electrophysiology followup.  She seems to be doing well.  She has rare and short dizziness. Today, she denies symptoms of palpitations, presyncope, or syncope.  She has chronic pain and swelling in her L leg.  The patient is otherwise without complaint today.   Past Medical History:  Diagnosis Date  . Anemia, unspecified   . Anxiety state, unspecified   . Aortic valve disorders   . Backache, unspecified   . Bradycardia    s/p PPM  . Bursitis   . Complication of anesthesia    has had difficulty awakening in past; pt states did well with the anesthesia given during her carotid surgery   . Diverticulosis of colon (without mention of hemorrhage)   . Heart murmur   . Hemorrhoids   . Herpes zoster with other nervous system complications(053.19)   . History of frequent urinary tract infections   . History of measles as a child   . History of mumps as a child   . History of shingles   . Hyperlipidemia   . Hypertension   . Irritable bowel syndrome   . Menopause   . Osteoarthrosis, unspecified whether generalized or localized, unspecified site   . Osteoporosis, unspecified   . Other abnormal glucose   . Pain in limb   . Peripheral vascular disease ([place])   . Presence of permanent cardiac pacemaker   . TIA (transient ischemic attack)   . Unspecified chronic bronchitis   . Unspecified gastritis and gastroduodenitis without mention of hemorrhage   . Unspecified hearing loss   . Unspecified hereditary and idiopathic peripheral neuropathy   . Unspecified hypothyroidism   . Urinary incontinence   . Varicose veins    Past Surgical History:  Procedure Laterality Date  . APPENDECTOMY    . CATARACT EXTRACTION, BILATERAL  end of June 2013  . CATARACT EXTRACTION, BILATERAL  beginning of July 2013  . CHOLECYSTECTOMY    . DILATION AND CURETTAGE OF UTERUS    . ENDARTERECTOMY Right 01/20/2014   Procedure: RIGHT CAROTID ARTERY ENDARTERECTOMY WITH DACRON PATCH ANGIOPLASTY;  Surgeon: Mal Misty, MD;  Location: [place];  Service: Vascular;  Laterality: Right;  . PACEMAKER INSERTION     MDT  . TONSILLECTOMY    . TOTAL HIP ARTHROPLASTY Left 03/23/2015   Procedure: TOTAL HIP ARTHROPLASTY ANTERIOR APPROACH;  Surgeon: Gaynelle Arabian, MD;  Location: [facility];  Service: Orthopedics;  Laterality: Left;  [name] VAGINAL HYSTERECTOMY      Current Outpatient Prescriptions  Medication Sig Dispense Refill  . aspirin 81 MG tablet Take 81 mg by mouth daily.    . fluticasone (FLONASE ALLERGY RELIEF) 50 MCG/ACT nasal spray Place into both nostrils daily.    . hydroxypropyl methylcellulose / hypromellose (ISOPTO TEARS / GONIOVISC) 2.5 % ophthalmic solution Place 1 drop into both eyes 2 (two) times daily. Reported on 05/31/2015    . ipratropium (ATROVENT) 0.06 % nasal spray Place 2 sprays into both nostrils 3 (three) times daily. 45 mL 0  . Lancets (ONETOUCH ULTRASOFT) lancets Use to check blood sugars once a day Dx E11.9 100 each 3  . levothyroxine (SYNTHROID) 125 MCG tablet Take 1 tablet (125 mcg total) by mouth daily before breakfast. 90 tablet 3  . loratadine (CLARITIN) 10 MG tablet Take 10 mg by mouth daily as needed for allergies.    [name] losartan-hydrochlorothiazide (HYZAAR) 50-12.5 MG tablet Take 0.5 tablets  by mouth daily. 45 tablet 3  . meclizine (ANTIVERT) 12.5 MG tablet Take 1 tablet (12.5 mg total) by mouth 3 (three) times daily as needed for dizziness. 30 tablet 0  . ONE TOUCH ULTRA TEST test strip TEST ONCE DAILY AS DIRECTED 50 each 5  . polyethylene glycol (MIRALAX / GLYCOLAX) packet Take 17 g by mouth daily as needed (constipation).     . pregabalin (LYRICA) 75 MG capsule Take 75 mg by mouth at bedtime.    . simvastatin (ZOCOR) 40 MG tablet Take 1 tablet (40 mg total) by mouth daily with supper. 90 tablet 1  . sodium chloride (OCEAN) 0.65 % SOLN nasal spray Place 1 spray into both nostrils 2 (two)  times daily as needed for congestion. Reported on 05/31/2015     No current facility-administered medications for this visit.     Physical Exam: Vitals:   02/18/16 1444  BP: 114/76  Pulse: 78  Weight: 150 lb 12.8 oz (68.4 kg)  Height: 5\' 5"  (1.651 m)    GEN- The patient is elderly appearing, alert and oriented x 3 today.   Head- normocephalic, atraumatic Neck- R CEA site is healing well Eyes-  Sclera clear, conjunctiva pink Ears- hearing intact Oropharynx- clear Lungs- Clear to ausculation bilaterally, normal work of breathing Chest- pacemaker pocket is well healed Heart- Regular rate and rhythm  GI- soft, NT, ND, + BS Extremities- no clubbing, cyanosis, +1 L leg edema   Pacemaker interrogation- reviewed in detail today,  See PACEART report  Assessment and Plan:  1. Sick sinus bradycardia Normal pacemaker function See Claudia Desanctis Art report  2. Venous insufficiency Support hose encouraged  3. HTN Stable No change required today  carelink Return to see EP NP in 1 year  Thompson Grayer MD, [facility] 02/18/2016 3:25 PM

## 2016-02-20 ENCOUNTER — Ambulatory Visit (INDEPENDENT_AMBULATORY_CARE_PROVIDER_SITE_OTHER): Payer: MEDICARE | Admitting: Podiatry

## 2016-02-20 ENCOUNTER — Other Ambulatory Visit: Payer: Self-pay | Admitting: *Deleted

## 2016-02-20 DIAGNOSIS — I63319 Cerebral infarction due to thrombosis of unspecified middle cerebral artery: Secondary | ICD-10-CM

## 2016-02-20 DIAGNOSIS — L03039 Cellulitis of unspecified toe: Secondary | ICD-10-CM | POA: Diagnosis not present

## 2016-02-20 DIAGNOSIS — M79676 Pain in unspecified toe(s): Secondary | ICD-10-CM

## 2016-02-20 DIAGNOSIS — L600 Ingrowing nail: Secondary | ICD-10-CM | POA: Diagnosis not present

## 2016-02-20 MED ORDER — IPRATROPIUM BROMIDE 0.06 % NA SOLN: 2.0000 | Freq: Three times a day (TID) | NASAL | 1 refills | Status: DC

## 2016-02-20 NOTE — Patient Instructions (Signed)

## 2016-02-22 LAB — CUP PACEART INCLINIC DEVICE CHECK
Date Time Interrogation Session: 20171110092110
Implantable Lead Implant Date: 20111031
Implantable Lead Location: 753860
Implantable Lead Model: 4469
Implantable Lead Model: 4470
Implantable Lead Serial Number: 548557
Implantable Lead Serial Number: 686076
MDC IDC LEAD IMPLANT DT: 20111031
MDC IDC LEAD LOCATION: 753859
MDC IDC PG IMPLANT DT: 20111031

## 2016-02-24 NOTE — Progress Notes (Signed)
Subjective: Patient presents today for follow-up evaluation of a partial permanent toenail removal to the right great toe. Patient states she's feeling well however she has a new complaint of a left great toenail ingrown. Patient presents today for further treatment and evaluation.  Objective:  General: Well developed, nourished, in no acute distress, alert and oriented x3   Dermatology: Skin is warm, dry and supple bilateral. Left great toe medial border appears to be erythematous with evidence of an ingrowing nail. Purulent drainage noted with intruding nail into the respective nail fold. Pain on palpation noted to the border of the nail fold. The remaining nails appear unremarkable at this time. There are no open sores, lesions.  Vascular: Dorsalis Pedis artery and Posterior Tibial artery pedal pulses palpable. No lower extremity edema noted.   Neruologic: Grossly intact via light touch bilateral.  Musculoskeletal: Muscular strength within normal limits in all groups bilateral. Normal range of motion noted to all pedal and ankle joints.   Assesement: #1 paronychia left great toe medial border #2 ingrowing nail left great toe medial border #3 status post 2 weeks right great toe permanent partial nail avulsion-doing well   Plan of Care:  1. Patient evaluated.  2. Discussed treatment alternatives and plan of care. Explained nail avulsion procedure and post procedure course to patient. 3. Patient opted for permanent partial nail avulsion.  4. Prior to procedure, local anesthesia infiltration utilized using 3 ml of a 50:50 mixture of 2% plain lidocaine and 0.5% plain marcaine in a normal hallux block fashion and a betadine prep performed.  5. Partial permanent nail avulsion with chemical matrixectomy performed using [id number] applications of phenol followed by alcohol flush.  6. Light dressing applied. 7. Return to clinic in 2 weeks.   Dr. Edrick Kins [facility]

## 2016-02-26 ENCOUNTER — Other Ambulatory Visit (INDEPENDENT_AMBULATORY_CARE_PROVIDER_SITE_OTHER): Payer: MEDICARE

## 2016-02-26 DIAGNOSIS — R739 Hyperglycemia, unspecified: Secondary | ICD-10-CM

## 2016-02-26 DIAGNOSIS — R42 Dizziness and giddiness: Secondary | ICD-10-CM

## 2016-02-26 LAB — BASIC METABOLIC PANEL
BUN: 19 mg/dL (ref 6–23)
CALCIUM: 9.6 mg/dL (ref 8.4–10.5)
CO2: 30 mEq/L (ref 19–32)
Chloride: 106 mEq/L (ref 96–112)
Creatinine, Ser: 0.78 mg/dL (ref 0.40–1.20)
GFR: 73.55 mL/min (ref >60.00)
GLUCOSE: 104 mg/dL — AB (ref 70–99)
Potassium: 4.7 mEq/L (ref 3.5–5.1)
SODIUM: 141 meq/L (ref 135–145)

## 2016-02-26 LAB — T4, FREE: FREE T4: 1.06 ng/dL (ref 0.60–1.60)

## 2016-02-26 LAB — TSH: TSH: 0.2 u[IU]/mL — AB (ref 0.35–4.50)

## 2016-02-26 LAB — HEMOGLOBIN A1C: Hgb A1c MFr Bld: 6.3 % (ref 4.6–6.5)

## 2016-02-29 ENCOUNTER — Ambulatory Visit (INDEPENDENT_AMBULATORY_CARE_PROVIDER_SITE_OTHER): Payer: MEDICARE | Admitting: Internal Medicine

## 2016-02-29 ENCOUNTER — Encounter: Payer: Self-pay | Admitting: Internal Medicine

## 2016-02-29 DIAGNOSIS — G609 Hereditary and idiopathic neuropathy, unspecified: Secondary | ICD-10-CM

## 2016-02-29 DIAGNOSIS — E119 Type 2 diabetes mellitus without complications: Secondary | ICD-10-CM | POA: Insufficient documentation

## 2016-02-29 DIAGNOSIS — K5903 Drug induced constipation: Secondary | ICD-10-CM

## 2016-02-29 DIAGNOSIS — I63319 Cerebral infarction due to thrombosis of unspecified middle cerebral artery: Secondary | ICD-10-CM

## 2016-02-29 DIAGNOSIS — E034 Atrophy of thyroid (acquired): Secondary | ICD-10-CM | POA: Diagnosis not present

## 2016-02-29 MED ORDER — POLYETHYLENE GLYCOL 3350 17 GM/SCOOP PO POWD: 17.0000 g | Freq: Every day | ORAL | 11 refills | Status: DC

## 2016-02-29 MED ORDER — PREGABALIN 75 MG PO CAPS: 75.0000 mg | ORAL_CAPSULE | Freq: Every day | ORAL | 3 refills | Status: DC

## 2016-02-29 NOTE — Progress Notes (Signed)
Subjective:  Patient ID: Kayla Davis, female    DOB: March 28, 1923  Age: 82 y.o. MRN: [id number]  CC: No chief complaint on file.   HPI RASHMI STURDEVANT presents for HTN, dyslipidemia, neuropathy f/u  Outpatient Medications Prior to Visit  Medication Sig Dispense Refill  . aspirin 81 MG tablet Take 81 mg by mouth daily.    . fluticasone (FLONASE ALLERGY RELIEF) 50 MCG/ACT nasal spray Place into both nostrils daily.    . hydroxypropyl methylcellulose / hypromellose (ISOPTO TEARS / GONIOVISC) 2.5 % ophthalmic solution Place 1 drop into both eyes 2 (two) times daily. Reported on 05/31/2015    . ipratropium (ATROVENT) 0.06 % nasal spray Place 2 sprays into both nostrils 3 (three) times daily. 45 mL 1  . Lancets (ONETOUCH ULTRASOFT) lancets Use to check blood sugars once a day Dx E11.9 100 each 3  . levothyroxine (SYNTHROID) 125 MCG tablet Take 1 tablet (125 mcg total) by mouth daily before breakfast. 90 tablet 3  . loratadine (CLARITIN) 10 MG tablet Take 10 mg by mouth daily as needed for allergies.    [name] losartan-hydrochlorothiazide (HYZAAR) 50-12.5 MG tablet Take 0.5 tablets by mouth daily. 45 tablet 3  . ONE TOUCH ULTRA TEST test strip TEST ONCE DAILY AS DIRECTED 50 each 5  . polyethylene glycol (MIRALAX / GLYCOLAX) packet Take 17 g by mouth daily as needed (constipation).     . pregabalin (LYRICA) 75 MG capsule Take 75 mg by mouth at bedtime.    . simvastatin (ZOCOR) 40 MG tablet Take 1 tablet (40 mg total) by mouth daily with supper. 90 tablet 1  . sodium chloride (OCEAN) 0.65 % SOLN nasal spray Place 1 spray into both nostrils 2 (two) times daily as needed for congestion. Reported on 05/31/2015    . meclizine (ANTIVERT) 12.5 MG tablet Take 1 tablet (12.5 mg total) by mouth 3 (three) times daily as needed for dizziness. (Patient not taking: Reported on 02/29/2016) 30 tablet 0   No facility-administered medications prior to visit.     ROS Review of Systems  Constitutional: Negative  for activity change, appetite change, chills, fatigue and unexpected weight change.  HENT: Negative for congestion, mouth sores and sinus pressure.   Eyes: Negative for visual disturbance.  Respiratory: Negative for cough and chest tightness.   Gastrointestinal: Positive for constipation. Negative for abdominal pain and nausea.  Genitourinary: Negative for difficulty urinating, frequency and vaginal pain.  Musculoskeletal: Negative for back pain and gait problem.  Skin: Negative for pallor and rash.  Neurological: Negative for dizziness, tremors, weakness, numbness and headaches.  Hematological: Bruises/bleeds easily.  Psychiatric/Behavioral: Positive for decreased concentration. Negative for confusion and sleep disturbance.    Objective:  BP (!) 150/70   Pulse 64   Wt 151 lb (68.5 kg)   LMP  (LMP Unknown)   SpO2 95%   BMI 25.13 kg/m   BP Readings from Last 3 Encounters:  02/29/16 (!) 150/70  02/18/16 114/76  12/11/15 130/60    Wt Readings from Last 3 Encounters:  02/29/16 151 lb (68.5 kg)  02/18/16 150 lb 12.8 oz (68.4 kg)  12/11/15 149 lb (67.6 kg)    Physical Exam  Constitutional: She appears well-developed. No distress.  HENT:  Head: Normocephalic.  Right Ear: External ear normal.  Left Ear: External ear normal.  Nose: Nose normal.  Mouth/Throat: Oropharynx is clear and moist.  Eyes: Conjunctivae are normal. Pupils are equal, round, and reactive to light. Right eye exhibits no discharge. Left  eye exhibits no discharge.  Neck: Normal range of motion. Neck supple. No JVD present. No tracheal deviation present. No thyromegaly present.  Cardiovascular: Normal rate, regular rhythm and normal heart sounds.   Pulmonary/Chest: No stridor. No respiratory distress. She has no wheezes.  Abdominal: Soft. Bowel sounds are normal. She exhibits no distension and no mass. There is no tenderness. There is no rebound and no guarding.  Musculoskeletal: She exhibits no edema or  tenderness.  Lymphadenopathy:    She has no cervical adenopathy.  Neurological: She displays normal reflexes. No cranial nerve deficit. She exhibits normal muscle tone. Coordination normal.  Skin: No rash noted. No erythema.  Psychiatric: She has a normal mood and affect. Her behavior is normal. Judgment and thought content normal.    Lab Results  Component Value Date   WBC 6.4 08/20/2015   HGB 12.0 08/20/2015   HCT 35.9 (L) 08/20/2015   PLT 205.0 08/20/2015   GLUCOSE 104 (H) 02/26/2016   CHOL 161 01/17/2014   TRIG 56 01/17/2014   HDL 68 01/17/2014   LDLDIRECT 143.0 10/07/2007   LDLCALC 82 01/17/2014   ALT 15 03/16/2015   AST 18 03/16/2015   NA 141 02/26/2016   K 4.7 02/26/2016   CL 106 02/26/2016   CREATININE 0.78 02/26/2016   BUN 19 02/26/2016   CO2 30 02/26/2016   TSH 0.20 (L) 02/26/2016   INR 1.06 03/16/2015   HGBA1C 6.3 02/26/2016    Dg Chest 2 View  Result Date: 08/16/2015 CLINICAL DATA:  Cough. EXAM: CHEST  2 VIEW COMPARISON:  Aug 30, 2014. FINDINGS: The heart size and mediastinal contours are within normal limits. Right-sided pacemaker is unchanged in position. No pneumothorax or pleural effusion is noted. Mild bibasilar interstitial densities are noted which may represent scarring or possibly edema. Anterior osteophyte formation is noted throughout the thoracic spine. IMPRESSION: Mild bibasilar interstitial densities which may represent scarring or possibly edema. Electronically Signed   By: Marijo Conception, M.D.   On: 08/16/2015 16:09    Assessment & Plan:   There are no diagnoses linked to this encounter. I am having Ms. Quentin Cornwall maintain her sodium chloride, pregabalin, hydroxypropyl methylcellulose / hypromellose, polyethylene glycol, levothyroxine, fluticasone, aspirin, losartan-hydrochlorothiazide, ONE TOUCH ULTRA TEST, onetouch ultrasoft, loratadine, simvastatin, meclizine, and ipratropium.  No orders of the defined types were placed in this  encounter.    Follow-up: No Follow-up on file.  [name], MD

## 2016-02-29 NOTE — Assessment & Plan Note (Signed)
Chronic  On Levothroid Low TSH, nl FT4

## 2016-02-29 NOTE — Assessment & Plan Note (Signed)
Lyrica at [facility]

## 2016-02-29 NOTE — Progress Notes (Signed)
Pre visit review using our clinic review tool, if applicable. No additional management support is needed unless otherwise documented below in the visit note. 

## 2016-02-29 NOTE — Assessment & Plan Note (Signed)
  On diet  

## 2016-02-29 NOTE — Assessment & Plan Note (Signed)
Miralax

## 2016-03-05 ENCOUNTER — Encounter: Payer: Self-pay | Admitting: Podiatry

## 2016-03-05 ENCOUNTER — Ambulatory Visit (INDEPENDENT_AMBULATORY_CARE_PROVIDER_SITE_OTHER): Payer: MEDICARE | Admitting: Podiatry

## 2016-03-05 DIAGNOSIS — L600 Ingrowing nail: Secondary | ICD-10-CM

## 2016-03-05 DIAGNOSIS — M79676 Pain in unspecified toe(s): Secondary | ICD-10-CM

## 2016-03-05 DIAGNOSIS — L03039 Cellulitis of unspecified toe: Secondary | ICD-10-CM | POA: Diagnosis not present

## 2016-03-05 DIAGNOSIS — S91109D Unspecified open wound of unspecified toe(s) without damage to nail, subsequent encounter: Secondary | ICD-10-CM

## 2016-03-05 NOTE — Progress Notes (Signed)

## 2016-03-12 ENCOUNTER — Other Ambulatory Visit: Payer: Self-pay | Admitting: Internal Medicine

## 2016-03-17 ENCOUNTER — Other Ambulatory Visit: Payer: Self-pay

## 2016-03-17 MED ORDER — PREGABALIN 75 MG PO CAPS: 75.0000 mg | ORAL_CAPSULE | Freq: Two times a day (BID) | ORAL | 3 refills | Status: DC

## 2016-03-17 NOTE — Telephone Encounter (Signed)
Ok Pls call in Thx 

## 2016-03-17 NOTE — Telephone Encounter (Signed)
Pt states that she needs #180 of lyrica because she takes it more than 1 daily.   Please advise if increase per day is allowed.

## 2016-03-18 MED ORDER — PREGABALIN 75 MG PO CAPS: 75.0000 mg | ORAL_CAPSULE | Freq: Two times a day (BID) | ORAL | 3 refills | Status: DC

## 2016-03-18 NOTE — Telephone Encounter (Signed)
rx printed and faxed to mail order. Pt informed of same.

## 2016-03-25 MED ORDER — PREGABALIN 75 MG PO CAPS: 75.0000 mg | ORAL_CAPSULE | Freq: Two times a day (BID) | ORAL | 3 refills | Status: DC

## 2016-03-25 NOTE — Telephone Encounter (Signed)
Patients daughter called in. States that mail order pharmacy has not received the revised #180 count. Please follow up to make sure that they have received.

## 2016-03-25 NOTE — Addendum Note (Signed)
Addended by: Cresenciano Lick on: 03/25/2016 12:39 PM   Modules accepted: Orders

## 2016-03-25 NOTE — Telephone Encounter (Signed)
I called [facility] and was transferred 5 times because no one I spoke to was able to get me to a [facility] so I could give them verbal refill . [phone]   Lyrica rx re printed and faxed back to [place]. See meds.

## 2016-03-27 ENCOUNTER — Telehealth: Payer: Self-pay | Admitting: Internal Medicine

## 2016-03-27 NOTE — Telephone Encounter (Signed)
Patient states she is having issues getting lyrica from [place].  Patient is requesting office to contact pharmacy to see what is going on.

## 2016-03-28 NOTE — Telephone Encounter (Signed)
Called [facility] spoke w/rep Jaquelyn Bitter. He states they have revieved the scripts for 12/12, but pt is not due for refill until 04/06/16. Pt last received med on 11/19. Thye have place the rx on hold will be shipped out 04/06/16. Called pt inform status on Lyrica rx...Johny Chess

## 2016-04-02 ENCOUNTER — Ambulatory Visit (INDEPENDENT_AMBULATORY_CARE_PROVIDER_SITE_OTHER): Payer: MEDICARE | Admitting: Podiatry

## 2016-04-02 ENCOUNTER — Encounter: Payer: Self-pay | Admitting: Podiatry

## 2016-04-02 DIAGNOSIS — M79676 Pain in unspecified toe(s): Secondary | ICD-10-CM

## 2016-04-02 DIAGNOSIS — S91209D Unspecified open wound of unspecified toe(s) with damage to nail, subsequent encounter: Secondary | ICD-10-CM

## 2016-04-02 DIAGNOSIS — S91109D Unspecified open wound of unspecified toe(s) without damage to nail, subsequent encounter: Secondary | ICD-10-CM

## 2016-04-02 NOTE — Progress Notes (Signed)
Subjective: Patient presents today 2 weeks post ingrown nail permanent nail avulsion procedure. Patient states that the toe and nail fold is feeling much better.  Objective: Skin is warm, dry and supple. Nail and respective nail fold appears to be healing appropriately. Open wound to the associated nail fold with a granular wound base and moderate amount of fibrotic tissue. Minimal drainage noted. Mild erythema around the periungual region likely due to phenol chemical matricectomy.  Assessment: #1 postop permanent partial nail avulsion left great toe medial border #2 open wound periungual nail fold of respective digit.   Plan of care: #1 patient was evaluated  #2 debridement of open wound was performed to the periungual border of the respective toe using a currette. Antibiotic ointment and Band-Aid was applied. #3 patient is to return to clinic on a PRN  basis.

## 2016-05-19 ENCOUNTER — Telehealth: Payer: Self-pay | Admitting: Cardiology

## 2016-05-19 ENCOUNTER — Ambulatory Visit (INDEPENDENT_AMBULATORY_CARE_PROVIDER_SITE_OTHER): Payer: MEDICARE | Admitting: *Deleted

## 2016-05-19 DIAGNOSIS — I495 Sick sinus syndrome: Secondary | ICD-10-CM

## 2016-05-19 NOTE — Telephone Encounter (Signed)
LMOVM reminding pt to send remote transmission.   

## 2016-05-20 LAB — CUP PACEART REMOTE DEVICE CHECK
Battery Remaining Longevity: 107 mo
Battery Voltage: 2.78 V
Brady Statistic AP VP Percent: 0 %
Brady Statistic AP VS Percent: 23 %
Brady Statistic AS VP Percent: 0 %
Implantable Lead Implant Date: 20111031
Implantable Lead Location: 753860
Implantable Lead Model: 4469
Implantable Lead Model: 4470
Implantable Lead Serial Number: 548557
Lead Channel Impedance Value: 479 Ohm
Lead Channel Pacing Threshold Amplitude: 0.5 V
Lead Channel Pacing Threshold Pulse Width: 0.4 ms
Lead Channel Pacing Threshold Pulse Width: 0.4 ms
Lead Channel Setting Pacing Pulse Width: 0.4 ms
MDC IDC LEAD IMPLANT DT: 20111031
MDC IDC LEAD LOCATION: 753859
MDC IDC LEAD SERIAL: 686076
MDC IDC MSMT BATTERY IMPEDANCE: 397 Ohm
MDC IDC MSMT LEADCHNL RV IMPEDANCE VALUE: 527 Ohm
MDC IDC MSMT LEADCHNL RV PACING THRESHOLD AMPLITUDE: 0.625 V
MDC IDC PG IMPLANT DT: 20111031
MDC IDC SESS DTM: 20180206025734
MDC IDC SET LEADCHNL RA PACING AMPLITUDE: 2 V
MDC IDC SET LEADCHNL RV PACING AMPLITUDE: 2.5 V
MDC IDC SET LEADCHNL RV SENSING SENSITIVITY: 5.6 mV
MDC IDC STAT BRADY AS VS PERCENT: 76 %

## 2016-05-20 NOTE — Progress Notes (Signed)
Remote pacemaker transmission.   

## 2016-05-21 ENCOUNTER — Encounter: Payer: Self-pay | Admitting: Cardiology

## 2016-05-26 ENCOUNTER — Telehealth: Payer: Self-pay | Admitting: Internal Medicine

## 2016-05-26 DIAGNOSIS — Z0000 Encounter for general adult medical examination without abnormal findings: Secondary | ICD-10-CM

## 2016-05-26 NOTE — Telephone Encounter (Signed)
Routing to dr [employer], please advise which labs you want entered and for what reason, I will enter for you--thanks

## 2016-05-26 NOTE — Telephone Encounter (Signed)
Pt is wanting to come in and get blood work done now for the appt she has the 19th.  Can these be put in now?

## 2016-05-26 NOTE — Telephone Encounter (Signed)
OK BMET Thx

## 2016-05-27 ENCOUNTER — Other Ambulatory Visit (INDEPENDENT_AMBULATORY_CARE_PROVIDER_SITE_OTHER): Payer: MEDICARE

## 2016-05-27 ENCOUNTER — Encounter: Payer: Self-pay | Admitting: Family

## 2016-05-27 DIAGNOSIS — Z0000 Encounter for general adult medical examination without abnormal findings: Secondary | ICD-10-CM | POA: Diagnosis not present

## 2016-05-27 DIAGNOSIS — I10 Essential (primary) hypertension: Secondary | ICD-10-CM

## 2016-05-27 LAB — BASIC METABOLIC PANEL
BUN: 20 mg/dL (ref 6–23)
CHLORIDE: 102 meq/L (ref 96–112)
CO2: 30 mEq/L (ref 19–32)
CREATININE: 0.72 mg/dL (ref 0.40–1.20)
Calcium: 9.5 mg/dL (ref 8.4–10.5)
GFR: 80.62 mL/min (ref >60.00)
Glucose, Bld: 111 mg/dL — ABNORMAL HIGH (ref 70–99)
Potassium: 4.1 mEq/L (ref 3.5–5.1)
Sodium: 138 mEq/L (ref 135–145)

## 2016-05-27 NOTE — Telephone Encounter (Signed)
Labs entered.

## 2016-05-27 NOTE — Telephone Encounter (Signed)
Patient called in again about this. She states she is heading this way to do the blood work. It looks like its ok with Dr. Camila Li but I do not see the orders. Please follow up, Thank you.

## 2016-05-30 ENCOUNTER — Telehealth: Payer: Self-pay | Admitting: Internal Medicine

## 2016-05-30 NOTE — Telephone Encounter (Signed)
New message    Pt wants to know if the pacemaker is transmitting any changes in her heart rhythm ? If so she would like a copy to take to her PCP

## 2016-05-30 NOTE — Telephone Encounter (Signed)
2nd attempt to reach pt, voicemail left

## 2016-05-30 NOTE — Telephone Encounter (Signed)
LVM for patient

## 2016-06-01 ENCOUNTER — Emergency Department (HOSPITAL_COMMUNITY)
Admission: EM | Admit: 2016-06-01 | Discharge: 2016-06-01 | Disposition: A | Discharge status: Home / Self Care | Payer: MEDICARE | Attending: Emergency Medicine | Admitting: Emergency Medicine

## 2016-06-01 ENCOUNTER — Encounter (HOSPITAL_COMMUNITY): Payer: Self-pay | Admitting: Emergency Medicine

## 2016-06-01 ENCOUNTER — Emergency Department (HOSPITAL_COMMUNITY): Payer: MEDICARE

## 2016-06-01 DIAGNOSIS — Z7982 Long term (current) use of aspirin: Secondary | ICD-10-CM | POA: Insufficient documentation

## 2016-06-01 DIAGNOSIS — H579 Unspecified disorder of eye and adnexa: Secondary | ICD-10-CM

## 2016-06-01 DIAGNOSIS — E039 Hypothyroidism, unspecified: Secondary | ICD-10-CM | POA: Insufficient documentation

## 2016-06-01 DIAGNOSIS — H53142 Visual discomfort, left eye: Secondary | ICD-10-CM | POA: Insufficient documentation

## 2016-06-01 DIAGNOSIS — Z96642 Presence of left artificial hip joint: Secondary | ICD-10-CM | POA: Insufficient documentation

## 2016-06-01 DIAGNOSIS — H539 Unspecified visual disturbance: Secondary | ICD-10-CM | POA: Diagnosis not present

## 2016-06-01 DIAGNOSIS — I10 Essential (primary) hypertension: Secondary | ICD-10-CM | POA: Insufficient documentation

## 2016-06-01 DIAGNOSIS — R42 Dizziness and giddiness: Secondary | ICD-10-CM | POA: Diagnosis present

## 2016-06-01 DIAGNOSIS — E119 Type 2 diabetes mellitus without complications: Secondary | ICD-10-CM | POA: Insufficient documentation

## 2016-06-01 DIAGNOSIS — R531 Weakness: Secondary | ICD-10-CM | POA: Diagnosis not present

## 2016-06-01 DIAGNOSIS — Z950 Presence of cardiac pacemaker: Secondary | ICD-10-CM | POA: Insufficient documentation

## 2016-06-01 DIAGNOSIS — Z8673 Personal history of transient ischemic attack (TIA), and cerebral infarction without residual deficits: Secondary | ICD-10-CM | POA: Diagnosis not present

## 2016-06-01 DIAGNOSIS — H578 Other specified disorders of eye and adnexa: Secondary | ICD-10-CM | POA: Diagnosis not present

## 2016-06-01 HISTORY — DX: Type 2 diabetes mellitus without complications: E11.9

## 2016-06-01 LAB — BASIC METABOLIC PANEL
Anion gap: 7 (ref 5–15)
BUN: 26 mg/dL — AB (ref 6–20)
CO2: 30 mmol/L (ref 22–32)
Calcium: 9.9 mg/dL (ref 8.9–10.3)
Chloride: 100 mmol/L — ABNORMAL LOW (ref 101–111)
Creatinine, Ser: 0.85 mg/dL (ref 0.44–1.00)
GFR, EST NON AFRICAN AMERICAN: 58 mL/min — AB (ref >60)
Glucose, Bld: 131 mg/dL — ABNORMAL HIGH (ref 65–99)
Potassium: 4.7 mmol/L (ref 3.5–5.1)
SODIUM: 137 mmol/L (ref 135–145)

## 2016-06-01 LAB — CBC
HEMATOCRIT: 37.1 % (ref 36.0–46.0)
Hemoglobin: 12.4 g/dL (ref 12.0–15.0)
MCH: 31.6 pg (ref 26.0–34.0)
MCHC: 33.4 g/dL (ref 30.0–36.0)
MCV: 94.4 fL (ref 78.0–100.0)
PLATELETS: 212 10*3/uL (ref 150–400)
RBC: 3.93 MIL/uL (ref 3.87–5.11)
RDW: 13.9 % (ref 11.5–15.5)
WBC: 6.5 10*3/uL (ref 4.0–10.5)

## 2016-06-01 LAB — CBG MONITORING, ED: Glucose-Capillary: 141 mg/dL — ABNORMAL HIGH (ref 65–99)

## 2016-06-01 MED ORDER — IOPAMIDOL (ISOVUE-370) INJECTION 76%
INTRAVENOUS | Status: AC
  Administered 2016-06-01: 50 mL
  Filled 2016-06-01: qty 50

## 2016-06-01 NOTE — ED Triage Notes (Signed)
Pt. Stated, I was at [place] and my left eye was twitching really bad . It came and then went away and came back.It came for about 5-10 min. It happened 2-3 times. Pt. Also stated, I've been dizzy off and on for about a month.

## 2016-06-01 NOTE — ED Provider Notes (Signed)
Emergency Department Provider Note   I have reviewed the triage vital signs and the nursing notes.   HISTORY  Chief Complaint Eye Problem and Dizziness   HPI Kayla Davis is a 83 y.o. female with PMH of bradycardia s/p pacemaker, HLD, HTN, and TIA with right carotid endarterectomy resents to the emergency department for evaluation of intermittent lightheadedness and vision change/twitching in the left eye. The patient states she was at church and feeling "bad" but cannot explain exactly how. The patient states that she went to the store after church to look for cards when her left eye began twitching. She had some associated visual disturbance. This lasted for 5-10 minutes with no modifying factors. She drove home. She denies any associated weakness, numbness, difficult walking, speech changes. She notes 2 additional episodes of twitching and visual disturbance since the initial episode. Patient states that she is concerned because this is how her TIA presented in 2015 which required a carotid endarterectomy on the right side.    Past Medical History:  Diagnosis Date  . Anemia, unspecified   . Anxiety state, unspecified   . Aortic valve disorders   . Backache, unspecified   . Bradycardia    s/p PPM  . Bursitis   . Complication of anesthesia    has had difficulty awakening in past; pt states did well with the anesthesia given during her carotid surgery   . Diabetes mellitus without complication ([place])   . Diverticulosis of colon (without mention of hemorrhage)   . Heart murmur   . Hemorrhoids   . Herpes zoster with other nervous system complications(053.19)   . History of frequent urinary tract infections   . History of measles as a child   . History of mumps as a child   . History of shingles   . Hyperlipidemia   . Hypertension   . Irritable bowel syndrome   . Menopause   . Osteoarthrosis, unspecified whether generalized or localized, unspecified site   .  Osteoporosis, unspecified   . Other abnormal glucose   . Pain in limb   . Peripheral vascular disease ([place])   . Presence of permanent cardiac pacemaker   . TIA (transient ischemic attack)   . Unspecified chronic bronchitis   . Unspecified gastritis and gastroduodenitis without mention of hemorrhage   . Unspecified hearing loss   . Unspecified hereditary and idiopathic peripheral neuropathy   . Unspecified hypothyroidism   . Urinary incontinence   . Varicose veins     Patient Active Problem List   Diagnosis Date Noted  . Diet-controlled type 2 diabetes mellitus ([name]) 02/29/2016  . Vertigo 12/11/2015  . Gait disorder 06/27/2015  . OA (osteoarthritis) of hip 03/23/2015  . Preop exam for internal medicine 03/04/2015  . Shortness of breath 02/13/2015  . Rhinitis, atrophic 11/22/2014  . Well adult exam 04/21/2014  . Amaurosis fugax 03/31/2014  . History of CEA (carotid endarterectomy) 03/31/2014  . HLD (hyperlipidemia) 03/31/2014  . Carotid stenosis 02/07/2014  . Cerebral infarction ([place]) 01/16/2014  . Dyslipidemia 01/16/2014  . Weakness generalized 04/30/2013  . CAP (community acquired pneumonia) 04/29/2013  . Edema 03/22/2013  . Urinary incontinence in female 03/22/2013  . Constipation 10/01/2012  . UTI (urinary tract infection) 10/01/2012  . Diarrhea 08/26/2012  . Cerumen impaction 12/11/2011  . Pacemaker-Medtronic 07/30/2010  . SINUS BRADYCARDIA 05/17/2010  . DYSURIA 01/29/2010  . Aortic valve disorders 07/31/2009  . Venous (peripheral) insufficiency 07/31/2009  . Hyponatremia 07/01/2009  . LEG CRAMPS 06/12/2009  .  LEG PAIN, BILATERAL 02/01/2009  . Backache 08/24/2008  . Anemia 06/21/2008  . DEGENERATIVE JOINT DISEASE 02/19/2008  . CHEST WALL PAIN, HX OF 02/19/2008  . IRRITABLE BOWEL SYNDROME 12/28/2007  . DIABETES MELLITUS, BORDERLINE 11/06/2007  . GASTRITIS 07/08/2007  . POSTHERPETIC NEURALGIA 07/01/2007  . Herpes zoster without mention of complication  [id number]  . Hypothyroidism 06/08/2007  . HYPERLIPIDEMIA 06/08/2007  . Anxiety state 06/08/2007  . Hereditary and idiopathic peripheral neuropathy 06/08/2007  . HEARING LOSS 06/08/2007  . Essential hypertension 06/08/2007  . Transient cerebral ischemia 06/08/2007  . BRONCHITIS, RECURRENT 06/08/2007  . DIVERTICULOSIS OF COLON 06/08/2007  . Osteoporosis 06/08/2007    Past Surgical History:  Procedure Laterality Date  . APPENDECTOMY    . CATARACT EXTRACTION, BILATERAL  end of June 2013  . CATARACT EXTRACTION, BILATERAL  beginning of July 2013  . CHOLECYSTECTOMY    . DILATION AND CURETTAGE OF UTERUS    . ENDARTERECTOMY Right 01/20/2014   Procedure: RIGHT CAROTID ARTERY ENDARTERECTOMY WITH DACRON PATCH ANGIOPLASTY;  Surgeon: Mal Misty, MD;  Location: [place];  Service: Vascular;  Laterality: Right;  . PACEMAKER INSERTION     MDT  . TONSILLECTOMY    . TOTAL HIP ARTHROPLASTY Left 03/23/2015   Procedure: TOTAL HIP ARTHROPLASTY ANTERIOR APPROACH;  Surgeon: Gaynelle Arabian, MD;  Location: [facility];  Service: Orthopedics;  Laterality: Left;  [name] VAGINAL HYSTERECTOMY      Current Outpatient Rx  . Order #: [id number] Class: Historical Med  . Order #: [id number] Class: Historical Med  . Order #: [id number] Class: Historical Med  . Order #: [id number] Class: Historical Med  . Order #: [id number] Class: Historical Med  . Order #: [id number] Class: Historical Med  . Order #: [id number] Class: Historical Med  . Order #: [id number] Class: Normal  . Order #: [id number] Class: Normal  . Order #: [id number] Class: Historical Med  . Order #: [id number] Class: Normal  . Order #: [id number] Class: Historical Med  . Order #: [id number] Class: Historical Med  . Order #: [id number] Class: Print  . Order #: [id number] Class: Historical Med  . Order #: [id number] Class: Print  . Order #: [id number] Class: Normal  . Order #: [id number] Class: Fax  . Order #: [id number] Class: Normal  . Order #: [id number] Class: Normal     Allergies Penicillins; Alprazolam; Bactrim [sulfamethoxazole-trimethoprim]; Ceclor [cefaclor]; Cephalexin; Ciprofloxacin; Clindamycin/lincomycin; Hydrocodone; Noroxin [norfloxacin]; Sulfonamide derivatives; Tetracyclines & related; and Tramadol  Family History  Problem Relation Age of Onset  . Heart disease Mother   . Heart disease Father   . Diabetes Father   . Cancer Paternal Grandmother     Social History Social History  Substance Use Topics  . Smoking status: Never Smoker  . Smokeless tobacco: Never Used  . Alcohol use No    Review of Systems  Constitutional: No fever/chills Eyes: Positive vision changes and eye twitching.  ENT: No sore throat. Cardiovascular: Denies chest pain. Respiratory: Denies shortness of breath. Gastrointestinal: No abdominal pain.  No nausea, no vomiting.  No diarrhea.  No constipation. Genitourinary: Negative for dysuria. Musculoskeletal: Negative for back pain. Skin: Negative for rash. Neurological: Negative for headaches, focal weakness or numbness.  10-point ROS otherwise negative.  ____________________________________________   PHYSICAL EXAM:  VITAL SIGNS: ED Triage Vitals  Enc Vitals Group     BP 06/01/16 1412 134/71     Pulse Rate 06/01/16 1412 73     Resp 06/01/16 1412 17     Temp 06/01/16 1412 98.4 F (36.9 C)     Temp Source 06/01/16 1412 Oral     SpO2 06/01/16  1412 95 %     Weight 06/01/16 1414 147 lb (66.7 kg)     Height 06/01/16 1414 5\' 5"  (1.651 m)     Pain Score 06/01/16 1414 0   Constitutional: Alert and oriented. Well appearing and in no acute distress. Eyes: Conjunctivae are normal. PERRL. EOMI. Head: Atraumatic. Nose: No congestion/rhinnorhea. Mouth/Throat: Mucous membranes are moist.  Oropharynx non-erythematous. Neck: No stridor.  Cardiovascular: Normal rate, regular rhythm. Good peripheral circulation. Grossly normal heart sounds.   Respiratory: Normal respiratory effort.  No retractions. Lungs  CTAB. Gastrointestinal: Soft and nontender. No distention.  Musculoskeletal: No lower extremity tenderness nor edema. No gross deformities of extremities. Neurologic:  Normal speech and language. No gross focal neurologic deficits are appreciated. No pronator drift. Normal CN exam.  Skin:  Skin is warm, dry and intact. No rash noted. Psychiatric: Mood and affect are normal. Speech and behavior are normal.  ____________________________________________   LABS (all labs ordered are listed, but only abnormal results are displayed)  Labs Reviewed  BASIC METABOLIC PANEL - Abnormal; Notable for the following:       Result Value   Chloride 100 (*)    Glucose, Bld 131 (*)    BUN 26 (*)    GFR calc non Af Amer 58 (*)    All other components within normal limits  CBG MONITORING, ED - Abnormal; Notable for the following:    Glucose-Capillary 141 (*)    All other components within normal limits  CBC   ____________________________________________  EKG   EKG Interpretation  Date/Time:  Sunday June 01 2016 13:59:16 EST Ventricular Rate:  67 PR Interval:  296 QRS Duration: 138 QT Interval:  434 QTC Calculation: 458 R Axis:   -52 Text Interpretation:  Sinus rhythm with sinus arrhythmia with 1st degree A-V block Left axis deviation Left ventricular hypertrophy with QRS widening and repolarization abnormality Possible Lateral infarct , age undetermined Abnormal ECG No STEMI.  Confirmed by LONG MD, JOSHUA [phone]) on 06/01/2016 2:10:24 PM Also confirmed by LONG MD, JOSHUA [phone]), editor [place] CT, Leda Gauze [phone])  on 06/01/2016 3:18:23 PM       ____________________________________________  RADIOLOGY  CT angio of the head and neck reviewed. No acute findings.  ____________________________________________   PROCEDURES  Procedure(s) performed:   Procedures  None ____________________________________________   INITIAL IMPRESSION / ASSESSMENT AND PLAN / ED COURSE  Pertinent labs  & imaging results that were available during my care of the patient were reviewed by me and considered in my medical decision making (see chart for details).  Patient resents to the emergency department for evaluation of intermittent lightheadedness, vision changes left eye with some associated twitching. Patient had a TIA in 2015 presented in a similar way and let her getting an endarterectomy on the right. Symptoms are now on the left. No other neurological deficits. Her vision has returned to normal and she is no longer having twitching. Plan for CT of the head and neck with contrast and will discuss the case with neurology.   06:57 PM CT angio negative for CVA or significant stenosis. Patient is scheduled to see her PCP tomorrow and vascular surgery team later this week. Will discuss symptoms and CT scans with those providers. Patient cannot get MRI 2/2 pacemaker. No acute distress. No return of symptoms since arrival in the ED. Plan for discharge. Discussed findings and plan in detail with patient and family.   At this time, I do not feel there is any life-threatening condition present. I  have reviewed and discussed all results (EKG, imaging, lab, urine as appropriate), exam findings with patient. I have reviewed nursing notes and appropriate previous records.  I feel the patient is safe to be discharged home without further emergent workup. Discussed usual and customary return precautions. Patient and family (if present) verbalize understanding and are comfortable with this plan.  Patient will follow-up with their primary care provider. If they do not have a primary care provider, information for follow-up has been provided to them. All questions have been answered.  ____________________________________________  FINAL CLINICAL IMPRESSION(S) / ED DIAGNOSES  Final diagnoses:  Episodic lightheadedness  Left eye symptoms     MEDICATIONS GIVEN DURING THIS VISIT:  Medications  iopamidol  (ISOVUE-370) 76 % injection (50 mLs  Contrast Given 06/01/16 1738)     NEW OUTPATIENT MEDICATIONS STARTED DURING THIS VISIT:  None   Note:  This document was prepared using Dragon voice recognition software and may include unintentional dictation errors.  Nanda Quinton, MD Emergency Medicine   Margette Fast, MD 06/03/16 [phone]

## 2016-06-01 NOTE — Discharge Instructions (Signed)
You were seen in the ED today with lightheadedness and eye twitching. We looked for evidence of stroke but did not find any evidence of this. You will need to see your PCP and Vascular Surgeon as scheduled this week.   Return to the Emergency Department with any chest pain, difficulty breathing, weakness, numbness, or additional eye symptoms.

## 2016-06-01 NOTE — ED Notes (Signed)
Pt. Alert and oriented, No arm drift, symmetrical smile, pt. Able to lift her eyebrows withou hesitation.

## 2016-06-02 ENCOUNTER — Ambulatory Visit (INDEPENDENT_AMBULATORY_CARE_PROVIDER_SITE_OTHER): Payer: MEDICARE | Admitting: Internal Medicine

## 2016-06-02 ENCOUNTER — Other Ambulatory Visit (INDEPENDENT_AMBULATORY_CARE_PROVIDER_SITE_OTHER): Payer: MEDICARE

## 2016-06-02 ENCOUNTER — Encounter: Payer: Self-pay | Admitting: Internal Medicine

## 2016-06-02 VITALS — BP 110/56 | HR 70 | Temp 97.8°F | Resp 16 | Ht 65.0 in | Wt 147.2 lb

## 2016-06-02 DIAGNOSIS — N390 Urinary tract infection, site not specified: Secondary | ICD-10-CM

## 2016-06-02 DIAGNOSIS — I63319 Cerebral infarction due to thrombosis of unspecified middle cerebral artery: Secondary | ICD-10-CM

## 2016-06-02 DIAGNOSIS — Z48812 Encounter for surgical aftercare following surgery on the circulatory system: Secondary | ICD-10-CM | POA: Diagnosis not present

## 2016-06-02 DIAGNOSIS — I10 Essential (primary) hypertension: Secondary | ICD-10-CM | POA: Diagnosis not present

## 2016-06-02 DIAGNOSIS — Z9889 Other specified postprocedural states: Secondary | ICD-10-CM

## 2016-06-02 DIAGNOSIS — R059 Cough, unspecified: Secondary | ICD-10-CM

## 2016-06-02 DIAGNOSIS — E034 Atrophy of thyroid (acquired): Secondary | ICD-10-CM

## 2016-06-02 DIAGNOSIS — R05 Cough: Secondary | ICD-10-CM | POA: Diagnosis not present

## 2016-06-02 DIAGNOSIS — R918 Other nonspecific abnormal finding of lung field: Secondary | ICD-10-CM

## 2016-06-02 DIAGNOSIS — E119 Type 2 diabetes mellitus without complications: Secondary | ICD-10-CM

## 2016-06-02 DIAGNOSIS — I6521 Occlusion and stenosis of right carotid artery: Secondary | ICD-10-CM

## 2016-06-02 LAB — URINALYSIS, ROUTINE W REFLEX MICROSCOPIC
BILIRUBIN URINE: NEGATIVE
NITRITE: POSITIVE — AB
SPECIFIC GRAVITY, URINE: 1.01 (ref 1.000–1.030)
Total Protein, Urine: NEGATIVE
UROBILINOGEN UA: 0.2 (ref 0.0–1.0)
Urine Glucose: NEGATIVE
pH: 6 (ref 5.0–8.0)

## 2016-06-02 NOTE — Assessment & Plan Note (Signed)
?  UTI UA and Cx

## 2016-06-02 NOTE — Assessment & Plan Note (Signed)
CBG 110-140

## 2016-06-02 NOTE — Patient Instructions (Signed)
For low BP: reduce Losartan HCT to 1/2 tab a day or stop it if you are still dizzy

## 2016-06-02 NOTE — Progress Notes (Signed)
Subjective:  Patient ID: Kayla Davis, female    DOB: 1922/06/29  Age: 83 y.o. MRN: [id number]  CC: Follow-up (ED f/u lightheadness, eye twitching syncope, thyroid, weakness, blood in urine)   HPI DEVETTA KENFIELD presents for dyslipidemia, hypothyroidism, neuropathy f/u. C/o urinary spotting, incontinence  Outpatient Medications Prior to Visit  Medication Sig Dispense Refill  . ALPHA LIPOIC ACID PO Take 1 tablet by mouth daily.    [name] aspirin EC 81 MG tablet Take 81 mg by mouth daily.    [name] CRANBERRY PO Take 1 tablet by mouth daily with supper.    . Cyanocobalamin (VITAMIN B-12 PO) Take 1 tablet by mouth at bedtime.    [name] Calcium (STOOL SOFTENER PO) Take 2 capsules by mouth at bedtime.    [name] EVENING PRIMROSE OIL PO Take 1 capsule by mouth daily.    . fluticasone (FLONASE ALLERGY RELIEF) 50 MCG/ACT nasal spray Place 1 spray into both nostrils daily.     [name] ipratropium (ATROVENT) 0.06 % nasal spray Place 2 sprays into both nostrils 3 (three) times daily. 45 mL 1  . Lancets (ONETOUCH ULTRASOFT) lancets Use to check blood sugars once a day Dx E11.9 100 each 3  . levothyroxine (SYNTHROID, LEVOTHROID) 125 MCG tablet TAKE 1 TABLET DAILY BEFORE BREAKFAST 90 tablet 3  . loratadine (CLARITIN) 10 MG tablet Take 10 mg by mouth daily.     [name] losartan-hydrochlorothiazide (HYZAAR) 50-12.5 MG tablet Take 0.5 tablets by mouth daily. 45 tablet 3  . meclizine (ANTIVERT) 12.5 MG tablet Take 1 tablet (12.5 mg total) by mouth 3 (three) times daily as needed for dizziness. (Patient not taking: Reported on 02/29/2016) 30 tablet 0  . Multiple Vitamin (MULTIVITAMIN WITH MINERALS) TABS tablet Take 1 tablet by mouth daily with supper.    . Multiple Vitamins-Minerals (EYE VITAMINS) CAPS Take 1 capsule by mouth daily.    . ONE TOUCH ULTRA TEST test strip TEST ONCE DAILY AS DIRECTED 50 each 5  . polyethylene glycol powder (GLYCOLAX/MIRALAX) powder Take 17 g by mouth daily. (Patient taking differently: Take 17  g by mouth at bedtime. Mix in 8 oz liquid and drink) 500 g 11  . Polyvinyl Alcohol-Povidone (REFRESH OP) Place 1 drop into both eyes 2 (two) times daily.    . pregabalin (LYRICA) 75 MG capsule Take 1 capsule (75 mg total) by mouth 2 (two) times daily. (Patient taking differently: Take 75 mg by mouth daily with supper. ) 180 capsule 3  . simvastatin (ZOCOR) 40 MG tablet Take 1 tablet (40 mg total) by mouth daily with supper. 90 tablet 1   No facility-administered medications prior to visit.     ROS Review of Systems  Constitutional: Negative for activity change, appetite change, chills, fatigue and unexpected weight change.  HENT: Negative for congestion, mouth sores and sinus pressure.   Eyes: Negative for visual disturbance.  Respiratory: Negative for cough and chest tightness.   Gastrointestinal: Negative for abdominal pain and nausea.  Genitourinary: Negative for difficulty urinating, frequency and vaginal pain.  Musculoskeletal: Positive for arthralgias and back pain. Negative for gait problem.  Skin: Negative for pallor and rash.  Neurological: Positive for weakness and light-headedness. Negative for dizziness, tremors, numbness and headaches.  Psychiatric/Behavioral: Negative for confusion and sleep disturbance. The patient is nervous/anxious.     Objective:  BP (!) 110/56   Pulse 70   Temp 97.8 F (36.6 C) (Oral)   Resp 16   Ht 5\' 5"  (1.651 m)  Wt 147 lb 4 oz (66.8 kg)   LMP  (LMP Unknown)   SpO2 97%   BMI 24.50 kg/m   BP Readings from Last 3 Encounters:  06/02/16 (!) 110/56  06/01/16 160/78  02/29/16 125/80    Wt Readings from Last 3 Encounters:  06/02/16 147 lb 4 oz (66.8 kg)  06/01/16 147 lb (66.7 kg)  02/29/16 151 lb (68.5 kg)    Physical Exam  Constitutional: She appears well-developed. No distress.  HENT:  Head: Normocephalic.  Right Ear: External ear normal.  Left Ear: External ear normal.  Nose: Nose normal.  Mouth/Throat: Oropharynx is clear and  moist.  Eyes: Conjunctivae are normal. Pupils are equal, round, and reactive to light. Right eye exhibits no discharge. Left eye exhibits no discharge.  Neck: Normal range of motion. Neck supple. No JVD present. No tracheal deviation present. No thyromegaly present.  Cardiovascular: Normal rate, regular rhythm and normal heart sounds.   Pulmonary/Chest: No stridor. No respiratory distress. She has no wheezes.  Abdominal: Soft. Bowel sounds are normal. She exhibits no distension and no mass. There is no tenderness. There is no rebound and no guarding.  Musculoskeletal: She exhibits no edema or tenderness.  Lymphadenopathy:    She has no cervical adenopathy.  Neurological: She displays normal reflexes. No cranial nerve deficit. She exhibits normal muscle tone. Coordination abnormal.  Skin: No rash noted. No erythema.  Psychiatric: She has a normal mood and affect. Her behavior is normal. Judgment and thought content normal.  walker  Lab Results  Component Value Date   WBC 6.5 06/01/2016   HGB 12.4 06/01/2016   HCT 37.1 06/01/2016   PLT 212 06/01/2016   GLUCOSE 131 (H) 06/01/2016   CHOL 161 01/17/2014   TRIG 56 01/17/2014   HDL 68 01/17/2014   LDLDIRECT 143.0 10/07/2007   LDLCALC 82 01/17/2014   ALT 15 03/16/2015   AST 18 03/16/2015   NA 137 06/01/2016   K 4.7 06/01/2016   CL 100 (L) 06/01/2016   CREATININE 0.85 06/01/2016   BUN 26 (H) 06/01/2016   CO2 30 06/01/2016   TSH 0.20 (L) 02/26/2016   INR 1.06 03/16/2015   HGBA1C 6.3 02/26/2016    Ct Angio Head W Or Wo Contrast  Result Date: 06/01/2016 CLINICAL DATA:  83 year old female with left arm weakness, left eye vision changes, intermittent dizziness. Initial encounter. EXAM: CT ANGIOGRAPHY HEAD AND NECK TECHNIQUE: Multidetector CT imaging of the head and neck was performed using the standard protocol during bolus administration of intravenous contrast. Multiplanar CT image reconstructions and MIPs were obtained to evaluate the  vascular anatomy. Carotid stenosis measurements (when applicable) are obtained utilizing NASCET criteria, using the distal internal carotid diameter as the denominator. CONTRAST:  50 mL Isovue 370 COMPARISON:  CTA head and neck 01/17/2014. FINDINGS: CT HEAD Brain: Cerebral volume is not significantly changed since 2015. No midline shift, mass effect, or evidence of intracranial mass lesion. No ventriculomegaly. No acute intracranial hemorrhage identified. Patchy cerebral white matter hypodensity. Mild heterogeneity in the left basal ganglia appears stable. No cortically based acute infarct identified. No cortical encephalomalacia identified. Calvarium and skull base: No acute osseous abnormality identified. Paranasal sinuses: Visualized paranasal sinuses and mastoids are stable and well pneumatized. Orbits: No acute orbit or scalp soft tissue findings. CTA NECK Skeleton: TMJ degeneration. Degeneration in the upper cervical spine with bulky ligamentous hypertrophy about the odontoid. Subsequent mild chronic cervicomedullary junction stenosis. Lower cervical spine disc and endplate degeneration. Upper thoracic endplate osteophytosis. No  acute osseous abnormality identified. Upper chest: Right side check pacemaker type device. No superior mediastinal lymphadenopathy. Negative lung apices. Other neck: Diminutive thyroid. Negative larynx, pharynx, parapharyngeal spaces, retropharyngeal space, sublingual space, submandibular glands and parotid glands. No cervical lymphadenopathy. Aortic arch: 3 vessel arch configuration. Calcified arch and proximal subclavian artery atherosclerosis appears stable since 2015. Right carotid system: No brachiocephalic stenosis. Tortuous right CCA origin without hemodynamically significant stenosis. Interval postoperative changes to the right carotid bifurcation which is widely patent. Negative cervical right ICA. Left carotid system: MA left CCA calcified plaque without stenosis. Mildly  tortuous proximal left CCA. At the left carotid bifurcation severe bulky calcified plaque persists with proximal left ICA stenosis estimated at 60 % with respect to the distal vessel. Calcified plaque continues into the left ICA bulb. Distal to the bulb the vessel is mildly tortuous and otherwise negative to the skullbase. Vertebral arteries: Proximal right subclavian artery calcified plaque with suspicion of hemodynamically significant stenosis is seen on series 503, image 116. Calcified plaque proximal to the right vertebral artery origin which appears mildly stenotic. Negative cervical right vertebral artery otherwise. Confluent calcified plaque in the proximal left subclavian artery with 50 % stenosis with respect to the distal vessel. Calcified plaque at the left vertebral artery origin with mild stenosis. Mildly dominant left vertebral artery is otherwise negative to the skullbase. CTA HEAD Posterior circulation: Fairly codominant distal vertebral arteries with mild V4 segment calcified plaque. Patent PICA origins. Mild left V4 segment stenosis just proximal to the left PICA. No other distal vertebral stenosis. Patent vertebrobasilar junction. Mild basilar calcified plaque without stenosis. Patent SCA and PCA origins without stenosis. Posterior communicating arteries are diminutive or absent. Bilateral PCA branches are within normal limits. Anterior circulation: Both ICA siphons remain patent. Chronic tortuosity of the cavernous segments. Chronic siphon calcified plaque appears stable. There is no significant right siphon stenosis, but there is moderate left supraclinoid segment stenosis which may be increased (series 504 image 124 and series 502, image 236). Normal ophthalmic artery origins. Stable small infundibulum at the right ICA terminus. Patent carotid termini. Normal MCA and ACA origins. Bilateral ACA branches are within normal limits. Left MCA M1 segment, bifurcation, and left MCA branches are within  normal limits. Right MCA M1 segment is mildly irregular. Right MCA bifurcation and right MCA branches are stable and within normal limits. Venous sinuses: Appear patent. Anatomic variants: None. Delayed phase: No abnormal enhancement identified. Review of the MIP images confirms the above findings IMPRESSION: 1. Negative for emergent large vessel occlusion. No significant circle of Willis branch stenosis. 2. Right carotid endarterectomy since 2015 with no right carotid stenosis. Stable since 2015 left ICA origin and bulb stenosis estimated at 60% due to bulky calcified plaque. 3. Chronic ICA siphon calcified plaque. Up to moderate left ICA supraclinoid segment stenosis may have progressed since 2015. 4. Right subclavian artery origin stenosis due to calcified plaque appears high-grade and progressed since 2015, however, there is otherwise only mild bilateral vertebral artery origin and right V4 segment stenosis. 5. Stable CT appearance of the brain since 2015. No acute intracranial abnormality. 6. No acute findings in the neck. Bulky chronic degenerative ligamentous hypertrophy about the odontoid process with mild chronic cervicomedullary junction stenosis. Electronically Signed   By: Genevie Ann M.D.   On: 06/01/2016 18:22   Ct Angio Neck W And/or Wo Contrast  Result Date: 06/01/2016 CLINICAL DATA:  83 year old female with left arm weakness, left eye vision changes, intermittent dizziness. Initial encounter. EXAM: CT  ANGIOGRAPHY HEAD AND NECK TECHNIQUE: Multidetector CT imaging of the head and neck was performed using the standard protocol during bolus administration of intravenous contrast. Multiplanar CT image reconstructions and MIPs were obtained to evaluate the vascular anatomy. Carotid stenosis measurements (when applicable) are obtained utilizing NASCET criteria, using the distal internal carotid diameter as the denominator. CONTRAST:  50 mL Isovue 370 COMPARISON:  CTA head and neck 01/17/2014. FINDINGS: CT  HEAD Brain: Cerebral volume is not significantly changed since 2015. No midline shift, mass effect, or evidence of intracranial mass lesion. No ventriculomegaly. No acute intracranial hemorrhage identified. Patchy cerebral white matter hypodensity. Mild heterogeneity in the left basal ganglia appears stable. No cortically based acute infarct identified. No cortical encephalomalacia identified. Calvarium and skull base: No acute osseous abnormality identified. Paranasal sinuses: Visualized paranasal sinuses and mastoids are stable and well pneumatized. Orbits: No acute orbit or scalp soft tissue findings. CTA NECK Skeleton: TMJ degeneration. Degeneration in the upper cervical spine with bulky ligamentous hypertrophy about the odontoid. Subsequent mild chronic cervicomedullary junction stenosis. Lower cervical spine disc and endplate degeneration. Upper thoracic endplate osteophytosis. No acute osseous abnormality identified. Upper chest: Right side check pacemaker type device. No superior mediastinal lymphadenopathy. Negative lung apices. Other neck: Diminutive thyroid. Negative larynx, pharynx, parapharyngeal spaces, retropharyngeal space, sublingual space, submandibular glands and parotid glands. No cervical lymphadenopathy. Aortic arch: 3 vessel arch configuration. Calcified arch and proximal subclavian artery atherosclerosis appears stable since 2015. Right carotid system: No brachiocephalic stenosis. Tortuous right CCA origin without hemodynamically significant stenosis. Interval postoperative changes to the right carotid bifurcation which is widely patent. Negative cervical right ICA. Left carotid system: MA left CCA calcified plaque without stenosis. Mildly tortuous proximal left CCA. At the left carotid bifurcation severe bulky calcified plaque persists with proximal left ICA stenosis estimated at 60 % with respect to the distal vessel. Calcified plaque continues into the left ICA bulb. Distal to the bulb the  vessel is mildly tortuous and otherwise negative to the skullbase. Vertebral arteries: Proximal right subclavian artery calcified plaque with suspicion of hemodynamically significant stenosis is seen on series 503, image 116. Calcified plaque proximal to the right vertebral artery origin which appears mildly stenotic. Negative cervical right vertebral artery otherwise. Confluent calcified plaque in the proximal left subclavian artery with 50 % stenosis with respect to the distal vessel. Calcified plaque at the left vertebral artery origin with mild stenosis. Mildly dominant left vertebral artery is otherwise negative to the skullbase. CTA HEAD Posterior circulation: Fairly codominant distal vertebral arteries with mild V4 segment calcified plaque. Patent PICA origins. Mild left V4 segment stenosis just proximal to the left PICA. No other distal vertebral stenosis. Patent vertebrobasilar junction. Mild basilar calcified plaque without stenosis. Patent SCA and PCA origins without stenosis. Posterior communicating arteries are diminutive or absent. Bilateral PCA branches are within normal limits. Anterior circulation: Both ICA siphons remain patent. Chronic tortuosity of the cavernous segments. Chronic siphon calcified plaque appears stable. There is no significant right siphon stenosis, but there is moderate left supraclinoid segment stenosis which may be increased (series 504 image 124 and series 502, image 236). Normal ophthalmic artery origins. Stable small infundibulum at the right ICA terminus. Patent carotid termini. Normal MCA and ACA origins. Bilateral ACA branches are within normal limits. Left MCA M1 segment, bifurcation, and left MCA branches are within normal limits. Right MCA M1 segment is mildly irregular. Right MCA bifurcation and right MCA branches are stable and within normal limits. Venous sinuses: Appear patent. Anatomic variants:  None. Delayed phase: No abnormal enhancement identified. Review of  the MIP images confirms the above findings IMPRESSION: 1. Negative for emergent large vessel occlusion. No significant circle of Willis branch stenosis. 2. Right carotid endarterectomy since 2015 with no right carotid stenosis. Stable since 2015 left ICA origin and bulb stenosis estimated at 60% due to bulky calcified plaque. 3. Chronic ICA siphon calcified plaque. Up to moderate left ICA supraclinoid segment stenosis may have progressed since 2015. 4. Right subclavian artery origin stenosis due to calcified plaque appears high-grade and progressed since 2015, however, there is otherwise only mild bilateral vertebral artery origin and right V4 segment stenosis. 5. Stable CT appearance of the brain since 2015. No acute intracranial abnormality. 6. No acute findings in the neck. Bulky chronic degenerative ligamentous hypertrophy about the odontoid process with mild chronic cervicomedullary junction stenosis. Electronically Signed   By: Genevie Ann M.D.   On: 06/01/2016 18:22    Assessment & Plan:   There are no diagnoses linked to this encounter. I am having Ms. Quentin Cornwall maintain her fluticasone, losartan-hydrochlorothiazide, ONE TOUCH ULTRA TEST, onetouch ultrasoft, loratadine, simvastatin, meclizine, ipratropium, polyethylene glycol powder, levothyroxine, pregabalin, aspirin EC, Polyvinyl Alcohol-Povidone (REFRESH OP), ALPHA LIPOIC ACID PO, EVENING PRIMROSE OIL PO, EYE VITAMINS, CRANBERRY PO, Docusate Calcium (STOOL SOFTENER PO), Cyanocobalamin (VITAMIN B-12 PO), and multivitamin with minerals.  No orders of the defined types were placed in this encounter.    Follow-up: No Follow-up on file.  [name], MD

## 2016-06-02 NOTE — Assessment & Plan Note (Signed)
Low BP - reduce Losartan HCT to 1/2 tab a day or stop

## 2016-06-02 NOTE — Assessment & Plan Note (Signed)
On Levothroid 

## 2016-06-02 NOTE — Progress Notes (Signed)
Pre-visit discussion using our clinic review tool. No additional management support is needed unless otherwise documented below in the visit note.  

## 2016-06-02 NOTE — Assessment & Plan Note (Signed)
No relapse Due to low BP - reduce Losartan HCT to 1/2 tab a day or stop Cont ASA, Simvastatin

## 2016-06-04 ENCOUNTER — Telehealth: Payer: Self-pay | Admitting: Internal Medicine

## 2016-06-04 LAB — URINE CULTURE

## 2016-06-04 NOTE — Telephone Encounter (Signed)
Routing to dr plotnikov, please advise, thanks 

## 2016-06-04 NOTE — Telephone Encounter (Signed)
Pt called stated Dr. Camila Li took her off of Losartan last ov due to low BP. Pt call in today stating her BP has been high since she stop taking this med, 168/80. She was wondering instead of taking 1/2 tab daily to take 1/4 tab daily? Please call her back, if this is okey please call this med in to [facility] on [address].

## 2016-06-04 NOTE — Telephone Encounter (Signed)
Re-start 1/2 tab daily pls Thx

## 2016-06-05 ENCOUNTER — Encounter: Payer: Self-pay | Admitting: Family

## 2016-06-05 ENCOUNTER — Ambulatory Visit (HOSPITAL_COMMUNITY)
Admission: RE | Admit: 2016-06-05 | Discharge: 2016-06-05 | Disposition: A | Discharge status: Home / Self Care | Payer: MEDICARE | Source: Ambulatory Visit | Attending: Family | Admitting: Family

## 2016-06-05 ENCOUNTER — Ambulatory Visit (INDEPENDENT_AMBULATORY_CARE_PROVIDER_SITE_OTHER): Payer: MEDICARE | Admitting: Family

## 2016-06-05 VITALS — BP 126/71 | HR 73 | Temp 98.5°F | Resp 16 | Ht 65.0 in | Wt 148.0 lb

## 2016-06-05 DIAGNOSIS — Z9889 Other specified postprocedural states: Secondary | ICD-10-CM | POA: Diagnosis not present

## 2016-06-05 DIAGNOSIS — R42 Dizziness and giddiness: Secondary | ICD-10-CM | POA: Diagnosis not present

## 2016-06-05 DIAGNOSIS — I6521 Occlusion and stenosis of right carotid artery: Secondary | ICD-10-CM | POA: Diagnosis not present

## 2016-06-05 DIAGNOSIS — I6522 Occlusion and stenosis of left carotid artery: Secondary | ICD-10-CM | POA: Diagnosis not present

## 2016-06-05 NOTE — Patient Instructions (Signed)
Stroke Prevention Some medical conditions and behaviors are associated with an increased chance of having a stroke. You may prevent a stroke by making healthy choices and managing medical conditions. How can I reduce my risk of having a stroke?  Stay physically active. Get at least 30 minutes of activity on most or all days.  Do not smoke. It may also be helpful to avoid exposure to secondhand smoke.  Limit alcohol use. Moderate alcohol use is considered to be:  No more than 2 drinks per day for men.  No more than 1 drink per day for nonpregnant women.  Eat healthy foods. This involves:  Eating 5 or more servings of fruits and vegetables a day.  Making dietary changes that address high blood pressure (hypertension), high cholesterol, diabetes, or obesity.  Manage your cholesterol levels.  Making food choices that are high in fiber and low in saturated fat, trans fat, and cholesterol may control cholesterol levels.  Take any prescribed medicines to control cholesterol as directed by your health care provider.  Manage your diabetes.  Controlling your carbohydrate and sugar intake is recommended to manage diabetes.  Take any prescribed medicines to control diabetes as directed by your health care provider.  Control your hypertension.  Making food choices that are low in salt (sodium), saturated fat, trans fat, and cholesterol is recommended to manage hypertension.  Ask your health care provider if you need treatment to lower your blood pressure. Take any prescribed medicines to control hypertension as directed by your health care provider.  If you are 18-39 years of age, have your blood pressure checked every 3-5 years. If you are 40 years of age or older, have your blood pressure checked every year.  Maintain a healthy weight.  Reducing calorie intake and making food choices that are low in sodium, saturated fat, trans fat, and cholesterol are recommended to manage  weight.  Stop drug abuse.  Avoid taking birth control pills.  Talk to your health care provider about the risks of taking birth control pills if you are over 35 years old, smoke, get migraines, or have ever had a blood clot.  Get evaluated for sleep disorders (sleep apnea).  Talk to your health care provider about getting a sleep evaluation if you snore a lot or have excessive sleepiness.  Take medicines only as directed by your health care provider.  For some people, aspirin or blood thinners (anticoagulants) are helpful in reducing the risk of forming abnormal blood clots that can lead to stroke. If you have the irregular heart rhythm of atrial fibrillation, you should be on a blood thinner unless there is a good reason you cannot take them.  Understand all your medicine instructions.  Make sure that other conditions (such as anemia or atherosclerosis) are addressed. Get help right away if:  You have sudden weakness or numbness of the face, arm, or leg, especially on one side of the body.  Your face or eyelid droops to one side.  You have sudden confusion.  You have trouble speaking (aphasia) or understanding.  You have sudden trouble seeing in one or both eyes.  You have sudden trouble walking.  You have dizziness.  You have a loss of balance or coordination.  You have a sudden, severe headache with no known cause.  You have new chest pain or an irregular heartbeat. Any of these symptoms may represent a serious problem that is an emergency. Do not wait to see if the symptoms will go away.   Get medical help at once. Call your local emergency services (911 in [place] Do not drive yourself to the hospital. This information is not intended to replace advice given to you by your health care provider. Make sure you discuss any questions you have with your health care provider. Document Released: 05/08/2004 Document Revised: 09/06/2015 Document Reviewed: 10/01/2012 [employer]vier  Interactive Patient Education  2017 [employer]  

## 2016-06-05 NOTE — Progress Notes (Signed)
Chief Complaint: Follow up Extracranial Carotid Artery Stenosis   History of Present Illness  Kayla Davis is a 83 y.o. female patient of Dr. Kellie Simmering who is status post right carotid endarterectomy on 01/20/2014; she had episodes of amaurosis fugax in her right eye. Her right eye symptoms have completely resolved since her surgery. She takes one aspirin per day. She denies any episodes of lateralizing weakness, aphasia, amaurosis fugax, diplopia, blurred vision, or syncope.  She denies any subsequent TIA's. She has no history of stroke.  She had a left total hip replacement on 03/23/15.  She has a history of recurrent UTI's. She saw her PCP recently for red blood spotting with urination, states no UTI was found.  She was evaluated at [facility] ED on 06-01-16 for dizziness and left eyelid tremor. CT angio was negative for CVA or significant stenosis.  Pt Diabetic: yes, 6.3 A1C on 02-26-16 (review of records) Pt smoker: non-smoker  Pt meds include: Statin : yes ASA: yes Other anticoagulants/antiplatelets: no   Past Medical History:  Diagnosis Date  . Anemia, unspecified   . Anxiety state, unspecified   . Aortic valve disorders   . Backache, unspecified   . Bradycardia    s/p PPM  . Bursitis   . Complication of anesthesia    has had difficulty awakening in past; pt states did well with the anesthesia given during her carotid surgery   . Diabetes mellitus without complication ([place])   . Diverticulosis of colon (without mention of hemorrhage)   . Heart murmur   . Hemorrhoids   . Herpes zoster with other nervous system complications(053.19)   . History of frequent urinary tract infections   . History of measles as a child   . History of mumps as a child   . History of shingles   . Hyperlipidemia   . Hypertension   . Irritable bowel syndrome   . Menopause   . Osteoarthrosis, unspecified whether generalized or localized, unspecified site   . Osteoporosis, unspecified   .  Other abnormal glucose   . Pain in limb   . Peripheral vascular disease ([place])   . Presence of permanent cardiac pacemaker   . TIA (transient ischemic attack)   . Unspecified chronic bronchitis   . Unspecified gastritis and gastroduodenitis without mention of hemorrhage   . Unspecified hearing loss   . Unspecified hereditary and idiopathic peripheral neuropathy   . Unspecified hypothyroidism   . Urinary incontinence   . Varicose veins     Social History Social History  Substance Use Topics  . Smoking status: Never Smoker  . Smokeless tobacco: Never Used  . Alcohol use No    Family History Family History  Problem Relation Age of Onset  . Heart disease Mother   . Heart disease Father   . Diabetes Father   . Cancer Paternal Grandmother     Surgical History Past Surgical History:  Procedure Laterality Date  . APPENDECTOMY    . CATARACT EXTRACTION, BILATERAL  end of June 2013  . CATARACT EXTRACTION, BILATERAL  beginning of July 2013  . CHOLECYSTECTOMY    . DILATION AND CURETTAGE OF UTERUS    . ENDARTERECTOMY Right 01/20/2014   Procedure: RIGHT CAROTID ARTERY ENDARTERECTOMY WITH DACRON PATCH ANGIOPLASTY;  Surgeon: Mal Misty, MD;  Location: [facility];  Service: Vascular;  Laterality: Right;  . PACEMAKER INSERTION     MDT  . TONSILLECTOMY    . TOTAL HIP ARTHROPLASTY Left 03/23/2015   Procedure:  TOTAL HIP ARTHROPLASTY ANTERIOR APPROACH;  Surgeon: Gaynelle Arabian, MD;  Location: [facility];  Service: Orthopedics;  Laterality: Left;  [name] VAGINAL HYSTERECTOMY      Allergies  Allergen Reactions  . Penicillins     REACTION: swells up all over body, pt states she almost died Has patient had a PCN reaction causing immediate rash, facial/tongue/throat swelling, SOB or lightheadedness with hypotension: Yes Has patient had a PCN reaction causing severe rash involving mucus membranes or skin necrosis: known Has patient had a PCN reaction that required hospitalization Yes Has patient had a  PCN reaction occurring within the last 10 years: No If all of the above answers are "NO", then may proceed with Cephalosporin u  . Alprazolam      REACTION: eyes swell up  . Bactrim [Sulfamethoxazole-Trimethoprim]     Unable to remember the side effects  . Ceclor [Cefaclor]     Unsure of the side effects  . Cephalexin     Unsure of the side effects  . Ciprofloxacin Itching and Swelling    Pt states that she tolerated po cipro well, but not the higher dose given IV.  [name] Clindamycin/Lincomycin     Upset stomach  . Hydrocodone     Unsure of reaction to med  . Noroxin [Norfloxacin]     Unsure of side effects  . Sulfonamide Derivatives     REACTION: itching  . Tetracyclines & Related     Unsure of the reaction to this medication  . Tramadol     Did not like how she felt    Current Outpatient Prescriptions  Medication Sig Dispense Refill  . ALPHA LIPOIC ACID PO Take 1 tablet by mouth daily.    [name] aspirin EC 81 MG tablet Take 81 mg by mouth daily.    [name] CRANBERRY PO Take 1 tablet by mouth daily with supper.    . Cyanocobalamin (VITAMIN B-12 PO) Take 1 tablet by mouth at bedtime.    [name] Calcium (STOOL SOFTENER PO) Take 2 capsules by mouth at bedtime.    [name] EVENING PRIMROSE OIL PO Take 1 capsule by mouth daily.    . fluticasone (FLONASE ALLERGY RELIEF) 50 MCG/ACT nasal spray Place 1 spray into both nostrils daily.     [name] ipratropium (ATROVENT) 0.06 % nasal spray Place 2 sprays into both nostrils 3 (three) times daily. 45 mL 1  . Lancets (ONETOUCH ULTRASOFT) lancets Use to check blood sugars once a day Dx E11.9 100 each 3  . levothyroxine (SYNTHROID, LEVOTHROID) 125 MCG tablet TAKE 1 TABLET DAILY BEFORE BREAKFAST 90 tablet 3  . loratadine (CLARITIN) 10 MG tablet Take 10 mg by mouth daily.     [name] losartan-hydrochlorothiazide (HYZAAR) 50-12.5 MG tablet Take 0.5 tablets by mouth daily. 45 tablet 3  . meclizine (ANTIVERT) 12.5 MG tablet Take 1 tablet (12.5 mg total) by mouth 3 (three)  times daily as needed for dizziness. 30 tablet 0  . Multiple Vitamin (MULTIVITAMIN WITH MINERALS) TABS tablet Take 1 tablet by mouth daily with supper.    . Multiple Vitamins-Minerals (EYE VITAMINS) CAPS Take 1 capsule by mouth daily.    . ONE TOUCH ULTRA TEST test strip TEST ONCE DAILY AS DIRECTED 50 each 5  . polyethylene glycol powder (GLYCOLAX/MIRALAX) powder Take 17 g by mouth daily. (Patient taking differently: Take 17 g by mouth at bedtime. Mix in 8 oz liquid and drink) 500 g 11  . Polyvinyl Alcohol-Povidone (REFRESH OP) Place 1 drop into both eyes 2 (  two) times daily.    . pregabalin (LYRICA) 75 MG capsule Take 1 capsule (75 mg total) by mouth 2 (two) times daily. (Patient taking differently: Take 75 mg by mouth daily with supper. ) 180 capsule 3  . simvastatin (ZOCOR) 40 MG tablet Take 1 tablet (40 mg total) by mouth daily with supper. 90 tablet 1   No current facility-administered medications for this visit.     Review of Systems : See HPI for pertinent positives and negatives.  Physical Examination  Vitals:   06/05/16 1433 06/05/16 1442  BP: 126/70 126/71  Pulse: 73   Resp: 16   Temp: 98.5 F (36.9 C)   TempSrc: Oral   SpO2: 96%   Weight: 148 lb (67.1 kg)   Height: 5\' 5"  (1.651 m)    Body mass index is 24.63 kg/m.  General: WDWN female in NAD GAIT: using walker Eyes: PERRLA Pulmonary: Respirations are non-labored, CTAB, good air movement in all fields.  Cardiac: regular rhythm,  no detected murmur. Pacemaker palpated right upper anterior chest.   VASCULAR EXAM Carotid Bruits Right Left   Negative Negative    Aorta is not palpable. Radial pulses are 2+ palpable and equal.                                                                                                                                          LE Pulses Right Left       POPLITEAL  not palpable   not palpable       POSTERIOR TIBIAL   palpable  not palpable       DORSALIS PEDIS   palpable    Faintly palpable     Gastrointestinal: soft, nontender, BS WNL, no r/g,  no palpable masses.  Musculoskeletal: no muscle atrophy/wasting. M/S 5/5 in upper extremities, 4/5 in lower extremities, extremities without ischemic changes.  Neurologic: A&O X 3; Appropriate Affect, Speech is normal CN 2-12 intact except is hard of hearing, pain and light touch intact in extremities, Motor exam as listed above.     Assessment: Kayla Davis is a 83 y.o. female who is status post right carotid endarterectomy on 01/20/2014; preoperatively she had episodes of amaurosis fugax in her right eye.  She has had no subsequent stroke or TIA.  She was evaluated at [facility] ED on 06-01-16 for dizziness and left eyelid tremor. CT angio was negative for CVA or significant stenosis. Her PCP is adjusting her antihypertensive medication.  Pt sees Dr. [name] for pacemaker follow up. EKG on 06-01-16 demonstrated sinus rhythm with sinus arrhythmia with 1st degree A-V block Left axis deviation Left ventricular hypertrophy with QRS widening and repolarization abnormality Possible Lateral infarct , age undetermined Abnormal ECG No STEMI.   Carotid duplex today offers no etiology for her dizziness, bilateral vertebral artery flow is antegrade (normal).   Her atherosclerotic risk factors include well controlled DM  and advanced age. Fortunately she has never used tobacco.  She takes one aspirin per day and a statin.  DATA  CTA Head/neck (06-01-16): IMPRESSION: 1. Negative for emergent large vessel occlusion. No significant circle of Willis branch stenosis. 2. Right carotid endarterectomy since 2015 with no right carotid stenosis. Stable since 2015 left ICA origin and bulb stenosis estimated at 60% due to bulky calcified plaque. 3. Chronic ICA siphon calcified plaque. Up to moderate left ICA supraclinoid segment stenosis may have progressed since 2015. 4. Right subclavian artery origin stenosis due to calcified  plaque appears high-grade and progressed since 2015, however, there is otherwise only mild bilateral vertebral artery origin and right V4 segment stenosis. 5. Stable CT appearance of the brain since 2015. No acute intracranial abnormality. 6. No acute findings in the neck. Bulky chronic degenerative ligamentous hypertrophy about the odontoid process with mild chronic cervicomedullary junction stenosis.   06-05-16 carotid duplex suggests a patent right endarterectomy site with no evidence for restenosis and less than 40% left internal carotid artery stenosis. Bilateral vertebral artery flow is antegrade.  Bilateral subclavian artery waveforms are normal.  No change since exams of 09/12/2014 and 05-31-15.      Plan:  Follow-up in 1 year with Carotid Duplex scan.   I discussed in depth with the patient the nature of atherosclerosis, and emphasized the importance of maximal medical management including strict control of blood pressure, blood glucose, and lipid levels, obtaining regular exercise, and continued cessation of smoking.  The patient is aware that without maximal medical management the underlying atherosclerotic disease process will progress, limiting the benefit of any interventions. The patient was given information about stroke prevention and what symptoms should prompt the patient to seek immediate medical care. Thank you for allowing [place] to participate in this patient's care.  Clemon Chambers, RN, MSN, FNP-C [facility] [place] [facility] [place] Office: [phone]  Clinic Physician: Donzetta Matters on call  06/05/16 2:45 PM

## 2016-06-09 NOTE — Telephone Encounter (Signed)
Patient advised, patient repeated back for understanding

## 2016-06-10 ENCOUNTER — Telehealth: Payer: Self-pay | Admitting: Cardiology

## 2016-06-10 NOTE — Addendum Note (Signed)
Addended by: Lianne Cure A on: 06/10/2016 01:23 PM   Modules accepted: Orders

## 2016-06-10 NOTE — Telephone Encounter (Signed)
Pt called and stated that she has been having some dizziness. Instructed pt to send a remote transmission. Pt verbalized understanding.

## 2016-06-11 NOTE — Telephone Encounter (Signed)
Returned patient's call after reviewing transmission.  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  Patient reports transient dizziness that occurs independent of activity.  She is unable to recall specific dates/times of recent episodes, and notes that she has had similar symptoms in the past, but she states that symptoms have occurred more frequently x2-3 weeks.  Informed patient that I will review episodes and symptoms with Dr. [name] when he is back in the office on Monday.  Patient is agreeable to this plan, and is aware she will hear back from our office early next week.  Patient verbalizes understanding of instructions to call our office or 911 if symptoms worsen in the interim.  Patient reports that her BP has been poorly controlled at home.  Most recent readings: 167/55, 147/74, 144/72, 144/60.  Patient is taking Hyzaar as instructed.  Encouraged patient to contact PCP's office as he follows her BP.  Patient verbalizes understanding of instructions and denies additional questions or concerns at this time.

## 2016-06-16 NOTE — Telephone Encounter (Signed)
Briefly reviewed episodes/symptoms with Chanetta Marshall, NP.  She recommends f/u appointment this week.  Patient is agreeable to appointment on Wednesday, 06/18/16 at 12:40pm with Chanetta Marshall, NP.  She is appreciative of call and denies additional questions or concerns at this time.

## 2016-06-18 ENCOUNTER — Encounter (INDEPENDENT_AMBULATORY_CARE_PROVIDER_SITE_OTHER): Payer: Self-pay

## 2016-06-18 ENCOUNTER — Encounter: Payer: MEDICARE | Admitting: Nurse Practitioner

## 2016-06-18 ENCOUNTER — Encounter: Payer: Self-pay | Admitting: Nurse Practitioner

## 2016-06-18 ENCOUNTER — Telehealth: Payer: Self-pay | Admitting: Nurse Practitioner

## 2016-06-18 ENCOUNTER — Ambulatory Visit (INDEPENDENT_AMBULATORY_CARE_PROVIDER_SITE_OTHER): Payer: MEDICARE | Admitting: Internal Medicine

## 2016-06-18 VITALS — BP 128/78 | HR 95 | Ht 65.0 in | Wt 149.5 lb

## 2016-06-18 DIAGNOSIS — I10 Essential (primary) hypertension: Secondary | ICD-10-CM

## 2016-06-18 DIAGNOSIS — I471 Supraventricular tachycardia: Secondary | ICD-10-CM | POA: Diagnosis not present

## 2016-06-18 DIAGNOSIS — I6521 Occlusion and stenosis of right carotid artery: Secondary | ICD-10-CM

## 2016-06-18 DIAGNOSIS — I495 Sick sinus syndrome: Secondary | ICD-10-CM | POA: Diagnosis not present

## 2016-06-18 MED ORDER — METOPROLOL TARTRATE 25 MG PO TABS: 25.0000 mg | ORAL_TABLET | Freq: Two times a day (BID) | ORAL | 3 refills | Status: DC

## 2016-06-18 MED ORDER — METOPROLOL TARTRATE 25 MG PO TABS: 25.0000 mg | ORAL_TABLET | Freq: Two times a day (BID) | ORAL | 6 refills | Status: DC

## 2016-06-18 NOTE — Telephone Encounter (Signed)
I spoke with Kayla Davis at [facility] [address] and told him Hyzaar was stopped at today's office visit and metoprolol tartrate 25 mg by mouth twice daily was started.  Prescription for metoprolol was [facility].  Will send to [facility] on [address].

## 2016-06-18 NOTE — Progress Notes (Signed)
Electrophysiology Office Note Date: 06/18/2016  ID:  Kayla KOLOSKI, DOB 08-08-1922, MRN [id number]  PCP: [name], MD Electrophysiologist: [name]   CC: Follow up on dizziness/arrhythmias seen on remote interrogation  Kayla Davis is a 83 y.o. female seen today for Dr [name].  She presents today for routine electrophysiology followup.  Since last being seen in our clinic, the patient reports doing reasonably well.  She has been having intermittent dizzy spells as well as episodes of likely AVNRT on device interrogation.  Her spells last for up to a minute at a time and are improved with rest.  She does not ever feel palpitations.  She denies chest pain, palpitations, dyspnea, PND, orthopnea, nausea, vomiting, syncope, edema, weight gain, or early satiety.  Device History: MDT dual chamber PPM implanted 2011 for sinus node dysfunction    Past Medical History:  Diagnosis Date  . Anemia, unspecified   . Anxiety state, unspecified   . Aortic valve disorders   . Bradycardia    s/p PPM  . Bursitis   . Complication of anesthesia    has had difficulty awakening in past; pt states did well with the anesthesia given during her carotid surgery   . Diabetes mellitus without complication ([place])   . Diverticulosis of colon (without mention of hemorrhage)   . Herpes zoster with other nervous system complications(053.19)   . Hyperlipidemia   . Hypertension   . Hypothyroidism   . Irritable bowel syndrome   . Menopause   . Osteoarthrosis, unspecified whether generalized or localized, unspecified site   . Peripheral vascular disease ([place])   . TIA (transient ischemic attack)   . Unspecified hearing loss   . Varicose veins    Past Surgical History:  Procedure Laterality Date  . APPENDECTOMY    . CATARACT EXTRACTION, BILATERAL  end of June 2013  . CHOLECYSTECTOMY    . DILATION AND CURETTAGE OF UTERUS    . ENDARTERECTOMY Right 01/20/2014   Procedure: RIGHT CAROTID ARTERY  ENDARTERECTOMY WITH DACRON PATCH ANGIOPLASTY;  Surgeon: Mal Misty, MD;  Location: [facility];  Service: Vascular;  Laterality: Right;  . PACEMAKER INSERTION     MDT dual chamber PPM implanted by Dr [name] for sinus node dysfunction   . TONSILLECTOMY    . TOTAL HIP ARTHROPLASTY Left 03/23/2015   Procedure: TOTAL HIP ARTHROPLASTY ANTERIOR APPROACH;  Surgeon: Gaynelle Arabian, MD;  [facility];  Service: Orthopedics;  Laterality: Left;  [name] VAGINAL HYSTERECTOMY      Current Outpatient Prescriptions  Medication Sig Dispense Refill  . ALPHA LIPOIC ACID PO Take 1 tablet by mouth daily.    [name] aspirin EC 81 MG tablet Take 81 mg by mouth daily.    [name] CRANBERRY PO Take 1 tablet by mouth daily with supper.    . Cyanocobalamin (VITAMIN B-12 PO) Take 1 tablet by mouth at bedtime.    [name] Calcium (STOOL SOFTENER PO) Take 2 capsules by mouth at bedtime.    [name] EVENING PRIMROSE OIL PO Take 1 capsule by mouth daily.    . fluticasone (FLONASE ALLERGY RELIEF) 50 MCG/ACT nasal spray Place 1 spray into both nostrils daily.     [name] ipratropium (ATROVENT) 0.06 % nasal spray Place 2 sprays into both nostrils 3 (three) times daily. 45 mL 1  . Lancets (ONETOUCH ULTRASOFT) lancets Use to check blood sugars once a day Dx E11.9 100 each 3  . levothyroxine (SYNTHROID, LEVOTHROID) 125 MCG tablet TAKE 1 TABLET DAILY BEFORE  BREAKFAST 90 tablet 3  . loratadine (CLARITIN) 10 MG tablet Take 10 mg by mouth daily.     . meclizine (ANTIVERT) 12.5 MG tablet Take 1 tablet (12.5 mg total) by mouth 3 (three) times daily as needed for dizziness. 30 tablet 0  . Multiple Vitamin (MULTIVITAMIN WITH MINERALS) TABS tablet Take 1 tablet by mouth daily with supper.    . Multiple Vitamins-Minerals (EYE VITAMINS) CAPS Take 1 capsule by mouth daily.    . ONE TOUCH ULTRA TEST test strip TEST ONCE DAILY AS DIRECTED 50 each 5  . Polyvinyl Alcohol-Povidone (REFRESH OP) Place 1 drop into both eyes 2 (two) times daily.    . simvastatin (ZOCOR) 40  MG tablet Take 1 tablet (40 mg total) by mouth daily with supper. 90 tablet 1  . metoprolol tartrate (LOPRESSOR) 25 MG tablet Take 1 tablet (25 mg total) by mouth 2 (two) times daily. 60 tablet 6   No current facility-administered medications for this visit.     Allergies:   Penicillins; Alprazolam; Bactrim [sulfamethoxazole-trimethoprim]; Ceclor [cefaclor]; Cephalexin; Ciprofloxacin; Clindamycin/lincomycin; Hydrocodone; Noroxin [norfloxacin]; Sulfonamide derivatives; Tetracyclines & related; and Tramadol   Social History: Social History   Social History  . Marital status: Widowed    Spouse name: Gypsy Balsam.  (deceased)  . Number of children: 4  . Years of education: COLLEGE   Occupational History  . Retired   .  Retired   Social History Main Topics  . Smoking status: Never Smoker  . Smokeless tobacco: Never Used  . Alcohol use No  . Drug use: No  . Sexual activity: Not on file   Other Topics Concern  . Not on file   Social History Narrative   Patient is widowed with 4 children.   Patient is right handed.   Patient has college education.   Patient drinks 2 cups daily.    Family History: Family History  Problem Relation Age of Onset  . Heart disease Mother   . Heart disease Father   . Diabetes Father   . Cancer Paternal Grandmother      Review of Systems: All other systems reviewed and are otherwise negative except as noted above.   Physical Exam: VS:  BP 128/78   Pulse 95   Ht 5\' 5"  (1.651 m)   Wt 149 lb 8 oz (67.8 kg)   LMP  (LMP Unknown)   SpO2 99%   BMI 24.88 kg/m  , BMI Body mass index is 24.88 kg/m.  GEN- The patient is elderly appearing, alert and oriented x 3 today.   HEENT: normocephalic, atraumatic; sclera clear, conjunctiva pink; hearing intact; oropharynx clear; neck supple  Lungs- Clear to ausculation bilaterally, normal work of breathing.  No wheezes, rales, rhonchi Heart- Regular rate and rhythm  GI- soft, non-tender, non-distended, bowel  sounds present  Extremities- no clubbing, cyanosis, or edema  MS- no significant deformity or atrophy Skin- warm and dry, no rash or lesion; PPM pocket well healed Psych- euthymic mood, full affect Neuro- strength and sensation are intact  PPM Interrogation- reviewed in detail today,  See PACEART report  EKG:  EKG is not ordered today.  Recent Labs: 08/20/2015: Pro B Natriuretic peptide (BNP) 391.20 02/26/2016: TSH 0.20 06/01/2016: BUN 26; Creatinine, Ser 0.85; Hemoglobin 12.4; Platelets 212; Potassium 4.7; Sodium 137   Wt Readings from Last 3 Encounters:  06/18/16 149 lb 8 oz (67.8 kg)  06/05/16 148 lb (67.1 kg)  06/02/16 147 lb 4 oz (66.8 kg)  Other studies Reviewed: Additional studies/ records that were reviewed today include: Dr Jackalyn Lombard office notes  Assessment and Plan:  1.  Sinus node dysfunction Normal PPM function See Pace Art report No changes today  2.  Likely AVNRT Seen on device interrogation and could be related to dizziness she has been having Stop Hyzaar Start Metoprolol 25mg  twice daily Follow up with EP NP in 4 weeks   3.  HTN Stable No change required today  Current medicines are reviewed at length with the patient today.   The patient does not have concerns regarding her medicines.  The following changes were made today:  Stop Hyzaar, start Metoprolol 25mg  twice daily   Disposition:   Follow up with EP NP in 4 weeks     Army Fossa MD 06/18/2016 3:43 PM  [facility] [address] [place] [address] [phone] (office) [phone] (fax

## 2016-06-18 NOTE — Patient Instructions (Addendum)
Medication Instructions:   STOP TAKING HYZARR   START TAKING METOPROLOL TARTRATE 25 MG TWICE A DAY    If you need a refill on your cardiac medications before your next appointment, please call your pharmacy.  Labwork: NONE ORDERED  TODAY    Testing/Procedures: NONE ORDERED  TODAY    Follow-Up: IN 4 WEEKS WITH  AMBER SEILER   Any Other Special Instructions Will Be Listed Below (If Applicable).

## 2016-06-18 NOTE — Telephone Encounter (Signed)
New message    Clair Gulling is calling because pt said that medication for blood pressure Clair Gulling   do not know the name of the medication and pt is in store waiting    was suppose to be called in today following ov with [employer]

## 2016-06-19 ENCOUNTER — Other Ambulatory Visit: Payer: Self-pay | Admitting: Nurse Practitioner

## 2016-06-26 ENCOUNTER — Encounter: Payer: Self-pay | Admitting: Internal Medicine

## 2016-07-01 ENCOUNTER — Telehealth: Payer: Self-pay | Admitting: Internal Medicine

## 2016-07-01 NOTE — Telephone Encounter (Signed)
[facility]  Patient Name: Kayla Davis  DOB: 12-26-1922    Initial Comment caller says she got really dizzy in the past hour, and her BP is 183/88    Nurse Assessment  Nurse: Harlow Mares, RN, Suanne Marker Date/Time (Eastern Time): 07/01/2016 4:53:36 PM  Confirm and document reason for call. If symptomatic, describe symptoms. ---caller says she got really dizzy in the past hour, and her BP is 183/88, reports her bp is usually low. Reports that her BP meds were changed 2 weeks ago at the [facility], and she had been feeling fine until today.  Does the patient have any new or worsening symptoms? ---Yes  Will a triage be completed? ---Yes  Related visit to physician within the last 2 weeks? ---No  Does the PT have any chronic conditions? (i.e. diabetes, asthma, etc.) ---Yes  List chronic conditions. ---HTN; pacemaker;  Is this a behavioral health or substance abuse call? ---No     Guidelines    Guideline Title Affirmed Question Affirmed Notes  High Blood Pressure BP ? 180/110    Final Disposition User   See Physician within 24 Hours [place], RN, Suanne Marker    Comments  Appt scheduled with Wilfred Lacy, FNP for tomorrow at 1pm at the [facility] office. Caller reports that her son will drive her to this appt.   Referrals  REFERRED TO PCP OFFICE   Disagree/Comply: Comply

## 2016-07-02 ENCOUNTER — Ambulatory Visit (INDEPENDENT_AMBULATORY_CARE_PROVIDER_SITE_OTHER): Payer: MEDICARE | Admitting: Nurse Practitioner

## 2016-07-02 ENCOUNTER — Encounter: Payer: Self-pay | Admitting: Nurse Practitioner

## 2016-07-02 VITALS — BP 122/58 | HR 59 | Temp 97.4°F | Ht 65.0 in | Wt 148.0 lb

## 2016-07-02 DIAGNOSIS — I10 Essential (primary) hypertension: Secondary | ICD-10-CM

## 2016-07-02 MED ORDER — LOSARTAN POTASSIUM 25 MG PO TABS: 25.0000 mg | ORAL_TABLET | Freq: Two times a day (BID) | ORAL | 3 refills | Status: DC

## 2016-07-02 NOTE — Patient Instructions (Signed)
Check BP twice a day and record for next 2days, then check once a day (morning) continuously and record Bring BP readings to next office visit.  Hold losartan if BP <100/58.

## 2016-07-02 NOTE — Progress Notes (Signed)
Subjective:  Patient ID: Kayla Davis, female    DOB: March 30, 1923  Age: 83 y.o. MRN: [id number]  CC: Hypertension (high BP yesterday 183/100 after took med 150/77. dizzy. )   Hypertension  This is a chronic problem. The current episode started more than 1 year ago. The problem has been waxing and waning since onset. The problem is uncontrolled. Associated symptoms include headaches. Pertinent negatives include no anxiety, blurred vision, chest pain, malaise/fatigue, neck pain, orthopnea, palpitations, peripheral edema, PND, shortness of breath or sweats. Agents associated with hypertension include thyroid hormones. Risk factors for coronary artery disease include post-menopausal state and sedentary lifestyle. Past treatments include beta blockers. The current treatment provides moderate improvement. There are no compliance problems.  Identifiable causes of hypertension include a thyroid problem.  elevated BP reading are noted late afternoon.  Losartan HCT was discontinue by cardiologist.  No facility-administered medications prior to visit.    Outpatient Medications Prior to Visit  Medication Sig Dispense Refill  . ALPHA LIPOIC ACID PO Take 1 tablet by mouth daily.    [name] aspirin EC 81 MG tablet Take 81 mg by mouth daily.    [name] CRANBERRY PO Take 1 tablet by mouth daily with supper.    . Cyanocobalamin (VITAMIN B-12 PO) Take 1 tablet by mouth at bedtime.    [name] Calcium (STOOL SOFTENER PO) Take 2 capsules by mouth at bedtime.    [name] EVENING PRIMROSE OIL PO Take 1 capsule by mouth daily.    . fluticasone (FLONASE ALLERGY RELIEF) 50 MCG/ACT nasal spray Place 1 spray into both nostrils daily.     [name] ipratropium (ATROVENT) 0.06 % nasal spray Place 2 sprays into both nostrils 3 (three) times daily. 45 mL 1  . Lancets (ONETOUCH ULTRASOFT) lancets Use to check blood sugars once a day Dx E11.9 100 each 3  . levothyroxine (SYNTHROID, LEVOTHROID) 125 MCG tablet TAKE 1 TABLET DAILY BEFORE  BREAKFAST 90 tablet 3  . loratadine (CLARITIN) 10 MG tablet Take 10 mg by mouth daily.     . meclizine (ANTIVERT) 12.5 MG tablet Take 1 tablet (12.5 mg total) by mouth 3 (three) times daily as needed for dizziness. 30 tablet 0  . metoprolol tartrate (LOPRESSOR) 25 MG tablet Take 1 tablet (25 mg total) by mouth 2 (two) times daily. 60 tablet 6  . Multiple Vitamin (MULTIVITAMIN WITH MINERALS) TABS tablet Take 1 tablet by mouth daily with supper.    . Multiple Vitamins-Minerals (EYE VITAMINS) CAPS Take 1 capsule by mouth daily.    . ONE TOUCH ULTRA TEST test strip TEST ONCE DAILY AS DIRECTED 50 each 5  . Polyvinyl Alcohol-Povidone (REFRESH OP) Place 1 drop into both eyes 2 (two) times daily.    . simvastatin (ZOCOR) 40 MG tablet Take 1 tablet (40 mg total) by mouth daily with supper. 90 tablet 1    ROS See HPI  Objective:  BP (!) 122/58   Pulse (!) 59   Temp 97.4 F (36.3 C) (Oral)   Ht 5\' 5"  (1.651 m)   Wt 148 lb (67.1 kg)   LMP  (LMP Unknown)   SpO2 97%   BMI 24.63 kg/m   BP Readings from Last 3 Encounters:  07/04/16 (!) 121/55  07/02/16 (!) 122/58  06/18/16 128/78    Wt Readings from Last 3 Encounters:  07/03/16 148 lb (67.1 kg)  07/02/16 148 lb (67.1 kg)  06/18/16 149 lb 8 oz (67.8 kg)    Physical Exam  Constitutional: She is  oriented to person, place, and time. No distress.  Cardiovascular: Normal rate and normal heart sounds.   Pulmonary/Chest: Effort normal and breath sounds normal.  Musculoskeletal: She exhibits no edema.  Neurological: She is alert and oriented to person, place, and time.  Vitals reviewed.   Lab Results  Component Value Date   WBC 6.7 07/03/2016   HGB 11.2 (L) 07/03/2016   HCT 33.0 (L) 07/03/2016   PLT 205 07/03/2016   GLUCOSE 191 (H) 07/03/2016   CHOL 145 07/04/2016   TRIG 71 07/04/2016   HDL 54 07/04/2016   LDLDIRECT 143.0 10/07/2007   LDLCALC 77 07/04/2016   ALT 24 07/03/2016   AST 24 07/03/2016   NA 136 07/03/2016   K 4.3  07/03/2016   CL 101 07/03/2016   CREATININE 0.90 07/03/2016   BUN 29 (H) 07/03/2016   CO2 26 07/03/2016   TSH 0.195 (L) 07/04/2016   INR 1.06 07/03/2016   HGBA1C 6.3 02/26/2016    No results found.  Assessment & Plan:   Kayla Davis was seen today for hypertension.  Diagnoses and all orders for this visit:  Essential hypertension -     losartan (COZAAR) 25 MG tablet; Take 1 tablet (25 mg total) by mouth 2 (two) times daily. (Patient taking differently: Take 25 mg by mouth daily as needed. )   I have discontinued Kayla Davis's losartan-hydrochlorothiazide. I am also having her start on losartan. Additionally, I am having her maintain her fluticasone, ONE TOUCH ULTRA TEST, onetouch ultrasoft, loratadine, simvastatin, meclizine, ipratropium, levothyroxine, aspirin EC, Polyvinyl Alcohol-Povidone (REFRESH OP), ALPHA LIPOIC ACID PO, EVENING PRIMROSE OIL PO, EYE VITAMINS, CRANBERRY PO, Docusate Calcium (STOOL SOFTENER PO), Cyanocobalamin (VITAMIN B-12 PO), multivitamin with minerals, metoprolol tartrate, and LYRICA.  Meds ordered this encounter  Medications  . LYRICA 75 MG capsule    Sig: Take 75 mg by mouth 2 (two) times daily.  [name] DISCONTD: losartan-hydrochlorothiazide (HYZAAR) 50-12.5 MG tablet  . losartan (COZAAR) 25 MG tablet    Sig: Take 1 tablet (25 mg total) by mouth 2 (two) times daily.    Dispense:  60 tablet    Refill:  3    Order Specific Question:   Supervising Provider    Answer:   [name] [[id number]]    Follow-up: Return in about 1 week (around 07/09/2016) for HTN.  Wilfred Lacy, NP

## 2016-07-02 NOTE — Progress Notes (Signed)
Pre visit review using our clinic review tool, if applicable. No additional management support is needed unless otherwise documented below in the visit note. 

## 2016-07-03 ENCOUNTER — Telehealth: Payer: Self-pay | Admitting: Internal Medicine

## 2016-07-03 ENCOUNTER — Encounter (HOSPITAL_COMMUNITY): Payer: Self-pay

## 2016-07-03 ENCOUNTER — Observation Stay (HOSPITAL_COMMUNITY)
Admission: EM | Admit: 2016-07-03 | Discharge: 2016-07-04 | Disposition: A | Discharge status: Home / Self Care | Payer: MEDICARE | Attending: Internal Medicine | Admitting: Internal Medicine

## 2016-07-03 ENCOUNTER — Observation Stay (HOSPITAL_COMMUNITY): Payer: MEDICARE

## 2016-07-03 ENCOUNTER — Emergency Department (HOSPITAL_COMMUNITY): Payer: MEDICARE

## 2016-07-03 DIAGNOSIS — E039 Hypothyroidism, unspecified: Secondary | ICD-10-CM | POA: Insufficient documentation

## 2016-07-03 DIAGNOSIS — Z950 Presence of cardiac pacemaker: Secondary | ICD-10-CM | POA: Diagnosis not present

## 2016-07-03 DIAGNOSIS — Z880 Allergy status to penicillin: Secondary | ICD-10-CM | POA: Diagnosis not present

## 2016-07-03 DIAGNOSIS — I10 Essential (primary) hypertension: Secondary | ICD-10-CM

## 2016-07-03 DIAGNOSIS — Z881 Allergy status to other antibiotic agents status: Secondary | ICD-10-CM | POA: Insufficient documentation

## 2016-07-03 DIAGNOSIS — Z8673 Personal history of transient ischemic attack (TIA), and cerebral infarction without residual deficits: Secondary | ICD-10-CM | POA: Diagnosis not present

## 2016-07-03 DIAGNOSIS — G609 Hereditary and idiopathic neuropathy, unspecified: Secondary | ICD-10-CM | POA: Insufficient documentation

## 2016-07-03 DIAGNOSIS — I6522 Occlusion and stenosis of left carotid artery: Secondary | ICD-10-CM | POA: Diagnosis not present

## 2016-07-03 DIAGNOSIS — Z7982 Long term (current) use of aspirin: Secondary | ICD-10-CM | POA: Diagnosis not present

## 2016-07-03 DIAGNOSIS — G453 Amaurosis fugax: Secondary | ICD-10-CM | POA: Diagnosis not present

## 2016-07-03 DIAGNOSIS — H543 Unqualified visual loss, both eyes: Secondary | ICD-10-CM

## 2016-07-03 DIAGNOSIS — H547 Unspecified visual loss: Secondary | ICD-10-CM

## 2016-07-03 DIAGNOSIS — H538 Other visual disturbances: Secondary | ICD-10-CM | POA: Diagnosis not present

## 2016-07-03 DIAGNOSIS — E784 Other hyperlipidemia: Secondary | ICD-10-CM

## 2016-07-03 DIAGNOSIS — Z96642 Presence of left artificial hip joint: Secondary | ICD-10-CM | POA: Insufficient documentation

## 2016-07-03 DIAGNOSIS — Z882 Allergy status to sulfonamides status: Secondary | ICD-10-CM | POA: Diagnosis not present

## 2016-07-03 DIAGNOSIS — E1151 Type 2 diabetes mellitus with diabetic peripheral angiopathy without gangrene: Secondary | ICD-10-CM | POA: Insufficient documentation

## 2016-07-03 DIAGNOSIS — E785 Hyperlipidemia, unspecified: Secondary | ICD-10-CM | POA: Insufficient documentation

## 2016-07-03 DIAGNOSIS — G459 Transient cerebral ischemic attack, unspecified: Secondary | ICD-10-CM

## 2016-07-03 DIAGNOSIS — I6529 Occlusion and stenosis of unspecified carotid artery: Secondary | ICD-10-CM | POA: Diagnosis present

## 2016-07-03 DIAGNOSIS — Z79899 Other long term (current) drug therapy: Secondary | ICD-10-CM | POA: Diagnosis not present

## 2016-07-03 DIAGNOSIS — H53122 Transient visual loss, left eye: Secondary | ICD-10-CM | POA: Diagnosis present

## 2016-07-03 DIAGNOSIS — G458 Other transient cerebral ischemic attacks and related syndromes: Secondary | ICD-10-CM

## 2016-07-03 DIAGNOSIS — E119 Type 2 diabetes mellitus without complications: Secondary | ICD-10-CM

## 2016-07-03 LAB — I-STAT CHEM 8, ED
BUN: 29 mg/dL — ABNORMAL HIGH (ref 6–20)
CALCIUM ION: 1.19 mmol/L (ref 1.15–1.40)
CHLORIDE: 101 mmol/L (ref 101–111)
CREATININE: 0.9 mg/dL (ref 0.44–1.00)
GLUCOSE: 191 mg/dL — AB (ref 65–99)
HCT: 33 % — ABNORMAL LOW (ref 36.0–46.0)
Hemoglobin: 11.2 g/dL — ABNORMAL LOW (ref 12.0–15.0)
Potassium: 4.3 mmol/L (ref 3.5–5.1)
Sodium: 136 mmol/L (ref 135–145)
TCO2: 26 mmol/L (ref 0–100)

## 2016-07-03 LAB — CBC
HEMATOCRIT: 34.8 % — AB (ref 36.0–46.0)
HEMOGLOBIN: 11.2 g/dL — AB (ref 12.0–15.0)
MCH: 30.4 pg (ref 26.0–34.0)
MCHC: 32.2 g/dL (ref 30.0–36.0)
MCV: 94.3 fL (ref 78.0–100.0)
Platelets: 205 10*3/uL (ref 150–400)
RBC: 3.69 MIL/uL — AB (ref 3.87–5.11)
RDW: 13.6 % (ref 11.5–15.5)
WBC: 6.7 10*3/uL (ref 4.0–10.5)

## 2016-07-03 LAB — APTT: aPTT: 28 seconds (ref 24–36)

## 2016-07-03 LAB — PROTIME-INR
INR: 1.06
Prothrombin Time: 13.9 seconds (ref 11.4–15.2)

## 2016-07-03 LAB — DIFFERENTIAL
Basophils Absolute: 0.1 10*3/uL (ref 0.0–0.1)
Basophils Relative: 1 %
Eosinophils Absolute: 0.4 10*3/uL (ref 0.0–0.7)
Eosinophils Relative: 6 %
LYMPHS ABS: 2 10*3/uL (ref 0.7–4.0)
Lymphocytes Relative: 29 %
MONO ABS: 0.5 10*3/uL (ref 0.1–1.0)
Monocytes Relative: 7 %
Neutro Abs: 3.8 10*3/uL (ref 1.7–7.7)
Neutrophils Relative %: 57 %

## 2016-07-03 LAB — COMPREHENSIVE METABOLIC PANEL
ALK PHOS: 76 U/L (ref 38–126)
ALT: 24 U/L (ref 14–54)
ANION GAP: 7 (ref 5–15)
AST: 24 U/L (ref 15–41)
Albumin: 3.8 g/dL (ref 3.5–5.0)
BILIRUBIN TOTAL: 0.5 mg/dL (ref 0.3–1.2)
BUN: 27 mg/dL — ABNORMAL HIGH (ref 6–20)
CO2: 26 mmol/L (ref 22–32)
CREATININE: 0.97 mg/dL (ref 0.44–1.00)
Calcium: 9.1 mg/dL (ref 8.9–10.3)
Chloride: 102 mmol/L (ref 101–111)
GFR calc non Af Amer: 50 mL/min — ABNORMAL LOW (ref >60)
GFR, EST AFRICAN AMERICAN: 57 mL/min — AB (ref >60)
GLUCOSE: 194 mg/dL — AB (ref 65–99)
Potassium: 4.8 mmol/L (ref 3.5–5.1)
Sodium: 135 mmol/L (ref 135–145)
TOTAL PROTEIN: 6.1 g/dL — AB (ref 6.5–8.1)

## 2016-07-03 LAB — I-STAT TROPONIN, ED: Troponin i, poc: 0 ng/mL (ref 0.00–0.08)

## 2016-07-03 LAB — GLUCOSE, CAPILLARY: Glucose-Capillary: 88 mg/dL (ref 65–99)

## 2016-07-03 LAB — CBG MONITORING, ED: Glucose-Capillary: 173 mg/dL — ABNORMAL HIGH (ref 65–99)

## 2016-07-03 MED ORDER — IPRATROPIUM BROMIDE 0.06 % NA SOLN
2.0000 | Freq: Three times a day (TID) | NASAL | Status: DC
  Administered 2016-07-03: 2 via NASAL
  Filled 2016-07-03: qty 15

## 2016-07-03 MED ORDER — HYDRALAZINE HCL 20 MG/ML IJ SOLN: 10.0000 mg | Freq: Four times a day (QID) | INTRAMUSCULAR | Status: DC | PRN

## 2016-07-03 MED ORDER — INSULIN ASPART 100 UNIT/ML ~~LOC~~ SOLN
0.0000 [IU] | Freq: Three times a day (TID) | SUBCUTANEOUS | Status: DC
  Administered 2016-07-04: 1 [IU] via SUBCUTANEOUS

## 2016-07-03 MED ORDER — CLOPIDOGREL BISULFATE 75 MG PO TABS
75.0000 mg | ORAL_TABLET | Freq: Every day | ORAL | Status: DC
  Administered 2016-07-03 – 2016-07-04 (×2): 75 mg via ORAL
  Filled 2016-07-03 (×2): qty 1

## 2016-07-03 MED ORDER — FLUTICASONE PROPIONATE 50 MCG/ACT NA SUSP
1.0000 | Freq: Every day | NASAL | Status: DC
  Administered 2016-07-04: 1 via NASAL
  Filled 2016-07-03: qty 16

## 2016-07-03 MED ORDER — ACETAMINOPHEN 650 MG RE SUPP: 650.0000 mg | RECTAL | Status: DC | PRN

## 2016-07-03 MED ORDER — SENNOSIDES-DOCUSATE SODIUM 8.6-50 MG PO TABS: 1.0000 | ORAL_TABLET | Freq: Every evening | ORAL | Status: DC | PRN

## 2016-07-03 MED ORDER — PREGABALIN 75 MG PO CAPS
75.0000 mg | ORAL_CAPSULE | Freq: Two times a day (BID) | ORAL | Status: DC
  Administered 2016-07-03 – 2016-07-04 (×2): 75 mg via ORAL
  Filled 2016-07-03 (×2): qty 1

## 2016-07-03 MED ORDER — ENOXAPARIN SODIUM 40 MG/0.4ML ~~LOC~~ SOLN
40.0000 mg | SUBCUTANEOUS | Status: DC
  Administered 2016-07-04: 40 mg via SUBCUTANEOUS
  Filled 2016-07-03: qty 0.4

## 2016-07-03 MED ORDER — SODIUM CHLORIDE 0.9 % IV SOLN: INTRAVENOUS | Status: DC

## 2016-07-03 MED ORDER — STROKE: EARLY STAGES OF RECOVERY BOOK
Freq: Once | Status: AC
  Administered 2016-07-03: 22:00:00
  Filled 2016-07-03 (×2): qty 1

## 2016-07-03 MED ORDER — MECLIZINE HCL 25 MG PO TABS: 12.5000 mg | ORAL_TABLET | Freq: Three times a day (TID) | ORAL | Status: DC | PRN

## 2016-07-03 MED ORDER — ADULT MULTIVITAMIN W/MINERALS CH
1.0000 | ORAL_TABLET | Freq: Every day | ORAL | Status: DC
  Administered 2016-07-04: 1 via ORAL
  Filled 2016-07-03: qty 1

## 2016-07-03 MED ORDER — POLYVINYL ALCOHOL 1.4 % OP SOLN
1.0000 [drp] | Freq: Two times a day (BID) | OPHTHALMIC | Status: DC
  Administered 2016-07-03 – 2016-07-04 (×2): 1 [drp] via OPHTHALMIC
  Filled 2016-07-03: qty 15

## 2016-07-03 MED ORDER — SIMVASTATIN 40 MG PO TABS
40.0000 mg | ORAL_TABLET | Freq: Every day | ORAL | Status: DC
  Administered 2016-07-04: 40 mg via ORAL
  Filled 2016-07-03: qty 1

## 2016-07-03 MED ORDER — ACETAMINOPHEN 160 MG/5ML PO SOLN: 650.0000 mg | ORAL | Status: DC | PRN

## 2016-07-03 MED ORDER — LEVOTHYROXINE SODIUM 25 MCG PO TABS
125.0000 ug | ORAL_TABLET | Freq: Every day | ORAL | Status: DC
  Administered 2016-07-04: 125 ug via ORAL
  Filled 2016-07-03: qty 1

## 2016-07-03 MED ORDER — ACETAMINOPHEN 325 MG PO TABS
650.0000 mg | ORAL_TABLET | ORAL | Status: DC | PRN
  Administered 2016-07-04: 650 mg via ORAL
  Filled 2016-07-03: qty 2

## 2016-07-03 MED ORDER — IOPAMIDOL (ISOVUE-370) INJECTION 76%
INTRAVENOUS | Status: AC
  Administered 2016-07-03: 50 mL
  Filled 2016-07-03: qty 50

## 2016-07-03 MED ORDER — LORATADINE 10 MG PO TABS
10.0000 mg | ORAL_TABLET | Freq: Every day | ORAL | Status: DC
  Administered 2016-07-04: 10 mg via ORAL
  Filled 2016-07-03: qty 1

## 2016-07-03 NOTE — ED Notes (Signed)
Patient transported to CT 

## 2016-07-03 NOTE — ED Notes (Signed)
ED Provider at bedside. 

## 2016-07-03 NOTE — ED Notes (Signed)
Pt failed swallow screen test, started to cough after drinking from a cup.  SLP ordered

## 2016-07-03 NOTE — ED Notes (Signed)
Hospitalist MD, Tat at bedside.

## 2016-07-03 NOTE — H&P (Signed)
History and Physical  TYLEIGH MAHN TKW:[id number] DOB: [birth year] DOA: 07/03/2016   PCP: [name], MD   Patient coming from: Home  Chief Complaint: left eye flashing lights and blurry vision  HPI:  NEVADA KIRCHNER is a 83 y.o. female with medical history of TIA carotid stenosis status post right CEA on 01/20/2014, hypertension, hyperlipidemia, diabetes mellitus, hypothyroidism presents with visual disturbance of the left eye manifested by diplopia, flashing lights, and blurry vision that began around 1:30 PM on 07/03/2016. The patient stated that her symptoms lasted approximately 20-30 minutes and were completely resolved by the time she arrived to the emergency department. The patient denies any focal unilateral weakness. She denies any dysarthria or word finding difficulties. She states that she had a similar episode on 06/01/2016 which lasted a shorter period of time. CT angiogram of the neck at that time showed stable left carotid stenosis of 60% when compared to 2015. The patient has been compliant with her aspirin. The patient denies any fevers, chills, headache, neck pain, dysuria, hematuria, dizziness. There is no chest pain, suspect, nausea, vomiting, diarrhea, abdominal pain. Notably, the patient was recently started on losartan 25 mg twice a day on the day prior to this admission.  In the emergency department, the patient was afebrile and hemodynamically stable and saturating well on room air. BMP and CBC were essentially unremarkable. Hepatic enzymes were negative. CT of the brain was negative for acute findings. EKG was AV paced with unchanged LBBB. Neurology was consulted by EDP.      Assessment/Plan: TIA -Appreciate neurology consultation  -Appreciate Neurology Consult -PT/OT evaluation -Speech therapy eval -CT brain--neg for acute findings -cannot obtain MRI due to PPM -06/01/16 CTA neck--unchanged left carotid stenosis 60%; right subclavian stenosis progressed  since 2015; no other acute findings in the neck -Repeat CT angiogram neck ordered by ED -Echo-- -LDL-- -HbA1C-- -Antiplatelet--plavix 75 mg daily  Hypertension -allow permissive hypertension -hold losartan and metoprolol -hydralazine prn SBP > 220 or SBP >120  Hyperlipidemia -Continue statin  Diabetes mellitus type 2 -Hemoglobin A1c -NovoLog sliding scale  Symptomatically bradycardia -Status post permanent pacemaker  Hypothyroidism -Continue Synthroid -Check TSH         Past Medical History:  Diagnosis Date  . Anemia, unspecified   . Anxiety state, unspecified   . Aortic valve disorders   . Bradycardia    s/p PPM  . Bursitis   . Complication of anesthesia    has had difficulty awakening in past; pt states did well with the anesthesia given during her carotid surgery   . Diabetes mellitus without complication ([place])   . Diverticulosis of colon (without mention of hemorrhage)   . Herpes zoster with other nervous system complications(053.19)   . Hyperlipidemia   . Hypertension   . Hypothyroidism   . Irritable bowel syndrome   . Menopause   . Osteoarthrosis, unspecified whether generalized or localized, unspecified site   . Peripheral vascular disease ([place])   . TIA (transient ischemic attack)   . Unspecified hearing loss   . Varicose veins    Past Surgical History:  Procedure Laterality Date  . APPENDECTOMY    . CATARACT EXTRACTION, BILATERAL  end of June 2013  . CHOLECYSTECTOMY    . DILATION AND CURETTAGE OF UTERUS    . ENDARTERECTOMY Right 01/20/2014   Procedure: RIGHT CAROTID ARTERY ENDARTERECTOMY WITH DACRON PATCH ANGIOPLASTY;  Surgeon: Mal Misty, MD;  Location: [facility];  Service: Vascular;  Laterality: Right;  .  PACEMAKER INSERTION     MDT dual chamber PPM implanted by Dr [name] for sinus node dysfunction   . TONSILLECTOMY    . TOTAL HIP ARTHROPLASTY Left 03/23/2015   Procedure: TOTAL HIP ARTHROPLASTY ANTERIOR APPROACH;  Surgeon: Gaynelle Arabian,  MD;  Location: [facility];  Service: Orthopedics;  Laterality: Left;  [name] VAGINAL HYSTERECTOMY     Social History:  reports that she has never smoked. She has never used smokeless tobacco. She reports that she does not drink alcohol or use drugs.   Family History  Problem Relation Age of Onset  . Heart disease Mother   . Heart disease Father   . Diabetes Father   . Cancer Paternal Grandmother      Allergies  Allergen Reactions  . Penicillins     REACTION: swells up all over body, pt states she almost died Has patient had a PCN reaction causing immediate rash, facial/tongue/throat swelling, SOB or lightheadedness with hypotension: Yes Has patient had a PCN reaction causing severe rash involving mucus membranes or skin necrosis: known Has patient had a PCN reaction that required hospitalization Yes Has patient had a PCN reaction occurring within the last 10 years: No If all of the above answers are "NO", then may proceed with Cephalosporin u  . Alprazolam      REACTION: eyes swell up  . Bactrim [Sulfamethoxazole-Trimethoprim]     Unable to remember the side effects  . Ceclor [Cefaclor]     Unsure of the side effects  . Cephalexin     Unsure of the side effects  . Ciprofloxacin Itching and Swelling    Pt states that she tolerated po cipro well, but not the higher dose given IV.  [name] Clindamycin/Lincomycin     Upset stomach  . Hydrocodone     Unsure of reaction to med  . Noroxin [Norfloxacin]     Unsure of side effects  . Sulfonamide Derivatives     REACTION: itching  . Tetracyclines & Related     Unsure of the reaction to this medication  . Tramadol     Did not like how she felt     Prior to Admission medications   Medication Sig Start Date End Date Taking? Authorizing Provider  ALPHA LIPOIC ACID PO Take 1 tablet by mouth daily.   Yes Historical Provider, MD  aspirin EC 81 MG tablet Take 81 mg by mouth daily.   Yes Historical Provider, MD  CRANBERRY PO Take 1 tablet by  mouth daily with supper.   Yes Historical Provider, MD  Cyanocobalamin (VITAMIN B-12 PO) Take 1 tablet by mouth at bedtime.   Yes Historical Provider, MD  Docusate Calcium (STOOL SOFTENER PO) Take 2 capsules by mouth at bedtime.   Yes Historical Provider, MD  EVENING PRIMROSE OIL PO Take 1 capsule by mouth daily.   Yes Historical Provider, MD  fluticasone (FLONASE ALLERGY RELIEF) 50 MCG/ACT nasal spray Place 1 spray into both nostrils daily.    Yes Historical Provider, MD  ipratropium (ATROVENT) 0.06 % nasal spray Place 2 sprays into both nostrils 3 (three) times daily. 02/20/16  Yes Aleksei Plotnikov V, MD  levothyroxine (SYNTHROID, LEVOTHROID) 125 MCG tablet TAKE 1 TABLET DAILY BEFORE BREAKFAST 03/12/16  Yes Aleksei Plotnikov V, MD  loratadine (CLARITIN) 10 MG tablet Take 10 mg by mouth daily.    Yes Historical Provider, MD  losartan (COZAAR) 25 MG tablet Take 1 tablet (25 mg total) by mouth 2 (two) times daily. Patient taking differently: Take 25  mg by mouth daily as needed.  07/02/16  Yes Charlene Brooke Nche, NP  LYRICA 75 MG capsule Take 75 mg by mouth 2 (two) times daily. 04/09/16  Yes Historical Provider, MD  meclizine (ANTIVERT) 12.5 MG tablet Take 1 tablet (12.5 mg total) by mouth 3 (three) times daily as needed for dizziness. 11/27/15  Yes Lucretia Kern, DO  metoprolol tartrate (LOPRESSOR) 25 MG tablet Take 1 tablet (25 mg total) by mouth 2 (two) times daily. 06/18/16 09/16/16 Yes Amber Sena Slate, NP  Multiple Vitamin (MULTIVITAMIN WITH MINERALS) TABS tablet Take 1 tablet by mouth daily with supper.   Yes Historical Provider, MD  Multiple Vitamins-Minerals (EYE VITAMINS) CAPS Take 1 capsule by mouth daily.   Yes Historical Provider, MD  Polyvinyl Alcohol-Povidone (REFRESH OP) Place 1 drop into both eyes 2 (two) times daily.   Yes Historical Provider, MD  simvastatin (ZOCOR) 40 MG tablet Take 1 tablet (40 mg total) by mouth daily with supper. 11/06/15  Yes Aleksei Plotnikov V, MD  Lancets (ONETOUCH  ULTRASOFT) lancets Use to check blood sugars once a day Dx E11.9 10/10/15   [name], MD  ONE TOUCH ULTRA TEST test strip TEST ONCE DAILY AS DIRECTED 10/01/15   Lew Dawes V, MD    Review of Systems:  Constitutional:  No weight loss, night sweats, Fevers, chills, fatigue.  Head&Eyes: No headache.  No vision loss.  No eye pain or scotoma ENT:  No Difficulty swallowing,Tooth/dental problems,Sore throat,  No ear ache, post nasal drip,  Cardio-vascular:  No chest pain, Orthopnea, PND, swelling in lower extremities,  dizziness, palpitations  GI:  No  abdominal pain, nausea, vomiting, diarrhea, loss of appetite, hematochezia, melena, heartburn, indigestion, Resp:  No shortness of breath with exertion or at rest. No cough. No coughing up of blood .No wheezing.No chest wall deformity  Skin:  no rash or lesions.  GU:  no dysuria, change in color of urine, no urgency or frequency. No flank pain.  Musculoskeletal:  No joint pain or swelling. No decreased range of motion. No back pain.  Psych:  No change in mood or affect. No depression or anxiety.  Neurologic: No headache, no dysesthesia, no focal weakness,  No syncope  Physical Exam: Vitals:   07/03/16 1715 07/03/16 1820 07/03/16 1834 07/03/16 2158  BP: (!) 138/51 (!) 153/57  (!) 158/68  Pulse: 60 60  60  Resp: 16 20    Temp:   98.3 F (36.8 C) 97.3 F (36.3 C)  TempSrc:    Oral  SpO2: 98% 97%  99%  Weight:      Height:       General:  A&O x 3, NAD, nontoxic, pleasant/cooperative Head/Eye: No conjunctival hemorrhage, no icterus, [place]/AT, No nystagmus ENT:  No icterus,  No thrush, good dentition, no pharyngeal exudate Neck:  No masses, no lymphadenpathy, no bruits CV:  RRR, no rub, no gallop, no S3 Lung:  CTAB, good air movement, no wheeze, no rhonchi Abdomen: soft/NT, +BS, nondistended, no peritoneal signs Ext: No cyanosis, No rashes, No petechiae, No lymphangitis, No edema Neuro: CNII-XII intact, strength 4/5  in bilateral upper and lower extremities, no dysmetria  Labs on Admission:  Basic Metabolic Panel:  Recent Labs Lab 07/03/16 1549 07/03/16 1618  NA 135 136  K 4.8 4.3  CL 102 101  CO2 26  --   GLUCOSE 194* 191*  BUN 27* 29*  CREATININE 0.97 0.90  CALCIUM 9.1  --    Liver Function Tests:  Recent Labs Lab 07/03/16 1549  AST 24  ALT 24  ALKPHOS 76  BILITOT 0.5  PROT 6.1*  ALBUMIN 3.8   No results for input(s): LIPASE, AMYLASE in the last 168 hours. No results for input(s): AMMONIA in the last 168 hours. CBC:  Recent Labs Lab 07/03/16 1549 07/03/16 1618  WBC 6.7  --   NEUTROABS 3.8  --   HGB 11.2* 11.2*  HCT 34.8* 33.0*  MCV 94.3  --   PLT 205  --    Coagulation Profile:  Recent Labs Lab 07/03/16 1549  INR 1.06   Cardiac Enzymes: No results for input(s): CKTOTAL, CKMB, CKMBINDEX, TROPONINI in the last 168 hours. BNP: Invalid input(s): POCBNP CBG:  Recent Labs Lab 07/03/16 1726 07/03/16 2209  GLUCAP 173* 88   Urine analysis:    Component Value Date/Time   COLORURINE YELLOW 06/02/2016 1452   APPEARANCEUR Sl Cloudy (A) 06/02/2016 1452   LABSPEC 1.010 06/02/2016 1452   PHURINE 6.0 06/02/2016 1452   GLUCOSEU NEGATIVE 06/02/2016 1452   HGBUR TRACE-INTACT (A) 06/02/2016 1452   BILIRUBINUR NEGATIVE 06/02/2016 1452   KETONESUR TRACE (A) 06/02/2016 1452   PROTEINUR NEGATIVE 03/16/2015 0846   UROBILINOGEN 0.2 06/02/2016 1452   NITRITE POSITIVE (A) 06/02/2016 1452   LEUKOCYTESUR SMALL (A) 06/02/2016 1452   Sepsis Labs: @LABRCNTIP (procalcitonin:4,lacticidven:4) )No results found for this or any previous visit (from the past 240 hour(s)).   Radiological Exams on Admission: Ct Angio Head W Or Wo Contrast  Result Date: 07/03/2016 CLINICAL DATA:  Initial evaluation for left-sided visual disturbance. EXAM: CT ANGIOGRAPHY HEAD AND NECK TECHNIQUE: Multidetector CT imaging of the head and neck was performed using the standard protocol during bolus  administration of intravenous contrast. Multiplanar CT image reconstructions and MIPs were obtained to evaluate the vascular anatomy. Carotid stenosis measurements (when applicable) are obtained utilizing NASCET criteria, using the distal internal carotid diameter as the denominator. CONTRAST:  50 cc of Isovue 370. COMPARISON:  Prior CT from earlier same day. FINDINGS: CTA NECK FINDINGS Aortic arch: Aortic arch and origin of the great vessels incompletely evaluated on this exam. Scattered plaque noted within the partially visualized origin about the partially visualized origin of the left subclavian artery without flow-limiting stenosis subclavian arteries distally without significant stenosis. Right carotid system: Right common carotid artery mildly tortuous proximally. Scattered calcified plaque within the right common carotid artery without flow limiting stenosis. Sequela prior endarterectomy present about the right carotid bifurcation. No residual stenosis. Right ICA widely patent distally to the skullbase without stenosis. Left carotid system: Origin of the left common carotid artery not visualized. Mild a centric plaque about the of left common carotid artery without flow-limiting stenosis. Moderate calcified plaque about the left bifurcation/proximal left ICA. Relatively mild narrowing of approximately 25% by NASCET criteria. Left ICA patent distally without stenosis. Vertebral arteries: Both of the vertebral arteries arise from the subclavian arteries. Minimal plaque within the pre foraminal left V1 segment without stenosis. Left V1 segment tortuous. Mild focal stenosis proximal V2 segments bilaterally related to degenerative disc osteophyte at the level of C5-6. Vertebral arteries otherwise widely patent without stenosis or occlusion. Skeleton: No acute osseous abnormality. No worrisome lytic or blastic osseous lesions. Multilevel degenerative spondylolysis noted, greatest at C5-6. Multilevel facet  arthropathy within the upper cervical spine. Other neck: Soft tissues of the neck demonstrate no acute abnormality. No adenopathy. Salivary glands normal. Thyroid normal. Upper chest: Upper mediastinum within normal limits. Visualized lungs are clear. Review of the MIP images confirms the  above findings CTA HEAD FINDINGS Anterior circulation: The petrous segments widely patent bilaterally. Smooth atheromatous plaque throughout the cavernous/supraclinoid ICAs without significant stenosis. ICA termini widely patent. A1 segments patent. Anterior communicating artery normal. Anterior cerebral arteries patent to their distal aspects. M1 segments widely patent without stenosis or occlusion. Distal MCA branches widely patent and symmetric. Posterior circulation: Minimal plaque within the bilateral V4 segments without significant stenosis. Left vertebral artery slightly dominant. Posterior inferior cerebral arteries patent bilaterally. Basilar artery widely patent. Superior cerebral arteries patent bilaterally. Both of the posterior cerebral arteries supplied via the basilar and are widely patent to their distal aspects. Venous sinuses: Patent. Anatomic variants: No aneurysm or vascular malformation. Tiny 2 mm focal outpouching arising from the supraclinoid right ICA favored to reflect a normal vascular infundibulum related to a hypoplastic right P com. No significant anatomic variant. Delayed phase: No pathologic enhancement. Review of the MIP images confirms the above findings IMPRESSION: 1. Negative CTA for emergent large vessel occlusion. 2. Calcified atheromatous plaque at the left carotid bifurcation with mild stenosis of approximately 25% by NASCET criteria. 3. Sequelae of prior right CEA without residual stenosis. 4. Smooth atheromatous plaque throughout the carotid siphons with mild diffuse narrowing. Electronically Signed   By: Jeannine Boga M.D.   On: 07/03/2016 21:01   Ct Head Wo Contrast  Result  Date: 07/03/2016 CLINICAL DATA:  Blurred vision. EXAM: CT HEAD WITHOUT CONTRAST TECHNIQUE: Contiguous axial images were obtained from the base of the skull through the vertex without intravenous contrast. COMPARISON:  CT scan of June 01, 2016. FINDINGS: Brain: Mild chronic ischemic white matter disease is noted. No mass effect or midline shift is noted. Ventricular size is within normal limits. There is no evidence of mass lesion, hemorrhage or acute infarction. Vascular: Atherosclerosis of carotid siphons is noted. Skull: Normal. Negative for fracture or focal lesion. Sinuses/Orbits: No acute finding. Other: None. IMPRESSION: Mild chronic ischemic white matter disease. No acute intracranial abnormality seen. Electronically Signed   By: Marijo Conception, M.D.   On: 07/03/2016 16:36   Ct Angio Neck W And/or Wo Contrast  Result Date: 07/03/2016 CLINICAL DATA:  Initial evaluation for left-sided visual disturbance. EXAM: CT ANGIOGRAPHY HEAD AND NECK TECHNIQUE: Multidetector CT imaging of the head and neck was performed using the standard protocol during bolus administration of intravenous contrast. Multiplanar CT image reconstructions and MIPs were obtained to evaluate the vascular anatomy. Carotid stenosis measurements (when applicable) are obtained utilizing NASCET criteria, using the distal internal carotid diameter as the denominator. CONTRAST:  50 cc of Isovue 370. COMPARISON:  Prior CT from earlier same day. FINDINGS: CTA NECK FINDINGS Aortic arch: Aortic arch and origin of the great vessels incompletely evaluated on this exam. Scattered plaque noted within the partially visualized origin about the partially visualized origin of the left subclavian artery without flow-limiting stenosis subclavian arteries distally without significant stenosis. Right carotid system: Right common carotid artery mildly tortuous proximally. Scattered calcified plaque within the right common carotid artery without flow limiting  stenosis. Sequela prior endarterectomy present about the right carotid bifurcation. No residual stenosis. Right ICA widely patent distally to the skullbase without stenosis. Left carotid system: Origin of the left common carotid artery not visualized. Mild a centric plaque about the of left common carotid artery without flow-limiting stenosis. Moderate calcified plaque about the left bifurcation/proximal left ICA. Relatively mild narrowing of approximately 25% by NASCET criteria. Left ICA patent distally without stenosis. Vertebral arteries: Both of the vertebral arteries arise from the  subclavian arteries. Minimal plaque within the pre foraminal left V1 segment without stenosis. Left V1 segment tortuous. Mild focal stenosis proximal V2 segments bilaterally related to degenerative disc osteophyte at the level of C5-6. Vertebral arteries otherwise widely patent without stenosis or occlusion. Skeleton: No acute osseous abnormality. No worrisome lytic or blastic osseous lesions. Multilevel degenerative spondylolysis noted, greatest at C5-6. Multilevel facet arthropathy within the upper cervical spine. Other neck: Soft tissues of the neck demonstrate no acute abnormality. No adenopathy. Salivary glands normal. Thyroid normal. Upper chest: Upper mediastinum within normal limits. Visualized lungs are clear. Review of the MIP images confirms the above findings CTA HEAD FINDINGS Anterior circulation: The petrous segments widely patent bilaterally. Smooth atheromatous plaque throughout the cavernous/supraclinoid ICAs without significant stenosis. ICA termini widely patent. A1 segments patent. Anterior communicating artery normal. Anterior cerebral arteries patent to their distal aspects. M1 segments widely patent without stenosis or occlusion. Distal MCA branches widely patent and symmetric. Posterior circulation: Minimal plaque within the bilateral V4 segments without significant stenosis. Left vertebral artery slightly  dominant. Posterior inferior cerebral arteries patent bilaterally. Basilar artery widely patent. Superior cerebral arteries patent bilaterally. Both of the posterior cerebral arteries supplied via the basilar and are widely patent to their distal aspects. Venous sinuses: Patent. Anatomic variants: No aneurysm or vascular malformation. Tiny 2 mm focal outpouching arising from the supraclinoid right ICA favored to reflect a normal vascular infundibulum related to a hypoplastic right P com. No significant anatomic variant. Delayed phase: No pathologic enhancement. Review of the MIP images confirms the above findings IMPRESSION: 1. Negative CTA for emergent large vessel occlusion. 2. Calcified atheromatous plaque at the left carotid bifurcation with mild stenosis of approximately 25% by NASCET criteria. 3. Sequelae of prior right CEA without residual stenosis. 4. Smooth atheromatous plaque throughout the carotid siphons with mild diffuse narrowing. Electronically Signed   By: Jeannine Boga M.D.   On: 07/03/2016 21:01    EKG: Independently reviewed. AV paced, LBBB    Time spent:60 minutes Code Status:   FULL Family Communication:  Son and daughter updated at bedside Disposition Plan: expect 1-2 day hospitalization Consults called: Neurology DVT Prophylaxis: Hector Lovenox  Carisma Troupe, DO  [facility] Pager [phone]  If 7PM-7AM, please contact night-coverage [email or web address] Password [facility] 07/03/2016, 10:33 PM

## 2016-07-03 NOTE — Consult Note (Signed)
Admission H&P    Chief Complaint: Blurred vision with diplopia and left-sided scotomas.  HPI: Kayla Davis is an 83 y.o. female history of TIA, carotid artery disease with right CEA, hypertension, hyperlipidemia and diabetes mellitus, as well as bradycardia and pacemaker placement, presenting following an episode of transient visual changes consisting of blurring as well as diplopia, and flashing lights involving left side of vision. She had no focal weakness. Speech did not change. She's been taking aspirin 81 mg per day. CT scan of her head showed no acute abnormality. NIH stroke score at the time of this evaluation was 0.  LSN: 2:40 PM on 07/03/2016 tPA Given: No: Deficits resolved mRankin:  Past Medical History:  Diagnosis Date  . Anemia, unspecified   . Anxiety state, unspecified   . Aortic valve disorders   . Bradycardia    s/p PPM  . Bursitis   . Complication of anesthesia    has had difficulty awakening in past; pt states did well with the anesthesia given during her carotid surgery   . Diabetes mellitus without complication ([place])   . Diverticulosis of colon (without mention of hemorrhage)   . Herpes zoster with other nervous system complications(053.19)   . Hyperlipidemia   . Hypertension   . Hypothyroidism   . Irritable bowel syndrome   . Menopause   . Osteoarthrosis, unspecified whether generalized or localized, unspecified site   . Peripheral vascular disease ([place])   . TIA (transient ischemic attack)   . Unspecified hearing loss   . Varicose veins     Past Surgical History:  Procedure Laterality Date  . APPENDECTOMY    . CATARACT EXTRACTION, BILATERAL  end of June 2013  . CHOLECYSTECTOMY    . DILATION AND CURETTAGE OF UTERUS    . ENDARTERECTOMY Right 01/20/2014   Procedure: RIGHT CAROTID ARTERY ENDARTERECTOMY WITH DACRON PATCH ANGIOPLASTY;  Surgeon: Mal Misty, MD;  Location: [place];  Service: Vascular;  Laterality: Right;  . PACEMAKER INSERTION     MDT  dual chamber PPM implanted by Dr [name] for sinus node dysfunction   . TONSILLECTOMY    . TOTAL HIP ARTHROPLASTY Left 03/23/2015   Procedure: TOTAL HIP ARTHROPLASTY ANTERIOR APPROACH;  Surgeon: Gaynelle Arabian, MD;  Location: [facility];  Service: Orthopedics;  Laterality: Left;  [name] VAGINAL HYSTERECTOMY      Family History  Problem Relation Age of Onset  . Heart disease Mother   . Heart disease Father   . Diabetes Father   . Cancer Paternal Grandmother    Social History:  reports that she has never smoked. She has never used smokeless tobacco. She reports that she does not drink alcohol or use drugs.  Allergies:  Allergies  Allergen Reactions  . Penicillins     REACTION: swells up all over body, pt states she almost died Has patient had a PCN reaction causing immediate rash, facial/tongue/throat swelling, SOB or lightheadedness with hypotension: Yes Has patient had a PCN reaction causing severe rash involving mucus membranes or skin necrosis: known Has patient had a PCN reaction that required hospitalization Yes Has patient had a PCN reaction occurring within the last 10 years: No If all of the above answers are "NO", then may proceed with Cephalosporin u  . Alprazolam      REACTION: eyes swell up  . Bactrim [Sulfamethoxazole-Trimethoprim]     Unable to remember the side effects  . Ceclor [Cefaclor]     Unsure of the side effects  . Cephalexin  Unsure of the side effects  . Ciprofloxacin Itching and Swelling    Pt states that she tolerated po cipro well, but not the higher dose given IV.  [name] Clindamycin/Lincomycin     Upset stomach  . Hydrocodone     Unsure of reaction to med  . Noroxin [Norfloxacin]     Unsure of side effects  . Sulfonamide Derivatives     REACTION: itching  . Tetracyclines & Related     Unsure of the reaction to this medication  . Tramadol     Did not like how she felt    Medications: Preadmission medications were reviewed by me.  ROS: History  obtained from the patient  General ROS: negative for - chills, fatigue, fever, night sweats, weight gain or weight loss Psychological ROS: negative for - behavioral disorder, hallucinations, memory difficulties, mood swings or suicidal ideation Ophthalmic ROS: negative for - blurry vision, double vision, eye pain or loss of vision ENT ROS: negative for - epistaxis, nasal discharge, oral lesions, sore throat, tinnitus or vertigo Allergy and Immunology ROS: negative for - hives or itchy/watery eyes Hematological and Lymphatic ROS: negative for - bleeding problems, bruising or swollen lymph nodes Endocrine ROS: negative for - galactorrhea, hair pattern changes, polydipsia/polyuria or temperature intolerance Respiratory ROS: negative for - cough, hemoptysis, shortness of breath or wheezing Cardiovascular ROS: negative for - chest pain, dyspnea on exertion, edema or irregular heartbeat Gastrointestinal ROS: negative for - abdominal pain, diarrhea, hematemesis, nausea/vomiting or stool incontinence Genito-Urinary ROS: negative for - dysuria, hematuria, incontinence or urinary frequency/urgency Musculoskeletal ROS: negative for - joint swelling or muscular weakness Neurological ROS: as noted in HPI Dermatological ROS: negative for rash and skin lesion changes  Physical Examination: Blood pressure (!) 153/57, pulse 60, temperature 98.3 F (36.8 C), resp. rate 20, height _0  (1.651 m), weight 67.1 kg (148 lb), SpO2 97 %.  HEENT-  Normocephalic, no lesions, without obvious abnormality.  Normal external eye and conjunctiva.  Normal TM's bilaterally.  Normal auditory canals and external ears. Normal external nose, mucus membranes and septum.  Normal pharynx. Neck supple with no masses, nodes, nodules or enlargement. Cardiovascular - regular rate and rhythm, S1, S2 normal, no murmur, click, rub or gallop Lungs - chest clear, no wheezing, rales, normal symmetric air entry Abdomen - soft, non-tender;  bowel sounds normal; no masses,  no organomegaly Extremities - no joint deformities, effusion, or inflammation and no edema  Neurologic Examination: Mental Status: Alert, oriented, thought content appropriate.  Speech fluent without evidence of aphasia. Able to follow commands without difficulty. Cranial Nerves: II-Visual fields were normal. III/IV/VI-Pupils were equal and reacted normally to light. Extraocular movements were full and conjugate.    V/VII-no facial numbness and no facial weakness. VIII-normal. X-normal speech and symmetrical palatal movement. XI: trapezius strength/neck flexion strength normal bilaterally XII-midline tongue extension with normal strength. Motor: 5/5 bilaterally with normal tone and bulk Sensory: Normal throughout. Deep Tendon Reflexes: Trace only and symmetric. Plantars: Flexor bilaterally Cerebellar: Normal finger-to-nose testing. Carotid auscultation: Normal  Results for orders placed or performed during the hospital encounter of 07/03/16 (from the past 48 hour(s))  Protime-INR     Status: None   Collection Time: 07/03/16  3:49 PM  Result Value Ref Range   Prothrombin Time 13.9 11.4 - 15.2 seconds   INR 1.06   APTT     Status: None   Collection Time: 07/03/16  3:49 PM  Result Value Ref Range   aPTT 28 24 - 36  seconds  CBC     Status: Abnormal   Collection Time: 07/03/16  3:49 PM  Result Value Ref Range   WBC 6.7 4.0 - 10.5 K/uL   RBC 3.69 (L) 3.87 - 5.11 MIL/uL   Hemoglobin 11.2 (L) 12.0 - 15.0 g/dL   HCT 34.8 (L) 36.0 - 46.0 %   MCV 94.3 78.0 - 100.0 fL   MCH 30.4 26.0 - 34.0 pg   MCHC 32.2 30.0 - 36.0 g/dL   RDW 13.6 11.5 - 15.5 %   Platelets 205 150 - 400 K/uL  Differential     Status: None   Collection Time: 07/03/16  3:49 PM  Result Value Ref Range   Neutrophils Relative % 57 %   Neutro Abs 3.8 1.7 - 7.7 K/uL   Lymphocytes Relative 29 %   Lymphs Abs 2.0 0.7 - 4.0 K/uL   Monocytes Relative 7 %   Monocytes Absolute 0.5 0.1 - 1.0  K/uL   Eosinophils Relative 6 %   Eosinophils Absolute 0.4 0.0 - 0.7 K/uL   Basophils Relative 1 %   Basophils Absolute 0.1 0.0 - 0.1 K/uL  Comprehensive metabolic panel     Status: Abnormal   Collection Time: 07/03/16  3:49 PM  Result Value Ref Range   Sodium 135 135 - 145 mmol/L   Potassium 4.8 3.5 - 5.1 mmol/L   Chloride 102 101 - 111 mmol/L   CO2 26 22 - 32 mmol/L   Glucose, Bld 194 (H) 65 - 99 mg/dL   BUN 27 (H) 6 - 20 mg/dL   Creatinine, Ser 0.97 0.44 - 1.00 mg/dL   Calcium 9.1 8.9 - 10.3 mg/dL   Total Protein 6.1 (L) 6.5 - 8.1 g/dL   Albumin 3.8 3.5 - 5.0 g/dL   AST 24 15 - 41 U/L   ALT 24 14 - 54 U/L   Alkaline Phosphatase 76 38 - 126 U/L   Total Bilirubin 0.5 0.3 - 1.2 mg/dL   GFR calc non Af Amer 50 (L) >60 mL/min   GFR calc Af Amer 57 (L) >60 mL/min    Comment: (NOTE) The eGFR has been calculated using the CKD EPI equation. This calculation has not been validated in all clinical situations. eGFR's persistently <60 mL/min signify possible Chronic Kidney Disease.    Anion gap 7 5 - 15  I-stat troponin, ED     Status: None   Collection Time: 07/03/16  4:16 PM  Result Value Ref Range   Troponin i, poc 0.00 0.00 - 0.08 ng/mL   Comment 3            Comment: Due to the release kinetics of cTnI, a negative result within the first hours of the onset of symptoms does not rule out myocardial infarction with certainty. If myocardial infarction is still suspected, repeat the test at appropriate intervals.   I-Stat Chem 8, ED     Status: Abnormal   Collection Time: 07/03/16  4:18 PM  Result Value Ref Range   Sodium 136 135 - 145 mmol/L   Potassium 4.3 3.5 - 5.1 mmol/L   Chloride 101 101 - 111 mmol/L   BUN 29 (H) 6 - 20 mg/dL   Creatinine, Ser 0.90 0.44 - 1.00 mg/dL   Glucose, Bld 191 (H) 65 - 99 mg/dL   Calcium, Ion 1.19 1.15 - 1.40 mmol/L   TCO2 26 0 - 100 mmol/L   Hemoglobin 11.2 (L) 12.0 - 15.0 g/dL   HCT 33.0 (L) 36.0 -  46.0 %  CBG monitoring, ED     Status:  Abnormal   Collection Time: 07/03/16  5:26 PM  Result Value Ref Range   Glucose-Capillary 173 (H) 65 - 99 mg/dL   Ct Head Wo Contrast  Result Date: 07/03/2016 CLINICAL DATA:  Blurred vision. EXAM: CT HEAD WITHOUT CONTRAST TECHNIQUE: Contiguous axial images were obtained from the base of the skull through the vertex without intravenous contrast. COMPARISON:  CT scan of June 01, 2016. FINDINGS: Brain: Mild chronic ischemic white matter disease is noted. No mass effect or midline shift is noted. Ventricular size is within normal limits. There is no evidence of mass lesion, hemorrhage or acute infarction. Vascular: Atherosclerosis of carotid siphons is noted. Skull: Normal. Negative for fracture or focal lesion. Sinuses/Orbits: No acute finding. Other: None. IMPRESSION: Mild chronic ischemic white matter disease. No acute intracranial abnormality seen. Electronically Signed   By: Marijo Conception, M.D.   On: 07/03/2016 16:36    Assessment: 83 y.o. female with multiple risk factors for stroke presenting with probable posterior circulation territory TIA.  Stroke Risk Factors - diabetes mellitus, hyperlipidemia and hypertension, carotid artery disease  Plan: 1. HgbA1c, fasting lipid panel 2. CT angiogram of head and neck 3. PT consult, OT consult, Speech consult 4. Echocardiogram 5. Prophylactic therapy-Antiplatelet med: Aspirin 6. Risk factor modification 7. Telemetry monitoring  C.R. Nicole Kindred, MD [facility] [phone]  07/03/2016, 8:00 PM

## 2016-07-03 NOTE — ED Triage Notes (Signed)
Pt reports today around 12 noon she saw bright lights and experienced double vision from her left eye. This lasted about 20 minutes. She states she felt weak all over after this happened and was afraid to walk. Pts son with patient and states pt had no problem with speech and no numbness or tingling. No current vision disturbances. A similar episode occurred about 2 years ago, pt reports she had a blockage in her right carotid artery and had surgery on it. She had it checked 6 months ago and was told no blockages at that time. A&Ox4.

## 2016-07-03 NOTE — Telephone Encounter (Signed)
Patient Name: NIMO VERASTEGUI DOB: July 13, 1922 Initial Comment mother just had double vision and bright lights in left eye, has gone down some now, saw dr yesterday for BP med. Nurse Assessment Nurse: Erling Cruz, RN, [facility] Hummingbird Date/Time (Eastern Time): 07/03/2016 2:43:35 PM Confirm and document reason for call. If symptomatic, describe symptoms. ---Caller states mother just had double vision and bright lights in left eye, BP has gone down some now, saw dr yesterday for BP med. Current BP is 154/67, HR 160 about 7min ago. Sx lasted for 17min but are gone now. Has taken BP meds today. Does the patient have any new or worsening symptoms? ---Yes Will a triage be completed? ---Yes Related visit to physician within the last 2 weeks? ---Yes Does the PT have any chronic conditions? (i.e. diabetes, asthma, etc.) ---Yes List chronic conditions. ---HTN, neuropathy, DM Is this a behavioral health or substance abuse call? ---No Guidelines Guideline Title Affirmed Question Affirmed Notes Vision Loss or Change Flashes of light (Exception: brief from pressing on the eyeball) Final Disposition User See Physician within 4 Hours (or PCP triage) Love, RN, More[facility] Hummingbird Comments No appointments available. Advised caller will need to take her to [facility]. Caller states "[facility] won't take [facility] due to her age" & will take her to ED outcome. Referrals REFERRED TO PCP OFFICE Disagree/Comply: Comply

## 2016-07-03 NOTE — ED Notes (Signed)
Pt ambulated to the BR with a walker without difficulty.  Pt denied any lightheadedness.

## 2016-07-03 NOTE — ED Provider Notes (Signed)
[facility] DEPT Provider Note   CSN: [id number] Arrival date & time: 07/03/16  1531     History   Chief Complaint Chief Complaint  Patient presents with  . Eye Problem    HPI Kayla Davis is a 83 y.o. female.  HPI  83 year old female with history of TIAs on 81 mg of aspirin daily, AVNRT and episodes of lightheadedness for which she follows with the cardiologist, HTN, s/p R carotid endarterectomy in 2015 after episodes of amaurosis fugax to the right eye, who presents s/p vision changes to the L eye.   Symptoms occurred at approximately 1:30 PM this afternoon. Patient was at home with her son, who supplements the history. Patient states that she had sudden onset of blurry vision accompanied by some intermittent double vision and "bright lightning spots" in her left eye. No associated pain. Resolved after proximally 20 minutes. Patient denies any confusion, facial droop, difficulty speaking, focal weakness or sensory deficits, or difficulty ambulating during the episode. Her son confirms that she had no other findings. He does state that the patient seemed more tired than usual for a couple of hours after the episode, and patient confirms that she felt generally weak for a short period of time after the episode. States that she now feels "completely normal." States that her vision has returned to its baseline. Denies episodes of lightheadedness or dizziness today.   Past Medical History:  Diagnosis Date  . Anemia, unspecified   . Anxiety state, unspecified   . Aortic valve disorders   . Bradycardia    s/p PPM  . Bursitis   . Complication of anesthesia    has had difficulty awakening in past; pt states did well with the anesthesia given during her carotid surgery   . Diabetes mellitus without complication ([place])   . Diverticulosis of colon (without mention of hemorrhage)   . Herpes zoster with other nervous system complications(053.19)   . Hyperlipidemia   . Hypertension     . Hypothyroidism   . Irritable bowel syndrome   . Menopause   . Osteoarthrosis, unspecified whether generalized or localized, unspecified site   . Peripheral vascular disease ([place])   . TIA (transient ischemic attack)   . Unspecified hearing loss   . Varicose veins     Patient Active Problem List   Diagnosis Date Noted  . Amaurosis 07/03/2016  . Diet-controlled type 2 diabetes mellitus ([place]) 02/29/2016  . Vertigo 12/11/2015  . Gait disorder 06/27/2015  . OA (osteoarthritis) of hip 03/23/2015  . Preop exam for internal medicine 03/04/2015  . Shortness of breath 02/13/2015  . Rhinitis, atrophic 11/22/2014  . Well adult exam 04/21/2014  . Amaurosis fugax 03/31/2014  . History of CEA (carotid endarterectomy) 03/31/2014  . HLD (hyperlipidemia) 03/31/2014  . Carotid stenosis 02/07/2014  . Cerebral infarction ([place]) 01/16/2014  . Dyslipidemia 01/16/2014  . Weakness generalized 04/30/2013  . CAP (community acquired pneumonia) 04/29/2013  . Edema 03/22/2013  . Urinary incontinence in female 03/22/2013  . Constipation 10/01/2012  . UTI (urinary tract infection) 10/01/2012  . Diarrhea 08/26/2012  . Cerumen impaction 12/11/2011  . Pacemaker-Medtronic 07/30/2010  . SINUS BRADYCARDIA 05/17/2010  . DYSURIA 01/29/2010  . Aortic valve disorders 07/31/2009  . Venous (peripheral) insufficiency 07/31/2009  . Hyponatremia 07/01/2009  . LEG CRAMPS 06/12/2009  . LEG PAIN, BILATERAL 02/01/2009  . Backache 08/24/2008  . Anemia 06/21/2008  . DEGENERATIVE JOINT DISEASE 02/19/2008  . CHEST WALL PAIN, HX OF 02/19/2008  . IRRITABLE BOWEL SYNDROME 12/28/2007  .  DIABETES MELLITUS, BORDERLINE 11/06/2007  . GASTRITIS 07/08/2007  . POSTHERPETIC NEURALGIA 07/01/2007  . Herpes zoster without mention of complication 64/33/2951  . Hypothyroidism 06/08/2007  . HYPERLIPIDEMIA 06/08/2007  . Anxiety state 06/08/2007  . Hereditary and idiopathic peripheral neuropathy 06/08/2007  . HEARING LOSS 06/08/2007   . Essential hypertension 06/08/2007  . Transient cerebral ischemia 06/08/2007  . BRONCHITIS, RECURRENT 06/08/2007  . DIVERTICULOSIS OF COLON 06/08/2007  . Osteoporosis 06/08/2007    Past Surgical History:  Procedure Laterality Date  . APPENDECTOMY    . CATARACT EXTRACTION, BILATERAL  end of June 2013  . CHOLECYSTECTOMY    . DILATION AND CURETTAGE OF UTERUS    . ENDARTERECTOMY Right 01/20/2014   Procedure: RIGHT CAROTID ARTERY ENDARTERECTOMY WITH DACRON PATCH ANGIOPLASTY;  Surgeon: Mal Misty, MD;  Location: [facility];  Service: Vascular;  Laterality: Right;  . PACEMAKER INSERTION     MDT dual chamber PPM implanted by Dr [name] for sinus node dysfunction   . TONSILLECTOMY    . TOTAL HIP ARTHROPLASTY Left 03/23/2015   Procedure: TOTAL HIP ARTHROPLASTY ANTERIOR APPROACH;  Surgeon: Gaynelle Arabian, MD;  Location: [facility];  Service: Orthopedics;  Laterality: Left;  [name] VAGINAL HYSTERECTOMY      OB History    No data available       Home Medications    Prior to Admission medications   Medication Sig Start Date End Date Taking? Authorizing Provider  ALPHA LIPOIC ACID PO Take 1 tablet by mouth daily.   Yes Historical Provider, MD  aspirin EC 81 MG tablet Take 81 mg by mouth daily.   Yes Historical Provider, MD  CRANBERRY PO Take 1 tablet by mouth daily with supper.   Yes Historical Provider, MD  Cyanocobalamin (VITAMIN B-12 PO) Take 1 tablet by mouth at bedtime.   Yes Historical Provider, MD  Docusate Calcium (STOOL SOFTENER PO) Take 2 capsules by mouth at bedtime.   Yes Historical Provider, MD  EVENING PRIMROSE OIL PO Take 1 capsule by mouth daily.   Yes Historical Provider, MD  fluticasone (FLONASE ALLERGY RELIEF) 50 MCG/ACT nasal spray Place 1 spray into both nostrils daily.    Yes Historical Provider, MD  ipratropium (ATROVENT) 0.06 % nasal spray Place 2 sprays into both nostrils 3 (three) times daily. 02/20/16  Yes Aleksei Plotnikov V, MD  levothyroxine (SYNTHROID, LEVOTHROID) 125 MCG  tablet TAKE 1 TABLET DAILY BEFORE BREAKFAST 03/12/16  Yes Aleksei Plotnikov V, MD  loratadine (CLARITIN) 10 MG tablet Take 10 mg by mouth daily.    Yes Historical Provider, MD  losartan (COZAAR) 25 MG tablet Take 1 tablet (25 mg total) by mouth 2 (two) times daily. Patient taking differently: Take 25 mg by mouth daily as needed.  07/02/16  Yes Charlene Brooke Nche, NP  LYRICA 75 MG capsule Take 75 mg by mouth 2 (two) times daily. 04/09/16  Yes Historical Provider, MD  meclizine (ANTIVERT) 12.5 MG tablet Take 1 tablet (12.5 mg total) by mouth 3 (three) times daily as needed for dizziness. 11/27/15  Yes Lucretia Kern, DO  metoprolol tartrate (LOPRESSOR) 25 MG tablet Take 1 tablet (25 mg total) by mouth 2 (two) times daily. 06/18/16 09/16/16 Yes Amber Sena [place], NP  Multiple Vitamin (MULTIVITAMIN WITH MINERALS) TABS tablet Take 1 tablet by mouth daily with supper.   Yes Historical Provider, MD  Multiple Vitamins-Minerals (EYE VITAMINS) CAPS Take 1 capsule by mouth daily.   Yes Historical Provider, MD  Polyvinyl Alcohol-Povidone (REFRESH OP) Place 1 drop into both  eyes 2 (two) times daily.   Yes Historical Provider, MD  simvastatin (ZOCOR) 40 MG tablet Take 1 tablet (40 mg total) by mouth daily with supper. 11/06/15  Yes Aleksei Plotnikov V, MD  Lancets (ONETOUCH ULTRASOFT) lancets Use to check blood sugars once a day Dx E11.9 10/10/15   [name], MD  ONE TOUCH ULTRA TEST test strip TEST ONCE DAILY AS DIRECTED 10/01/15   [name], MD    Family History Family History  Problem Relation Age of Onset  . Heart disease Mother   . Heart disease Father   . Diabetes Father   . Cancer Paternal Grandmother     Social History Social History  Substance Use Topics  . Smoking status: Never Smoker  . Smokeless tobacco: Never Used  . Alcohol use No     Allergies   Penicillins; Alprazolam; Bactrim [sulfamethoxazole-trimethoprim]; Ceclor [cefaclor]; Cephalexin; Ciprofloxacin;  Clindamycin/lincomycin; Hydrocodone; Noroxin [norfloxacin]; Sulfonamide derivatives; Tetracyclines & related; and Tramadol   Review of Systems Review of Systems  Constitutional: Negative for chills, fatigue and fever.  HENT: Negative for congestion, rhinorrhea and sore throat.   Eyes: Positive for visual disturbance (resolved). Negative for photophobia, pain, discharge and redness.  Respiratory: Negative for cough, shortness of breath and wheezing.   Cardiovascular: Negative for chest pain and palpitations.  Gastrointestinal: Negative for abdominal pain, diarrhea, nausea and vomiting.  Genitourinary: Negative for dysuria, flank pain and frequency.  Musculoskeletal: Negative for arthralgias, back pain, joint swelling, neck pain and neck stiffness.  Skin: Negative for color change and rash.  Neurological: Negative for dizziness, tremors, seizures, syncope, facial asymmetry, speech difficulty, weakness, light-headedness, numbness and headaches.  Psychiatric/Behavioral: Negative for agitation, behavioral problems and confusion.     Physical Exam Updated Vital Signs BP (!) 158/68   Pulse 60   Temp 97.3 F (36.3 C) (Oral)   Resp 20   Ht 5\' 5"  (1.651 m)   Wt 67.1 kg   LMP  (LMP Unknown)   SpO2 99%   BMI 24.63 kg/m   Physical Exam  Constitutional: She is oriented to person, place, and time. She appears well-developed and well-nourished. No distress.  HENT:  Head: Normocephalic and atraumatic.  Right Ear: External ear normal.  Left Ear: External ear normal.  Nose: Nose normal.  Mouth/Throat: Oropharynx is clear and moist. No oropharyngeal exudate.  Eyes: Conjunctivae and EOM are normal. Pupils are equal, round, and reactive to light. Right eye exhibits no discharge. Left eye exhibits no discharge.  Visual fields intact to confrontation. Visual acuity 20/30 on the left, 20/20 on the right with readers (baseline per the pt)  Neck: Normal range of motion. Neck supple.  Cardiovascular:  Normal rate, regular rhythm, normal heart sounds and intact distal pulses.  Exam reveals no gallop and no friction rub.   No murmur heard. Pulmonary/Chest: Effort normal and breath sounds normal. No respiratory distress. She has no wheezes. She has no rales.  Abdominal: Soft. Bowel sounds are normal. She exhibits no distension. There is no tenderness. There is no guarding.  Musculoskeletal: Normal range of motion. She exhibits no edema or tenderness.  Neurological: She is alert and oriented to person, place, and time. She exhibits normal muscle tone.  Face symmetric, tongue midline. 5/5 strength in the proximal and distal upper and lower extremities bilaterally, with intact sensation to light touch. Normal finger to nose, heel to shin, and rapid alternating movements. Normal speech.    Skin: Skin is warm and dry. No rash noted. She  is not diaphoretic.  Psychiatric: She has a normal mood and affect. Her behavior is normal. Judgment and thought content normal.     ED Treatments / Results  Labs (all labs ordered are listed, but only abnormal results are displayed) Labs Reviewed  CBC - Abnormal; Notable for the following:       Result Value   RBC 3.69 (*)    Hemoglobin 11.2 (*)    HCT 34.8 (*)    All other components within normal limits  COMPREHENSIVE METABOLIC PANEL - Abnormal; Notable for the following:    Glucose, Bld 194 (*)    BUN 27 (*)    Total Protein 6.1 (*)    GFR calc non Af Amer 50 (*)    GFR calc Af Amer 57 (*)    All other components within normal limits  CBG MONITORING, ED - Abnormal; Notable for the following:    Glucose-Capillary 173 (*)    All other components within normal limits  I-STAT CHEM 8, ED - Abnormal; Notable for the following:    BUN 29 (*)    Glucose, Bld 191 (*)    Hemoglobin 11.2 (*)    HCT 33.0 (*)    All other components within normal limits  URINE CULTURE  PROTIME-INR  APTT  DIFFERENTIAL  GLUCOSE, CAPILLARY  HEMOGLOBIN A1C  LIPID PANEL    URINALYSIS, ROUTINE W REFLEX MICROSCOPIC  TSH  I-STAT TROPOININ, ED    EKG  EKG Interpretation  Date/Time:  Thursday July 03 2016 15:39:06 EDT Ventricular Rate:  60 PR Interval:    QRS Duration: 136 QT Interval:  448 QTC Calculation: 448 R Axis:   -48 Text Interpretation:  Atrial-paced rhythm with prolonged AV conduction Left axis deviation Left bundle branch block Abnormal ECG No significant change since last tracing Non-specific change in V6 Confirmed by ISAACS MD, Lysbeth Galas [phone]) on 07/03/2016 3:53:32 PM Also confirmed by Ellender Hose MD, [place] [phone]), editor [place] CT, Leda Gauze [phone])  on 07/03/2016 4:38:55 PM       Radiology Ct Angio Head W Or Wo Contrast  Result Date: 07/03/2016 CLINICAL DATA:  Initial evaluation for left-sided visual disturbance. EXAM: CT ANGIOGRAPHY HEAD AND NECK TECHNIQUE: Multidetector CT imaging of the head and neck was performed using the standard protocol during bolus administration of intravenous contrast. Multiplanar CT image reconstructions and MIPs were obtained to evaluate the vascular anatomy. Carotid stenosis measurements (when applicable) are obtained utilizing NASCET criteria, using the distal internal carotid diameter as the denominator. CONTRAST:  50 cc of Isovue 370. COMPARISON:  Prior CT from earlier same day. FINDINGS: CTA NECK FINDINGS Aortic arch: Aortic arch and origin of the great vessels incompletely evaluated on this exam. Scattered plaque noted within the partially visualized origin about the partially visualized origin of the left subclavian artery without flow-limiting stenosis subclavian arteries distally without significant stenosis. Right carotid system: Right common carotid artery mildly tortuous proximally. Scattered calcified plaque within the right common carotid artery without flow limiting stenosis. Sequela prior endarterectomy present about the right carotid bifurcation. No residual stenosis. Right ICA widely patent distally to the  skullbase without stenosis. Left carotid system: Origin of the left common carotid artery not visualized. Mild a centric plaque about the of left common carotid artery without flow-limiting stenosis. Moderate calcified plaque about the left bifurcation/proximal left ICA. Relatively mild narrowing of approximately 25% by NASCET criteria. Left ICA patent distally without stenosis. Vertebral arteries: Both of the vertebral arteries arise from the subclavian arteries. Minimal plaque within the  pre foraminal left V1 segment without stenosis. Left V1 segment tortuous. Mild focal stenosis proximal V2 segments bilaterally related to degenerative disc osteophyte at the level of C5-6. Vertebral arteries otherwise widely patent without stenosis or occlusion. Skeleton: No acute osseous abnormality. No worrisome lytic or blastic osseous lesions. Multilevel degenerative spondylolysis noted, greatest at C5-6. Multilevel facet arthropathy within the upper cervical spine. Other neck: Soft tissues of the neck demonstrate no acute abnormality. No adenopathy. Salivary glands normal. Thyroid normal. Upper chest: Upper mediastinum within normal limits. Visualized lungs are clear. Review of the MIP images confirms the above findings CTA HEAD FINDINGS Anterior circulation: The petrous segments widely patent bilaterally. Smooth atheromatous plaque throughout the cavernous/supraclinoid ICAs without significant stenosis. ICA termini widely patent. A1 segments patent. Anterior communicating artery normal. Anterior cerebral arteries patent to their distal aspects. M1 segments widely patent without stenosis or occlusion. Distal MCA branches widely patent and symmetric. Posterior circulation: Minimal plaque within the bilateral V4 segments without significant stenosis. Left vertebral artery slightly dominant. Posterior inferior cerebral arteries patent bilaterally. Basilar artery widely patent. Superior cerebral arteries patent bilaterally. Both  of the posterior cerebral arteries supplied via the basilar and are widely patent to their distal aspects. Venous sinuses: Patent. Anatomic variants: No aneurysm or vascular malformation. Tiny 2 mm focal outpouching arising from the supraclinoid right ICA favored to reflect a normal vascular infundibulum related to a hypoplastic right P com. No significant anatomic variant. Delayed phase: No pathologic enhancement. Review of the MIP images confirms the above findings IMPRESSION: 1. Negative CTA for emergent large vessel occlusion. 2. Calcified atheromatous plaque at the left carotid bifurcation with mild stenosis of approximately 25% by NASCET criteria. 3. Sequelae of prior right CEA without residual stenosis. 4. Smooth atheromatous plaque throughout the carotid siphons with mild diffuse narrowing. Electronically Signed   By: Jeannine Boga M.D.   On: 07/03/2016 21:01   Ct Head Wo Contrast  Result Date: 07/03/2016 CLINICAL DATA:  Blurred vision. EXAM: CT HEAD WITHOUT CONTRAST TECHNIQUE: Contiguous axial images were obtained from the base of the skull through the vertex without intravenous contrast. COMPARISON:  CT scan of June 01, 2016. FINDINGS: Brain: Mild chronic ischemic white matter disease is noted. No mass effect or midline shift is noted. Ventricular size is within normal limits. There is no evidence of mass lesion, hemorrhage or acute infarction. Vascular: Atherosclerosis of carotid siphons is noted. Skull: Normal. Negative for fracture or focal lesion. Sinuses/Orbits: No acute finding. Other: None. IMPRESSION: Mild chronic ischemic white matter disease. No acute intracranial abnormality seen. Electronically Signed   By: Marijo Conception, M.D.   On: 07/03/2016 16:36   Ct Angio Neck W And/or Wo Contrast  Result Date: 07/03/2016 CLINICAL DATA:  Initial evaluation for left-sided visual disturbance. EXAM: CT ANGIOGRAPHY HEAD AND NECK TECHNIQUE: Multidetector CT imaging of the head and neck was  performed using the standard protocol during bolus administration of intravenous contrast. Multiplanar CT image reconstructions and MIPs were obtained to evaluate the vascular anatomy. Carotid stenosis measurements (when applicable) are obtained utilizing NASCET criteria, using the distal internal carotid diameter as the denominator. CONTRAST:  50 cc of Isovue 370. COMPARISON:  Prior CT from earlier same day. FINDINGS: CTA NECK FINDINGS Aortic arch: Aortic arch and origin of the great vessels incompletely evaluated on this exam. Scattered plaque noted within the partially visualized origin about the partially visualized origin of the left subclavian artery without flow-limiting stenosis subclavian arteries distally without significant stenosis. Right carotid system: Right common  carotid artery mildly tortuous proximally. Scattered calcified plaque within the right common carotid artery without flow limiting stenosis. Sequela prior endarterectomy present about the right carotid bifurcation. No residual stenosis. Right ICA widely patent distally to the skullbase without stenosis. Left carotid system: Origin of the left common carotid artery not visualized. Mild a centric plaque about the of left common carotid artery without flow-limiting stenosis. Moderate calcified plaque about the left bifurcation/proximal left ICA. Relatively mild narrowing of approximately 25% by NASCET criteria. Left ICA patent distally without stenosis. Vertebral arteries: Both of the vertebral arteries arise from the subclavian arteries. Minimal plaque within the pre foraminal left V1 segment without stenosis. Left V1 segment tortuous. Mild focal stenosis proximal V2 segments bilaterally related to degenerative disc osteophyte at the level of C5-6. Vertebral arteries otherwise widely patent without stenosis or occlusion. Skeleton: No acute osseous abnormality. No worrisome lytic or blastic osseous lesions. Multilevel degenerative spondylolysis  noted, greatest at C5-6. Multilevel facet arthropathy within the upper cervical spine. Other neck: Soft tissues of the neck demonstrate no acute abnormality. No adenopathy. Salivary glands normal. Thyroid normal. Upper chest: Upper mediastinum within normal limits. Visualized lungs are clear. Review of the MIP images confirms the above findings CTA HEAD FINDINGS Anterior circulation: The petrous segments widely patent bilaterally. Smooth atheromatous plaque throughout the cavernous/supraclinoid ICAs without significant stenosis. ICA termini widely patent. A1 segments patent. Anterior communicating artery normal. Anterior cerebral arteries patent to their distal aspects. M1 segments widely patent without stenosis or occlusion. Distal MCA branches widely patent and symmetric. Posterior circulation: Minimal plaque within the bilateral V4 segments without significant stenosis. Left vertebral artery slightly dominant. Posterior inferior cerebral arteries patent bilaterally. Basilar artery widely patent. Superior cerebral arteries patent bilaterally. Both of the posterior cerebral arteries supplied via the basilar and are widely patent to their distal aspects. Venous sinuses: Patent. Anatomic variants: No aneurysm or vascular malformation. Tiny 2 mm focal outpouching arising from the supraclinoid right ICA favored to reflect a normal vascular infundibulum related to a hypoplastic right P com. No significant anatomic variant. Delayed phase: No pathologic enhancement. Review of the MIP images confirms the above findings IMPRESSION: 1. Negative CTA for emergent large vessel occlusion. 2. Calcified atheromatous plaque at the left carotid bifurcation with mild stenosis of approximately 25% by NASCET criteria. 3. Sequelae of prior right CEA without residual stenosis. 4. Smooth atheromatous plaque throughout the carotid siphons with mild diffuse narrowing. Electronically Signed   By: Jeannine Boga M.D.   On: 07/03/2016  21:01    Procedures Procedures (including critical care time)  Medications Ordered in ED Medications  pregabalin (LYRICA) capsule 75 mg (75 mg Oral Given 07/03/16 2246)  multivitamin with minerals tablet 1 tablet (not administered)  polyvinyl alcohol (LIQUIFILM TEARS) 1.4 % ophthalmic solution 1 drop (not administered)  levothyroxine (SYNTHROID, LEVOTHROID) tablet 125 mcg (not administered)  ipratropium (ATROVENT) 0.06 % nasal spray 2 spray (not administered)  meclizine (ANTIVERT) tablet 12.5 mg (not administered)  simvastatin (ZOCOR) tablet 40 mg (not administered)  loratadine (CLARITIN) tablet 10 mg (not administered)  fluticasone (FLONASE) 50 MCG/ACT nasal spray 1 spray (not administered)   stroke: mapping our early stages of recovery book (not administered)  0.9 %  sodium chloride infusion (not administered)  acetaminophen (TYLENOL) tablet 650 mg (not administered)    Or  acetaminophen (TYLENOL) solution 650 mg (not administered)    Or  acetaminophen (TYLENOL) suppository 650 mg (not administered)  senna-docusate (Senokot-S) tablet 1 tablet (not administered)  enoxaparin (LOVENOX) injection 40  mg (not administered)  clopidogrel (PLAVIX) tablet 75 mg (75 mg Oral Given 07/03/16 2246)  insulin aspart (novoLOG) injection 0-9 Units (not administered)  hydrALAZINE (APRESOLINE) injection 10 mg (not administered)  iopamidol (ISOVUE-370) 76 % injection (50 mLs  Contrast Given 07/03/16 2015)     Initial Impression / Assessment and Plan / ED Course  I have reviewed the triage vital signs and the nursing notes.  Pertinent labs & imaging results that were available during my care of the patient were reviewed by me and considered in my medical decision making (see chart for details).     Generally well-appearing. Afebrile and hemodynamically stable. Neuro exam unremarkable as above. Visual acuity is now at baseline, with visual fields intact to confrontation. Symptoms lasted approximately  20 minutes, and I have a low suspicion for a primary ocular pathology such as glaucoma or retinal detachment.  On review of the medical record, patient has had amaurosis fugax to the right eye in the past, and recent imaging showed 60% stenosis of her left carotid. Suspect amaurosis fugax as the cause of her symptoms in her left eye earlier today. Neurology consulted and recommend CTA for further evaluation. Patient will be admitted to the hospitalist service for TIA workup. Patient admitted in stable condition.  Care of patient overseen by my attending, Dr. Ellender Hose.  Final Clinical Impressions(s) / ED Diagnoses   Final diagnoses:  None    New Prescriptions Current Discharge Medication List       Zipporah Plants, MD 07/03/16 [id number]    Duffy Bruce, MD 07/04/16 [phone]

## 2016-07-04 ENCOUNTER — Observation Stay (HOSPITAL_BASED_OUTPATIENT_CLINIC_OR_DEPARTMENT_OTHER): Payer: MEDICARE

## 2016-07-04 DIAGNOSIS — G453 Amaurosis fugax: Secondary | ICD-10-CM | POA: Diagnosis not present

## 2016-07-04 DIAGNOSIS — G459 Transient cerebral ischemic attack, unspecified: Secondary | ICD-10-CM | POA: Diagnosis not present

## 2016-07-04 DIAGNOSIS — I6789 Other cerebrovascular disease: Secondary | ICD-10-CM | POA: Diagnosis not present

## 2016-07-04 DIAGNOSIS — I10 Essential (primary) hypertension: Secondary | ICD-10-CM | POA: Diagnosis not present

## 2016-07-04 DIAGNOSIS — E784 Other hyperlipidemia: Secondary | ICD-10-CM | POA: Diagnosis not present

## 2016-07-04 LAB — URINALYSIS, ROUTINE W REFLEX MICROSCOPIC
Bilirubin Urine: NEGATIVE
Glucose, UA: NEGATIVE mg/dL
Ketones, ur: NEGATIVE mg/dL
Nitrite: NEGATIVE
PH: 6 (ref 5.0–8.0)
Protein, ur: NEGATIVE mg/dL
SPECIFIC GRAVITY, URINE: 1.011 (ref 1.005–1.030)

## 2016-07-04 LAB — ECHOCARDIOGRAM COMPLETE
Area-P 1/2: 2.56 cm2
CHL CUP DOP CALC LVOT VTI: 26 cm
CHL CUP LVOT MV VTI INDEX: 0.78 cm2/m2
CHL CUP RV SYS PRESS: 30 mmHg
CHL CUP TV REG PEAK VELOCITY: 261 cm/s
E/e' ratio: 15.05
FS: 19 % — AB (ref 28–44)
Height: 65 in
IVS/LV PW RATIO, ED: 0.92
LA ID, A-P, ES: 35 mm
LA diam index: 2.01 cm/m2
LA vol: 41.1 mL
LAVOLA4C: 35.9 mL
LAVOLIN: 23.6 mL/m2
LEFT ATRIUM END SYS DIAM: 35 mm
LV E/e' medial: 15.05
LV PW d: 12 mm — AB (ref 0.6–1.1)
LV TDI E'MEDIAL: 6.42
LV e' LATERAL: 7.51 cm/s
LVEEAVG: 15.05
LVOT MV VTI: 1.35
LVOT area: 2.54 cm2
LVOT peak grad rest: 3 mmHg
LVOT peak vel: 93.3 cm/s
LVOTD: 18 mm
LVOTSV: 66 mL
Lateral S' vel: 12.7 cm/s
MV M vel: 101
MV pk A vel: 163 m/s
MVANNULUSVTI: 49.1 cm
MVG: 5 mmHg
MVPG: 5 mmHg
MVPKEVEL: 113 m/s
MVSPHT: 86 ms
RV TAPSE: 19.1 mm
TDI e' lateral: 7.51
TRMAXVEL: 261 cm/s
Weight: 2368 oz

## 2016-07-04 LAB — LIPID PANEL
CHOL/HDL RATIO: 2.7 ratio
Cholesterol: 145 mg/dL (ref 0–200)
HDL: 54 mg/dL (ref >40)
LDL CALC: 77 mg/dL (ref 0–99)
TRIGLYCERIDES: 71 mg/dL (ref <150)
VLDL: 14 mg/dL (ref 0–40)

## 2016-07-04 LAB — GLUCOSE, CAPILLARY
GLUCOSE-CAPILLARY: 100 mg/dL — AB (ref 65–99)
GLUCOSE-CAPILLARY: 130 mg/dL — AB (ref 65–99)
Glucose-Capillary: 98 mg/dL (ref 65–99)

## 2016-07-04 LAB — TSH: TSH: 0.195 u[IU]/mL — ABNORMAL LOW (ref 0.350–4.500)

## 2016-07-04 MED ORDER — CLOPIDOGREL BISULFATE 75 MG PO TABS: 75.0000 mg | ORAL_TABLET | Freq: Every day | ORAL | 0 refills | Status: DC

## 2016-07-04 MED ORDER — LEVOTHYROXINE SODIUM 100 MCG PO TABS: 100.0000 ug | ORAL_TABLET | Freq: Every day | ORAL | 0 refills | Status: DC

## 2016-07-04 NOTE — Progress Notes (Signed)
  Speech Language Pathology  Patient Details Name: Kayla Davis MRN: [id number] DOB: [birth year] Today's Date: 07/04/2016 Time: 3382-5053 SLP Time Calculation (min) (ACUTE ONLY): 21 min   Swallow assessment completed. See progress note. Speech-language-cognitive eval ordered however informally screened to be within normal limits. No formal assessment needed.                Houston Siren 07/04/2016, 9:01 AM   Orbie Pyo Colvin Caroli.Ed [employer] [phone]

## 2016-07-04 NOTE — Progress Notes (Signed)
  Echocardiogram 2D Echocardiogram has been performed.  Kayla Davis 07/04/2016, 5:11 PM

## 2016-07-04 NOTE — Evaluation (Signed)
Clinical/Bedside Swallow Evaluation Patient Details  Name: Kayla Davis MRN: [id number] Date of Birth: [birth year]  Today's Date: 07/04/2016 Time: SLP Start Time (ACUTE ONLY): 3007 SLP Stop Time (ACUTE ONLY): 0850 SLP Time Calculation (min) (ACUTE ONLY): 21 min  Past Medical History:  Past Medical History:  Diagnosis Date  . Anemia, unspecified   . Anxiety state, unspecified   . Aortic valve disorders   . Bradycardia    s/p PPM  . Bursitis   . Complication of anesthesia    has had difficulty awakening in past; pt states did well with the anesthesia given during her carotid surgery   . Diabetes mellitus without complication ([place])   . Diverticulosis of colon (without mention of hemorrhage)   . Herpes zoster with other nervous system complications(053.19)   . Hyperlipidemia   . Hypertension   . Hypothyroidism   . Irritable bowel syndrome   . Menopause   . Osteoarthrosis, unspecified whether generalized or localized, unspecified site   . Peripheral vascular disease ([place])   . TIA (transient ischemic attack)   . Unspecified hearing loss   . Varicose veins    Past Surgical History:  Past Surgical History:  Procedure Laterality Date  . APPENDECTOMY    . CATARACT EXTRACTION, BILATERAL  end of June 2013  . CHOLECYSTECTOMY    . DILATION AND CURETTAGE OF UTERUS    . ENDARTERECTOMY Right 01/20/2014   Procedure: RIGHT CAROTID ARTERY ENDARTERECTOMY WITH DACRON PATCH ANGIOPLASTY;  Surgeon: Mal Misty, MD;  Location: [facility];  Service: Vascular;  Laterality: Right;  . PACEMAKER INSERTION     MDT dual chamber PPM implanted by Dr [name] for sinus node dysfunction   . TONSILLECTOMY    . TOTAL HIP ARTHROPLASTY Left 03/23/2015   Procedure: TOTAL HIP ARTHROPLASTY ANTERIOR APPROACH;  Surgeon: Gaynelle Arabian, MD;  Location: [facility];  Service: Orthopedics;  Laterality: Left;  [name] VAGINAL HYSTERECTOMY     HPI:  Kayla Previti Robinsonis a 83 y.o.femalewith medical history of TIA, carotid stenosis  status post right CEAon 01/20/2014, hypertension, hyperlipidemia, diabetes mellitus, hypothyroidism presents with visual disturbance of the left eye manifested by diplopia, flashing lights, and blurry vision ,  CT of the brain was negative for acute findings.    Assessment / Plan / Recommendation Clinical Impression  Oral and pharyngeal swallow normal without indications of decompnesation. Pt failed swallow screen due to cough x 1 with thin. Pt and family deny difficulty. Pt is cognitively intact. Ordered regular texture (car mob), thin liquids, pills with water. No further ST needed.  SLP Visit Diagnosis: Dysphagia, unspecified (R13.10)    Aspiration Risk  Mild aspiration risk    Diet Recommendation Regular;Thin liquid   Liquid Administration via: Cup;Straw Medication Administration: Whole meds with liquid Supervision: Patient able to self feed Postural Changes: Seated upright at 90 degrees    Other  Recommendations Oral Care Recommendations: Oral care BID   Follow up Recommendations None      Frequency and Duration            Prognosis        Swallow Study   General HPI: Kayla Lynn Robinsonis a 83 y.o.femalewith medical history of TIA, carotid stenosis status post right CEAon 01/20/2014, hypertension, hyperlipidemia, diabetes mellitus, hypothyroidism presents with visual disturbance of the left eye manifested by diplopia, flashing lights, and blurry vision ,  CT of the brain was negative for acute findings.  Type of Study: Bedside Swallow Evaluation Previous Swallow Assessment:  (none) Diet Prior  to this Study: NPO Temperature Spikes Noted: No Respiratory Status: Room air History of Recent Intubation: No Behavior/Cognition: Alert;Cooperative;Pleasant mood Oral Cavity Assessment: Within Functional Limits Oral Care Completed by SLP: No Oral Cavity - Dentition: Dentures, top (bottom partial) Vision: Functional for self-feeding Self-Feeding Abilities: Able to feed  self Patient Positioning: Upright in bed Baseline Vocal Quality: Normal Volitional Cough: Strong Volitional Swallow: Able to elicit    Oral/Motor/Sensory Function Overall Oral Motor/Sensory Function: Within functional limits   Ice Chips Ice chips: Not tested   Thin Liquid Thin Liquid: Within functional limits Presentation: Cup;Straw    Nectar Thick Nectar Thick Liquid: Not tested   Honey Thick Honey Thick Liquid: Not tested   Puree Puree: Not tested   Solid   GO   Solid: Within functional limits        [place] Siren 07/04/2016,9:00 AM   Orbie Pyo Colvin Caroli[employer]Ed [employer] [phone]

## 2016-07-04 NOTE — Progress Notes (Signed)
Pt was admitted from  ED per stretcher accompanied by a nurse tech and pt family she was fully alert and oriented self introduced to pt and family ID bracelet checked fall assessment and fall prevention plan discussed with pt walked fine with walker to the bathroom  Pt kept on bed alarm and  Reason for it has been explain to pt, call light and phone within  reach and pt able to demonstrate how to use them Pt swallow water with no problem no cough gave her gram cracker and peanut butter with diet ginger ale ate all under my supervision took her oral pills correctly with water prescribed med given will continue to monitor pt

## 2016-07-04 NOTE — Progress Notes (Signed)
PROGRESS NOTE        PATIENT DETAILS Name: Kayla Davis Age: 83 y.o. Sex: female Date of Birth: 1922/05/15 Admit Date: 07/03/2016 Admitting Physician Orson Eva, MD VXY:IAXK Plotnikov, MD  Brief Narrative: Patient is a 83 y.o. female with prior history of TIA status post right CVA 2015, hypertension, dyslipidemia, and diabetes, hypothyroidism admitted with transient onset of left eye blurry vision, diplopia. Thought to have amaurosis fugax, and admitted for further evaluation and treatment. See below for further details  Subjective: No further eye symptoms since admission. No chest pain or shortness of breath.  Assessment/Plan: TIA/amaurosis fugax: No further symptoms, CTA neck there are any significant stenosis, CT head negative for acute abnormalities. Unable to perform MRI brain due to pacemaker. LDL 77, A1c pending. Await echo-spoke with Dr. Alvino Chapel does not show any major abnormalities, stable for discharge. Continue Plavix, statin  Hypertension: Blood pressure relatively stable, resume antihypertensives on discharge.  Hypothyroidism: Continue Synthroid.  History of symptomatic bradycardia-status post permanent pacemaker  DVT Prophylaxis: Prophylactic Lovenox   Code Status: Full code   Family Communication: None at bedside  Disposition Plan: Remain inpatient  Antimicrobial agents: Anti-infectives    None      Procedures: None  CONSULTS:  neurology  Time spent: 25- minutes-Greater than 50% of this time was spent in counseling, explanation of diagnosis, planning of further management, and coordination of care.  MEDICATIONS: Scheduled Meds: . clopidogrel  75 mg Oral Daily  . enoxaparin (LOVENOX) injection  40 mg Subcutaneous Q24H  . fluticasone  1 spray Each Nare Daily  . insulin aspart  0-9 Units Subcutaneous TID WC  . ipratropium  2 spray Each Nare TID  . levothyroxine  125 mcg Oral QAC breakfast  . loratadine   10 mg Oral Daily  . multivitamin with minerals  1 tablet Oral Q supper  . polyvinyl alcohol  1 drop Both Eyes BID  . pregabalin  75 mg Oral BID  . simvastatin  40 mg Oral Q supper   Continuous Infusions: . sodium chloride     PRN Meds:.acetaminophen **OR** acetaminophen (TYLENOL) oral liquid 160 mg/5 mL **OR** acetaminophen, hydrALAZINE, meclizine, senna-docusate   PHYSICAL EXAM: Vital signs: Vitals:   07/03/16 1834 07/03/16 2158 07/04/16 0150 07/04/16 0607  BP:  (!) 158/68 (!) 144/50 (!) 121/55  Pulse:  60 60 61  Resp:   18 16  Temp: 98.3 F (36.8 C) 97.3 F (36.3 C) 97.6 F (36.4 C) 98.1 F (36.7 C)  TempSrc:  Oral Oral Oral  SpO2:  99% 96% 96%  Weight:      Height:       Filed Weights   07/03/16 1537  Weight: 67.1 kg (148 lb)   Body mass index is 24.63 kg/m.   General appearance :Awake, alert, not in any distress.  Eyes:, pupils equally reactive to light and accomodation,no scleral icterus. HEENT: Atraumatic and Normocephalic Neck: supple, no JVD. No cervical lymphadenopathy.  Resp:Good air entry bilaterally CVS: S1 S2 regular.  GI: Bowel sounds present, Non tender and not distended with no gaurding, rigidity or rebound. Extremities: B/L Lower Ext shows no edema, both legs are warm to touch Neurology:  speech clear,Non focal, sensation is grossly intact. Musculoskeletal:No digital cyanosis Skin:No Rash, warm and dry Wounds:N/A  I have personally reviewed following labs and imaging studies  LABORATORY DATA: CBC:  Recent Labs  Lab 07/03/16 1549 07/03/16 1618  WBC 6.7  --   NEUTROABS 3.8  --   HGB 11.2* 11.2*  HCT 34.8* 33.0*  MCV 94.3  --   PLT 205  --     Basic Metabolic Panel:  Recent Labs Lab 07/03/16 1549 07/03/16 1618  NA 135 136  K 4.8 4.3  CL 102 101  CO2 26  --   GLUCOSE 194* 191*  BUN 27* 29*  CREATININE 0.97 0.90  CALCIUM 9.1  --     GFR: Estimated Creatinine Clearance: 36.6 mL/min (by C-G formula based on SCr of 0.9  mg/dL).  Liver Function Tests:  Recent Labs Lab 07/03/16 1549  AST 24  ALT 24  ALKPHOS 76  BILITOT 0.5  PROT 6.1*  ALBUMIN 3.8   No results for input(s): LIPASE, AMYLASE in the last 168 hours. No results for input(s): AMMONIA in the last 168 hours.  Coagulation Profile:  Recent Labs Lab 07/03/16 1549  INR 1.06    Cardiac Enzymes: No results for input(s): CKTOTAL, CKMB, CKMBINDEX, TROPONINI in the last 168 hours.  BNP (last 3 results)  Recent Labs  08/20/15 1442  PROBNP 391.20    HbA1C: No results for input(s): HGBA1C in the last 72 hours.  CBG:  Recent Labs Lab 07/03/16 1726 07/03/16 2209 07/04/16 0810 07/04/16 1305  GLUCAP 173* 88 100* 130*    Lipid Profile:  Recent Labs  07/04/16 0535  CHOL 145  HDL 54  LDLCALC 77  TRIG 71  CHOLHDL 2.7    Thyroid Function Tests:  Recent Labs  07/04/16 0535  TSH 0.195*    Anemia Panel: No results for input(s): VITAMINB12, FOLATE, FERRITIN, TIBC, IRON, RETICCTPCT in the last 72 hours.  Urine analysis:    Component Value Date/Time   COLORURINE STRAW (A) 07/04/2016 0122   APPEARANCEUR CLEAR 07/04/2016 0122   LABSPEC 1.011 07/04/2016 0122   PHURINE 6.0 07/04/2016 0122   GLUCOSEU NEGATIVE 07/04/2016 0122   GLUCOSEU NEGATIVE 06/02/2016 1452   HGBUR SMALL (A) 07/04/2016 0122   BILIRUBINUR NEGATIVE 07/04/2016 0122   KETONESUR NEGATIVE 07/04/2016 0122   PROTEINUR NEGATIVE 07/04/2016 0122   UROBILINOGEN 0.2 06/02/2016 1452   NITRITE NEGATIVE 07/04/2016 0122   LEUKOCYTESUR LARGE (A) 07/04/2016 0122    Sepsis Labs: Lactic Acid, Venous    Component Value Date/Time   LATICACIDVEN 1.40 01/13/2013 1421    MICROBIOLOGY: No results found for this or any previous visit (from the past 240 hour(s)).  RADIOLOGY STUDIES/RESULTS: Ct Angio Head W Or Wo Contrast  Result Date: 07/03/2016 CLINICAL DATA:  Initial evaluation for left-sided visual disturbance. EXAM: CT ANGIOGRAPHY HEAD AND NECK TECHNIQUE:  Multidetector CT imaging of the head and neck was performed using the standard protocol during bolus administration of intravenous contrast. Multiplanar CT image reconstructions and MIPs were obtained to evaluate the vascular anatomy. Carotid stenosis measurements (when applicable) are obtained utilizing NASCET criteria, using the distal internal carotid diameter as the denominator. CONTRAST:  50 cc of Isovue 370. COMPARISON:  Prior CT from earlier same day. FINDINGS: CTA NECK FINDINGS Aortic arch: Aortic arch and origin of the great vessels incompletely evaluated on this exam. Scattered plaque noted within the partially visualized origin about the partially visualized origin of the left subclavian artery without flow-limiting stenosis subclavian arteries distally without significant stenosis. Right carotid system: Right common carotid artery mildly tortuous proximally. Scattered calcified plaque within the right common carotid artery without flow limiting stenosis. Sequela prior endarterectomy present about the right carotid bifurcation.  No residual stenosis. Right ICA widely patent distally to the skullbase without stenosis. Left carotid system: Origin of the left common carotid artery not visualized. Mild a centric plaque about the of left common carotid artery without flow-limiting stenosis. Moderate calcified plaque about the left bifurcation/proximal left ICA. Relatively mild narrowing of approximately 25% by NASCET criteria. Left ICA patent distally without stenosis. Vertebral arteries: Both of the vertebral arteries arise from the subclavian arteries. Minimal plaque within the pre foraminal left V1 segment without stenosis. Left V1 segment tortuous. Mild focal stenosis proximal V2 segments bilaterally related to degenerative disc osteophyte at the level of C5-6. Vertebral arteries otherwise widely patent without stenosis or occlusion. Skeleton: No acute osseous abnormality. No worrisome lytic or blastic  osseous lesions. Multilevel degenerative spondylolysis noted, greatest at C5-6. Multilevel facet arthropathy within the upper cervical spine. Other neck: Soft tissues of the neck demonstrate no acute abnormality. No adenopathy. Salivary glands normal. Thyroid normal. Upper chest: Upper mediastinum within normal limits. Visualized lungs are clear. Review of the MIP images confirms the above findings CTA HEAD FINDINGS Anterior circulation: The petrous segments widely patent bilaterally. Smooth atheromatous plaque throughout the cavernous/supraclinoid ICAs without significant stenosis. ICA termini widely patent. A1 segments patent. Anterior communicating artery normal. Anterior cerebral arteries patent to their distal aspects. M1 segments widely patent without stenosis or occlusion. Distal MCA branches widely patent and symmetric. Posterior circulation: Minimal plaque within the bilateral V4 segments without significant stenosis. Left vertebral artery slightly dominant. Posterior inferior cerebral arteries patent bilaterally. Basilar artery widely patent. Superior cerebral arteries patent bilaterally. Both of the posterior cerebral arteries supplied via the basilar and are widely patent to their distal aspects. Venous sinuses: Patent. Anatomic variants: No aneurysm or vascular malformation. Tiny 2 mm focal outpouching arising from the supraclinoid right ICA favored to reflect a normal vascular infundibulum related to a hypoplastic right P com. No significant anatomic variant. Delayed phase: No pathologic enhancement. Review of the MIP images confirms the above findings IMPRESSION: 1. Negative CTA for emergent large vessel occlusion. 2. Calcified atheromatous plaque at the left carotid bifurcation with mild stenosis of approximately 25% by NASCET criteria. 3. Sequelae of prior right CEA without residual stenosis. 4. Smooth atheromatous plaque throughout the carotid siphons with mild diffuse narrowing. Electronically  Signed   By: Jeannine Boga M.D.   On: 07/03/2016 21:01   Ct Head Wo Contrast  Result Date: 07/03/2016 CLINICAL DATA:  Blurred vision. EXAM: CT HEAD WITHOUT CONTRAST TECHNIQUE: Contiguous axial images were obtained from the base of the skull through the vertex without intravenous contrast. COMPARISON:  CT scan of June 01, 2016. FINDINGS: Brain: Mild chronic ischemic white matter disease is noted. No mass effect or midline shift is noted. Ventricular size is within normal limits. There is no evidence of mass lesion, hemorrhage or acute infarction. Vascular: Atherosclerosis of carotid siphons is noted. Skull: Normal. Negative for fracture or focal lesion. Sinuses/Orbits: No acute finding. Other: None. IMPRESSION: Mild chronic ischemic white matter disease. No acute intracranial abnormality seen. Electronically Signed   By: Marijo Conception, M.D.   On: 07/03/2016 16:36   Ct Angio Neck W And/or Wo Contrast  Result Date: 07/03/2016 CLINICAL DATA:  Initial evaluation for left-sided visual disturbance. EXAM: CT ANGIOGRAPHY HEAD AND NECK TECHNIQUE: Multidetector CT imaging of the head and neck was performed using the standard protocol during bolus administration of intravenous contrast. Multiplanar CT image reconstructions and MIPs were obtained to evaluate the vascular anatomy. Carotid stenosis measurements (when applicable) are  obtained utilizing NASCET criteria, using the distal internal carotid diameter as the denominator. CONTRAST:  50 cc of Isovue 370. COMPARISON:  Prior CT from earlier same day. FINDINGS: CTA NECK FINDINGS Aortic arch: Aortic arch and origin of the great vessels incompletely evaluated on this exam. Scattered plaque noted within the partially visualized origin about the partially visualized origin of the left subclavian artery without flow-limiting stenosis subclavian arteries distally without significant stenosis. Right carotid system: Right common carotid artery mildly tortuous  proximally. Scattered calcified plaque within the right common carotid artery without flow limiting stenosis. Sequela prior endarterectomy present about the right carotid bifurcation. No residual stenosis. Right ICA widely patent distally to the skullbase without stenosis. Left carotid system: Origin of the left common carotid artery not visualized. Mild a centric plaque about the of left common carotid artery without flow-limiting stenosis. Moderate calcified plaque about the left bifurcation/proximal left ICA. Relatively mild narrowing of approximately 25% by NASCET criteria. Left ICA patent distally without stenosis. Vertebral arteries: Both of the vertebral arteries arise from the subclavian arteries. Minimal plaque within the pre foraminal left V1 segment without stenosis. Left V1 segment tortuous. Mild focal stenosis proximal V2 segments bilaterally related to degenerative disc osteophyte at the level of C5-6. Vertebral arteries otherwise widely patent without stenosis or occlusion. Skeleton: No acute osseous abnormality. No worrisome lytic or blastic osseous lesions. Multilevel degenerative spondylolysis noted, greatest at C5-6. Multilevel facet arthropathy within the upper cervical spine. Other neck: Soft tissues of the neck demonstrate no acute abnormality. No adenopathy. Salivary glands normal. Thyroid normal. Upper chest: Upper mediastinum within normal limits. Visualized lungs are clear. Review of the MIP images confirms the above findings CTA HEAD FINDINGS Anterior circulation: The petrous segments widely patent bilaterally. Smooth atheromatous plaque throughout the cavernous/supraclinoid ICAs without significant stenosis. ICA termini widely patent. A1 segments patent. Anterior communicating artery normal. Anterior cerebral arteries patent to their distal aspects. M1 segments widely patent without stenosis or occlusion. Distal MCA branches widely patent and symmetric. Posterior circulation: Minimal  plaque within the bilateral V4 segments without significant stenosis. Left vertebral artery slightly dominant. Posterior inferior cerebral arteries patent bilaterally. Basilar artery widely patent. Superior cerebral arteries patent bilaterally. Both of the posterior cerebral arteries supplied via the basilar and are widely patent to their distal aspects. Venous sinuses: Patent. Anatomic variants: No aneurysm or vascular malformation. Tiny 2 mm focal outpouching arising from the supraclinoid right ICA favored to reflect a normal vascular infundibulum related to a hypoplastic right P com. No significant anatomic variant. Delayed phase: No pathologic enhancement. Review of the MIP images confirms the above findings IMPRESSION: 1. Negative CTA for emergent large vessel occlusion. 2. Calcified atheromatous plaque at the left carotid bifurcation with mild stenosis of approximately 25% by NASCET criteria. 3. Sequelae of prior right CEA without residual stenosis. 4. Smooth atheromatous plaque throughout the carotid siphons with mild diffuse narrowing. Electronically Signed   By: Jeannine Boga M.D.   On: 07/03/2016 21:01     LOS: 0 days   Oren Binet, MD  [facility] Pager:[phone]  If 7PM-7AM, please contact night-coverage [email or web address] Password [email or web address] 07/04/2016, 2:05 PM

## 2016-07-04 NOTE — Progress Notes (Signed)
Kayla Davis to be D/C'd Home per MD order.  Discussed with the patient and all questions fully answered.  VSS, Skin clean, dry and intact without evidence of skin break down, no evidence of skin tears noted. IV catheter discontinued intact. Site without signs and symptoms of complications. Dressing and pressure applied.  An After Visit Summary was printed and given to the patient. Patient received prescription.  D/c education completed with patient/family including follow up instructions, medication list, d/c activities limitations if indicated, with other d/c instructions as indicated by MD - patient able to verbalize understanding, all questions fully answered.   Patient instructed to return to ED, call 911, or call MD for any changes in condition.   Patient escorted via [place], and D/C home via private auto.  Dorris Carnes 07/04/2016 6:25 PM

## 2016-07-04 NOTE — Evaluation (Signed)
Occupational Therapy Evaluation Patient Details Name: Kayla Davis MRN: [id number] DOB: 1922/04/15 Today's Date: 07/04/2016    History of Present Illness Pt is a 83 yo female admitted for a 20-30 minute episode of double vision, flashing lights in the L eye and blurry vision.  CT-.  No MRI due to PPM.  Pt with PMH of TIA, CEA 01/20/14, DM, HTN.   Clinical Impression   Pt admitted with the above diagnosis and overall appears to have returned to baseline. Pt is modified independent with adls at home using adaptive devices, drives, cooks and cleans.  Son lives with her but works during the day.  Pt is safe to return home from OT standpoint.  No further OT needed.    Follow Up Recommendations  No OT follow up;Supervision - Intermittent    Equipment Recommendations  None recommended by OT    Recommendations for Other Services       Precautions / Restrictions Precautions Precautions: None Restrictions Weight Bearing Restrictions: No      Mobility Bed Mobility Overal bed mobility: Independent             General bed mobility comments: Pt appears to be at baseline  Transfers Overall transfer level: Independent Equipment used: Rolling walker (2 wheeled)             General transfer comment: Pt required no outside assist.    Balance Overall balance assessment: Modified Independent                                         ADL either performed or assessed with clinical judgement   ADL Overall ADL's : Modified independent                                       General ADL Comments: Pt appears to be back at baseline for adls.     Vision Baseline Vision/History: Wears glasses Wears Glasses: At all times Patient Visual Report: No change from baseline Vision Assessment?: Yes Eye Alignment: Within Functional Limits Ocular Range of Motion: Within Functional Limits Alignment/Gaze Preference: Within Defined Limits Tracking/Visual  Pursuits: Able to track stimulus in all quads without difficulty Saccades: Within functional limits Convergence: Within functional limits Visual Fields: No apparent deficits     Perception     Praxis      Pertinent Vitals/Pain Pain Assessment: No/denies pain     Hand Dominance Right   Extremity/Trunk Assessment Upper Extremity Assessment Upper Extremity Assessment: Overall WFL for tasks assessed   Lower Extremity Assessment Lower Extremity Assessment: Defer to PT evaluation   Cervical / Trunk Assessment Cervical / Trunk Assessment: Normal   Communication Communication Communication: HOH   Cognition Arousal/Alertness: Awake/alert Behavior During Therapy: WFL for tasks assessed/performed Overall Cognitive Status: Within Functional Limits for tasks assessed                                     General Comments  used walker    Exercises     Shoulder Instructions      Home Living Family/patient expects to be discharged to:: Private residence Living Arrangements: Children Available Help at Discharge: Available PRN/intermittently Type of Home: House Home Access: Stairs to enter [employer] of Steps:  6 Entrance Stairs-Rails: Can reach both;Left;Right Home Layout: One level     Bathroom Shower/Tub: Tub/shower unit;Curtain   Bathroom Toilet: Handicapped height     Home Equipment: Environmental consultant - 2 wheels;Shower seat   Additional Comments: Pt has several walkers      Prior Functioning/Environment Level of Independence: Independent with assistive device(s)        Comments: Pt drives short distances, cooks and cleans.  Son lives with her but works during the day.        OT Problem List:        OT Treatment/Interventions:      OT Goals(Current goals can be found in the care plan section) Acute Rehab OT Goals Patient Stated Goal: to go home today OT Goal Formulation: All assessment and education complete, DC therapy  OT Frequency:      Barriers to D/C:            Co-evaluation              End of Session Equipment Utilized During Treatment: Rolling walker Nurse Communication: Mobility status  Activity Tolerance: Patient tolerated treatment well Patient left: in chair;with call bell/phone within reach;with family/visitor present  OT Visit Diagnosis: Low vision, both eyes (H54.2)                Time: 5208-0223 OT Time Calculation (min): 25 min Charges:  OT General Charges $OT Visit: 1 Procedure OT Evaluation $OT Eval Moderate Complexity: 1 Procedure G-Codes: OT G-codes **NOT FOR INPATIENT CLASS** Functional Assessment Tool Used: Clinical judgement Functional Limitation: Self care Self Care Current Status (V6122): 0 percent impaired, limited or restricted Self Care Goal Status (E4975): 0 percent impaired, limited or restricted Self Care Discharge Status (P0051): 0 percent impaired, limited or restricted   Jinger Neighbors, OTR/L [phone]  Glenford Peers 07/04/2016, 10:12 AM

## 2016-07-04 NOTE — Evaluation (Signed)
Physical Therapy Evaluation/Discharge Patient Details Name: Kayla Davis MRN: [id number] DOB: 24-Mar-1923 Today's Date: 07/04/2016   History of Present Illness  Pt is a 83 yo female admitted for a 20-30 minute episode of double vision, flashing lights in the L eye and blurry vision.  CT-.  No MRI due to PPM.  Pt with PMH of TIA, CEA 01/20/14, DM, HTN.  Clinical Impression  Pt is mod I with all mobility, has no obvious balance deficits and tolerated even stairs well without distress and normal vitals.  She is safe to d/c home when MD deems approrpaite and does not need acute or f/u PT or equipment at this time.  PT to sign off.   Follow Up Recommendations No PT follow up    Equipment Recommendations  None recommended by PT    Recommendations for Other Services   NA    Precautions / Restrictions Precautions Precautions: None Restrictions Weight Bearing Restrictions: No      Mobility  Bed Mobility               General bed mobility comments: Pt is OOB in recliner chair.   Transfers Overall transfer level: Independent Equipment used: Rolling walker (2 wheeled)             General transfer comment: Pt required no outside assist, used RW because we were going a good distance down the hallway.   Ambulation/Gait Ambulation/Gait assistance: Modified independent (Device/Increase time) Ambulation Distance (Feet): 200 Feet Assistive device: Rolling walker (2 wheeled) Gait Pattern/deviations: Step-through pattern;WFL(Within Functional Limits)   Gait velocity interpretation: at or above normal speed for age/gender General Gait Details: good gait speed, safe RW use, no LOB.   Stairs Stairs: Yes Stairs assistance: Modified independent (Device/Increase time) Stair Management: Two rails;Alternating pattern;Step to pattern;Forwards Number of Stairs: 10 General stair comments: alternating up, step to down, good speed, no LOB.  HR and O2 sats normal on RA after doing stairs.           Balance Overall balance assessment: No apparent balance deficits (not formally assessed) (no reports of recent falls)                                           Pertinent Vitals/Pain Pain Assessment: No/denies pain    Home Living Family/patient expects to be discharged to:: Private residence Living Arrangements: Children Available Help at Discharge: Available PRN/intermittently Type of Home: House Home Access: Stairs to enter Entrance Stairs-Rails: Can reach both;Left;Right Entrance Stairs-Number of Steps: 6 Home Layout: One level Home Equipment: Walker - 2 wheels;Shower seat Additional Comments: Pt has several walkers    Prior Function Level of Independence: Independent with assistive device(s)         Comments: Pt drives short distances, cooks and cleans.  Son lives with her but works during the day.     Hand Dominance   Dominant Hand: Right    Extremity/Trunk Assessment   Upper Extremity Assessment Upper Extremity Assessment: Defer to OT evaluation    Lower Extremity Assessment Lower Extremity Assessment: Overall WFL for tasks assessed    Cervical / Trunk Assessment Cervical / Trunk Assessment: Normal  Communication   Communication: HOH  Cognition Arousal/Alertness: Awake/alert Behavior During Therapy: WFL for tasks assessed/performed Overall Cognitive Status: Within Functional Limits for tasks assessed  General Comments General comments (skin integrity, edema, etc.): No reports of dizziness, heart palpitations, DOE.  Pt reports her visual disturbances are now back to normal.      Exercises Other Exercises Other Exercises: Pt demonstrated her upper and LE exercise routine to me that she does daily 5xs/wk at home.  I advised adding a seated and or stanidng march exercise, but otherwise was impressed by her committment to exercising.    Assessment/Plan    PT Assessment  Patent does not need any further PT services         PT Goals (Current goals can be found in the Care Plan section)  Acute Rehab PT Goals Patient Stated Goal: to go home today PT Goal Formulation: All assessment and education complete, DC therapy     End of Session   Activity Tolerance: Patient tolerated treatment well Patient left: in chair;with call bell/phone within reach;with family/visitor present Nurse Communication: Mobility status PT Visit Diagnosis: Difficulty in walking, not elsewhere classified (R26.2)    Time: 4388-8757 PT Time Calculation (min) (ACUTE ONLY): 22 min   Charges:   PT Evaluation $PT Eval Low Complexity: 1 Procedure     PT G Codes:   PT G-Codes **NOT FOR INPATIENT CLASS** Functional Assessment Tool Used: AM-PAC 6 Clicks Basic Mobility Functional Limitation: Mobility: Walking and moving around Mobility: Walking and Moving Around Current Status (V7282): 0 percent impaired, limited or restricted Mobility: Walking and Moving Around Goal Status (S6015): 0 percent impaired, limited or restricted Mobility: Walking and Moving Around Discharge Status (I1537): 0 percent impaired, limited or restricted     Annahi Short [place], [place], DPT [phone]   07/04/2016, 3:28 PM

## 2016-07-04 NOTE — Discharge Summary (Signed)
PATIENT DETAILS Name: Kayla Davis Age: 83 y.o. Sex: female Date of Birth: 28-Aug-1922 MRN: [id number]. Admitting Physician: Orson Eva, MD KWI:OXBD Alain Marion, MD  Admit Date: 07/03/2016 Discharge date: 07/04/2016  Recommendations for Outpatient Follow-up:  1. Follow up with PCP in 1-2 weeks 2. TSH decreased-hence Synthroid dose decreased to 100 mcg-recheck TSH in 3 months 3. Ensure follow up with Neurology 4. Please obtain BMP/CBC in one week 5. A1C pending-please follow  Admitted From:  Home  Disposition: [place]: No  Equipment/Devices: None  Discharge Condition: Stable  CODE STATUS: FULL CODE  Diet recommendation:  Heart Healthy   Brief Summary: See H&P, Labs, Consult and Test reports for all details in brief, Patient is a 82 y.o. female with prior history of TIA status post right CVA 2015, hypertension, dyslipidemia, and diabetes, hypothyroidism admitted with transient onset of left eye blurry vision, diplopia. Thought to have amaurosis fugax, and admitted for further evaluation and treatment. See below for further details  Brief Hospital Course: TIA/amaurosis fugax: No further symptoms, CTA neck there are any significant stenosis, CT head negative for acute abnormalities. Unable to perform MRI brain due to pacemaker. LDL 77, A1c pending.Echo showerd preserved EF, no obvious embolic source. Continue Plavix on discharge as recommended by Neurology. Continue Statin. Note-no focal deficits on exam-no recurrence of visual symptoms since admissio.  Hypertension: Blood pressure relatively stable, resume antihypertensives on discharge.  Hypothyroidism: Continue Synthroid-since TSH is suppressed-Synthroid dosing has been decreased to 100 mcg-recheck TSH in 3 months.   History of symptomatic bradycardia-status post permanent pacemaker  Procedures/Studies: Echo 3/23 >>  -Left ventricle: The cavity size was normal. There was moderate   concentric  hypertrophy. Systolic function was vigorous. The   estimated ejection fraction was in the range of 65% to 70%. Wall   motion was normal; there were no regional wall motion   abnormalities. Doppler parameters are consistent with abnormal   left ventricular relaxation (grade 1 diastolic dysfunction).   Doppler parameters are consistent with elevated ventricular   end-diastolic filling pressure. - Aortic valve: Trileaflet; moderately thickened, moderately   calcified leaflets. There was mild regurgitation. - Aortic root: The aortic root was normal in size. - Mitral valve: The findings are consistent with mild stenosis.   Valve area by continuity equation (using LVOT flow): 1.35 cm^2. - Right ventricle: Pacer wire or catheter noted in right ventricle. - Right atrium: Pacer wire or catheter noted in right atrium. - Pulmonary arteries: Systolic pressure was mildly increased. PA   peak pressure: 35 mm Hg (S). - Inferior vena cava: The vessel was dilated. The respirophasic   diameter changes were blunted (< 50%), consistent with elevated   central venous pressure.  Discharge Diagnoses:  Active Problems:   Essential hypertension   Transient cerebral ischemia   Dyslipidemia   Carotid stenosis   Amaurosis fugax   HLD (hyperlipidemia)   Diet-controlled type 2 diabetes mellitus ([place])   Amaurosis   Discharge Instructions:  Activity:  As tolerated with Full fall precautions use walker/cane & assistance as needed   Discharge Instructions    Ambulatory referral to Neurology    Complete by:  As directed    Dr. Erlinda Hong requests followup in 6 weeks   Ambulatory referral to Neurology    Complete by:  As directed    Diet - low sodium heart healthy    Complete by:  As directed    Discharge instructions    Complete by:  As directed  Follow with Primary MD  [name], MD in 1 week  Please get a complete blood count and chemistry panel checked by your Primary MD at your next visit, and  again as instructed by your Primary MD.  Get Medicines reviewed and adjusted: Please take all your medications with you for your next visit with your Primary MD  Laboratory/radiological data: Please request your Primary MD to go over all hospital tests and procedure/radiological results at the follow up, please ask your Primary MD to get all Hospital records sent to his/her office.  In some cases, they will be blood work, cultures and biopsy results pending at the time of your discharge. Please request that your primary care M.D. follows up on these results.  Also Note the following: If you experience worsening of your admission symptoms, develop shortness of breath, life threatening emergency, suicidal or homicidal thoughts you must seek medical attention immediately by calling 911 or calling your MD immediately  if symptoms less severe.  You must read complete instructions/literature along with all the possible adverse reactions/side effects for all the Medicines you take and that have been prescribed to you. Take any new Medicines after you have completely understood and accpet all the possible adverse reactions/side effects.   Do not drive when taking Pain medications or sleeping medications (Benzodaizepines)  Do not take more than prescribed Pain, Sleep and Anxiety Medications. It is not advisable to combine anxiety,sleep and pain medications without talking with your primary care practitioner  Special Instructions: If you have smoked or chewed Tobacco  in the last 2 yrs please stop smoking, stop any regular Alcohol  and or any Recreational drug use.  Wear Seat belts while driving.  Please note: You were cared for by a hospitalist during your hospital stay. Once you are discharged, your primary care physician will handle any further medical issues. Please note that NO REFILLS for any discharge medications will be authorized once you are discharged, as it is imperative that you return to  your primary care physician (or establish a relationship with a primary care physician if you do not have one) for your post hospital discharge needs so that they can reassess your need for medications and monitor your lab values.   Increase activity slowly    Complete by:  As directed      Allergies as of 07/04/2016      Reactions   Penicillins    REACTION: swells up all over body, pt states she almost died Has patient had a PCN reaction causing immediate rash, facial/tongue/throat swelling, SOB or lightheadedness with hypotension: Yes Has patient had a PCN reaction causing severe rash involving mucus membranes or skin necrosis: known Has patient had a PCN reaction that required hospitalization Yes Has patient had a PCN reaction occurring within the last 10 years: No If all of the above answers are "NO", then may proceed with Cephalosporin u   Alprazolam    REACTION: eyes swell up   Bactrim [sulfamethoxazole-trimethoprim]    Unable to remember the side effects   Ceclor [cefaclor]    Unsure of the side effects   Cephalexin    Unsure of the side effects   Ciprofloxacin Itching, Swelling   Pt states that she tolerated po cipro well, but not the higher dose given IV.   Clindamycin/lincomycin    Upset stomach   Hydrocodone    Unsure of reaction to med   Noroxin [norfloxacin]    Unsure of side effects   Sulfonamide  Derivatives    REACTION: itching   Tetracyclines & Related    Unsure of the reaction to this medication   Tramadol    Did not like how she felt      Medication List    STOP taking these medications   aspirin EC 81 MG tablet     TAKE these medications   ALPHA LIPOIC ACID PO Take 1 tablet by mouth daily.   clopidogrel 75 MG tablet Commonly known as:  PLAVIX Take 1 tablet (75 mg total) by mouth daily. Start taking on:  07/05/2016   CRANBERRY PO Take 1 tablet by mouth daily with supper.   EVENING PRIMROSE OIL PO Take 1 capsule by mouth daily.   EYE VITAMINS  Caps Take 1 capsule by mouth daily.   FLONASE ALLERGY RELIEF 50 MCG/ACT nasal spray Generic drug:  fluticasone Place 1 spray into both nostrils daily.   ipratropium 0.06 % nasal spray Commonly known as:  ATROVENT Place 2 sprays into both nostrils 3 (three) times daily.   levothyroxine 100 MCG tablet Commonly known as:  SYNTHROID, LEVOTHROID Take 1 tablet (100 mcg total) by mouth daily before breakfast. What changed:  See the new instructions.   loratadine 10 MG tablet Commonly known as:  CLARITIN Take 10 mg by mouth daily.   losartan 25 MG tablet Commonly known as:  COZAAR Take 1 tablet (25 mg total) by mouth 2 (two) times daily. What changed:  when to take this  reasons to take this   LYRICA 75 MG capsule Generic drug:  pregabalin Take 75 mg by mouth 2 (two) times daily.   meclizine 12.5 MG tablet Commonly known as:  ANTIVERT Take 1 tablet (12.5 mg total) by mouth 3 (three) times daily as needed for dizziness.   metoprolol tartrate 25 MG tablet Commonly known as:  LOPRESSOR Take 1 tablet (25 mg total) by mouth 2 (two) times daily.   multivitamin with minerals Tabs tablet Take 1 tablet by mouth daily with supper.   ONE TOUCH ULTRA TEST test strip Generic drug:  glucose blood TEST ONCE DAILY AS DIRECTED   onetouch ultrasoft lancets Use to check blood sugars once a day Dx E11.9   REFRESH OP Place 1 drop into both eyes 2 (two) times daily.   simvastatin 40 MG tablet Commonly known as:  ZOCOR Take 1 tablet (40 mg total) by mouth daily with supper.   STOOL SOFTENER PO Take 2 capsules by mouth at bedtime.   VITAMIN B-12 PO Take 1 tablet by mouth at bedtime.      Follow-up Information    Xu,Jindong, MD Follow up in 6 week(s).   Specialty:  Neurology Why:  stroke clinic. office will call with appt date and time Contact information: [address] [place] [id number] [place], MD. Schedule an appointment as  soon as possible for a visit in 1 week(s).   Specialty:  Internal Medicine Contact information: [place] [address] [phone]          Allergies  Allergen Reactions  . Penicillins     REACTION: swells up all over body, pt states she almost died Has patient had a PCN reaction causing immediate rash, facial/tongue/throat swelling, SOB or lightheadedness with hypotension: Yes Has patient had a PCN reaction causing severe rash involving mucus membranes or skin necrosis: known Has patient had a PCN reaction that required hospitalization Yes Has patient had a PCN  reaction occurring within the last 10 years: No If all of the above answers are "NO", then may proceed with Cephalosporin u  . Alprazolam      REACTION: eyes swell up  . Bactrim [Sulfamethoxazole-Trimethoprim]     Unable to remember the side effects  . Ceclor [Cefaclor]     Unsure of the side effects  . Cephalexin     Unsure of the side effects  . Ciprofloxacin Itching and Swelling    Pt states that she tolerated po cipro well, but not the higher dose given IV.  [name] Clindamycin/Lincomycin     Upset stomach  . Hydrocodone     Unsure of reaction to med  . Noroxin [Norfloxacin]     Unsure of side effects  . Sulfonamide Derivatives     REACTION: itching  . Tetracyclines & Related     Unsure of the reaction to this medication  . Tramadol     Did not like how she felt    Consultations:   neurology  Other Procedures/Studies: Ct Angio Head W Or Wo Contrast  Result Date: 07/03/2016 CLINICAL DATA:  Initial evaluation for left-sided visual disturbance. EXAM: CT ANGIOGRAPHY HEAD AND NECK TECHNIQUE: Multidetector CT imaging of the head and neck was performed using the standard protocol during bolus administration of intravenous contrast. Multiplanar CT image reconstructions and MIPs were obtained to evaluate the vascular anatomy. Carotid stenosis measurements (when applicable) are obtained utilizing NASCET  criteria, using the distal internal carotid diameter as the denominator. CONTRAST:  50 cc of Isovue 370. COMPARISON:  Prior CT from earlier same day. FINDINGS: CTA NECK FINDINGS Aortic arch: Aortic arch and origin of the great vessels incompletely evaluated on this exam. Scattered plaque noted within the partially visualized origin about the partially visualized origin of the left subclavian artery without flow-limiting stenosis subclavian arteries distally without significant stenosis. Right carotid system: Right common carotid artery mildly tortuous proximally. Scattered calcified plaque within the right common carotid artery without flow limiting stenosis. Sequela prior endarterectomy present about the right carotid bifurcation. No residual stenosis. Right ICA widely patent distally to the skullbase without stenosis. Left carotid system: Origin of the left common carotid artery not visualized. Mild a centric plaque about the of left common carotid artery without flow-limiting stenosis. Moderate calcified plaque about the left bifurcation/proximal left ICA. Relatively mild narrowing of approximately 25% by NASCET criteria. Left ICA patent distally without stenosis. Vertebral arteries: Both of the vertebral arteries arise from the subclavian arteries. Minimal plaque within the pre foraminal left V1 segment without stenosis. Left V1 segment tortuous. Mild focal stenosis proximal V2 segments bilaterally related to degenerative disc osteophyte at the level of C5-6. Vertebral arteries otherwise widely patent without stenosis or occlusion. Skeleton: No acute osseous abnormality. No worrisome lytic or blastic osseous lesions. Multilevel degenerative spondylolysis noted, greatest at C5-6. Multilevel facet arthropathy within the upper cervical spine. Other neck: Soft tissues of the neck demonstrate no acute abnormality. No adenopathy. Salivary glands normal. Thyroid normal. Upper chest: Upper mediastinum within normal  limits. Visualized lungs are clear. Review of the MIP images confirms the above findings CTA HEAD FINDINGS Anterior circulation: The petrous segments widely patent bilaterally. Smooth atheromatous plaque throughout the cavernous/supraclinoid ICAs without significant stenosis. ICA termini widely patent. A1 segments patent. Anterior communicating artery normal. Anterior cerebral arteries patent to their distal aspects. M1 segments widely patent without stenosis or occlusion. Distal MCA branches widely patent and symmetric. Posterior circulation: Minimal plaque within the bilateral V4 segments without significant  stenosis. Left vertebral artery slightly dominant. Posterior inferior cerebral arteries patent bilaterally. Basilar artery widely patent. Superior cerebral arteries patent bilaterally. Both of the posterior cerebral arteries supplied via the basilar and are widely patent to their distal aspects. Venous sinuses: Patent. Anatomic variants: No aneurysm or vascular malformation. Tiny 2 mm focal outpouching arising from the supraclinoid right ICA favored to reflect a normal vascular infundibulum related to a hypoplastic right P com. No significant anatomic variant. Delayed phase: No pathologic enhancement. Review of the MIP images confirms the above findings IMPRESSION: 1. Negative CTA for emergent large vessel occlusion. 2. Calcified atheromatous plaque at the left carotid bifurcation with mild stenosis of approximately 25% by NASCET criteria. 3. Sequelae of prior right CEA without residual stenosis. 4. Smooth atheromatous plaque throughout the carotid siphons with mild diffuse narrowing. Electronically Signed   By: Jeannine Boga M.D.   On: 07/03/2016 21:01   Ct Head Wo Contrast  Result Date: 07/03/2016 CLINICAL DATA:  Blurred vision. EXAM: CT HEAD WITHOUT CONTRAST TECHNIQUE: Contiguous axial images were obtained from the base of the skull through the vertex without intravenous contrast. COMPARISON:  CT  scan of June 01, 2016. FINDINGS: Brain: Mild chronic ischemic white matter disease is noted. No mass effect or midline shift is noted. Ventricular size is within normal limits. There is no evidence of mass lesion, hemorrhage or acute infarction. Vascular: Atherosclerosis of carotid siphons is noted. Skull: Normal. Negative for fracture or focal lesion. Sinuses/Orbits: No acute finding. Other: None. IMPRESSION: Mild chronic ischemic white matter disease. No acute intracranial abnormality seen. Electronically Signed   By: Marijo Conception, M.D.   On: 07/03/2016 16:36   Ct Angio Neck W And/or Wo Contrast  Result Date: 07/03/2016 CLINICAL DATA:  Initial evaluation for left-sided visual disturbance. EXAM: CT ANGIOGRAPHY HEAD AND NECK TECHNIQUE: Multidetector CT imaging of the head and neck was performed using the standard protocol during bolus administration of intravenous contrast. Multiplanar CT image reconstructions and MIPs were obtained to evaluate the vascular anatomy. Carotid stenosis measurements (when applicable) are obtained utilizing NASCET criteria, using the distal internal carotid diameter as the denominator. CONTRAST:  50 cc of Isovue 370. COMPARISON:  Prior CT from earlier same day. FINDINGS: CTA NECK FINDINGS Aortic arch: Aortic arch and origin of the great vessels incompletely evaluated on this exam. Scattered plaque noted within the partially visualized origin about the partially visualized origin of the left subclavian artery without flow-limiting stenosis subclavian arteries distally without significant stenosis. Right carotid system: Right common carotid artery mildly tortuous proximally. Scattered calcified plaque within the right common carotid artery without flow limiting stenosis. Sequela prior endarterectomy present about the right carotid bifurcation. No residual stenosis. Right ICA widely patent distally to the skullbase without stenosis. Left carotid system: Origin of the left common  carotid artery not visualized. Mild a centric plaque about the of left common carotid artery without flow-limiting stenosis. Moderate calcified plaque about the left bifurcation/proximal left ICA. Relatively mild narrowing of approximately 25% by NASCET criteria. Left ICA patent distally without stenosis. Vertebral arteries: Both of the vertebral arteries arise from the subclavian arteries. Minimal plaque within the pre foraminal left V1 segment without stenosis. Left V1 segment tortuous. Mild focal stenosis proximal V2 segments bilaterally related to degenerative disc osteophyte at the level of C5-6. Vertebral arteries otherwise widely patent without stenosis or occlusion. Skeleton: No acute osseous abnormality. No worrisome lytic or blastic osseous lesions. Multilevel degenerative spondylolysis noted, greatest at C5-6. Multilevel facet arthropathy within the upper  cervical spine. Other neck: Soft tissues of the neck demonstrate no acute abnormality. No adenopathy. Salivary glands normal. Thyroid normal. Upper chest: Upper mediastinum within normal limits. Visualized lungs are clear. Review of the MIP images confirms the above findings CTA HEAD FINDINGS Anterior circulation: The petrous segments widely patent bilaterally. Smooth atheromatous plaque throughout the cavernous/supraclinoid ICAs without significant stenosis. ICA termini widely patent. A1 segments patent. Anterior communicating artery normal. Anterior cerebral arteries patent to their distal aspects. M1 segments widely patent without stenosis or occlusion. Distal MCA branches widely patent and symmetric. Posterior circulation: Minimal plaque within the bilateral V4 segments without significant stenosis. Left vertebral artery slightly dominant. Posterior inferior cerebral arteries patent bilaterally. Basilar artery widely patent. Superior cerebral arteries patent bilaterally. Both of the posterior cerebral arteries supplied via the basilar and are widely  patent to their distal aspects. Venous sinuses: Patent. Anatomic variants: No aneurysm or vascular malformation. Tiny 2 mm focal outpouching arising from the supraclinoid right ICA favored to reflect a normal vascular infundibulum related to a hypoplastic right P com. No significant anatomic variant. Delayed phase: No pathologic enhancement. Review of the MIP images confirms the above findings IMPRESSION: 1. Negative CTA for emergent large vessel occlusion. 2. Calcified atheromatous plaque at the left carotid bifurcation with mild stenosis of approximately 25% by NASCET criteria. 3. Sequelae of prior right CEA without residual stenosis. 4. Smooth atheromatous plaque throughout the carotid siphons with mild diffuse narrowing. Electronically Signed   By: Jeannine Boga M.D.   On: 07/03/2016 21:01      TODAY-DAY OF DISCHARGE:  Subjective:   Kayla Davis today has no headache,no chest abdominal pain,no new weakness tingling or numbness, feels much better wants to go home today.   Objective:   Blood pressure (!) 143/57, pulse 70, temperature 98.3 F (36.8 C), temperature source Oral, resp. rate 18, height 5\' 5"  (1.651 m), weight 67.1 kg (148 lb), SpO2 96 %.  Intake/Output Summary (Last 24 hours) at 07/04/16 1703 Last data filed at 07/04/16 0945  Gross per 24 hour  Intake              480 ml  Output              300 ml  Net              180 ml   Filed Weights   07/03/16 1537  Weight: 67.1 kg (148 lb)    Exam: Awake Alert, Oriented *3, No new F.N deficits, Normal affect [place].AT,PERRAL Supple Neck,No JVD, No cervical lymphadenopathy appriciated.  Symmetrical Chest wall movement, Good air movement bilaterally, CTAB RRR,No Gallops,Rubs or new Murmurs, No Parasternal Heave +ve B.Sounds, Abd Soft, Non tender, No organomegaly appriciated, No rebound -guarding or rigidity. No Cyanosis, Clubbing or edema, No new Rash or bruise   PERTINENT RADIOLOGIC STUDIES: Ct Angio Head W Or Wo  Contrast  Result Date: 07/03/2016 CLINICAL DATA:  Initial evaluation for left-sided visual disturbance. EXAM: CT ANGIOGRAPHY HEAD AND NECK TECHNIQUE: Multidetector CT imaging of the head and neck was performed using the standard protocol during bolus administration of intravenous contrast. Multiplanar CT image reconstructions and MIPs were obtained to evaluate the vascular anatomy. Carotid stenosis measurements (when applicable) are obtained utilizing NASCET criteria, using the distal internal carotid diameter as the denominator. CONTRAST:  50 cc of Isovue 370. COMPARISON:  Prior CT from earlier same day. FINDINGS: CTA NECK FINDINGS Aortic arch: Aortic arch and origin of the great vessels incompletely evaluated on this exam. Scattered plaque  noted within the partially visualized origin about the partially visualized origin of the left subclavian artery without flow-limiting stenosis subclavian arteries distally without significant stenosis. Right carotid system: Right common carotid artery mildly tortuous proximally. Scattered calcified plaque within the right common carotid artery without flow limiting stenosis. Sequela prior endarterectomy present about the right carotid bifurcation. No residual stenosis. Right ICA widely patent distally to the skullbase without stenosis. Left carotid system: Origin of the left common carotid artery not visualized. Mild a centric plaque about the of left common carotid artery without flow-limiting stenosis. Moderate calcified plaque about the left bifurcation/proximal left ICA. Relatively mild narrowing of approximately 25% by NASCET criteria. Left ICA patent distally without stenosis. Vertebral arteries: Both of the vertebral arteries arise from the subclavian arteries. Minimal plaque within the pre foraminal left V1 segment without stenosis. Left V1 segment tortuous. Mild focal stenosis proximal V2 segments bilaterally related to degenerative disc osteophyte at the level of  C5-6. Vertebral arteries otherwise widely patent without stenosis or occlusion. Skeleton: No acute osseous abnormality. No worrisome lytic or blastic osseous lesions. Multilevel degenerative spondylolysis noted, greatest at C5-6. Multilevel facet arthropathy within the upper cervical spine. Other neck: Soft tissues of the neck demonstrate no acute abnormality. No adenopathy. Salivary glands normal. Thyroid normal. Upper chest: Upper mediastinum within normal limits. Visualized lungs are clear. Review of the MIP images confirms the above findings CTA HEAD FINDINGS Anterior circulation: The petrous segments widely patent bilaterally. Smooth atheromatous plaque throughout the cavernous/supraclinoid ICAs without significant stenosis. ICA termini widely patent. A1 segments patent. Anterior communicating artery normal. Anterior cerebral arteries patent to their distal aspects. M1 segments widely patent without stenosis or occlusion. Distal MCA branches widely patent and symmetric. Posterior circulation: Minimal plaque within the bilateral V4 segments without significant stenosis. Left vertebral artery slightly dominant. Posterior inferior cerebral arteries patent bilaterally. Basilar artery widely patent. Superior cerebral arteries patent bilaterally. Both of the posterior cerebral arteries supplied via the basilar and are widely patent to their distal aspects. Venous sinuses: Patent. Anatomic variants: No aneurysm or vascular malformation. Tiny 2 mm focal outpouching arising from the supraclinoid right ICA favored to reflect a normal vascular infundibulum related to a hypoplastic right P com. No significant anatomic variant. Delayed phase: No pathologic enhancement. Review of the MIP images confirms the above findings IMPRESSION: 1. Negative CTA for emergent large vessel occlusion. 2. Calcified atheromatous plaque at the left carotid bifurcation with mild stenosis of approximately 25% by NASCET criteria. 3. Sequelae of  prior right CEA without residual stenosis. 4. Smooth atheromatous plaque throughout the carotid siphons with mild diffuse narrowing. Electronically Signed   By: Jeannine Boga M.D.   On: 07/03/2016 21:01   Ct Head Wo Contrast  Result Date: 07/03/2016 CLINICAL DATA:  Blurred vision. EXAM: CT HEAD WITHOUT CONTRAST TECHNIQUE: Contiguous axial images were obtained from the base of the skull through the vertex without intravenous contrast. COMPARISON:  CT scan of June 01, 2016. FINDINGS: Brain: Mild chronic ischemic white matter disease is noted. No mass effect or midline shift is noted. Ventricular size is within normal limits. There is no evidence of mass lesion, hemorrhage or acute infarction. Vascular: Atherosclerosis of carotid siphons is noted. Skull: Normal. Negative for fracture or focal lesion. Sinuses/Orbits: No acute finding. Other: None. IMPRESSION: Mild chronic ischemic white matter disease. No acute intracranial abnormality seen. Electronically Signed   By: Marijo Conception, M.D.   On: 07/03/2016 16:36   Ct Angio Neck W And/or Wo Contrast  Result  Date: 07/03/2016 CLINICAL DATA:  Initial evaluation for left-sided visual disturbance. EXAM: CT ANGIOGRAPHY HEAD AND NECK TECHNIQUE: Multidetector CT imaging of the head and neck was performed using the standard protocol during bolus administration of intravenous contrast. Multiplanar CT image reconstructions and MIPs were obtained to evaluate the vascular anatomy. Carotid stenosis measurements (when applicable) are obtained utilizing NASCET criteria, using the distal internal carotid diameter as the denominator. CONTRAST:  50 cc of Isovue 370. COMPARISON:  Prior CT from earlier same day. FINDINGS: CTA NECK FINDINGS Aortic arch: Aortic arch and origin of the great vessels incompletely evaluated on this exam. Scattered plaque noted within the partially visualized origin about the partially visualized origin of the left subclavian artery without  flow-limiting stenosis subclavian arteries distally without significant stenosis. Right carotid system: Right common carotid artery mildly tortuous proximally. Scattered calcified plaque within the right common carotid artery without flow limiting stenosis. Sequela prior endarterectomy present about the right carotid bifurcation. No residual stenosis. Right ICA widely patent distally to the skullbase without stenosis. Left carotid system: Origin of the left common carotid artery not visualized. Mild a centric plaque about the of left common carotid artery without flow-limiting stenosis. Moderate calcified plaque about the left bifurcation/proximal left ICA. Relatively mild narrowing of approximately 25% by NASCET criteria. Left ICA patent distally without stenosis. Vertebral arteries: Both of the vertebral arteries arise from the subclavian arteries. Minimal plaque within the pre foraminal left V1 segment without stenosis. Left V1 segment tortuous. Mild focal stenosis proximal V2 segments bilaterally related to degenerative disc osteophyte at the level of C5-6. Vertebral arteries otherwise widely patent without stenosis or occlusion. Skeleton: No acute osseous abnormality. No worrisome lytic or blastic osseous lesions. Multilevel degenerative spondylolysis noted, greatest at C5-6. Multilevel facet arthropathy within the upper cervical spine. Other neck: Soft tissues of the neck demonstrate no acute abnormality. No adenopathy. Salivary glands normal. Thyroid normal. Upper chest: Upper mediastinum within normal limits. Visualized lungs are clear. Review of the MIP images confirms the above findings CTA HEAD FINDINGS Anterior circulation: The petrous segments widely patent bilaterally. Smooth atheromatous plaque throughout the cavernous/supraclinoid ICAs without significant stenosis. ICA termini widely patent. A1 segments patent. Anterior communicating artery normal. Anterior cerebral arteries patent to their distal  aspects. M1 segments widely patent without stenosis or occlusion. Distal MCA branches widely patent and symmetric. Posterior circulation: Minimal plaque within the bilateral V4 segments without significant stenosis. Left vertebral artery slightly dominant. Posterior inferior cerebral arteries patent bilaterally. Basilar artery widely patent. Superior cerebral arteries patent bilaterally. Both of the posterior cerebral arteries supplied via the basilar and are widely patent to their distal aspects. Venous sinuses: Patent. Anatomic variants: No aneurysm or vascular malformation. Tiny 2 mm focal outpouching arising from the supraclinoid right ICA favored to reflect a normal vascular infundibulum related to a hypoplastic right P com. No significant anatomic variant. Delayed phase: No pathologic enhancement. Review of the MIP images confirms the above findings IMPRESSION: 1. Negative CTA for emergent large vessel occlusion. 2. Calcified atheromatous plaque at the left carotid bifurcation with mild stenosis of approximately 25% by NASCET criteria. 3. Sequelae of prior right CEA without residual stenosis. 4. Smooth atheromatous plaque throughout the carotid siphons with mild diffuse narrowing. Electronically Signed   By: Jeannine Boga M.D.   On: 07/03/2016 21:01     PERTINENT LAB RESULTS: CBC:  Recent Labs  07/03/16 1549 07/03/16 1618  WBC 6.7  --   HGB 11.2* 11.2*  HCT 34.8* 33.0*  PLT 205  --  CMET CMP     Component Value Date/Time   NA 136 07/03/2016 1618   K 4.3 07/03/2016 1618   CL 101 07/03/2016 1618   CO2 26 07/03/2016 1549   GLUCOSE 191 (H) 07/03/2016 1618   BUN 29 (H) 07/03/2016 1618   CREATININE 0.90 07/03/2016 1618   CALCIUM 9.1 07/03/2016 1549   PROT 6.1 (L) 07/03/2016 1549   ALBUMIN 3.8 07/03/2016 1549   AST 24 07/03/2016 1549   ALT 24 07/03/2016 1549   ALKPHOS 76 07/03/2016 1549   BILITOT 0.5 07/03/2016 1549   GFRNONAA 50 (L) 07/03/2016 1549   GFRAA 57 (L)  07/03/2016 1549    GFR Estimated Creatinine Clearance: 36.6 mL/min (by C-G formula based on SCr of 0.9 mg/dL). No results for input(s): LIPASE, AMYLASE in the last 72 hours. No results for input(s): CKTOTAL, CKMB, CKMBINDEX, TROPONINI in the last 72 hours. Invalid input(s): POCBNP No results for input(s): DDIMER in the last 72 hours. No results for input(s): HGBA1C in the last 72 hours.  Recent Labs  07/04/16 0535  CHOL 145  HDL 54  LDLCALC 77  TRIG 71  CHOLHDL 2.7    Recent Labs  07/04/16 0535  TSH 0.195*   No results for input(s): VITAMINB12, FOLATE, FERRITIN, TIBC, IRON, RETICCTPCT in the last 72 hours. Coags:  Recent Labs  07/03/16 1549  INR 1.06   Microbiology: No results found for this or any previous visit (from the past 240 hour(s)).  FURTHER DISCHARGE INSTRUCTIONS:  Get Medicines reviewed and adjusted: Please take all your medications with you for your next visit with your Primary MD  Laboratory/radiological data: Please request your Primary MD to go over all hospital tests and procedure/radiological results at the follow up, please ask your Primary MD to get all Hospital records sent to his/her office.  In some cases, they will be blood work, cultures and biopsy results pending at the time of your discharge. Please request that your primary care M.D. goes through all the records of your hospital data and follows up on these results.  Also Note the following: If you experience worsening of your admission symptoms, develop shortness of breath, life threatening emergency, suicidal or homicidal thoughts you must seek medical attention immediately by calling 911 or calling your MD immediately  if symptoms less severe.  You must read complete instructions/literature along with all the possible adverse reactions/side effects for all the Medicines you take and that have been prescribed to you. Take any new Medicines after you have completely understood and accpet  all the possible adverse reactions/side effects.   Do not drive when taking Pain medications or sleeping medications (Benzodaizepines)  Do not take more than prescribed Pain, Sleep and Anxiety Medications. It is not advisable to combine anxiety,sleep and pain medications without talking with your primary care practitioner  Special Instructions: If you have smoked or chewed Tobacco  in the last 2 yrs please stop smoking, stop any regular Alcohol  and or any Recreational drug use.  Wear Seat belts while driving.  Please note: You were cared for by a hospitalist during your hospital stay. Once you are discharged, your primary care physician will handle any further medical issues. Please note that NO REFILLS for any discharge medications will be authorized once you are discharged, as it is imperative that you return to your primary care physician (or establish a relationship with a primary care physician if you do not have one) for your post hospital discharge needs so that they  can reassess your need for medications and monitor your lab values.  Total Time spent coordinating discharge including counseling, education and face to face time equals 35 minutes.  SignedOren Binet 07/04/2016 5:03 PM

## 2016-07-04 NOTE — Progress Notes (Signed)
STROKE TEAM PROGRESS NOTE   SUBJECTIVE (INTERVAL HISTORY) Her daughter is at the bedside.  Patient up in the chair eating breakfast. She recounted HPI with Dr. Leonie Man. States she was cooking lunch when L eye double vision started along with flashing lights. Only eye hx I cataract and lasik surgery. Reports L ear pain recently. Lives alone but kids nearby.   OBJECTIVE Temp:  [97.3 F (36.3 C)-98.3 F (36.8 C)] 98.1 F (36.7 C) (03/23 0607) Pulse Rate:  [59-61] 61 (03/23 0607) Cardiac Rhythm: Atrial paced (03/22 2204) Resp:  [16-20] 16 (03/23 0607) BP: (121-158)/(50-68) 121/55 (03/23 0607) SpO2:  [96 %-99 %] 96 % (03/23 0607) Weight:  [67.1 kg (148 lb)] 67.1 kg (148 lb) (03/22 1537)  CBC:  Recent Labs Lab 07/03/16 1549 07/03/16 1618  WBC 6.7  --   NEUTROABS 3.8  --   HGB 11.2* 11.2*  HCT 34.8* 33.0*  MCV 94.3  --   PLT 205  --     Basic Metabolic Panel:  Recent Labs Lab 07/03/16 1549 07/03/16 1618  NA 135 136  K 4.8 4.3  CL 102 101  CO2 26  --   GLUCOSE 194* 191*  BUN 27* 29*  CREATININE 0.97 0.90  CALCIUM 9.1  --     Lipid Panel:    Component Value Date/Time   CHOL 145 07/04/2016 0535   TRIG 71 07/04/2016 0535   HDL 54 07/04/2016 0535   CHOLHDL 2.7 07/04/2016 0535   VLDL 14 07/04/2016 0535   LDLCALC 77 07/04/2016 0535   HgbA1c:  Lab Results  Component Value Date   HGBA1C 6.3 02/26/2016   Urine Drug Screen:    Component Value Date/Time   LABOPIA NEG 11/16/2013 1204   COCAINSCRNUR NEG 11/16/2013 1204   LABBENZ NEG 11/16/2013 1204   AMPHETMU NEG 11/16/2013 1204     PHYSICAL EXAM Pleasant elderly caucasian lady not in distress. . Afebrile. Head is nontraumatic. Neck is supple without bruit.    Cardiac exam no murmur or gallop. Lungs are clear to auscultation. Distal pulses are well felt. Neurological Exam ;  Awake  Alert oriented x 3. Normal speech and language.eye movements full without nystagmus.fundi were not visualized. Vision acuity and fields  appear normal. Hearing is normal. Palatal movements are normal. Face symmetric. Tongue midline. Normal strength, tone, reflexes and coordination. Normal sensation. Gait deferred.  ASSESSMENT/PLAN Kayla Davis is a 83 y.o. female with history of TIA d/t carotid stenosis s/p right CEA on 01/20/2014, hypertension, hyperlipidemia, diabetes mellitus, hypothyroidism  presenting with left eye flashing lights and blurry vision. She did not receive IV t-PA.   Possible TIA vs Stroke  CT head no acute abnormality  CTA head and neck no ELVO. L ICA plaque with 25% stenosis, s/p R CEA w/o stenosis, smooth atherosclerosis B ICAs  MRI not done due to pacer  2D Echo  pending   LDL 77  HgbA1c pending  Lovenox 40 mg sq daily for VTE prophylaxis  Diet Carb Modified Fluid consistency: Thin; Room service appropriate? Yes  aspirin 81 mg daily prior to admission, now on clopidogrel 75 mg daily. Continue plavix at discharge  Therapy recommendations:  No OT  Disposition:  pending   Complete workup , then ok for discharge from stroke standpoint  Followed by Dr. Erlinda Hong as an OP. Continue follow up with him  Hypertension  Stable  Hyperlipidemia  Home meds:  zocor 40, resumed in hospital  LDL 77 just above goal  Continue statin at  discharge  Diabetes type II  HgbA1c goal < 7.0   Other Stroke Risk Factors  Advanced age  Hx TIAs   01/2014 Blurred vision, etiology likely symptomatic proximal RICA stenosis with R CEA  PVD s/p R CEA 01/2014  Other Active Problems  Symptomatic bradycardia  Hypothyroidism   Hospital day # 0  Radene Journey [facility] for Pager information 07/04/2016 10:44 AM  I have personally examined this patient, reviewed notes, independently viewed imaging studies, participated in medical decision making and plan of care.ROS completed by me personally and pertinent positives fully documented  I have made any additions or clarifications  directly to the above note. Agree with note above. She reports transient diplopia not sure if monocular or binocular and without any associated focal symptoms making diagnosis of TIA versus something else unclear. Recommend change aspirin to Plavix for stroke prevention. Greater than 50% time during this 35 negative visit was spent on counseling and coordination of care about her diplopia and stroke and TIA risk and discussion of prevention and treatment options  Antony Contras, MD Medical Director [place] Pager: [phone] 07/04/2016 4:41 PM  To contact Stroke Continuity provider, please refer to [email or web address] After hours, contact General Neurology

## 2016-07-05 LAB — HEMOGLOBIN A1C
Hgb A1c MFr Bld: 6.2 % — ABNORMAL HIGH (ref 4.8–5.6)
MEAN PLASMA GLUCOSE: 131 mg/dL

## 2016-07-06 LAB — URINE CULTURE: Culture: >=100000 — AB

## 2016-07-07 NOTE — Progress Notes (Signed)
SLP addendum    07/04/16 0828  SLP G-Codes **NOT FOR INPATIENT CLASS**  Functional Assessment Tool Used skilled clinical judgement  Functional Limitations Spoken language expressive  Spoken Language Expression Current Status (E3154) [place]  Spoken Language Expression Goal Status (M0867) [place]  Spoken Language Expression Discharge Status (Y1950) [place]  SLP Evaluations  $ SLP Speech Visit 1 Procedure  SLP Evaluations  $BSS Swallow 1 Procedure   Orbie Pyo [place] M.Ed [employer] [phone]

## 2016-07-10 ENCOUNTER — Telehealth: Payer: Self-pay | Admitting: Diagnostic Neuroimaging

## 2016-07-10 ENCOUNTER — Ambulatory Visit: Payer: MEDICARE | Admitting: Nurse Practitioner

## 2016-07-10 NOTE — Telephone Encounter (Signed)
Pt saw Dr Erlinda Hong in the hospital but he would not have an appointment avail until July. Pt has been on clopidogrel (PLAVIX) 75 MG tablet since in the hospital, she said that it has her weak and she is having diarrhea, she would like to know if maybe the dosage could be cut down

## 2016-07-10 NOTE — Telephone Encounter (Signed)
Thank you, Stanton Kidney, for the message. Dr. Leonie Man saw her recently in hospital and started plavix for her. If she is not tolerating plavix, I recommend her to go back to full dose ASA enteral coated tablets daily. Decrease the plavix dose does not work well. Thank you. Will see her in July.   Rosalin Hawking, MD PhD Stroke Neurology 07/10/2016 3:37 PM

## 2016-07-10 NOTE — Telephone Encounter (Signed)
Per Dr Erlinda Hong, spoke with patient and advised her Dr Erlinda Hong wants her to stop Plavix an dgo back on Aspitrin 325 mg coated tablet, one a day. She stated she has coated Asprin 81 mg tabs, inquired if she may takje three of those. This RN stated she may. Patient correctly repeated instructions for stopping Plavix and starting ASA. Reminded her of FU in July and advised she call for any further questions or problems. Patient verbalized understanding, agreement.

## 2016-07-11 NOTE — Telephone Encounter (Signed)
Spoke with patient and advised she may take BP medications with aspirin. Reviewed again the correct dose of ASA. She repeated directions correctly, verbalized understanding , appreciation.

## 2016-07-11 NOTE — Telephone Encounter (Signed)
Patient called office to be sure she is able to take Aspirin 325mg  with new blood pressure medications Metoprolol tartrate 25mg  along with Losartan 25mg .  Please call

## 2016-07-15 ENCOUNTER — Telehealth: Payer: Self-pay | Admitting: Internal Medicine

## 2016-07-15 ENCOUNTER — Telehealth: Payer: Self-pay | Admitting: Nurse Practitioner

## 2016-07-15 ENCOUNTER — Encounter (HOSPITAL_COMMUNITY): Payer: Self-pay

## 2016-07-15 ENCOUNTER — Emergency Department (HOSPITAL_COMMUNITY)
Admission: EM | Admit: 2016-07-15 | Discharge: 2016-07-15 | Disposition: A | Discharge status: Home / Self Care | Payer: MEDICARE | Attending: Emergency Medicine | Admitting: Emergency Medicine

## 2016-07-15 DIAGNOSIS — Z8673 Personal history of transient ischemic attack (TIA), and cerebral infarction without residual deficits: Secondary | ICD-10-CM | POA: Diagnosis not present

## 2016-07-15 DIAGNOSIS — E039 Hypothyroidism, unspecified: Secondary | ICD-10-CM | POA: Insufficient documentation

## 2016-07-15 DIAGNOSIS — Z79899 Other long term (current) drug therapy: Secondary | ICD-10-CM | POA: Insufficient documentation

## 2016-07-15 DIAGNOSIS — R42 Dizziness and giddiness: Secondary | ICD-10-CM | POA: Diagnosis present

## 2016-07-15 DIAGNOSIS — Z950 Presence of cardiac pacemaker: Secondary | ICD-10-CM | POA: Insufficient documentation

## 2016-07-15 DIAGNOSIS — E119 Type 2 diabetes mellitus without complications: Secondary | ICD-10-CM | POA: Insufficient documentation

## 2016-07-15 DIAGNOSIS — Z96642 Presence of left artificial hip joint: Secondary | ICD-10-CM | POA: Diagnosis not present

## 2016-07-15 DIAGNOSIS — I10 Essential (primary) hypertension: Secondary | ICD-10-CM | POA: Diagnosis not present

## 2016-07-15 LAB — BASIC METABOLIC PANEL
Anion gap: 9 (ref 5–15)
BUN: 15 mg/dL (ref 6–20)
CALCIUM: 8.9 mg/dL (ref 8.9–10.3)
CO2: 25 mmol/L (ref 22–32)
CREATININE: 0.85 mg/dL (ref 0.44–1.00)
Chloride: 101 mmol/L (ref 101–111)
GFR calc non Af Amer: 58 mL/min — ABNORMAL LOW (ref >60)
Glucose, Bld: 210 mg/dL — ABNORMAL HIGH (ref 65–99)
Potassium: 4.3 mmol/L (ref 3.5–5.1)
Sodium: 135 mmol/L (ref 135–145)

## 2016-07-15 LAB — CBC
HCT: 35.8 % — ABNORMAL LOW (ref 36.0–46.0)
Hemoglobin: 11.8 g/dL — ABNORMAL LOW (ref 12.0–15.0)
MCH: 31.2 pg (ref 26.0–34.0)
MCHC: 33 g/dL (ref 30.0–36.0)
MCV: 94.7 fL (ref 78.0–100.0)
PLATELETS: 186 10*3/uL (ref 150–400)
RBC: 3.78 MIL/uL — ABNORMAL LOW (ref 3.87–5.11)
RDW: 13.9 % (ref 11.5–15.5)
WBC: 4.9 10*3/uL (ref 4.0–10.5)

## 2016-07-15 LAB — I-STAT TROPONIN, ED: TROPONIN I, POC: 0 ng/mL (ref 0.00–0.08)

## 2016-07-15 MED ORDER — HYDROCHLOROTHIAZIDE 12.5 MG PO TABS: 12.5000 mg | ORAL_TABLET | Freq: Every day | ORAL | 0 refills | Status: DC

## 2016-07-15 NOTE — Telephone Encounter (Signed)
Pt says she is having dizziness and her blood pressure is up,it was 203.80 the last time she took it.

## 2016-07-15 NOTE — Progress Notes (Signed)
Electrophysiology Office Note Date: 07/17/2016  ID:  Kayla Davis, DOB 08-22-1922, MRN [id number]  PCP: [name], MD Electrophysiologist: [name]   CC: Follow up on dizziness/arrhythmias seen on remote interrogation  Kayla Davis is a 83 y.o. female seen today for Dr [name].  She presents today for routine electrophysiology followup.  Since last being seen in our clinic, the patient reports doing reasonably well.  Her dizzy spells are improved on metoprolol (corresponding with significantly less AVNRT).  She did go to the ER the other day for hypertension and was started on HCTZ.  She denies chest pain, palpitations, dyspnea, PND, orthopnea, nausea, vomiting, syncope, edema, weight gain, or early satiety.  Device History: MDT dual chamber PPM implanted 2011 for sinus node dysfunction    Past Medical History:  Diagnosis Date  . Anemia, unspecified   . Anxiety state, unspecified   . Aortic valve disorders   . Bradycardia    s/p PPM  . Bursitis   . Complication of anesthesia    has had difficulty awakening in past; pt states did well with the anesthesia given during her carotid surgery   . Diabetes mellitus without complication ([place])   . Diverticulosis of colon (without mention of hemorrhage)   . Herpes zoster with other nervous system complications(053.19)   . Hyperlipidemia   . Hypertension   . Hypothyroidism   . Irritable bowel syndrome   . Menopause   . Osteoarthrosis, unspecified whether generalized or localized, unspecified site   . Peripheral vascular disease ([place])   . TIA (transient ischemic attack)   . Unspecified hearing loss   . Varicose veins    Past Surgical History:  Procedure Laterality Date  . APPENDECTOMY    . CATARACT EXTRACTION, BILATERAL  end of June 2013  . CHOLECYSTECTOMY    . DILATION AND CURETTAGE OF UTERUS    . ENDARTERECTOMY Right 01/20/2014   Procedure: RIGHT CAROTID ARTERY ENDARTERECTOMY WITH DACRON PATCH ANGIOPLASTY;  Surgeon:  Mal Misty, MD;  Location: [facility];  Service: Vascular;  Laterality: Right;  . PACEMAKER INSERTION     MDT dual chamber PPM implanted by Dr [name] for sinus node dysfunction   . TONSILLECTOMY    . TOTAL HIP ARTHROPLASTY Left 03/23/2015   Procedure: TOTAL HIP ARTHROPLASTY ANTERIOR APPROACH;  Surgeon: Gaynelle Arabian, MD;  Location: [facility];  Service: Orthopedics;  Laterality: Left;  [name] VAGINAL HYSTERECTOMY      Current Outpatient Prescriptions  Medication Sig Dispense Refill  . ALPHA LIPOIC ACID PO Take 1 tablet by mouth daily.    . clopidogrel (PLAVIX) 75 MG tablet Take 1 tablet (75 mg total) by mouth daily. 30 tablet 0  . CRANBERRY PO Take 1 tablet by mouth daily with supper.    . Cyanocobalamin (VITAMIN B-12 PO) Take 1 tablet by mouth at bedtime.    [name] Calcium (STOOL SOFTENER PO) Take 2 capsules by mouth at bedtime.    [name] EVENING PRIMROSE OIL PO Take 1 capsule by mouth daily.    . fluticasone (FLONASE ALLERGY RELIEF) 50 MCG/ACT nasal spray Place 1 spray into both nostrils daily.     . hydrochlorothiazide (HYDRODIURIL) 12.5 MG tablet Take 1 tablet (12.5 mg total) by mouth at bedtime. 30 tablet 11  . ipratropium (ATROVENT) 0.06 % nasal spray Place 2 sprays into both nostrils 3 (three) times daily. 45 mL 1  . Lancets (ONETOUCH ULTRASOFT) lancets Use to check blood sugars once a day Dx E11.9 100 each 3  .  levothyroxine (SYNTHROID, LEVOTHROID) 100 MCG tablet Take 1 tablet (100 mcg total) by mouth daily before breakfast. 30 tablet 0  . loratadine (CLARITIN) 10 MG tablet Take 10 mg by mouth daily.     [name] losartan (COZAAR) 25 MG tablet Take 1 tablet (25 mg total) by mouth 2 (two) times daily. 60 tablet 3  . LYRICA 75 MG capsule Take 75 mg by mouth 2 (two) times daily.    . meclizine (ANTIVERT) 12.5 MG tablet Take 1 tablet (12.5 mg total) by mouth 3 (three) times daily as needed for dizziness. 30 tablet 0  . metoprolol tartrate (LOPRESSOR) 25 MG tablet Take 1 tablet (25 mg total) by mouth 2  (two) times daily. 60 tablet 6  . Multiple Vitamin (MULTIVITAMIN WITH MINERALS) TABS tablet Take 1 tablet by mouth daily with supper.    . Multiple Vitamins-Minerals (EYE VITAMINS) CAPS Take 1 capsule by mouth daily.    . ONE TOUCH ULTRA TEST test strip TEST ONCE DAILY AS DIRECTED 50 each 5  . Polyvinyl Alcohol-Povidone (REFRESH OP) Place 1 drop into both eyes 2 (two) times daily.    . simvastatin (ZOCOR) 40 MG tablet Take 1 tablet (40 mg total) by mouth daily with supper. 90 tablet 1   No current facility-administered medications for this visit.     Allergies:   Penicillins; Alprazolam; Bactrim [sulfamethoxazole-trimethoprim]; Ceclor [cefaclor]; Cephalexin; Ciprofloxacin; Clindamycin/lincomycin; Hydrocodone; Noroxin [norfloxacin]; Sulfonamide derivatives; Tetracyclines & related; and Tramadol   Social History: Social History   Social History  . Marital status: Widowed    Spouse name: Gypsy Balsam.  (deceased)  . Number of children: 4  . Years of education: COLLEGE   Occupational History  . Retired   .  Retired   Social History Main Topics  . Smoking status: Never Smoker  . Smokeless tobacco: Never Used  . Alcohol use No  . Drug use: No  . Sexual activity: Not on file   Other Topics Concern  . Not on file   Social History Narrative   Patient is widowed with 4 children.   Patient is right handed.   Patient has college education.   Patient drinks 2 cups daily.    Family History: Family History  Problem Relation Age of Onset  . Heart disease Mother   . Heart disease Father   . Diabetes Father   . Cancer Paternal Grandmother      Review of Systems: All other systems reviewed and are otherwise negative except as noted above.   Physical Exam: VS:  BP 126/70   Pulse 62   Ht 5\' 5"  (1.651 m)   Wt 147 lb 4 oz (66.8 kg)   LMP  (LMP Unknown)   SpO2 98%   BMI 24.50 kg/m  , BMI Body mass index is 24.5 kg/m.  GEN- The patient is elderly appearing, alert and oriented x 3  today.   HEENT: normocephalic, atraumatic; sclera clear, conjunctiva pink; hearing intact; oropharynx clear; neck supple  Lungs- Clear to ausculation bilaterally, normal work of breathing.  No wheezes, rales, rhonchi Heart- Regular rate and rhythm  GI- soft, non-tender, non-distended, bowel sounds present  Extremities- no clubbing, cyanosis, or edema  MS- no significant deformity or atrophy Skin- warm and dry, no rash or lesion; PPM pocket well healed Psych- euthymic mood, full affect Neuro- strength and sensation are intact  PPM Interrogation- reviewed in detail today,  See PACEART report  EKG:  EKG is not ordered today.  Recent Labs: 08/20/2015: Pro B  Natriuretic peptide (BNP) 391.20 07/03/2016: ALT 24 07/04/2016: TSH 0.195 07/15/2016: BUN 15; Creatinine, Ser 0.85; Hemoglobin 11.8; Platelets 186; Potassium 4.3; Sodium 135   Wt Readings from Last 3 Encounters:  07/17/16 147 lb 4 oz (66.8 kg)  07/03/16 148 lb (67.1 kg)  07/02/16 148 lb (67.1 kg)     Other studies Reviewed: Additional studies/ records that were reviewed today include: Dr Jackalyn Lombard office notes  Assessment and Plan:  1.  Sinus node dysfunction Normal PPM function See Pace Art report No changes today  2.  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  HTN Stable No change required today  Current medicines are reviewed at length with the patient today.   The patient does not have concerns regarding her medicines.  The following changes were made today: none   Disposition:   Follow up with [facility], Dr [name] 1 year    Signed, Chanetta Marshall, NP  07/17/2016 1:31 PM  [address] [place] [address] [phone] (office) [phone] (fax)

## 2016-07-15 NOTE — Telephone Encounter (Signed)
LMTCB ./CY 

## 2016-07-15 NOTE — ED Provider Notes (Signed)
Kayla Davis DEPT Provider Note   CSN: [id number] Arrival date & time: 07/15/16  1307     History   Chief Complaint No chief complaint on file.   HPI Kayla Davis is a 83 y.o. female.  HPI   Woke up this morning feeling very dizzy.  Took blood pressure and it was high 200s. Took blood pressure medications.  Had shaking.  Shaking got better with blankets at home and symptoms improved now.  BP was good until recently, now has had change in medications via cardiologist for BP, tried new medication, now on 25mg  BID losartan and metoprolol 25mg  BID.  HCTZ was stopped before because bp was low.   In November started having spells of feeling generalized weakness, saw cardiologist.   Dizziness this AM lightheadedness but denies near-syncope. No chest pain nor shortness of breath. No visual changes, no numbness, no weakness.  No double vision, no visual changes. Reports left eye weakness but difficult to describe. Took off of plavix. Taking aspirin. No infectious symptoms.    Past Medical History:  Diagnosis Date  . Anemia, unspecified   . Anxiety state, unspecified   . Aortic valve disorders   . Bradycardia    s/p PPM  . Bursitis   . Complication of anesthesia    has had difficulty awakening in past; pt states did well with the anesthesia given during her carotid surgery   . Diabetes mellitus without complication ([place])   . Diverticulosis of colon (without mention of hemorrhage)   . Herpes zoster with other nervous system complications(053.19)   . Hyperlipidemia   . Hypertension   . Hypothyroidism   . Irritable bowel syndrome   . Menopause   . Osteoarthrosis, unspecified whether generalized or localized, unspecified site   . Peripheral vascular disease ([place])   . TIA (transient ischemic attack)   . Unspecified hearing loss   . Varicose veins     Patient Active Problem List   Diagnosis Date Noted  . Amaurosis 07/03/2016  . Diet-controlled type 2 diabetes mellitus ([place])  02/29/2016  . Vertigo 12/11/2015  . Gait disorder 06/27/2015  . OA (osteoarthritis) of hip 03/23/2015  . Preop exam for internal medicine 03/04/2015  . Shortness of breath 02/13/2015  . Rhinitis, atrophic 11/22/2014  . Well adult exam 04/21/2014  . Amaurosis fugax 03/31/2014  . History of CEA (carotid endarterectomy) 03/31/2014  . HLD (hyperlipidemia) 03/31/2014  . Carotid stenosis 02/07/2014  . Cerebral infarction ([place]) 01/16/2014  . Dyslipidemia 01/16/2014  . Weakness generalized 04/30/2013  . CAP (community acquired pneumonia) 04/29/2013  . Edema 03/22/2013  . Urinary incontinence in female 03/22/2013  . Constipation 10/01/2012  . UTI (urinary tract infection) 10/01/2012  . Diarrhea 08/26/2012  . Cerumen impaction 12/11/2011  . Pacemaker-Medtronic 07/30/2010  . SINUS BRADYCARDIA 05/17/2010  . DYSURIA 01/29/2010  . Aortic valve disorders 07/31/2009  . Venous (peripheral) insufficiency 07/31/2009  . Hyponatremia 07/01/2009  . LEG CRAMPS 06/12/2009  . LEG PAIN, BILATERAL 02/01/2009  . Backache 08/24/2008  . Anemia 06/21/2008  . DEGENERATIVE JOINT DISEASE 02/19/2008  . CHEST WALL PAIN, HX OF 02/19/2008  . IRRITABLE BOWEL SYNDROME 12/28/2007  . DIABETES MELLITUS, BORDERLINE 11/06/2007  . GASTRITIS 07/08/2007  . POSTHERPETIC NEURALGIA 07/01/2007  . Herpes zoster without mention of complication 26/83/4196  . Hypothyroidism 06/08/2007  . HYPERLIPIDEMIA 06/08/2007  . Anxiety state 06/08/2007  . Hereditary and idiopathic peripheral neuropathy 06/08/2007  . HEARING LOSS 06/08/2007  . Essential hypertension 06/08/2007  . Transient cerebral ischemia 06/08/2007  .  BRONCHITIS, RECURRENT 06/08/2007  . DIVERTICULOSIS OF COLON 06/08/2007  . Osteoporosis 06/08/2007    Past Surgical History:  Procedure Laterality Date  . APPENDECTOMY    . CATARACT EXTRACTION, BILATERAL  end of June 2013  . CHOLECYSTECTOMY    . DILATION AND CURETTAGE OF UTERUS    . ENDARTERECTOMY Right  01/20/2014   Procedure: RIGHT CAROTID ARTERY ENDARTERECTOMY WITH DACRON PATCH ANGIOPLASTY;  Surgeon: Mal Misty, MD;  Location: [facility];  Service: Vascular;  Laterality: Right;  . PACEMAKER INSERTION     MDT dual chamber PPM implanted by Dr [name] for sinus node dysfunction   . TONSILLECTOMY    . TOTAL HIP ARTHROPLASTY Left 03/23/2015   Procedure: TOTAL HIP ARTHROPLASTY ANTERIOR APPROACH;  Surgeon: Gaynelle Arabian, MD;  Location[facility];  Service: Orthopedics;  Laterality: Left;  [name] VAGINAL HYSTERECTOMY      OB History    No data available       Home Medications    Prior to Admission medications   Medication Sig Start Date End Date Taking? Authorizing Provider  ALPHA LIPOIC ACID PO Take 1 tablet by mouth daily.    Historical Provider, MD  clopidogrel (PLAVIX) 75 MG tablet Take 1 tablet (75 mg total) by mouth daily. 07/05/16   Shanker Kristeen Mans, MD  CRANBERRY PO Take 1 tablet by mouth daily with supper.    Historical Provider, MD  Cyanocobalamin (VITAMIN B-12 PO) Take 1 tablet by mouth at bedtime.    Historical Provider, MD  Docusate Calcium (STOOL SOFTENER PO) Take 2 capsules by mouth at bedtime.    Historical Provider, MD  EVENING PRIMROSE OIL PO Take 1 capsule by mouth daily.    Historical Provider, MD  fluticasone [facility] ALLERGY RELIEF) 50 MCG/ACT nasal spray Place 1 spray into both nostrils daily.     Historical Provider, MD  hydrochlorothiazide (HYDRODIURIL) 12.5 MG tablet Take 1 tablet (12.5 mg total) by mouth at bedtime. 07/15/16   Gareth Morgan, MD  ipratropium (ATROVENT) 0.06 % nasal spray Place 2 sprays into both nostrils 3 (three) times daily. 02/20/16   Aleksei Plotnikov V, MD  Lancets (ONETOUCH ULTRASOFT) lancets Use to check blood sugars once a day Dx E11.9 10/10/15   Lew Dawes V, MD  levothyroxine (SYNTHROID, LEVOTHROID) 100 MCG tablet Take 1 tablet (100 mcg total) by mouth daily before breakfast. 07/04/16   Jonetta Osgood, MD  loratadine (CLARITIN) 10 MG tablet  Take 10 mg by mouth daily.     Historical Provider, MD  losartan (COZAAR) 25 MG tablet Take 1 tablet (25 mg total) by mouth 2 (two) times daily. Patient taking differently: Take 25 mg by mouth daily as needed.  07/02/16   Charlene Brooke Nche, NP  LYRICA 75 MG capsule Take 75 mg by mouth 2 (two) times daily. 04/09/16   Historical Provider, MD  meclizine (ANTIVERT) 12.5 MG tablet Take 1 tablet (12.5 mg total) by mouth 3 (three) times daily as needed for dizziness. 11/27/15   Lucretia Kern, DO  metoprolol tartrate (LOPRESSOR) 25 MG tablet Take 1 tablet (25 mg total) by mouth 2 (two) times daily. 06/18/16 09/16/16  Amber Sena Slate, NP  Multiple Vitamin (MULTIVITAMIN WITH MINERALS) TABS tablet Take 1 tablet by mouth daily with supper.    Historical Provider, MD  Multiple Vitamins-Minerals (EYE VITAMINS) CAPS Take 1 capsule by mouth daily.    Historical Provider, MD  ONE TOUCH ULTRA TEST test strip TEST ONCE DAILY AS DIRECTED 10/01/15   Aleksei Plotnikov V,  MD  Polyvinyl Alcohol-Povidone (REFRESH OP) Place 1 drop into both eyes 2 (two) times daily.    Historical Provider, MD  simvastatin (ZOCOR) 40 MG tablet Take 1 tablet (40 mg total) by mouth daily with supper. 11/06/15   [name], MD    Family History Family History  Problem Relation Age of Onset  . Heart disease Mother   . Heart disease Father   . Diabetes Father   . Cancer Paternal Grandmother     Social History Social History  Substance Use Topics  . Smoking status: Never Smoker  . Smokeless tobacco: Never Used  . Alcohol use No     Allergies   Penicillins; Alprazolam; Bactrim [sulfamethoxazole-trimethoprim]; Ceclor [cefaclor]; Cephalexin; Ciprofloxacin; Clindamycin/lincomycin; Hydrocodone; Noroxin [norfloxacin]; Sulfonamide derivatives; Tetracyclines & related; and Tramadol   Review of Systems Review of Systems  Constitutional: Negative for fever.  HENT: Negative for sore throat.   Eyes: Negative for visual disturbance.    Respiratory: Negative for cough and shortness of breath.   Cardiovascular: Negative for chest pain.  Gastrointestinal: Negative for abdominal pain, blood in stool, diarrhea, nausea and vomiting.  Genitourinary: Negative for difficulty urinating and dysuria.  Musculoskeletal: Positive for back pain. Negative for neck pain.  Skin: Negative for rash.  Neurological: Positive for dizziness and light-headedness. Negative for syncope, facial asymmetry, speech difficulty, weakness, numbness and headaches.     Physical Exam Updated Vital Signs BP (!) 152/68   Pulse (!) 59   Temp 97.6 F (36.4 C) (Oral)   Resp 18   LMP  (LMP Unknown)   SpO2 97%   Physical Exam  Constitutional: She is oriented to person, place, and time. She appears well-developed and well-nourished. No distress.  HENT:  Head: Normocephalic and atraumatic.  Eyes: Conjunctivae and EOM are normal.  Neck: Normal range of motion.  Cardiovascular: Normal rate, regular rhythm, normal heart sounds and intact distal pulses.  Exam reveals no gallop and no friction rub.   No murmur heard. Pulmonary/Chest: Effort normal and breath sounds normal. No respiratory distress. She has no wheezes. She has no rales.  Abdominal: Soft. She exhibits no distension. There is no tenderness. There is no guarding.  Musculoskeletal: She exhibits no edema or tenderness.  Neurological: She is alert and oriented to person, place, and time. She has normal strength. No cranial nerve deficit or sensory deficit. Coordination (finger to nose and heel to shin) normal. GCS eye subscore is 4. GCS verbal subscore is 5. GCS motor subscore is 6.  Skin: Skin is warm and dry. No rash noted. She is not diaphoretic. No erythema.  Nursing note and vitals reviewed.    ED Treatments / Results  Labs (all labs ordered are listed, but only abnormal results are displayed) Labs Reviewed  BASIC METABOLIC PANEL - Abnormal; Notable for the following:       Result Value    Glucose, Bld 210 (*)    GFR calc non Af Amer 58 (*)    All other components within normal limits  CBC - Abnormal; Notable for the following:    RBC 3.78 (*)    Hemoglobin 11.8 (*)    HCT 35.8 (*)    All other components within normal limits  I-STAT TROPOININ, ED    EKG  EKG Interpretation None       Radiology No results found.  Procedures Procedures (including critical care time)  Medications Ordered in ED Medications - No data to display   Initial Impression / Assessment and Plan /  ED Course  I have reviewed the triage vital signs and the nursing notes.  Pertinent labs & imaging results that were available during my care of the patient were reviewed by me and considered in my medical decision making (see chart for details).    83 year old female with a history of hypertension, dyslipidemia, diabetes, hypothyroidism, pacemaker, CVA, recent TIA workup 322, who presents with concern for hypertension and upon waking today with lightheadedness. Patient denies other associated neurologic symptoms other than feeling of lightheadedness when she woke up this morning. She developed shaking all over her body, after she had seen how high her blood pressures were, and feel tremors more likely secondary to anxiety in this setting, and unlikely CVA.  She described dizziness, lightheadedness, however no vertigo, and has normal neurologic exam, including normal finger-to-nose, no other abnormalities on neurologic exam or history.  Doubt CVA. Patient without other abnormalities to suggest etiology of dizziness, including no significant anemia or I abnormalities, and no symptoms of infection. Symptoms may be secondary to patient's hypertension. Given patient reporting high blood pressures in the morning, we'll initiate 12.5 mg of HCTZ at night, and recommend close follow-up with her primary care physician. She is scheduled to see for PCP and Cardiologist this week.    Final Clinical Impressions(s)  / ED Diagnoses   Final diagnoses:  Essential hypertension  Lightheadedness    New Prescriptions Discharge Medication List as of 07/15/2016  5:20 PM    START taking these medications   Details  hydrochlorothiazide (HYDRODIURIL) 12.5 MG tablet Take 1 tablet (12.5 mg total) by mouth at bedtime., Starting Tue 07/15/2016, Print         Gareth Morgan, MD 07/16/16 [phone]

## 2016-07-15 NOTE — ED Triage Notes (Signed)
Patient complains of dizziness and tremors this am when she awoke. States that her BP was 200/92 when she checked it and CBG 114. On arrival alert and oriented, no neuro deficits. Unsure the time of onset but no dizziness on assessment. HOH,

## 2016-07-15 NOTE — Telephone Encounter (Signed)
New Message:   Pt is dizzy and blood pressure is 203/80,please call asap to advise.

## 2016-07-15 NOTE — Telephone Encounter (Signed)
ATTEMPTED TO CONTACT PT NO ANSWER

## 2016-07-17 ENCOUNTER — Ambulatory Visit (INDEPENDENT_AMBULATORY_CARE_PROVIDER_SITE_OTHER): Payer: MEDICARE | Admitting: Nurse Practitioner

## 2016-07-17 VITALS — BP 126/70 | HR 62 | Ht 65.0 in | Wt 147.2 lb

## 2016-07-17 DIAGNOSIS — I6521 Occlusion and stenosis of right carotid artery: Secondary | ICD-10-CM

## 2016-07-17 DIAGNOSIS — I471 Supraventricular tachycardia: Secondary | ICD-10-CM | POA: Diagnosis not present

## 2016-07-17 DIAGNOSIS — I10 Essential (primary) hypertension: Secondary | ICD-10-CM | POA: Diagnosis not present

## 2016-07-17 DIAGNOSIS — I495 Sick sinus syndrome: Secondary | ICD-10-CM

## 2016-07-17 DIAGNOSIS — I4719 Other supraventricular tachycardia: Secondary | ICD-10-CM

## 2016-07-17 LAB — CUP PACEART INCLINIC DEVICE CHECK
Date Time Interrogation Session: 20180405105402
Implantable Lead Implant Date: 20111031
Implantable Lead Implant Date: 20111031
Implantable Lead Location: 753860
Implantable Lead Model: 4469
Implantable Lead Model: 4470
Implantable Lead Serial Number: 548557
Implantable Pulse Generator Implant Date: 20111031
MDC IDC LEAD LOCATION: 753859
MDC IDC LEAD SERIAL: 686076

## 2016-07-17 MED ORDER — HYDROCHLOROTHIAZIDE 12.5 MG PO TABS: 12.5000 mg | ORAL_TABLET | Freq: Every day | ORAL | 11 refills | Status: DC

## 2016-07-17 NOTE — Telephone Encounter (Signed)
Pt was seen in [facility] ED on 4/3 and had clinic appointment today with Chanetta Marshall, NP.

## 2016-07-17 NOTE — Patient Instructions (Signed)
Medication Instructions:   Your physician recommends that you continue on your current medications as directed. Please refer to the Current Medication list given to you today.   If you need a refill on your cardiac medications before your next appointment, please call your pharmacy.  Labwork: NONE ORDERED  TODAY    Testing/Procedures: NONE ORDERED  TODAY   Follow-Up: Your physician wants you to follow-up in: [place] will receive a reminder letter in the mail two months in advance. If you don't receive a letter, please call our office to schedule the follow-up appointment.   Remote monitoring is used to monitor your Pacemaker of ICD from home. This monitoring reduces the number of office visits required to check your device to one time per year. It allows [place] to keep an eye on the functioning of your device to ensure it is working properly. You are scheduled for a device check from home on .10-16-2016 You may send your transmission at any time that day. If you have a wireless device, the transmission will be sent automatically. After your physician reviews your transmission, you will receive a postcard with your next transmission date.     Any Other Special Instructions Will Be Listed Below (If Applicable).

## 2016-07-18 ENCOUNTER — Encounter: Payer: Self-pay | Admitting: Internal Medicine

## 2016-07-18 ENCOUNTER — Ambulatory Visit (INDEPENDENT_AMBULATORY_CARE_PROVIDER_SITE_OTHER): Payer: MEDICARE | Admitting: Internal Medicine

## 2016-07-18 DIAGNOSIS — E119 Type 2 diabetes mellitus without complications: Secondary | ICD-10-CM

## 2016-07-18 DIAGNOSIS — E034 Atrophy of thyroid (acquired): Secondary | ICD-10-CM | POA: Diagnosis not present

## 2016-07-18 DIAGNOSIS — G453 Amaurosis fugax: Secondary | ICD-10-CM | POA: Diagnosis not present

## 2016-07-18 DIAGNOSIS — I6522 Occlusion and stenosis of left carotid artery: Secondary | ICD-10-CM | POA: Diagnosis not present

## 2016-07-18 DIAGNOSIS — F411 Generalized anxiety disorder: Secondary | ICD-10-CM

## 2016-07-18 DIAGNOSIS — I10 Essential (primary) hypertension: Secondary | ICD-10-CM

## 2016-07-18 NOTE — Assessment & Plan Note (Signed)
Doing well Valerian root for anxiety as needed

## 2016-07-18 NOTE — Assessment & Plan Note (Signed)
On diet Labs 

## 2016-07-18 NOTE — Assessment & Plan Note (Signed)
ASA No relapse 

## 2016-07-18 NOTE — Assessment & Plan Note (Signed)
ASA Simvastatin 

## 2016-07-18 NOTE — Progress Notes (Signed)
Subjective:  Patient ID: Kayla Davis, female    DOB: April 29, 1922  Age: 83 y.o. MRN: [id number]  CC: Back Pain (pt stated lower back been painful for 6 weeks) and Hospitalization Follow-up   HPI WILLINE SCHWALBE presents for post-hosp f/u for TIA, high BP. Poor historian - chart reviewed F/u DM, B12 def  Outpatient Medications Prior to Visit  Medication Sig Dispense Refill  . ALPHA LIPOIC ACID PO Take 1 tablet by mouth daily.    [name] CRANBERRY PO Take 1 tablet by mouth daily with supper.    . Cyanocobalamin (VITAMIN B-12 PO) Take 1 tablet by mouth at bedtime.    [name] Calcium (STOOL SOFTENER PO) Take 2 capsules by mouth at bedtime.    [name] EVENING PRIMROSE OIL PO Take 1 capsule by mouth daily.    . fluticasone (FLONASE ALLERGY RELIEF) 50 MCG/ACT nasal spray Place 1 spray into both nostrils daily.     . hydrochlorothiazide (HYDRODIURIL) 12.5 MG tablet Take 1 tablet (12.5 mg total) by mouth at bedtime. 30 tablet 11  . ipratropium (ATROVENT) 0.06 % nasal spray Place 2 sprays into both nostrils 3 (three) times daily. 45 mL 1  . Lancets (ONETOUCH ULTRASOFT) lancets Use to check blood sugars once a day Dx E11.9 100 each 3  . levothyroxine (SYNTHROID, LEVOTHROID) 100 MCG tablet Take 1 tablet (100 mcg total) by mouth daily before breakfast. 30 tablet 0  . loratadine (CLARITIN) 10 MG tablet Take 10 mg by mouth daily.     [name] losartan (COZAAR) 25 MG tablet Take 1 tablet (25 mg total) by mouth 2 (two) times daily. 60 tablet 3  . LYRICA 75 MG capsule Take 75 mg by mouth 2 (two) times daily.    . metoprolol tartrate (LOPRESSOR) 25 MG tablet Take 1 tablet (25 mg total) by mouth 2 (two) times daily. 60 tablet 6  . Multiple Vitamin (MULTIVITAMIN WITH MINERALS) TABS tablet Take 1 tablet by mouth daily with supper.    . Multiple Vitamins-Minerals (EYE VITAMINS) CAPS Take 1 capsule by mouth daily.    . ONE TOUCH ULTRA TEST test strip TEST ONCE DAILY AS DIRECTED 50 each 5  . Polyvinyl Alcohol-Povidone  (REFRESH OP) Place 1 drop into both eyes 2 (two) times daily.    . simvastatin (ZOCOR) 40 MG tablet Take 1 tablet (40 mg total) by mouth daily with supper. 90 tablet 1  . clopidogrel (PLAVIX) 75 MG tablet Take 1 tablet (75 mg total) by mouth daily. 30 tablet 0  . meclizine (ANTIVERT) 12.5 MG tablet Take 1 tablet (12.5 mg total) by mouth 3 (three) times daily as needed for dizziness. 30 tablet 0   No facility-administered medications prior to visit.     ROS Review of Systems  Constitutional: Negative for activity change, appetite change, chills, fatigue and unexpected weight change.  HENT: Negative for congestion, mouth sores and sinus pressure.   Eyes: Negative for visual disturbance.  Respiratory: Negative for cough and chest tightness.   Gastrointestinal: Negative for abdominal pain and nausea.  Genitourinary: Negative for difficulty urinating, frequency and vaginal pain.  Musculoskeletal: Positive for gait problem. Negative for back pain.  Skin: Negative for pallor and rash.  Neurological: Negative for dizziness, tremors, weakness, numbness and headaches.  Psychiatric/Behavioral: Negative for confusion and sleep disturbance.    Objective:  BP 116/62   Pulse 67   Temp 97.7 F (36.5 C)   Ht 5\' 5"  (1.651 m)   Wt 148 lb (67.1 kg)  LMP  (LMP Unknown)   SpO2 97%   BMI 24.63 kg/m   BP Readings from Last 3 Encounters:  07/18/16 116/62  07/17/16 126/70  07/15/16 (!) 152/68    Wt Readings from Last 3 Encounters:  07/18/16 148 lb (67.1 kg)  07/17/16 147 lb 4 oz (66.8 kg)  07/03/16 148 lb (67.1 kg)    Physical Exam  Constitutional: She appears well-developed. No distress.  HENT:  Head: Normocephalic.  Right Ear: External ear normal.  Left Ear: External ear normal.  Nose: Nose normal.  Mouth/Throat: Oropharynx is clear and moist.  Eyes: Conjunctivae are normal. Pupils are equal, round, and reactive to light. Right eye exhibits no discharge. Left eye exhibits no discharge.   Neck: Normal range of motion. Neck supple. No JVD present. No tracheal deviation present. No thyromegaly present.  Cardiovascular: Normal rate, regular rhythm and normal heart sounds.   Pulmonary/Chest: No stridor. No respiratory distress. She has no wheezes.  Abdominal: Soft. Bowel sounds are normal. She exhibits no distension and no mass. There is no tenderness. There is no rebound and no guarding.  Musculoskeletal: She exhibits no edema or tenderness.  Lymphadenopathy:    She has no cervical adenopathy.  Neurological: She displays normal reflexes. No cranial nerve deficit. She exhibits normal muscle tone. Coordination abnormal.  Skin: No rash noted. No erythema.  Psychiatric: She has a normal mood and affect. Her behavior is normal. Judgment and thought content normal.  walker  Lab Results  Component Value Date   WBC 4.9 07/15/2016   HGB 11.8 (L) 07/15/2016   HCT 35.8 (L) 07/15/2016   PLT 186 07/15/2016   GLUCOSE 210 (H) 07/15/2016   CHOL 145 07/04/2016   TRIG 71 07/04/2016   HDL 54 07/04/2016   LDLDIRECT 143.0 10/07/2007   LDLCALC 77 07/04/2016   ALT 24 07/03/2016   AST 24 07/03/2016   NA 135 07/15/2016   K 4.3 07/15/2016   CL 101 07/15/2016   CREATININE 0.85 07/15/2016   BUN 15 07/15/2016   CO2 25 07/15/2016   TSH 0.195 (L) 07/04/2016   INR 1.06 07/03/2016   HGBA1C 6.2 (H) 07/04/2016    No results found.  Assessment & Plan:   There are no diagnoses linked to this encounter. I have discontinued Ms. Abdo's meclizine and clopidogrel. I am also having her maintain her fluticasone, ONE TOUCH ULTRA TEST, onetouch ultrasoft, loratadine, simvastatin, ipratropium, Polyvinyl Alcohol-Povidone (REFRESH OP), ALPHA LIPOIC ACID PO, EVENING PRIMROSE OIL PO, EYE VITAMINS, CRANBERRY PO, Docusate Calcium (STOOL SOFTENER PO), Cyanocobalamin (VITAMIN B-12 PO), multivitamin with minerals, metoprolol tartrate, LYRICA, losartan, levothyroxine, hydrochlorothiazide, and aspirin EC.  Meds  ordered this encounter  Medications  . aspirin EC 81 MG tablet    Sig: Take 81 mg by mouth daily.  [name] DISCONTD: aspirin EC 81 MG tablet    Sig: Take 81 mg by mouth daily.     Follow-up: No Follow-up on file.  [name], MD

## 2016-07-18 NOTE — Assessment & Plan Note (Signed)
Losartan bid  HCT 12.5 mg at hs Metoprolol 

## 2016-07-18 NOTE — Patient Instructions (Signed)
Valerian root for anxiety as needed

## 2016-07-18 NOTE — Assessment & Plan Note (Signed)
On Levothroid 

## 2016-07-23 ENCOUNTER — Other Ambulatory Visit: Payer: Self-pay | Admitting: Internal Medicine

## 2016-07-24 ENCOUNTER — Other Ambulatory Visit: Payer: Self-pay | Admitting: Internal Medicine

## 2016-08-04 ENCOUNTER — Other Ambulatory Visit: Payer: Self-pay | Admitting: Nurse Practitioner

## 2016-08-07 DIAGNOSIS — M47816 Spondylosis without myelopathy or radiculopathy, lumbar region: Secondary | ICD-10-CM | POA: Diagnosis not present

## 2016-08-07 DIAGNOSIS — M5136 Other intervertebral disc degeneration, lumbar region: Secondary | ICD-10-CM | POA: Diagnosis not present

## 2016-08-07 DIAGNOSIS — M4187 Other forms of scoliosis, lumbosacral region: Secondary | ICD-10-CM | POA: Diagnosis not present

## 2016-08-07 DIAGNOSIS — M4686 Other specified inflammatory spondylopathies, lumbar region: Secondary | ICD-10-CM | POA: Diagnosis not present

## 2016-08-11 ENCOUNTER — Ambulatory Visit (INDEPENDENT_AMBULATORY_CARE_PROVIDER_SITE_OTHER): Payer: MEDICARE | Admitting: Diagnostic Neuroimaging

## 2016-08-11 ENCOUNTER — Encounter (INDEPENDENT_AMBULATORY_CARE_PROVIDER_SITE_OTHER): Payer: Self-pay

## 2016-08-11 ENCOUNTER — Encounter: Payer: Self-pay | Admitting: Diagnostic Neuroimaging

## 2016-08-11 VITALS — BP 113/66 | HR 68 | Wt 149.8 lb

## 2016-08-11 DIAGNOSIS — I6522 Occlusion and stenosis of left carotid artery: Secondary | ICD-10-CM

## 2016-08-11 DIAGNOSIS — G453 Amaurosis fugax: Secondary | ICD-10-CM

## 2016-08-11 DIAGNOSIS — I10 Essential (primary) hypertension: Secondary | ICD-10-CM

## 2016-08-11 DIAGNOSIS — H538 Other visual disturbances: Secondary | ICD-10-CM

## 2016-08-11 DIAGNOSIS — E784 Other hyperlipidemia: Secondary | ICD-10-CM | POA: Diagnosis not present

## 2016-08-11 DIAGNOSIS — E7849 Other hyperlipidemia: Secondary | ICD-10-CM

## 2016-08-11 NOTE — Patient Instructions (Signed)

## 2016-08-11 NOTE — Progress Notes (Signed)
[facility]  PATIENT: Kayla Davis DOB: [birth year]  REFERRING CLINICIAN: Ghimire HISTORY FROM: patient  REASON FOR VISIT: new consult    HISTORICAL  CHIEF COMPLAINT:  Chief Complaint  Patient presents with  . Transient cerebral ischemia    rm 7, Dr Erlinda Hong, dgtr- Nena Jordan, "2 episodes of seeing dbl in L eye yesterday; 2-3 episodes of left chest, L arm pain"  . Amaurosis fugax    last seen 10/2014 for this dx  . Follow-up    hospital FU    HISTORY OF PRESENT ILLNESS:   83 year old female with hypertension, hyperlipidemia, diabetes, hypothyroidism, here for evaluation of hospital follow-up. Patient was admitted to hospital in March 2018 for transient left eye blurred vision and double vision. Patient was found to have left internal carotid artery calcified plaque with approximately 25% stenosis. She was on aspirin at the time and this was switched to Plavix. Statin was continued. Blood pressure medications continued.  Since that time patient has done well. Is requesting just a patient had a brief episode of left eye blurred vision again. She also had some left arm and left chest pain yesterday. Symptoms have resolved. She did not seek medical attention for these.   REVIEW OF SYSTEMS: Full 14 system review of systems performed and negative with exception of: Blurred vision double vision incontinence joint pain cramps allergies runny nose.  ALLERGIES: Allergies  Allergen Reactions  . Penicillins Swelling and Other (See Comments)    BODY SWELLS & PT STATES THAT SHE ALMOST DIED Has patient had a PCN reaction causing immediate rash, facial/tongue/throat swelling, SOB or lightheadedness with hypotension: Yes Has patient had a PCN reaction causing severe rash involving mucus membranes or skin necrosis: known Has patient had a PCN reaction that required hospitalization Yes Has patient had a PCN reaction occurring within the last 10 years: No If all of the above answers  are "NO", then may proceed with Cephalosporin u  . Alprazolam Swelling    EYES SWELL  . Bactrim [Sulfamethoxazole-Trimethoprim]     Unable to remember the side effects  . Ceclor [Cefaclor]     Unsure of the side effects  . Cephalexin     Unsure of the side effects  . Ciprofloxacin Itching and Swelling    Pt states that she tolerated po cipro well, but not the higher dose given IV  . Clindamycin/Lincomycin Nausea Only    Upset stomach  . Hydrocodone     Unsure of reaction to med  . Noroxin [Norfloxacin]     Unsure of side effects  . Sulfonamide Derivatives Itching  . Tetracyclines & Related     Unsure of the reaction to this medication  . Tramadol Other (See Comments)    Did not like how she "felt"    HOME MEDICATIONS: Outpatient Medications Prior to Visit  Medication Sig Dispense Refill  . ALPHA LIPOIC ACID PO Take 1 tablet by mouth daily.    [name] CRANBERRY PO Take 1 tablet by mouth daily with supper.    . Cyanocobalamin (VITAMIN B-12 PO) Take 1 tablet by mouth at bedtime.    [name] Calcium (STOOL SOFTENER PO) Take 2 capsules by mouth at bedtime.    [name] EVENING PRIMROSE OIL PO Take 1 capsule by mouth daily.    . fluticasone (FLONASE ALLERGY RELIEF) 50 MCG/ACT nasal spray Place 1 spray into both nostrils daily.     . hydrochlorothiazide (HYDRODIURIL) 12.5 MG tablet Take 1 tablet (12.5 mg total) by mouth at  bedtime. 30 tablet 11  . ipratropium (ATROVENT) 0.06 % nasal spray Place 2 sprays into both nostrils 3 (three) times daily. 45 mL 1  . Lancets (ONETOUCH ULTRASOFT) lancets Use to check blood sugars once a day Dx E11.9 100 each 3  . levothyroxine (SYNTHROID, LEVOTHROID) 100 MCG tablet TAKE 1 TABLET BY MOUTH EVERY DAY BEFORE BREAKFAST 30 tablet 5  . loratadine (CLARITIN) 10 MG tablet Take 10 mg by mouth daily.     [name] losartan (COZAAR) 25 MG tablet Take 1 tablet (25 mg total) by mouth 2 (two) times daily. 60 tablet 3  . LYRICA 75 MG capsule Take 75 mg by mouth 2 (two) times daily.     . metoprolol tartrate (LOPRESSOR) 25 MG tablet Take 1 tablet (25 mg total) by mouth 2 (two) times daily. 60 tablet 6  . Multiple Vitamin (MULTIVITAMIN WITH MINERALS) TABS tablet Take 1 tablet by mouth daily with supper.    . Multiple Vitamins-Minerals (EYE VITAMINS) CAPS Take 1 capsule by mouth daily.    . ONE TOUCH ULTRA TEST test strip TEST ONCE DAILY AS DIRECTED 50 each 5  . Polyvinyl Alcohol-Povidone (REFRESH OP) Place 1 drop into both eyes 2 (two) times daily.    . simvastatin (ZOCOR) 40 MG tablet TAKE 1 TABLET DAILY WITH   SUPPER 90 tablet 1  . aspirin EC 81 MG tablet Take 81 mg by mouth daily.     No facility-administered medications prior to visit.     PAST MEDICAL HISTORY: Past Medical History:  Diagnosis Date  . Anemia, unspecified   . Anxiety state, unspecified   . Aortic valve disorders   . Bradycardia    s/p PPM  . Bursitis   . Complication of anesthesia    has had difficulty awakening in past; pt states did well with the anesthesia given during her carotid surgery   . Diabetes mellitus without complication ([place])   . Diverticulosis of colon (without mention of hemorrhage)   . Herpes zoster with other nervous system complications(053.19)   . Hyperlipidemia   . Hypertension   . Hypothyroidism   . Irritable bowel syndrome   . Menopause   . Osteoarthrosis, unspecified whether generalized or localized, unspecified site   . Peripheral vascular disease ([place])   . TIA (transient ischemic attack)   . Unspecified hearing loss   . Varicose veins     PAST SURGICAL HISTORY: Past Surgical History:  Procedure Laterality Date  . APPENDECTOMY    . CATARACT EXTRACTION, BILATERAL  end of June 2013  . CHOLECYSTECTOMY    . DILATION AND CURETTAGE OF UTERUS    . ENDARTERECTOMY Right 01/20/2014   Procedure: RIGHT CAROTID ARTERY ENDARTERECTOMY WITH DACRON PATCH ANGIOPLASTY;  Surgeon: Mal Misty, MD;  Location: [facility];  Service: Vascular;  Laterality: Right;  . PACEMAKER  INSERTION     MDT dual chamber PPM implanted by Dr [name] for sinus node dysfunction   . TONSILLECTOMY    . TOTAL HIP ARTHROPLASTY Left 03/23/2015   Procedure: TOTAL HIP ARTHROPLASTY ANTERIOR APPROACH;  Surgeon: Gaynelle Arabian, MD;  Location: [facility];  Service: Orthopedics;  Laterality: Left;  [name] VAGINAL HYSTERECTOMY      FAMILY HISTORY: Family History  Problem Relation Age of Onset  . Heart disease Mother   . Heart disease Father   . Diabetes Father   . Cancer Paternal Grandmother     SOCIAL HISTORY:  Social History   Social History  . Marital status: Widowed  Spouse name: Gypsy Balsam.  (deceased)  . Number of children: 4  . Years of education: COLLEGE   Occupational History  . Retired   .  Retired   Social History Main Topics  . Smoking status: Never Smoker  . Smokeless tobacco: Never Used  . Alcohol use No  . Drug use: No  . Sexual activity: Not on file   Other Topics Concern  . Not on file   Social History Narrative   Patient is widowed with 4 children. Son stays with patient at night, 08/11/16.   Patient is right handed.   Patient has college education.   Patient drinks 2 cups daily.    PHYSICAL EXAM  GENERAL EXAM/CONSTITUTIONAL: Vitals:  Vitals:   08/11/16 1427  BP: 113/66  Pulse: 68  Weight: 149 lb 12.8 oz (67.9 kg)     Body mass index is 24.93 kg/m.  No exam data present  Patient is in no distress; well developed, nourished and groomed; neck is supple  CARDIOVASCULAR:  Examination of carotid arteries is normal; no carotid bruits  Regular rate and rhythm, no murmurs  Examination of peripheral vascular system by observation and palpation is normal  EYES:  Ophthalmoscopic exam of optic discs and posterior segments is normal; no papilledema or hemorrhages  MUSCULOSKELETAL:  Gait, strength, tone, movements noted in Neurologic exam below  NEUROLOGIC: MENTAL STATUS:  MMSE - Mini Mental State Exam 07/03/2015  Not completed: (No Data)     awake, alert, oriented to person, place and time  recent and remote memory intact  normal attention and concentration  language fluent, comprehension intact, naming intact,   fund of knowledge appropriate  CRANIAL NERVE:   2nd - no papilledema on fundoscopic exam  2nd, 3rd, 4th, 6th - pupils equal and reactive to light, visual fields full to confrontation, extraocular muscles intact, no nystagmus  5th - facial sensation symmetric  7th - facial strength symmetric  8th - hearing --> DECREASED  9th - palate elevates symmetrically, uvula midline  11th - shoulder shrug symmetric  12th - tongue protrusion midline  HOARSE VOICE  MOTOR:   normal bulk and tone, full strength in the BUE, BLE  SENSORY:   normal and symmetric to light touch  COORDINATION:   finger-nose-finger, fine finger movements normal  REFLEXES:   deep tendon reflexes present and symmetric  GAIT/STATION:   narrow based gait; ABLE TO WALK WITH WALKER SLOWLY AND CAREFULLY    DIAGNOSTIC DATA (LABS, IMAGING, TESTING) - I reviewed patient records, labs, notes, testing and imaging myself where available.  Lab Results  Component Value Date   WBC 4.9 07/15/2016   HGB 11.8 (L) 07/15/2016   HCT 35.8 (L) 07/15/2016   MCV 94.7 07/15/2016   PLT 186 07/15/2016      Component Value Date/Time   NA 135 07/15/2016 1335   K 4.3 07/15/2016 1335   CL 101 07/15/2016 1335   CO2 25 07/15/2016 1335   GLUCOSE 210 (H) 07/15/2016 1335   BUN 15 07/15/2016 1335   CREATININE 0.85 07/15/2016 1335   CALCIUM 8.9 07/15/2016 1335   PROT 6.1 (L) 07/03/2016 1549   ALBUMIN 3.8 07/03/2016 1549   AST 24 07/03/2016 1549   ALT 24 07/03/2016 1549   ALKPHOS 76 07/03/2016 1549   BILITOT 0.5 07/03/2016 1549   GFRNONAA 58 (L) 07/15/2016 1335   GFRAA >60 07/15/2016 1335   Lab Results  Component Value Date   CHOL 145 07/04/2016   HDL 54 07/04/2016  LDLCALC 77 07/04/2016   LDLDIRECT 143.0 10/07/2007   TRIG 71  07/04/2016   CHOLHDL 2.7 07/04/2016   Lab Results  Component Value Date   HGBA1C 6.2 (H) 07/04/2016   Lab Results  Component Value Date   VITAMINB12 1,264 (H) 08/12/2013   Lab Results  Component Value Date   TSH 0.195 (L) 07/04/2016    07/03/16 CTA head/neck [I reviewed images myself and agree with interpretation. -VRP]  1. Negative CTA for emergent large vessel occlusion. 2. Calcified atheromatous plaque at the left carotid bifurcation with mild stenosis of approximately 25% by NASCET criteria. 3. Sequelae of prior right CEA without residual stenosis.  4. Smooth atheromatous plaque throughout the carotid siphons with mild diffuse narrowing.  07/04/16 TTE - Left ventricle: The cavity size was normal. There was moderate   concentric hypertrophy. Systolic function was vigorous. The   estimated ejection fraction was in the range of 65% to 70%. Wall   motion was normal; there were no regional wall motion   abnormalities. Doppler parameters are consistent with abnormal   left ventricular relaxation (grade 1 diastolic dysfunction).   Doppler parameters are consistent with elevated ventricular   end-diastolic filling pressure. - Aortic valve: Trileaflet; moderately thickened, moderately   calcified leaflets. There was mild regurgitation. - Aortic root: The aortic root was normal in size. - Mitral valve: The findings are consistent with mild stenosis.   Valve area by continuity equation (using LVOT flow): 1.35 cm^2. - Right ventricle: Pacer wire or catheter noted in right ventricle. - Right atrium: Pacer wire or catheter noted in right atrium. - Pulmonary arteries: Systolic pressure was mildly increased. PA   peak pressure: 35 mm Hg (S). - Inferior vena cava: The vessel was dilated. The respirophasic   diameter changes were blunted (< 50%), consistent with elevated   central venous pressure.     ASSESSMENT AND PLAN  83 y.o. year old female here with intermittent left eye  blurred vision and double vision.  Ddx: left internal carotid artery (calcified plaque; 25% stenosis) TIA --> left eye blurred vision  1. Blurred vision, left eye   2. Amaurosis fugax   3. Essential hypertension   4. Other hyperlipidemia      PLAN: - medical mgmt with aspirin, BP control, statin, nutrition and physical activity - follow up with PCP  Return if symptoms worsen or fail to improve, for return to PCP.    Penni Bombard, MD 4/98/2641, 5:83 PM Certified in Neurology, Neurophysiology and Neuroimaging  [facility] [address], [place], [place] [address] [phone]

## 2016-08-30 ENCOUNTER — Other Ambulatory Visit: Payer: Self-pay | Admitting: Family

## 2016-09-03 ENCOUNTER — Ambulatory Visit: Payer: MEDICARE | Admitting: Internal Medicine

## 2016-09-11 ENCOUNTER — Ambulatory Visit (INDEPENDENT_AMBULATORY_CARE_PROVIDER_SITE_OTHER): Payer: MEDICARE | Admitting: *Deleted

## 2016-09-11 VITALS — BP 118/58 | HR 61 | Resp 20 | Ht 65.0 in | Wt 151.0 lb

## 2016-09-11 DIAGNOSIS — Z0000 Encounter for general adult medical examination without abnormal findings: Secondary | ICD-10-CM

## 2016-09-11 NOTE — Patient Instructions (Addendum)
Continue to eat heart healthy diet (full of fruits, vegetables, whole grains, lean protein, water--limit salt, fat, and sugar intake) and increase physical activity as tolerated.  Continue doing brain stimulating activities (puzzles, reading, adult coloring books, staying active) to keep memory sharp.    Kayla Davis , Thank you for taking time to come for your Medicare Wellness Visit. I appreciate your ongoing commitment to your health goals. Please review the following plan we discussed and let me know if I can assist you in the future.   These are the goals we discussed: Goals      Patient Stated   . patient (pt-stated)          Wants to live to see 46 and 83 year old grow up. Keep exercising; keep moving; keep busy      Other   . Continue to do my  normal activities, enjoy family and church       This is a list of the screening recommended for you and due dates:  Health Maintenance  Topic Date Due  . Complete foot exam   12/17/1934  . Tetanus Vaccine  12/17/1943  . Flu Shot  11/12/2016  . Eye exam for diabetics  11/22/2016  . Hemoglobin A1C  01/04/2017  . Pneumonia vaccines  Completed  . DEXA scan (bone density measurement)  Excluded

## 2016-09-11 NOTE — Progress Notes (Addendum)
Subjective:   Kayla Davis is a 83 y.o. female who presents for Medicare Annual (Subsequent) preventive examination.  Review of Systems:  No ROS.  Medicare Wellness Visit.  Cardiac Risk Factors include: advanced age (>66men, >3 women);diabetes mellitus;dyslipidemia;hypertension Sleep patterns: feels rested on waking, gets up 1-2 times nightly to void and sleeps 6-8 hours nightly.  Patient's sleep schedule is 0300 to 1100, stating this is her baseline that works for her. Home Safety/Smoke Alarms: Feels safe in home. Smoke alarms in place.   Living environment; residence and Firearm Safety: 2-story house, equipment: Walkers, Type: Conservation officer, nature, Hydrologist, Type: Tub Surveyor, quantity and wheelchair, no firearms. Lives with son, has good family support Seat Belt Safety/Bike Helmet: Wears seat belt.   Counseling:   Eye Exam- appointment yearly   Dental- appointment yearly   Female:   Pap- N/A     Mammo- Last 06/24/06, negative      Dexa scan-none, aged related osteoporosis     CCS-N/A     Objective:     Vitals: BP (!) 118/58   Pulse 61   Resp 20   Ht 5\' 5"  (1.651 m)   Wt 151 lb (68.5 kg)   LMP  (LMP Unknown)   SpO2 98%   BMI 25.13 kg/m   Body mass index is 25.13 kg/m.   Tobacco History  Smoking Status  . Never Smoker  Smokeless Tobacco  . Never Used     Counseling given: Not Answered   Past Medical History:  Diagnosis Date  . Anemia, unspecified   . Anxiety state, unspecified   . Aortic valve disorders   . Bradycardia    s/p PPM  . Bursitis   . Complication of anesthesia    has had difficulty awakening in past; pt states did well with the anesthesia given during her carotid surgery   . Diabetes mellitus without complication ([place])   . Diverticulosis of colon (without mention of hemorrhage)   . Herpes zoster with other nervous system complications(053.19)   . Hyperlipidemia   . Hypertension   . Hypothyroidism   . Irritable bowel syndrome   .  Menopause   . Osteoarthrosis, unspecified whether generalized or localized, unspecified site   . Peripheral vascular disease ([place])   . TIA (transient ischemic attack)   . Unspecified hearing loss   . Varicose veins    Past Surgical History:  Procedure Laterality Date  . APPENDECTOMY    . CATARACT EXTRACTION, BILATERAL  end of June 2013  . CHOLECYSTECTOMY    . DILATION AND CURETTAGE OF UTERUS    . ENDARTERECTOMY Right 01/20/2014   Procedure: RIGHT CAROTID ARTERY ENDARTERECTOMY WITH DACRON PATCH ANGIOPLASTY;  Surgeon: Mal Misty, MD;  Location: [facility];  Service: Vascular;  Laterality: Right;  . PACEMAKER INSERTION     MDT dual chamber PPM implanted by Dr [name] for sinus node dysfunction   . TONSILLECTOMY    . TOTAL HIP ARTHROPLASTY Left 03/23/2015   Procedure: TOTAL HIP ARTHROPLASTY ANTERIOR APPROACH;  Surgeon: Gaynelle Arabian, MD;  Location: [facility];  Service: Orthopedics;  Laterality: Left;  [name] VAGINAL HYSTERECTOMY     Family History  Problem Relation Age of Onset  . Heart disease Mother   . Heart disease Father   . Diabetes Father   . Cancer Paternal Grandmother    History  Sexual Activity  . Sexual activity: Not on file    Outpatient Encounter Prescriptions as of 09/11/2016  Medication Sig  . ALPHA LIPOIC ACID  PO Take 1 tablet by mouth daily.  [name] aspirin 325 MG tablet Take 325 mg by mouth daily.  [name] aspirin 325 MG tablet Take 325 mg by mouth daily.  [name] CRANBERRY PO Take 1 tablet by mouth daily with supper.  . Cyanocobalamin (VITAMIN B-12 PO) Take 1 tablet by mouth at bedtime.  [name] Calcium (STOOL SOFTENER PO) Take 2 capsules by mouth at bedtime.  [name] EVENING PRIMROSE OIL PO Take 1 capsule by mouth daily.  . fexofenadine (ALLEGRA) 30 MG tablet Take 30 mg by mouth 2 (two) times daily.  . fluticasone (FLONASE ALLERGY RELIEF) 50 MCG/ACT nasal spray Place 1 spray into both nostrils daily.   . hydrochlorothiazide (HYDRODIURIL) 12.5 MG tablet Take 1 tablet (12.5 mg total) by  mouth at bedtime.  [name] ipratropium (ATROVENT) 0.06 % nasal spray Place 2 sprays into both nostrils 3 (three) times daily.  . Lancets (ONETOUCH ULTRASOFT) lancets Use to check blood sugars once a day Dx E11.9  . levothyroxine (SYNTHROID, LEVOTHROID) 100 MCG tablet TAKE 1 TABLET BY MOUTH EVERY DAY BEFORE BREAKFAST  . losartan (COZAAR) 25 MG tablet Take 1 tablet (25 mg total) by mouth 2 (two) times daily.  [name] LYRICA 75 MG capsule Take 75 mg by mouth 2 (two) times daily.  . metoprolol tartrate (LOPRESSOR) 25 MG tablet Take 1 tablet (25 mg total) by mouth 2 (two) times daily.  . Multiple Vitamin (MULTIVITAMIN WITH MINERALS) TABS tablet Take 1 tablet by mouth daily with supper.  . Multiple Vitamins-Minerals (EYE VITAMINS) CAPS Take 1 capsule by mouth daily.  . ONE TOUCH ULTRA TEST test strip TEST ONCE DAILY AS DIRECTED  . Polyethyl [name] (SYSTANE OP) Apply to eye daily.  . simvastatin (ZOCOR) 40 MG tablet TAKE 1 TABLET DAILY WITH   SUPPER  . [DISCONTINUED] loratadine (CLARITIN) 10 MG tablet Take 10 mg by mouth daily.   . [DISCONTINUED] Polyvinyl Alcohol-Povidone (REFRESH OP) Place 1 drop into both eyes 2 (two) times daily.   No facility-administered encounter medications on file as of 09/11/2016.     Activities of Daily Living In your present state of health, do you have any difficulty performing the following activities: 09/11/2016 07/03/2016  Hearing? Tempie Donning  Vision? N N  Difficulty concentrating or making decisions? Y N  Walking or climbing stairs? Y N  Dressing or bathing? N N  Doing errands, shopping? N N  Preparing Food and eating ? N -  Using the Toilet? N -  In the past six months, have you accidently leaked urine? N -  Do you have problems with loss of bowel control? N -  Managing your Medications? N -  Managing your Finances? N -  Housekeeping or managing your Housekeeping? N -  Some recent data might be hidden    Patient Care Team: Plotnikov, Evie Lacks, MD as PCP -  General (Internal Medicine) Shon Hough, MD as Consulting Physician (Ophthalmology) Thompson Grayer, MD as Consulting Physician (Cardiology) Suella Broad, MD as Consulting Physician (Physical Medicine and Rehabilitation) Gaynelle Arabian, MD as Consulting Physician (Orthopedic Surgery)    Assessment:    Physical assessment deferred to PCP.  Exercise Activities and Dietary recommendations Current Exercise Habits: Home exercise routine, Type of exercise: calisthenics (chair exercises), Time (Minutes): 35, Frequency (Times/Week): 4, Weekly Exercise (Minutes/Week): 140, Intensity: Mild, Exercise limited by: None identified  Diet (meal preparation, eat out, water intake, caffeinated beverages, dairy products, fruits and vegetables): in general, a "healthy" diet  , well balanced, diabetic, low fat/  cholesterol, low salt eats a variety of fruits and vegetables daily, limits salt, fat/cholesterol, sugar, caffeine, drinks 6-8 glasses of water daily.   Goals      Patient Stated   . patient (pt-stated)          Wants to live to see 13 and 83 year old grow up. Keep exercising; keep moving; keep busy      Other   . Continue to do my  normal activities, enjoy family and church      Fall Risk Fall Risk  09/11/2016 08/11/2016 07/03/2015 07/06/2014 09/14/2012  Falls in the past year? No No No No No   Depression Screen PHQ 2/9 Scores 09/11/2016 07/03/2015 07/06/2014 09/14/2012  PHQ - 2 Score 0 0 0 0     Cognitive Function MMSE - Mini Mental State Exam 09/11/2016 07/03/2015  Not completed: - (No Data)  Orientation to time 5 -  Orientation to Place 5 -  Registration 3 -  Attention/ Calculation 3 -  Recall 2 -  Language- name 2 objects 2 -  Language- repeat 1 -  Language- follow 3 step command 3 -  Language- read & follow direction 1 -  Write a sentence 1 -  Copy design 1 -  Total score 27 -        Immunization History  Administered Date(s) Administered  . Influenza Split 01/13/2011,  12/30/2011, 02/01/2013  . Influenza Whole 12/21/2008, 01/12/2010  . Influenza, High Dose Seasonal PF 12/09/2013, 12/11/2015  . Influenza,inj,Quad PF,36+ Mos 01/23/2015  . Pneumococcal Conjugate-13 04/21/2014  . Pneumococcal-Unspecified 02/25/2005   Screening Tests Health Maintenance  Topic Date Due  . FOOT EXAM  12/17/1934  . TETANUS/TDAP  12/17/1943  . INFLUENZA VACCINE  11/12/2016  . OPHTHALMOLOGY EXAM  11/22/2016  . HEMOGLOBIN A1C  01/04/2017  . PNA vac Low Risk Adult  Completed  . DEXA SCAN  Excluded      Plan:    Continue to eat heart healthy diet (full of fruits, vegetables, whole grains, lean protein, water--limit salt, fat, and sugar intake) and increase physical activity as tolerated.  Continue doing brain stimulating activities (puzzles, reading, adult coloring books, staying active) to keep memory sharp.    I have personally reviewed and noted the following in the patient's chart:   . Medical and social history . Use of alcohol, tobacco or illicit drugs  . Current medications and supplements . Functional ability and status . Nutritional status . Physical activity . Advanced directives . List of other physicians . Vitals . Screenings to include cognitive, depression, and falls . Referrals and appointments  In addition, I have reviewed and discussed with patient certain preventive protocols, quality metrics, and best practice recommendations. A written personalized care plan for preventive services as well as general preventive health recommendations were provided to patient.     Michiel Cowboy, RN  09/11/2016   Medical screening examination/treatment/procedure(s) were performed by non-physician practitioner and as supervising physician I was immediately available for consultation/collaboration. I agree with above. Cathlean Cower, MD

## 2016-09-11 NOTE — Progress Notes (Signed)
Pre visit review using our clinic review tool, if applicable. No additional management support is needed unless otherwise documented below in the visit note. 

## 2016-09-17 ENCOUNTER — Ambulatory Visit (INDEPENDENT_AMBULATORY_CARE_PROVIDER_SITE_OTHER): Payer: MEDICARE | Admitting: Internal Medicine

## 2016-09-17 ENCOUNTER — Encounter: Payer: Self-pay | Admitting: Internal Medicine

## 2016-09-17 DIAGNOSIS — I6522 Occlusion and stenosis of left carotid artery: Secondary | ICD-10-CM | POA: Diagnosis not present

## 2016-09-17 DIAGNOSIS — D649 Anemia, unspecified: Secondary | ICD-10-CM

## 2016-09-17 DIAGNOSIS — E785 Hyperlipidemia, unspecified: Secondary | ICD-10-CM

## 2016-09-17 DIAGNOSIS — E034 Atrophy of thyroid (acquired): Secondary | ICD-10-CM | POA: Diagnosis not present

## 2016-09-17 DIAGNOSIS — R7309 Other abnormal glucose: Secondary | ICD-10-CM

## 2016-09-17 MED ORDER — GLUCOSE BLOOD VI STRP: ORAL_STRIP | 3 refills | Status: DC

## 2016-09-17 MED ORDER — GLUCOSE BLOOD VI STRP: ORAL_STRIP | 5 refills | Status: DC

## 2016-09-17 MED ORDER — ONETOUCH ULTRASOFT LANCETS MISC: 3 refills | Status: DC

## 2016-09-17 MED ORDER — LYRICA 75 MG PO CAPS: 75.0000 mg | ORAL_CAPSULE | Freq: Two times a day (BID) | ORAL | 1 refills | Status: DC

## 2016-09-17 NOTE — Assessment & Plan Note (Signed)
CBC

## 2016-09-17 NOTE — Patient Instructions (Signed)
Well [facility] w/Jill 

## 2016-09-17 NOTE — Assessment & Plan Note (Signed)
Simvastatin 

## 2016-09-17 NOTE — Progress Notes (Signed)
Subjective:  Patient ID: Kayla Davis, female    DOB: 16-Apr-1922  Age: 83 y.o. MRN: [id number]  CC: No chief complaint on file.   HPI ARWILDA GEORGIA presents for B12, DM, HTN and neuropathy f/u  Outpatient Medications Prior to Visit  Medication Sig Dispense Refill  . ALPHA LIPOIC ACID PO Take 1 tablet by mouth daily.    [name] aspirin 325 MG tablet Take 325 mg by mouth daily.    [name] CRANBERRY PO Take 1 tablet by mouth daily with supper.    . Cyanocobalamin (VITAMIN B-12 PO) Take 1 tablet by mouth at bedtime.    [name] Calcium (STOOL SOFTENER PO) Take 2 capsules by mouth at bedtime.    [name] EVENING PRIMROSE OIL PO Take 1 capsule by mouth daily.    . fexofenadine (ALLEGRA) 30 MG tablet Take 30 mg by mouth 2 (two) times daily.    . fluticasone (FLONASE ALLERGY RELIEF) 50 MCG/ACT nasal spray Place 1 spray into both nostrils daily.     . hydrochlorothiazide (HYDRODIURIL) 12.5 MG tablet Take 1 tablet (12.5 mg total) by mouth at bedtime. 30 tablet 11  . ipratropium (ATROVENT) 0.06 % nasal spray Place 2 sprays into both nostrils 3 (three) times daily. 45 mL 1  . Lancets (ONETOUCH ULTRASOFT) lancets Use to check blood sugars once a day Dx E11.9 100 each 3  . levothyroxine (SYNTHROID, LEVOTHROID) 100 MCG tablet TAKE 1 TABLET BY MOUTH EVERY DAY BEFORE BREAKFAST 30 tablet 5  . losartan (COZAAR) 25 MG tablet Take 1 tablet (25 mg total) by mouth 2 (two) times daily. 60 tablet 3  . LYRICA 75 MG capsule Take 75 mg by mouth 2 (two) times daily.    . Multiple Vitamin (MULTIVITAMIN WITH MINERALS) TABS tablet Take 1 tablet by mouth daily with supper.    . Multiple Vitamins-Minerals (EYE VITAMINS) CAPS Take 1 capsule by mouth daily.    . ONE TOUCH ULTRA TEST test strip TEST ONCE DAILY AS DIRECTED 50 each 5  . Polyethyl [name] (SYSTANE OP) Apply to eye daily.    . simvastatin (ZOCOR) 40 MG tablet TAKE 1 TABLET DAILY WITH   SUPPER 90 tablet 1  . metoprolol tartrate (LOPRESSOR) 25 MG tablet  Take 1 tablet (25 mg total) by mouth 2 (two) times daily. 60 tablet 6  . aspirin 325 MG tablet Take 325 mg by mouth daily.     No facility-administered medications prior to visit.     ROS Review of Systems  Constitutional: Negative for activity change, appetite change, chills, fatigue and unexpected weight change.  HENT: Negative for congestion, mouth sores and sinus pressure.   Eyes: Negative for visual disturbance.  Respiratory: Negative for cough and chest tightness.   Gastrointestinal: Negative for abdominal pain and nausea.  Genitourinary: Negative for difficulty urinating, frequency and vaginal pain.  Musculoskeletal: Positive for arthralgias and gait problem. Negative for back pain.  Skin: Negative for pallor and rash.  Neurological: Positive for weakness. Negative for dizziness, tremors, numbness and headaches.  Psychiatric/Behavioral: Negative for confusion and sleep disturbance.    Objective:  BP 117/70 (BP Location: Left Arm, Patient Position: Sitting, Cuff Size: Normal)   Pulse 64   Temp 98 F (36.7 C) (Oral)   Ht 5\' 5"  (1.651 m)   Wt 150 lb (68 kg)   LMP  (LMP Unknown)   SpO2 98%   BMI 24.96 kg/m   BP Readings from Last 3 Encounters:  09/17/16 117/70  09/11/16 [name])  118/58  08/11/16 113/66    Wt Readings from Last 3 Encounters:  09/17/16 150 lb (68 kg)  09/11/16 151 lb (68.5 kg)  08/11/16 149 lb 12.8 oz (67.9 kg)    Physical Exam  Constitutional: She appears well-developed. No distress.  HENT:  Head: Normocephalic.  Right Ear: External ear normal.  Left Ear: External ear normal.  Nose: Nose normal.  Mouth/Throat: Oropharynx is clear and moist.  Eyes: Conjunctivae are normal. Pupils are equal, round, and reactive to light. Right eye exhibits no discharge. Left eye exhibits no discharge.  Neck: Normal range of motion. Neck supple. No JVD present. No tracheal deviation present. No thyromegaly present.  Cardiovascular: Normal rate, regular rhythm and  normal heart sounds.   Pulmonary/Chest: No stridor. No respiratory distress. She has no wheezes.  Abdominal: Soft. Bowel sounds are normal. She exhibits no distension and no mass. There is no tenderness. There is no rebound and no guarding.  Musculoskeletal: She exhibits tenderness. She exhibits no edema.  Lymphadenopathy:    She has no cervical adenopathy.  Neurological: She displays normal reflexes. No cranial nerve deficit. She exhibits normal muscle tone. Coordination abnormal.  Skin: No rash noted. No erythema.  Psychiatric: She has a normal mood and affect. Her behavior is normal. Judgment and thought content normal.  walker  Lab Results  Component Value Date   WBC 4.9 07/15/2016   HGB 11.8 (L) 07/15/2016   HCT 35.8 (L) 07/15/2016   PLT 186 07/15/2016   GLUCOSE 210 (H) 07/15/2016   CHOL 145 07/04/2016   TRIG 71 07/04/2016   HDL 54 07/04/2016   LDLDIRECT 143.0 10/07/2007   LDLCALC 77 07/04/2016   ALT 24 07/03/2016   AST 24 07/03/2016   NA 135 07/15/2016   K 4.3 07/15/2016   CL 101 07/15/2016   CREATININE 0.85 07/15/2016   BUN 15 07/15/2016   CO2 25 07/15/2016   TSH 0.195 (L) 07/04/2016   INR 1.06 07/03/2016   HGBA1C 6.2 (H) 07/04/2016    No results found.  Assessment & Plan:   There are no diagnoses linked to this encounter. I am having Ms. Quentin Cornwall maintain her fluticasone, ONE TOUCH ULTRA TEST, onetouch ultrasoft, ipratropium, ALPHA LIPOIC ACID PO, EVENING PRIMROSE OIL PO, EYE VITAMINS, CRANBERRY PO, Docusate Calcium (STOOL SOFTENER PO), Cyanocobalamin (VITAMIN B-12 PO), multivitamin with minerals, metoprolol tartrate, LYRICA, losartan, hydrochlorothiazide, simvastatin, levothyroxine, fexofenadine, aspirin, and Polyethyl [name] (SYSTANE OP).  No orders of the defined types were placed in this encounter.    Follow-up: No Follow-up on file.  [name], MD

## 2016-09-17 NOTE — Assessment & Plan Note (Signed)
Labs in 3 mo

## 2016-09-17 NOTE — Assessment & Plan Note (Signed)
Labs

## 2016-09-23 ENCOUNTER — Telehealth: Payer: Self-pay | Admitting: Internal Medicine

## 2016-09-23 NOTE — Telephone Encounter (Signed)
Patient states she dropped a bill off to Dr. Alain Marion for $36.00 for labs that insurance did not pay on 2/13.  Patient states she paid this bill.  Patient states Dr. Alain Marion was to give the bill to bookkeeping to get refunded.  Did you get this bill by chance?

## 2016-09-26 NOTE — Telephone Encounter (Signed)
Please let patient know I have contacted billing and requested this be resubmitted to insurance with some coding changes. Medicare has reduced their lab coverage, so I cannot be sure this will be paid. These labs were done days before the visit, and that tends to complicate the coverage.

## 2016-09-26 NOTE — Telephone Encounter (Signed)
Notified patient that we have submitted and that we would follow up in regard once we have received notice back from medicare.

## 2016-10-02 ENCOUNTER — Encounter (HOSPITAL_COMMUNITY): Payer: Self-pay

## 2016-10-02 ENCOUNTER — Emergency Department (HOSPITAL_COMMUNITY): Payer: No Typology Code available for payment source

## 2016-10-02 ENCOUNTER — Emergency Department (HOSPITAL_COMMUNITY)
Admission: EM | Admit: 2016-10-02 | Discharge: 2016-10-02 | Disposition: A | Discharge status: Home / Self Care | Payer: No Typology Code available for payment source | Attending: Emergency Medicine | Admitting: Emergency Medicine

## 2016-10-02 DIAGNOSIS — R9431 Abnormal electrocardiogram [ECG] [EKG]: Secondary | ICD-10-CM | POA: Diagnosis not present

## 2016-10-02 DIAGNOSIS — Y999 Unspecified external cause status: Secondary | ICD-10-CM | POA: Diagnosis not present

## 2016-10-02 DIAGNOSIS — E119 Type 2 diabetes mellitus without complications: Secondary | ICD-10-CM | POA: Insufficient documentation

## 2016-10-02 DIAGNOSIS — D649 Anemia, unspecified: Secondary | ICD-10-CM | POA: Insufficient documentation

## 2016-10-02 DIAGNOSIS — I10 Essential (primary) hypertension: Secondary | ICD-10-CM | POA: Diagnosis not present

## 2016-10-02 DIAGNOSIS — Y929 Unspecified place or not applicable: Secondary | ICD-10-CM | POA: Diagnosis not present

## 2016-10-02 DIAGNOSIS — S0990XA Unspecified injury of head, initial encounter: Secondary | ICD-10-CM | POA: Diagnosis not present

## 2016-10-02 DIAGNOSIS — Z79899 Other long term (current) drug therapy: Secondary | ICD-10-CM | POA: Insufficient documentation

## 2016-10-02 DIAGNOSIS — S0993XA Unspecified injury of face, initial encounter: Secondary | ICD-10-CM | POA: Diagnosis not present

## 2016-10-02 DIAGNOSIS — E039 Hypothyroidism, unspecified: Secondary | ICD-10-CM | POA: Insufficient documentation

## 2016-10-02 DIAGNOSIS — M542 Cervicalgia: Secondary | ICD-10-CM | POA: Diagnosis not present

## 2016-10-02 DIAGNOSIS — Y939 Activity, unspecified: Secondary | ICD-10-CM | POA: Diagnosis not present

## 2016-10-02 DIAGNOSIS — R51 Headache: Secondary | ICD-10-CM | POA: Diagnosis not present

## 2016-10-02 DIAGNOSIS — S199XXA Unspecified injury of neck, initial encounter: Secondary | ICD-10-CM | POA: Diagnosis not present

## 2016-10-02 MED ORDER — LOSARTAN POTASSIUM 25 MG PO TABS
25.0000 mg | ORAL_TABLET | Freq: Once | ORAL | Status: AC
  Administered 2016-10-02: 25 mg via ORAL
  Filled 2016-10-02: qty 1

## 2016-10-02 MED ORDER — METOPROLOL TARTRATE 25 MG PO TABS
25.0000 mg | ORAL_TABLET | Freq: Once | ORAL | Status: AC
  Administered 2016-10-02: 25 mg via ORAL
  Filled 2016-10-02: qty 1

## 2016-10-02 MED ORDER — HYDROCHLOROTHIAZIDE 12.5 MG PO CAPS
12.5000 mg | ORAL_CAPSULE | Freq: Every day | ORAL | Status: DC
  Administered 2016-10-02: 12.5 mg via ORAL
  Filled 2016-10-02: qty 1

## 2016-10-02 NOTE — ED Notes (Signed)
Pt brought back from CT via bed

## 2016-10-02 NOTE — ED Notes (Signed)
EDP made aware of patient's blood pressure.  

## 2016-10-02 NOTE — ED Notes (Signed)
Pt taken to CT via bed

## 2016-10-02 NOTE — Discharge Instructions (Signed)
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
## 2016-10-06 ENCOUNTER — Other Ambulatory Visit: Payer: Self-pay

## 2016-10-06 MED ORDER — LEVOTHYROXINE SODIUM 100 MCG PO TABS: 100.0000 ug | ORAL_TABLET | Freq: Every day | ORAL | 1 refills | Status: DC

## 2016-10-09 ENCOUNTER — Other Ambulatory Visit: Payer: Self-pay

## 2016-10-09 MED ORDER — GLUCOSE BLOOD VI STRP: ORAL_STRIP | 5 refills | Status: DC

## 2016-10-14 NOTE — Telephone Encounter (Signed)
$  36.00 from 05/27/16 still with insurance, will continue to monitor.

## 2016-10-16 ENCOUNTER — Ambulatory Visit (INDEPENDENT_AMBULATORY_CARE_PROVIDER_SITE_OTHER): Payer: MEDICARE | Admitting: *Deleted

## 2016-10-16 ENCOUNTER — Telehealth: Payer: Self-pay | Admitting: Cardiology

## 2016-10-16 DIAGNOSIS — I495 Sick sinus syndrome: Secondary | ICD-10-CM | POA: Diagnosis not present

## 2016-10-16 NOTE — Telephone Encounter (Signed)
Spoke with pt and reminded pt of remote transmission that is due today. Pt verbalized understanding.   

## 2016-10-17 LAB — CUP PACEART REMOTE DEVICE CHECK
Battery Impedance: 520 Ohm
Battery Remaining Longevity: 87 mo
Battery Voltage: 2.78 V
Brady Statistic AP VP Percent: 1 %
Brady Statistic AP VS Percent: 97 %
Brady Statistic AS VS Percent: 2 %
Date Time Interrogation Session: 20180705224815
Implantable Lead Implant Date: 20111031
Implantable Lead Implant Date: 20111031
Implantable Lead Location: 753859
Implantable Lead Model: 4469
Implantable Pulse Generator Implant Date: 20111031
Lead Channel Impedance Value: 507 Ohm
Lead Channel Pacing Threshold Amplitude: 0.5 V
Lead Channel Sensing Intrinsic Amplitude: 11.2 mV
Lead Channel Setting Pacing Amplitude: 2 V
Lead Channel Setting Pacing Amplitude: 2.5 V
MDC IDC LEAD LOCATION: 753860
MDC IDC LEAD SERIAL: 548557
MDC IDC LEAD SERIAL: 686076
MDC IDC MSMT LEADCHNL RA PACING THRESHOLD PULSEWIDTH: 0.4 ms
MDC IDC MSMT LEADCHNL RV IMPEDANCE VALUE: 533 Ohm
MDC IDC MSMT LEADCHNL RV PACING THRESHOLD AMPLITUDE: 0.625 V
MDC IDC MSMT LEADCHNL RV PACING THRESHOLD PULSEWIDTH: 0.4 ms
MDC IDC SET LEADCHNL RV PACING PULSEWIDTH: 0.4 ms
MDC IDC SET LEADCHNL RV SENSING SENSITIVITY: 4 mV
MDC IDC STAT BRADY AS VP PERCENT: 0 %

## 2016-10-17 NOTE — Progress Notes (Signed)
Remote pacemaker transmission.   

## 2016-10-22 ENCOUNTER — Other Ambulatory Visit: Payer: Self-pay

## 2016-10-22 DIAGNOSIS — I10 Essential (primary) hypertension: Secondary | ICD-10-CM

## 2016-10-22 MED ORDER — LOSARTAN POTASSIUM 25 MG PO TABS: 25.0000 mg | ORAL_TABLET | Freq: Two times a day (BID) | ORAL | 1 refills | Status: DC

## 2016-10-24 ENCOUNTER — Encounter: Payer: Self-pay | Admitting: Cardiology

## 2016-10-27 DIAGNOSIS — M5136 Other intervertebral disc degeneration, lumbar region: Secondary | ICD-10-CM | POA: Diagnosis not present

## 2016-10-27 DIAGNOSIS — M4187 Other forms of scoliosis, lumbosacral region: Secondary | ICD-10-CM | POA: Diagnosis not present

## 2016-10-27 DIAGNOSIS — M47816 Spondylosis without myelopathy or radiculopathy, lumbar region: Secondary | ICD-10-CM | POA: Diagnosis not present

## 2016-10-27 DIAGNOSIS — M4686 Other specified inflammatory spondylopathies, lumbar region: Secondary | ICD-10-CM | POA: Diagnosis not present

## 2016-10-28 DIAGNOSIS — M47816 Spondylosis without myelopathy or radiculopathy, lumbar region: Secondary | ICD-10-CM | POA: Diagnosis not present

## 2016-10-28 DIAGNOSIS — I10 Essential (primary) hypertension: Secondary | ICD-10-CM | POA: Diagnosis not present

## 2016-10-28 DIAGNOSIS — M5136 Other intervertebral disc degeneration, lumbar region: Secondary | ICD-10-CM | POA: Diagnosis not present

## 2016-10-28 DIAGNOSIS — M4696 Unspecified inflammatory spondylopathy, lumbar region: Secondary | ICD-10-CM | POA: Diagnosis not present

## 2016-10-28 DIAGNOSIS — M419 Scoliosis, unspecified: Secondary | ICD-10-CM | POA: Diagnosis not present

## 2016-10-31 DIAGNOSIS — I10 Essential (primary) hypertension: Secondary | ICD-10-CM | POA: Diagnosis not present

## 2016-10-31 DIAGNOSIS — M5136 Other intervertebral disc degeneration, lumbar region: Secondary | ICD-10-CM | POA: Diagnosis not present

## 2016-10-31 DIAGNOSIS — M4696 Unspecified inflammatory spondylopathy, lumbar region: Secondary | ICD-10-CM | POA: Diagnosis not present

## 2016-10-31 DIAGNOSIS — M47816 Spondylosis without myelopathy or radiculopathy, lumbar region: Secondary | ICD-10-CM | POA: Diagnosis not present

## 2016-10-31 DIAGNOSIS — M419 Scoliosis, unspecified: Secondary | ICD-10-CM | POA: Diagnosis not present

## 2016-11-03 ENCOUNTER — Telehealth: Payer: Self-pay | Admitting: Internal Medicine

## 2016-11-03 DIAGNOSIS — M4696 Unspecified inflammatory spondylopathy, lumbar region: Secondary | ICD-10-CM | POA: Diagnosis not present

## 2016-11-03 DIAGNOSIS — M419 Scoliosis, unspecified: Secondary | ICD-10-CM | POA: Diagnosis not present

## 2016-11-03 DIAGNOSIS — M47816 Spondylosis without myelopathy or radiculopathy, lumbar region: Secondary | ICD-10-CM | POA: Diagnosis not present

## 2016-11-03 DIAGNOSIS — I10 Essential (primary) hypertension: Secondary | ICD-10-CM | POA: Diagnosis not present

## 2016-11-03 DIAGNOSIS — M5136 Other intervertebral disc degeneration, lumbar region: Secondary | ICD-10-CM | POA: Diagnosis not present

## 2016-11-03 NOTE — Telephone Encounter (Signed)
New message    Pt is calling asking about the results of her transmission.

## 2016-11-05 DIAGNOSIS — M47816 Spondylosis without myelopathy or radiculopathy, lumbar region: Secondary | ICD-10-CM | POA: Diagnosis not present

## 2016-11-05 DIAGNOSIS — M419 Scoliosis, unspecified: Secondary | ICD-10-CM | POA: Diagnosis not present

## 2016-11-05 DIAGNOSIS — M5136 Other intervertebral disc degeneration, lumbar region: Secondary | ICD-10-CM | POA: Diagnosis not present

## 2016-11-05 DIAGNOSIS — M4696 Unspecified inflammatory spondylopathy, lumbar region: Secondary | ICD-10-CM | POA: Diagnosis not present

## 2016-11-05 DIAGNOSIS — I10 Essential (primary) hypertension: Secondary | ICD-10-CM | POA: Diagnosis not present

## 2016-11-05 NOTE — Telephone Encounter (Signed)
$  36.00 balance from 05/27/16 has been paid by insurance with corrected claim.

## 2016-11-05 NOTE — Telephone Encounter (Signed)
Left patient vm °

## 2016-11-10 DIAGNOSIS — M4696 Unspecified inflammatory spondylopathy, lumbar region: Secondary | ICD-10-CM | POA: Diagnosis not present

## 2016-11-10 DIAGNOSIS — M47816 Spondylosis without myelopathy or radiculopathy, lumbar region: Secondary | ICD-10-CM | POA: Diagnosis not present

## 2016-11-10 DIAGNOSIS — M5136 Other intervertebral disc degeneration, lumbar region: Secondary | ICD-10-CM | POA: Diagnosis not present

## 2016-11-10 DIAGNOSIS — M419 Scoliosis, unspecified: Secondary | ICD-10-CM | POA: Diagnosis not present

## 2016-11-10 DIAGNOSIS — I10 Essential (primary) hypertension: Secondary | ICD-10-CM | POA: Diagnosis not present

## 2016-11-12 DIAGNOSIS — M4696 Unspecified inflammatory spondylopathy, lumbar region: Secondary | ICD-10-CM | POA: Diagnosis not present

## 2016-11-12 DIAGNOSIS — M419 Scoliosis, unspecified: Secondary | ICD-10-CM | POA: Diagnosis not present

## 2016-11-12 DIAGNOSIS — M5136 Other intervertebral disc degeneration, lumbar region: Secondary | ICD-10-CM | POA: Diagnosis not present

## 2016-11-12 DIAGNOSIS — I10 Essential (primary) hypertension: Secondary | ICD-10-CM | POA: Diagnosis not present

## 2016-11-12 DIAGNOSIS — M47816 Spondylosis without myelopathy or radiculopathy, lumbar region: Secondary | ICD-10-CM | POA: Diagnosis not present

## 2016-11-17 DIAGNOSIS — I10 Essential (primary) hypertension: Secondary | ICD-10-CM | POA: Diagnosis not present

## 2016-11-17 DIAGNOSIS — M5136 Other intervertebral disc degeneration, lumbar region: Secondary | ICD-10-CM | POA: Diagnosis not present

## 2016-11-17 DIAGNOSIS — M47816 Spondylosis without myelopathy or radiculopathy, lumbar region: Secondary | ICD-10-CM | POA: Diagnosis not present

## 2016-11-17 DIAGNOSIS — M4696 Unspecified inflammatory spondylopathy, lumbar region: Secondary | ICD-10-CM | POA: Diagnosis not present

## 2016-11-17 DIAGNOSIS — M419 Scoliosis, unspecified: Secondary | ICD-10-CM | POA: Diagnosis not present

## 2016-11-19 DIAGNOSIS — M47816 Spondylosis without myelopathy or radiculopathy, lumbar region: Secondary | ICD-10-CM | POA: Diagnosis not present

## 2016-11-19 DIAGNOSIS — M419 Scoliosis, unspecified: Secondary | ICD-10-CM | POA: Diagnosis not present

## 2016-11-19 DIAGNOSIS — M5136 Other intervertebral disc degeneration, lumbar region: Secondary | ICD-10-CM | POA: Diagnosis not present

## 2016-11-19 DIAGNOSIS — M4696 Unspecified inflammatory spondylopathy, lumbar region: Secondary | ICD-10-CM | POA: Diagnosis not present

## 2016-11-19 DIAGNOSIS — I10 Essential (primary) hypertension: Secondary | ICD-10-CM | POA: Diagnosis not present

## 2016-11-24 DIAGNOSIS — M419 Scoliosis, unspecified: Secondary | ICD-10-CM | POA: Diagnosis not present

## 2016-11-24 DIAGNOSIS — M5136 Other intervertebral disc degeneration, lumbar region: Secondary | ICD-10-CM | POA: Diagnosis not present

## 2016-11-24 DIAGNOSIS — M47816 Spondylosis without myelopathy or radiculopathy, lumbar region: Secondary | ICD-10-CM | POA: Diagnosis not present

## 2016-11-24 DIAGNOSIS — M4696 Unspecified inflammatory spondylopathy, lumbar region: Secondary | ICD-10-CM | POA: Diagnosis not present

## 2016-11-24 DIAGNOSIS — I10 Essential (primary) hypertension: Secondary | ICD-10-CM | POA: Diagnosis not present

## 2016-11-26 DIAGNOSIS — H04123 Dry eye syndrome of bilateral lacrimal glands: Secondary | ICD-10-CM | POA: Diagnosis not present

## 2016-11-26 DIAGNOSIS — H52203 Unspecified astigmatism, bilateral: Secondary | ICD-10-CM | POA: Diagnosis not present

## 2016-11-26 DIAGNOSIS — E119 Type 2 diabetes mellitus without complications: Secondary | ICD-10-CM | POA: Diagnosis not present

## 2016-11-26 LAB — HM DIABETES EYE EXAM

## 2016-11-27 DIAGNOSIS — M4696 Unspecified inflammatory spondylopathy, lumbar region: Secondary | ICD-10-CM | POA: Diagnosis not present

## 2016-11-27 DIAGNOSIS — M5136 Other intervertebral disc degeneration, lumbar region: Secondary | ICD-10-CM | POA: Diagnosis not present

## 2016-11-27 DIAGNOSIS — I10 Essential (primary) hypertension: Secondary | ICD-10-CM | POA: Diagnosis not present

## 2016-11-27 DIAGNOSIS — M419 Scoliosis, unspecified: Secondary | ICD-10-CM | POA: Diagnosis not present

## 2016-11-27 DIAGNOSIS — M47816 Spondylosis without myelopathy or radiculopathy, lumbar region: Secondary | ICD-10-CM | POA: Diagnosis not present

## 2016-12-01 ENCOUNTER — Encounter: Payer: Self-pay | Admitting: Internal Medicine

## 2016-12-01 DIAGNOSIS — I10 Essential (primary) hypertension: Secondary | ICD-10-CM | POA: Diagnosis not present

## 2016-12-01 DIAGNOSIS — M47816 Spondylosis without myelopathy or radiculopathy, lumbar region: Secondary | ICD-10-CM | POA: Diagnosis not present

## 2016-12-01 DIAGNOSIS — M4696 Unspecified inflammatory spondylopathy, lumbar region: Secondary | ICD-10-CM | POA: Diagnosis not present

## 2016-12-01 DIAGNOSIS — M5136 Other intervertebral disc degeneration, lumbar region: Secondary | ICD-10-CM | POA: Diagnosis not present

## 2016-12-01 DIAGNOSIS — M419 Scoliosis, unspecified: Secondary | ICD-10-CM | POA: Diagnosis not present

## 2016-12-01 NOTE — Progress Notes (Signed)
Abstracted and sent to scan  

## 2016-12-03 DIAGNOSIS — I10 Essential (primary) hypertension: Secondary | ICD-10-CM | POA: Diagnosis not present

## 2016-12-03 DIAGNOSIS — M419 Scoliosis, unspecified: Secondary | ICD-10-CM | POA: Diagnosis not present

## 2016-12-03 DIAGNOSIS — M47816 Spondylosis without myelopathy or radiculopathy, lumbar region: Secondary | ICD-10-CM | POA: Diagnosis not present

## 2016-12-03 DIAGNOSIS — M5136 Other intervertebral disc degeneration, lumbar region: Secondary | ICD-10-CM | POA: Diagnosis not present

## 2016-12-03 DIAGNOSIS — M4696 Unspecified inflammatory spondylopathy, lumbar region: Secondary | ICD-10-CM | POA: Diagnosis not present

## 2016-12-08 DIAGNOSIS — M47816 Spondylosis without myelopathy or radiculopathy, lumbar region: Secondary | ICD-10-CM | POA: Diagnosis not present

## 2016-12-08 DIAGNOSIS — M4696 Unspecified inflammatory spondylopathy, lumbar region: Secondary | ICD-10-CM | POA: Diagnosis not present

## 2016-12-08 DIAGNOSIS — I10 Essential (primary) hypertension: Secondary | ICD-10-CM | POA: Diagnosis not present

## 2016-12-08 DIAGNOSIS — M419 Scoliosis, unspecified: Secondary | ICD-10-CM | POA: Diagnosis not present

## 2016-12-08 DIAGNOSIS — M5136 Other intervertebral disc degeneration, lumbar region: Secondary | ICD-10-CM | POA: Diagnosis not present

## 2016-12-10 DIAGNOSIS — M419 Scoliosis, unspecified: Secondary | ICD-10-CM | POA: Diagnosis not present

## 2016-12-10 DIAGNOSIS — M47816 Spondylosis without myelopathy or radiculopathy, lumbar region: Secondary | ICD-10-CM | POA: Diagnosis not present

## 2016-12-10 DIAGNOSIS — M4696 Unspecified inflammatory spondylopathy, lumbar region: Secondary | ICD-10-CM | POA: Diagnosis not present

## 2016-12-10 DIAGNOSIS — M5136 Other intervertebral disc degeneration, lumbar region: Secondary | ICD-10-CM | POA: Diagnosis not present

## 2016-12-10 DIAGNOSIS — I10 Essential (primary) hypertension: Secondary | ICD-10-CM | POA: Diagnosis not present

## 2016-12-17 DIAGNOSIS — M4696 Unspecified inflammatory spondylopathy, lumbar region: Secondary | ICD-10-CM | POA: Diagnosis not present

## 2016-12-17 DIAGNOSIS — M47816 Spondylosis without myelopathy or radiculopathy, lumbar region: Secondary | ICD-10-CM | POA: Diagnosis not present

## 2016-12-17 DIAGNOSIS — M5136 Other intervertebral disc degeneration, lumbar region: Secondary | ICD-10-CM | POA: Diagnosis not present

## 2016-12-17 DIAGNOSIS — I10 Essential (primary) hypertension: Secondary | ICD-10-CM | POA: Diagnosis not present

## 2016-12-17 DIAGNOSIS — M419 Scoliosis, unspecified: Secondary | ICD-10-CM | POA: Diagnosis not present

## 2016-12-18 DIAGNOSIS — M5136 Other intervertebral disc degeneration, lumbar region: Secondary | ICD-10-CM | POA: Diagnosis not present

## 2016-12-18 DIAGNOSIS — M419 Scoliosis, unspecified: Secondary | ICD-10-CM | POA: Diagnosis not present

## 2016-12-18 DIAGNOSIS — M4696 Unspecified inflammatory spondylopathy, lumbar region: Secondary | ICD-10-CM | POA: Diagnosis not present

## 2016-12-18 DIAGNOSIS — I10 Essential (primary) hypertension: Secondary | ICD-10-CM | POA: Diagnosis not present

## 2016-12-18 DIAGNOSIS — M47816 Spondylosis without myelopathy or radiculopathy, lumbar region: Secondary | ICD-10-CM | POA: Diagnosis not present

## 2016-12-22 DIAGNOSIS — M4696 Unspecified inflammatory spondylopathy, lumbar region: Secondary | ICD-10-CM | POA: Diagnosis not present

## 2016-12-22 DIAGNOSIS — M47816 Spondylosis without myelopathy or radiculopathy, lumbar region: Secondary | ICD-10-CM | POA: Diagnosis not present

## 2016-12-22 DIAGNOSIS — I10 Essential (primary) hypertension: Secondary | ICD-10-CM | POA: Diagnosis not present

## 2016-12-22 DIAGNOSIS — M5136 Other intervertebral disc degeneration, lumbar region: Secondary | ICD-10-CM | POA: Diagnosis not present

## 2016-12-22 DIAGNOSIS — M419 Scoliosis, unspecified: Secondary | ICD-10-CM | POA: Diagnosis not present

## 2016-12-24 ENCOUNTER — Other Ambulatory Visit: Payer: Self-pay

## 2016-12-24 ENCOUNTER — Encounter: Payer: Self-pay | Admitting: Internal Medicine

## 2016-12-24 ENCOUNTER — Other Ambulatory Visit (INDEPENDENT_AMBULATORY_CARE_PROVIDER_SITE_OTHER): Payer: MEDICARE

## 2016-12-24 ENCOUNTER — Ambulatory Visit (INDEPENDENT_AMBULATORY_CARE_PROVIDER_SITE_OTHER): Payer: MEDICARE | Admitting: Internal Medicine

## 2016-12-24 VITALS — BP 112/54 | HR 61 | Temp 97.5°F | Ht 65.0 in | Wt 154.0 lb

## 2016-12-24 DIAGNOSIS — E034 Atrophy of thyroid (acquired): Secondary | ICD-10-CM

## 2016-12-24 DIAGNOSIS — Z23 Encounter for immunization: Secondary | ICD-10-CM

## 2016-12-24 DIAGNOSIS — R269 Unspecified abnormalities of gait and mobility: Secondary | ICD-10-CM | POA: Diagnosis not present

## 2016-12-24 DIAGNOSIS — E785 Hyperlipidemia, unspecified: Secondary | ICD-10-CM | POA: Diagnosis not present

## 2016-12-24 DIAGNOSIS — M419 Scoliosis, unspecified: Secondary | ICD-10-CM | POA: Diagnosis not present

## 2016-12-24 DIAGNOSIS — M4696 Unspecified inflammatory spondylopathy, lumbar region: Secondary | ICD-10-CM | POA: Diagnosis not present

## 2016-12-24 DIAGNOSIS — I6522 Occlusion and stenosis of left carotid artery: Secondary | ICD-10-CM | POA: Diagnosis not present

## 2016-12-24 DIAGNOSIS — M5136 Other intervertebral disc degeneration, lumbar region: Secondary | ICD-10-CM | POA: Diagnosis not present

## 2016-12-24 DIAGNOSIS — Z87440 Personal history of urinary (tract) infections: Secondary | ICD-10-CM

## 2016-12-24 DIAGNOSIS — E119 Type 2 diabetes mellitus without complications: Secondary | ICD-10-CM

## 2016-12-24 DIAGNOSIS — D649 Anemia, unspecified: Secondary | ICD-10-CM

## 2016-12-24 DIAGNOSIS — I10 Essential (primary) hypertension: Secondary | ICD-10-CM | POA: Diagnosis not present

## 2016-12-24 DIAGNOSIS — R7309 Other abnormal glucose: Secondary | ICD-10-CM | POA: Diagnosis not present

## 2016-12-24 DIAGNOSIS — M47816 Spondylosis without myelopathy or radiculopathy, lumbar region: Secondary | ICD-10-CM | POA: Diagnosis not present

## 2016-12-24 LAB — URINALYSIS, ROUTINE W REFLEX MICROSCOPIC
Bilirubin Urine: NEGATIVE
Nitrite: POSITIVE — AB
SPECIFIC GRAVITY, URINE: 1.01 (ref 1.000–1.030)
Total Protein, Urine: NEGATIVE
URINE GLUCOSE: NEGATIVE
UROBILINOGEN UA: 0.2 (ref 0.0–1.0)
pH: 7.5 (ref 5.0–8.0)

## 2016-12-24 LAB — CBC WITH DIFFERENTIAL/PLATELET
BASOS ABS: 0.1 10*3/uL (ref 0.0–0.1)
Basophils Relative: 1.5 % (ref 0.0–3.0)
Eosinophils Absolute: 0.3 10*3/uL (ref 0.0–0.7)
Eosinophils Relative: 4.7 % (ref 0.0–5.0)
HCT: 36.5 % (ref 36.0–46.0)
Hemoglobin: 12 g/dL (ref 12.0–15.0)
LYMPHS ABS: 1.9 10*3/uL (ref 0.7–4.0)
LYMPHS PCT: 29.3 % (ref 12.0–46.0)
MCHC: 32.9 g/dL (ref 30.0–36.0)
MCV: 98.1 fl (ref 78.0–100.0)
MONOS PCT: 10.4 % (ref 3.0–12.0)
Monocytes Absolute: 0.7 10*3/uL (ref 0.1–1.0)
NEUTROS PCT: 54.1 % (ref 43.0–77.0)
Neutro Abs: 3.4 10*3/uL (ref 1.4–7.7)
PLATELETS: 256 10*3/uL (ref 150.0–400.0)
RBC: 3.73 Mil/uL — ABNORMAL LOW (ref 3.87–5.11)
RDW: 13.8 % (ref 11.5–15.5)
WBC: 6.3 10*3/uL (ref 4.0–10.5)

## 2016-12-24 LAB — HEPATIC FUNCTION PANEL
ALK PHOS: 65 U/L (ref 39–117)
ALT: 16 U/L (ref 0–35)
AST: 16 U/L (ref 0–37)
Albumin: 4.3 g/dL (ref 3.5–5.2)
Bilirubin, Direct: 0.1 mg/dL (ref 0.0–0.3)
TOTAL PROTEIN: 6.4 g/dL (ref 6.0–8.3)
Total Bilirubin: 0.5 mg/dL (ref 0.2–1.2)

## 2016-12-24 LAB — BASIC METABOLIC PANEL
BUN: 21 mg/dL (ref 6–23)
CALCIUM: 9.5 mg/dL (ref 8.4–10.5)
CO2: 29 mEq/L (ref 19–32)
Chloride: 98 mEq/L (ref 96–112)
Creatinine, Ser: 0.91 mg/dL (ref 0.40–1.20)
GFR: 61.45 mL/min (ref >60.00)
GLUCOSE: 103 mg/dL — AB (ref 70–99)
Potassium: 4.3 mEq/L (ref 3.5–5.1)
SODIUM: 135 meq/L (ref 135–145)

## 2016-12-24 LAB — LIPID PANEL
CHOL/HDL RATIO: 2
Cholesterol: 162 mg/dL (ref 0–200)
HDL: 80.7 mg/dL (ref >39.00)
LDL Cholesterol: 62 mg/dL (ref 0–99)
NONHDL: 81.79
Triglycerides: 97 mg/dL (ref 0.0–149.0)
VLDL: 19.4 mg/dL (ref 0.0–40.0)

## 2016-12-24 LAB — TSH: TSH: 6.38 u[IU]/mL — AB (ref 0.35–4.50)

## 2016-12-24 LAB — T4, FREE: FREE T4: 0.96 ng/dL (ref 0.60–1.60)

## 2016-12-24 LAB — HEMOGLOBIN A1C: Hgb A1c MFr Bld: 6.6 % — ABNORMAL HIGH (ref 4.6–6.5)
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
## 2016-12-24 NOTE — Assessment & Plan Note (Signed)
walker

## 2016-12-24 NOTE — Assessment & Plan Note (Signed)
Better Losartan bid  HCT 12.5 mg at hs Metoprolol

## 2016-12-24 NOTE — Assessment & Plan Note (Signed)
  On diet  

## 2016-12-24 NOTE — Patient Instructions (Signed)
Tylenol PM may work for leg cramps

## 2017-01-15 ENCOUNTER — Ambulatory Visit (INDEPENDENT_AMBULATORY_CARE_PROVIDER_SITE_OTHER): Payer: MEDICARE | Admitting: *Deleted

## 2017-01-15 ENCOUNTER — Telehealth: Payer: Self-pay | Admitting: Cardiology

## 2017-01-15 DIAGNOSIS — I495 Sick sinus syndrome: Secondary | ICD-10-CM | POA: Diagnosis not present

## 2017-01-15 NOTE — Telephone Encounter (Signed)
Spoke with pt and reminded pt of remote transmission that is due today. Pt verbalized understanding.   

## 2017-01-18 ENCOUNTER — Other Ambulatory Visit: Payer: Self-pay | Admitting: Internal Medicine

## 2017-01-19 NOTE — Progress Notes (Signed)
Remote pacemaker transmission.   

## 2017-01-20 ENCOUNTER — Other Ambulatory Visit: Payer: Self-pay | Admitting: Internal Medicine

## 2017-01-20 DIAGNOSIS — I10 Essential (primary) hypertension: Secondary | ICD-10-CM

## 2017-01-20 LAB — CUP PACEART REMOTE DEVICE CHECK
Battery Remaining Longevity: 85 mo
Brady Statistic AP VS Percent: 96 %
Brady Statistic AS VP Percent: 0 %
Implantable Lead Location: 753860
Implantable Lead Model: 4470
Implantable Lead Serial Number: 548557
Lead Channel Impedance Value: 544 Ohm
Lead Channel Pacing Threshold Amplitude: 0.625 V
Lead Channel Pacing Threshold Pulse Width: 0.4 ms
Lead Channel Pacing Threshold Pulse Width: 0.4 ms
Lead Channel Setting Pacing Pulse Width: 0.4 ms
Lead Channel Setting Sensing Sensitivity: 4 mV
MDC IDC LEAD IMPLANT DT: 20111031
MDC IDC LEAD IMPLANT DT: 20111031
MDC IDC LEAD LOCATION: 753859
MDC IDC LEAD SERIAL: 686076
MDC IDC MSMT BATTERY IMPEDANCE: 544 Ohm
MDC IDC MSMT BATTERY VOLTAGE: 2.78 V
MDC IDC MSMT LEADCHNL RA IMPEDANCE VALUE: 479 Ohm
MDC IDC MSMT LEADCHNL RA PACING THRESHOLD AMPLITUDE: 0.5 V
MDC IDC MSMT LEADCHNL RV SENSING INTR AMPL: 11.2 mV
MDC IDC PG IMPLANT DT: 20111031
MDC IDC SESS DTM: 20181005003653
MDC IDC SET LEADCHNL RA PACING AMPLITUDE: 2 V
MDC IDC SET LEADCHNL RV PACING AMPLITUDE: 2.5 V
MDC IDC STAT BRADY AP VP PERCENT: 1 %
MDC IDC STAT BRADY AS VS PERCENT: 3 %

## 2017-01-21 ENCOUNTER — Telehealth: Payer: Self-pay

## 2017-01-21 DIAGNOSIS — I10 Essential (primary) hypertension: Secondary | ICD-10-CM

## 2017-01-21 MED ORDER — LOSARTAN POTASSIUM 25 MG PO TABS: 25.0000 mg | ORAL_TABLET | Freq: Two times a day (BID) | ORAL | 0 refills | Status: DC

## 2017-01-21 NOTE — Telephone Encounter (Signed)
Pt request rx to local. Mail order will not get to her before she runs out.  erx sent.

## 2017-01-22 ENCOUNTER — Encounter: Payer: Self-pay | Admitting: Cardiology

## 2017-02-09 ENCOUNTER — Encounter: Payer: Self-pay | Admitting: *Deleted

## 2017-02-10 ENCOUNTER — Other Ambulatory Visit: Payer: Self-pay

## 2017-02-10 DIAGNOSIS — I10 Essential (primary) hypertension: Secondary | ICD-10-CM

## 2017-02-11 MED ORDER — HYDROCHLOROTHIAZIDE 12.5 MG PO TABS: 12.5000 mg | ORAL_TABLET | Freq: Every day | ORAL | 11 refills | Status: DC

## 2017-02-23 ENCOUNTER — Encounter: Payer: Self-pay | Admitting: Internal Medicine

## 2017-02-23 ENCOUNTER — Ambulatory Visit (INDEPENDENT_AMBULATORY_CARE_PROVIDER_SITE_OTHER): Payer: MEDICARE | Admitting: Internal Medicine

## 2017-02-23 ENCOUNTER — Encounter (INDEPENDENT_AMBULATORY_CARE_PROVIDER_SITE_OTHER): Payer: Self-pay

## 2017-02-23 VITALS — HR 81 | Ht 65.0 in | Wt 156.0 lb

## 2017-02-23 DIAGNOSIS — I471 Supraventricular tachycardia: Secondary | ICD-10-CM | POA: Diagnosis not present

## 2017-02-23 DIAGNOSIS — I10 Essential (primary) hypertension: Secondary | ICD-10-CM

## 2017-02-23 DIAGNOSIS — I495 Sick sinus syndrome: Secondary | ICD-10-CM | POA: Diagnosis not present

## 2017-02-23 DIAGNOSIS — I6522 Occlusion and stenosis of left carotid artery: Secondary | ICD-10-CM

## 2017-02-23 NOTE — Patient Instructions (Addendum)
Medication Instructions:  Your physician has recommended you make the following change in your medication:  1.) STOP Hydrochlorothiazide   -- If you need a refill on your cardiac medications before your next appointment, please call your pharmacy. --  Labwork: None ordered  Testing/Procedures: None ordered  Follow-Up: Your physician wants you to follow-up in: 1 year with Chanetta Marshall NP.  You will receive a reminder letter in the mail two months in advance. If you don't receive a letter, please call our office to schedule the follow-up appointment.  Remote monitoring is used to monitor your Pacemaker  from home. This monitoring reduces the number of office visits required to check your device to one time per year. It allows [place] to keep an eye on the functioning of your device to ensure it is working properly. You are scheduled for a device check from home on 04/16/2017. You may send your transmission at any time that day. If you have a wireless device, the transmission will be sent automatically. After your physician reviews your transmission, you will receive a postcard with your next transmission date.   Thank you for choosing [facility]!!   Frederik Schmidt, RN [phone]  Any Other Special Instructions Will Be Listed Below (If Applicable).

## 2017-02-23 NOTE — Progress Notes (Signed)
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 female who presents today for routine electrophysiology followup.  Since last being seen in our clinic, the patient reports doing very well.  She has noticed some leg cramps with hctz. Today, she denies symptoms of palpitations, chest pain, shortness of breath,  lower extremity edema, dizziness, presyncope, or syncope.  The patient is otherwise without complaint today.   Past Medical History:  Diagnosis Date  . Anemia, unspecified   . Anxiety state, unspecified   . Aortic valve disorders   . Bradycardia    s/p PPM  . Bursitis   . Complication of anesthesia    has had difficulty awakening in past; pt states did well with the anesthesia given during her carotid surgery   . Diabetes mellitus without complication ([place])   . Diverticulosis of colon (without mention of hemorrhage)   . Herpes zoster with other nervous system complications(053.19)   . Hyperlipidemia   . Hypertension   . Hypothyroidism   . Irritable bowel syndrome   . Menopause   . Osteoarthrosis, unspecified whether generalized or localized, unspecified site   . Peripheral vascular disease ([place])   . TIA (transient ischemic attack)   . Unspecified hearing loss   . Varicose veins    Past Surgical History:  Procedure Laterality Date  . APPENDECTOMY    . CATARACT EXTRACTION, BILATERAL  end of June 2013  . CHOLECYSTECTOMY    . DILATION AND CURETTAGE OF UTERUS    . PACEMAKER INSERTION     MDT dual chamber PPM implanted by Dr [name] for sinus node dysfunction   . TONSILLECTOMY    . VAGINAL HYSTERECTOMY      ROS- all systems are reviewed and negative except as per HPI above  Current Outpatient Medications  Medication Sig Dispense Refill  . ALPHA LIPOIC ACID PO Take 1 tablet by mouth daily.    [name] aspirin 325 MG tablet Take 325 mg by mouth daily.    [name] CRANBERRY PO Take 1 tablet by mouth daily with supper.    . Cyanocobalamin  (VITAMIN B-12 PO) Take 1 tablet by mouth at bedtime.    [name] Calcium (STOOL SOFTENER PO) Take 2 capsules by mouth at bedtime.    [name] EVENING PRIMROSE OIL PO Take 1 capsule by mouth daily.    . fexofenadine (ALLEGRA) 30 MG tablet Take 30 mg by mouth 2 (two) times daily.    [name] glucose blood (ONE TOUCH ULTRA TEST) test strip TEST ONCE DAILY AS DIRECTED Dx E11.9 100 each 5  . hydrochlorothiazide (HYDRODIURIL) 12.5 MG tablet Take 1 tablet (12.5 mg total) by mouth at bedtime. 30 tablet 11  . ipratropium (ATROVENT) 0.06 % nasal spray Place 2 sprays into both nostrils 3 (three) times daily. 45 mL 1  . Lancets (ONETOUCH ULTRASOFT) lancets Use to check blood sugars once a day Dx E11.9 100 each 3  . levothyroxine (SYNTHROID, LEVOTHROID) 100 MCG tablet Take 1 tablet (100 mcg total) by mouth daily before breakfast. 90 tablet 1  . losartan (COZAAR) 25 MG tablet Take 1 tablet (25 mg total) by mouth 2 (two) times daily. 60 tablet 0  . LYRICA 75 MG capsule Take 1 capsule (75 mg total) by mouth 2 (two) times daily. 180 capsule 1  . Magnesium 250 MG TABS Take 1 tablet daily by mouth.     . Multiple Vitamins-Minerals (EYE VITAMINS) CAPS Take 1 capsule by mouth  daily.    . Polyethyl [name] (SYSTANE OP) Apply to eye daily.    . simvastatin (ZOCOR) 40 MG tablet Take 1 tablet (40 mg total) by mouth daily at 6 PM. 90 tablet 3  . metoprolol tartrate (LOPRESSOR) 25 MG tablet Take 1 tablet (25 mg total) by mouth 2 (two) times daily. 60 tablet 6   No current facility-administered medications for this visit.     Physical Exam: Vitals:   02/23/17 1358  Pulse: 81  SpO2: 97%  Weight: 156 lb (70.8 kg)  Height: 5\' 5"  (1.651 m)     GEN- The patient is elderly and frail appearing, alert and oriented x 3 today.   Head- normocephalic, atraumatic Eyes-  Sclera clear, conjunctiva pink Ears- hearing intact Oropharynx- clear Lungs- Clear to ausculation bilaterally, normal work of breathing Chest- pacemaker  pocket is well healed Heart- Regular rate and rhythm, no murmurs, rubs or gallops, PMI not laterally displaced GI- soft, NT, ND, + BS Extremities- no clubbing, cyanosis, or edema  Pacemaker interrogation- reviewed in detail today,  See PACEART report   Assessment and Plan:  1. Symptomatic sinus bradycardia  Normal pacemaker function See Pace Art report No changes today  2. 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 HTN Controlled Stop hctz per patient preference  4. TIA On ASA 325mg  daily I have advised that she follow-up with Dr Erlinda Hong for long term management and dosing of ASA.  carelink  Return to see EP NP every year I will see when needed  Thompson Grayer MD, [facility] 02/23/2017 2:05 PM

## 2017-02-25 ENCOUNTER — Telehealth: Payer: Self-pay | Admitting: Internal Medicine

## 2017-02-25 MED ORDER — BENZONATATE 100 MG PO CAPS: 100.0000 mg | ORAL_CAPSULE | Freq: Two times a day (BID) | ORAL | 0 refills | Status: DC | PRN

## 2017-02-25 NOTE — Telephone Encounter (Signed)
Pt called stating that she has a cold and is not feeling well. She said that she had this same issue last year and was given Benzonatate. She asked if this could be sent in for her. I told her that she would need an appointment but she said that she is not able to drive and does not have anyone that could bring her here. Please advise.

## 2017-02-25 NOTE — Telephone Encounter (Signed)
OK - Rx emailed Thx 

## 2017-02-26 NOTE — Telephone Encounter (Signed)
Called pt no answer LMOM MD [facility] 

## 2017-03-04 ENCOUNTER — Emergency Department (HOSPITAL_COMMUNITY): Payer: MEDICARE

## 2017-03-04 ENCOUNTER — Other Ambulatory Visit: Payer: Self-pay

## 2017-03-04 ENCOUNTER — Emergency Department (HOSPITAL_COMMUNITY)
Admission: EM | Admit: 2017-03-04 | Discharge: 2017-03-04 | Disposition: A | Discharge status: Home / Self Care | Payer: MEDICARE | Attending: Emergency Medicine | Admitting: Emergency Medicine

## 2017-03-04 DIAGNOSIS — E119 Type 2 diabetes mellitus without complications: Secondary | ICD-10-CM | POA: Diagnosis not present

## 2017-03-04 DIAGNOSIS — R140 Abdominal distension (gaseous): Secondary | ICD-10-CM | POA: Diagnosis not present

## 2017-03-04 DIAGNOSIS — K5904 Chronic idiopathic constipation: Secondary | ICD-10-CM | POA: Diagnosis not present

## 2017-03-04 DIAGNOSIS — I359 Nonrheumatic aortic valve disorder, unspecified: Secondary | ICD-10-CM | POA: Insufficient documentation

## 2017-03-04 DIAGNOSIS — I10 Essential (primary) hypertension: Secondary | ICD-10-CM | POA: Diagnosis not present

## 2017-03-04 DIAGNOSIS — K5900 Constipation, unspecified: Secondary | ICD-10-CM | POA: Diagnosis present

## 2017-03-04 DIAGNOSIS — Z79899 Other long term (current) drug therapy: Secondary | ICD-10-CM | POA: Diagnosis not present

## 2017-03-04 DIAGNOSIS — E039 Hypothyroidism, unspecified: Secondary | ICD-10-CM | POA: Diagnosis not present

## 2017-03-04 DIAGNOSIS — Z7982 Long term (current) use of aspirin: Secondary | ICD-10-CM | POA: Diagnosis not present

## 2017-03-04 LAB — URINALYSIS, ROUTINE W REFLEX MICROSCOPIC
Bilirubin Urine: NEGATIVE
GLUCOSE, UA: NEGATIVE mg/dL
KETONES UR: NEGATIVE mg/dL
NITRITE: NEGATIVE
PH: 7 (ref 5.0–8.0)
PROTEIN: NEGATIVE mg/dL
Specific Gravity, Urine: 1.009 (ref 1.005–1.030)

## 2017-03-04 LAB — COMPREHENSIVE METABOLIC PANEL
ALK PHOS: 95 U/L (ref 38–126)
ALT: 18 U/L (ref 14–54)
ANION GAP: 9 (ref 5–15)
AST: 27 U/L (ref 15–41)
Albumin: 4.1 g/dL (ref 3.5–5.0)
BILIRUBIN TOTAL: 0.8 mg/dL (ref 0.3–1.2)
BUN: 13 mg/dL (ref 6–20)
CALCIUM: 9.2 mg/dL (ref 8.9–10.3)
CO2: 24 mmol/L (ref 22–32)
Chloride: 93 mmol/L — ABNORMAL LOW (ref 101–111)
Creatinine, Ser: 0.8 mg/dL (ref 0.44–1.00)
GFR calc Af Amer: >60 mL/min (ref >60)
Glucose, Bld: 94 mg/dL (ref 65–99)
POTASSIUM: 4.1 mmol/L (ref 3.5–5.1)
Sodium: 126 mmol/L — ABNORMAL LOW (ref 135–145)
Total Protein: 7 g/dL (ref 6.5–8.1)

## 2017-03-04 LAB — CBC WITH DIFFERENTIAL/PLATELET
Basophils Absolute: 0.1 10*3/uL (ref 0.0–0.1)
Basophils Relative: 1 %
Eosinophils Absolute: 0.2 10*3/uL (ref 0.0–0.7)
Eosinophils Relative: 3 %
HEMATOCRIT: 38.8 % (ref 36.0–46.0)
HEMOGLOBIN: 13.1 g/dL (ref 12.0–15.0)
LYMPHS ABS: 1.7 10*3/uL (ref 0.7–4.0)
LYMPHS PCT: 23 %
MCH: 32.2 pg (ref 26.0–34.0)
MCHC: 33.8 g/dL (ref 30.0–36.0)
MCV: 95.3 fL (ref 78.0–100.0)
MONO ABS: 0.9 10*3/uL (ref 0.1–1.0)
MONOS PCT: 12 %
NEUTROS ABS: 4.4 10*3/uL (ref 1.7–7.7)
Neutrophils Relative %: 61 %
Platelets: 234 10*3/uL (ref 150–400)
RBC: 4.07 MIL/uL (ref 3.87–5.11)
RDW: 12.9 % (ref 11.5–15.5)
WBC: 7.2 10*3/uL (ref 4.0–10.5)

## 2017-03-04 LAB — I-STAT CG4 LACTIC ACID, ED: LACTIC ACID, VENOUS: 1.19 mmol/L (ref 0.5–1.9)

## 2017-03-04 LAB — LIPASE, BLOOD: Lipase: 56 U/L — ABNORMAL HIGH (ref 11–51)

## 2017-03-04 MED ORDER — CIPROFLOXACIN HCL 500 MG PO TABS: 500.0000 mg | ORAL_TABLET | Freq: Two times a day (BID) | ORAL | 0 refills | Status: AC

## 2017-03-04 MED ORDER — POLYETHYLENE GLYCOL 3350 17 G PO PACK: PACK | ORAL | 0 refills | Status: DC

## 2017-03-04 MED ORDER — SODIUM CHLORIDE 0.9 % IV BOLUS (SEPSIS)
500.0000 mL | Freq: Once | INTRAVENOUS | Status: AC
  Administered 2017-03-04: 500 mL via INTRAVENOUS

## 2017-03-04 MED ORDER — MILK AND MOLASSES ENEMA
1.0000 | Freq: Once | RECTAL | Status: AC
  Administered 2017-03-04: 250 mL via RECTAL
  Filled 2017-03-04: qty 250

## 2017-03-04 MED ORDER — IOPAMIDOL (ISOVUE-300) INJECTION 61%
INTRAVENOUS | Status: AC
  Administered 2017-03-04: 100 mL
  Filled 2017-03-04: qty 100

## 2017-03-04 MED ORDER — IOPAMIDOL (ISOVUE-300) INJECTION 61%
INTRAVENOUS | Status: AC
  Filled 2017-03-04: qty 100

## 2017-03-04 MED ORDER — ONDANSETRON HCL 4 MG/2ML IJ SOLN
4.0000 mg | Freq: Once | INTRAMUSCULAR | Status: DC
  Filled 2017-03-04: qty 2

## 2017-03-04 MED ORDER — POLYETHYLENE GLYCOL 3350 17 G PO PACK
17.0000 g | PACK | Freq: Every day | ORAL | Status: DC
  Administered 2017-03-04: 17 g via ORAL
  Filled 2017-03-04: qty 1

## 2017-03-04 NOTE — ED Triage Notes (Signed)
Pt states she has lower back pain and abdominal pain that is so painful she is unable to stand up without excruciating pain. She is alert and oriented. Reports she has been unable to have a bowel movement for several days. She reports using laxative without improvement.

## 2017-03-04 NOTE — ED Provider Notes (Addendum)
[facility] EMERGENCY DEPARTMENT Provider Note   CSN: [id number] Arrival date & time: 03/04/17  1914     History   Chief Complaint Chief Complaint  Patient presents with  . Constipation    HPI Kayla Davis is a 83 y.o. female.  HPI   83 year old female with past medical history as below here with diffuse abdominal pain.  The patient states that over the last 3-4 days, she is a progressive worsening abdominal distention and pain.  She notes that she has not had a bowel movement this time.  Despite multiple enemas as well as p.o. doses of MiraLAX.  She has had associated aching, throbbing lower back pain that is worse with any kind of movement or palpation.  She has had nausea and decreased appetite but no vomiting.  Denies any fevers.  Denies any urinary symptoms, though she does have some mild back pain when she urinates.  Denies any flank pain.  She has a history of constipation and impaction, but states she was unable to disimpact herself, which she has been able to do in the past.  She feels like it is "higher up."  No recent sick contacts.  No recent trauma.   Past Medical History:  Diagnosis Date  . Anemia, unspecified   . Anxiety state, unspecified   . Aortic valve disorders   . Bradycardia    s/p PPM  . Bursitis   . Complication of anesthesia    has had difficulty awakening in past; pt states did well with the anesthesia given during her carotid surgery   . Diabetes mellitus without complication ([place])   . Diverticulosis of colon (without mention of hemorrhage)   . Herpes zoster with other nervous system complications(053.19)   . Hyperlipidemia   . Hypertension   . Hypothyroidism   . Irritable bowel syndrome   . Menopause   . Osteoarthrosis, unspecified whether generalized or localized, unspecified site   . Peripheral vascular disease ([place])   . TIA (transient ischemic attack)   . Unspecified hearing loss   . Varicose veins     Patient  Active Problem List   Diagnosis Date Noted  . Amaurosis 07/03/2016  . Diet-controlled type 2 diabetes mellitus ([place]) 02/29/2016  . Vertigo 12/11/2015  . Gait disorder 06/27/2015  . OA (osteoarthritis) of hip 03/23/2015  . Preop exam for internal medicine 03/04/2015  . Shortness of breath 02/13/2015  . Rhinitis, atrophic 11/22/2014  . Well adult exam 04/21/2014  . Amaurosis fugax 03/31/2014  . History of CEA (carotid endarterectomy) 03/31/2014  . HLD (hyperlipidemia) 03/31/2014  . Carotid stenosis 02/07/2014  . Cerebral infarction ([place]) 01/16/2014  . Dyslipidemia 01/16/2014  . Weakness generalized 04/30/2013  . CAP (community acquired pneumonia) 04/29/2013  . Edema 03/22/2013  . Urinary incontinence in female 03/22/2013  . Constipation 10/01/2012  . UTI (urinary tract infection) 10/01/2012  . Diarrhea 08/26/2012  . Cerumen impaction 12/11/2011  . Pacemaker-Medtronic 07/30/2010  . SINUS BRADYCARDIA 05/17/2010  . DYSURIA 01/29/2010  . Aortic valve disorders 07/31/2009  . Venous (peripheral) insufficiency 07/31/2009  . Hyponatremia 07/01/2009  . LEG CRAMPS 06/12/2009  . LEG PAIN, BILATERAL 02/01/2009  . Backache 08/24/2008  . Anemia 06/21/2008  . DEGENERATIVE JOINT DISEASE 02/19/2008  . CHEST WALL PAIN, HX OF 02/19/2008  . IRRITABLE BOWEL SYNDROME 12/28/2007  . DIABETES MELLITUS, BORDERLINE 11/06/2007  . GASTRITIS 07/08/2007  . POSTHERPETIC NEURALGIA 07/01/2007  . Herpes zoster without mention of complication 78/29/5621  . Hypothyroidism 06/08/2007  .  HYPERLIPIDEMIA 06/08/2007  . Anxiety state 06/08/2007  . Hereditary and idiopathic peripheral neuropathy 06/08/2007  . HEARING LOSS 06/08/2007  . Essential hypertension 06/08/2007  . Transient cerebral ischemia 06/08/2007  . BRONCHITIS, RECURRENT 06/08/2007  . DIVERTICULOSIS OF COLON 06/08/2007  . Osteoporosis 06/08/2007    Past Surgical History:  Procedure Laterality Date  . APPENDECTOMY    . CATARACT EXTRACTION,  BILATERAL  end of June 2013  . CHOLECYSTECTOMY    . DILATION AND CURETTAGE OF UTERUS    . ENDARTERECTOMY Right 01/20/2014   Procedure: RIGHT CAROTID ARTERY ENDARTERECTOMY WITH DACRON PATCH ANGIOPLASTY;  Surgeon: Mal Misty, MD;  Location: [facility];  Service: Vascular;  Laterality: Right;  . PACEMAKER INSERTION     MDT dual chamber PPM implanted by Dr [name] for sinus node dysfunction   . TONSILLECTOMY    . TOTAL HIP ARTHROPLASTY Left 03/23/2015   Procedure: TOTAL HIP ARTHROPLASTY ANTERIOR APPROACH;  Surgeon: Gaynelle Arabian, MD;  Location: [facility];  Service: Orthopedics;  Laterality: Left;  [name] VAGINAL HYSTERECTOMY      OB History    No data available       Home Medications    Prior to Admission medications   Medication Sig Start Date End Date Taking? Authorizing Provider  ALPHA LIPOIC ACID PO Take 1 tablet by mouth daily.    [provider]  aspirin 325 MG tablet Take 325 mg by mouth daily.    [provider]  benzonatate (TESSALON) 100 MG capsule Take 1-2 capsules (100-200 mg total) 2 (two) times daily as needed by mouth for cough. 02/25/17   Plotnikov, Evie Lacks, MD  ciprofloxacin (CIPRO) 500 MG tablet Take 1 tablet (500 mg total) by mouth 2 (two) times daily for 7 days. 03/04/17 03/11/17  Duffy Bruce, MD  CRANBERRY PO Take 1 tablet by mouth daily with supper.    [provider]  Cyanocobalamin (VITAMIN B-12 PO) Take 1 tablet by mouth at bedtime.    [provider]  Docusate Calcium (STOOL SOFTENER PO) Take 2 capsules by mouth at bedtime.    [provider]  EVENING PRIMROSE OIL PO Take 1 capsule by mouth daily.    [provider]  fexofenadine (ALLEGRA) 30 MG tablet Take 30 mg by mouth 2 (two) times daily.    [provider]  glucose blood (ONE TOUCH ULTRA TEST) test strip TEST ONCE DAILY AS DIRECTED Dx E11.9 10/09/16   Plotnikov, Evie Lacks, MD  ipratropium (ATROVENT) 0.06 % nasal spray Place 2 sprays into both nostrils  3 (three) times daily. 02/20/16   Plotnikov, Evie Lacks, MD  Lancets [facility] ULTRASOFT) lancets Use to check blood sugars once a day Dx E11.9 09/17/16   Plotnikov, Evie Lacks, MD  levothyroxine (SYNTHROID, LEVOTHROID) 100 MCG tablet Take 1 tablet (100 mcg total) by mouth daily before breakfast. 10/06/16   Plotnikov, Evie Lacks, MD  losartan (COZAAR) 25 MG tablet Take 1 tablet (25 mg total) by mouth 2 (two) times daily. 01/21/17   Plotnikov, Evie Lacks, MD  LYRICA 75 MG capsule Take 1 capsule (75 mg total) by mouth 2 (two) times daily. 09/17/16   Plotnikov, Evie Lacks, MD  Magnesium 250 MG TABS Take 1 tablet daily by mouth.     [provider]  metoprolol tartrate (LOPRESSOR) 25 MG tablet Take 1 tablet (25 mg total) by mouth 2 (two) times daily. 06/18/16 09/16/16  Patsey Berthold, NP  Multiple Vitamins-Minerals (EYE VITAMINS) CAPS Take 1 capsule by mouth daily.  [provider]  Polyethyl [name] (SYSTANE OP) Apply to eye daily.    [provider]  polyethylene glycol (MIRALAX) packet Mix 6 packets in one 32 oz bottle of gatorade, powerade, or other juice. Drink this bottle over the course of a day. If you have not had multiple bowel movements, you can start taking this three times a day, then decrease to once a day once you have liquid bowel movements. 03/04/17   Duffy Bruce, MD  simvastatin (ZOCOR) 40 MG tablet Take 1 tablet (40 mg total) by mouth daily at 6 PM. 01/20/17   Plotnikov, Evie Lacks, MD    Family History Family History  Problem Relation Age of Onset  . Heart disease Mother   . Heart disease Father   . Diabetes Father   . Cancer Paternal Grandmother     Social History Social History   Tobacco Use  . Smoking status: Never Smoker  . Smokeless tobacco: Never Used  Substance Use Topics  . Alcohol use: No    Alcohol/week: 0.0 oz  . Drug use: No     Allergies   Penicillins; Alprazolam; Bactrim [sulfamethoxazole-trimethoprim]; Ceclor  [cefaclor]; Cephalexin; Ciprofloxacin; Clindamycin/lincomycin; Hydrocodone; Noroxin [norfloxacin]; Sulfonamide derivatives; Tetracyclines & related; and Tramadol   Review of Systems Review of Systems  Gastrointestinal: Positive for abdominal distention, abdominal pain and constipation.  Musculoskeletal: Positive for back pain.  Neurological: Positive for weakness.  All other systems reviewed and are negative.    Physical Exam Updated Vital Signs BP 129/69   Pulse 63   Temp 97.7 F (36.5 C) (Oral)   Resp 16   LMP  (LMP Unknown)   SpO2 100%   Physical Exam  Constitutional: She is oriented to person, place, and time. She appears well-developed and well-nourished. No distress.  HENT:  Head: Normocephalic and atraumatic.  Eyes: Conjunctivae are normal.  Neck: Neck supple.  Cardiovascular: Normal rate, regular rhythm and normal heart sounds. Exam reveals no friction rub.  No murmur heard. Pulmonary/Chest: Effort normal and breath sounds normal. No respiratory distress. She has no wheezes. She has no rales.  Abdominal: Soft. Normal appearance. She exhibits distension. There is generalized tenderness. There is no guarding.  Musculoskeletal: She exhibits no edema.  Neurological: She is alert and oriented to person, place, and time. She exhibits normal muscle tone.  Skin: Skin is warm. Capillary refill takes less than 2 seconds.  No CVA tenderness  Psychiatric: She has a normal mood and affect.  Nursing note and vitals reviewed.    ED Treatments / Results  Labs (all labs ordered are listed, but only abnormal results are displayed) Labs Reviewed  COMPREHENSIVE METABOLIC PANEL - Abnormal; Notable for the following components:      Result Value   Sodium 126 (*)    Chloride 93 (*)    All other components within normal limits  LIPASE, BLOOD - Abnormal; Notable for the following components:   Lipase 56 (*)    All other components within normal limits  URINALYSIS, ROUTINE W REFLEX  MICROSCOPIC - Abnormal; Notable for the following components:   APPearance CLOUDY (*)    Hgb urine dipstick SMALL (*)    Leukocytes, UA LARGE (*)    Bacteria, UA RARE (*)    Squamous Epithelial / LPF 0-5 (*)    All other components within normal limits  URINE CULTURE  CBC WITH DIFFERENTIAL/PLATELET  I-STAT CG4 LACTIC ACID, ED  I-STAT CG4 LACTIC ACID, ED    EKG  EKG Interpretation None  Radiology Ct Abdomen Pelvis W Contrast  Result Date: 03/04/2017 CLINICAL DATA:  Abdominal distention and pain. No recent bowel movement for the past several days. EXAM: CT ABDOMEN AND PELVIS WITH CONTRAST TECHNIQUE: Multidetector CT imaging of the abdomen and pelvis was performed using the standard protocol following bolus administration of intravenous contrast. CONTRAST:  142mL ISOVUE-300 IOPAMIDOL (ISOVUE-300) INJECTION 61% COMPARISON:  CT abdomen pelvis dated October 10, 2005. FINDINGS: Lower chest: No acute abnormality. Bibasilar atelectasis. Cardiomegaly. Coronary calcifications. Extensive mitral valve annular calcifications. Hepatobiliary: No focal liver abnormality is seen. Status post cholecystectomy. No biliary dilatation. Pancreas: Unremarkable. No pancreatic ductal dilatation or surrounding inflammatory changes. Spleen: Normal in size without focal abnormality. Adrenals/Urinary Tract: The adrenal glands are unremarkable. The kidneys are unremarkable. No hydronephrosis. The bladder is partially decompressed. Stomach/Bowel: Stomach is within normal limits. Appendix is not identified, and reportedly surgically absent. No evidence of bowel wall thickening, distention, or inflammatory changes. Scattered left-sided colonic diverticulosis. Large stool burden throughout the colon with stasis in the terminal ileum. Vascular/Lymphatic: Aortic atherosclerosis. No enlarged abdominal or pelvic lymph nodes. Reproductive: Status post hysterectomy. No adnexal masses. Other: No free fluid or pneumoperitoneum. Small  fat containing umbilical hernia. Musculoskeletal: Prior left total hip arthroplasty. Severe degenerative changes of the thoracolumbar spine. Unchanged 9 mm anterolisthesis of L5 on S1 due to bilateral pars defects. Chronic appearing mild superior endplate depressions of the T12 and L3 vertebral bodies. No acute fracture. IMPRESSION: 1.  No acute intra-abdominal process. 2. Large colonic stool burden with stasis in the terminal ileum, consistent with constipation. 3.  Aortic atherosclerosis (ICD10-I70.0). Electronically Signed   By: Titus Dubin M.D.   On: 03/04/2017 12:06    Procedures Procedures (including critical care time)  Medications Ordered in ED Medications  ondansetron (ZOFRAN) injection 4 mg (0 mg Intravenous Hold 03/04/17 1027)  iopamidol (ISOVUE-300) 61 % injection (not administered)  polyethylene glycol (MIRALAX / GLYCOLAX) packet 17 g (17 g Oral Given 03/04/17 1349)  sodium chloride 0.9 % bolus 500 mL (0 mLs Intravenous Stopped 03/04/17 1219)  iopamidol (ISOVUE-300) 61 % injection (100 mLs  Contrast Given 03/04/17 1143)  milk and molasses enema (250 mLs Rectal Given 03/04/17 1349)     Initial Impression / Assessment and Plan / ED Course  I have reviewed the triage vital signs and the nursing notes.  Pertinent labs & imaging results that were available during my care of the patient were reviewed by me and considered in my medical decision making (see chart for details).     83 yo F with PMHx as above here with abdominal pain and constipation. Lab work is very reassuring with normal WBC, normal LA, and normal renal function/LFTs. No signs of sepsis. CT imaging obtained and is c/w severe constipation, o/w no acute process. No signs of impaction on exam or on CT scan. UA does show pyuria but this is chronic per records, and pt declines any urinary sx at this time. Pt subsequently treated with miralax, enema x 1 with large BM in ED. She feels markedly improved. Will treat with  bowel regimen a thome, encouraged PO hydration, and PRN script for Cipro in event she develops urinary sx. UCx has been sent. Pt in agreement with this plan, and has used PRN scripts for her UTIs in past, understands indications/contraindications.  Final Clinical Impressions(s) / ED Diagnoses   Final diagnoses:  Chronic idiopathic constipation    ED Discharge Orders        Ordered    polyethylene glycol (  MIRALAX) packet     03/04/17 1511    ciprofloxacin (CIPRO) 500 MG tablet  2 times daily     03/04/17 1511       Duffy Bruce, MD 03/04/17 1710    Duffy Bruce, MD 03/04/17 2230

## 2017-03-04 NOTE — ED Notes (Signed)
Waiting for materials to send up enema tubing before administering enema.

## 2017-03-04 NOTE — ED Notes (Signed)
On way to CT 

## 2017-03-04 NOTE — Discharge Instructions (Signed)
-   MIX 6 PACKETS/CAP FULLS OF MIRALAX IN A 32 OZ BOTTLE OF GATORADE/POWERADE/OTHER LIQUID. DRINK THIS SLOWLY OVER THE COURSE OF A DAY.  - ONCE YOU START TO HAVE MULTIPLE LOOSE BOWEL MOVEMENTS, DECREASE THIS TO ONE PACKET OF MIRALAX PER DAY  - ONCE YOUR CONSTIPATION IS IMPROVED, CONTINUE TAKING THE DULCOLAX/COLACE DAILY, AND I RECOMMEND THAT YOU ADD ONE CAP FULL OF MIRALAX A DAY TO THIS REGIMEN, TO HELP KEEP THINGS MOVING

## 2017-03-06 LAB — URINE CULTURE: Culture: >=100000 — AB

## 2017-03-07 ENCOUNTER — Telehealth: Payer: Self-pay

## 2017-03-07 NOTE — Telephone Encounter (Signed)
Post ED Visit - Positive Culture Follow-up  Culture report reviewed by antimicrobial stewardship pharmacist:  []  Elenor Quinones, Pharm.D. []  Heide Guile, Pharm.D., BCPS AQ-ID []  Parks Neptune, Pharm.D., BCPS []  Alycia Rossetti, Pharm.D., BCPS []  [place], Pharm.D., BCPS, AAHIVP []  [name], Pharm.D., BCPS, AAHIVP []  Salome Arnt, PharmD, BCPS []  Dimitri Ped, PharmD, BCPS []  Vincenza Hews, PharmD, BCPS Jimmy Footman Pharm D Positive urine culture Treated with Ciprofloxacin, organism sensitive to the same and no further patient follow-up is required at this time.  Genia Del 03/07/2017, 10:39 AM

## 2017-03-09 ENCOUNTER — Telehealth: Payer: Self-pay | Admitting: Internal Medicine

## 2017-03-09 MED ORDER — METOPROLOL TARTRATE 25 MG PO TABS: 25.0000 mg | ORAL_TABLET | Freq: Two times a day (BID) | ORAL | 1 refills | Status: DC

## 2017-03-09 NOTE — Telephone Encounter (Signed)
Pt called requesting a refill on metoprolol tartrate (LOPRESSOR) 25 MG tablet #90 sent to [facility].

## 2017-03-09 NOTE — Telephone Encounter (Signed)
Reviewed chart pt is up-to-date sent refills to [facility].../lm,b

## 2017-03-13 ENCOUNTER — Ambulatory Visit (INDEPENDENT_AMBULATORY_CARE_PROVIDER_SITE_OTHER): Payer: MEDICARE | Admitting: Internal Medicine

## 2017-03-13 ENCOUNTER — Encounter: Payer: Self-pay | Admitting: Internal Medicine

## 2017-03-13 VITALS — BP 126/74 | HR 60 | Temp 97.8°F | Ht 65.0 in | Wt 158.0 lb

## 2017-03-13 DIAGNOSIS — R109 Unspecified abdominal pain: Secondary | ICD-10-CM | POA: Insufficient documentation

## 2017-03-13 DIAGNOSIS — R1084 Generalized abdominal pain: Secondary | ICD-10-CM | POA: Diagnosis not present

## 2017-03-13 DIAGNOSIS — R7309 Other abnormal glucose: Secondary | ICD-10-CM | POA: Diagnosis not present

## 2017-03-13 DIAGNOSIS — I6522 Occlusion and stenosis of left carotid artery: Secondary | ICD-10-CM | POA: Diagnosis not present

## 2017-03-13 DIAGNOSIS — I10 Essential (primary) hypertension: Secondary | ICD-10-CM

## 2017-03-13 DIAGNOSIS — K5900 Constipation, unspecified: Secondary | ICD-10-CM | POA: Diagnosis not present

## 2017-03-13 MED ORDER — LOSARTAN POTASSIUM 25 MG PO TABS: 25.0000 mg | ORAL_TABLET | Freq: Two times a day (BID) | ORAL | 3 refills | Status: DC

## 2017-03-13 MED ORDER — HYOSCYAMINE SULFATE 0.125 MG PO TABS: 0.1250 mg | ORAL_TABLET | ORAL | 1 refills | Status: DC | PRN

## 2017-03-13 NOTE — Addendum Note (Signed)
Addended by: [name] on: 03/13/2017 02:51 PM   Modules accepted: Orders

## 2017-03-13 NOTE — Assessment & Plan Note (Signed)
  On diet  

## 2017-03-13 NOTE — Progress Notes (Signed)
Subjective:  Patient ID: Kayla Davis, female    DOB: 27-May-1922  Age: 83 y.o. MRN: [id number]  CC: No chief complaint on file.   HPI Kayla Davis presents for a post-ER visit f/u on 11/21: abd pain was due to constipation, Sx's resolved after enema/stool. Per ER: "83 yo F with PMHx as above here with abdominal pain and constipation. Lab work is very reassuring with normal WBC, normal LA, and normal renal function/LFTs. No signs of sepsis. CT imaging obtained and is c/w severe constipation, o/w no acute process. No signs of impaction on exam or on CT scan. UA does show pyuria but this is chronic per records, and pt declines any urinary sx at this time. Pt subsequently treated with miralax, enema x 1 with large BM in ED. She feels markedly improved. Will treat with bowel regimen a thome, encouraged PO hydration, and PRN script for Cipro in event she develops urinary sx. UCx has been sent. Pt in agreement with this plan, and has used PRN scripts for her UTIs in past, understands indications/contraindications."  F/u HTN, hypothyroidism      Outpatient Medications Prior to Visit  Medication Sig Dispense Refill  . ALPHA LIPOIC ACID PO Take 1 tablet by mouth daily.    [name] aspirin 325 MG tablet Take 325 mg by mouth daily.    [name] CRANBERRY PO Take 1 tablet by mouth daily with supper.    . Cyanocobalamin (VITAMIN B-12 PO) Take 1 tablet by mouth at bedtime.    Kayla Davis Calcium (STOOL SOFTENER PO) Take 2 capsules by mouth at bedtime.    [name] EVENING PRIMROSE OIL PO Take 1 capsule by mouth daily.    . fexofenadine (ALLEGRA) 30 MG tablet Take 30 mg by mouth 2 (two) times daily.    [name] glucose blood (ONE TOUCH ULTRA TEST) test strip TEST ONCE DAILY AS DIRECTED Dx E11.9 100 each 5  . ipratropium (ATROVENT) 0.06 % nasal spray Place 2 sprays into both nostrils 3 (three) times daily. 45 mL 1  . Lancets (ONETOUCH ULTRASOFT) lancets Use to check blood sugars once a day Dx E11.9 100 each 3  .  levothyroxine (SYNTHROID, LEVOTHROID) 100 MCG tablet Take 1 tablet (100 mcg total) by mouth daily before breakfast. 90 tablet 1  . losartan (COZAAR) 25 MG tablet Take 1 tablet (25 mg total) by mouth 2 (two) times daily. 60 tablet 0  . LYRICA 75 MG capsule Take 1 capsule (75 mg total) by mouth 2 (two) times daily. 180 capsule 1  . Magnesium 250 MG TABS Take 1 tablet daily by mouth.     . metoprolol tartrate (LOPRESSOR) 25 MG tablet Take 1 tablet (25 mg total) by mouth 2 (two) times daily. 180 tablet 1  . Multiple Vitamins-Minerals (EYE VITAMINS) CAPS Take 1 capsule by mouth daily.    [name] (SYSTANE OP) Apply to eye daily.    . polyethylene glycol (MIRALAX) packet Mix 6 packets in one 32 oz bottle of gatorade, powerade, or other juice. Drink this bottle over the course of a day. If you have not had multiple bowel movements, you can start taking this three times a day, then decrease to once a day once you have liquid bowel movements. 30 each 0  . simvastatin (ZOCOR) 40 MG tablet Take 1 tablet (40 mg total) by mouth daily at 6 PM. 90 tablet 3  . benzonatate (TESSALON) 100 MG capsule Take 1-2 capsules (100-200 mg total) 2 (two) times  daily as needed by mouth for cough. 60 capsule 0   No facility-administered medications prior to visit.     ROS Review of Systems  Constitutional: Negative for activity change, appetite change, chills, fatigue and unexpected weight change.  HENT: Negative for congestion, mouth sores and sinus pressure.   Eyes: Negative for visual disturbance.  Respiratory: Negative for cough and chest tightness.   Gastrointestinal: Positive for abdominal distention and constipation. Negative for abdominal pain and nausea.  Genitourinary: Negative for difficulty urinating, frequency and vaginal pain.  Musculoskeletal: Negative for back pain and gait problem.  Skin: Negative for pallor and rash.  Neurological: Negative for dizziness, tremors, weakness,  numbness and headaches.  Psychiatric/Behavioral: Negative for confusion and sleep disturbance.    Objective:  BP 126/74 (BP Location: Left Arm, Patient Position: Sitting, Cuff Size: Normal)   Pulse 60   Temp 97.8 F (36.6 C) (Oral)   Ht 5\' 5"  (1.651 m)   Wt 158 lb (71.7 kg)   LMP  (LMP Unknown)   SpO2 98%   BMI 26.29 kg/m   BP Readings from Last 3 Encounters:  03/13/17 126/74  03/04/17 129/69  12/24/16 (!) 112/54    Wt Readings from Last 3 Encounters:  03/13/17 158 lb (71.7 kg)  02/23/17 156 lb (70.8 kg)  12/24/16 154 lb (69.9 kg)    Physical Exam  Constitutional: She appears well-developed. No distress.  HENT:  Head: Normocephalic.  Right Ear: External ear normal.  Left Ear: External ear normal.  Nose: Nose normal.  Mouth/Throat: Oropharynx is clear and moist.  Eyes: Conjunctivae are normal. Pupils are equal, round, and reactive to light. Right eye exhibits no discharge. Left eye exhibits no discharge.  Neck: Normal range of motion. Neck supple. No JVD present. No tracheal deviation present. No thyromegaly present.  Cardiovascular: Normal rate, regular rhythm and normal heart sounds.  Pulmonary/Chest: No stridor. No respiratory distress. She has no wheezes.  Abdominal: Soft. Bowel sounds are normal. She exhibits no distension and no mass. There is no tenderness. There is no rebound and no guarding.  Musculoskeletal: She exhibits no edema or tenderness.  Lymphadenopathy:    She has no cervical adenopathy.  Neurological: She displays normal reflexes. No cranial nerve deficit. She exhibits normal muscle tone. Coordination abnormal.  Skin: No rash noted. No erythema.  Psychiatric: She has a normal mood and affect. Her behavior is normal. Judgment and thought content normal.   A walker  Lab Results  Component Value Date   WBC 7.2 03/04/2017   HGB 13.1 03/04/2017   HCT 38.8 03/04/2017   PLT 234 03/04/2017   GLUCOSE 94 03/04/2017   CHOL 162 12/24/2016   TRIG 97.0  12/24/2016   HDL 80.70 12/24/2016   LDLDIRECT 143.0 10/07/2007   LDLCALC 62 12/24/2016   ALT 18 03/04/2017   AST 27 03/04/2017   NA 126 (L) 03/04/2017   K 4.1 03/04/2017   CL 93 (L) 03/04/2017   CREATININE 0.80 03/04/2017   BUN 13 03/04/2017   CO2 24 03/04/2017   TSH 6.38 (H) 12/24/2016   INR 1.06 07/03/2016   HGBA1C 6.6 (H) 12/24/2016    Ct Abdomen Pelvis W Contrast  Result Date: 03/04/2017 CLINICAL DATA:  Abdominal distention and pain. No recent bowel movement for the past several days. EXAM: CT ABDOMEN AND PELVIS WITH CONTRAST TECHNIQUE: Multidetector CT imaging of the abdomen and pelvis was performed using the standard protocol following bolus administration of intravenous contrast. CONTRAST:  148mL ISOVUE-300 IOPAMIDOL (ISOVUE-300) INJECTION 61%  COMPARISON:  CT abdomen pelvis dated October 10, 2005. FINDINGS: Lower chest: No acute abnormality. Bibasilar atelectasis. Cardiomegaly. Coronary calcifications. Extensive mitral valve annular calcifications. Hepatobiliary: No focal liver abnormality is seen. Status post cholecystectomy. No biliary dilatation. Pancreas: Unremarkable. No pancreatic ductal dilatation or surrounding inflammatory changes. Spleen: Normal in size without focal abnormality. Adrenals/Urinary Tract: The adrenal glands are unremarkable. The kidneys are unremarkable. No hydronephrosis. The bladder is partially decompressed. Stomach/Bowel: Stomach is within normal limits. Appendix is not identified, and reportedly surgically absent. No evidence of bowel wall thickening, distention, or inflammatory changes. Scattered left-sided colonic diverticulosis. Large stool burden throughout the colon with stasis in the terminal ileum. Vascular/Lymphatic: Aortic atherosclerosis. No enlarged abdominal or pelvic lymph nodes. Reproductive: Status post hysterectomy. No adnexal masses. Other: No free fluid or pneumoperitoneum. Small fat containing umbilical hernia. Musculoskeletal: Prior left total  hip arthroplasty. Severe degenerative changes of the thoracolumbar spine. Unchanged 9 mm anterolisthesis of L5 on S1 due to bilateral pars defects. Chronic appearing mild superior endplate depressions of the T12 and L3 vertebral bodies. No acute fracture. IMPRESSION: 1.  No acute intra-abdominal process. 2. Large colonic stool burden with stasis in the terminal ileum, consistent with constipation. 3.  Aortic atherosclerosis (ICD10-I70.0). Electronically Signed   By: Titus Dubin M.D.   On: 03/04/2017 12:06    Assessment & Plan:   There are no diagnoses linked to this encounter. I have discontinued Stepfanie Yott. Show's benzonatate. I am also having her maintain her ipratropium, ALPHA LIPOIC ACID PO, EVENING PRIMROSE OIL PO, EYE VITAMINS, CRANBERRY PO, Docusate Calcium (STOOL SOFTENER PO), Cyanocobalamin (VITAMIN B-12 PO), fexofenadine, aspirin, Polyethyl [name] (SYSTANE OP), onetouch ultrasoft, LYRICA, levothyroxine, glucose blood, Magnesium, simvastatin, losartan, polyethylene glycol, and metoprolol tartrate.  No orders of the defined types were placed in this encounter.    Follow-up: No Follow-up on file.  [name], MD

## 2017-03-13 NOTE — Assessment & Plan Note (Signed)
post-ER visit f/u on 11/21: abd pain was due to constipation, Sx's resolved after enema/stool. Per ER: "83 yo F with PMHx as above here with abdominal pain and constipation. Lab work is very reassuring with normal WBC, normal LA, and normal renal function/LFTs. No signs of sepsis. CT imaging obtained and is c/w severe constipation, o/w no acute process. No signs of impaction on exam or on CT scan. UA does show pyuria but this is chronic per records, and pt declines any urinary sx at this time. Pt subsequently treated with miralax, enema x 1 with large BM in ED. She feels markedly improved. Will treat with bowel regimen a thome, encouraged PO hydration, and PRN script for Cipro in event she develops urinary sx. UCx has been sent. Pt in agreement with this plan, and has used PRN scripts for her UTIs in past, understands indications/contraindications." Resolved on Miralax

## 2017-03-13 NOTE — Assessment & Plan Note (Signed)
post-ER visit f/u on 11/21: abd pain was due to constipation, Sx's resolved after enema/stool. Per ER: "83 yo F with PMHx as above here with abdominal pain and constipation. Lab work is very reassuring with normal WBC, normal LA, and normal renal function/LFTs. No signs of sepsis. CT imaging obtained and is c/w severe constipation, o/w no acute process. No signs of impaction on exam or on CT scan. UA does show pyuria but this is chronic per records, and pt declines any urinary sx at this time. Pt subsequently treated with miralax, enema x 1 with large BM in ED. She feels markedly improved. Will treat with bowel regimen a thome, encouraged PO hydration, and PRN script for Cipro in event she develops urinary sx. UCx has been sent. Pt in agreement with this plan, and has used PRN scripts for her UTIs in past, understands indications/contraindications."   Miralax

## 2017-03-14 ENCOUNTER — Emergency Department (HOSPITAL_COMMUNITY)
Admission: EM | Admit: 2017-03-14 | Discharge: 2017-03-15 | Disposition: A | Discharge status: Home / Self Care | Payer: MEDICARE | Attending: Emergency Medicine | Admitting: Emergency Medicine

## 2017-03-14 ENCOUNTER — Encounter (HOSPITAL_COMMUNITY): Payer: Self-pay | Admitting: Emergency Medicine

## 2017-03-14 DIAGNOSIS — M545 Low back pain: Secondary | ICD-10-CM | POA: Diagnosis not present

## 2017-03-14 DIAGNOSIS — Z8673 Personal history of transient ischemic attack (TIA), and cerebral infarction without residual deficits: Secondary | ICD-10-CM | POA: Insufficient documentation

## 2017-03-14 DIAGNOSIS — Y929 Unspecified place or not applicable: Secondary | ICD-10-CM | POA: Insufficient documentation

## 2017-03-14 DIAGNOSIS — I10 Essential (primary) hypertension: Secondary | ICD-10-CM | POA: Insufficient documentation

## 2017-03-14 DIAGNOSIS — Y939 Activity, unspecified: Secondary | ICD-10-CM | POA: Diagnosis not present

## 2017-03-14 DIAGNOSIS — R1030 Lower abdominal pain, unspecified: Secondary | ICD-10-CM | POA: Insufficient documentation

## 2017-03-14 DIAGNOSIS — E119 Type 2 diabetes mellitus without complications: Secondary | ICD-10-CM | POA: Insufficient documentation

## 2017-03-14 DIAGNOSIS — Y999 Unspecified external cause status: Secondary | ICD-10-CM | POA: Insufficient documentation

## 2017-03-14 DIAGNOSIS — M5126 Other intervertebral disc displacement, lumbar region: Secondary | ICD-10-CM | POA: Diagnosis not present

## 2017-03-14 DIAGNOSIS — E039 Hypothyroidism, unspecified: Secondary | ICD-10-CM | POA: Diagnosis not present

## 2017-03-14 DIAGNOSIS — Z950 Presence of cardiac pacemaker: Secondary | ICD-10-CM | POA: Insufficient documentation

## 2017-03-14 DIAGNOSIS — M5416 Radiculopathy, lumbar region: Secondary | ICD-10-CM

## 2017-03-14 DIAGNOSIS — Y33XXXA Other specified events, undetermined intent, initial encounter: Secondary | ICD-10-CM | POA: Diagnosis not present

## 2017-03-14 DIAGNOSIS — S32010A Wedge compression fracture of first lumbar vertebra, initial encounter for closed fracture: Secondary | ICD-10-CM

## 2017-03-14 LAB — CBC
HCT: 36.8 % (ref 36.0–46.0)
HEMOGLOBIN: 12.3 g/dL (ref 12.0–15.0)
MCH: 31.9 pg (ref 26.0–34.0)
MCHC: 33.4 g/dL (ref 30.0–36.0)
MCV: 95.3 fL (ref 78.0–100.0)
Platelets: 260 10*3/uL (ref 150–400)
RBC: 3.86 MIL/uL — AB (ref 3.87–5.11)
RDW: 12.6 % (ref 11.5–15.5)
WBC: 7.4 10*3/uL (ref 4.0–10.5)

## 2017-03-14 LAB — COMPREHENSIVE METABOLIC PANEL
ALBUMIN: 3.9 g/dL (ref 3.5–5.0)
ALT: 19 U/L (ref 14–54)
AST: 24 U/L (ref 15–41)
Alkaline Phosphatase: 95 U/L (ref 38–126)
Anion gap: 10 (ref 5–15)
BUN: 19 mg/dL (ref 6–20)
CALCIUM: 9.1 mg/dL (ref 8.9–10.3)
CHLORIDE: 91 mmol/L — AB (ref 101–111)
CO2: 28 mmol/L (ref 22–32)
CREATININE: 0.86 mg/dL (ref 0.44–1.00)
GFR calc non Af Amer: 57 mL/min — ABNORMAL LOW (ref >60)
GLUCOSE: 125 mg/dL — AB (ref 65–99)
Potassium: 4 mmol/L (ref 3.5–5.1)
SODIUM: 129 mmol/L — AB (ref 135–145)
TOTAL PROTEIN: 6.7 g/dL (ref 6.5–8.1)
Total Bilirubin: 0.6 mg/dL (ref 0.3–1.2)

## 2017-03-14 LAB — LIPASE, BLOOD: LIPASE: 89 U/L — AB (ref 11–51)

## 2017-03-14 MED ORDER — MORPHINE SULFATE (PF) 4 MG/ML IV SOLN
4.0000 mg | Freq: Once | INTRAVENOUS | Status: AC
  Administered 2017-03-14: 4 mg via INTRAVENOUS
  Filled 2017-03-14: qty 1

## 2017-03-14 MED ORDER — KETOROLAC TROMETHAMINE 30 MG/ML IJ SOLN
15.0000 mg | Freq: Once | INTRAMUSCULAR | Status: AC
  Administered 2017-03-14: 15 mg via INTRAVENOUS
  Filled 2017-03-14: qty 1

## 2017-03-14 MED ORDER — DEXAMETHASONE SODIUM PHOSPHATE 10 MG/ML IJ SOLN
10.0000 mg | Freq: Once | INTRAMUSCULAR | Status: AC
  Administered 2017-03-14: 10 mg via INTRAVENOUS
  Filled 2017-03-14: qty 1

## 2017-03-14 NOTE — ED Triage Notes (Signed)
Reports being seen on Thanksgiving for lower abdominal pain that radiates into back.  Reports being diagnosed with constipation and that she feels that's not what it is and the pain is worse.  Per staff patient screamed out in pain when assisted out of the car.  Denies having any urinary symptoms.

## 2017-03-14 NOTE — ED Notes (Signed)
Unable to collect urine in triage due to pain level.

## 2017-03-15 ENCOUNTER — Emergency Department (HOSPITAL_COMMUNITY): Payer: MEDICARE

## 2017-03-15 DIAGNOSIS — M545 Low back pain: Secondary | ICD-10-CM | POA: Diagnosis not present

## 2017-03-15 LAB — URINALYSIS, ROUTINE W REFLEX MICROSCOPIC
BILIRUBIN URINE: NEGATIVE
Glucose, UA: NEGATIVE mg/dL
KETONES UR: NEGATIVE mg/dL
NITRITE: NEGATIVE
PROTEIN: NEGATIVE mg/dL
SPECIFIC GRAVITY, URINE: 1.011 (ref 1.005–1.030)
pH: 7 (ref 5.0–8.0)

## 2017-03-15 MED ORDER — OXYCODONE HCL 5 MG PO TABS: 5.0000 mg | ORAL_TABLET | ORAL | 0 refills | Status: DC | PRN

## 2017-03-15 MED ORDER — OXYCODONE-ACETAMINOPHEN 5-325 MG PO TABS: 1.0000 | ORAL_TABLET | Freq: Once | ORAL | Status: DC

## 2017-03-15 MED ORDER — ACETAMINOPHEN 500 MG PO TABS
1000.0000 mg | ORAL_TABLET | Freq: Once | ORAL | Status: AC
  Administered 2017-03-15: 1000 mg via ORAL
  Filled 2017-03-15: qty 2

## 2017-03-15 MED ORDER — OXYCODONE HCL 5 MG PO TABS
2.5000 mg | ORAL_TABLET | Freq: Once | ORAL | Status: AC
  Administered 2017-03-15: 2.5 mg via ORAL
  Filled 2017-03-15: qty 1

## 2017-03-15 MED ORDER — NAPROXEN 250 MG PO TABS: 250.0000 mg | ORAL_TABLET | Freq: Two times a day (BID) | ORAL | 0 refills | Status: AC

## 2017-03-15 MED ORDER — METHYLPREDNISOLONE 4 MG PO TBPK: ORAL_TABLET | ORAL | 0 refills | Status: DC

## 2017-03-15 NOTE — ED Provider Notes (Addendum)
Assumed care of the patient at 1 AM.  Briefly, the patient is a 83 year old female with history of chronic constipation, back pain, chronic UTIs, who presents with ongoing back pain.  The patient was just treated for severe constipation and states this significantly improved her abdominal pain.  Since then, however, she has had progressive worsening lower back pain.  Pain seems to be consistent with possible lumbosacral radiculopathy.  This worsened after a twisting injury, which is consistent with this.  Lab work is reassuring.  Of note, she has too numerous to count white blood cells, but strongly denies any urinary symptoms.  She is followed regularly by her PCP for this and is only treated when she is symptomatic.  Will hold on treatment at this time.  Patient has pretty severe pain here.  CT imaging is pending.  Patient has had significant improvement in pain with p.o. oxycodone and Tylenol.  She has been able to ambulate at her baseline.  She is requesting to go home, which I feel is reasonable given otherwise reassuring exam.  I discussed her severe arthritis and likely disc herniation on CT scan with the patient and family in detail.  Will start to trial of scheduled NSAIDs, Medrol, as well as oxycodone as needed.  I discussed that she needs to get up and move around the house.  We discussed that if her pain is uncontrolled or if she develops any loss of bowel or bladder function or other concerning symptoms, she should return immediately.  Patient able to ambulate with 1 person assist to get out of bed, then ambulated independently.  She continues to have moderate back pain.  Had a long discussion with the patient as well as her family.  Family is concerned about her pain and mobility.  I discussed this with the patient in detail and recommended admission given her significant pain.  I discussed that she would likely have ongoing pain and subsequent decreased mobility, which could cause pneumonia,  pressure sores, or other significant illness or death.  Patient expresses full understanding and would like to return home.  She does appear improved with analgesia and is able to sit up and ambulate.  Given absence of any other red flags or signs of cauda equina, will not make her leave AMA, but recommended that she return immediately if she is unable to get around at the house.  I will place a consult for social work/PT/OT evaluation as well.   Duffy Bruce, MD 03/15/17 0623    Duffy Bruce, MD 03/15/17 [id number]

## 2017-03-15 NOTE — Discharge Instructions (Signed)
It is very important that you take your stool softeners when taking the Oxycodone as this causes constipation. I would recommend taking MIRALAX at least once to twice a day while taking Oxycodone to prevent constipation.

## 2017-03-15 NOTE — ED Provider Notes (Signed)
[facility] EMERGENCY DEPARTMENT Provider Note   CSN: [id number] Arrival date & time: 03/14/17  2132     History   Chief Complaint Chief Complaint  Patient presents with  . Abdominal Pain  . Back Pain    HPI Kayla Davis is a 83 y.o. female.  HPI Has had problems with back pain but it became very severe tonight.  Pain is fairly low in the back and worse with any movement.  She was unable to ambulate to the car without assistance due to pain.  She denies radiation of pain into her legs.  Family members have been trying to assist but her pain is so severe that she cannot be moved without crying out.  She has been taking medication for constipation.  Last bowel movement 3 days ago. Past Medical History:  Diagnosis Date  . Anemia, unspecified   . Anxiety state, unspecified   . Aortic valve disorders   . Bradycardia    s/p PPM  . Bursitis   . Complication of anesthesia    has had difficulty awakening in past; pt states did well with the anesthesia given during her carotid surgery   . Diabetes mellitus without complication ([place])   . Diverticulosis of colon (without mention of hemorrhage)   . Herpes zoster with other nervous system complications(053.19)   . Hyperlipidemia   . Hypertension   . Hypothyroidism   . Irritable bowel syndrome   . Menopause   . Osteoarthrosis, unspecified whether generalized or localized, unspecified site   . Peripheral vascular disease ([place])   . TIA (transient ischemic attack)   . Unspecified hearing loss   . Varicose veins     Patient Active Problem List   Diagnosis Date Noted  . Abdominal pain 03/13/2017  . Amaurosis 07/03/2016  . Diet-controlled type 2 diabetes mellitus ([place]) 02/29/2016  . Vertigo 12/11/2015  . Gait disorder 06/27/2015  . OA (osteoarthritis) of hip 03/23/2015  . Preop exam for internal medicine 03/04/2015  . Shortness of breath 02/13/2015  . Rhinitis, atrophic 11/22/2014  . Well adult exam  04/21/2014  . Amaurosis fugax 03/31/2014  . History of CEA (carotid endarterectomy) 03/31/2014  . HLD (hyperlipidemia) 03/31/2014  . Carotid stenosis 02/07/2014  . Cerebral infarction ([place]) 01/16/2014  . Dyslipidemia 01/16/2014  . Weakness generalized 04/30/2013  . CAP (community acquired pneumonia) 04/29/2013  . Edema 03/22/2013  . Urinary incontinence in female 03/22/2013  . Constipation 10/01/2012  . UTI (urinary tract infection) 10/01/2012  . Diarrhea 08/26/2012  . Cerumen impaction 12/11/2011  . Pacemaker-Medtronic 07/30/2010  . SINUS BRADYCARDIA 05/17/2010  . DYSURIA 01/29/2010  . Aortic valve disorders 07/31/2009  . Venous (peripheral) insufficiency 07/31/2009  . Hyponatremia 07/01/2009  . LEG CRAMPS 06/12/2009  . LEG PAIN, BILATERAL 02/01/2009  . Backache 08/24/2008  . Anemia 06/21/2008  . DEGENERATIVE JOINT DISEASE 02/19/2008  . CHEST WALL PAIN, HX OF 02/19/2008  . IRRITABLE BOWEL SYNDROME 12/28/2007  . DIABETES MELLITUS, BORDERLINE 11/06/2007  . GASTRITIS 07/08/2007  . POSTHERPETIC NEURALGIA 07/01/2007  . Herpes zoster without mention of complication 36/14/4315  . Hypothyroidism 06/08/2007  . HYPERLIPIDEMIA 06/08/2007  . Anxiety state 06/08/2007  . Hereditary and idiopathic peripheral neuropathy 06/08/2007  . HEARING LOSS 06/08/2007  . Essential hypertension 06/08/2007  . Transient cerebral ischemia 06/08/2007  . BRONCHITIS, RECURRENT 06/08/2007  . DIVERTICULOSIS OF COLON 06/08/2007  . Osteoporosis 06/08/2007    Past Surgical History:  Procedure Laterality Date  . APPENDECTOMY    . CATARACT EXTRACTION,  BILATERAL  end of June 2013  . CHOLECYSTECTOMY    . DILATION AND CURETTAGE OF UTERUS    . ENDARTERECTOMY Right 01/20/2014   Procedure: RIGHT CAROTID ARTERY ENDARTERECTOMY WITH DACRON PATCH ANGIOPLASTY;  Surgeon: Mal Misty, MD;  Location: [facility];  Service: Vascular;  Laterality: Right;  . PACEMAKER INSERTION     MDT dual chamber PPM implanted by Dr  [name] for sinus node dysfunction   . TONSILLECTOMY    . TOTAL HIP ARTHROPLASTY Left 03/23/2015   Procedure: TOTAL HIP ARTHROPLASTY ANTERIOR APPROACH;  Surgeon: Gaynelle Arabian, MD;  Location: [facility];  Service: Orthopedics;  Laterality: Left;  [name] VAGINAL HYSTERECTOMY      OB History    No data available       Home Medications    Prior to Admission medications   Medication Sig Start Date End Date Taking? Authorizing Provider  ALPHA LIPOIC ACID PO Take 1 tablet by mouth daily.   Yes [provider]  aspirin 325 MG tablet Take 325 mg by mouth daily.   Yes [provider]  CRANBERRY PO Take 1 tablet by mouth daily with supper.   Yes [provider]  Cyanocobalamin (VITAMIN B-12 PO) Take 1 tablet by mouth at bedtime.   Yes [provider]  Docusate Calcium (STOOL SOFTENER PO) Take 2 capsules by mouth at bedtime.   Yes [provider]  EVENING PRIMROSE OIL PO Take 1 capsule by mouth daily.   Yes [provider]  fexofenadine (ALLEGRA) 30 MG tablet Take 30 mg by mouth 2 (two) times daily.   Yes [provider]  hydrochlorothiazide (MICROZIDE) 12.5 MG capsule Take 12.5 mg by mouth daily.   Yes [provider]  ipratropium (ATROVENT) 0.06 % nasal spray Place 2 sprays into both nostrils 3 (three) times daily. 02/20/16  Yes Plotnikov, Evie Lacks, MD  levothyroxine (SYNTHROID, LEVOTHROID) 100 MCG tablet Take 1 tablet (100 mcg total) by mouth daily before breakfast. 10/06/16  Yes Plotnikov, Evie Lacks, MD  losartan (COZAAR) 25 MG tablet Take 1 tablet (25 mg total) by mouth 2 (two) times daily. 03/13/17  Yes Plotnikov, Evie Lacks, MD  LYRICA 75 MG capsule Take 1 capsule (75 mg total) by mouth 2 (two) times daily. 09/17/16  Yes Plotnikov, Evie Lacks, MD  Magnesium 250 MG TABS Take 1 tablet daily by mouth.    Yes [provider]  metoprolol tartrate (LOPRESSOR) 25 MG tablet Take 1 tablet (25 mg total) by mouth 2 (two) times daily.  03/09/17 06/07/17 Yes Plotnikov, Evie Lacks, MD  Multiple Vitamins-Minerals (EYE VITAMINS) CAPS Take 1 capsule by mouth daily.   Yes [provider]  Polyethyl [name] (SYSTANE OP) Apply to eye daily.   Yes [provider]  polyethylene glycol (MIRALAX) packet Mix 6 packets in one 32 oz bottle of gatorade, powerade, or other juice. Drink this bottle over the course of a day. If you have not had multiple bowel movements, you can start taking this three times a day, then decrease to once a day once you have liquid bowel movements. Patient taking differently: Take 17 g by mouth once.  03/04/17  Yes Duffy Bruce, MD  simvastatin (ZOCOR) 40 MG tablet Take 1 tablet (40 mg total) by mouth daily at 6 PM. 01/20/17  Yes Plotnikov, Evie Lacks, MD  hyoscyamine (LEVSIN, ANASPAZ) 0.125 MG tablet Take 1-2 tablets (0.125-0.25 mg total) by mouth every 4 (four) hours as needed for up to 10 days. 03/13/17 03/23/17  Plotnikov, Evie Lacks, MD  Lancets [facility] ULTRASOFT) lancets Use to check blood sugars once a day Dx E11.9 Patient not taking: Reported on 03/14/2017 09/17/16   Plotnikov, Evie Lacks, MD    Family History Family History  Problem Relation Age of Onset  . Heart disease Mother   . Heart disease Father   . Diabetes Father   . Cancer Paternal Grandmother     Social History Social History   Tobacco Use  . Smoking status: Never Smoker  . Smokeless tobacco: Never Used  Substance Use Topics  . Alcohol use: No    Alcohol/week: 0.0 oz  . Drug use: No     Allergies   Penicillins; Alprazolam; Bactrim [sulfamethoxazole-trimethoprim]; Ceclor [cefaclor]; Cephalexin; Ciprofloxacin; Clindamycin/lincomycin; Hydrocodone; Noroxin [norfloxacin]; Sulfonamide derivatives; Tetracyclines & related; and Tramadol   Review of Systems Review of Systems 10 Systems reviewed and are negative for acute change except as noted in the HPI.   Physical Exam Updated Vital Signs BP 128/65    Pulse 61   Temp (!) 97.5 F (36.4 C) (Oral)   Resp 18   Ht 5\' 5"  (1.651 m)   Wt 71.7 kg (158 lb)   LMP  (LMP Unknown)   SpO2 95%   BMI 26.29 kg/m   Physical Exam  Constitutional: She is oriented to person, place, and time. She appears well-developed and well-nourished. She appears distressed.  Patient appears to be in pain.  She is alert and nontoxic.  HENT:  Head: Normocephalic and atraumatic.  Eyes: Conjunctivae and EOM are normal.  Neck: Neck supple.  Cardiovascular: Normal rate, regular rhythm, normal heart sounds and intact distal pulses.  No murmur heard. Pulmonary/Chest: Effort normal and breath sounds normal. No respiratory distress.  Abdominal: Soft. She exhibits no distension. There is no tenderness. There is no guarding.  Musculoskeletal: She exhibits tenderness. She exhibits no edema.  Patient has severe pain with trying to sit forward in the stretcher or roll onto her side.  She cries out in pain with these movements.  Ultimately we were able to roll her onto her left side.  There is no significant focal point tenderness but as I move the patient and she is experiencing the pain, it is localizing to the low lumbar sacral spine.  No significant peripheral edema, calves are soft and nontender.  Patient can elevate each leg off of the bed independently and hold it.  Neurological: She is alert and oriented to person, place, and time. No cranial nerve deficit. She exhibits normal muscle tone. Coordination normal.  Patient is not ambulated due to pain.  Skin: Skin is warm and dry.  Psychiatric: She has a normal mood and affect.  Nursing note and vitals reviewed.    ED Treatments / Results  Labs (all labs ordered are listed, but only abnormal results are displayed) Labs Reviewed  LIPASE, BLOOD - Abnormal; Notable for the following components:      Result Value   Lipase 89 (*)    All other components within normal limits  COMPREHENSIVE METABOLIC PANEL - Abnormal; Notable  for the following components:   Sodium 129 (*)    Chloride 91 (*)    Glucose, Bld 125 (*)    GFR calc non Af Amer 57 (*)    All other components within normal limits  CBC - Abnormal; Notable for the following components:   RBC 3.86 (*)    All other components within normal limits  URINALYSIS, ROUTINE W REFLEX MICROSCOPIC    EKG  EKG Interpretation None       Radiology No results found.  Procedures Procedures (including critical care time)  Medications Ordered in ED Medications  morphine 4 MG/ML injection 4 mg (4 mg Intravenous Given 03/14/17 2339)  ketorolac (TORADOL) 30 MG/ML injection 15 mg (15 mg Intravenous Given 03/14/17 2339)  dexamethasone (DECADRON) injection 10 mg (10 mg Intravenous Given 03/14/17 2339)     Initial Impression / Assessment and Plan / ED Course  I have reviewed the triage vital signs and the nursing notes.  Pertinent labs & imaging results that were available during my care of the patient were reviewed by me and considered in my medical decision making (see chart for details).    Patient has gotten significant pain improvement with morphine, Toradol and Decadron.  Plan is for CT lumbar spine the patient has a pacemaker.  Pending findings, plan will be to ambulate the patient and determine if she is able to return home.  She has family members present who are good caregivers.  Dr. Ellender Hose will follow up on CT results and patient reassessment for discharge versus admission.  Final Clinical Impressions(s) / ED Diagnoses   Final diagnoses:  None    ED Discharge Orders    None       Charlesetta Shanks, MD 03/15/17 [phone]

## 2017-03-31 DIAGNOSIS — S32030A Wedge compression fracture of third lumbar vertebra, initial encounter for closed fracture: Secondary | ICD-10-CM | POA: Diagnosis not present

## 2017-03-31 DIAGNOSIS — Z6830 Body mass index (BMI) 30.0-30.9, adult: Secondary | ICD-10-CM | POA: Diagnosis not present

## 2017-03-31 DIAGNOSIS — I10 Essential (primary) hypertension: Secondary | ICD-10-CM | POA: Diagnosis not present

## 2017-04-01 ENCOUNTER — Ambulatory Visit: Payer: MEDICARE | Admitting: Internal Medicine

## 2017-04-04 ENCOUNTER — Other Ambulatory Visit: Payer: Self-pay | Admitting: Internal Medicine

## 2017-04-04 DIAGNOSIS — I10 Essential (primary) hypertension: Secondary | ICD-10-CM

## 2017-04-16 ENCOUNTER — Ambulatory Visit (INDEPENDENT_AMBULATORY_CARE_PROVIDER_SITE_OTHER): Payer: MEDICARE | Admitting: *Deleted

## 2017-04-16 ENCOUNTER — Telehealth: Payer: Self-pay | Admitting: Cardiology

## 2017-04-16 DIAGNOSIS — I495 Sick sinus syndrome: Secondary | ICD-10-CM | POA: Diagnosis not present

## 2017-04-16 NOTE — Telephone Encounter (Signed)
Attempted to confirm remote transmission with pt. No answer and was unable to leave a message.   

## 2017-04-17 ENCOUNTER — Telehealth: Payer: Self-pay | Admitting: Internal Medicine

## 2017-04-17 NOTE — Telephone Encounter (Signed)
New Message  Pt c/o swelling: STAT is pt has developed SOB within 24 hours  1) How much weight have you gained and in what time span? Per pt says she gain some weight over the christmas break but does not think its because of the swelling   2) If swelling, where is the swelling located? Both feet  3) Are you currently taking a fluid pill? Yes  4) Are you currently SOB? No   5) Do you have a log of your daily weights (if so, list)? no  6) Have you gained 3 pounds in a day or 5 pounds in a week? no  7) Have you traveled recently? No

## 2017-04-17 NOTE — Progress Notes (Signed)
Remote pacemaker transmission.   

## 2017-04-17 NOTE — Telephone Encounter (Signed)
Returned call to the patient. Patient c/o bilateral feet and ankle swelling for the past 3 weeks. She normally has some swelling that is usually resolved by the morning except for past 2 weeks. She did say that during the holidays her sodium intake has increased and she started taking HCTZ 12.5 mg once a day for the past week. She has not weighed herself. Patient denied sob, dizziness, lightheadedness, or chest pain. Advised patient to keep legs elevated during the day to help with swelling and monitor sodium intake. Informed patient that I would forward to Dr. [name] for further recommendations regarding HCTZ. Patient in agreement with plan, verbalized understanding and thanked me for the call.

## 2017-04-20 ENCOUNTER — Encounter: Payer: Self-pay | Admitting: Cardiology

## 2017-04-20 NOTE — Telephone Encounter (Signed)
I spoke with pt who reports since restarting HCTZ 12.5 mg daily her swelling has improved. She thinks she needs to take this everyday and is asking if this is OK with Dr. [name]. (was stopped at her request at last office visit).  If OK with Dr. [name] to resume she will need prescription sent to [facility] mail order.

## 2017-04-20 NOTE — Telephone Encounter (Signed)
Follow up    Patient called today , she has lowered her sodium intake and the swelling has went down she does not wish to increase her medication at this time.  She has resumed her exercises.  She wishes to resume the hydrochlorothiazide (MICROZIDE) 12.5 MG capsule, if this ok with you , she will need a prescription called into [facility] mail order 90 days

## 2017-04-28 DIAGNOSIS — B0229 Other postherpetic nervous system involvement: Secondary | ICD-10-CM | POA: Diagnosis not present

## 2017-04-28 DIAGNOSIS — I10 Essential (primary) hypertension: Secondary | ICD-10-CM | POA: Diagnosis not present

## 2017-04-28 DIAGNOSIS — E1151 Type 2 diabetes mellitus with diabetic peripheral angiopathy without gangrene: Secondary | ICD-10-CM | POA: Diagnosis not present

## 2017-04-28 DIAGNOSIS — R42 Dizziness and giddiness: Secondary | ICD-10-CM | POA: Diagnosis not present

## 2017-04-28 DIAGNOSIS — M545 Low back pain: Secondary | ICD-10-CM | POA: Diagnosis not present

## 2017-04-28 DIAGNOSIS — M1990 Unspecified osteoarthritis, unspecified site: Secondary | ICD-10-CM | POA: Diagnosis not present

## 2017-04-28 LAB — CUP PACEART REMOTE DEVICE CHECK
Battery Impedance: 594 Ohm
Battery Voltage: 2.78 V
Brady Statistic AP VP Percent: 1 %
Brady Statistic AP VS Percent: 89 %
Implantable Lead Implant Date: 20111031
Implantable Lead Location: 753859
Implantable Lead Model: 4469
Implantable Lead Model: 4470
Implantable Lead Serial Number: 548557
Implantable Lead Serial Number: 686076
Lead Channel Impedance Value: 465 Ohm
Lead Channel Impedance Value: 497 Ohm
Lead Channel Pacing Threshold Amplitude: 0.5 V
Lead Channel Pacing Threshold Pulse Width: 0.4 ms
Lead Channel Setting Pacing Amplitude: 2.5 V
Lead Channel Setting Pacing Pulse Width: 0.4 ms
Lead Channel Setting Sensing Sensitivity: 4 mV
MDC IDC LEAD IMPLANT DT: 20111031
MDC IDC LEAD LOCATION: 753860
MDC IDC MSMT BATTERY REMAINING LONGEVITY: 82 mo
MDC IDC MSMT LEADCHNL RV PACING THRESHOLD AMPLITUDE: 0.625 V
MDC IDC MSMT LEADCHNL RV PACING THRESHOLD PULSEWIDTH: 0.4 ms
MDC IDC PG IMPLANT DT: 20111031
MDC IDC SESS DTM: 20190104003829
MDC IDC SET LEADCHNL RA PACING AMPLITUDE: 2 V
MDC IDC STAT BRADY AS VP PERCENT: 0 %
MDC IDC STAT BRADY AS VS PERCENT: 10 %

## 2017-04-30 DIAGNOSIS — M545 Low back pain: Secondary | ICD-10-CM | POA: Diagnosis not present

## 2017-04-30 DIAGNOSIS — B0229 Other postherpetic nervous system involvement: Secondary | ICD-10-CM | POA: Diagnosis not present

## 2017-04-30 DIAGNOSIS — M1990 Unspecified osteoarthritis, unspecified site: Secondary | ICD-10-CM | POA: Diagnosis not present

## 2017-04-30 DIAGNOSIS — I10 Essential (primary) hypertension: Secondary | ICD-10-CM | POA: Diagnosis not present

## 2017-04-30 DIAGNOSIS — R42 Dizziness and giddiness: Secondary | ICD-10-CM | POA: Diagnosis not present

## 2017-04-30 DIAGNOSIS — E1151 Type 2 diabetes mellitus with diabetic peripheral angiopathy without gangrene: Secondary | ICD-10-CM | POA: Diagnosis not present

## 2017-05-01 MED ORDER — HYDROCHLOROTHIAZIDE 12.5 MG PO CAPS: 12.5000 mg | ORAL_CAPSULE | Freq: Every day | ORAL | 3 refills | Status: DC

## 2017-05-01 NOTE — Telephone Encounter (Signed)
Discussed with Dr [name] and he is okay with her taking HCTZ 12.5 mg daily.  90 day supply sent to mail order and patient aware

## 2017-05-05 DIAGNOSIS — M1990 Unspecified osteoarthritis, unspecified site: Secondary | ICD-10-CM | POA: Diagnosis not present

## 2017-05-05 DIAGNOSIS — E1151 Type 2 diabetes mellitus with diabetic peripheral angiopathy without gangrene: Secondary | ICD-10-CM | POA: Diagnosis not present

## 2017-05-05 DIAGNOSIS — R42 Dizziness and giddiness: Secondary | ICD-10-CM | POA: Diagnosis not present

## 2017-05-05 DIAGNOSIS — I10 Essential (primary) hypertension: Secondary | ICD-10-CM | POA: Diagnosis not present

## 2017-05-05 DIAGNOSIS — B0229 Other postherpetic nervous system involvement: Secondary | ICD-10-CM | POA: Diagnosis not present

## 2017-05-05 DIAGNOSIS — M545 Low back pain: Secondary | ICD-10-CM | POA: Diagnosis not present

## 2017-05-07 DIAGNOSIS — E1151 Type 2 diabetes mellitus with diabetic peripheral angiopathy without gangrene: Secondary | ICD-10-CM | POA: Diagnosis not present

## 2017-05-07 DIAGNOSIS — M1990 Unspecified osteoarthritis, unspecified site: Secondary | ICD-10-CM | POA: Diagnosis not present

## 2017-05-07 DIAGNOSIS — R42 Dizziness and giddiness: Secondary | ICD-10-CM | POA: Diagnosis not present

## 2017-05-07 DIAGNOSIS — B0229 Other postherpetic nervous system involvement: Secondary | ICD-10-CM | POA: Diagnosis not present

## 2017-05-07 DIAGNOSIS — M545 Low back pain: Secondary | ICD-10-CM | POA: Diagnosis not present

## 2017-05-07 DIAGNOSIS — I10 Essential (primary) hypertension: Secondary | ICD-10-CM | POA: Diagnosis not present

## 2017-05-11 DIAGNOSIS — E1151 Type 2 diabetes mellitus with diabetic peripheral angiopathy without gangrene: Secondary | ICD-10-CM | POA: Diagnosis not present

## 2017-05-11 DIAGNOSIS — B0229 Other postherpetic nervous system involvement: Secondary | ICD-10-CM | POA: Diagnosis not present

## 2017-05-11 DIAGNOSIS — M545 Low back pain: Secondary | ICD-10-CM | POA: Diagnosis not present

## 2017-05-11 DIAGNOSIS — M1990 Unspecified osteoarthritis, unspecified site: Secondary | ICD-10-CM | POA: Diagnosis not present

## 2017-05-11 DIAGNOSIS — R42 Dizziness and giddiness: Secondary | ICD-10-CM | POA: Diagnosis not present

## 2017-05-11 DIAGNOSIS — I10 Essential (primary) hypertension: Secondary | ICD-10-CM | POA: Diagnosis not present

## 2017-05-13 DIAGNOSIS — R42 Dizziness and giddiness: Secondary | ICD-10-CM | POA: Diagnosis not present

## 2017-05-13 DIAGNOSIS — B0229 Other postherpetic nervous system involvement: Secondary | ICD-10-CM | POA: Diagnosis not present

## 2017-05-13 DIAGNOSIS — I10 Essential (primary) hypertension: Secondary | ICD-10-CM | POA: Diagnosis not present

## 2017-05-13 DIAGNOSIS — M545 Low back pain: Secondary | ICD-10-CM | POA: Diagnosis not present

## 2017-05-13 DIAGNOSIS — E1151 Type 2 diabetes mellitus with diabetic peripheral angiopathy without gangrene: Secondary | ICD-10-CM | POA: Diagnosis not present

## 2017-05-13 DIAGNOSIS — M1990 Unspecified osteoarthritis, unspecified site: Secondary | ICD-10-CM | POA: Diagnosis not present

## 2017-05-18 DIAGNOSIS — M1990 Unspecified osteoarthritis, unspecified site: Secondary | ICD-10-CM | POA: Diagnosis not present

## 2017-05-18 DIAGNOSIS — B0229 Other postherpetic nervous system involvement: Secondary | ICD-10-CM | POA: Diagnosis not present

## 2017-05-18 DIAGNOSIS — M545 Low back pain: Secondary | ICD-10-CM | POA: Diagnosis not present

## 2017-05-18 DIAGNOSIS — I10 Essential (primary) hypertension: Secondary | ICD-10-CM | POA: Diagnosis not present

## 2017-05-18 DIAGNOSIS — E1151 Type 2 diabetes mellitus with diabetic peripheral angiopathy without gangrene: Secondary | ICD-10-CM | POA: Diagnosis not present

## 2017-05-18 DIAGNOSIS — R42 Dizziness and giddiness: Secondary | ICD-10-CM | POA: Diagnosis not present

## 2017-05-20 DIAGNOSIS — I10 Essential (primary) hypertension: Secondary | ICD-10-CM | POA: Diagnosis not present

## 2017-05-20 DIAGNOSIS — B0229 Other postherpetic nervous system involvement: Secondary | ICD-10-CM | POA: Diagnosis not present

## 2017-05-20 DIAGNOSIS — M1990 Unspecified osteoarthritis, unspecified site: Secondary | ICD-10-CM | POA: Diagnosis not present

## 2017-05-20 DIAGNOSIS — E1151 Type 2 diabetes mellitus with diabetic peripheral angiopathy without gangrene: Secondary | ICD-10-CM | POA: Diagnosis not present

## 2017-05-20 DIAGNOSIS — R42 Dizziness and giddiness: Secondary | ICD-10-CM | POA: Diagnosis not present

## 2017-05-20 DIAGNOSIS — M545 Low back pain: Secondary | ICD-10-CM | POA: Diagnosis not present

## 2017-05-25 DIAGNOSIS — I10 Essential (primary) hypertension: Secondary | ICD-10-CM | POA: Diagnosis not present

## 2017-05-25 DIAGNOSIS — E1151 Type 2 diabetes mellitus with diabetic peripheral angiopathy without gangrene: Secondary | ICD-10-CM | POA: Diagnosis not present

## 2017-05-25 DIAGNOSIS — B0229 Other postherpetic nervous system involvement: Secondary | ICD-10-CM | POA: Diagnosis not present

## 2017-05-25 DIAGNOSIS — R42 Dizziness and giddiness: Secondary | ICD-10-CM | POA: Diagnosis not present

## 2017-05-25 DIAGNOSIS — M1990 Unspecified osteoarthritis, unspecified site: Secondary | ICD-10-CM | POA: Diagnosis not present

## 2017-05-25 DIAGNOSIS — M545 Low back pain: Secondary | ICD-10-CM | POA: Diagnosis not present

## 2017-05-26 DIAGNOSIS — S32030A Wedge compression fracture of third lumbar vertebra, initial encounter for closed fracture: Secondary | ICD-10-CM | POA: Diagnosis not present

## 2017-05-26 DIAGNOSIS — S32010D Wedge compression fracture of first lumbar vertebra, subsequent encounter for fracture with routine healing: Secondary | ICD-10-CM | POA: Diagnosis not present

## 2017-05-26 DIAGNOSIS — Z6830 Body mass index (BMI) 30.0-30.9, adult: Secondary | ICD-10-CM | POA: Diagnosis not present

## 2017-05-27 DIAGNOSIS — I10 Essential (primary) hypertension: Secondary | ICD-10-CM | POA: Diagnosis not present

## 2017-05-27 DIAGNOSIS — R42 Dizziness and giddiness: Secondary | ICD-10-CM | POA: Diagnosis not present

## 2017-05-27 DIAGNOSIS — M545 Low back pain: Secondary | ICD-10-CM | POA: Diagnosis not present

## 2017-05-27 DIAGNOSIS — M1990 Unspecified osteoarthritis, unspecified site: Secondary | ICD-10-CM | POA: Diagnosis not present

## 2017-05-27 DIAGNOSIS — B0229 Other postherpetic nervous system involvement: Secondary | ICD-10-CM | POA: Diagnosis not present

## 2017-05-27 DIAGNOSIS — E1151 Type 2 diabetes mellitus with diabetic peripheral angiopathy without gangrene: Secondary | ICD-10-CM | POA: Diagnosis not present

## 2017-05-29 ENCOUNTER — Telehealth: Payer: Self-pay | Admitting: Internal Medicine

## 2017-05-29 DIAGNOSIS — I10 Essential (primary) hypertension: Secondary | ICD-10-CM

## 2017-05-29 MED ORDER — LOSARTAN POTASSIUM 25 MG PO TABS: 25.0000 mg | ORAL_TABLET | Freq: Two times a day (BID) | ORAL | 3 refills | Status: DC

## 2017-05-29 MED ORDER — METOPROLOL TARTRATE 25 MG PO TABS: 25.0000 mg | ORAL_TABLET | Freq: Two times a day (BID) | ORAL | 1 refills | Status: DC

## 2017-05-29 MED ORDER — LEVOTHYROXINE SODIUM 100 MCG PO TABS: 100.0000 ug | ORAL_TABLET | Freq: Every day | ORAL | 1 refills | Status: DC

## 2017-05-29 NOTE — Telephone Encounter (Signed)
Copied from [place] [id number]. Topic: Quick Communication - See Telephone Encounter >> May 29, 2017  1:28 PM Oneta Rack wrote: CRM for notification. See Telephone encounter for:   05/29/17.  Relation to pt: self  Call back number: [phone] Pharmacy: [facility], [place] to [facility] [phone] (Phone) [phone] (Fax)   Reason for call:  Patient spoke with mail order today and would like to ensure PCP sends 90 day supply  hydrochlorothiazide (MICROZIDE) 12.5 MG capsule, metoprolol tartrate (LOPRESSOR) 25 MG tablet, losartan (COZAAR) 25 MG tablet, levothyroxine (SYNTHROID, LEVOTHROID) 100 MCG tablet  due to the cost being cheaper, please advise >> May 29, 2017  1:45 PM Oneta Rack wrote: CRM for notification. See Telephone encounter for:   05/29/17.  Relation to pt: self  Call back number: [phone] Pharmacy: [facility], [place] to [facility] [phone] (Phone) [phone] (Fax)   Reason for call:  Patient spoke with mail order today and would like to ensure PCP sends 90 day supply  hydrochlorothiazide (MICROZIDE) 12.5 MG capsule, metoprolol tartrate (LOPRESSOR) 25 MG tablet, losartan (COZAAR) 25 MG tablet, levothyroxine (SYNTHROID, LEVOTHROID) 100 MCG tablet  due to the cost being cheaper, please advise

## 2017-05-29 NOTE — Telephone Encounter (Signed)
RX sent

## 2017-05-29 NOTE — Addendum Note (Signed)
Addended by: Karren Cobble on: 05/29/2017 04:33 PM   Modules accepted: Orders

## 2017-06-01 DIAGNOSIS — M545 Low back pain: Secondary | ICD-10-CM | POA: Diagnosis not present

## 2017-06-01 DIAGNOSIS — M1990 Unspecified osteoarthritis, unspecified site: Secondary | ICD-10-CM | POA: Diagnosis not present

## 2017-06-01 DIAGNOSIS — R42 Dizziness and giddiness: Secondary | ICD-10-CM | POA: Diagnosis not present

## 2017-06-01 DIAGNOSIS — E1151 Type 2 diabetes mellitus with diabetic peripheral angiopathy without gangrene: Secondary | ICD-10-CM | POA: Diagnosis not present

## 2017-06-01 DIAGNOSIS — B0229 Other postherpetic nervous system involvement: Secondary | ICD-10-CM | POA: Diagnosis not present

## 2017-06-01 DIAGNOSIS — I10 Essential (primary) hypertension: Secondary | ICD-10-CM | POA: Diagnosis not present

## 2017-06-02 ENCOUNTER — Ambulatory Visit (HOSPITAL_COMMUNITY)
Admission: RE | Admit: 2017-06-02 | Discharge: 2017-06-02 | Disposition: A | Discharge status: Home / Self Care | Payer: MEDICARE | Source: Ambulatory Visit | Attending: Family | Admitting: Family

## 2017-06-02 ENCOUNTER — Ambulatory Visit (INDEPENDENT_AMBULATORY_CARE_PROVIDER_SITE_OTHER): Payer: MEDICARE | Admitting: Family

## 2017-06-02 ENCOUNTER — Encounter: Payer: Self-pay | Admitting: Family

## 2017-06-02 VITALS — BP 128/69 | HR 72 | Temp 97.2°F | Resp 18 | Ht 65.0 in | Wt 160.3 lb

## 2017-06-02 DIAGNOSIS — Z9889 Other specified postprocedural states: Secondary | ICD-10-CM | POA: Diagnosis not present

## 2017-06-02 DIAGNOSIS — I6521 Occlusion and stenosis of right carotid artery: Secondary | ICD-10-CM | POA: Diagnosis not present

## 2017-06-02 DIAGNOSIS — E119 Type 2 diabetes mellitus without complications: Secondary | ICD-10-CM | POA: Diagnosis not present

## 2017-06-02 DIAGNOSIS — I10 Essential (primary) hypertension: Secondary | ICD-10-CM | POA: Insufficient documentation

## 2017-06-02 DIAGNOSIS — E785 Hyperlipidemia, unspecified: Secondary | ICD-10-CM | POA: Insufficient documentation

## 2017-06-02 DIAGNOSIS — Z87891 Personal history of nicotine dependence: Secondary | ICD-10-CM | POA: Diagnosis not present

## 2017-06-02 DIAGNOSIS — I6522 Occlusion and stenosis of left carotid artery: Secondary | ICD-10-CM | POA: Insufficient documentation

## 2017-06-02 LAB — VAS US CAROTID
LCCADDIAS: -14 cm/s
LICADDIAS: -10 cm/s
LICAPDIAS: -15 cm/s
LICAPSYS: -90 cm/s
Left CCA dist sys: -73 cm/s
Left CCA prox dias: 17 cm/s
Left CCA prox sys: 89 cm/s
Left ICA dist sys: -44 cm/s
RCCAPSYS: 70 cm/s
RIGHT CCA MID DIAS: 12 cm/s
RIGHT VERTEBRAL DIAS: -11 cm/s
Right CCA prox dias: 9 cm/s
Right cca dist sys: -66 cm/s

## 2017-06-02 NOTE — Progress Notes (Signed)
Chief Complaint: Follow up Extracranial Carotid Artery Stenosis   History of Present Illness  Kayla Davis is a 84 y.o. female who is status post right carotid endarterectomy on 01/20/2014 by Dr. Kellie Simmering; she had episodes of amaurosis fugax in her right eye. Her right eye symptoms have completely resolved since her surgery.  She presented to the ED in March 2018 with left eye amaurosis fugax. She was evaluated by Dr. Leta Baptist; his note from April 2018 indicates that pt had amaurosis fugax left eye, TIA. She was to follow up with him as needed.   She had a left total hip replacement on 03/23/15.  She has compression fractures in one lumbar spine vertebra, and HNP.   She has a history of recurrent UTI's.  She states she has been more short of breath since November 2018, she has had back problems since then, is receiving home physical therapy.   Pt Diabetic: yes, 6.6 A1C on 12-24-16 (review of records) Pt smoker: non-smoker  Pt meds include: Statin : yes, 325 mg daily since 2018 ASA: yes Other anticoagulants/antiplatelets: no, she could not tolerate one of the anticoagulants, possibly warfarin    Past Medical History:  Diagnosis Date  . Anemia, unspecified   . Anxiety state, unspecified   . Aortic valve disorders   . Bradycardia    s/p PPM  . Bursitis   . Complication of anesthesia    has had difficulty awakening in past; pt states did well with the anesthesia given during her carotid surgery   . Diabetes mellitus without complication ([place])   . Diverticulosis of colon (without mention of hemorrhage)   . Herpes zoster with other nervous system complications(053.19)   . Hyperlipidemia   . Hypertension   . Hypothyroidism   . Irritable bowel syndrome   . Menopause   . Osteoarthrosis, unspecified whether generalized or localized, unspecified site   . Peripheral vascular disease ([place])   . TIA (transient ischemic attack)   . Unspecified hearing loss   . Varicose veins      Social History Social History   Tobacco Use  . Smoking status: Never Smoker  . Smokeless tobacco: Never Used  Substance Use Topics  . Alcohol use: No    Alcohol/week: 0.0 oz  . Drug use: No    Family History Family History  Problem Relation Age of Onset  . Heart disease Mother   . Heart disease Father   . Diabetes Father   . Cancer Paternal Grandmother     Surgical History Past Surgical History:  Procedure Laterality Date  . APPENDECTOMY    . CATARACT EXTRACTION, BILATERAL  end of June 2013  . CHOLECYSTECTOMY    . DILATION AND CURETTAGE OF UTERUS    . ENDARTERECTOMY Right 01/20/2014   Procedure: RIGHT CAROTID ARTERY ENDARTERECTOMY WITH DACRON PATCH ANGIOPLASTY;  Surgeon: Mal Misty, MD;  Location: [place];  Service: Vascular;  Laterality: Right;  . PACEMAKER INSERTION     MDT dual chamber PPM implanted by Dr [name] for sinus node dysfunction   . TONSILLECTOMY    . TOTAL HIP ARTHROPLASTY Left 03/23/2015   Procedure: TOTAL HIP ARTHROPLASTY ANTERIOR APPROACH;  Surgeon: Gaynelle Arabian, MD;  Location: [facility];  Service: Orthopedics;  Laterality: Left;  [name] VAGINAL HYSTERECTOMY      Allergies  Allergen Reactions  . Penicillins Swelling and Other (See Comments)    BODY SWELLS & PT STATES THAT SHE ALMOST DIED Has patient had a PCN reaction causing immediate rash,  facial/tongue/throat swelling, SOB or lightheadedness with hypotension: Yes Has patient had a PCN reaction causing severe rash involving mucus membranes or skin necrosis: known Has patient had a PCN reaction that required hospitalization Yes Has patient had a PCN reaction occurring within the last 10 years: No If all of the above answers are "NO", then may proceed with Cephalosporin u  . Alprazolam Swelling    EYES SWELL  . Bactrim [Sulfamethoxazole-Trimethoprim]     Unable to remember the side effects  . Ceclor [Cefaclor]     Unsure of the side effects  . Cephalexin     Unsure of the side effects  .  Ciprofloxacin Itching and Swelling    Pt states that she tolerated po cipro well, but not the higher dose given IV  . Clindamycin/Lincomycin Nausea Only    Upset stomach  . Hydrocodone     Unsure of reaction to med  . Noroxin [Norfloxacin]     Unsure of side effects  . Sulfonamide Derivatives Itching  . Tetracyclines & Related     Unsure of the reaction to this medication  . Tramadol Other (See Comments)    Did not like how she "felt"    Current Outpatient Medications  Medication Sig Dispense Refill  . ALPHA LIPOIC ACID PO Take 1 tablet by mouth daily.    [name] aspirin 325 MG tablet Take 325 mg by mouth daily.    [name] CRANBERRY PO Take 1 tablet by mouth daily with supper.    . Cyanocobalamin (VITAMIN B-12 PO) Take 1 tablet by mouth at bedtime.    [name] Calcium (STOOL SOFTENER PO) Take 2 capsules by mouth at bedtime.    [name] EVENING PRIMROSE OIL PO Take 1 capsule by mouth daily.    . fexofenadine (ALLEGRA) 30 MG tablet Take 30 mg by mouth 2 (two) times daily.    . hydrochlorothiazide (MICROZIDE) 12.5 MG capsule Take 1 capsule (12.5 mg total) by mouth daily. 90 capsule 3  . ipratropium (ATROVENT) 0.06 % nasal spray Place 2 sprays into both nostrils 3 (three) times daily. 45 mL 1  . Lancets (ONETOUCH ULTRASOFT) lancets Use to check blood sugars once a day Dx E11.9 100 each 3  . levothyroxine (SYNTHROID, LEVOTHROID) 100 MCG tablet Take 1 tablet (100 mcg total) by mouth daily before breakfast. 90 tablet 1  . losartan (COZAAR) 25 MG tablet TAKE 1 TABLET TWICE A DAY 180 tablet 3  . losartan (COZAAR) 25 MG tablet Take 1 tablet (25 mg total) by mouth 2 (two) times daily. 180 tablet 3  . LYRICA 75 MG capsule TAKE 1 CAPSULE TWICE DAILY 180 capsule 3  . Magnesium 250 MG TABS Take 1 tablet daily by mouth.     . methylPREDNISolone (MEDROL DOSEPAK) 4 MG TBPK tablet Take as directed on package 21 tablet 0  . metoprolol tartrate (LOPRESSOR) 25 MG tablet Take 1 tablet (25 mg total) by mouth 2 (two) times  daily. 180 tablet 1  . Multiple Vitamins-Minerals (EYE VITAMINS) CAPS Take 1 capsule by mouth daily.    [name] oxyCODONE (ROXICODONE) 5 MG immediate release tablet Take 1 tablet (5 mg total) by mouth every 4 (four) hours as needed for moderate pain or severe pain (Take one half tablet for moderate pain, one full tablet for severe pain). 20 tablet 0  . Polyethyl [name] (SYSTANE OP) Apply to eye daily.    . polyethylene glycol (MIRALAX) packet Mix 6 packets in one 32 oz bottle of gatorade, powerade, or  other juice. Drink this bottle over the course of a day. If you have not had multiple bowel movements, you can start taking this three times a day, then decrease to once a day once you have liquid bowel movements. (Patient taking differently: Take 17 g by mouth once. ) 30 each 0  . simvastatin (ZOCOR) 40 MG tablet Take 1 tablet (40 mg total) by mouth daily at 6 PM. 90 tablet 3  . hyoscyamine (LEVSIN, ANASPAZ) 0.125 MG tablet Take 1-2 tablets (0.125-0.25 mg total) by mouth every 4 (four) hours as needed for up to 10 days. 100 tablet 1   No current facility-administered medications for this visit.     Review of Systems : See HPI for pertinent positives and negatives.  Physical Examination  Vitals:   06/02/17 1440 06/02/17 1443  BP: 131/63 128/69  Pulse: 72   Resp: 18   Temp: (!) 97.2 F (36.2 C)   TempSrc: Oral   SpO2: 98%   Weight: 160 lb 4.8 oz (72.7 kg)   Height: 5\' 5"  (1.651 m)    Body mass index is 26.68 kg/m.  General: WDWN elderly female in NAD GAIT:using walker, steady, slow HENT: No gross abnormalities  Eyes: PERRLA Pulmonary: Respirations are non-labored at rest, fine rales in right posterior fields, 2/3 way up. No rhonchi or wheezes.  Cardiac: regular rhythm, no detected murmur. Pacemaker palpated right upper anterior chest.   VASCULAR EXAM Carotid Bruits Right Left   Negative Negative   Abdominal aortic pulse is not palpable. Radial pulses are 2+  palpable and equal.   LE Pulses Right Left  POPLITEAL not palpable  not palpable       POSTERIOR TIBIAL   not palpable  palpable       DORSALIS PEDIS   palpable   Faintly palpable     Gastrointestinal: soft, nontender, BS WNL, no r/g, no palpable masses. Musculoskeletal: no muscle atrophy/wasting. M/S 5/5 in upper extremities, 4/5 in lower extremities, extremities without ischemic changes. Skin: No rash, no cellulitis, no ulcers.  Neurologic: A&O X 3; appropriate affect, speech is normal, CN 2-12 intact except is hard of hearing, pain and light touch intact in extremities, Motor exam as listed above. Resting tremor in mandible.  Psychiatric: Normal thought content, mood appropriate to clinical situation.    Assessment: Kayla Davis is a 84 y.o. female who is status post right carotid endarterectomy on 01/20/2014; preoperatively she had episodes of amaurosis fugax in her right eye.   She had left eye amaurosis fugax in March 2018, thorough evaluation concluded that pt had a left eye TIA.   No subsequent TIA's or strokes.   Her atherosclerotic risk factors include well controlled DM and advanced age. Fortunately she has never used tobacco.  She takes 325 mg aspirin per day and a statin.  I advised pt, daughter, and son to notify her PCP that she has been more short of breath since about November 2018, and I hear fine rales in her right posterior fields, 2/3 the way up. Her SaO2 is normal at 98% on room air.    DATA  Carotid Duplex (06/02/17): Right ICA: (CEA site) no restenosis Left ICA: 1-39% stenosis Bilateral vertebral artery flow is antegrade.  Bilateral subclavian artery waveforms are normal.  No change since the exams on 09-12-14, 05-31-15, and 06-05-16.    Plan: Follow-up in 1 year with Carotid Duplex scan.   I discussed in depth with the patient the nature of atherosclerosis, and emphasized the  importance of maximal medical  management including strict control of blood pressure, blood glucose, and lipid levels, obtaining regular exercise, and continued cessation of smoking.  The patient is aware that without maximal medical management the underlying atherosclerotic disease process will progress, limiting the benefit of any interventions. The patient was given information about stroke prevention and what symptoms should prompt the patient to seek immediate medical care. Thank you for allowing [place] to participate in this patient's care.  Clemon Chambers, RN, MSN, FNP-C [facility] Office: [phone]  Clinic Physician: Early  06/02/17 2:45 PM

## 2017-06-02 NOTE — Patient Instructions (Signed)

## 2017-06-03 DIAGNOSIS — I10 Essential (primary) hypertension: Secondary | ICD-10-CM | POA: Diagnosis not present

## 2017-06-03 DIAGNOSIS — R42 Dizziness and giddiness: Secondary | ICD-10-CM | POA: Diagnosis not present

## 2017-06-03 DIAGNOSIS — E1151 Type 2 diabetes mellitus with diabetic peripheral angiopathy without gangrene: Secondary | ICD-10-CM | POA: Diagnosis not present

## 2017-06-03 DIAGNOSIS — M545 Low back pain: Secondary | ICD-10-CM | POA: Diagnosis not present

## 2017-06-03 DIAGNOSIS — B0229 Other postherpetic nervous system involvement: Secondary | ICD-10-CM | POA: Diagnosis not present

## 2017-06-03 DIAGNOSIS — M1990 Unspecified osteoarthritis, unspecified site: Secondary | ICD-10-CM | POA: Diagnosis not present

## 2017-06-05 ENCOUNTER — Ambulatory Visit: Payer: MEDICARE | Admitting: Family

## 2017-06-05 ENCOUNTER — Encounter (HOSPITAL_COMMUNITY): Payer: MEDICARE

## 2017-06-08 ENCOUNTER — Encounter: Payer: Self-pay | Admitting: Internal Medicine

## 2017-06-08 ENCOUNTER — Ambulatory Visit (INDEPENDENT_AMBULATORY_CARE_PROVIDER_SITE_OTHER): Payer: MEDICARE | Admitting: Internal Medicine

## 2017-06-08 VITALS — BP 126/62 | HR 73 | Ht 65.0 in | Wt 160.0 lb

## 2017-06-08 DIAGNOSIS — I471 Supraventricular tachycardia: Secondary | ICD-10-CM | POA: Diagnosis not present

## 2017-06-08 DIAGNOSIS — Z950 Presence of cardiac pacemaker: Secondary | ICD-10-CM | POA: Diagnosis not present

## 2017-06-08 DIAGNOSIS — I495 Sick sinus syndrome: Secondary | ICD-10-CM | POA: Diagnosis not present

## 2017-06-08 DIAGNOSIS — R0602 Shortness of breath: Secondary | ICD-10-CM | POA: Diagnosis not present

## 2017-06-08 DIAGNOSIS — I10 Essential (primary) hypertension: Secondary | ICD-10-CM

## 2017-06-08 DIAGNOSIS — I6521 Occlusion and stenosis of right carotid artery: Secondary | ICD-10-CM

## 2017-06-08 MED ORDER — FUROSEMIDE 40 MG PO TABS: 40.0000 mg | ORAL_TABLET | Freq: Every day | ORAL | 0 refills | Status: DC

## 2017-06-08 NOTE — Progress Notes (Signed)
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  Since last being seen in our clinic, the patient reports doing reasonably well.  She has been advised to see [place] by [facility] due to concerns for CHF.  She has SOB with moderate activity.  This seems stable.  She also has chronic edema.  Today, her cheeks are quite red.  She thinks that this started overnight.  She also has pain in her L ear and neck.  Hearing is chronically poor.  Today, she denies symptoms of palpitations, chest pain, dizziness, presyncope, or syncope.  The patient is otherwise without complaint today.   Past Medical History:  Diagnosis Date  . Anemia, unspecified   . Anxiety state, unspecified   . Aortic valve disorders   . Bradycardia    s/p PPM  . Bursitis   . Complication of anesthesia    has had difficulty awakening in past; pt states did well with the anesthesia given during her carotid surgery   . Diabetes mellitus without complication ([place])   . Diverticulosis of colon (without mention of hemorrhage)   . Herpes zoster with other nervous system complications(053.19)   . Hyperlipidemia   . Hypertension   . Hypothyroidism   . Irritable bowel syndrome   . Menopause   . Osteoarthrosis, unspecified whether generalized or localized, unspecified site   . Peripheral vascular disease ([place])   . TIA (transient ischemic attack)   . Unspecified hearing loss   . Varicose veins    Past Surgical History:  Procedure Laterality Date  . APPENDECTOMY    . CATARACT EXTRACTION, BILATERAL  end of June 2013  . CHOLECYSTECTOMY    . DILATION AND CURETTAGE OF UTERUS    . ENDARTERECTOMY Right 01/20/2014   Procedure: RIGHT CAROTID ARTERY ENDARTERECTOMY WITH DACRON PATCH ANGIOPLASTY;  Surgeon: Mal Misty, MD;  Location: [place];  Service: Vascular;  Laterality: Right;  . PACEMAKER INSERTION     MDT dual chamber PPM implanted by Dr  [name] for sinus node dysfunction   . TONSILLECTOMY    . TOTAL HIP ARTHROPLASTY Left 03/23/2015   Procedure: TOTAL HIP ARTHROPLASTY ANTERIOR APPROACH;  Surgeon: Gaynelle Arabian, MD;  Location: [facility];  Service: Orthopedics;  Laterality: Left;  [name] VAGINAL HYSTERECTOMY      ROS- all systems are reviewed and negative except as per HPI above  Current Outpatient Medications  Medication Sig Dispense Refill  . ALPHA LIPOIC ACID PO Take 1 tablet by mouth daily.    [name] aspirin 325 MG tablet Take 325 mg by mouth daily.    [name] CRANBERRY PO Take 1 tablet by mouth daily with supper.    . Cyanocobalamin (VITAMIN B-12 PO) Take 1 tablet by mouth at bedtime.    Kayla Davis Calcium (STOOL SOFTENER PO) Take 2 capsules by mouth at bedtime.    [name] EVENING PRIMROSE OIL PO Take 1 capsule by mouth daily.    . fexofenadine (ALLEGRA) 30 MG tablet Take 30 mg by mouth 2 (two) times daily.    . hydrochlorothiazide (MICROZIDE) 12.5 MG capsule Take 1 capsule (12.5 mg total) by mouth daily. 90 capsule 3  . ipratropium (ATROVENT) 0.06 % nasal spray Place 2 sprays into both nostrils 3 (three) times daily. 45 mL 1  . Lancets (ONETOUCH ULTRASOFT) lancets Use to check blood sugars once a day Dx E11.9 100 each 3  .  levothyroxine (SYNTHROID, LEVOTHROID) 100 MCG tablet Take 1 tablet (100 mcg total) by mouth daily before breakfast. 90 tablet 1  . losartan (COZAAR) 25 MG tablet TAKE 1 TABLET TWICE A DAY 180 tablet 3  . LYRICA 75 MG capsule TAKE 1 CAPSULE TWICE DAILY 180 capsule 3  . Magnesium 250 MG TABS Take 1 tablet daily by mouth.     . metoprolol tartrate (LOPRESSOR) 25 MG tablet Take 1 tablet (25 mg total) by mouth 2 (two) times daily. 180 tablet 1  . Multiple Vitamins-Minerals (EYE VITAMINS) CAPS Take 1 capsule by mouth daily.    [name] (SYSTANE OP) Apply to eye daily.    . polyethylene glycol (MIRALAX) packet Mix 6 packets in one 32 oz bottle of gatorade, powerade, or other juice. Drink this bottle over the  course of a day. If you have not had multiple bowel movements, you can start taking this three times a day, then decrease to once a day once you have liquid bowel movements. (Patient taking differently: Take 17 g by mouth once. ) 30 each 0  . simvastatin (ZOCOR) 40 MG tablet Take 1 tablet (40 mg total) by mouth daily at 6 PM. 90 tablet 3   No current facility-administered medications for this visit.     Physical Exam: Vitals:   06/08/17 1428  BP: 126/62  Pulse: 73  SpO2: 96%  Weight: 160 lb (72.6 kg)  Height: 5\' 5"  (1.651 m)     GEN- The patient is elderly appearing, alert and oriented x 3 today.   Head- normocephalic, atraumatic Eyes-  Sclera clear, conjunctiva pink Ears- hearing is poor, + bilateral hearing aids,  Cerumen impaction limits exam, though there is no redness, drainage, or external canal tenderness Oropharynx- clear Neck supple, no JVD, no LAD Lungs- Clear to ausculation bilaterally with few basilar rales, normal work of breathing Chest- pacemaker pocket is well healed Heart- Regular rate and rhythm, no murmurs, rubs or gallops, PMI not laterally displaced GI- soft, NT, ND, + BS Extremities- no clubbing, cyanosis, trace edema  Pacemaker interrogation- reviewed in detail today,  See PACEART report  ekg tracing ordered today is personally reviewed and shows atrial paced rhythm, first degree AV block, IVCD  Assessment and Plan:  1. Symptomatic sinus bradycardia  Normal pacemaker function See Pace Art report No changes today  2. SOB Chronic Likely due to acute on chronic diastolic dysfunction 2 gram sodium diet Lasix 40mg  daily x 3 days Return to see cardiology APP in 2 weeks  3. HTN Stable No change required today  4. Ear pain, red cheeks No obvious indication for antibiotics Encouraged to follow-up with PCP Supportive care advised  5. SVT Stable No change required today   Thompson Grayer MD, [facility] 06/08/2017 2:35 PM

## 2017-06-08 NOTE — Patient Instructions (Addendum)
Medication Instructions:  Your physician has recommended you make the following change in your medication:  1.  Start taking furosemide 40 mg.  Take one tablet daily for 3 days.  Labwork: None ordered.  Testing/Procedures: None ordered.  Follow-Up: Follow up with Truitt Merle or Richardson Dopp in 2 weeks.  Your physician wants you to follow-up in: one year with Dr. [name].  You will receive a reminder letter in the mail two months in advance. If you don't receive a letter, please call our office to schedule the follow-up appointment.  Remote monitoring is used to monitor your Pacemaker from home. This monitoring reduces the number of office visits required to check your device to one time per year. It allows [place] to keep an eye on the functioning of your device to ensure it is working properly. You are scheduled for a device check from home on 07/16/2017. You may send your transmission at any time that day. If you have a wireless device, the transmission will be sent automatically. After your physician reviews your transmission, you will receive a postcard with your next transmission date.  Any Other Special Instructions Will Be Listed Below (If Applicable).  Limit your sodium intake to 2 Grams daily.  See DASH eating plan below.  If you need a refill on your cardiac medications before your next appointment, please call your pharmacy.   DASH Eating Plan DASH stands for "Dietary Approaches to Stop Hypertension." The DASH eating plan is a healthy eating plan that has been shown to reduce high blood pressure (hypertension). It may also reduce your risk for type 2 diabetes, heart disease, and stroke. The DASH eating plan may also help with weight loss. What are tips for following this plan? General guidelines  Avoid eating more than 2,300 mg (milligrams) of salt (sodium) a day. If you have hypertension, you may need to reduce your sodium intake to 1,500 mg a day.  Limit alcohol intake to no  more than 1 drink a day for nonpregnant women and 2 drinks a day for men. One drink equals 12 oz of beer, 5 oz of wine, or 1 oz of hard liquor.  Work with your health care provider to maintain a healthy body weight or to lose weight. Ask what an ideal weight is for you.  Get at least 30 minutes of exercise that causes your heart to beat faster (aerobic exercise) most days of the week. Activities may include walking, swimming, or biking.  Work with your health care provider or diet and nutrition specialist (dietitian) to adjust your eating plan to your individual calorie needs. Reading food labels  Check food labels for the amount of sodium per serving. Choose foods with less than 5 percent of the Daily Value of sodium. Generally, foods with less than 300 mg of sodium per serving fit into this eating plan.  To find whole grains, look for the word "whole" as the first word in the ingredient list. Shopping  Buy products labeled as "low-sodium" or "no salt added."  Buy fresh foods. Avoid canned foods and premade or frozen meals. Cooking  Avoid adding salt when cooking. Use salt-free seasonings or herbs instead of table salt or sea salt. Check with your health care provider or pharmacist before using salt substitutes.  Do not fry foods. Cook foods using healthy methods such as baking, boiling, grilling, and broiling instead.  Cook with heart-healthy oils, such as olive, canola, soybean, or sunflower oil. Meal planning   Eat a balanced  diet that includes: ? 5 or more servings of fruits and vegetables each day. At each meal, try to fill half of your plate with fruits and vegetables. ? Up to 6-8 servings of whole grains each day. ? Less than 6 oz of lean meat, poultry, or fish each day. A 3-oz serving of meat is about the same size as a deck of cards. One egg equals 1 oz. ? 2 servings of low-fat dairy each day. ? A serving of nuts, seeds, or beans 5 times each week. ? Heart-healthy fats.  Healthy fats called Omega-3 fatty acids are found in foods such as flaxseeds and coldwater fish, like sardines, salmon, and mackerel.  Limit how much you eat of the following: ? Canned or prepackaged foods. ? Food that is high in trans fat, such as fried foods. ? Food that is high in saturated fat, such as fatty meat. ? Sweets, desserts, sugary drinks, and other foods with added sugar. ? Full-fat dairy products.  Do not salt foods before eating.  Try to eat at least 2 vegetarian meals each week.  Eat more home-cooked food and less restaurant, buffet, and fast food.  When eating at a restaurant, ask that your food be prepared with less salt or no salt, if possible. What foods are recommended? The items listed may not be a complete list. Talk with your dietitian about what dietary choices are best for you. Grains Whole-grain or whole-wheat bread. Whole-grain or whole-wheat pasta. Brown rice. Modena Morrow. Bulgur. Whole-grain and low-sodium cereals. Pita bread. Low-fat, low-sodium crackers. Whole-wheat flour tortillas. Vegetables Fresh or frozen vegetables (raw, steamed, roasted, or grilled). Low-sodium or reduced-sodium tomato and vegetable juice. Low-sodium or reduced-sodium tomato sauce and tomato paste. Low-sodium or reduced-sodium canned vegetables. Fruits All fresh, dried, or frozen fruit. Canned fruit in natural juice (without added sugar). Meat and other protein foods Skinless chicken or [place]. Ground chicken or [place]. Pork with fat trimmed off. Fish and seafood. Egg whites. Dried beans, peas, or lentils. Unsalted nuts, nut butters, and seeds. Unsalted canned beans. Lean cuts of beef with fat trimmed off. Low-sodium, lean deli meat. Dairy Low-fat (1%) or fat-free (skim) milk. Fat-free, low-fat, or reduced-fat cheeses. Nonfat, low-sodium ricotta or cottage cheese. Low-fat or nonfat yogurt. Low-fat, low-sodium cheese. Fats and oils Soft margarine without trans fats. Vegetable  oil. Low-fat, reduced-fat, or light mayonnaise and salad dressings (reduced-sodium). Canola, safflower, olive, soybean, and sunflower oils. Avocado. Seasoning and other foods Herbs. Spices. Seasoning mixes without salt. Unsalted popcorn and pretzels. Fat-free sweets. What foods are not recommended? The items listed may not be a complete list. Talk with your dietitian about what dietary choices are best for you. Grains Baked goods made with fat, such as croissants, muffins, or some breads. Dry pasta or rice meal packs. Vegetables Creamed or fried vegetables. Vegetables in a cheese sauce. Regular canned vegetables (not low-sodium or reduced-sodium). Regular canned tomato sauce and paste (not low-sodium or reduced-sodium). Regular tomato and vegetable juice (not low-sodium or reduced-sodium). Angie Fava. Olives. Fruits Canned fruit in a light or heavy syrup. Fried fruit. Fruit in cream or butter sauce. Meat and other protein foods Fatty cuts of meat. Ribs. Fried meat. Berniece Salines. Sausage. Bologna and other processed lunch meats. Salami. Fatback. Hotdogs. Bratwurst. Salted nuts and seeds. Canned beans with added salt. Canned or smoked fish. Whole eggs or egg yolks. Chicken or [place] with skin. Dairy Whole or 2% milk, cream, and half-and-half. Whole or full-fat cream cheese. Whole-fat or sweetened yogurt. Full-fat cheese. Nondairy  creamers. Whipped toppings. Processed cheese and cheese spreads. Fats and oils Butter. Stick margarine. Lard. Shortening. Ghee. Bacon fat. Tropical oils, such as coconut, palm kernel, or palm oil. Seasoning and other foods Salted popcorn and pretzels. Onion salt, garlic salt, seasoned salt, table salt, and sea salt. Worcestershire sauce. Tartar sauce. Barbecue sauce. Teriyaki sauce. Soy sauce, including reduced-sodium. Steak sauce. Canned and packaged gravies. Fish sauce. Oyster sauce. Cocktail sauce. Horseradish that you find on the shelf. Ketchup. Mustard. Meat flavorings and  tenderizers. Bouillon cubes. Hot sauce and Tabasco sauce. Premade or packaged marinades. Premade or packaged taco seasonings. Relishes. Regular salad dressings. Where to find more information:  National Heart, Lung, and [place]: https://wilson-[place].com/  American Heart Association: www.heart.org Summary  The DASH eating plan is a healthy eating plan that has been shown to reduce high blood pressure (hypertension). It may also reduce your risk for type 2 diabetes, heart disease, and stroke.  With the DASH eating plan, you should limit salt (sodium) intake to 2,300 mg a day. If you have hypertension, you may need to reduce your sodium intake to 1,500 mg a day.  When on the DASH eating plan, aim to eat more fresh fruits and vegetables, whole grains, lean proteins, low-fat dairy, and heart-healthy fats.  Work with your health care provider or diet and nutrition specialist (dietitian) to adjust your eating plan to your individual calorie needs. This information is not intended to replace advice given to you by your health care provider. Make sure you discuss any questions you have with your health care provider. Document Released: 03/20/2011 Document Revised: 03/24/2016 Document Reviewed: 03/24/2016 Elsevier Interactive Patient Education  [employer].

## 2017-06-09 DIAGNOSIS — B0229 Other postherpetic nervous system involvement: Secondary | ICD-10-CM | POA: Diagnosis not present

## 2017-06-09 DIAGNOSIS — M1990 Unspecified osteoarthritis, unspecified site: Secondary | ICD-10-CM | POA: Diagnosis not present

## 2017-06-09 DIAGNOSIS — R42 Dizziness and giddiness: Secondary | ICD-10-CM | POA: Diagnosis not present

## 2017-06-09 DIAGNOSIS — E1151 Type 2 diabetes mellitus with diabetic peripheral angiopathy without gangrene: Secondary | ICD-10-CM | POA: Diagnosis not present

## 2017-06-09 DIAGNOSIS — M545 Low back pain: Secondary | ICD-10-CM | POA: Diagnosis not present

## 2017-06-09 DIAGNOSIS — I10 Essential (primary) hypertension: Secondary | ICD-10-CM | POA: Diagnosis not present

## 2017-06-10 ENCOUNTER — Encounter: Payer: Self-pay | Admitting: Nurse Practitioner

## 2017-06-11 ENCOUNTER — Telehealth: Payer: Self-pay | Admitting: Internal Medicine

## 2017-06-11 DIAGNOSIS — I10 Essential (primary) hypertension: Secondary | ICD-10-CM | POA: Diagnosis not present

## 2017-06-11 DIAGNOSIS — M545 Low back pain: Secondary | ICD-10-CM | POA: Diagnosis not present

## 2017-06-11 DIAGNOSIS — B0229 Other postherpetic nervous system involvement: Secondary | ICD-10-CM | POA: Diagnosis not present

## 2017-06-11 DIAGNOSIS — M1990 Unspecified osteoarthritis, unspecified site: Secondary | ICD-10-CM | POA: Diagnosis not present

## 2017-06-11 DIAGNOSIS — R42 Dizziness and giddiness: Secondary | ICD-10-CM | POA: Diagnosis not present

## 2017-06-11 DIAGNOSIS — E1151 Type 2 diabetes mellitus with diabetic peripheral angiopathy without gangrene: Secondary | ICD-10-CM | POA: Diagnosis not present

## 2017-06-11 NOTE — Telephone Encounter (Signed)
Copied from [place] [id number]. Topic: Quick Communication - See Telephone Encounter >> Jun 11, 2017  2:58 PM Ivar Drape wrote: CRM for notification. See Telephone encounter for:  06/11/17. Patient was last seen by provider on 03/13/17.  Kayla Athens w/[facility] [phone] or [phone] wants an order to continue the patient's physical therapy.  She will be finishing the present order next week. Kayla Davis would like 6 more weeks at twice a week of physical therapy with the patient.

## 2017-06-13 NOTE — Telephone Encounter (Signed)
Ok thx.

## 2017-06-15 DIAGNOSIS — M1990 Unspecified osteoarthritis, unspecified site: Secondary | ICD-10-CM | POA: Diagnosis not present

## 2017-06-15 DIAGNOSIS — M545 Low back pain: Secondary | ICD-10-CM | POA: Diagnosis not present

## 2017-06-15 DIAGNOSIS — R42 Dizziness and giddiness: Secondary | ICD-10-CM | POA: Diagnosis not present

## 2017-06-15 DIAGNOSIS — I10 Essential (primary) hypertension: Secondary | ICD-10-CM | POA: Diagnosis not present

## 2017-06-15 DIAGNOSIS — B0229 Other postherpetic nervous system involvement: Secondary | ICD-10-CM | POA: Diagnosis not present

## 2017-06-15 DIAGNOSIS — E1151 Type 2 diabetes mellitus with diabetic peripheral angiopathy without gangrene: Secondary | ICD-10-CM | POA: Diagnosis not present

## 2017-06-15 NOTE — Telephone Encounter (Signed)
Called [place] no answer LMOM w/MD response.Marland KitchenJohny Chess

## 2017-06-17 ENCOUNTER — Encounter: Payer: Self-pay | Admitting: Nurse Practitioner

## 2017-06-17 ENCOUNTER — Ambulatory Visit (INDEPENDENT_AMBULATORY_CARE_PROVIDER_SITE_OTHER): Payer: MEDICARE | Admitting: Nurse Practitioner

## 2017-06-17 ENCOUNTER — Telehealth: Payer: Self-pay | Admitting: *Deleted

## 2017-06-17 VITALS — BP 122/58 | HR 60 | Ht 65.0 in | Wt 158.0 lb

## 2017-06-17 DIAGNOSIS — R0602 Shortness of breath: Secondary | ICD-10-CM

## 2017-06-17 DIAGNOSIS — I6521 Occlusion and stenosis of right carotid artery: Secondary | ICD-10-CM

## 2017-06-17 MED ORDER — FUROSEMIDE 40 MG PO TABS: 20.0000 mg | ORAL_TABLET | Freq: Every day | ORAL | 0 refills | Status: DC | PRN

## 2017-06-17 NOTE — Patient Instructions (Addendum)
We will be checking the following labs today - BMET   Medication Instructions:    Continue with your current medicines. BUT  Only take the Lasix for swelling/weight gain - only take 1/2 a dose (20mg )    Testing/Procedures To Be Arranged:  N/A  Follow-Up:   See me in about 4 months    Other Special Instructions:   N/A    If you need a refill on your cardiac medications before your next appointment, please call your pharmacy.   Call the [place] office at [phone] if you have any questions, problems or concerns.

## 2017-06-17 NOTE — Telephone Encounter (Signed)
Faxing to Dr. Judeen Hammans office @ [phone],  Phone # is [phone]. pt's form from [facility] for back PT, 8 more sessions.

## 2017-06-17 NOTE — Progress Notes (Addendum)
CARDIOLOGY OFFICE NOTE  Date:  06/17/2017    [name] Date of Birth: 05-18-1922 Medical Record #[id number]  PCP:  [name], MD  Cardiologist:  [name]  Chief Complaint  Patient presents with  . Congestive Heart Failure    Follow up visit - seen for Dr. [name]    History of Present Illness: Kayla Davis is a 84 y.o. female who presents today for a follow up visit. Seen for Dr. [name].  She is a 84 year old female. Has PPM in place due to bradycardia. Other issues DM, HTN, HLD, TIA and hypothyroidism.   Seen here about 10 days ago - some concern for volume overload. Given 3 days of Lasix.   Comes in today. Here with her daughter. Says she is doing better. The swelling in her feet is gone. Her breathing is ok. No chest pain. Her left ear is feeling better as well. She did have several day history of diarrhea - this has just now resolved.  Using her walker - no falls. She remains on full dose aspirin due to prior possible stroke/TIA. Apparently was to have been on Coumadin but daughter notes there was some issue with that and she has been on full dose aspirin since. She does not wish to stop. She may get too much salt. She has a PT form that needs to recertified. Would defer this to her PCP.   Past Medical History:  Diagnosis Date  . Anemia, unspecified   . Anxiety state, unspecified   . Aortic valve disorders   . Bradycardia    s/p PPM  . Bursitis   . Complication of anesthesia    has had difficulty awakening in past; pt states did well with the anesthesia given during her carotid surgery   . Diabetes mellitus without complication ([name])   . Diverticulosis of colon (without mention of hemorrhage)   . Herpes zoster with other nervous system complications(053.19)   . Hyperlipidemia   . Hypertension   . Hypothyroidism   . Irritable bowel syndrome   . Menopause   . Osteoarthrosis, unspecified whether generalized or localized, unspecified site   .  Peripheral vascular disease ([place])   . TIA (transient ischemic attack)   . Unspecified hearing loss   . Varicose veins     Past Surgical History:  Procedure Laterality Date  . APPENDECTOMY    . CATARACT EXTRACTION, BILATERAL  end of June 2013  . CHOLECYSTECTOMY    . DILATION AND CURETTAGE OF UTERUS    . ENDARTERECTOMY Right 01/20/2014   Procedure: RIGHT CAROTID ARTERY ENDARTERECTOMY WITH DACRON PATCH ANGIOPLASTY;  Surgeon: Mal Misty, MD;  Location: [facility];  Service: Vascular;  Laterality: Right;  . PACEMAKER INSERTION     MDT dual chamber PPM implanted by Dr [name] for sinus node dysfunction   . TONSILLECTOMY    . TOTAL HIP ARTHROPLASTY Left 03/23/2015   Procedure: TOTAL HIP ARTHROPLASTY ANTERIOR APPROACH;  Surgeon: Gaynelle Arabian, MD;  Location: [facility];  Service: Orthopedics;  Laterality: Left;  [name] VAGINAL HYSTERECTOMY       Medications: Current Meds  Medication Sig  . ALPHA LIPOIC ACID PO Take 1 tablet by mouth daily.  [name] aspirin 325 MG tablet Take 325 mg by mouth daily.  [name] CRANBERRY PO Take 1 tablet by mouth daily with supper.  . Cyanocobalamin (VITAMIN B-12 PO) Take 1 tablet by mouth at bedtime.  [name] Calcium (STOOL SOFTENER PO) Take 2 capsules by  mouth at bedtime.  [name] EVENING PRIMROSE OIL PO Take 1 capsule by mouth daily.  . fexofenadine (ALLEGRA) 30 MG tablet Take 30 mg by mouth 2 (two) times daily.  . hydrochlorothiazide (MICROZIDE) 12.5 MG capsule Take 1 capsule (12.5 mg total) by mouth daily.  [name] ipratropium (ATROVENT) 0.06 % nasal spray Place 2 sprays into both nostrils 3 (three) times daily.  . Lancets (ONETOUCH ULTRASOFT) lancets Use to check blood sugars once a day Dx E11.9  . levothyroxine (SYNTHROID, LEVOTHROID) 100 MCG tablet Take 1 tablet (100 mcg total) by mouth daily before breakfast.  . losartan (COZAAR) 25 MG tablet TAKE 1 TABLET TWICE A DAY  . LYRICA 75 MG capsule TAKE 1 CAPSULE TWICE DAILY  . Magnesium 250 MG TABS Take 1 tablet daily by mouth.   .  metoprolol tartrate (LOPRESSOR) 25 MG tablet Take 1 tablet (25 mg total) by mouth 2 (two) times daily.  . Multiple Vitamins-Minerals (EYE VITAMINS) CAPS Take 1 capsule by mouth daily.  [name] (SYSTANE OP) Apply to eye daily.  . polyethylene glycol (MIRALAX) packet Mix 6 packets in one 32 oz bottle of gatorade, powerade, or other juice. Drink this bottle over the course of a day. If you have not had multiple bowel movements, you can start taking this three times a day, then decrease to once a day once you have liquid bowel movements. (Patient taking differently: Take 17 g by mouth once. )  . simvastatin (ZOCOR) 40 MG tablet Take 1 tablet (40 mg total) by mouth daily at 6 PM.     Allergies: Allergies  Allergen Reactions  . Penicillins Swelling and Other (See Comments)    BODY SWELLS & PT STATES THAT SHE ALMOST DIED Has patient had a PCN reaction causing immediate rash, facial/tongue/throat swelling, SOB or lightheadedness with hypotension: Yes Has patient had a PCN reaction causing severe rash involving mucus membranes or skin necrosis: known Has patient had a PCN reaction that required hospitalization Yes Has patient had a PCN reaction occurring within the last 10 years: No If all of the above answers are "NO", then may proceed with Cephalosporin u  . Alprazolam Swelling    EYES SWELL  . Bactrim [Sulfamethoxazole-Trimethoprim]     Unable to remember the side effects  . Ceclor [Cefaclor]     Unsure of the side effects  . Cephalexin     Unsure of the side effects  . Ciprofloxacin Itching and Swelling    Pt states that she tolerated po cipro well, but not the higher dose given IV  . Clindamycin/Lincomycin Nausea Only    Upset stomach  . Hydrocodone     Unsure of reaction to med  . Noroxin [Norfloxacin]     Unsure of side effects  . Sulfonamide Derivatives Itching  . Tetracyclines & Related     Unsure of the reaction to this medication  . Tramadol Other (See  Comments)    Did not like how she "felt"    Social History: The patient  reports that  has never smoked. she has never used smokeless tobacco. She reports that she does not drink alcohol or use drugs.   Family History: The patient's family history includes Cancer in her paternal grandmother; Diabetes in her father; Heart disease in her father and mother.   Review of Systems: Please see the history of present illness.   Otherwise, the review of systems is positive for none.   All other systems are reviewed and negative.  Physical Exam: VS:  BP (!) 122/58 (BP Location: Left Arm, Patient Position: Sitting, Cuff Size: Normal)   Pulse 60   Ht 5\' 5"  (1.651 m)   Wt 158 lb (71.7 kg)   LMP  (LMP Unknown)   BMI 26.29 kg/m  .  BMI Body mass index is 26.29 kg/m.  Wt Readings from Last 3 Encounters:  06/17/17 158 lb (71.7 kg)  06/08/17 160 lb (72.6 kg)  06/02/17 160 lb 4.8 oz (72.7 kg)    General: Pleasant. Elderly. Alert and in no acute distress.   HEENT: Normal.  Neck: Supple, no JVD, carotid bruits, or masses noted.  Cardiac: Regular rate and rhythm. No murmurs, rubs, or gallops. No edema.  Respiratory:  Lungs are fairly clear to auscultation bilaterally with normal work of breathing. There are some crisp crackles in the bases.  GI: Soft and nontender.  MS: No deformity or atrophy. Gait and ROM intact. Using a walker.  Skin: Warm and dry. Color is normal.  Neuro:  Strength and sensation are intact and no gross focal deficits noted.  Psych: Alert, appropriate and with normal affect.   LABORATORY DATA:  EKG:  EKG is not ordered today.  Lab Results  Component Value Date   WBC 7.4 03/14/2017   HGB 12.3 03/14/2017   HCT 36.8 03/14/2017   PLT 260 03/14/2017   GLUCOSE 125 (H) 03/14/2017   CHOL 162 12/24/2016   TRIG 97.0 12/24/2016   HDL 80.70 12/24/2016   LDLDIRECT 143.0 10/07/2007   LDLCALC 62 12/24/2016   ALT 19 03/14/2017   AST 24 03/14/2017   NA 129 (L) 03/14/2017   K  4.0 03/14/2017   CL 91 (L) 03/14/2017   CREATININE 0.86 03/14/2017   BUN 19 03/14/2017   CO2 28 03/14/2017   TSH 6.38 (H) 12/24/2016   INR 1.06 07/03/2016   HGBA1C 6.6 (H) 12/24/2016     BNP (last 3 results) No results for input(s): BNP in the last 8760 hours.  ProBNP (last 3 results) No results for input(s): PROBNP in the last 8760 hours.   Other Studies Reviewed Today:   Assessment/Plan:  1. Dyspnea - felt to have probable diastolic dysfunction - was treated with 3 days of Lasix - swelling has improved. She has scarring noted on prior CXR. Would favor using just prn Lasix for swelling. BMET today.   2. Underlying PPM for symptomatic bradycardia  3. HTN - BP ok on current regimen.   4. Advanced age - she is needing recert for her PT - would defer to her PCP  Current medicines are reviewed with the patient today.  The patient does not have concerns regarding medicines other than what has been noted above.  The following changes have been made:  See above.  Labs/ tests ordered today include:    Orders Placed This Encounter  Procedures  . Basic metabolic panel     Disposition:   FU with me in 4 months and see Dr. [name] as planned.   Patient is agreeable to this plan and will call if any problems develop in the interim.   SignedTruitt Merle, NP  06/17/2017 3:17 PM  [facility] [address] [place], [place]  [address] Phone: [phone] Fax: [phone]

## 2017-06-18 DIAGNOSIS — R42 Dizziness and giddiness: Secondary | ICD-10-CM | POA: Diagnosis not present

## 2017-06-18 DIAGNOSIS — B0229 Other postherpetic nervous system involvement: Secondary | ICD-10-CM | POA: Diagnosis not present

## 2017-06-18 DIAGNOSIS — M545 Low back pain: Secondary | ICD-10-CM | POA: Diagnosis not present

## 2017-06-18 DIAGNOSIS — I10 Essential (primary) hypertension: Secondary | ICD-10-CM | POA: Diagnosis not present

## 2017-06-18 DIAGNOSIS — M1990 Unspecified osteoarthritis, unspecified site: Secondary | ICD-10-CM | POA: Diagnosis not present

## 2017-06-18 DIAGNOSIS — E1151 Type 2 diabetes mellitus with diabetic peripheral angiopathy without gangrene: Secondary | ICD-10-CM | POA: Diagnosis not present

## 2017-06-18 LAB — BASIC METABOLIC PANEL
BUN/Creatinine Ratio: 24 (ref 12–28)
BUN: 21 mg/dL (ref 10–36)
CO2: 26 mmol/L (ref 20–29)
Calcium: 9.1 mg/dL (ref 8.7–10.3)
Chloride: 93 mmol/L — ABNORMAL LOW (ref 96–106)
Creatinine, Ser: 0.87 mg/dL (ref 0.57–1.00)
GFR calc Af Amer: 67 mL/min/{1.73_m2} (ref >59)
GFR calc non Af Amer: 58 mL/min/{1.73_m2} — ABNORMAL LOW (ref >59)
Glucose: 151 mg/dL — ABNORMAL HIGH (ref 65–99)
Potassium: 4.5 mmol/L (ref 3.5–5.2)
Sodium: 133 mmol/L — ABNORMAL LOW (ref 134–144)

## 2017-06-23 DIAGNOSIS — M1990 Unspecified osteoarthritis, unspecified site: Secondary | ICD-10-CM | POA: Diagnosis not present

## 2017-06-23 DIAGNOSIS — B0229 Other postherpetic nervous system involvement: Secondary | ICD-10-CM | POA: Diagnosis not present

## 2017-06-23 DIAGNOSIS — R42 Dizziness and giddiness: Secondary | ICD-10-CM | POA: Diagnosis not present

## 2017-06-23 DIAGNOSIS — I10 Essential (primary) hypertension: Secondary | ICD-10-CM | POA: Diagnosis not present

## 2017-06-23 DIAGNOSIS — E1151 Type 2 diabetes mellitus with diabetic peripheral angiopathy without gangrene: Secondary | ICD-10-CM | POA: Diagnosis not present

## 2017-06-23 DIAGNOSIS — M545 Low back pain: Secondary | ICD-10-CM | POA: Diagnosis not present

## 2017-06-25 ENCOUNTER — Other Ambulatory Visit: Payer: Self-pay

## 2017-06-25 DIAGNOSIS — R42 Dizziness and giddiness: Secondary | ICD-10-CM | POA: Diagnosis not present

## 2017-06-25 DIAGNOSIS — E1151 Type 2 diabetes mellitus with diabetic peripheral angiopathy without gangrene: Secondary | ICD-10-CM | POA: Diagnosis not present

## 2017-06-25 DIAGNOSIS — I10 Essential (primary) hypertension: Secondary | ICD-10-CM | POA: Diagnosis not present

## 2017-06-25 DIAGNOSIS — M1990 Unspecified osteoarthritis, unspecified site: Secondary | ICD-10-CM | POA: Diagnosis not present

## 2017-06-25 DIAGNOSIS — B0229 Other postherpetic nervous system involvement: Secondary | ICD-10-CM | POA: Diagnosis not present

## 2017-06-25 DIAGNOSIS — M545 Low back pain: Secondary | ICD-10-CM | POA: Diagnosis not present

## 2017-06-25 MED ORDER — IPRATROPIUM BROMIDE 0.06 % NA SOLN: 2.0000 | Freq: Three times a day (TID) | NASAL | 1 refills | Status: DC

## 2017-06-27 DIAGNOSIS — M1990 Unspecified osteoarthritis, unspecified site: Secondary | ICD-10-CM | POA: Diagnosis not present

## 2017-06-27 DIAGNOSIS — R42 Dizziness and giddiness: Secondary | ICD-10-CM | POA: Diagnosis not present

## 2017-06-27 DIAGNOSIS — I10 Essential (primary) hypertension: Secondary | ICD-10-CM | POA: Diagnosis not present

## 2017-06-27 DIAGNOSIS — M545 Low back pain: Secondary | ICD-10-CM | POA: Diagnosis not present

## 2017-06-27 DIAGNOSIS — B0229 Other postherpetic nervous system involvement: Secondary | ICD-10-CM | POA: Diagnosis not present

## 2017-06-27 DIAGNOSIS — E1151 Type 2 diabetes mellitus with diabetic peripheral angiopathy without gangrene: Secondary | ICD-10-CM | POA: Diagnosis not present

## 2017-06-29 ENCOUNTER — Telehealth: Payer: Self-pay | Admitting: Internal Medicine

## 2017-06-29 DIAGNOSIS — I10 Essential (primary) hypertension: Secondary | ICD-10-CM | POA: Diagnosis not present

## 2017-06-29 DIAGNOSIS — R42 Dizziness and giddiness: Secondary | ICD-10-CM | POA: Diagnosis not present

## 2017-06-29 DIAGNOSIS — M545 Low back pain: Secondary | ICD-10-CM | POA: Diagnosis not present

## 2017-06-29 DIAGNOSIS — E1151 Type 2 diabetes mellitus with diabetic peripheral angiopathy without gangrene: Secondary | ICD-10-CM | POA: Diagnosis not present

## 2017-06-29 DIAGNOSIS — B0229 Other postherpetic nervous system involvement: Secondary | ICD-10-CM | POA: Diagnosis not present

## 2017-06-29 DIAGNOSIS — M1990 Unspecified osteoarthritis, unspecified site: Secondary | ICD-10-CM | POA: Diagnosis not present

## 2017-06-29 NOTE — Telephone Encounter (Signed)
Copied from [place]. Topic: Quick Communication - See Telephone Encounter >> Jun 29, 2017 11:39 AM Boyd Kerbs wrote: CRM for notification. See Telephone encounter for:   Kayla Davis with [facility] [phone],  Needs verbal for extend PT - she did re-certification on 1-62 and will be doing the PT 2 x 3 :  06/29/17.

## 2017-06-30 NOTE — Telephone Encounter (Signed)
Please advise. Thanks.  

## 2017-07-01 DIAGNOSIS — M545 Low back pain: Secondary | ICD-10-CM | POA: Diagnosis not present

## 2017-07-01 DIAGNOSIS — R42 Dizziness and giddiness: Secondary | ICD-10-CM | POA: Diagnosis not present

## 2017-07-01 DIAGNOSIS — M1990 Unspecified osteoarthritis, unspecified site: Secondary | ICD-10-CM | POA: Diagnosis not present

## 2017-07-01 DIAGNOSIS — I10 Essential (primary) hypertension: Secondary | ICD-10-CM | POA: Diagnosis not present

## 2017-07-01 DIAGNOSIS — E1151 Type 2 diabetes mellitus with diabetic peripheral angiopathy without gangrene: Secondary | ICD-10-CM | POA: Diagnosis not present

## 2017-07-01 DIAGNOSIS — B0229 Other postherpetic nervous system involvement: Secondary | ICD-10-CM | POA: Diagnosis not present

## 2017-07-01 NOTE — Telephone Encounter (Signed)
Ok Thx 

## 2017-07-01 NOTE — Telephone Encounter (Signed)
Called and left message today for Kayla Davis verbal okay to extend PT per Dr. Alain Marion.

## 2017-07-05 ENCOUNTER — Other Ambulatory Visit: Payer: Self-pay | Admitting: Internal Medicine

## 2017-07-06 DIAGNOSIS — B0229 Other postherpetic nervous system involvement: Secondary | ICD-10-CM | POA: Diagnosis not present

## 2017-07-06 DIAGNOSIS — M1990 Unspecified osteoarthritis, unspecified site: Secondary | ICD-10-CM | POA: Diagnosis not present

## 2017-07-06 DIAGNOSIS — I10 Essential (primary) hypertension: Secondary | ICD-10-CM | POA: Diagnosis not present

## 2017-07-06 DIAGNOSIS — M545 Low back pain: Secondary | ICD-10-CM | POA: Diagnosis not present

## 2017-07-06 DIAGNOSIS — E1151 Type 2 diabetes mellitus with diabetic peripheral angiopathy without gangrene: Secondary | ICD-10-CM | POA: Diagnosis not present

## 2017-07-06 DIAGNOSIS — R42 Dizziness and giddiness: Secondary | ICD-10-CM | POA: Diagnosis not present

## 2017-07-08 DIAGNOSIS — M545 Low back pain: Secondary | ICD-10-CM | POA: Diagnosis not present

## 2017-07-08 DIAGNOSIS — E1151 Type 2 diabetes mellitus with diabetic peripheral angiopathy without gangrene: Secondary | ICD-10-CM | POA: Diagnosis not present

## 2017-07-08 DIAGNOSIS — I10 Essential (primary) hypertension: Secondary | ICD-10-CM | POA: Diagnosis not present

## 2017-07-08 DIAGNOSIS — B0229 Other postherpetic nervous system involvement: Secondary | ICD-10-CM | POA: Diagnosis not present

## 2017-07-08 DIAGNOSIS — R42 Dizziness and giddiness: Secondary | ICD-10-CM | POA: Diagnosis not present

## 2017-07-08 DIAGNOSIS — M1990 Unspecified osteoarthritis, unspecified site: Secondary | ICD-10-CM | POA: Diagnosis not present

## 2017-07-13 DIAGNOSIS — I10 Essential (primary) hypertension: Secondary | ICD-10-CM | POA: Diagnosis not present

## 2017-07-13 DIAGNOSIS — R42 Dizziness and giddiness: Secondary | ICD-10-CM | POA: Diagnosis not present

## 2017-07-13 DIAGNOSIS — M1990 Unspecified osteoarthritis, unspecified site: Secondary | ICD-10-CM | POA: Diagnosis not present

## 2017-07-13 DIAGNOSIS — E1151 Type 2 diabetes mellitus with diabetic peripheral angiopathy without gangrene: Secondary | ICD-10-CM | POA: Diagnosis not present

## 2017-07-13 DIAGNOSIS — M545 Low back pain: Secondary | ICD-10-CM | POA: Diagnosis not present

## 2017-07-13 DIAGNOSIS — B0229 Other postherpetic nervous system involvement: Secondary | ICD-10-CM | POA: Diagnosis not present

## 2017-07-15 DIAGNOSIS — I359 Nonrheumatic aortic valve disorder, unspecified: Secondary | ICD-10-CM | POA: Diagnosis not present

## 2017-07-15 DIAGNOSIS — Z7982 Long term (current) use of aspirin: Secondary | ICD-10-CM

## 2017-07-15 DIAGNOSIS — M810 Age-related osteoporosis without current pathological fracture: Secondary | ICD-10-CM | POA: Diagnosis not present

## 2017-07-15 DIAGNOSIS — B0229 Other postherpetic nervous system involvement: Secondary | ICD-10-CM | POA: Diagnosis not present

## 2017-07-15 DIAGNOSIS — Z8673 Personal history of transient ischemic attack (TIA), and cerebral infarction without residual deficits: Secondary | ICD-10-CM

## 2017-07-15 DIAGNOSIS — H9190 Unspecified hearing loss, unspecified ear: Secondary | ICD-10-CM | POA: Diagnosis not present

## 2017-07-15 DIAGNOSIS — R001 Bradycardia, unspecified: Secondary | ICD-10-CM | POA: Diagnosis not present

## 2017-07-15 DIAGNOSIS — F419 Anxiety disorder, unspecified: Secondary | ICD-10-CM | POA: Diagnosis not present

## 2017-07-15 DIAGNOSIS — I10 Essential (primary) hypertension: Secondary | ICD-10-CM | POA: Diagnosis not present

## 2017-07-15 DIAGNOSIS — Z87440 Personal history of urinary (tract) infections: Secondary | ICD-10-CM | POA: Diagnosis not present

## 2017-07-15 DIAGNOSIS — Z950 Presence of cardiac pacemaker: Secondary | ICD-10-CM

## 2017-07-15 DIAGNOSIS — E1151 Type 2 diabetes mellitus with diabetic peripheral angiopathy without gangrene: Secondary | ICD-10-CM | POA: Diagnosis not present

## 2017-07-15 DIAGNOSIS — R42 Dizziness and giddiness: Secondary | ICD-10-CM | POA: Diagnosis not present

## 2017-07-15 DIAGNOSIS — I872 Venous insufficiency (chronic) (peripheral): Secondary | ICD-10-CM | POA: Diagnosis not present

## 2017-07-15 DIAGNOSIS — Z96642 Presence of left artificial hip joint: Secondary | ICD-10-CM

## 2017-07-15 DIAGNOSIS — M1990 Unspecified osteoarthritis, unspecified site: Secondary | ICD-10-CM | POA: Diagnosis not present

## 2017-07-16 ENCOUNTER — Ambulatory Visit (INDEPENDENT_AMBULATORY_CARE_PROVIDER_SITE_OTHER): Payer: MEDICARE | Admitting: *Deleted

## 2017-07-16 ENCOUNTER — Telehealth: Payer: Self-pay | Admitting: Cardiology

## 2017-07-16 DIAGNOSIS — M545 Low back pain: Secondary | ICD-10-CM | POA: Diagnosis not present

## 2017-07-16 DIAGNOSIS — M1990 Unspecified osteoarthritis, unspecified site: Secondary | ICD-10-CM | POA: Diagnosis not present

## 2017-07-16 DIAGNOSIS — I10 Essential (primary) hypertension: Secondary | ICD-10-CM | POA: Diagnosis not present

## 2017-07-16 DIAGNOSIS — E1151 Type 2 diabetes mellitus with diabetic peripheral angiopathy without gangrene: Secondary | ICD-10-CM | POA: Diagnosis not present

## 2017-07-16 DIAGNOSIS — R42 Dizziness and giddiness: Secondary | ICD-10-CM | POA: Diagnosis not present

## 2017-07-16 DIAGNOSIS — B0229 Other postherpetic nervous system involvement: Secondary | ICD-10-CM | POA: Diagnosis not present

## 2017-07-16 DIAGNOSIS — I495 Sick sinus syndrome: Secondary | ICD-10-CM

## 2017-07-16 NOTE — Telephone Encounter (Signed)
Spoke with pt and reminded pt of remote transmission that is due today. Pt verbalized understanding.   

## 2017-07-20 NOTE — Progress Notes (Signed)
Remote pacemaker transmission.   

## 2017-07-22 ENCOUNTER — Encounter: Payer: Self-pay | Admitting: Cardiology

## 2017-07-24 ENCOUNTER — Telehealth: Payer: Self-pay | Admitting: Internal Medicine

## 2017-07-24 NOTE — Telephone Encounter (Signed)
Please advise 

## 2017-07-24 NOTE — Telephone Encounter (Signed)
Pls sch OV w/any provider. We need to know what to treat Thx

## 2017-07-24 NOTE — Telephone Encounter (Signed)
Copied from [place]. Topic: Quick Communication - See Telephone Encounter >> Jul 24, 2017  3:50 PM Bea Graff, NT wrote: CRM for notification. See Telephone encounter for: 07/24/17. Pt would like to see if some ear drops can be called into her pharmacy for her ear ache? Advised pt she may need an appt first but she wanted to check with Dr. Alain Marion before making the appt. [id number] - [place], [place] - [address]. AT [address] [place] [phone] (Phone) [phone] (Fax)

## 2017-07-27 NOTE — Telephone Encounter (Signed)
See below.  Thank you

## 2017-07-28 NOTE — Telephone Encounter (Signed)
LVM for patient to call back to make an appt  °

## 2017-08-04 ENCOUNTER — Ambulatory Visit (INDEPENDENT_AMBULATORY_CARE_PROVIDER_SITE_OTHER): Payer: MEDICARE | Admitting: Internal Medicine

## 2017-08-04 ENCOUNTER — Encounter: Payer: Self-pay | Admitting: Internal Medicine

## 2017-08-04 DIAGNOSIS — H6123 Impacted cerumen, bilateral: Secondary | ICD-10-CM | POA: Diagnosis not present

## 2017-08-04 DIAGNOSIS — I6521 Occlusion and stenosis of right carotid artery: Secondary | ICD-10-CM

## 2017-08-04 DIAGNOSIS — Z1211 Encounter for screening for malignant neoplasm of colon: Secondary | ICD-10-CM | POA: Insufficient documentation

## 2017-08-04 LAB — CUP PACEART REMOTE DEVICE CHECK
Brady Statistic AP VS Percent: 92 %
Brady Statistic AS VP Percent: 0 %
Brady Statistic AS VS Percent: 7 %
Date Time Interrogation Session: 20190405012949
Implantable Lead Implant Date: 20111031
Implantable Lead Location: 753860
Implantable Lead Model: 4470
Implantable Lead Serial Number: 548557
Lead Channel Pacing Threshold Amplitude: 0.5 V
Lead Channel Pacing Threshold Amplitude: 0.625 V
Lead Channel Pacing Threshold Pulse Width: 0.4 ms
Lead Channel Pacing Threshold Pulse Width: 0.4 ms
Lead Channel Setting Sensing Sensitivity: 4 mV
MDC IDC LEAD IMPLANT DT: 20111031
MDC IDC LEAD LOCATION: 753859
MDC IDC LEAD SERIAL: 686076
MDC IDC MSMT BATTERY IMPEDANCE: 670 Ohm
MDC IDC MSMT BATTERY REMAINING LONGEVITY: 77 mo
MDC IDC MSMT BATTERY VOLTAGE: 2.77 V
MDC IDC MSMT LEADCHNL RA IMPEDANCE VALUE: 479 Ohm
MDC IDC MSMT LEADCHNL RV IMPEDANCE VALUE: 527 Ohm
MDC IDC PG IMPLANT DT: 20111031
MDC IDC SET LEADCHNL RA PACING AMPLITUDE: 2 V
MDC IDC SET LEADCHNL RV PACING AMPLITUDE: 2.5 V
MDC IDC SET LEADCHNL RV PACING PULSEWIDTH: 0.4 ms
MDC IDC STAT BRADY AP VP PERCENT: 1 %

## 2017-08-04 MED ORDER — NEOMYCIN-POLYMYXIN-HC 3.5-10000-1 OT SOLN: 3.0000 [drp] | Freq: Three times a day (TID) | OTIC | 1 refills | Status: DC

## 2017-08-04 NOTE — Assessment & Plan Note (Addendum)
Pt is requesting Hemoccult and may be a Cologuard test later Discussed screening tests value <82 yo. Pt would like to have hemoccults now

## 2017-08-04 NOTE — Assessment & Plan Note (Signed)
See procedure 

## 2017-08-04 NOTE — Progress Notes (Signed)
Subjective:  Patient ID: Kayla Davis, female    DOB: 07-23-1922  Age: 84 y.o. MRN: [id number]  CC: No chief complaint on file.   HPI Kayla Davis presents for HTN, hypothyroidism, rhinitis f/u Pt is requesting Hemoccult and may be a Cologuard test later  Outpatient Medications Prior to Visit  Medication Sig Dispense Refill  . ALPHA LIPOIC ACID PO Take 1 tablet by mouth daily.    [name] aspirin 325 MG tablet Take 325 mg by mouth daily.    [name] CRANBERRY PO Take 1 tablet by mouth daily with supper.    . Cyanocobalamin (VITAMIN B-12 PO) Take 1 tablet by mouth at bedtime.    Kayla Davis Calcium (STOOL SOFTENER PO) Take 2 capsules by mouth at bedtime.    [name] EVENING PRIMROSE OIL PO Take 1 capsule by mouth daily.    . fexofenadine (ALLEGRA) 30 MG tablet Take 30 mg by mouth 2 (two) times daily.    . furosemide (LASIX) 40 MG tablet Take 0.5 tablets (20 mg total) by mouth daily as needed for fluid or edema. 30 tablet 0  . furosemide (LASIX) 40 MG tablet Take 0.5 tablets (20 mg total) by mouth daily. 45 tablet 3  . hydrochlorothiazide (MICROZIDE) 12.5 MG capsule Take 1 capsule (12.5 mg total) by mouth daily. 90 capsule 3  . ipratropium (ATROVENT) 0.06 % nasal spray Place 2 sprays into both nostrils 3 (three) times daily. 45 mL 1  . Lancets (ONETOUCH ULTRASOFT) lancets Use to check blood sugars once a day Dx E11.9 100 each 3  . levothyroxine (SYNTHROID, LEVOTHROID) 100 MCG tablet Take 1 tablet (100 mcg total) by mouth daily before breakfast. 90 tablet 1  . losartan (COZAAR) 25 MG tablet TAKE 1 TABLET TWICE A DAY 180 tablet 3  . LYRICA 75 MG capsule TAKE 1 CAPSULE TWICE DAILY 180 capsule 3  . Magnesium 250 MG TABS Take 1 tablet daily by mouth.     . metoprolol tartrate (LOPRESSOR) 25 MG tablet Take 1 tablet (25 mg total) by mouth 2 (two) times daily. 180 tablet 1  . Multiple Vitamins-Minerals (EYE VITAMINS) CAPS Take 1 capsule by mouth daily.    Kayla Davis (SYSTANE OP) Apply  to eye daily.    . polyethylene Davis (MIRALAX) packet Mix 6 packets in one 32 oz bottle of gatorade, powerade, or other juice. Drink this bottle over the course of a day. If you have not had multiple bowel movements, you can start taking this three times a day, then decrease to once a day once you have liquid bowel movements. (Patient taking differently: Take 17 g by mouth once. ) 30 each 0  . simvastatin (ZOCOR) 40 MG tablet Take 1 tablet (40 mg total) by mouth daily at 6 PM. 90 tablet 3   No facility-administered medications prior to visit.     ROS Review of Systems  Constitutional: Negative for activity change, appetite change, chills, fatigue and unexpected weight change.  HENT: Positive for hearing loss. Negative for congestion, mouth sores and sinus pressure.   Eyes: Negative for visual disturbance.  Respiratory: Negative for cough and chest tightness.   Gastrointestinal: Negative for abdominal pain and nausea.  Genitourinary: Negative for difficulty urinating, frequency and vaginal pain.  Musculoskeletal: Positive for arthralgias and gait problem. Negative for back pain.  Skin: Negative for pallor and rash.  Neurological: Negative for dizziness, tremors, weakness, numbness and headaches.  Psychiatric/Behavioral: Negative for confusion and sleep disturbance.    Objective:  BP 124/76 (BP Location: Left Arm, Patient Position: Sitting, Cuff Size: Normal)   Pulse 71   Temp 98 F (36.7 C) (Oral)   Ht 5\' 5"  (1.651 m)   Wt 159 lb (72.1 kg)   LMP  (LMP Unknown)   SpO2 98%   BMI 26.46 kg/m   BP Readings from Last 3 Encounters:  08/04/17 124/76  06/17/17 (!) 122/58  06/08/17 126/62    Wt Readings from Last 3 Encounters:  08/04/17 159 lb (72.1 kg)  06/17/17 158 lb (71.7 kg)  06/08/17 160 lb (72.6 kg)    Physical Exam  Constitutional: She appears well-developed. No distress.  HENT:  Head: Normocephalic.  Right Ear: External ear normal.  Left Ear: External ear normal.    Nose: Nose normal.  Mouth/Throat: Oropharynx is clear and moist.  Eyes: Pupils are equal, round, and reactive to light. Conjunctivae are normal. Right eye exhibits no discharge. Left eye exhibits no discharge.  Neck: Normal range of motion. Neck supple. No JVD present. No tracheal deviation present. No thyromegaly present.  Cardiovascular: Normal rate, regular rhythm and normal heart sounds.  Pulmonary/Chest: No stridor. No respiratory distress. She has no wheezes.  Abdominal: Soft. Bowel sounds are normal. She exhibits no distension and no mass. There is no tenderness. There is no rebound and no guarding.  Musculoskeletal: She exhibits no edema or tenderness.  Lymphadenopathy:    She has no cervical adenopathy.  Neurological: She displays normal reflexes. No cranial nerve deficit. She exhibits normal muscle tone. Coordination normal.  Skin: No rash noted. No erythema.  Psychiatric: She has a normal mood and affect. Her behavior is normal. Judgment and thought content normal.  wax B Hearing aids B Hard hearing Walker   Procedure Note :     Procedure :  Ear irrigation   Indication:  Cerumen impaction   Risks, including pain, dizziness, eardrum perforation, bleeding, infection and others as well as benefits were explained to the patient in detail. Verbal consent was obtained and the patient agreed to proceed.   Procedure has  required manual wax removal with an ear wax curette and ear forceps.   Tolerated well. Complications: None.   Postprocedure instructions :  Call if problems.    Lab Results  Component Value Date   WBC 7.4 03/14/2017   HGB 12.3 03/14/2017   HCT 36.8 03/14/2017   PLT 260 03/14/2017   GLUCOSE 151 (H) 06/17/2017   CHOL 162 12/24/2016   TRIG 97.0 12/24/2016   HDL 80.70 12/24/2016   LDLDIRECT 143.0 10/07/2007   LDLCALC 62 12/24/2016   ALT 19 03/14/2017   AST 24 03/14/2017   NA 133 (L) 06/17/2017   K 4.5 06/17/2017   CL 93 (L) 06/17/2017   CREATININE  0.87 06/17/2017   BUN 21 06/17/2017   CO2 26 06/17/2017   TSH 6.38 (H) 12/24/2016   INR 1.06 07/03/2016   HGBA1C 6.6 (H) 12/24/2016    No results found.  Assessment & Plan:   There are no diagnoses linked to this encounter. I am having Kayla Davis. Quentin Cornwall maintain her ALPHA LIPOIC ACID PO, EVENING PRIMROSE OIL PO, EYE VITAMINS, CRANBERRY PO, Docusate Calcium (STOOL SOFTENER PO), Cyanocobalamin (VITAMIN B-12 PO), fexofenadine, aspirin, Polyethyl Davis-Propyl Davis (SYSTANE OP), onetouch ultrasoft, Magnesium, simvastatin, polyethylene Davis, losartan, LYRICA, hydrochlorothiazide, levothyroxine, metoprolol tartrate, furosemide, ipratropium, and furosemide.  No orders of the defined types were placed in this encounter.    Follow-up: No follow-ups on file.  [name], MD

## 2017-09-04 ENCOUNTER — Telehealth: Payer: Self-pay

## 2017-09-04 ENCOUNTER — Other Ambulatory Visit (INDEPENDENT_AMBULATORY_CARE_PROVIDER_SITE_OTHER): Payer: MEDICARE

## 2017-09-04 DIAGNOSIS — Z1211 Encounter for screening for malignant neoplasm of colon: Secondary | ICD-10-CM

## 2017-09-04 LAB — HEMOCCULT SLIDES (X 3 CARDS)
Fecal Occult Blood: NEGATIVE
OCCULT 1: NEGATIVE
OCCULT 2: NEGATIVE
OCCULT 3: NEGATIVE
OCCULT 4: NEGATIVE
OCCULT 5: NEGATIVE

## 2017-09-18 NOTE — Telephone Encounter (Signed)
error 

## 2017-10-16 ENCOUNTER — Telehealth: Payer: Self-pay | Admitting: Cardiology

## 2017-10-16 ENCOUNTER — Ambulatory Visit (INDEPENDENT_AMBULATORY_CARE_PROVIDER_SITE_OTHER): Payer: MEDICARE | Admitting: *Deleted

## 2017-10-16 DIAGNOSIS — I495 Sick sinus syndrome: Secondary | ICD-10-CM

## 2017-10-16 NOTE — Telephone Encounter (Signed)
LMOVM reminding pt to send remote transmission.   

## 2017-10-19 NOTE — Progress Notes (Signed)
Remote pacemaker transmission.   

## 2017-10-21 ENCOUNTER — Encounter: Payer: Self-pay | Admitting: Cardiology

## 2017-10-21 DIAGNOSIS — H04123 Dry eye syndrome of bilateral lacrimal glands: Secondary | ICD-10-CM | POA: Diagnosis not present

## 2017-10-21 DIAGNOSIS — H5212 Myopia, left eye: Secondary | ICD-10-CM | POA: Diagnosis not present

## 2017-10-21 DIAGNOSIS — H26492 Other secondary cataract, left eye: Secondary | ICD-10-CM | POA: Diagnosis not present

## 2017-10-21 DIAGNOSIS — E119 Type 2 diabetes mellitus without complications: Secondary | ICD-10-CM | POA: Diagnosis not present

## 2017-10-21 LAB — HM DIABETES EYE EXAM

## 2017-10-22 ENCOUNTER — Encounter: Payer: Self-pay | Admitting: Internal Medicine

## 2017-11-02 ENCOUNTER — Encounter: Payer: Self-pay | Admitting: Nurse Practitioner

## 2017-11-02 ENCOUNTER — Ambulatory Visit (INDEPENDENT_AMBULATORY_CARE_PROVIDER_SITE_OTHER): Payer: MEDICARE | Admitting: Nurse Practitioner

## 2017-11-02 VITALS — BP 140/62 | HR 62 | Ht 65.0 in | Wt 161.4 lb

## 2017-11-02 DIAGNOSIS — I10 Essential (primary) hypertension: Secondary | ICD-10-CM

## 2017-11-02 DIAGNOSIS — Z950 Presence of cardiac pacemaker: Secondary | ICD-10-CM | POA: Diagnosis not present

## 2017-11-02 DIAGNOSIS — I6521 Occlusion and stenosis of right carotid artery: Secondary | ICD-10-CM | POA: Diagnosis not present

## 2017-11-02 NOTE — Patient Instructions (Addendum)
We will be checking the following labs today - NONE   Medication Instructions:    Continue with your current medicines.     Testing/Procedures To Be Arranged:  N/A  Follow-Up:   See Dr. Jackalyn Lombard team in November.     Other Special Instructions:   N/A    If you need a refill on your cardiac medications before your next appointment, please call your pharmacy.   Call the [place] office at [phone] if you have any questions, problems or concerns.

## 2017-11-02 NOTE — Progress Notes (Signed)
CARDIOLOGY OFFICE NOTE  Date:  11/02/2017    Kayla Davis Date of Birth: [birth year] Medical Record #[id number]  PCP:  Kayla Anger, MD  Cardiologist:  Kayla Davis    Chief Complaint  Patient presents with  . Hypertension    Follow up visit - seen for Dr. Rayann Davis    History of Present Illness: Kayla Davis is a 84 y.o. female who presents today for a 4 month check. Seen for Dr. Rayann Davis.  She has a PPM in place due to bradycardia. Other issues include DM, HTN, HLD, TIA and hypothyroidism.   Seen here back in February with concern for volume overload - Lasix was given. I then saw her back for follow up - she was better. She has had prior issues with coumadin and has elected to remain on high dose aspirin therapy.   Comes in today. Here with her daughter. Has done ok since last visit here. She got mixed up one night last week - took extra BP medx by mistake - then stayed up all night taking her BP - was not sure what to do. BP stayed in the 140 range. She did not feel bad - admits she got distracted while watching TV and that that is what caused the mix up. She admits she likes to eat. Her weight is stable. Has had some recent diarrhea. Will have some DOE - sounds stable. No chest pain. No real swelling. Has not used any Lasix since I last saw. She feels like she is doing well.   Past Medical History:  Diagnosis Date  . Anemia, unspecified   . Anxiety state, unspecified   . Aortic valve disorders   . Bradycardia    s/p PPM  . Bursitis   . Complication of anesthesia    has had difficulty awakening in past; pt states did well with the anesthesia given during her carotid surgery   . Diabetes mellitus without complication ([place])   . Diverticulosis of colon (without mention of hemorrhage)   . Herpes zoster with other nervous system complications(053.19)   . Hyperlipidemia   . Hypertension   . Hypothyroidism   . Irritable bowel syndrome   . Menopause   .  Osteoarthrosis, unspecified whether generalized or localized, unspecified site   . Peripheral vascular disease ([place])   . TIA (transient ischemic attack)   . Unspecified hearing loss   . Varicose veins     Past Surgical History:  Procedure Laterality Date  . APPENDECTOMY    . CATARACT EXTRACTION, BILATERAL  end of June 2013  . CHOLECYSTECTOMY    . DILATION AND CURETTAGE OF UTERUS    . ENDARTERECTOMY Right 01/20/2014   Procedure: RIGHT CAROTID ARTERY ENDARTERECTOMY WITH DACRON PATCH ANGIOPLASTY;  Surgeon: Mal Misty, MD;  Location: [facility];  Service: Vascular;  Laterality: Right;  . PACEMAKER INSERTION     MDT dual chamber PPM implanted by Dr Kayla Davis for sinus node dysfunction   . TONSILLECTOMY    . TOTAL HIP ARTHROPLASTY Left 03/23/2015   Procedure: TOTAL HIP ARTHROPLASTY ANTERIOR APPROACH;  Surgeon: Gaynelle Arabian, MD;  Location: [facility];  Service: Orthopedics;  Laterality: Left;  [name] VAGINAL HYSTERECTOMY       Medications: Current Meds  Medication Sig  . ALPHA LIPOIC ACID PO Take 1 tablet by mouth daily.  [name] aspirin 325 MG tablet Take 325 mg by mouth daily.  [name] CRANBERRY PO Take 1 tablet by mouth daily with supper.  [name]  Cyanocobalamin (VITAMIN B-12 PO) Take 1 tablet by mouth at bedtime.  Kayla Davis Calcium (STOOL SOFTENER PO) Take 2 capsules by mouth at bedtime.  [name] EVENING PRIMROSE OIL PO Take 1 capsule by mouth daily.  . fexofenadine (ALLEGRA) 30 MG tablet Take 30 mg by mouth 2 (two) times daily.  . furosemide (LASIX) 40 MG tablet Take 0.5 tablets (20 mg total) by mouth daily as needed for fluid or edema.  . hydrochlorothiazide (MICROZIDE) 12.5 MG capsule Take 1 capsule (12.5 mg total) by mouth daily.  [name] ipratropium (ATROVENT) 0.06 % nasal spray Place 2 sprays into both nostrils 3 (three) times daily.  . Lancets (ONETOUCH ULTRASOFT) lancets Use to check blood sugars once a day Dx E11.9  . levothyroxine (SYNTHROID, LEVOTHROID) 100 MCG tablet Take 1 tablet (100 mcg total) by mouth daily  before breakfast.  . losartan (COZAAR) 25 MG tablet TAKE 1 TABLET TWICE A DAY  . LYRICA 75 MG capsule TAKE 1 CAPSULE TWICE DAILY  . Magnesium 250 MG TABS Take 1 tablet daily by mouth.   . Multiple Vitamins-Minerals (EYE VITAMINS) CAPS Take 1 capsule by mouth daily.  Kayla Davis [name] (SYSTANE OP) Apply to eye daily.  . polyethylene glycol (MIRALAX / GLYCOLAX) packet Take 17 g by mouth daily as needed for mild constipation.  . simvastatin (ZOCOR) 40 MG tablet Take 1 tablet (40 mg total) by mouth daily at 6 PM.     Allergies: Allergies  Allergen Reactions  . Penicillins Swelling and Other (See Comments)    BODY SWELLS & PT STATES THAT SHE ALMOST DIED Has patient had a PCN reaction causing immediate rash, facial/tongue/throat swelling, SOB or lightheadedness with hypotension: Yes Has patient had a PCN reaction causing severe rash involving mucus membranes or skin necrosis: known Has patient had a PCN reaction that required hospitalization Yes Has patient had a PCN reaction occurring within the last 10 years: No If all of the above answers are "NO", then may proceed with Cephalosporin u  . Alprazolam Swelling    EYES SWELL  . Bactrim [Sulfamethoxazole-Trimethoprim]     Unable to remember the side effects  . Ceclor [Cefaclor]     Unsure of the side effects  . Cephalexin     Unsure of the side effects  . Ciprofloxacin Itching and Swelling    Pt states that she tolerated po cipro well, but not the higher dose given IV  . Clindamycin/Lincomycin Nausea Only    Upset stomach  . Hydrocodone     Unsure of reaction to med  . Noroxin [Norfloxacin]     Unsure of side effects  . Sulfonamide Derivatives Itching  . Tetracyclines & Related     Unsure of the reaction to this medication  . Tramadol Other (See Comments)    Did not like how she "felt"    Social History: The patient  reports that she has never smoked. She has never used smokeless tobacco. She reports that she does  not drink alcohol or use drugs.   Family History: The patient's family history includes Cancer in her paternal grandmother; Diabetes in her father; Heart disease in her father and mother.   Review of Systems: Please see the history of present illness.   Otherwise, the review of systems is positive for none.   All other systems are reviewed and negative.   Physical Exam: VS:  BP 140/62 (BP Location: Left Arm, Patient Position: Sitting, Cuff Size: Normal)   Pulse 62   Ht 5'  5" (1.651 m)   Wt 161 lb 6.4 oz (73.2 kg)   LMP  (LMP Unknown)   SpO2 97% Comment: at rest  BMI 26.86 kg/m  .  BMI Body mass index is 26.86 kg/m.  Wt Readings from Last 3 Encounters:  11/02/17 161 lb 6.4 oz (73.2 kg)  08/04/17 159 lb (72.1 kg)  06/17/17 158 lb (71.7 kg)    General: Elderly female. Alert and in no acute distress.  She is using a walker.  HEENT: Normal. Little hard of hearing.  Neck: Supple, no JVD, carotid bruits, or masses noted.  Cardiac: Regular rate and rhythm. Soft outflow murmur.  No edema.  Respiratory:  Lungs are clear to auscultation bilaterally with normal work of breathing.  GI: Soft and nontender.  MS: No deformity or atrophy. Gait and ROM intact. Using a walker.   Skin: Warm and dry. Color is normal.  Neuro:  Strength and sensation are intact and no gross focal deficits noted.  Psych: Alert, appropriate and with normal affect.   LABORATORY DATA:  EKG:  EKG is not ordered today.  Lab Results  Component Value Date   WBC 7.4 03/14/2017   HGB 12.3 03/14/2017   HCT 36.8 03/14/2017   PLT 260 03/14/2017   GLUCOSE 151 (H) 06/17/2017   CHOL 162 12/24/2016   TRIG 97.0 12/24/2016   HDL 80.70 12/24/2016   LDLDIRECT 143.0 10/07/2007   LDLCALC 62 12/24/2016   ALT 19 03/14/2017   AST 24 03/14/2017   NA 133 (L) 06/17/2017   K 4.5 06/17/2017   CL 93 (L) 06/17/2017   CREATININE 0.87 06/17/2017   BUN 21 06/17/2017   CO2 26 06/17/2017   TSH 6.38 (H) 12/24/2016   INR 1.06  07/03/2016   HGBA1C 6.6 (H) 12/24/2016     BNP (last 3 results) No results for input(s): BNP in the last 8760 hours.  ProBNP (last 3 results) No results for input(s): PROBNP in the last 8760 hours.   Other Studies Reviewed Today:  Echo Study Conclusions 06/2016  - Left ventricle: The cavity size was normal. There was moderate   concentric hypertrophy. Systolic function was vigorous. The   estimated ejection fraction was in the range of 65% to 70%. Wall   motion was normal; there were no regional wall motion   abnormalities. Doppler parameters are consistent with abnormal   left ventricular relaxation (grade 1 diastolic dysfunction).   Doppler parameters are consistent with elevated ventricular   end-diastolic filling pressure. - Aortic valve: Trileaflet; moderately thickened, moderately   calcified leaflets. There was mild regurgitation. - Aortic root: The aortic root was normal in size. - Mitral valve: The findings are consistent with mild stenosis.   Valve area by continuity equation (using LVOT flow): 1.35 cm^2. - Right ventricle: Pacer wire or catheter noted in right ventricle. - Right atrium: Pacer wire or catheter noted in right atrium. - Pulmonary arteries: Systolic pressure was mildly increased. PA   peak pressure: 35 mm Hg (S). - Inferior vena cava: The vessel was dilated. The respirophasic   diameter changes were blunted (< 50%), consistent with elevated   central venous pressure.  Assessment/Plan:  1. Dyspnea - seems to be at her baseline. She is not using and prn Lasix.   2. Underlying PPM for symptomatic bradycardia - followed by EP  3. HTN - BP looks fine on her current regimen. She did have one mix up - she clearly notes she got distracted - I told her if  this happens again, to call, take extra salt and then we need someone overseeing her medicines.   4. Advanced age - still doing quite well.    Current medicines are reviewed with the patient today.   The patient does not have concerns regarding medicines other than what has been noted above.  The following changes have been made:  See above.  Labs/ tests ordered today include:   No orders of the defined types were placed in this encounter.    Disposition:   FU with Dr. Rayann Davis and his team as planned.    Patient is agreeable to this plan and will call if any problems develop in the interim.   SignedTruitt Merle, NP  11/02/2017 2:40 PM  [facility] [address] [place], [place]  [address] Phone: [phone] Fax: [phone]

## 2017-11-04 ENCOUNTER — Encounter (HOSPITAL_COMMUNITY): Payer: Self-pay

## 2017-11-04 ENCOUNTER — Other Ambulatory Visit: Payer: Self-pay

## 2017-11-04 ENCOUNTER — Emergency Department (HOSPITAL_COMMUNITY)
Admission: EM | Admit: 2017-11-04 | Discharge: 2017-11-04 | Disposition: A | Discharge status: Home / Self Care | Payer: MEDICARE | Attending: Emergency Medicine | Admitting: Emergency Medicine

## 2017-11-04 DIAGNOSIS — N390 Urinary tract infection, site not specified: Secondary | ICD-10-CM | POA: Diagnosis not present

## 2017-11-04 DIAGNOSIS — Z8673 Personal history of transient ischemic attack (TIA), and cerebral infarction without residual deficits: Secondary | ICD-10-CM | POA: Insufficient documentation

## 2017-11-04 DIAGNOSIS — E039 Hypothyroidism, unspecified: Secondary | ICD-10-CM | POA: Insufficient documentation

## 2017-11-04 DIAGNOSIS — E785 Hyperlipidemia, unspecified: Secondary | ICD-10-CM | POA: Diagnosis not present

## 2017-11-04 DIAGNOSIS — E119 Type 2 diabetes mellitus without complications: Secondary | ICD-10-CM | POA: Insufficient documentation

## 2017-11-04 DIAGNOSIS — Z79899 Other long term (current) drug therapy: Secondary | ICD-10-CM | POA: Insufficient documentation

## 2017-11-04 DIAGNOSIS — R319 Hematuria, unspecified: Secondary | ICD-10-CM | POA: Diagnosis present

## 2017-11-04 DIAGNOSIS — I10 Essential (primary) hypertension: Secondary | ICD-10-CM | POA: Insufficient documentation

## 2017-11-04 DIAGNOSIS — Z950 Presence of cardiac pacemaker: Secondary | ICD-10-CM | POA: Insufficient documentation

## 2017-11-04 DIAGNOSIS — R310 Gross hematuria: Secondary | ICD-10-CM | POA: Diagnosis not present

## 2017-11-04 LAB — URINALYSIS, ROUTINE W REFLEX MICROSCOPIC
Bilirubin Urine: NEGATIVE
Glucose, UA: NEGATIVE mg/dL
Ketones, ur: NEGATIVE mg/dL
NITRITE: POSITIVE — AB
PH: 6 (ref 5.0–8.0)
Protein, ur: NEGATIVE mg/dL
RBC / HPF: >50 RBC/hpf — ABNORMAL HIGH (ref 0–5)
SPECIFIC GRAVITY, URINE: 1.011 (ref 1.005–1.030)
WBC, UA: >50 WBC/hpf — ABNORMAL HIGH (ref 0–5)

## 2017-11-04 MED ORDER — FOSFOMYCIN TROMETHAMINE 3 G PO PACK
3.0000 g | PACK | Freq: Once | ORAL | Status: AC
  Administered 2017-11-04: 3 g via ORAL
  Filled 2017-11-04: qty 3

## 2017-11-04 NOTE — ED Triage Notes (Signed)
Pt states that last night she noticed some blood in her urine and again today, denies dysuria, no fevers or abd pain

## 2017-11-04 NOTE — ED Provider Notes (Signed)
[facility] EMERGENCY DEPARTMENT Provider Note   CSN: [id number] Arrival date & time: 11/04/17  1907     History   Chief Complaint Chief Complaint  Patient presents with  . Hematuria    HPI Kayla Davis is a 84 y.o. female.  84 yo F with a chief complaint hematuria.  Going on for the past couple days.  Off and on.  Described it is a pinkish tinge.  Denies any abdominal pain denies flank pain denies fever denies fevers denies dysuria increased frequency or hesitancy.  She told me that she has had a lot of gas.  This is a chronic issue for her.  Not significantly changed.  The history is provided by the patient.  Hematuria  This is a new problem. The current episode started yesterday. The problem occurs constantly. The problem has not changed since onset.Pertinent negatives include no chest pain, no headaches and no shortness of breath. Nothing aggravates the symptoms. Nothing relieves the symptoms. She has tried nothing for the symptoms. The treatment provided no relief.    Past Medical History:  Diagnosis Date  . Anemia, unspecified   . Anxiety state, unspecified   . Aortic valve disorders   . Bradycardia    s/p PPM  . Bursitis   . Complication of anesthesia    has had difficulty awakening in past; pt states did well with the anesthesia given during her carotid surgery   . Diabetes mellitus without complication ([place])   . Diverticulosis of colon (without mention of hemorrhage)   . Herpes zoster with other nervous system complications(053.19)   . Hyperlipidemia   . Hypertension   . Hypothyroidism   . Irritable bowel syndrome   . Menopause   . Osteoarthrosis, unspecified whether generalized or localized, unspecified site   . Peripheral vascular disease ([place])   . TIA (transient ischemic attack)   . Unspecified hearing loss   . Varicose veins     Patient Active Problem List   Diagnosis Date Noted  . Colon cancer screening 08/04/2017  . Abdominal  pain 03/13/2017  . Amaurosis 07/03/2016  . Diet-controlled type 2 diabetes mellitus ([place]) 02/29/2016  . Vertigo 12/11/2015  . Gait disorder 06/27/2015  . OA (osteoarthritis) of hip 03/23/2015  . Preop exam for internal medicine 03/04/2015  . Shortness of breath 02/13/2015  . Rhinitis, atrophic 11/22/2014  . Well adult exam 04/21/2014  . Amaurosis fugax 03/31/2014  . History of CEA (carotid endarterectomy) 03/31/2014  . HLD (hyperlipidemia) 03/31/2014  . Carotid stenosis 02/07/2014  . Cerebral infarction ([place]) 01/16/2014  . Dyslipidemia 01/16/2014  . Weakness generalized 04/30/2013  . CAP (community acquired pneumonia) 04/29/2013  . Edema 03/22/2013  . Urinary incontinence in female 03/22/2013  . Constipation 10/01/2012  . UTI (urinary tract infection) 10/01/2012  . Diarrhea 08/26/2012  . Cerumen impaction 12/11/2011  . Pacemaker-Medtronic 07/30/2010  . SINUS BRADYCARDIA 05/17/2010  . DYSURIA 01/29/2010  . Aortic valve disorders 07/31/2009  . Venous (peripheral) insufficiency 07/31/2009  . Hyponatremia 07/01/2009  . LEG CRAMPS 06/12/2009  . LEG PAIN, BILATERAL 02/01/2009  . Backache 08/24/2008  . Anemia 06/21/2008  . DEGENERATIVE JOINT DISEASE 02/19/2008  . CHEST WALL PAIN, HX OF 02/19/2008  . IRRITABLE BOWEL SYNDROME 12/28/2007  . DIABETES MELLITUS, BORDERLINE 11/06/2007  . GASTRITIS 07/08/2007  . POSTHERPETIC NEURALGIA 07/01/2007  . Herpes zoster without mention of complication 23/55/7322  . Hypothyroidism 06/08/2007  . HYPERLIPIDEMIA 06/08/2007  . Anxiety state 06/08/2007  . Hereditary and idiopathic peripheral neuropathy  06/08/2007  . HEARING LOSS 06/08/2007  . Essential hypertension 06/08/2007  . Transient cerebral ischemia 06/08/2007  . BRONCHITIS, RECURRENT 06/08/2007  . DIVERTICULOSIS OF COLON 06/08/2007  . Osteoporosis 06/08/2007    Past Surgical History:  Procedure Laterality Date  . APPENDECTOMY    . CATARACT EXTRACTION, BILATERAL  end of June 2013    . CHOLECYSTECTOMY    . DILATION AND CURETTAGE OF UTERUS    . ENDARTERECTOMY Right 01/20/2014   Procedure: RIGHT CAROTID ARTERY ENDARTERECTOMY WITH DACRON PATCH ANGIOPLASTY;  Surgeon: Mal Misty, MD;  Location: [facility];  Service: Vascular;  Laterality: Right;  . PACEMAKER INSERTION     MDT dual chamber PPM implanted by Dr [name] for sinus node dysfunction   . TONSILLECTOMY    . TOTAL HIP ARTHROPLASTY Left 03/23/2015   Procedure: TOTAL HIP ARTHROPLASTY ANTERIOR APPROACH;  Surgeon: Gaynelle Arabian, MD;  Location: [facility];  Service: Orthopedics;  Laterality: Left;  [name] VAGINAL HYSTERECTOMY       OB History   None      Home Medications    Prior to Admission medications   Medication Sig Start Date End Date Taking? Authorizing Provider  ALPHA LIPOIC ACID PO Take 1 tablet by mouth daily.    [provider]  aspirin 325 MG tablet Take 325 mg by mouth daily.    [provider]  CRANBERRY PO Take 1 tablet by mouth daily with supper.    [provider]  Cyanocobalamin (VITAMIN B-12 PO) Take 1 tablet by mouth at bedtime.    [provider]  Docusate Calcium (STOOL SOFTENER PO) Take 2 capsules by mouth at bedtime.    [provider]  EVENING PRIMROSE OIL PO Take 1 capsule by mouth daily.    [provider]  fexofenadine (ALLEGRA) 30 MG tablet Take 30 mg by mouth 2 (two) times daily.    [provider]  furosemide (LASIX) 40 MG tablet Take 0.5 tablets (20 mg total) by mouth daily as needed for fluid or edema. 06/17/17 11/02/17  Burtis Junes, NP  hydrochlorothiazide (MICROZIDE) 12.5 MG capsule Take 1 capsule (12.5 mg total) by mouth daily. 05/01/17   [name], Jeneen Rinks, MD  ipratropium (ATROVENT) 0.06 % nasal spray Place 2 sprays into both nostrils 3 (three) times daily. 06/25/17   Plotnikov, Evie Lacks, MD  Lancets [facility] ULTRASOFT) lancets Use to check blood sugars once a day Dx E11.9 09/17/16   Plotnikov, Evie Lacks, MD  levothyroxine  (SYNTHROID, LEVOTHROID) 100 MCG tablet Take 1 tablet (100 mcg total) by mouth daily before breakfast. 05/29/17   Plotnikov, Evie Lacks, MD  losartan (COZAAR) 25 MG tablet TAKE 1 TABLET TWICE A DAY 04/06/17   Plotnikov, Evie Lacks, MD  LYRICA 75 MG capsule TAKE 1 CAPSULE TWICE DAILY 04/06/17   Plotnikov, Evie Lacks, MD  Magnesium 250 MG TABS Take 1 tablet daily by mouth.     [provider]  metoprolol tartrate (LOPRESSOR) 25 MG tablet Take 1 tablet (25 mg total) by mouth 2 (two) times daily. 05/29/17 08/27/17  Plotnikov, Evie Lacks, MD  Multiple Vitamins-Minerals (EYE VITAMINS) CAPS Take 1 capsule by mouth daily.    [provider]  Polyethyl [name] (SYSTANE OP) Apply to eye daily.    [provider]  polyethylene glycol (MIRALAX / GLYCOLAX) packet Take 17 g by mouth daily as needed for mild constipation.    [provider]  simvastatin (ZOCOR) 40 MG tablet Take 1 tablet (40 mg total) by mouth  daily at 6 PM. 01/20/17   Plotnikov, Evie Lacks, MD    Family History Family History  Problem Relation Age of Onset  . Heart disease Mother   . Heart disease Father   . Diabetes Father   . Cancer Paternal Grandmother     Social History Social History   Tobacco Use  . Smoking status: Never Smoker  . Smokeless tobacco: Never Used  Substance Use Topics  . Alcohol use: No    Alcohol/week: 0.0 oz  . Drug use: No     Allergies   Penicillins; Alprazolam; Bactrim [sulfamethoxazole-trimethoprim]; Ceclor [cefaclor]; Cephalexin; Ciprofloxacin; Clindamycin/lincomycin; Hydrocodone; Noroxin [norfloxacin]; Sulfonamide derivatives; Tetracyclines & related; and Tramadol   Review of Systems Review of Systems  Constitutional: Negative for chills and fever.  HENT: Negative for congestion and rhinorrhea.   Eyes: Negative for redness and visual disturbance.  Respiratory: Negative for shortness of breath and wheezing.   Cardiovascular: Negative for chest pain and  palpitations.  Gastrointestinal: Negative for nausea and vomiting.  Genitourinary: Positive for hematuria. Negative for dysuria and urgency.  Musculoskeletal: Negative for arthralgias and myalgias.  Skin: Negative for pallor and wound.  Neurological: Negative for dizziness and headaches.     Physical Exam Updated Vital Signs BP (!) 160/68   Pulse 60   Temp 98.6 F (37 C) (Oral)   Resp 20   LMP  (LMP Unknown)   SpO2 96%   Physical Exam  Constitutional: She is oriented to person, place, and time. She appears well-developed and well-nourished. No distress.  HENT:  Head: Normocephalic and atraumatic.  Eyes: Pupils are equal, round, and reactive to light. EOM are normal.  Neck: Normal range of motion. Neck supple.  Cardiovascular: Normal rate and regular rhythm. Exam reveals no gallop and no friction rub.  No murmur heard. Pulmonary/Chest: Effort normal. She has no wheezes. She has no rales.  Abdominal: Soft. She exhibits no distension and no mass. There is no tenderness. There is no guarding.  Genitourinary:  Genitourinary Comments: No noted blood noted in the vaginal vault, none at the opening of the urethral meatus.  No dark stool or gross blood on rectal exam.  Musculoskeletal: She exhibits no edema or tenderness.  Neurological: She is alert and oriented to person, place, and time.  Skin: Skin is warm and dry. She is not diaphoretic.  Psychiatric: She has a normal mood and affect. Her behavior is normal.  Nursing note and vitals reviewed.    ED Treatments / Results  Labs (all labs ordered are listed, but only abnormal results are displayed) Labs Reviewed  URINALYSIS, ROUTINE W REFLEX MICROSCOPIC - Abnormal; Notable for the following components:      Result Value   APPearance HAZY (*)    Hgb urine dipstick LARGE (*)    Nitrite POSITIVE (*)    Leukocytes, UA LARGE (*)    RBC / HPF >50 (*)    WBC, UA >50 (*)    Bacteria, UA RARE (*)    Non Squamous Epithelial 0-5 (*)      All other components within normal limits  URINE CULTURE    EKG None  Radiology No results found.  Procedures Procedures (including critical care time)  Medications Ordered in ED Medications  fosfomycin (MONUROL) packet 3 g (has no administration in time range)     Initial Impression / Assessment and Plan / ED Course  I have reviewed the triage vital signs and the nursing notes.  Pertinent labs & imaging results that were  available during my care of the patient were reviewed by me and considered in my medical decision making (see chart for details).     84 yo F with a chief complaint of hematuria.  Describes this is a pinkish tinge to her urine.  Denies any other symptoms.  My exam with no abdominal tenderness.  Urine with nitrate positive too numerous to count reds and whites.  Will treat as a UTI.  Clinically no blood in the vagina or the rectum.  The patient has numerous antibiotic intolerances listed in her med list.  We will give 1 dose of fosfomycin.  Follow with PCP in a week.  9:05 PM:  I have discussed the diagnosis/risks/treatment options with the patient and family and believe the pt to be eligible for discharge home to follow-up with PCP. We also discussed returning to the ED immediately if new or worsening sx occur. We discussed the sx which are most concerning (e.g., sudden worsening pain, fever, inability to tolerate by mouth) that necessitate immediate return. Medications administered to the patient during their visit and any new prescriptions provided to the patient are listed below.  Medications given during this visit Medications  fosfomycin (MONUROL) packet 3 g (has no administration in time range)     The patient appears reasonably screen and/or stabilized for discharge and I doubt any other medical condition or other [facility] requiring further screening, evaluation, or treatment in the ED at this time prior to discharge.    Final Clinical Impressions(s) /  ED Diagnoses   Final diagnoses:  Gross hematuria  Lower urinary tract infectious disease    ED Discharge Orders    None       Deno Etienne, DO 11/04/17 2105

## 2017-11-04 NOTE — Discharge Instructions (Signed)
Follow up with your PCP.  Return for fevers, vomiting.

## 2017-11-04 NOTE — ED Provider Notes (Addendum)
Patient placed in Quick Look pathway, seen and evaluated   Chief Complaint: hematuria  HPI:   Kayla Davis is a 84 y.o. female who presents to the ED with blood in her urine that she first noted last night and then again today. Patient denies pain, fever or other symptoms.   ROS: GU: hematuria  Physical Exam:  BP (!) 160/68   Pulse 60   Temp 98.6 F (37 C) (Oral)   Resp 20   LMP  (LMP Unknown)   SpO2 96%    Gen: No distress  Neuro: Awake and Alert  Skin: Warm and dry  Abdomen: soft, non tender with palpation    Initiation of care has begun. The patient has been counseled on the process, plan, and necessity for staying for the completion/evaluation, and the remainder of the medical screening examination    Ashley Murrain, NP 11/04/17 1917    Debroah Baller [place], NP 11/07/17 1612    Virgel Manifold, MD 11/08/17 1459

## 2017-11-06 LAB — URINE CULTURE

## 2017-11-07 ENCOUNTER — Telehealth: Payer: Self-pay

## 2017-11-07 NOTE — Telephone Encounter (Signed)
Post ED Visit - Positive Culture Follow-up  Culture report reviewed by antimicrobial stewardship pharmacist:  []  Elenor Quinones, Pharm.D. []  Heide Guile, Pharm.D., BCPS AQ-ID []  Parks Neptune, Pharm.D., BCPS []  Alycia Rossetti, Pharm.D., BCPS []  [place], [place].D., BCPS, AAHIVP []  [name], Pharm.D., BCPS, AAHIVP []  Salome Arnt, PharmD, BCPS []  Johnnette Gourd, PharmD, BCPS []  Hughes Better, PharmD, BCPS [x]  Leeroy Cha, PharmD  Positive urine culture Treated with Fosfomycin, organism sensitive to the same and no further patient follow-up is required at this time.  Genia Del 11/07/2017, 9:58 AM

## 2017-11-09 ENCOUNTER — Telehealth: Payer: Self-pay

## 2017-11-09 NOTE — Telephone Encounter (Signed)
Please advise about referral  Copied from [place] [phone]. Topic: Referral - Request >> Nov 06, 2017  2:21 PM Kayla Davis, NT wrote: Reason for CRM: Patient called and states that she is hurting so bad that she needs a referral to Urology. Patient is wondering if Dr. Alain Marion can send in that referral for her. Please advise. Thank you

## 2017-11-10 LAB — CUP PACEART REMOTE DEVICE CHECK
Brady Statistic AP VP Percent: 1 %
Brady Statistic AP VS Percent: 94 %
Brady Statistic AS VS Percent: 5 %
Date Time Interrogation Session: 20190706013728
Implantable Lead Implant Date: 20111031
Implantable Lead Implant Date: 20111031
Implantable Lead Location: 753860
Implantable Lead Model: 4469
Implantable Lead Serial Number: 548557
Implantable Lead Serial Number: 686076
Lead Channel Impedance Value: 551 Ohm
Lead Channel Pacing Threshold Amplitude: 0.625 V
Lead Channel Pacing Threshold Pulse Width: 0.4 ms
Lead Channel Sensing Intrinsic Amplitude: 11.2 mV
Lead Channel Setting Pacing Amplitude: 2 V
Lead Channel Setting Sensing Sensitivity: 4 mV
MDC IDC LEAD LOCATION: 753859
MDC IDC MSMT BATTERY IMPEDANCE: 744 Ohm
MDC IDC MSMT BATTERY REMAINING LONGEVITY: 73 mo
MDC IDC MSMT BATTERY VOLTAGE: 2.77 V
MDC IDC MSMT LEADCHNL RA IMPEDANCE VALUE: 494 Ohm
MDC IDC MSMT LEADCHNL RA PACING THRESHOLD AMPLITUDE: 0.5 V
MDC IDC MSMT LEADCHNL RA PACING THRESHOLD PULSEWIDTH: 0.4 ms
MDC IDC PG IMPLANT DT: 20111031
MDC IDC SET LEADCHNL RV PACING AMPLITUDE: 2.5 V
MDC IDC SET LEADCHNL RV PACING PULSEWIDTH: 0.4 ms
MDC IDC STAT BRADY AS VP PERCENT: 0 %

## 2017-11-10 NOTE — Telephone Encounter (Signed)
What part is hurting? Thx

## 2017-11-11 ENCOUNTER — Inpatient Hospital Stay: Payer: MEDICARE | Admitting: Internal Medicine

## 2017-11-11 NOTE — Telephone Encounter (Signed)
Pt has appt tomorrow at 930

## 2017-11-12 DIAGNOSIS — N3000 Acute cystitis without hematuria: Secondary | ICD-10-CM | POA: Diagnosis not present

## 2017-11-12 DIAGNOSIS — R310 Gross hematuria: Secondary | ICD-10-CM | POA: Diagnosis not present

## 2017-11-18 ENCOUNTER — Inpatient Hospital Stay: Payer: MEDICARE | Admitting: Internal Medicine

## 2017-11-19 ENCOUNTER — Ambulatory Visit: Payer: Self-pay | Admitting: Internal Medicine

## 2017-11-19 NOTE — Telephone Encounter (Signed)
  Pt c/o coughing up "black flakes" since Monday of this week. Pt stated that she coughs mostly at night or when she lies down. Pt stated that she takes cough drops at night to ease her cough.  Pt stated she gets SOB with walking and walking up stairs.  Pt stated that she coughs up the black flakes in the commode. Pt denies any blood or reddish brown tinged sputum.  Pt has h/o pneumonia 5 to 6 years ago. Pt denies fever or chills. Pt has h/o pacemaker and carotid endarterectomy. Pt c/o runny nose. Pt denies wheezing or chest pain.Hulen Skains PCP office and discussed pt triage information with Sam. Sam agreed that pt could be seen in the office. Pt agreeable with appointment tomorrow with Dr  Alain Marion at  4:00 pm. Pt stated her daughter will bring her to appointment. Reason for Disposition . Cough has been present for > 3 weeks    Pt coughing black flakes from lungs.  Answer Assessment - Initial Assessment Questions 1. ONSET: "When did the cough begin?"      Monday 2. SEVERITY: "How bad is the cough today?"     Cough occurs mostly at night or when she lays down 3. RESPIRATORY DISTRESS: "Describe your breathing."      Gets short of breath with walking   4. FEVER: "Do you have a fever?" If so, ask: "What is your temperature, how was it measured, and when did it start?"     no 5. SPUTUM: "Describe the color of your sputum" (clear, white, yellow, green)     "Black flakes" that she spits into the commode 6. HEMOPTYSIS: "Are you coughing up any blood?" If so ask: "How much?" (flecks, streaks, tablespoons, etc.)     no 7. CARDIAC HISTORY: "Do you have any history of heart disease?" (e.g., heart attack, congestive heart failure)      Pacemaker, right carotid endarterectomy 8. LUNG HISTORY: "Do you have any history of lung disease?"  (e.g., pulmonary embolus, asthma, emphysema)     H/o pneumonia 5-6 years ago 59. PE RISK FACTORS: "Do you have a history of blood clots?" (or: recent major surgery, recent  prolonged travel, bedridden)     no 10. OTHER SYMPTOMS: "Do you have any other symptoms?" (e.g., runny nose, wheezing, chest pain)       Runny nose 11. PREGNANCY: "Is there any chance you are pregnant?" "When was your last menstrual period?"       n/a 12. TRAVEL: "Have you traveled out of the country in the last month?" (e.g., travel history, exposures)       no  Protocols used: [place]

## 2017-11-20 ENCOUNTER — Other Ambulatory Visit (INDEPENDENT_AMBULATORY_CARE_PROVIDER_SITE_OTHER): Payer: MEDICARE

## 2017-11-20 ENCOUNTER — Ambulatory Visit (INDEPENDENT_AMBULATORY_CARE_PROVIDER_SITE_OTHER)
Admission: RE | Admit: 2017-11-20 | Discharge: 2017-11-20 | Disposition: A | Discharge status: Home / Self Care | Payer: MEDICARE | Source: Ambulatory Visit | Attending: Internal Medicine | Admitting: Internal Medicine

## 2017-11-20 ENCOUNTER — Encounter: Payer: Self-pay | Admitting: Internal Medicine

## 2017-11-20 ENCOUNTER — Ambulatory Visit (INDEPENDENT_AMBULATORY_CARE_PROVIDER_SITE_OTHER): Payer: MEDICARE | Admitting: Internal Medicine

## 2017-11-20 VITALS — BP 150/82 | HR 62 | Temp 97.7°F | Ht 65.0 in | Wt 156.0 lb

## 2017-11-20 DIAGNOSIS — R05 Cough: Secondary | ICD-10-CM | POA: Diagnosis not present

## 2017-11-20 DIAGNOSIS — R059 Cough, unspecified: Secondary | ICD-10-CM | POA: Insufficient documentation

## 2017-11-20 DIAGNOSIS — R269 Unspecified abnormalities of gait and mobility: Secondary | ICD-10-CM

## 2017-11-20 DIAGNOSIS — R319 Hematuria, unspecified: Secondary | ICD-10-CM | POA: Insufficient documentation

## 2017-11-20 DIAGNOSIS — E119 Type 2 diabetes mellitus without complications: Secondary | ICD-10-CM

## 2017-11-20 DIAGNOSIS — I10 Essential (primary) hypertension: Secondary | ICD-10-CM

## 2017-11-20 DIAGNOSIS — I6521 Occlusion and stenosis of right carotid artery: Secondary | ICD-10-CM

## 2017-11-20 LAB — BASIC METABOLIC PANEL
BUN: 16 mg/dL (ref 6–23)
CHLORIDE: 87 meq/L — AB (ref 96–112)
CO2: 30 meq/L (ref 19–32)
Calcium: 9.6 mg/dL (ref 8.4–10.5)
Creatinine, Ser: 0.88 mg/dL (ref 0.40–1.20)
GFR: 63.75 mL/min (ref >60.00)
GLUCOSE: 84 mg/dL (ref 70–99)
POTASSIUM: 4.1 meq/L (ref 3.5–5.1)
SODIUM: 122 meq/L — AB (ref 135–145)

## 2017-11-20 LAB — CBC WITH DIFFERENTIAL/PLATELET
BASOS ABS: 0.1 10*3/uL (ref 0.0–0.1)
Basophils Relative: 1.8 % (ref 0.0–3.0)
Eosinophils Absolute: 0.2 10*3/uL (ref 0.0–0.7)
Eosinophils Relative: 3.8 % (ref 0.0–5.0)
HCT: 35.3 % — ABNORMAL LOW (ref 36.0–46.0)
Hemoglobin: 12.1 g/dL (ref 12.0–15.0)
LYMPHS ABS: 2.3 10*3/uL (ref 0.7–4.0)
LYMPHS PCT: 37.8 % (ref 12.0–46.0)
MCHC: 34.3 g/dL (ref 30.0–36.0)
MCV: 93.2 fl (ref 78.0–100.0)
MONOS PCT: 10.9 % (ref 3.0–12.0)
Monocytes Absolute: 0.7 10*3/uL (ref 0.1–1.0)
NEUTROS ABS: 2.8 10*3/uL (ref 1.4–7.7)
NEUTROS PCT: 45.7 % (ref 43.0–77.0)
PLATELETS: 258 10*3/uL (ref 150.0–400.0)
RBC: 3.79 Mil/uL — AB (ref 3.87–5.11)
RDW: 13.1 % (ref 11.5–15.5)
WBC: 6.1 10*3/uL (ref 4.0–10.5)

## 2017-11-20 LAB — HEPATIC FUNCTION PANEL
ALT: 12 U/L (ref 0–35)
AST: 15 U/L (ref 0–37)
Albumin: 4.2 g/dL (ref 3.5–5.2)
Alkaline Phosphatase: 77 U/L (ref 39–117)
BILIRUBIN DIRECT: 0.1 mg/dL (ref 0.0–0.3)
Total Bilirubin: 0.5 mg/dL (ref 0.2–1.2)
Total Protein: 6.8 g/dL (ref 6.0–8.3)

## 2017-11-20 LAB — PROTIME-INR
INR: 1.1 ratio — ABNORMAL HIGH (ref 0.8–1.0)
PROTHROMBIN TIME: 12.7 s (ref 9.6–13.1)

## 2017-11-20 LAB — SEDIMENTATION RATE: Sed Rate: 19 mm/hr (ref 0–30)

## 2017-11-20 NOTE — Assessment & Plan Note (Addendum)
Take sputum photos:  coughing up "black flakes" in mucus CXR Labs Sinus CT if needed

## 2017-11-20 NOTE — Progress Notes (Signed)
Subjective:  Patient ID: Kayla Davis, female    DOB: 1922/11/26  Age: 84 y.o. MRN: [id number]  CC: No chief complaint on file.   HPI BRAYAH URQUILLA presents for UTI, blood in urine. She was treated w/Monurol and Macrobid - per Urology C/o coughing up "black flakes" in mucus - unable to produce here Lives w/son  Outpatient Medications Prior to Visit  Medication Sig Dispense Refill  . ALPHA LIPOIC ACID PO Take 1 tablet by mouth daily.    [name] aspirin 325 MG tablet Take 325 mg by mouth daily.    [name] CRANBERRY PO Take 1 tablet by mouth daily with supper.    . Cyanocobalamin (VITAMIN B-12 PO) Take 1 tablet by mouth at bedtime.    [name] Calcium (STOOL SOFTENER PO) Take 2 capsules by mouth at bedtime.    [name] EVENING PRIMROSE OIL PO Take 1 capsule by mouth daily.    . fexofenadine (ALLEGRA) 30 MG tablet Take 30 mg by mouth 2 (two) times daily.    . hydrochlorothiazide (MICROZIDE) 12.5 MG capsule Take 1 capsule (12.5 mg total) by mouth daily. 90 capsule 3  . ipratropium (ATROVENT) 0.06 % nasal spray Place 2 sprays into both nostrils 3 (three) times daily. 45 mL 1  . Lancets (ONETOUCH ULTRASOFT) lancets Use to check blood sugars once a day Dx E11.9 100 each 3  . levothyroxine (SYNTHROID, LEVOTHROID) 100 MCG tablet Take 1 tablet (100 mcg total) by mouth daily before breakfast. 90 tablet 1  . losartan (COZAAR) 25 MG tablet TAKE 1 TABLET TWICE A DAY 180 tablet 3  . LYRICA 75 MG capsule TAKE 1 CAPSULE TWICE DAILY 180 capsule 3  . Magnesium 250 MG TABS Take 1 tablet daily by mouth.     . Multiple Vitamins-Minerals (EYE VITAMINS) CAPS Take 1 capsule by mouth daily.    [name] (SYSTANE OP) Apply to eye daily.    . polyethylene glycol (MIRALAX / GLYCOLAX) packet Take 17 g by mouth daily as needed for mild constipation.    . simvastatin (ZOCOR) 40 MG tablet Take 1 tablet (40 mg total) by mouth daily at 6 PM. 90 tablet 3  . furosemide (LASIX) 40 MG tablet Take 0.5 tablets  (20 mg total) by mouth daily as needed for fluid or edema. 30 tablet 0  . metoprolol tartrate (LOPRESSOR) 25 MG tablet Take 1 tablet (25 mg total) by mouth 2 (two) times daily. 180 tablet 1   No facility-administered medications prior to visit.     ROS: Review of Systems  Constitutional: Positive for fatigue. Negative for activity change, appetite change, chills and unexpected weight change.  HENT: Negative for congestion, mouth sores and sinus pressure.   Eyes: Negative for visual disturbance.  Respiratory: Positive for cough. Negative for chest tightness.   Gastrointestinal: Negative for abdominal pain and nausea.  Genitourinary: Negative for difficulty urinating, frequency and vaginal pain.  Musculoskeletal: Negative for back pain and gait problem.  Skin: Negative for pallor and rash.  Neurological: Positive for dizziness and weakness. Negative for tremors, numbness and headaches.  Psychiatric/Behavioral: Positive for decreased concentration. Negative for confusion, sleep disturbance and suicidal ideas.    Objective:  BP (!) 150/82 (BP Location: Left Arm, Patient Position: Sitting, Cuff Size: Normal)   Pulse 62   Temp 97.7 F (36.5 C) (Oral)   Ht 5\' 5"  (1.651 m)   Wt 156 lb (70.8 kg)   LMP  (LMP Unknown)   SpO2 94%  BMI 25.96 kg/m   BP Readings from Last 3 Encounters:  11/20/17 (!) 150/82  11/04/17 (!) 155/62  11/02/17 140/62    Wt Readings from Last 3 Encounters:  11/20/17 156 lb (70.8 kg)  11/02/17 161 lb 6.4 oz (73.2 kg)  08/04/17 159 lb (72.1 kg)    Physical Exam  Constitutional: She appears well-developed. No distress.  HENT:  Head: Normocephalic.  Right Ear: External ear normal.  Left Ear: External ear normal.  Nose: Nose normal.  Mouth/Throat: Oropharynx is clear and moist.  Eyes: Pupils are equal, round, and reactive to light. Conjunctivae are normal. Right eye exhibits no discharge. Left eye exhibits no discharge.  Neck: Normal range of motion. Neck  supple. No JVD present. No tracheal deviation present. No thyromegaly present.  Cardiovascular: Normal rate, regular rhythm and normal heart sounds.  Pulmonary/Chest: No stridor. No respiratory distress. She has no wheezes.  Abdominal: Soft. Bowel sounds are normal. She exhibits no distension and no mass. There is no tenderness. There is no rebound and no guarding.  Musculoskeletal: She exhibits no edema or tenderness.  Lymphadenopathy:    She has no cervical adenopathy.  Neurological: She displays normal reflexes. No cranial nerve deficit. She exhibits normal muscle tone. Coordination abnormal.  Skin: No rash noted. No erythema.  Psychiatric: She has a normal mood and affect. Her behavior is normal. Judgment and thought content normal.  walker  Lab Results  Component Value Date   WBC 7.4 03/14/2017   HGB 12.3 03/14/2017   HCT 36.8 03/14/2017   PLT 260 03/14/2017   GLUCOSE 151 (H) 06/17/2017   CHOL 162 12/24/2016   TRIG 97.0 12/24/2016   HDL 80.70 12/24/2016   LDLDIRECT 143.0 10/07/2007   LDLCALC 62 12/24/2016   ALT 19 03/14/2017   AST 24 03/14/2017   NA 133 (L) 06/17/2017   K 4.5 06/17/2017   CL 93 (L) 06/17/2017   CREATININE 0.87 06/17/2017   BUN 21 06/17/2017   CO2 26 06/17/2017   TSH 6.38 (H) 12/24/2016   INR 1.06 07/03/2016   HGBA1C 6.6 (H) 12/24/2016    No results found.  Assessment & Plan:   There are no diagnoses linked to this encounter.   No orders of the defined types were placed in this encounter.    Follow-up: No follow-ups on file.  [name], MD

## 2017-11-20 NOTE — Assessment & Plan Note (Signed)
Toprol 

## 2017-11-20 NOTE — Assessment & Plan Note (Signed)
walker

## 2017-11-20 NOTE — Assessment & Plan Note (Signed)
  On diet  

## 2017-11-20 NOTE — Assessment & Plan Note (Signed)
7/19 macroscopic - treated Labs Dr Pilar Jarvis

## 2017-11-22 ENCOUNTER — Other Ambulatory Visit: Payer: Self-pay | Admitting: Internal Medicine

## 2017-11-22 DIAGNOSIS — R7989 Other specified abnormal findings of blood chemistry: Secondary | ICD-10-CM

## 2017-11-22 MED ORDER — FUROSEMIDE 20 MG PO TABS: 20.0000 mg | ORAL_TABLET | Freq: Every day | ORAL | 11 refills | Status: DC

## 2017-11-22 MED ORDER — POTASSIUM CHLORIDE ER 10 MEQ PO TBCR: 10.0000 meq | EXTENDED_RELEASE_TABLET | Freq: Every day | ORAL | 11 refills | Status: DC

## 2017-11-23 NOTE — Addendum Note (Signed)
Addended by: Cresenciano Lick on: 11/23/2017 02:05 PM   Modules accepted: Orders

## 2017-12-02 DIAGNOSIS — N3000 Acute cystitis without hematuria: Secondary | ICD-10-CM | POA: Diagnosis not present

## 2017-12-02 DIAGNOSIS — R310 Gross hematuria: Secondary | ICD-10-CM | POA: Diagnosis not present

## 2017-12-03 ENCOUNTER — Other Ambulatory Visit (INDEPENDENT_AMBULATORY_CARE_PROVIDER_SITE_OTHER): Payer: MEDICARE

## 2017-12-03 DIAGNOSIS — R7989 Other specified abnormal findings of blood chemistry: Secondary | ICD-10-CM | POA: Diagnosis not present

## 2017-12-03 LAB — BASIC METABOLIC PANEL
BUN: 19 mg/dL (ref 6–23)
CALCIUM: 9.3 mg/dL (ref 8.4–10.5)
CO2: 30 mEq/L (ref 19–32)
CREATININE: 1.17 mg/dL (ref 0.40–1.20)
Chloride: 95 mEq/L — ABNORMAL LOW (ref 96–112)
GFR: 45.88 mL/min — ABNORMAL LOW (ref >60.00)
Glucose, Bld: 111 mg/dL — ABNORMAL HIGH (ref 70–99)
Potassium: 4 mEq/L (ref 3.5–5.1)
SODIUM: 132 meq/L — AB (ref 135–145)

## 2017-12-07 ENCOUNTER — Telehealth: Payer: Self-pay | Admitting: Internal Medicine

## 2017-12-07 NOTE — Telephone Encounter (Signed)
Copied from [place] [id number]. Topic: Quick Communication - See Telephone Encounter >> Dec 07, 2017  9:39 AM Gardiner Ramus wrote: CRM for notification. See Telephone encounter for: 12/07/17. Pt daughter called and stated that she would like mothers lab results and would like to know if they still needed to come in on 12/08/17. Please advise

## 2017-12-07 NOTE — Telephone Encounter (Signed)
Please advise 

## 2017-12-07 NOTE — Telephone Encounter (Signed)
Per lab note:  Kayla Davis, Please inform the patient's dtr that all labs are stable, except for a low sodium. D/c HCTZ. Start Lasix and K dur qd. Fluid restriction. BMET in 10 d. CXR was ok. Thanks, AP   RTC 1 mo   Thx

## 2017-12-07 NOTE — Telephone Encounter (Signed)
Patient calling to receive lab results. Please advise.

## 2017-12-08 ENCOUNTER — Ambulatory Visit: Payer: MEDICARE | Admitting: Internal Medicine

## 2017-12-08 NOTE — Telephone Encounter (Signed)
Talked with edie --patient has not taken lasix, she is hesitant to take per cardiology instructions, cardio dtr only wants her to take for one or two days at a time and only when very necessary---per dr Alain Marion, ok to not take lasix, take potassium and use lasix if needed, can get cardiology advisement for lasix if needed, keep all follow up appts with dr Alain Marion, edie/patient's daughter advised

## 2017-12-10 ENCOUNTER — Other Ambulatory Visit: Payer: Self-pay

## 2017-12-10 MED ORDER — LEVOTHYROXINE SODIUM 100 MCG PO TABS: 100.0000 ug | ORAL_TABLET | Freq: Every day | ORAL | 1 refills | Status: DC

## 2017-12-17 DIAGNOSIS — N3000 Acute cystitis without hematuria: Secondary | ICD-10-CM | POA: Diagnosis not present

## 2017-12-17 DIAGNOSIS — R310 Gross hematuria: Secondary | ICD-10-CM | POA: Diagnosis not present

## 2018-01-01 ENCOUNTER — Encounter: Payer: Self-pay | Admitting: Internal Medicine

## 2018-01-01 ENCOUNTER — Ambulatory Visit (INDEPENDENT_AMBULATORY_CARE_PROVIDER_SITE_OTHER): Payer: MEDICARE | Admitting: Internal Medicine

## 2018-01-01 VITALS — BP 124/70 | HR 67 | Temp 97.7°F | Ht 65.0 in | Wt 156.0 lb

## 2018-01-01 DIAGNOSIS — I6521 Occlusion and stenosis of right carotid artery: Secondary | ICD-10-CM | POA: Diagnosis not present

## 2018-01-01 DIAGNOSIS — I10 Essential (primary) hypertension: Secondary | ICD-10-CM | POA: Diagnosis not present

## 2018-01-01 DIAGNOSIS — E034 Atrophy of thyroid (acquired): Secondary | ICD-10-CM | POA: Diagnosis not present

## 2018-01-01 DIAGNOSIS — E871 Hypo-osmolality and hyponatremia: Secondary | ICD-10-CM | POA: Diagnosis not present

## 2018-01-01 DIAGNOSIS — Z23 Encounter for immunization: Secondary | ICD-10-CM | POA: Diagnosis not present

## 2018-01-01 DIAGNOSIS — E119 Type 2 diabetes mellitus without complications: Secondary | ICD-10-CM | POA: Diagnosis not present

## 2018-01-01 MED ORDER — SIMVASTATIN 40 MG PO TABS: 40.0000 mg | ORAL_TABLET | Freq: Every day | ORAL | 3 refills | Status: DC

## 2018-01-01 MED ORDER — ZOSTER VAC RECOMB ADJUVANTED 50 MCG/0.5ML IM SUSR: 0.5000 mL | Freq: Once | INTRAMUSCULAR | 1 refills | Status: AC

## 2018-01-01 NOTE — Assessment & Plan Note (Signed)
Levothroid 

## 2018-01-01 NOTE — Progress Notes (Signed)
Subjective:  Patient ID: Kayla Davis, female    DOB: [birth year]  Age: 84 y.o. MRN: [id number]  CC: No chief complaint on file.   HPI Kayla Davis presents for dyslipidemia, OA, HTN, low sodium hypothyroidism f/u  Outpatient Medications Prior to Visit  Medication Sig Dispense Refill  . ALPHA LIPOIC ACID PO Take 1 tablet by mouth daily.    [name] aspirin 325 MG tablet Take 325 mg by mouth daily.    [name] CRANBERRY PO Take 1 tablet by mouth daily with supper.    . Cyanocobalamin (VITAMIN B-12 PO) Take 1 tablet by mouth at bedtime.    [name] Calcium (STOOL SOFTENER PO) Take 2 capsules by mouth at bedtime.    [name] EVENING PRIMROSE OIL PO Take 1 capsule by mouth daily.    . fexofenadine (ALLEGRA) 30 MG tablet Take 30 mg by mouth 2 (two) times daily.    [name] ipratropium (ATROVENT) 0.06 % nasal spray Place 2 sprays into both nostrils 3 (three) times daily. 45 mL 1  . Lancets (ONETOUCH ULTRASOFT) lancets Use to check blood sugars once a day Dx E11.9 100 each 3  . levothyroxine (SYNTHROID, LEVOTHROID) 100 MCG tablet Take 1 tablet (100 mcg total) by mouth daily before breakfast. 90 tablet 1  . losartan (COZAAR) 25 MG tablet TAKE 1 TABLET TWICE A DAY 180 tablet 3  . LYRICA 75 MG capsule TAKE 1 CAPSULE TWICE DAILY 180 capsule 3  . Magnesium 250 MG TABS Take 1 tablet daily by mouth.     . Multiple Vitamins-Minerals (EYE VITAMINS) CAPS Take 1 capsule by mouth daily.    [name] (SYSTANE OP) Apply to eye daily.    . polyethylene glycol (MIRALAX / GLYCOLAX) packet Take 17 g by mouth daily as needed for mild constipation.    . potassium chloride (K-DUR) 10 MEQ tablet Take 1 tablet (10 mEq total) by mouth daily. 30 tablet 11  . simvastatin (ZOCOR) 40 MG tablet Take 1 tablet (40 mg total) by mouth daily at 6 PM. 90 tablet 3  . metoprolol tartrate (LOPRESSOR) 25 MG tablet Take 1 tablet (25 mg total) by mouth 2 (two) times daily. 180 tablet 1  . furosemide (LASIX) 20 MG tablet Take  1 tablet (20 mg total) by mouth daily. 30 tablet 11   No facility-administered medications prior to visit.     ROS: Review of Systems  Constitutional: Negative for activity change, appetite change, chills, fatigue and unexpected weight change.  HENT: Positive for hearing loss. Negative for congestion, mouth sores and sinus pressure.   Eyes: Negative for visual disturbance.  Respiratory: Negative for cough and chest tightness.   Gastrointestinal: Negative for abdominal pain and nausea.  Genitourinary: Negative for difficulty urinating, frequency and vaginal pain.  Musculoskeletal: Positive for gait problem. Negative for back pain.  Skin: Negative for pallor and rash.  Neurological: Negative for dizziness, tremors, weakness, numbness and headaches.  Psychiatric/Behavioral: Negative for confusion and sleep disturbance.    Objective:  BP 124/70 (BP Location: Left Arm, Patient Position: Sitting, Cuff Size: Normal)   Pulse 67   Temp 97.7 F (36.5 C) (Oral)   Ht 5\' 5"  (1.651 m)   Wt 156 lb (70.8 kg)   LMP  (LMP Unknown)   SpO2 97%   BMI 25.96 kg/m   BP Readings from Last 3 Encounters:  01/01/18 124/70  11/20/17 (!) 150/82  11/04/17 (!) 155/62    Wt Readings from Last 3 Encounters:  01/01/18  156 lb (70.8 kg)  11/20/17 156 lb (70.8 kg)  11/02/17 161 lb 6.4 oz (73.2 kg)    Physical Exam  Constitutional: She appears well-developed. No distress.  HENT:  Head: Normocephalic.  Right Ear: External ear normal.  Left Ear: External ear normal.  Nose: Nose normal.  Mouth/Throat: Oropharynx is clear and moist.  Eyes: Pupils are equal, round, and reactive to light. Conjunctivae are normal. Right eye exhibits no discharge. Left eye exhibits no discharge.  Neck: Normal range of motion. Neck supple. No JVD present. No tracheal deviation present. No thyromegaly present.  Cardiovascular: Normal rate, regular rhythm and normal heart sounds.  Pulmonary/Chest: No stridor. No respiratory  distress. She has no wheezes.  Abdominal: Soft. Bowel sounds are normal. She exhibits no distension and no mass. There is no tenderness. There is no rebound and no guarding.  Musculoskeletal: She exhibits no edema or tenderness.  Lymphadenopathy:    She has no cervical adenopathy.  Neurological: She displays normal reflexes. No cranial nerve deficit. She exhibits normal muscle tone. Coordination abnormal.  Skin: No rash noted. No erythema.  Psychiatric: She has a normal mood and affect. Her behavior is normal. Judgment and thought content normal.  walker  Lab Results  Component Value Date   WBC 6.1 11/20/2017   HGB 12.1 11/20/2017   HCT 35.3 (L) 11/20/2017   PLT 258.0 11/20/2017   GLUCOSE 111 (H) 12/03/2017   CHOL 162 12/24/2016   TRIG 97.0 12/24/2016   HDL 80.70 12/24/2016   LDLDIRECT 143.0 10/07/2007   LDLCALC 62 12/24/2016   ALT 12 11/20/2017   AST 15 11/20/2017   NA 132 (L) 12/03/2017   K 4.0 12/03/2017   CL 95 (L) 12/03/2017   CREATININE 1.17 12/03/2017   BUN 19 12/03/2017   CO2 30 12/03/2017   TSH 6.38 (H) 12/24/2016   INR 1.1 (H) 11/20/2017   HGBA1C 6.6 (H) 12/24/2016    Dg Chest 2 View  Result Date: 11/21/2017 CLINICAL DATA:  Cough. EXAM: CHEST - 2 VIEW COMPARISON:  08/16/2015 FINDINGS: Probable mild increase in heart size since the prior study without significant enlargement. Stable appearance of dual-chamber pacemaker. Stable tortuosity of the thoracic aorta. There is no evidence of pulmonary edema, consolidation, pneumothorax, nodule or pleural fluid. The bony thorax shows stable degenerative disease of the thoracic spine without visible fracture or bony lesion. IMPRESSION: No acute findings. The heart likely has enlarged slightly since 2017 but remains without significant enlargement. Pacemaker positioning appears stable. Electronically Signed   By: Aletta Edouard M.D.   On: 11/21/2017 10:23    Assessment & Plan:   There are no diagnoses linked to this  encounter.   No orders of the defined types were placed in this encounter.    Follow-up: No follow-ups on file.  [name], MD

## 2018-01-01 NOTE — Assessment & Plan Note (Signed)
Diet controlled.  

## 2018-01-01 NOTE — Assessment & Plan Note (Signed)
Cut back on fluids

## 2018-01-01 NOTE — Assessment & Plan Note (Signed)
Losartan bid  HCT 12.5 mg at hs Metoprolol

## 2018-01-18 ENCOUNTER — Ambulatory Visit (INDEPENDENT_AMBULATORY_CARE_PROVIDER_SITE_OTHER): Payer: MEDICARE | Admitting: *Deleted

## 2018-01-18 ENCOUNTER — Telehealth: Payer: Self-pay

## 2018-01-18 DIAGNOSIS — I495 Sick sinus syndrome: Secondary | ICD-10-CM | POA: Diagnosis not present

## 2018-01-18 NOTE — Telephone Encounter (Signed)
Spoke with pt and reminded pt of remote transmission that is due today. Pt verbalized understanding.   

## 2018-01-20 NOTE — Progress Notes (Signed)
Remote pacemaker transmission.   

## 2018-01-27 ENCOUNTER — Encounter: Payer: Self-pay | Admitting: Cardiology

## 2018-01-28 DIAGNOSIS — R310 Gross hematuria: Secondary | ICD-10-CM | POA: Diagnosis not present

## 2018-01-29 ENCOUNTER — Encounter: Payer: Self-pay | Admitting: Family

## 2018-01-29 ENCOUNTER — Ambulatory Visit (INDEPENDENT_AMBULATORY_CARE_PROVIDER_SITE_OTHER): Payer: MEDICARE | Admitting: Family

## 2018-01-29 DIAGNOSIS — R918 Other nonspecific abnormal finding of lung field: Secondary | ICD-10-CM

## 2018-01-29 DIAGNOSIS — R05 Cough: Secondary | ICD-10-CM

## 2018-01-29 DIAGNOSIS — R059 Cough, unspecified: Secondary | ICD-10-CM

## 2018-01-29 MED ORDER — FLUTICASONE PROPIONATE 50 MCG/ACT NA SUSP: 2.0000 | Freq: Every day | NASAL | 6 refills | Status: DC

## 2018-01-29 MED ORDER — AZITHROMYCIN 250 MG PO TABS: ORAL_TABLET | ORAL | 0 refills | Status: DC

## 2018-01-29 MED ORDER — BENZONATATE 100 MG PO CAPS: 100.0000 mg | ORAL_CAPSULE | Freq: Three times a day (TID) | ORAL | 0 refills | Status: DC | PRN

## 2018-01-29 NOTE — Progress Notes (Signed)
Kayla Davis is a 84 y.o. female with the following history as recorded in [employer]:  Patient Active Problem List   Diagnosis Date Noted  . Hematuria 11/20/2017  . Cough 11/20/2017  . Colon cancer screening 08/04/2017  . Abdominal pain 03/13/2017  . Amaurosis 07/03/2016  . Diet-controlled type 2 diabetes mellitus ([place]) 02/29/2016  . Vertigo 12/11/2015  . Gait disorder 06/27/2015  . OA (osteoarthritis) of hip 03/23/2015  . Preop exam for internal medicine 03/04/2015  . Shortness of breath 02/13/2015  . Rhinitis, atrophic 11/22/2014  . Well adult exam 04/21/2014  . Amaurosis fugax 03/31/2014  . History of CEA (carotid endarterectomy) 03/31/2014  . HLD (hyperlipidemia) 03/31/2014  . Carotid stenosis 02/07/2014  . Cerebral infarction ([place]) 01/16/2014  . Dyslipidemia 01/16/2014  . Weakness generalized 04/30/2013  . CAP (community acquired pneumonia) 04/29/2013  . Edema 03/22/2013  . Urinary incontinence in female 03/22/2013  . Constipation 10/01/2012  . UTI (urinary tract infection) 10/01/2012  . Diarrhea 08/26/2012  . Cerumen impaction 12/11/2011  . Pacemaker-Medtronic 07/30/2010  . SINUS BRADYCARDIA 05/17/2010  . DYSURIA 01/29/2010  . Aortic valve disorders 07/31/2009  . Venous (peripheral) insufficiency 07/31/2009  . Hyponatremia 07/01/2009  . LEG CRAMPS 06/12/2009  . LEG PAIN, BILATERAL 02/01/2009  . Backache 08/24/2008  . Anemia 06/21/2008  . DEGENERATIVE JOINT DISEASE 02/19/2008  . CHEST WALL PAIN, HX OF 02/19/2008  . IRRITABLE BOWEL SYNDROME 12/28/2007  . DIABETES MELLITUS, BORDERLINE 11/06/2007  . GASTRITIS 07/08/2007  . POSTHERPETIC NEURALGIA 07/01/2007  . Herpes zoster without mention of complication 08/65/7846  . Hypothyroidism 06/08/2007  . HYPERLIPIDEMIA 06/08/2007  . Anxiety state 06/08/2007  . Hereditary and idiopathic peripheral neuropathy 06/08/2007  . HEARING LOSS 06/08/2007  . Essential hypertension 06/08/2007  . Transient cerebral ischemia  06/08/2007  . BRONCHITIS, RECURRENT 06/08/2007  . DIVERTICULOSIS OF COLON 06/08/2007  . Osteoporosis 06/08/2007    Current Outpatient Medications  Medication Sig Dispense Refill  . ALPHA LIPOIC ACID PO Take 1 tablet by mouth daily.    [name] aspirin 325 MG tablet Take 325 mg by mouth daily.    [name] CRANBERRY PO Take 1 tablet by mouth daily with supper.    . Cyanocobalamin (VITAMIN B-12 PO) Take 1 tablet by mouth at bedtime.    [name] Calcium (STOOL SOFTENER PO) Take 2 capsules by mouth at bedtime.    [name] ESTRACE VAGINAL 0.1 MG/GM vaginal cream     . EVENING PRIMROSE OIL PO Take 1 capsule by mouth daily.    . fexofenadine (ALLEGRA) 30 MG tablet Take 30 mg by mouth 2 (two) times daily.    [name] ipratropium (ATROVENT) 0.06 % nasal spray Place 2 sprays into both nostrils 3 (three) times daily. 45 mL 1  . Lancets (ONETOUCH ULTRASOFT) lancets Use to check blood sugars once a day Dx E11.9 100 each 3  . levothyroxine (SYNTHROID, LEVOTHROID) 100 MCG tablet Take 1 tablet (100 mcg total) by mouth daily before breakfast. 90 tablet 1  . losartan (COZAAR) 25 MG tablet TAKE 1 TABLET TWICE A DAY 180 tablet 3  . LYRICA 75 MG capsule TAKE 1 CAPSULE TWICE DAILY 180 capsule 3  . Magnesium 250 MG TABS Take 1 tablet daily by mouth.     . Multiple Vitamins-Minerals (EYE VITAMINS) CAPS Take 1 capsule by mouth daily.    [name] (SYSTANE OP) Apply to eye daily.    . polyethylene glycol (MIRALAX / GLYCOLAX) packet Take 17 g by mouth daily as needed for mild  constipation.    . potassium chloride (K-DUR) 10 MEQ tablet Take 1 tablet (10 mEq total) by mouth daily. 30 tablet 11  . simvastatin (ZOCOR) 40 MG tablet Take 1 tablet (40 mg total) by mouth daily at 6 PM. 90 tablet 3  . azithromycin (ZITHROMAX) 250 MG tablet Take 2 tablets ( total 500 mg) PO on day 1, then take 1 tablet ( total 250 mg) by mouth q24h x 4 days. 6 tablet 0  . benzonatate (TESSALON) 100 MG capsule Take 1 capsule (100 mg total) by  mouth 3 (three) times daily as needed. 30 capsule 0  . fluticasone (FLONASE) 50 MCG/ACT nasal spray Place 2 sprays into both nostrils daily. 16 g 6  . metoprolol tartrate (LOPRESSOR) 25 MG tablet Take 1 tablet (25 mg total) by mouth 2 (two) times daily. 180 tablet 1   No current facility-administered medications for this visit.     Allergies: Penicillins; Alprazolam; Bactrim [sulfamethoxazole-trimethoprim]; Ceclor [cefaclor]; Cephalexin; Ciprofloxacin; Clindamycin/lincomycin; Hydrocodone; Noroxin [norfloxacin]; Sulfonamide derivatives; Tetracyclines & related; and Tramadol  Past Medical History:  Diagnosis Date  . Anemia, unspecified   . Anxiety state, unspecified   . Aortic valve disorders   . Bradycardia    s/p PPM  . Bursitis   . Complication of anesthesia    has had difficulty awakening in past; pt states did well with the anesthesia given during her carotid surgery   . Diabetes mellitus without complication ([place])   . Diverticulosis of colon (without mention of hemorrhage)   . Herpes zoster with other nervous system complications(053.19)   . Hyperlipidemia   . Hypertension   . Hypothyroidism   . Irritable bowel syndrome   . Menopause   . Osteoarthrosis, unspecified whether generalized or localized, unspecified site   . Peripheral vascular disease ([place])   . TIA (transient ischemic attack)   . Unspecified hearing loss   . Varicose veins     Past Surgical History:  Procedure Laterality Date  . APPENDECTOMY    . CATARACT EXTRACTION, BILATERAL  end of June 2013  . CHOLECYSTECTOMY    . DILATION AND CURETTAGE OF UTERUS    . ENDARTERECTOMY Right 01/20/2014   Procedure: RIGHT CAROTID ARTERY ENDARTERECTOMY WITH DACRON PATCH ANGIOPLASTY;  Surgeon: Mal Misty, MD;  Location: [facility];  Service: Vascular;  Laterality: Right;  . PACEMAKER INSERTION     MDT dual chamber PPM implanted by Dr [name] for sinus node dysfunction   . TONSILLECTOMY    . TOTAL HIP ARTHROPLASTY Left 03/23/2015    Procedure: TOTAL HIP ARTHROPLASTY ANTERIOR APPROACH;  Surgeon: Gaynelle Arabian, MD;  Location: [facility];  Service: Orthopedics;  Laterality: Left;  [name] VAGINAL HYSTERECTOMY      Family History  Problem Relation Age of Onset  . Heart disease Mother   . Heart disease Father   . Diabetes Father   . Cancer Paternal Grandmother     Social History   Tobacco Use  . Smoking status: Never Smoker  . Smokeless tobacco: Never Used  Substance Use Topics  . Alcohol use: No    Alcohol/week: 0.0 standard drinks    Subjective: Patient presents with concerns for 3 day history of cough/ congestion; + low-grade fever; daughter provides history- patient does not have working hearing aids today and cannot help provide history; + hoarseness; + nasal congestion;    Objective:  Vitals:   01/29/18 0840  BP: 126/68  Pulse: 76  Temp: 97.7 F (36.5 C)  TempSrc: Oral  SpO2: 97%  Weight: 159 lb 1.3 oz (72.2 kg)  Height: 5\' 5"  (1.651 m)    General: Well developed, well nourished, in no acute distress  Skin : Warm and dry.  Head: Normocephalic and atraumatic  Eyes: Sclera and conjunctiva clear; pupils round and reactive to light; extraocular movements intact  Ears: External normal; canals clear; tympanic membranes congested Oropharynx: Pink, supple. No suspicious lesions  Neck: Supple without thyromegaly, adenopathy  Lungs: Respirations unlabored; clear to auscultation bilaterally without wheeze, rales, rhonchi  CVS exam: normal rate and regular rhythm.  Neurologic: Alert and oriented; speech intact; face symmetrical; moves all extremities well; CNII-XII intact without focal deficit   Assessment:  1. Cough   2. Lung infiltrate     Plan:  Rx for Z-pak #1 take as directed, Flonase and Tessalon Perles; increase fluids, rest and follow-up worse, no better.   No follow-ups on file.  No orders of the defined types were placed in this encounter.   Requested Prescriptions   Signed Prescriptions Disp  Refills  . azithromycin (ZITHROMAX) 250 MG tablet 6 tablet 0    Sig: Take 2 tablets ( total 500 mg) PO on day 1, then take 1 tablet ( total 250 mg) by mouth q24h x 4 days.  . fluticasone (FLONASE) 50 MCG/ACT nasal spray 16 g 6    Sig: Place 2 sprays into both nostrils daily.  . benzonatate (TESSALON) 100 MG capsule 30 capsule 0    Sig: Take 1 capsule (100 mg total) by mouth 3 (three) times daily as needed.

## 2018-02-11 LAB — CUP PACEART REMOTE DEVICE CHECK
Battery Impedance: 794 Ohm
Battery Remaining Longevity: 70 mo
Brady Statistic AP VP Percent: 2 %
Brady Statistic AP VS Percent: 94 %
Brady Statistic AS VP Percent: 0 %
Brady Statistic AS VS Percent: 4 %
Implantable Lead Implant Date: 20111031
Implantable Lead Implant Date: 20111031
Implantable Lead Location: 753859
Implantable Lead Model: 4469
Implantable Lead Model: 4470
Implantable Lead Serial Number: 548557
Implantable Lead Serial Number: 686076
Implantable Pulse Generator Implant Date: 20111031
Lead Channel Impedance Value: 486 Ohm
Lead Channel Impedance Value: 547 Ohm
Lead Channel Pacing Threshold Amplitude: 0.5 V
Lead Channel Pacing Threshold Amplitude: 0.75 V
Lead Channel Pacing Threshold Pulse Width: 0.4 ms
Lead Channel Setting Pacing Amplitude: 2 V
Lead Channel Setting Pacing Amplitude: 2.5 V
Lead Channel Setting Sensing Sensitivity: 4 mV
MDC IDC LEAD LOCATION: 753860
MDC IDC MSMT BATTERY VOLTAGE: 2.78 V
MDC IDC MSMT LEADCHNL RV PACING THRESHOLD PULSEWIDTH: 0.4 ms
MDC IDC SESS DTM: 20191008005615
MDC IDC SET LEADCHNL RV PACING PULSEWIDTH: 0.4 ms

## 2018-02-24 ENCOUNTER — Encounter: Payer: MEDICARE | Admitting: Nurse Practitioner

## 2018-02-25 ENCOUNTER — Ambulatory Visit (INDEPENDENT_AMBULATORY_CARE_PROVIDER_SITE_OTHER): Payer: MEDICARE | Admitting: *Deleted

## 2018-02-25 VITALS — BP 128/74 | HR 53 | Temp 97.6°F | Resp 18 | Ht 65.0 in | Wt 160.0 lb

## 2018-02-25 DIAGNOSIS — E119 Type 2 diabetes mellitus without complications: Secondary | ICD-10-CM

## 2018-02-25 DIAGNOSIS — E034 Atrophy of thyroid (acquired): Secondary | ICD-10-CM

## 2018-02-25 DIAGNOSIS — Z0000 Encounter for general adult medical examination without abnormal findings: Secondary | ICD-10-CM

## 2018-02-25 NOTE — Progress Notes (Addendum)
Subjective:   Kayla Davis is a 84 y.o. female who presents for Medicare Annual (Subsequent) preventive examination.  Review of Systems:  No ROS.  Medicare Wellness Visit. Additional risk factors are reflected in the social history.  Cardiac Risk Factors include: advanced age (>5men, >67 women);diabetes mellitus;dyslipidemia;hypertension Sleep patterns: feels rested on waking, gets up 2 times nightly to void and sleeps 7-8 hours nightly.    Home Safety/Smoke Alarms: Feels safe in home. Smoke alarms in place.  Living environment; residence and Firearm Safety: 1-story house/ trailer, equipment: Walkers, Type: Civil Service fast streamer, Type: Tub Surveyor, quantity, no firearms. Lives alone, no needs for DME, good support system Seat Belt Safety/Bike Helmet: Wears seat belt.      Objective:     Vitals: BP 128/74   Pulse (!) 53   Temp 97.6 F (36.4 C)   Resp 18   Ht 5\' 5"  (1.651 m)   Wt 160 lb (72.6 kg)   LMP  (LMP Unknown)   SpO2 95%   BMI 26.63 kg/m   Body mass index is 26.63 kg/m.  Advanced Directives 02/25/2018 03/14/2017 03/04/2017 10/02/2016 09/11/2016 07/15/2016 07/03/2016  Does Patient Have a Medical Advance Directive? Yes Yes Yes Yes Yes No No  Type of Paramedic of [place];Living will Healthcare Power of [place];Living will - -  Does patient want to make changes to medical advance directive? Yes (ED - Information included in AVS) - - - - - -  Copy of [place] in Chart? No - copy requested No - copy requested - - No - copy requested - -  Would patient like information on creating a medical advance directive? - - - - - - No - Patient declined  Pre-existing out of facility DNR order (yellow form or pink MOST form) - - - - - - -    Tobacco Social History   Tobacco Use  Smoking Status Never Smoker  Smokeless Tobacco Never Used     Counseling given: Not  Answered  Past Medical History:  Diagnosis Date  . Anemia, unspecified   . Anxiety state, unspecified   . Aortic valve disorders   . Bradycardia    s/p PPM  . Bursitis   . Complication of anesthesia    has had difficulty awakening in past; pt states did well with the anesthesia given during her carotid surgery   . Diabetes mellitus without complication ([place])   . Diverticulosis of colon (without mention of hemorrhage)   . Herpes zoster with other nervous system complications(053.19)   . Hyperlipidemia   . Hypertension   . Hypothyroidism   . Irritable bowel syndrome   . Menopause   . Osteoarthrosis, unspecified whether generalized or localized, unspecified site   . Peripheral vascular disease ([place])   . TIA (transient ischemic attack)   . Unspecified hearing loss   . Varicose veins    Past Surgical History:  Procedure Laterality Date  . APPENDECTOMY    . CATARACT EXTRACTION, BILATERAL  end of June 2013  . CHOLECYSTECTOMY    . DILATION AND CURETTAGE OF UTERUS    . ENDARTERECTOMY Right 01/20/2014   Procedure: RIGHT CAROTID ARTERY ENDARTERECTOMY WITH DACRON PATCH ANGIOPLASTY;  Surgeon: Mal Misty, MD;  Location: [facility];  Service: Vascular;  Laterality: Right;  . PACEMAKER INSERTION     MDT dual chamber PPM implanted by Dr [name] for sinus node dysfunction   .  TONSILLECTOMY    . TOTAL HIP ARTHROPLASTY Left 03/23/2015   Procedure: TOTAL HIP ARTHROPLASTY ANTERIOR APPROACH;  Surgeon: Gaynelle Arabian, MD;  Location: [facility];  Service: Orthopedics;  Laterality: Left;  [name] VAGINAL HYSTERECTOMY     Family History  Problem Relation Age of Onset  . Heart disease Mother   . Heart disease Father   . Diabetes Father   . Cancer Paternal Grandmother    Social History   Socioeconomic History  . Marital status: Widowed    Spouse name: Gypsy Balsam.  (deceased)  . Number of children: 4  . Years of education: COLLEGE  . Highest education level: Not on file  Occupational History  . Occupation:  Retired    Fish farm manager: RETIRED  Social Needs  . Financial resource strain: Not hard at all  . Food insecurity:    Worry: Never true    Inability: Never true  . Transportation needs:    Medical: No    Non-medical: No  Tobacco Use  . Smoking status: Never Smoker  . Smokeless tobacco: Never Used  Substance and Sexual Activity  . Alcohol use: No    Alcohol/week: 0.0 standard drinks  . Drug use: No  . Sexual activity: Not Currently  Lifestyle  . Physical activity:    Days per week: 3 days    Minutes per session: 30 min  . Stress: Not at all  Relationships  . Social connections:    Talks on phone: More than three times a week    Gets together: More than three times a week    Attends religious service: 1 to 4 times per year    Active member of club or organization: Yes    Attends meetings of clubs or organizations: 1 to 4 times per year    Relationship status: Widowed  Other Topics Concern  . Not on file  Social History Narrative   Patient is widowed with 4 children. Son stays with patient at night, 08/11/16.   Patient is right handed.   Patient has college education.   Patient drinks 2 cups daily.    Outpatient Encounter Medications as of 02/25/2018  Medication Sig  . ALPHA LIPOIC ACID PO Take 1 tablet by mouth daily.  [name] aspirin 325 MG tablet Take 325 mg by mouth daily.  [name] CRANBERRY PO Take 1 tablet by mouth daily with supper.  . Cyanocobalamin (VITAMIN B-12 PO) Take 1 tablet by mouth at bedtime.  [name] Calcium (STOOL SOFTENER PO) Take 2 capsules by mouth at bedtime.  [name] ESTRACE VAGINAL 0.1 MG/GM vaginal cream   . EVENING PRIMROSE OIL PO Take 1 capsule by mouth daily.  . fexofenadine (ALLEGRA) 30 MG tablet Take 30 mg by mouth 2 (two) times daily.  . fluticasone (FLONASE) 50 MCG/ACT nasal spray Place 2 sprays into both nostrils daily.  [name] ipratropium (ATROVENT) 0.06 % nasal spray Place 2 sprays into both nostrils 3 (three) times daily.  . Lancets (ONETOUCH ULTRASOFT)  lancets Use to check blood sugars once a day Dx E11.9  . levothyroxine (SYNTHROID, LEVOTHROID) 100 MCG tablet Take 1 tablet (100 mcg total) by mouth daily before breakfast.  . losartan (COZAAR) 25 MG tablet TAKE 1 TABLET TWICE A DAY  . LYRICA 75 MG capsule TAKE 1 CAPSULE TWICE DAILY  . Magnesium 250 MG TABS Take 1 tablet daily by mouth.   . Multiple Vitamins-Minerals (EYE VITAMINS) CAPS Take 1 capsule by mouth daily.  [name] (SYSTANE OP) Apply to eye  daily.  . polyethylene glycol (MIRALAX / GLYCOLAX) packet Take 17 g by mouth daily as needed for mild constipation.  . potassium chloride (K-DUR) 10 MEQ tablet Take 1 tablet (10 mEq total) by mouth daily.  . simvastatin (ZOCOR) 40 MG tablet Take 1 tablet (40 mg total) by mouth daily at 6 PM.  . metoprolol tartrate (LOPRESSOR) 25 MG tablet Take 1 tablet (25 mg total) by mouth 2 (two) times daily.  . [DISCONTINUED] azithromycin (ZITHROMAX) 250 MG tablet Take 2 tablets ( total 500 mg) PO on day 1, then take 1 tablet ( total 250 mg) by mouth q24h x 4 days. (Patient not taking: Reported on 02/25/2018)  . [DISCONTINUED] benzonatate (TESSALON) 100 MG capsule Take 1 capsule (100 mg total) by mouth 3 (three) times daily as needed. (Patient not taking: Reported on 02/25/2018)  . [DISCONTINUED] ipratropium (ATROVENT) 0.06 % nasal spray Place 2 sprays into both nostrils 3 (three) times daily. (Patient not taking: Reported on 02/25/2018)   No facility-administered encounter medications on file as of 02/25/2018.     Activities of Daily Living In your present state of health, do you have any difficulty performing the following activities: 02/25/2018  Hearing? N  Vision? N  Difficulty concentrating or making decisions? N  Walking or climbing stairs? N  Dressing or bathing? N  Doing errands, shopping? N  Preparing Food and eating ? N  Using the Toilet? N  In the past six months, have you accidently leaked urine? N  Do you have  problems with loss of bowel control? N  Managing your Medications? N  Managing your Finances? N  Housekeeping or managing your Housekeeping? N  Some recent data might be hidden    Patient Care Team: Plotnikov, Evie Lacks, MD as PCP - General (Internal Medicine) Shon Hough, MD as Consulting Physician (Ophthalmology) Thompson Grayer, MD as Consulting Physician (Cardiology) Suella Broad, MD as Consulting Physician (Physical Medicine and Rehabilitation) Gaynelle Arabian, MD as Consulting Physician (Orthopedic Surgery) Nickie Retort, MD as Consulting Physician (Urology)    Assessment:   This is a routine wellness examination for Lilia. Physical assessment deferred to PCP.   Exercise Activities and Dietary recommendations Current Exercise Habits: Home exercise routine, Time (Minutes): 30, Frequency (Times/Week): 5, Weekly Exercise (Minutes/Week): 150, Intensity: Mild, Exercise limited by: orthopedic condition(s)  Diet (meal preparation, eat out, water intake, caffeinated beverages, dairy products, fruits and vegetables): in general, a "healthy" diet  , well balanced   Reviewed heart healthy and diabetic diet. Encouraged patient to increase daily water and healthy fluid intake.   Goals      Patient Stated   . patient (pt-stated)     Wants to live to see 50 and 82 year old grow up. Keep exercising; keep moving; keep busy      Other   . Continue to do my  normal activities, enjoy family and church       Fall Risk Fall Risk  02/25/2018 01/01/2018 09/11/2016 08/11/2016 07/03/2015  Falls in the past year? 0 No No No No  Risk for fall due to : Impaired balance/gait;Impaired mobility - - - -    Depression Screen PHQ 2/9 Scores 02/25/2018 01/01/2018 09/11/2016 07/03/2015  PHQ - 2 Score 0 0 0 0  PHQ- 9 Score 2 - - -     Cognitive Function MMSE - Mini Mental State Exam 02/25/2018 09/11/2016 07/03/2015  Not completed: - - (No Data)  Orientation to time 5 5 -  Orientation to  Place  5 5 -  Registration 3 3 -  Attention/ Calculation 5 3 -  Recall 2 2 -  Language- name 2 objects 2 2 -  Language- repeat 1 1 -  Language- follow 3 step command 3 3 -  Language- read & follow direction 1 1 -  Write a sentence 1 1 -  Copy design 1 1 -  Total score 29 27 -        Immunization History  Administered Date(s) Administered  . Influenza Split 01/13/2011, 12/30/2011, 02/01/2013  . Influenza Whole 12/21/2008, 01/12/2010  . Influenza, High Dose Seasonal PF 12/09/2013, 12/11/2015, 12/24/2016, 01/01/2018  . Influenza,inj,Quad PF,6+ Mos 01/23/2015  . Pneumococcal Conjugate-13 04/21/2014  . Pneumococcal-Unspecified 02/25/2005   Screening Tests Health Maintenance  Topic Date Due  . FOOT EXAM  12/17/1934  . TETANUS/TDAP  12/17/1943  . HEMOGLOBIN A1C  06/23/2017  . OPHTHALMOLOGY EXAM  10/22/2018  . INFLUENZA VACCINE  Completed  . PNA vac Low Risk Adult  Completed  . DEXA SCAN  Discontinued      Plan:    Continue doing brain stimulating activities (puzzles, reading, adult coloring books, staying active) to keep memory sharp.   Continue to eat heart healthy diet (full of fruits, vegetables, whole grains, lean protein, water--limit salt, fat, and sugar intake) and increase physical activity as tolerated.  I have personally reviewed and noted the following in the patient's chart:   . Medical and social history . Use of alcohol, tobacco or illicit drugs  . Current medications and supplements . Functional ability and status . Nutritional status . Physical activity . Advanced directives . List of other physicians . Vitals . Screenings to include cognitive, depression, and falls . Referrals and appointments  In addition, I have reviewed and discussed with patient certain preventive protocols, quality metrics, and best practice recommendations. A written personalized care plan for preventive services as well as general preventive health recommendations were provided  to patient.     Michiel Cowboy, RN  02/25/2018  Medical screening examination/treatment/procedure(s) were performed by non-physician practitioner and as supervising physician I was immediately available for consultation/collaboration. I agree with above. Lew Dawes, MD

## 2018-02-25 NOTE — Patient Instructions (Addendum)
Continue doing brain stimulating activities (puzzles, reading, adult coloring books, staying active) to keep memory sharp.   Continue to eat heart healthy diet (full of fruits, vegetables, whole grains, lean protein, water--limit salt, fat, and sugar intake) and increase physical activity as tolerated.  Health Maintenance, Female Adopting a healthy lifestyle and getting preventive care can go a long way to promote health and wellness. Talk with your health care provider about what schedule of regular examinations is right for you. This is a good chance for you to check in with your provider about disease prevention and staying healthy. In between checkups, there are plenty of things you can do on your own. Experts have done a lot of research about which lifestyle changes and preventive measures are most likely to keep you healthy. Ask your health care provider for more information. Weight and diet Eat a healthy diet  Be sure to include plenty of vegetables, fruits, low-fat dairy products, and lean protein.  Do not eat a lot of foods high in solid fats, added sugars, or salt.  Get regular exercise. This is one of the most important things you can do for your health. ? Most adults should exercise for at least 150 minutes each week. The exercise should increase your heart rate and make you sweat (moderate-intensity exercise). ? Most adults should also do strengthening exercises at least twice a week. This is in addition to the moderate-intensity exercise.  Maintain a healthy weight  Body mass index (BMI) is a measurement that can be used to identify possible weight problems. It estimates body fat based on height and weight. Your health care provider can help determine your BMI and help you achieve or maintain a healthy weight.  For females 40 years of age and older: ? A BMI below 18.5 is considered underweight. ? A BMI of 18.5 to 24.9 is normal. ? A BMI of 25 to 29.9 is considered  overweight. ? A BMI of 30 and above is considered obese.  Watch levels of cholesterol and blood lipids  You should start having your blood tested for lipids and cholesterol at 82 years of age, then have this test every 5 years.  You may need to have your cholesterol levels checked more often if: ? Your lipid or cholesterol levels are high. ? You are older than 82 years of age. ? You are at high risk for heart disease.  Cancer screening Lung Cancer  Lung cancer screening is recommended for adults 53-15 years old who are at high risk for lung cancer because of a history of smoking.  A yearly low-dose CT scan of the lungs is recommended for people who: ? Currently smoke. ? Have quit within the past 15 years. ? Have at least a 30-pack-year history of smoking. A pack year is smoking an average of one pack of cigarettes a day for 1 year.  Yearly screening should continue until it has been 15 years since you quit.  Yearly screening should stop if you develop a health problem that would prevent you from having lung cancer treatment.  Breast Cancer  Practice breast self-awareness. This means understanding how your breasts normally appear and feel.  It also means doing regular breast self-exams. Let your health care provider know about any changes, no matter how small.  If you are in your 20s or 30s, you should have a clinical breast exam (CBE) by a health care provider every 1-3 years as part of a regular health exam.  If  you are 40 or older, have a CBE every year. Also consider having a breast X-ray (mammogram) every year.  If you have a family history of breast cancer, talk to your health care provider about genetic screening.  If you are at high risk for breast cancer, talk to your health care provider about having an MRI and a mammogram every year.  Breast cancer gene (BRCA) assessment is recommended for women who have family members with BRCA-related cancers. BRCA-related cancers  include: ? Breast. ? Ovarian. ? Tubal. ? Peritoneal cancers.  Results of the assessment will determine the need for genetic counseling and BRCA1 and BRCA2 testing.  Cervical Cancer Your health care provider may recommend that you be screened regularly for cancer of the pelvic organs (ovaries, uterus, and vagina). This screening involves a pelvic examination, including checking for microscopic changes to the surface of your cervix (Pap test). You may be encouraged to have this screening done every 3 years, beginning at age 34.  For women ages 5-65, health care providers may recommend pelvic exams and Pap testing every 3 years, or they may recommend the Pap and pelvic exam, combined with testing for human papilloma virus (HPV), every 5 years. Some types of HPV increase your risk of cervical cancer. Testing for HPV may also be done on women of any age with unclear Pap test results.  Other health care providers may not recommend any screening for nonpregnant women who are considered low risk for pelvic cancer and who do not have symptoms. Ask your health care provider if a screening pelvic exam is right for you.  If you have had past treatment for cervical cancer or a condition that could lead to cancer, you need Pap tests and screening for cancer for at least 20 years after your treatment. If Pap tests have been discontinued, your risk factors (such as having a new sexual partner) need to be reassessed to determine if screening should resume. Some women have medical problems that increase the chance of getting cervical cancer. In these cases, your health care provider may recommend more frequent screening and Pap tests.  Colorectal Cancer  This type of cancer can be detected and often prevented.  Routine colorectal cancer screening usually begins at 82 years of age and continues through 82 years of age.  Your health care provider may recommend screening at an earlier age if you have risk factors  for colon cancer.  Your health care provider may also recommend using home test kits to check for hidden blood in the stool.  A small camera at the end of a tube can be used to examine your colon directly (sigmoidoscopy or colonoscopy). This is done to check for the earliest forms of colorectal cancer.  Routine screening usually begins at age 70.  Direct examination of the colon should be repeated every 5-10 years through 82 years of age. However, you may need to be screened more often if early forms of precancerous polyps or small growths are found.  Skin Cancer  Check your skin from head to toe regularly.  Tell your health care provider about any new moles or changes in moles, especially if there is a change in a mole's shape or color.  Also tell your health care provider if you have a mole that is larger than the size of a pencil eraser.  Always use sunscreen. Apply sunscreen liberally and repeatedly throughout the day.  Protect yourself by wearing long sleeves, pants, a wide-brimmed hat, and sunglasses  whenever you are outside.  Heart disease, diabetes, and high blood pressure  High blood pressure causes heart disease and increases the risk of stroke. High blood pressure is more likely to develop in: ? People who have blood pressure in the high end of the normal range (130-139/85-89 mm Hg). ? People who are overweight or obese. ? People who are African American.  If you are 64-28 years of age, have your blood pressure checked every 3-5 years. If you are 58 years of age or older, have your blood pressure checked every year. You should have your blood pressure measured twice-once when you are at a hospital or clinic, and once when you are not at a hospital or clinic. Record the average of the two measurements. To check your blood pressure when you are not at a hospital or clinic, you can use: ? An automated blood pressure machine at a pharmacy. ? A home blood pressure monitor.  If  you are between 46 years and 42 years old, ask your health care provider if you should take aspirin to prevent strokes.  Have regular diabetes screenings. This involves taking a blood sample to check your fasting blood sugar level. ? If you are at a normal weight and have a low risk for diabetes, have this test once every three years after 82 years of age. ? If you are overweight and have a high risk for diabetes, consider being tested at a younger age or more often. Preventing infection Hepatitis B  If you have a higher risk for hepatitis B, you should be screened for this virus. You are considered at high risk for hepatitis B if: ? You were born in a country where hepatitis B is common. Ask your health care provider which countries are considered high risk. ? Your parents were born in a high-risk country, and you have not been immunized against hepatitis B (hepatitis B vaccine). ? You have HIV or AIDS. ? You use needles to inject street drugs. ? You live with someone who has hepatitis B. ? You have had sex with someone who has hepatitis B. ? You get hemodialysis treatment. ? You take certain medicines for conditions, including cancer, organ transplantation, and autoimmune conditions.  Hepatitis C  Blood testing is recommended for: ? Everyone born from [birth year] through 1963. ? Anyone with known risk factors for hepatitis C.  Sexually transmitted infections (STIs)  You should be screened for sexually transmitted infections (STIs) including gonorrhea and chlamydia if: ? You are sexually active and are younger than 82 years of age. ? You are older than 82 years of age and your health care provider tells you that you are at risk for this type of infection. ? Your sexual activity has changed since you were last screened and you are at an increased risk for chlamydia or gonorrhea. Ask your health care provider if you are at risk.  If you do not have HIV, but are at risk, it may be recommended  that you take a prescription medicine daily to prevent HIV infection. This is called pre-exposure prophylaxis (PrEP). You are considered at risk if: ? You are sexually active and do not regularly use condoms or know the HIV status of your partner(s). ? You take drugs by injection. ? You are sexually active with a partner who has HIV.  Talk with your health care provider about whether you are at high risk of being infected with HIV. If you choose to begin PrEP, you should  first be tested for HIV. You should then be tested every 3 months for as long as you are taking PrEP. Pregnancy  If you are premenopausal and you may become pregnant, ask your health care provider about preconception counseling.  If you may become pregnant, take 400 to 800 micrograms (mcg) of folic acid every day.  If you want to prevent pregnancy, talk to your health care provider about birth control (contraception). Osteoporosis and menopause  Osteoporosis is a disease in which the bones lose minerals and strength with aging. This can result in serious bone fractures. Your risk for osteoporosis can be identified using a bone density scan.  If you are 26 years of age or older, or if you are at risk for osteoporosis and fractures, ask your health care provider if you should be screened.  Ask your health care provider whether you should take a calcium or vitamin D supplement to lower your risk for osteoporosis.  Menopause may have certain physical symptoms and risks.  Hormone replacement therapy may reduce some of these symptoms and risks. Talk to your health care provider about whether hormone replacement therapy is right for you. Follow these instructions at home:  Schedule regular health, dental, and eye exams.  Stay current with your immunizations.  Do not use any tobacco products including cigarettes, chewing tobacco, or electronic cigarettes.  If you are pregnant, do not drink alcohol.  If you are  breastfeeding, limit how much and how often you drink alcohol.  Limit alcohol intake to no more than 1 drink per day for nonpregnant women. One drink equals 12 ounces of beer, 5 ounces of wine, or 1 ounces of hard liquor.  Do not use street drugs.  Do not share needles.  Ask your health care provider for help if you need support or information about quitting drugs.  Tell your health care provider if you often feel depressed.  Tell your health care provider if you have ever been abused or do not feel safe at home. This information is not intended to replace advice given to you by your health care provider. Make sure you discuss any questions you have with your health care provider. Document Released: 10/14/2010 Document Revised: 09/06/2015 Document Reviewed: 01/02/2015 Elsevier Interactive Patient Education  [employer].

## 2018-02-26 ENCOUNTER — Encounter: Payer: MEDICARE | Admitting: Nurse Practitioner

## 2018-03-03 ENCOUNTER — Other Ambulatory Visit: Payer: Self-pay | Admitting: Internal Medicine

## 2018-03-03 NOTE — Telephone Encounter (Signed)
Copied from [place] [phone]. Topic: Quick Communication - Rx Refill/Question >> Mar 03, 2018  4:12 PM Margot Ables wrote: Medication: potassium chloride (K-DUR) 10 MEQ tablet - 2-3 weeks - pt and Bernidine from [facility] called to see if medication can be sent to mail order in 90 day supply - pt doesn't drive and mail order would be helpful ipratropium (ATROVENT) 0.06 % nasal spray - almost out - pt requesting this thru mail order as well - also needing 90 day supply (3 bottles at a time)  Has the patient contacted their pharmacy? Yes - pharmacy calling with pt on the line Preferred Pharmacy (with phone number or street name[facility] [place] [place] [facility] [address] [phone] (Phone) [phone] (Fax)

## 2018-03-04 MED ORDER — POTASSIUM CHLORIDE ER 10 MEQ PO TBCR: 10.0000 meq | EXTENDED_RELEASE_TABLET | Freq: Every day | ORAL | 0 refills | Status: DC

## 2018-03-04 MED ORDER — IPRATROPIUM BROMIDE 0.06 % NA SOLN: 2.0000 | Freq: Three times a day (TID) | NASAL | 1 refills | Status: DC

## 2018-03-04 NOTE — Telephone Encounter (Signed)
Requested Prescriptions  Pending Prescriptions Disp Refills  . ipratropium (ATROVENT) 0.06 % nasal spray 15 mL     Sig: Place 2 sprays into both nostrils 3 (three) times daily.     Off-Protocol Failed - 03/04/2018 10:28 AM      Failed - Medication not assigned to a protocol, review manually.      Passed - Valid encounter within last 12 months    Recent Outpatient Visits          1 month ago Cough   Kayla Davis, Kayla Davis, Kayla Davis   2 months ago Need for influenza vaccination   [place], [place]   3 months ago Cough in adult   [place], MD   7 months ago Colon cancer screening   [place], MD   11 months ago Constipation, unspecified constipation type   [place], MD      Future Appointments            In 1 month Plotnikov, Evie Lacks, MD [place], [place]   In 12 months  [place], [place]         . potassium chloride (K-DUR) 10 MEQ tablet 90 tablet 0    Sig: Take 1 tablet (10 mEq total) by mouth daily.     Endocrinology:  Minerals - Potassium Supplementation Passed - 03/04/2018 10:28 AM      Passed - K in normal range and within 360 days    Potassium  Date Value Ref Range Status  12/03/2017 4.0 3.5 - 5.1 mEq/L Final         Passed - Cr in normal range and within 360 days    Creatinine, Ser  Date Value Ref Range Status  12/03/2017 1.17 0.40 - 1.20 mg/dL Final         Passed - Valid encounter within last 12 months    Recent Outpatient Visits          1 month ago Cough   [place], Kayla Davis, [place]   2 months ago Need for influenza vaccination   [place], Evie Lacks, MD   3 months ago Cough in adult   [place], Evie Lacks, MD   7 months ago Colon cancer screening   [place], MD   11 months ago Constipation, unspecified constipation type   [place], Evie Lacks, MD      Future Appointments            In 1 month Plotnikov, Evie Lacks, MD [place], [place]   In 12 months  [place], [facility]

## 2018-03-04 NOTE — Telephone Encounter (Signed)
Requested medication (s) are due for refill today:  ?  Requested medication (s) are on the active medication list: yes  Last refill: ?  Future visit scheduled: yes  Notes to clinic:  Historical provider; no protocol    Requested Prescriptions  Pending Prescriptions Disp Refills   ipratropium (ATROVENT) 0.06 % nasal spray 15 mL     Sig: Place 2 sprays into both nostrils 3 (three) times daily.     Off-Protocol Failed - 03/04/2018 10:28 AM      Failed - Medication not assigned to a protocol, review manually.      Passed - Valid encounter within last 12 months    Recent Outpatient Visits          1 month ago Cough   [place], Marvis Repress, [place]   2 months ago Need for influenza vaccination   [place] Plotnikov, Evie Lacks, MD   3 months ago Cough in adult   [place], Evie Lacks, MD   7 months ago Colon cancer screening   [place] Plotnikov, Evie Lacks, MD   11 months ago Constipation, unspecified constipation type   [place], MD      Future Appointments            In 1 month Plotnikov, Evie Lacks, MD [place], [place]   In 12 months  [place], [facility]         Signed Prescriptions Disp Refills   potassium chloride (K-DUR) 10 MEQ tablet 90 tablet 0    Sig: Take 1 tablet (10 mEq total) by mouth daily.     Endocrinology:  Minerals - Potassium Supplementation Passed - 03/04/2018 10:28 AM      Passed - K in normal range and within 360 days    Potassium  Date Value Ref Range Status  12/03/2017 4.0 3.5 - 5.1 mEq/L Final         Passed - Cr in normal range and within 360 days    Creatinine, Ser  Date Value Ref Range Status  12/03/2017 1.17 0.40 - 1.20 mg/dL Final         Passed - Valid encounter within last 12 months    Recent Outpatient  Visits          1 month ago Cough   [place], Marvis Repress, [place]   2 months ago Need for influenza vaccination   [place], Evie Lacks, MD   3 months ago Cough in adult   [place], Evie Lacks, MD   7 months ago Colon cancer screening   [place], MD   11 months ago Constipation, unspecified constipation type   [place], Evie Lacks, MD      Future Appointments            In 1 month Plotnikov, Evie Lacks, MD [place], [place]   In 12 months  [place], [facility]

## 2018-03-15 DIAGNOSIS — R310 Gross hematuria: Secondary | ICD-10-CM | POA: Diagnosis not present

## 2018-03-15 DIAGNOSIS — N3000 Acute cystitis without hematuria: Secondary | ICD-10-CM | POA: Diagnosis not present

## 2018-04-12 ENCOUNTER — Telehealth: Payer: Self-pay | Admitting: Internal Medicine

## 2018-04-12 NOTE — Telephone Encounter (Signed)
Current prescription of Metoprolol is expired.

## 2018-04-12 NOTE — Telephone Encounter (Signed)
Copied from [place] [id number]. Topic: Quick Communication - Rx Refill/Question >> Apr 12, 2018  3:06 PM Judyann Munson wrote: Medication: metoprolol tartrate (LOPRESSOR) 25 MG tablet (Expired)  Has the patient contacted their pharmacy? {no   Preferred Pharmacy (with phone number or street name): [id number] [place], [place]. [address] [phone] (Phone) [phone] (Fax)    Agent: Please be advised that RX refills may take up to 3 business days. We ask that you follow-up with your pharmacy.

## 2018-04-13 MED ORDER — METOPROLOL TARTRATE 25 MG PO TABS: 25.0000 mg | ORAL_TABLET | Freq: Two times a day (BID) | ORAL | 1 refills | Status: DC

## 2018-04-13 NOTE — Telephone Encounter (Signed)
RX sent

## 2018-04-15 ENCOUNTER — Encounter: Payer: Self-pay | Admitting: Internal Medicine

## 2018-04-15 ENCOUNTER — Ambulatory Visit (INDEPENDENT_AMBULATORY_CARE_PROVIDER_SITE_OTHER): Payer: MEDICARE | Admitting: Internal Medicine

## 2018-04-15 ENCOUNTER — Other Ambulatory Visit (INDEPENDENT_AMBULATORY_CARE_PROVIDER_SITE_OTHER): Payer: MEDICARE

## 2018-04-15 VITALS — BP 120/76 | HR 66 | Temp 97.4°F | Ht 65.0 in | Wt 156.0 lb

## 2018-04-15 DIAGNOSIS — I10 Essential (primary) hypertension: Secondary | ICD-10-CM | POA: Diagnosis not present

## 2018-04-15 DIAGNOSIS — E871 Hypo-osmolality and hyponatremia: Secondary | ICD-10-CM | POA: Diagnosis not present

## 2018-04-15 DIAGNOSIS — E119 Type 2 diabetes mellitus without complications: Secondary | ICD-10-CM

## 2018-04-15 DIAGNOSIS — E034 Atrophy of thyroid (acquired): Secondary | ICD-10-CM | POA: Diagnosis not present

## 2018-04-15 DIAGNOSIS — Z23 Encounter for immunization: Secondary | ICD-10-CM | POA: Diagnosis not present

## 2018-04-15 LAB — HEMOGLOBIN A1C: HEMOGLOBIN A1C: 6.5 % (ref 4.6–6.5)

## 2018-04-15 LAB — BASIC METABOLIC PANEL
BUN: 22 mg/dL (ref 6–23)
CHLORIDE: 95 meq/L — AB (ref 96–112)
CO2: 29 mEq/L (ref 19–32)
Calcium: 9.7 mg/dL (ref 8.4–10.5)
Creatinine, Ser: 1 mg/dL (ref 0.40–1.20)
GFR: 54.95 mL/min — AB (ref >60.00)
Glucose, Bld: 91 mg/dL (ref 70–99)
POTASSIUM: 5.4 meq/L — AB (ref 3.5–5.1)
SODIUM: 130 meq/L — AB (ref 135–145)

## 2018-04-15 LAB — T4, FREE: FREE T4: 0.89 ng/dL (ref 0.60–1.60)

## 2018-04-15 LAB — TSH: TSH: 10.14 u[IU]/mL — ABNORMAL HIGH (ref 0.35–4.50)

## 2018-04-15 MED ORDER — LOSARTAN POTASSIUM 25 MG PO TABS: 25.0000 mg | ORAL_TABLET | Freq: Two times a day (BID) | ORAL | 3 refills | Status: DC

## 2018-04-15 MED ORDER — METOPROLOL TARTRATE 25 MG PO TABS: 25.0000 mg | ORAL_TABLET | Freq: Two times a day (BID) | ORAL | 0 refills | Status: DC

## 2018-04-15 NOTE — Progress Notes (Signed)
Subjective:  Patient ID: [name], female    DOB: December 11, 1922  Age: 85 y.o. MRN: [id number]  CC: No chief complaint on file.   HPI EMMERSEN GARRAWAY presents for HTN, allergies, dyslipidemia f/u  Outpatient Medications Prior to Visit  Medication Sig Dispense Refill  . ALPHA LIPOIC ACID PO Take 1 tablet by mouth daily.    [name] aspirin 325 MG tablet Take 325 mg by mouth daily.    [name] CRANBERRY PO Take 1 tablet by mouth daily with supper.    . Cyanocobalamin (VITAMIN B-12 PO) Take 1 tablet by mouth at bedtime.    [name] Calcium (STOOL SOFTENER PO) Take 2 capsules by mouth at bedtime.    [name] ESTRACE VAGINAL 0.1 MG/GM vaginal cream     . EVENING PRIMROSE OIL PO Take 1 capsule by mouth daily.    . fexofenadine (ALLEGRA) 30 MG tablet Take 30 mg by mouth 2 (two) times daily.    . fluticasone (FLONASE) 50 MCG/ACT nasal spray Place 2 sprays into both nostrils daily. 16 g 6  . ipratropium (ATROVENT) 0.06 % nasal spray Place 2 sprays into both nostrils 3 (three) times daily. 45 mL 1  . Lancets (ONETOUCH ULTRASOFT) lancets Use to check blood sugars once a day Dx E11.9 100 each 3  . levothyroxine (SYNTHROID, LEVOTHROID) 100 MCG tablet Take 1 tablet (100 mcg total) by mouth daily before breakfast. 90 tablet 1  . LYRICA 75 MG capsule TAKE 1 CAPSULE TWICE DAILY 180 capsule 3  . Magnesium 250 MG TABS Take 1 tablet daily by mouth.     . metoprolol tartrate (LOPRESSOR) 25 MG tablet Take 1 tablet (25 mg total) by mouth 2 (two) times daily. 180 tablet 1  . Multiple Vitamins-Minerals (EYE VITAMINS) CAPS Take 1 capsule by mouth daily.    [name] (SYSTANE OP) Apply to eye daily.    . polyethylene glycol (MIRALAX / GLYCOLAX) packet Take 17 g by mouth daily as needed for mild constipation.    . potassium chloride (K-DUR) 10 MEQ tablet Take 1 tablet (10 mEq total) by mouth daily. 90 tablet 0  . simvastatin (ZOCOR) 40 MG tablet Take 1 tablet (40 mg total) by mouth daily at 6 PM. 90  tablet 3  . losartan (COZAAR) 25 MG tablet TAKE 1 TABLET TWICE A DAY 180 tablet 3   No facility-administered medications prior to visit.     ROS: Review of Systems  Constitutional: Negative for activity change, appetite change, chills, fatigue and unexpected weight change.  HENT: Positive for congestion. Negative for mouth sores and sinus pressure.   Eyes: Negative for visual disturbance.  Respiratory: Negative for cough and chest tightness.   Gastrointestinal: Negative for abdominal pain and nausea.  Genitourinary: Negative for difficulty urinating, frequency and vaginal pain.  Musculoskeletal: Negative for back pain and gait problem.  Skin: Negative for pallor and rash.  Neurological: Negative for dizziness, tremors, weakness, numbness and headaches.  Psychiatric/Behavioral: Negative for confusion and sleep disturbance.    Objective:  BP 120/76 (BP Location: Right Arm, Patient Position: Sitting, Cuff Size: Normal)   Pulse 66   Temp (!) 97.4 F (36.3 C) (Oral)   Ht 5\' 5"  (1.651 m)   Wt 156 lb (70.8 kg)   LMP  (LMP Unknown)   SpO2 96%   BMI 25.96 kg/m   BP Readings from Last 3 Encounters:  04/15/18 120/76  02/25/18 128/74  01/29/18 126/68    Wt Readings from Last 3  Encounters:  04/15/18 156 lb (70.8 kg)  02/25/18 160 lb (72.6 kg)  01/29/18 159 lb 1.3 oz (72.2 kg)    Physical Exam Constitutional:      General: She is not in acute distress.    Appearance: She is well-developed.  HENT:     Head: Normocephalic.     Right Ear: External ear normal.     Left Ear: External ear normal.     Nose: Nose normal.  Eyes:     General:        Right eye: No discharge.        Left eye: No discharge.     Conjunctiva/sclera: Conjunctivae normal.     Pupils: Pupils are equal, round, and reactive to light.  Neck:     Musculoskeletal: Normal range of motion and neck supple.     Thyroid: No thyromegaly.     Vascular: No JVD.     Trachea: No tracheal deviation.  Cardiovascular:       Rate and Rhythm: Normal rate and regular rhythm.     Heart sounds: Normal heart sounds.  Pulmonary:     Effort: No respiratory distress.     Breath sounds: No stridor. No wheezing.  Abdominal:     General: Bowel sounds are normal. There is no distension.     Palpations: Abdomen is soft. There is no mass.     Tenderness: There is no abdominal tenderness. There is no guarding or rebound.  Musculoskeletal:        General: No tenderness.  Lymphadenopathy:     Cervical: No cervical adenopathy.  Skin:    Findings: No erythema or rash.  Neurological:     Cranial Nerves: No cranial nerve deficit.     Motor: No abnormal muscle tone.     Coordination: Coordination normal.     Deep Tendon Reflexes: Reflexes normal.  Psychiatric:        Behavior: Behavior normal.        Thought Content: Thought content normal.        Judgment: Judgment normal.   walker  Hard hearing  Lab Results  Component Value Date   WBC 6.1 11/20/2017   HGB 12.1 11/20/2017   HCT 35.3 (L) 11/20/2017   PLT 258.0 11/20/2017   GLUCOSE 111 (H) 12/03/2017   CHOL 162 12/24/2016   TRIG 97.0 12/24/2016   HDL 80.70 12/24/2016   LDLDIRECT 143.0 10/07/2007   LDLCALC 62 12/24/2016   ALT 12 11/20/2017   AST 15 11/20/2017   NA 132 (L) 12/03/2017   K 4.0 12/03/2017   CL 95 (L) 12/03/2017   CREATININE 1.17 12/03/2017   BUN 19 12/03/2017   CO2 30 12/03/2017   TSH 6.38 (H) 12/24/2016   INR 1.1 (H) 11/20/2017   HGBA1C 6.6 (H) 12/24/2016    Dg Chest 2 View  Result Date: 11/21/2017 CLINICAL DATA:  Cough. EXAM: CHEST - 2 VIEW COMPARISON:  08/16/2015 FINDINGS: Probable mild increase in heart size since the prior study without significant enlargement. Stable appearance of dual-chamber pacemaker. Stable tortuosity of the thoracic aorta. There is no evidence of pulmonary edema, consolidation, pneumothorax, nodule or pleural fluid. The bony thorax shows stable degenerative disease of the thoracic spine without visible  fracture or bony lesion. IMPRESSION: No acute findings. The heart likely has enlarged slightly since 2017 but remains without significant enlargement. Pacemaker positioning appears stable. Electronically Signed   By: Aletta Edouard M.D.   On: 11/21/2017 10:23    Assessment & Plan:  Diagnoses and all orders for this visit:  Essential hypertension -     losartan (COZAAR) 25 MG tablet; Take 1 tablet (25 mg total) by mouth 2 (two) times daily.     Meds ordered this encounter  Medications  . losartan (COZAAR) 25 MG tablet    Sig: Take 1 tablet (25 mg total) by mouth 2 (two) times daily.    Dispense:  180 tablet    Refill:  3     Follow-up: No follow-ups on file.  [name], MD

## 2018-04-15 NOTE — Addendum Note (Signed)
Addended by: Karren Cobble on: 04/15/2018 03:58 PM   Modules accepted: Orders

## 2018-04-16 ENCOUNTER — Encounter: Payer: Self-pay | Admitting: Nurse Practitioner

## 2018-04-16 ENCOUNTER — Ambulatory Visit (INDEPENDENT_AMBULATORY_CARE_PROVIDER_SITE_OTHER): Payer: MEDICARE | Admitting: Nurse Practitioner

## 2018-04-16 VITALS — BP 108/68 | HR 62 | Ht 65.0 in | Wt 158.0 lb

## 2018-04-16 DIAGNOSIS — I495 Sick sinus syndrome: Secondary | ICD-10-CM

## 2018-04-16 DIAGNOSIS — I471 Supraventricular tachycardia: Secondary | ICD-10-CM | POA: Diagnosis not present

## 2018-04-16 DIAGNOSIS — I10 Essential (primary) hypertension: Secondary | ICD-10-CM

## 2018-04-16 LAB — CUP PACEART INCLINIC DEVICE CHECK
Date Time Interrogation Session: 20200103103413
Implantable Lead Implant Date: 20111031
Implantable Lead Location: 753859
Implantable Lead Model: 4470
Implantable Lead Serial Number: 548557
Implantable Lead Serial Number: 686076
Implantable Pulse Generator Implant Date: 20111031
MDC IDC LEAD IMPLANT DT: 20111031
MDC IDC LEAD LOCATION: 753860

## 2018-04-16 NOTE — Progress Notes (Signed)
Electrophysiology Office Note Date: 04/16/2018  ID:  Kayla Davis, DOB 27-Sep-1922, MRN [id number]  PCP: [name], MD Electrophysiologist: [name]  CC: Pacemaker follow-up  Kayla Davis is a 85 y.o. female seen today for Dr [name].  She presents today for routine electrophysiology followup.  Since last being seen in our clinic, the patient reports doing reasonably well. She has dependent edema that resolves at night.  She denies chest pain, palpitations, dyspnea (above baseline), PND, orthopnea, nausea, vomiting, dizziness, syncope, weight gain, or early satiety.  Device History: MDT dual chamber PPM implanted 2011 for sinus node dysfunction   Past Medical History:  Diagnosis Date  . Anemia, unspecified   . Anxiety state, unspecified   . Aortic valve disorders   . Bradycardia    s/p PPM  . Bursitis   . Complication of anesthesia    has had difficulty awakening in past; pt states did well with the anesthesia given during her carotid surgery   . Diabetes mellitus without complication ([place])   . Diverticulosis of colon (without mention of hemorrhage)   . Herpes zoster with other nervous system complications(053.19)   . Hyperlipidemia   . Hypertension   . Hypothyroidism   . Irritable bowel syndrome   . Menopause   . Osteoarthrosis, unspecified whether generalized or localized, unspecified site   . Peripheral vascular disease ([place])   . TIA (transient ischemic attack)   . Unspecified hearing loss   . Varicose veins    Past Surgical History:  Procedure Laterality Date  . APPENDECTOMY    . CATARACT EXTRACTION, BILATERAL  end of June 2013  . CHOLECYSTECTOMY    . DILATION AND CURETTAGE OF UTERUS    . ENDARTERECTOMY Right 01/20/2014   Procedure: RIGHT CAROTID ARTERY ENDARTERECTOMY WITH DACRON PATCH ANGIOPLASTY;  Surgeon: Mal Misty, MD;  Location: [facility];  Service: Vascular;  Laterality: Right;  . PACEMAKER INSERTION     MDT dual chamber PPM implanted by Dr  [name] for sinus node dysfunction   . TONSILLECTOMY    . TOTAL HIP ARTHROPLASTY Left 03/23/2015   Procedure: TOTAL HIP ARTHROPLASTY ANTERIOR APPROACH;  Surgeon: Gaynelle Arabian, MD;  Location: [facility];  Service: Orthopedics;  Laterality: Left;  [name] VAGINAL HYSTERECTOMY      Current Outpatient Medications  Medication Sig Dispense Refill  . ALPHA LIPOIC ACID PO Take 1 tablet by mouth daily.    [name] aspirin 325 MG tablet Take 325 mg by mouth daily.    [name] CRANBERRY PO Take 1 tablet by mouth daily with supper.    . Cyanocobalamin (VITAMIN B-12 PO) Take 1 tablet by mouth at bedtime.    [name] Calcium (STOOL SOFTENER PO) Take 2 capsules by mouth at bedtime.    [name] EVENING PRIMROSE OIL PO Take 1 capsule by mouth daily.    . fluticasone (FLONASE) 50 MCG/ACT nasal spray Place 2 sprays into both nostrils daily. 16 g 6  . ipratropium (ATROVENT) 0.06 % nasal spray Place 2 sprays into both nostrils 3 (three) times daily. 45 mL 1  . Lancets (ONETOUCH ULTRASOFT) lancets Use to check blood sugars once a day Dx E11.9 100 each 3  . levothyroxine (SYNTHROID, LEVOTHROID) 100 MCG tablet Take 1 tablet (100 mcg total) by mouth daily before breakfast. 90 tablet 1  . losartan (COZAAR) 25 MG tablet Take 1 tablet (25 mg total) by mouth 2 (two) times daily. 180 tablet 3  . LYRICA 75 MG capsule TAKE 1 CAPSULE TWICE DAILY  180 capsule 3  . Magnesium 250 MG TABS Take 1 tablet daily by mouth.     . metoprolol tartrate (LOPRESSOR) 25 MG tablet Take 1 tablet (25 mg total) by mouth 2 (two) times daily. 60 tablet 0  . Multiple Vitamins-Minerals (EYE VITAMINS) CAPS Take 1 capsule by mouth daily.    [name] (SYSTANE OP) Apply to eye daily.    . polyethylene glycol (MIRALAX / GLYCOLAX) packet Take 17 g by mouth daily as needed for mild constipation.    . potassium chloride (K-DUR) 10 MEQ tablet Take 1 tablet (10 mEq total) by mouth daily. 90 tablet 0  . simvastatin (ZOCOR) 40 MG tablet Take 1 tablet (40 mg  total) by mouth daily at 6 PM. 90 tablet 3   No current facility-administered medications for this visit.     Allergies:   Penicillins; Alprazolam; Bactrim [sulfamethoxazole-trimethoprim]; Ceclor [cefaclor]; Cephalexin; Ciprofloxacin; Clindamycin/lincomycin; Hydrocodone; Noroxin [norfloxacin]; Sulfonamide derivatives; Tetracyclines & related; and Tramadol   Social History: Social History   Socioeconomic History  . Marital status: Widowed    Spouse name: Gypsy Balsam.  (deceased)  . Number of children: 4  . Years of education: COLLEGE  . Highest education level: Not on file  Occupational History  . Occupation: Retired    Fish farm manager: RETIRED  Social Needs  . Financial resource strain: Not hard at all  . Food insecurity:    Worry: Never true    Inability: Never true  . Transportation needs:    Medical: No    Non-medical: No  Tobacco Use  . Smoking status: Never Smoker  . Smokeless tobacco: Never Used  Substance and Sexual Activity  . Alcohol use: No    Alcohol/week: 0.0 standard drinks  . Drug use: No  . Sexual activity: Not Currently  Lifestyle  . Physical activity:    Days per week: 3 days    Minutes per session: 30 min  . Stress: Not at all  Relationships  . Social connections:    Talks on phone: More than three times a week    Gets together: More than three times a week    Attends religious service: 1 to 4 times per year    Active member of club or organization: Yes    Attends meetings of clubs or organizations: 1 to 4 times per year    Relationship status: Widowed  . Intimate partner violence:    Fear of current or ex partner: Not on file    Emotionally abused: Not on file    Physically abused: Not on file    Forced sexual activity: Not on file  Other Topics Concern  . Not on file  Social History Narrative   Patient is widowed with 4 children. Son stays with patient at night, 08/11/16.   Patient is right handed.   Patient has college education.   Patient drinks 2  cups daily.    Family History: Family History  Problem Relation Age of Onset  . Heart disease Mother   . Heart disease Father   . Diabetes Father   . Cancer Paternal Grandmother      Review of Systems: All other systems reviewed and are otherwise negative except as noted above.   Physical Exam: VS:  BP 108/68   Pulse 62   Ht 5\' 5"  (1.651 m)   Wt 158 lb (71.7 kg)   LMP  (LMP Unknown)   SpO2 97%   BMI 26.29 kg/m  , BMI Body mass  index is 26.29 kg/m.  GEN- The patient is elderly appearing, alert and oriented x 3 today.   HEENT: normocephalic, atraumatic; sclera clear, conjunctiva pink; hearing intact; oropharynx clear; neck supple  Lungs- Clear to ausculation bilaterally, normal work of breathing.  No wheezes, rales, rhonchi Heart- Regular rate and rhythm  GI- soft, non-tender, non-distended, bowel sounds present  Extremities- no clubbing, cyanosis, or edema  MS- no significant deformity or atrophy Skin- warm and dry, no rash or lesion; PPM pocket well healed Psych- euthymic mood, full affect Neuro- strength and sensation are intact  PPM Interrogation- reviewed in detail today,  See PACEART report  EKG:  EKG is not ordered today.  Recent Labs: 11/20/2017: ALT 12; Hemoglobin 12.1; Platelets 258.0 04/15/2018: BUN 22; Creatinine, Ser 1.00; Potassium 5.4; Sodium 130; TSH 10.14   Wt Readings from Last 3 Encounters:  04/16/18 158 lb (71.7 kg)  04/15/18 156 lb (70.8 kg)  02/25/18 160 lb (72.6 kg)       Assessment and Plan:  1.  Sinus node dysfunction Normal PPM function See Pace Art report No changes today  2.  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  HTN Stable No change required today  4.  Shortness of breath Likely multifactorial and stable    Current medicines are reviewed at length with the patient today.   The patient does not have concerns regarding her medicines.  The following changes were made today:  none  Labs/ tests ordered today  include: none Orders Placed This Encounter  Procedures  . CUP PACEART INCLINIC DEVICE CHECK     Disposition:   Follow up with [facility], Dr [name] 1 year    Signed, Chanetta Marshall, NP 04/16/2018 11:00 AM  [address] [place]  [place] [address] [phone] (office) [phone] (fax)

## 2018-04-16 NOTE — Patient Instructions (Signed)
Medication Instructions:  none If you need a refill on your cardiac medications before your next appointment, please call your pharmacy.   Lab work: none If you have labs (blood work) drawn today and your tests are completely normal, you will receive your results only by: [name] MyChart Message (if you have MyChart) OR . A paper copy in the mail If you have any lab test that is abnormal or we need to change your treatment, we will call you to review the results.  Testing/Procedures: none  Follow-Up: At [facility], you and your health needs are our priority.  As part of our continuing mission to provide you with exceptional heart care, we have created designated Provider Care Teams.  These Care Teams include your primary Cardiologist (physician) and Advanced Practice Providers (APPs -  Physician Assistants and Nurse Practitioners) who all work together to provide you with the care you need, when you need it. You will need a follow up appointment in 1 years.  Please call our office 2 months in advance to schedule this appointment.  You may see Dr [name] or one of the following Advanced Practice Providers on your designated Care Team:   Chanetta Marshall, NP . Tommye Standard, PA-C  Any Other Special Instructions Will Be Listed Below (If Applicable). Remote monitoring is used to monitor your Pacemaker  from home. This monitoring reduces the number of office visits required to check your device to one time per year. It allows [place] to keep an eye on the functioning of your device to ensure it is working properly. You are scheduled for a device check from home on 04/20/18. You may send your transmission at any time that day. If you have a wireless device, the transmission will be sent automatically. After your physician reviews your transmission, you will receive a postcard with your next transmission date.

## 2018-04-19 ENCOUNTER — Other Ambulatory Visit: Payer: Self-pay | Admitting: Internal Medicine

## 2018-04-20 ENCOUNTER — Ambulatory Visit (INDEPENDENT_AMBULATORY_CARE_PROVIDER_SITE_OTHER): Payer: MEDICARE

## 2018-04-20 DIAGNOSIS — I495 Sick sinus syndrome: Secondary | ICD-10-CM

## 2018-04-21 NOTE — Progress Notes (Signed)
Remote pacemaker transmission.   

## 2018-04-22 LAB — CUP PACEART REMOTE DEVICE CHECK
Battery Impedance: 872 Ohm
Battery Remaining Longevity: 66 mo
Brady Statistic AP VP Percent: 2 %
Brady Statistic AS VS Percent: 3 %
Date Time Interrogation Session: 20200108022627
Implantable Lead Implant Date: 20111031
Implantable Lead Location: 753859
Implantable Pulse Generator Implant Date: 20111031
Lead Channel Impedance Value: 480 Ohm
Lead Channel Pacing Threshold Amplitude: 0.75 V
Lead Channel Pacing Threshold Pulse Width: 0.4 ms
Lead Channel Setting Pacing Amplitude: 2 V
Lead Channel Setting Sensing Sensitivity: 5.6 mV
MDC IDC LEAD IMPLANT DT: 20111031
MDC IDC LEAD LOCATION: 753860
MDC IDC LEAD SERIAL: 548557
MDC IDC LEAD SERIAL: 686076
MDC IDC MSMT BATTERY VOLTAGE: 2.77 V
MDC IDC MSMT LEADCHNL RA PACING THRESHOLD AMPLITUDE: 0.5 V
MDC IDC MSMT LEADCHNL RA PACING THRESHOLD PULSEWIDTH: 0.4 ms
MDC IDC MSMT LEADCHNL RV IMPEDANCE VALUE: 509 Ohm
MDC IDC SET LEADCHNL RV PACING AMPLITUDE: 2.5 V
MDC IDC SET LEADCHNL RV PACING PULSEWIDTH: 0.4 ms
MDC IDC STAT BRADY AP VS PERCENT: 95 %
MDC IDC STAT BRADY AS VP PERCENT: 0 %

## 2018-04-29 ENCOUNTER — Ambulatory Visit: Payer: Self-pay

## 2018-04-29 NOTE — Telephone Encounter (Signed)
Returned call to patient who states that she has a cold with a really drippy nose.  She says that discharge from her nose is clear.  She says she coughs but maybe 3 times a day.  She is requesting that Dr Alain Marion call her in some ear drops.  She states that her left ear is ache. It awakens her from sleep.  She refused appointment stating that she would call back if anything get worse.  She denies fever.  She is taking an allergy medication and Ibuprofen. She is requesting neomycin/polymycin ear gtts call to her pharmacy. She says it helped her last year when she had the same symptoms. Home care advice read to patient. Pt verbalized understanding of all instructions.  Reason for Disposition . [1] Sinus congestion as part of a cold AND [2] present < 10 days  Answer Assessment - Initial Assessment Questions 1. LOCATION: "Where does it hurt?"      Left ear 2. ONSET: "When did the sinus pain start?"  (e.g., hours, days)     2-3 days 3. SEVERITY: "How bad is the pain?"   (Scale 1-10; mild, moderate or severe)   - MILD (1-3): doesn't interfere with normal activities    - MODERATE (4-7): interferes with normal activities (e.g., work or school) or awakens from sleep   - SEVERE (8-10): excruciating pain and patient unable to do any normal activities        moderate 4. RECURRENT SYMPTOM: "Have you ever had sinus problems before?" If so, ask: "When was the last time?" and "What happened that time?"      Last year 5. NASAL CONGESTION: "Is the nose blocked?" If so, ask, "Can you open it or must you breathe through the mouth?"    runny 6. NASAL DISCHARGE: "Do you have discharge from your nose?" If so ask, "What color?"     Clear taking allergy medication 7. FEVER: "Do you have a fever?" If so, ask: "What is it, how was it measured, and when did it start?"     No 8. OTHER SYMPTOMS: "Do you have any other symptoms?" (e.g., sore throat, cough, earache, difficulty breathing)    earach left ear worse that  right 9. PREGNANCY: "Is there any chance you are pregnant?" "When was your last menstrual period?"     N/A  Protocols used: SINUS PAIN OR CONGESTION-A-AH

## 2018-05-01 MED ORDER — NEOMYCIN-POLYMYXIN-HC 3.5-10000-1 OT SOLN: 3.0000 [drp] | Freq: Four times a day (QID) | OTIC | 3 refills | Status: AC

## 2018-05-01 NOTE — Telephone Encounter (Signed)
Ok Done Thx 

## 2018-05-01 NOTE — Addendum Note (Signed)
Addended by: [name] on: 05/01/2018 10:33 PM   Modules accepted: Orders

## 2018-05-29 DIAGNOSIS — R319 Hematuria, unspecified: Secondary | ICD-10-CM | POA: Diagnosis not present

## 2018-05-29 DIAGNOSIS — N390 Urinary tract infection, site not specified: Secondary | ICD-10-CM | POA: Diagnosis not present

## 2018-05-29 DIAGNOSIS — R6883 Chills (without fever): Secondary | ICD-10-CM | POA: Diagnosis not present

## 2018-06-02 ENCOUNTER — Other Ambulatory Visit: Payer: Self-pay | Admitting: Internal Medicine

## 2018-06-07 DIAGNOSIS — D049 Carcinoma in situ of skin, unspecified: Secondary | ICD-10-CM | POA: Diagnosis not present

## 2018-06-07 DIAGNOSIS — L259 Unspecified contact dermatitis, unspecified cause: Secondary | ICD-10-CM | POA: Diagnosis not present

## 2018-06-07 DIAGNOSIS — I872 Venous insufficiency (chronic) (peripheral): Secondary | ICD-10-CM | POA: Diagnosis not present

## 2018-06-14 DIAGNOSIS — N390 Urinary tract infection, site not specified: Secondary | ICD-10-CM | POA: Diagnosis not present

## 2018-06-14 DIAGNOSIS — R300 Dysuria: Secondary | ICD-10-CM | POA: Diagnosis not present

## 2018-06-14 DIAGNOSIS — R319 Hematuria, unspecified: Secondary | ICD-10-CM | POA: Diagnosis not present

## 2018-06-18 ENCOUNTER — Emergency Department (HOSPITAL_COMMUNITY): Payer: MEDICARE

## 2018-06-18 ENCOUNTER — Emergency Department (HOSPITAL_COMMUNITY)
Admission: EM | Admit: 2018-06-18 | Discharge: 2018-06-18 | Disposition: A | Discharge status: Home / Self Care | Payer: MEDICARE | Attending: Emergency Medicine | Admitting: Emergency Medicine

## 2018-06-18 ENCOUNTER — Other Ambulatory Visit: Payer: Self-pay

## 2018-06-18 DIAGNOSIS — I447 Left bundle-branch block, unspecified: Secondary | ICD-10-CM | POA: Diagnosis not present

## 2018-06-18 DIAGNOSIS — R6883 Chills (without fever): Secondary | ICD-10-CM | POA: Diagnosis not present

## 2018-06-18 DIAGNOSIS — E119 Type 2 diabetes mellitus without complications: Secondary | ICD-10-CM | POA: Insufficient documentation

## 2018-06-18 DIAGNOSIS — E039 Hypothyroidism, unspecified: Secondary | ICD-10-CM | POA: Diagnosis not present

## 2018-06-18 DIAGNOSIS — Z79899 Other long term (current) drug therapy: Secondary | ICD-10-CM | POA: Diagnosis not present

## 2018-06-18 DIAGNOSIS — R42 Dizziness and giddiness: Secondary | ICD-10-CM | POA: Diagnosis not present

## 2018-06-18 DIAGNOSIS — Z7982 Long term (current) use of aspirin: Secondary | ICD-10-CM | POA: Insufficient documentation

## 2018-06-18 DIAGNOSIS — H9202 Otalgia, left ear: Secondary | ICD-10-CM | POA: Diagnosis not present

## 2018-06-18 DIAGNOSIS — M549 Dorsalgia, unspecified: Secondary | ICD-10-CM | POA: Diagnosis not present

## 2018-06-18 DIAGNOSIS — I10 Essential (primary) hypertension: Secondary | ICD-10-CM | POA: Insufficient documentation

## 2018-06-18 LAB — CBC
HEMATOCRIT: 35.1 % — AB (ref 36.0–46.0)
Hemoglobin: 11.8 g/dL — ABNORMAL LOW (ref 12.0–15.0)
MCH: 31.8 pg (ref 26.0–34.0)
MCHC: 33.6 g/dL (ref 30.0–36.0)
MCV: 94.6 fL (ref 80.0–100.0)
NRBC: 0 % (ref 0.0–0.2)
PLATELETS: 219 10*3/uL (ref 150–400)
RBC: 3.71 MIL/uL — ABNORMAL LOW (ref 3.87–5.11)
RDW: 12.2 % (ref 11.5–15.5)
WBC: 4.5 10*3/uL (ref 4.0–10.5)

## 2018-06-18 LAB — BASIC METABOLIC PANEL
Anion gap: 10 (ref 5–15)
BUN: 13 mg/dL (ref 8–23)
CO2: 25 mmol/L (ref 22–32)
Calcium: 9 mg/dL (ref 8.9–10.3)
Chloride: 93 mmol/L — ABNORMAL LOW (ref 98–111)
Creatinine, Ser: 0.94 mg/dL (ref 0.44–1.00)
GFR calc non Af Amer: 52 mL/min — ABNORMAL LOW (ref >60)
Glucose, Bld: 194 mg/dL — ABNORMAL HIGH (ref 70–99)
Potassium: 3.6 mmol/L (ref 3.5–5.1)
Sodium: 128 mmol/L — ABNORMAL LOW (ref 135–145)

## 2018-06-18 LAB — URINALYSIS, ROUTINE W REFLEX MICROSCOPIC
BILIRUBIN URINE: NEGATIVE
Glucose, UA: NEGATIVE mg/dL
Hgb urine dipstick: NEGATIVE
Ketones, ur: NEGATIVE mg/dL
Leukocytes,Ua: NEGATIVE
Nitrite: NEGATIVE
Protein, ur: NEGATIVE mg/dL
Specific Gravity, Urine: 1.009 (ref 1.005–1.030)
pH: 6 (ref 5.0–8.0)

## 2018-06-18 MED ORDER — SODIUM CHLORIDE 0.9 % IV BOLUS
1000.0000 mL | Freq: Once | INTRAVENOUS | Status: AC
  Administered 2018-06-18: 1000 mL via INTRAVENOUS

## 2018-06-18 NOTE — ED Notes (Signed)
Patient transported to CT 

## 2018-06-18 NOTE — ED Triage Notes (Signed)
Patient to ED with multiple complaints - reports chronic lower back pain that has recently worsened (no new weakness/numbness). She endorses new onset L ear pain yesterday and vertigo when moving head to the left (states pain and dizziness improved when head turned to the right). Saw PCP Monday and started on cipro for possible UTI, but UA was negative.

## 2018-06-18 NOTE — ED Provider Notes (Signed)
[facility] EMERGENCY DEPARTMENT Provider Note   CSN: [id number] Arrival date & time: 06/18/18  1334    History   Chief Complaint Chief Complaint  Patient presents with  . Chills    HPI Kayla Davis is a 85 y.o. female.      Illness  Location:  Patient presents due to chills which have been the last 2 days.  Patient currently being treated by UTI, intermittent dizziness however no syncopal-like symptoms or neurological symptoms. Severity:  Mild Onset quality:  Gradual Timing:  Intermittent Progression:  Resolved Chronicity:  New Context:  UTI Relieved by:  Nothing Worsened by:  Nothing Associated symptoms: no abdominal pain, no chest pain, no cough, no diarrhea, no ear pain, no fever, no loss of consciousness, no rash, no shortness of breath, no sore throat, no vomiting and no wheezing     Past Medical History:  Diagnosis Date  . Anemia, unspecified   . Anxiety state, unspecified   . Aortic valve disorders   . Bradycardia    s/p PPM  . Bursitis   . Complication of anesthesia    has had difficulty awakening in past; pt states did well with the anesthesia given during her carotid surgery   . Diabetes mellitus without complication ([place])   . Diverticulosis of colon (without mention of hemorrhage)   . Herpes zoster with other nervous system complications(053.19)   . Hyperlipidemia   . Hypertension   . Hypothyroidism   . Irritable bowel syndrome   . Menopause   . Osteoarthrosis, unspecified whether generalized or localized, unspecified site   . Peripheral vascular disease ([place])   . TIA (transient ischemic attack)   . Unspecified hearing loss   . Varicose veins     Patient Active Problem List   Diagnosis Date Noted  . Hematuria 11/20/2017  . Cough 11/20/2017  . Colon cancer screening 08/04/2017  . Abdominal pain 03/13/2017  . Amaurosis 07/03/2016  . Diet-controlled type 2 diabetes mellitus ([place]) 02/29/2016  . Vertigo 12/11/2015  .  Gait disorder 06/27/2015  . OA (osteoarthritis) of hip 03/23/2015  . Preop exam for internal medicine 03/04/2015  . Shortness of breath 02/13/2015  . Rhinitis, atrophic 11/22/2014  . Well adult exam 04/21/2014  . Amaurosis fugax 03/31/2014  . History of CEA (carotid endarterectomy) 03/31/2014  . HLD (hyperlipidemia) 03/31/2014  . Carotid stenosis 02/07/2014  . Cerebral infarction ([place]) 01/16/2014  . Dyslipidemia 01/16/2014  . Weakness generalized 04/30/2013  . CAP (community acquired pneumonia) 04/29/2013  . Edema 03/22/2013  . Urinary incontinence in female 03/22/2013  . Constipation 10/01/2012  . UTI (urinary tract infection) 10/01/2012  . Diarrhea 08/26/2012  . Cerumen impaction 12/11/2011  . Pacemaker-Medtronic 07/30/2010  . SINUS BRADYCARDIA 05/17/2010  . DYSURIA 01/29/2010  . Aortic valve disorders 07/31/2009  . Venous (peripheral) insufficiency 07/31/2009  . Hyponatremia 07/01/2009  . LEG CRAMPS 06/12/2009  . LEG PAIN, BILATERAL 02/01/2009  . Backache 08/24/2008  . Anemia 06/21/2008  . DEGENERATIVE JOINT DISEASE 02/19/2008  . CHEST WALL PAIN, HX OF 02/19/2008  . IRRITABLE BOWEL SYNDROME 12/28/2007  . DIABETES MELLITUS, BORDERLINE 11/06/2007  . GASTRITIS 07/08/2007  . POSTHERPETIC NEURALGIA 07/01/2007  . Herpes zoster without mention of complication 40/34/7425  . Hypothyroidism 06/08/2007  . HYPERLIPIDEMIA 06/08/2007  . Anxiety state 06/08/2007  . Hereditary and idiopathic peripheral neuropathy 06/08/2007  . HEARING LOSS 06/08/2007  . Essential hypertension 06/08/2007  . Transient cerebral ischemia 06/08/2007  . BRONCHITIS, RECURRENT 06/08/2007  . DIVERTICULOSIS OF COLON  06/08/2007  . Osteoporosis 06/08/2007    Past Surgical History:  Procedure Laterality Date  . APPENDECTOMY    . CATARACT EXTRACTION, BILATERAL  end of June 2013  . CHOLECYSTECTOMY    . DILATION AND CURETTAGE OF UTERUS    . ENDARTERECTOMY Right 01/20/2014   Procedure: RIGHT CAROTID ARTERY  ENDARTERECTOMY WITH DACRON PATCH ANGIOPLASTY;  Surgeon: Mal Misty, MD;  Location: [facility];  Service: Vascular;  Laterality: Right;  . PACEMAKER INSERTION     MDT dual chamber PPM implanted by Dr [name] for sinus node dysfunction   . TONSILLECTOMY    . TOTAL HIP ARTHROPLASTY Left 03/23/2015   Procedure: TOTAL HIP ARTHROPLASTY ANTERIOR APPROACH;  Surgeon: Gaynelle Arabian, MD;  Location: [facility];  Service: Orthopedics;  Laterality: Left;  [name] VAGINAL HYSTERECTOMY       OB History   No obstetric history on file.      Home Medications    Prior to Admission medications   Medication Sig Start Date End Date Taking? Authorizing Provider  ALPHA LIPOIC ACID PO Take 1 tablet by mouth daily.    [provider]  aspirin 325 MG tablet Take 325 mg by mouth daily.    [provider]  CRANBERRY PO Take 1 tablet by mouth daily with supper.    [provider]  Cyanocobalamin (VITAMIN B-12 PO) Take 1 tablet by mouth at bedtime.    [provider]  Docusate Calcium (STOOL SOFTENER PO) Take 2 capsules by mouth at bedtime.    [provider]  EVENING PRIMROSE OIL PO Take 1 capsule by mouth daily.    [provider]  fluticasone (FLONASE) 50 MCG/ACT nasal spray Place 2 sprays into both nostrils daily. 01/29/18   Marrian Salvage, FNP  ipratropium (ATROVENT) 0.06 % nasal spray Place 2 sprays into both nostrils 3 (three) times daily. 03/04/18   Plotnikov, Evie Lacks, MD  Lancets [facility] ULTRASOFT) lancets Use to check blood sugars once a day Dx E11.9 09/17/16   Plotnikov, Evie Lacks, MD  levothyroxine (SYNTHROID, LEVOTHROID) 100 MCG tablet TAKE 1 TABLET BY MOUTH EVERY DAY BEFORE BREAKFAST 06/03/18   Plotnikov, Evie Lacks, MD  losartan (COZAAR) 25 MG tablet Take 1 tablet (25 mg total) by mouth 2 (two) times daily. 04/15/18   Plotnikov, Evie Lacks, MD  LYRICA 75 MG capsule TAKE 1 CAPSULE TWICE DAILY 04/06/17   Plotnikov, Evie Lacks, MD  Magnesium 250 MG TABS Take 1  tablet daily by mouth.     [provider]  metoprolol tartrate (LOPRESSOR) 25 MG tablet Take 1 tablet (25 mg total) by mouth 2 (two) times daily. 04/15/18   Plotnikov, Evie Lacks, MD  Multiple Vitamins-Minerals (EYE VITAMINS) CAPS Take 1 capsule by mouth daily.    [provider]  Polyethyl [name] (SYSTANE OP) Apply to eye daily.    [provider]  polyethylene glycol (MIRALAX / GLYCOLAX) packet Take 17 g by mouth daily as needed for mild constipation.    [provider]  simvastatin (ZOCOR) 40 MG tablet Take 1 tablet (40 mg total) by mouth daily at 6 PM. 01/01/18   Plotnikov, Evie Lacks, MD    Family History Family History  Problem Relation Age of Onset  . Heart disease Mother   . Heart disease Father   . Diabetes Father   . Cancer Paternal Grandmother     Social History Social History   Tobacco Use  . Smoking status: Never Smoker  . Smokeless tobacco: Never  Used  Substance Use Topics  . Alcohol use: No    Alcohol/week: 0.0 standard drinks  . Drug use: No     Allergies   Penicillins; Alprazolam; Bactrim [sulfamethoxazole-trimethoprim]; Ceclor [cefaclor]; Cephalexin; Ciprofloxacin; Clindamycin/lincomycin; Hydrocodone; Noroxin [norfloxacin]; Sulfonamide derivatives; Tetracyclines & related; and Tramadol   Review of Systems Review of Systems  Constitutional: Negative for chills and fever.  HENT: Negative for ear pain and sore throat.   Eyes: Negative for pain and visual disturbance.  Respiratory: Negative for cough, shortness of breath and wheezing.   Cardiovascular: Negative for chest pain and palpitations.  Gastrointestinal: Negative for abdominal pain, diarrhea and vomiting.  Genitourinary: Negative for dysuria and hematuria.  Musculoskeletal: Positive for back pain (Chronic). Negative for arthralgias.  Skin: Negative for color change and rash.  Neurological: Positive for dizziness. Negative for seizures, loss of  consciousness and syncope.  All other systems reviewed and are negative.    Physical Exam Updated Vital Signs BP 131/64   Pulse 62   Temp 98.1 F (36.7 C)   Resp (!) 21   LMP  (LMP Unknown)   SpO2 100%   Physical Exam Vitals signs and nursing note reviewed.  Constitutional:      General: She is not in acute distress.    Appearance: She is well-developed.     Comments: Patient resting comfortably, no acute distress.  HENT:     Head: Normocephalic and atraumatic.     Right Ear: Tympanic membrane, ear canal and external ear normal. There is no impacted cerumen.     Left Ear: Tympanic membrane, ear canal and external ear normal. There is no impacted cerumen.     Nose: Nose normal.  Eyes:     Extraocular Movements: Extraocular movements intact.     Conjunctiva/sclera: Conjunctivae normal.     Pupils: Pupils are equal, round, and reactive to light.  Neck:     Musculoskeletal: Neck supple.  Cardiovascular:     Rate and Rhythm: Normal rate and regular rhythm.     Heart sounds: No murmur.  Pulmonary:     Effort: Pulmonary effort is normal. No respiratory distress.     Breath sounds: Normal breath sounds.  Abdominal:     Palpations: Abdomen is soft.     Tenderness: There is no abdominal tenderness.  Musculoskeletal: Normal range of motion.  Skin:    General: Skin is warm and dry.     Capillary Refill: Capillary refill takes less than 2 seconds.  Neurological:     General: No focal deficit present.     Mental Status: She is alert.  Psychiatric:        Mood and Affect: Mood normal.      ED Treatments / Results  Labs (all labs ordered are listed, but only abnormal results are displayed) Labs Reviewed  CBC - Abnormal; Notable for the following components:      Result Value   RBC 3.71 (*)    Hemoglobin 11.8 (*)    HCT 35.1 (*)    All other components within normal limits  BASIC METABOLIC PANEL - Abnormal; Notable for the following components:   Sodium 128 (*)     Chloride 93 (*)    Glucose, Bld 194 (*)    GFR calc non Af Amer 52 (*)    All other components within normal limits  URINE CULTURE  URINALYSIS, ROUTINE W REFLEX MICROSCOPIC    EKG None  Radiology Ct Head Wo Contrast  Result Date: 06/18/2018 CLINICAL DATA:  Left ear pain and vertigo since yesterday. EXAM: CT HEAD WITHOUT CONTRAST TECHNIQUE: Contiguous axial images were obtained from the base of the skull through the vertex without intravenous contrast. COMPARISON:  10/02/2016. FINDINGS: Brain: Stable mild moderate enlargement of the ventricles and subarachnoid spaces. Stable mild-to-moderate patchy white matter low density in both cerebral hemispheres. No intracranial hemorrhage, mass lesion or CT evidence of acute infarction. Again demonstrated is thick pannus posterior to the dens. Vascular: No hyperdense vessel or unexpected calcification. Skull: Normal. Negative for fracture or focal lesion. Sinuses/Orbits: Status post bilateral cataract extraction. Stable mucosal thickening in a right mastoid air cell. The included paranasal sinuses are normally pneumatized. Other: Marked bilateral temporomandibular joint degenerative changes. IMPRESSION: 1. No acute abnormality. 2. Stable atrophy and chronic small vessel white matter ischemic changes. 3. Marked bilateral TMJ degenerative changes. Electronically Signed   By: Claudie Revering M.D.   On: 06/18/2018 17:41    Procedures Procedures (including critical care time)  Medications Ordered in ED Medications  sodium chloride 0.9 % bolus 1,000 mL (0 mLs Intravenous Stopped 06/18/18 1718)     Initial Impression / Assessment and Plan / ED Course  I have reviewed the triage vital signs and the nursing notes.  Pertinent labs & imaging results that were available during my care of the patient were reviewed by me and considered in my medical decision making (see chart for details).        85 year old female significant past medical history of  hypertension, dyslipidemia and other history listed above who presents with " chills" for the past 2 days which have not gotten any better while she is currently being treated by UTI with Cipro which she started on Monday.  Patient denies any objective fever, is afebrile here.  Endorses mild URI-like symptoms however denies any shortness of breath, chest pain, abdominal pain or any other symptoms at this time.  Patient also deals with chronic back pain; for which is worse however relatively at baseline.  Physical exam consistent with clean ears without bulging TMs concerning for bacterial infection.  Normal neurological exam in the setting of patient having subjective dizziness however denies any mobility instability or room spinning or vertigo-like symptoms.  Patient indicates that dizziness might just be her feeling not herself.  Will obtain CT head, basic laboratory studies as well as EKG, chest x-ray and urinalysis.  Patient in agreement with this plan.  EKG similar to prior, no left bundle.  Laboratory studies indicate hyponatremia, however patient does have some hyperglycemia at this time corrected is likely closer to 130 which is patient's baseline.  Patient does drink a lot of water without appropriate sodium supplementation and her primary care doctor has talked to her about this previously.  Patient is going to add Gatorade, electrolyte supplementation as well as add salt to her food.  Patient's complaint of chills likely related to resolving UTI; urinalysis negative.  Other laboratory studies reviewed, found to be appropriate.  Patient reading the newspaper in the room on reassessment, stable vital signs, no complaints.  Based on this assessment will follow-up with primary care provider.  Discharged in stable additional stable vital signs.  The above care was discussed and agreed upon by my attending physician.  Final Clinical Impressions(s) / ED Diagnoses   Final diagnoses:  [place]    ED  Discharge Orders    None       Orson Aloe, MD 06/19/18 3646    Elnora Morrison, MD 06/20/18 0028

## 2018-06-19 LAB — URINE CULTURE: CULTURE: NO GROWTH

## 2018-06-24 ENCOUNTER — Encounter: Payer: Self-pay | Admitting: Internal Medicine

## 2018-06-24 ENCOUNTER — Other Ambulatory Visit: Payer: Self-pay

## 2018-06-24 ENCOUNTER — Ambulatory Visit (INDEPENDENT_AMBULATORY_CARE_PROVIDER_SITE_OTHER): Payer: MEDICARE | Admitting: Internal Medicine

## 2018-06-24 ENCOUNTER — Other Ambulatory Visit (INDEPENDENT_AMBULATORY_CARE_PROVIDER_SITE_OTHER): Payer: MEDICARE

## 2018-06-24 DIAGNOSIS — I10 Essential (primary) hypertension: Secondary | ICD-10-CM | POA: Diagnosis not present

## 2018-06-24 LAB — BASIC METABOLIC PANEL
BUN: 18 mg/dL (ref 6–23)
CO2: 30 mEq/L (ref 19–32)
Calcium: 9.5 mg/dL (ref 8.4–10.5)
Chloride: 94 mEq/L — ABNORMAL LOW (ref 96–112)
Creatinine, Ser: 0.87 mg/dL (ref 0.40–1.20)
GFR: 60.69 mL/min (ref >60.00)
Glucose, Bld: 93 mg/dL (ref 70–99)
Potassium: 4.1 mEq/L (ref 3.5–5.1)
Sodium: 130 mEq/L — ABNORMAL LOW (ref 135–145)

## 2018-06-24 MED ORDER — LOSARTAN POTASSIUM 25 MG PO TABS: 25.0000 mg | ORAL_TABLET | Freq: Every day | ORAL | 3 refills | Status: DC

## 2018-06-24 MED ORDER — LYRICA 75 MG PO CAPS: 75.0000 mg | ORAL_CAPSULE | Freq: Two times a day (BID) | ORAL | 3 refills | Status: DC

## 2018-06-24 MED ORDER — MELOXICAM 7.5 MG PO TABS: ORAL_TABLET | ORAL | 0 refills | Status: DC

## 2018-06-24 NOTE — Patient Instructions (Addendum)
Reduce losartan to 25 mg/day  Drink normal amount of liquids  Take Meloxicam daily x 1 week, then as needed

## 2018-06-24 NOTE — Progress Notes (Signed)
Subjective:  Patient ID: Kayla Davis, female    DOB: 02-May-1922  Age: 85 y.o. MRN: [id number]  CC: No chief complaint on file.   HPI ROBERT SUNGA presents for recent UTI x2 - treated w/Cipro at [facility]. C/o bback pain. No recent chills. C/o L ear pain... F/u low sodium C/o visual hallucinations x 2 in the end of Cipro Rx  Outpatient Medications Prior to Visit  Medication Sig Dispense Refill  . ALPHA LIPOIC ACID PO Take 1 tablet by mouth daily.    [name] aspirin 325 MG tablet Take 325 mg by mouth daily.    [name] CRANBERRY PO Take 1 tablet by mouth daily with supper.    . Cyanocobalamin (VITAMIN B-12 PO) Take 1 tablet by mouth at bedtime.    [name] Calcium (STOOL SOFTENER PO) Take 2 capsules by mouth at bedtime.    [name] EVENING PRIMROSE OIL PO Take 1 capsule by mouth daily.    . fluticasone (FLONASE) 50 MCG/ACT nasal spray Place 2 sprays into both nostrils daily. 16 g 6  . ipratropium (ATROVENT) 0.06 % nasal spray Place 2 sprays into both nostrils 3 (three) times daily. 45 mL 1  . Lancets (ONETOUCH ULTRASOFT) lancets Use to check blood sugars once a day Dx E11.9 100 each 3  . levothyroxine (SYNTHROID, LEVOTHROID) 100 MCG tablet TAKE 1 TABLET BY MOUTH EVERY DAY BEFORE BREAKFAST 90 tablet 1  . losartan (COZAAR) 25 MG tablet Take 1 tablet (25 mg total) by mouth 2 (two) times daily. 180 tablet 3  . LYRICA 75 MG capsule TAKE 1 CAPSULE TWICE DAILY 180 capsule 3  . metoprolol tartrate (LOPRESSOR) 25 MG tablet Take 1 tablet (25 mg total) by mouth 2 (two) times daily. 60 tablet 0  . Multiple Vitamins-Minerals (EYE VITAMINS) CAPS Take 1 capsule by mouth daily.    [name] (SYSTANE OP) Apply to eye daily.    . polyethylene glycol (MIRALAX / GLYCOLAX) packet Take 17 g by mouth daily as needed for mild constipation.    . simvastatin (ZOCOR) 40 MG tablet Take 1 tablet (40 mg total) by mouth daily at 6 PM. 90 tablet 3  . Magnesium 250 MG TABS Take 1 tablet daily by mouth.       No facility-administered medications prior to visit.     ROS: Review of Systems  Constitutional: Positive for chills. Negative for activity change, appetite change, fatigue, fever and unexpected weight change.  HENT: Positive for ear pain. Negative for congestion, mouth sores and sinus pressure.   Eyes: Negative for visual disturbance.  Respiratory: Negative for cough and chest tightness.   Gastrointestinal: Negative for abdominal pain and nausea.  Genitourinary: Negative for difficulty urinating, frequency and vaginal pain.  Musculoskeletal: Positive for back pain. Negative for gait problem.  Skin: Negative for pallor and rash.  Neurological: Negative for dizziness, tremors, weakness, numbness and headaches.  Psychiatric/Behavioral: Negative for confusion, sleep disturbance and suicidal ideas. The patient is nervous/anxious.     Objective:  BP 112/72 (BP Location: Right Arm, Patient Position: Sitting, Cuff Size: Normal)   Pulse 73   Temp (!) 97.5 F (36.4 C) (Oral)   Ht 5\' 5"  (1.651 m)   Wt 156 lb (70.8 kg)   LMP  (LMP Unknown)   SpO2 96%   BMI 25.96 kg/m   BP Readings from Last 3 Encounters:  06/24/18 112/72  06/18/18 131/64  04/16/18 108/68    Wt Readings from Last 3 Encounters:  06/24/18 156  lb (70.8 kg)  04/16/18 158 lb (71.7 kg)  04/15/18 156 lb (70.8 kg)    Physical Exam Constitutional:      General: She is not in acute distress.    Appearance: She is well-developed.  HENT:     Head: Normocephalic.     Right Ear: External ear normal.     Left Ear: External ear normal.     Nose: Nose normal.  Eyes:     General:        Right eye: No discharge.        Left eye: No discharge.     Conjunctiva/sclera: Conjunctivae normal.     Pupils: Pupils are equal, round, and reactive to light.  Neck:     Musculoskeletal: Normal range of motion and neck supple.     Thyroid: No thyromegaly.     Vascular: No JVD.     Trachea: No tracheal deviation.  Cardiovascular:      Rate and Rhythm: Normal rate and regular rhythm.     Heart sounds: Normal heart sounds.  Pulmonary:     Effort: No respiratory distress.     Breath sounds: No stridor. No wheezing.  Abdominal:     General: Bowel sounds are normal. There is no distension.     Palpations: Abdomen is soft. There is no mass.     Tenderness: There is no abdominal tenderness. There is no guarding or rebound.  Musculoskeletal:        General: Tenderness present.  Lymphadenopathy:     Cervical: No cervical adenopathy.  Skin:    Findings: No erythema or rash.  Neurological:     Cranial Nerves: No cranial nerve deficit.     Motor: No abnormal muscle tone.     Coordination: Coordination normal.     Deep Tendon Reflexes: Reflexes normal.  Psychiatric:        Behavior: Behavior normal.        Thought Content: Thought content normal.        Judgment: Judgment normal.     Lab Results  Component Value Date   WBC 4.5 06/18/2018   HGB 11.8 (L) 06/18/2018   HCT 35.1 (L) 06/18/2018   PLT 219 06/18/2018   GLUCOSE 194 (H) 06/18/2018   CHOL 162 12/24/2016   TRIG 97.0 12/24/2016   HDL 80.70 12/24/2016   LDLDIRECT 143.0 10/07/2007   LDLCALC 62 12/24/2016   ALT 12 11/20/2017   AST 15 11/20/2017   NA 128 (L) 06/18/2018   K 3.6 06/18/2018   CL 93 (L) 06/18/2018   CREATININE 0.94 06/18/2018   BUN 13 06/18/2018   CO2 25 06/18/2018   TSH 10.14 (H) 04/15/2018   INR 1.1 (H) 11/20/2017   HGBA1C 6.5 04/15/2018    Ct Head Wo Contrast  Result Date: 06/18/2018 CLINICAL DATA:  Left ear pain and vertigo since yesterday. EXAM: CT HEAD WITHOUT CONTRAST TECHNIQUE: Contiguous axial images were obtained from the base of the skull through the vertex without intravenous contrast. COMPARISON:  10/02/2016. FINDINGS: Brain: Stable mild moderate enlargement of the ventricles and subarachnoid spaces. Stable mild-to-moderate patchy white matter low density in both cerebral hemispheres. No intracranial hemorrhage, mass lesion or CT  evidence of acute infarction. Again demonstrated is thick pannus posterior to the dens. Vascular: No hyperdense vessel or unexpected calcification. Skull: Normal. Negative for fracture or focal lesion. Sinuses/Orbits: Status post bilateral cataract extraction. Stable mucosal thickening in a right mastoid air cell. The included paranasal sinuses are normally pneumatized. Other: Marked  bilateral temporomandibular joint degenerative changes. IMPRESSION: 1. No acute abnormality. 2. Stable atrophy and chronic small vessel white matter ischemic changes. 3. Marked bilateral TMJ degenerative changes. Electronically Signed   By: Claudie Revering M.D.   On: 06/18/2018 17:41    Assessment & Plan:   There are no diagnoses linked to this encounter.   No orders of the defined types were placed in this encounter.    Follow-up: No follow-ups on file.  [name], MD

## 2018-07-10 ENCOUNTER — Telehealth: Payer: Self-pay | Admitting: Internal Medicine

## 2018-07-10 NOTE — Telephone Encounter (Signed)
[facility] called requesting additional information regarding this patient.  Specific information was not noted.

## 2018-07-15 NOTE — Telephone Encounter (Signed)
Faxed filled or and faxed back

## 2018-07-17 ENCOUNTER — Other Ambulatory Visit: Payer: Self-pay | Admitting: Internal Medicine

## 2018-07-17 MED ORDER — LYRICA 75 MG PO CAPS: 75.0000 mg | ORAL_CAPSULE | Freq: Two times a day (BID) | ORAL | 3 refills | Status: DC

## 2018-07-20 ENCOUNTER — Telehealth: Payer: Self-pay | Admitting: Internal Medicine

## 2018-07-20 ENCOUNTER — Other Ambulatory Visit: Payer: Self-pay

## 2018-07-20 ENCOUNTER — Ambulatory Visit (INDEPENDENT_AMBULATORY_CARE_PROVIDER_SITE_OTHER): Payer: MEDICARE | Admitting: *Deleted

## 2018-07-20 DIAGNOSIS — R55 Syncope and collapse: Secondary | ICD-10-CM

## 2018-07-20 DIAGNOSIS — I495 Sick sinus syndrome: Secondary | ICD-10-CM | POA: Diagnosis not present

## 2018-07-20 MED ORDER — PREGABALIN 75 MG PO CAPS: 75.0000 mg | ORAL_CAPSULE | Freq: Two times a day (BID) | ORAL | 3 refills | Status: DC

## 2018-07-20 MED ORDER — GLUCOSE BLOOD VI STRP: ORAL_STRIP | 12 refills | Status: DC

## 2018-07-20 NOTE — Telephone Encounter (Signed)
RXs sent.

## 2018-07-20 NOTE — Telephone Encounter (Signed)
Copied from [place] [id number]. Topic: Quick Communication - Rx Refill/Question >> Jul 20, 2018  1:36 PM Scherrie Gerlach wrote: Medication: pregabalin  (lyrica) 75 mg Pt takes BID Pt would like the generic for her Rx LYRICA 75 MG capsule [id number] [place], [place] [address]. AT [address] [place] [phone] (Phone) [phone] (Fax)  Pt states she is out and cannot sleep due to her pain. Pt was hoping the rx had been switched to generic.  Would like to know if you can have this switched from the [facility] that was sent in. (RX states printed)  ALSO Pt requesting ONETOUCH ULTRA test strips.  Pt test 2 times a day. [facility]

## 2018-07-21 LAB — CUP PACEART REMOTE DEVICE CHECK
Battery Impedance: 923 Ohm
Battery Remaining Longevity: 65 mo
Battery Voltage: 2.77 V
Brady Statistic AP VP Percent: 2 %
Brady Statistic AP VS Percent: 93 %
Brady Statistic AS VP Percent: 0 %
Brady Statistic AS VS Percent: 4 %
Date Time Interrogation Session: 20200408002057
Implantable Lead Implant Date: 20111031
Implantable Lead Implant Date: 20111031
Implantable Lead Location: 753859
Implantable Lead Location: 753860
Implantable Lead Model: 4469
Implantable Lead Model: 4470
Implantable Lead Serial Number: 548557
Implantable Lead Serial Number: 686076
Implantable Pulse Generator Implant Date: 20111031
Lead Channel Impedance Value: 486 Ohm
Lead Channel Impedance Value: 519 Ohm
Lead Channel Pacing Threshold Amplitude: 0.5 V
Lead Channel Pacing Threshold Amplitude: 0.75 V
Lead Channel Pacing Threshold Pulse Width: 0.4 ms
Lead Channel Pacing Threshold Pulse Width: 0.4 ms
Lead Channel Setting Pacing Amplitude: 2 V
Lead Channel Setting Pacing Amplitude: 2.5 V
Lead Channel Setting Pacing Pulse Width: 0.4 ms
Lead Channel Setting Sensing Sensitivity: 4 mV

## 2018-07-22 ENCOUNTER — Telehealth: Payer: Self-pay | Admitting: Internal Medicine

## 2018-07-22 NOTE — Telephone Encounter (Signed)
Copied from [place] [id number]. Topic: Quick Communication - See Telephone Encounter >> Jul 22, 2018  4:45 PM Blase Mess A wrote: CRM for notification. See Telephone encounter for: 07/22/18.  Patient is calling stating that the pharmacy does not know how much to charge the patient for the pregabalin (LYRICA) 75 MG capsule [[id number]] Please advise. [id number] [place] [place] [address] AT [address] [phone] (Phone) [phone] (Fax  Thank you

## 2018-07-22 NOTE — Telephone Encounter (Signed)
Meredith from [facility] coverage determination left a vm about request  for brand name lyrica. In order for this to be covered need to address pt's dx and formulary alternatives. They stated they have sent several faxes to [phone] and have not received a response back and now no more time left to make a decision,so request has been denied due to lack of information.   Call back number [phone] reference # [id number]

## 2018-07-28 ENCOUNTER — Ambulatory Visit: Payer: MEDICARE | Admitting: Internal Medicine

## 2018-07-29 NOTE — Progress Notes (Signed)
Remote pacemaker transmission.   

## 2018-08-04 ENCOUNTER — Telehealth: Payer: Self-pay | Admitting: Internal Medicine

## 2018-08-04 ENCOUNTER — Other Ambulatory Visit: Payer: Self-pay

## 2018-08-04 MED ORDER — PREGABALIN 75 MG PO CAPS: 75.0000 mg | ORAL_CAPSULE | Freq: Two times a day (BID) | ORAL | 3 refills | Status: DC

## 2018-08-04 NOTE — Telephone Encounter (Signed)
Patients son called and stated that the office needs to call [facility] immediately because the pharmacy told the patient that until this is done the patient can not get her Lyrica prescription filled. The patient and her son were told that the pharmacy has faxed multiple requests to the practice and gotten no response. The son would like a call back when this has been completed because they want to be able to get the medication as soon as possible.

## 2018-08-04 NOTE — Telephone Encounter (Signed)
LM with son to call back, RX was sent to local pharmacy for a year supply per patients request on 07/20/18. Does this need to be sent to mail order? PA was approved for this medication

## 2018-08-05 NOTE — Telephone Encounter (Signed)
See other TEs.

## 2018-08-09 ENCOUNTER — Other Ambulatory Visit: Payer: Self-pay | Admitting: Internal Medicine

## 2018-08-13 ENCOUNTER — Other Ambulatory Visit: Payer: Self-pay | Admitting: Internal Medicine

## 2018-08-17 ENCOUNTER — Telehealth: Payer: Self-pay | Admitting: Internal Medicine

## 2018-08-17 NOTE — Telephone Encounter (Signed)
Copied from [place] [id number]. Topic: Quick Communication - Rx Refill/Question >> Aug 17, 2018 10:40 AM Waylan Rocher, Lumin L wrote: Medication: hydrochlorothiazide (MICROZIDE) 12.5 MG capsule (says Dr. Alain Marion prescribed this before and wants to know what do because because she is out)  Has the patient contacted their pharmacy? {yes  (Agent: If no, request that the patient contact the pharmacy for the refill.) (Agent: If yes, when and what did the pharmacy advise?)  Preferred Pharmacy (with phone number or street name): [id number] - [place], [place]. AT [address]. [place] [address] Phone: [phone] Fax: [phone]  Agent: Please be advised that RX refills may take up to 3 business days. We ask that you follow-up with your pharmacy.

## 2018-08-17 NOTE — Telephone Encounter (Signed)
I do not see where you prescribed this medication but I do see where you discontinued it. Please advise

## 2018-08-18 MED ORDER — HYDROCHLOROTHIAZIDE 12.5 MG PO CAPS: 12.5000 mg | ORAL_CAPSULE | Freq: Every day | ORAL | 3 refills | Status: DC

## 2018-08-18 NOTE — Telephone Encounter (Signed)
Ok Thx 

## 2018-08-19 ENCOUNTER — Other Ambulatory Visit: Payer: Self-pay

## 2018-08-19 ENCOUNTER — Telehealth: Payer: Self-pay | Admitting: Internal Medicine

## 2018-08-19 MED ORDER — ONETOUCH ULTRASOFT LANCETS MISC: 3 refills | Status: DC

## 2018-08-19 MED ORDER — ONETOUCH ULTRASOFT LANCETS MISC: 3 refills | Status: AC

## 2018-08-19 MED ORDER — HYDROCHLOROTHIAZIDE 12.5 MG PO CAPS: 12.5000 mg | ORAL_CAPSULE | Freq: Every day | ORAL | 0 refills | Status: DC

## 2018-08-19 NOTE — Telephone Encounter (Signed)
Copied from [place] [id number]. Topic: General - Other >> Aug 19, 2018  9:01 AM Antonieta Iba C wrote: Reason for CRM: pt's daughter called in because she was advised by pharmacy to request a "bridge" supply of medication for hydrochlorothiazide (MICROZIDE) 12.5 MG capsule until pt receives her Rx in the mail via mail order.  Daughter says that pt will be out tomorrow.    Pharmacy: [id number] - [place], [place] [address]. AT [address] [phone] (Phone) [phone] (Fax)  Daughter would like a call back when Rx has been sent in if possible.

## 2018-08-20 NOTE — Telephone Encounter (Signed)
Spoke with patient's daughter today Ms. Zack Seal and info given that script had been faxed to pharmacy yesterday morning.

## 2018-09-07 ENCOUNTER — Ambulatory Visit: Payer: MEDICARE | Admitting: Internal Medicine

## 2018-09-20 MED ORDER — METOPROLOL TARTRATE 25 MG PO TABS: 25.0000 mg | ORAL_TABLET | Freq: Two times a day (BID) | ORAL | 1 refills | Status: DC

## 2018-09-26 ENCOUNTER — Other Ambulatory Visit: Payer: Self-pay | Admitting: Internal Medicine

## 2018-09-26 DIAGNOSIS — G629 Polyneuropathy, unspecified: Secondary | ICD-10-CM

## 2018-09-27 DIAGNOSIS — I517 Cardiomegaly: Secondary | ICD-10-CM | POA: Diagnosis not present

## 2018-09-27 DIAGNOSIS — Z20828 Contact with and (suspected) exposure to other viral communicable diseases: Secondary | ICD-10-CM | POA: Diagnosis not present

## 2018-09-27 DIAGNOSIS — R300 Dysuria: Secondary | ICD-10-CM | POA: Diagnosis not present

## 2018-09-27 DIAGNOSIS — H6121 Impacted cerumen, right ear: Secondary | ICD-10-CM | POA: Diagnosis not present

## 2018-09-27 DIAGNOSIS — R05 Cough: Secondary | ICD-10-CM | POA: Diagnosis not present

## 2018-09-27 DIAGNOSIS — R509 Fever, unspecified: Secondary | ICD-10-CM | POA: Diagnosis not present

## 2018-10-01 ENCOUNTER — Encounter: Payer: Self-pay | Admitting: Internal Medicine

## 2018-10-18 DIAGNOSIS — N3000 Acute cystitis without hematuria: Secondary | ICD-10-CM | POA: Diagnosis not present

## 2018-10-19 ENCOUNTER — Ambulatory Visit (INDEPENDENT_AMBULATORY_CARE_PROVIDER_SITE_OTHER): Payer: MEDICARE | Admitting: *Deleted

## 2018-10-19 DIAGNOSIS — I495 Sick sinus syndrome: Secondary | ICD-10-CM

## 2018-10-20 LAB — CUP PACEART REMOTE DEVICE CHECK
Battery Impedance: 1052 Ohm
Battery Remaining Longevity: 60 mo
Battery Voltage: 2.77 V
Brady Statistic AP VP Percent: 3 %
Brady Statistic AP VS Percent: 93 %
Brady Statistic AS VP Percent: 1 %
Brady Statistic AS VS Percent: 3 %
Date Time Interrogation Session: 20200708001000
Implantable Lead Implant Date: 20111031
Implantable Lead Implant Date: 20111031
Implantable Lead Location: 753859
Implantable Lead Location: 753860
Implantable Lead Model: 4469
Implantable Lead Model: 4470
Implantable Lead Serial Number: 548557
Implantable Lead Serial Number: 686076
Implantable Pulse Generator Implant Date: 20111031
Lead Channel Impedance Value: 479 Ohm
Lead Channel Impedance Value: 538 Ohm
Lead Channel Pacing Threshold Amplitude: 0.625 V
Lead Channel Pacing Threshold Amplitude: 0.75 V
Lead Channel Pacing Threshold Pulse Width: 0.4 ms
Lead Channel Pacing Threshold Pulse Width: 0.4 ms
Lead Channel Setting Pacing Amplitude: 2 V
Lead Channel Setting Pacing Amplitude: 2.5 V
Lead Channel Setting Pacing Pulse Width: 0.4 ms
Lead Channel Setting Sensing Sensitivity: 4 mV

## 2018-10-21 ENCOUNTER — Encounter: Payer: Self-pay | Admitting: Neurology

## 2018-10-22 ENCOUNTER — Other Ambulatory Visit: Payer: Self-pay

## 2018-10-22 ENCOUNTER — Telehealth (INDEPENDENT_AMBULATORY_CARE_PROVIDER_SITE_OTHER): Payer: MEDICARE | Admitting: Neurology

## 2018-10-22 DIAGNOSIS — G609 Hereditary and idiopathic neuropathy, unspecified: Secondary | ICD-10-CM

## 2018-10-22 MED ORDER — PREGABALIN 50 MG PO CAPS: 50.0000 mg | ORAL_CAPSULE | Freq: Two times a day (BID) | ORAL | 3 refills | Status: DC

## 2018-10-22 NOTE — Progress Notes (Signed)
Follow up scheduled

## 2018-10-22 NOTE — Progress Notes (Signed)
New Patient Virtual Visit via Video Note The purpose of this virtual visit is to provide medical care while limiting exposure to the novel coronavirus.    Consent was obtained for video visit:  Yes.   Answered questions that patient had about telehealth interaction:  Yes.   I discussed the limitations, risks, security and privacy concerns of performing an evaluation and management service by telemedicine. I also discussed with the patient that there may be a patient responsible charge related to this service. The patient expressed understanding and agreed to proceed.  Pt location: Home Physician Location: office Name of referring provider:  Plotnikov, Evie Lacks, MD I connected with Kayla Davis at patients initiation/request on 10/22/2018 at 11:10 AM EDT by video enabled telemedicine application and verified that I am speaking with the correct person using two identifiers. Pt MRN:  [id number] Pt DOB:  1922/08/10 Video Participants:  Kayla Davis;  Yvone Neu (son)    History of Present Illness: Kayla Davis is a 85 y.o. female with hyperlipidemia, hypertension, bradycardia s/p PPM, diabetes (not on medications), and TIA manifesting with left amaurosis fugax (2018) presenting for evaluation of neuropathy.  She has had a long history of neuropathy and was diagnosed by Dr. Erling Cruz 10- 15 years ago.  She describes painful burning and stinging sensation of the feet and has been on pregabalin 75 mg at bedtime for the past 9 years.  Sometimes, her left great toe can become red and swollen and be exquisitely tender.  She has tried applying Aspercreme with no relief.  Tylenol tends to help.  She has tried taking (2) 75 mg tablets but developed cognitive side effects and grogginess the following day.  She has some imbalance and uses a walker around her home.  Her son lives with her during the week and returns to his home on the weekends, however, with the [place] pandemic he has been staying with her  all the time as he works from home.  She manages her own medications, meals, bathing, and dressing.  She has not had any falls.  She does not have numbness/tingling in the hands or weakness.    Out-side paper records, electronic medical record, and images have been reviewed where available and summarized as:  Lab Results  Component Value Date   HGBA1C 6.5 04/15/2018   Lab Results  Component Value Date   VITAMINB12 1,264 (H) 08/12/2013   Lab Results  Component Value Date   TSH 10.14 (H) 04/15/2018   Lab Results  Component Value Date   ESRSEDRATE 19 11/20/2017    Past Medical History:  Diagnosis Date  . Anemia, unspecified   . Anxiety state, unspecified   . Aortic valve disorders   . Bradycardia    s/p PPM  . Bursitis   . Complication of anesthesia    has had difficulty awakening in past; pt states did well with the anesthesia given during her carotid surgery   . Diabetes mellitus without complication ([place])   . Diverticulosis of colon (without mention of hemorrhage)   . Herpes zoster with other nervous system complications(053.19)   . Hyperlipidemia   . Hypertension   . Hypothyroidism   . Irritable bowel syndrome   . Menopause   . Osteoarthrosis, unspecified whether generalized or localized, unspecified site   . Peripheral vascular disease ([place])   . TIA (transient ischemic attack)   . Unspecified hearing loss   . Varicose veins     Past Surgical History:  Procedure Laterality  Date  . APPENDECTOMY    . CATARACT EXTRACTION, BILATERAL  end of June 2013  . CHOLECYSTECTOMY    . DILATION AND CURETTAGE OF UTERUS    . ENDARTERECTOMY Right 01/20/2014   Procedure: RIGHT CAROTID ARTERY ENDARTERECTOMY WITH DACRON PATCH ANGIOPLASTY;  Surgeon: Mal Misty, MD;  Location: [facility];  Service: Vascular;  Laterality: Right;  . PACEMAKER INSERTION     MDT dual chamber PPM implanted by Dr [name] for sinus node dysfunction   . TONSILLECTOMY    . TOTAL HIP ARTHROPLASTY Left 03/23/2015    Procedure: TOTAL HIP ARTHROPLASTY ANTERIOR APPROACH;  Surgeon: Gaynelle Arabian, MD;  [facility];  Service: Orthopedics;  Laterality: Left;  [name] VAGINAL HYSTERECTOMY       Medications:  Outpatient Encounter Medications as of 10/22/2018  Medication Sig  . ALPHA LIPOIC ACID PO Take 1 tablet by mouth daily.  [name] aspirin 325 MG tablet Take 325 mg by mouth daily.  [name] CRANBERRY PO Take 1 tablet by mouth daily with supper.  . Cyanocobalamin (VITAMIN B-12 PO) Take 1 tablet by mouth at bedtime.  . diphenhydrAMINE (BENADRYL) 25 MG tablet Take 25 mg by mouth as needed.  [name] Calcium (STOOL SOFTENER PO) Take 2 capsules by mouth at bedtime.  [name] EVENING PRIMROSE OIL PO Take 1 capsule by mouth daily.  . fluticasone (FLONASE) 50 MCG/ACT nasal spray Place 2 sprays into both nostrils daily.  [name] glucose blood (ONE TOUCH ULTRA TEST) test strip TEST ONCE DAILY AS DIRECTED DX E11.9  . hydrochlorothiazide (MICROZIDE) 12.5 MG capsule Take 1 capsule (12.5 mg total) by mouth daily.  [name] ipratropium (ATROVENT) 0.06 % nasal spray Place 2 sprays into both nostrils 3 (three) times daily.  . Lancets (ONETOUCH ULTRASOFT) lancets Use to check blood sugars once a day Dx E11.9  . levothyroxine (SYNTHROID, LEVOTHROID) 100 MCG tablet TAKE 1 TABLET BY MOUTH EVERY DAY BEFORE BREAKFAST  . losartan (COZAAR) 25 MG tablet Take 1 tablet (25 mg total) by mouth daily.  . meloxicam (MOBIC) 7.5 MG tablet TAKE 1 TABLET BY MOUTH EVERY DAY FOR 1 WEEK, THEN TAKE AS NEEDED.  . metoprolol tartrate (LOPRESSOR) 25 MG tablet Take 1 tablet (25 mg total) by mouth 2 (two) times daily.  . Multiple Vitamins-Minerals (EYE VITAMINS) CAPS Take 1 capsule by mouth daily.  [name] (SYSTANE OP) Apply to eye daily.  . polyethylene glycol (MIRALAX / GLYCOLAX) packet Take 17 g by mouth daily as needed for mild constipation.  . pregabalin (LYRICA) 75 MG capsule Take 1 capsule (75 mg total) by mouth 2 (two) times daily.  . simvastatin  (ZOCOR) 40 MG tablet Take 1 tablet (40 mg total) by mouth daily at 6 PM.   No facility-administered encounter medications on file as of 10/22/2018.     Allergies:  Allergies  Allergen Reactions  . Penicillins Swelling and Other (See Comments)    BODY SWELLS & PT STATES THAT SHE ALMOST DIED Has patient had a PCN reaction causing immediate rash, facial/tongue/throat swelling, SOB or lightheadedness with hypotension: Yes Has patient had a PCN reaction causing severe rash involving mucus membranes or skin necrosis: known Has patient had a PCN reaction that required hospitalization Yes Has patient had a PCN reaction occurring within the last 10 years: No If all of the above answers are "NO", then may proceed with Cephalosporin u  . Alprazolam Swelling    EYES SWELL  . Bactrim [Sulfamethoxazole-Trimethoprim]     Unable to remember the side  effects  . Ceclor [Cefaclor]     Unsure of the side effects  . Cephalexin     Unsure of the side effects  . Ciprofloxacin Itching and Swelling    Pt states that she tolerated po cipro well, but not the higher dose given IV  . Clindamycin/Lincomycin Nausea Only    Upset stomach  . Hydrocodone     Unsure of reaction to med  . Noroxin [Norfloxacin]     Unsure of side effects  . Sulfonamide Derivatives Itching  . Tetracyclines & Related     Unsure of the reaction to this medication  . Tramadol Other (See Comments)    Did not like how she "felt"    Family History: Family History  Problem Relation Age of Onset  . Heart disease Mother   . Heart disease Father   . Diabetes Father   . Cancer Paternal Grandmother     Social History: Social History   Tobacco Use  . Smoking status: Never Smoker  . Smokeless tobacco: Never Used  Substance Use Topics  . Alcohol use: No    Alcohol/week: 0.0 standard drinks  . Drug use: No   Social History   Social History Narrative   Patient is widowed with 4 children. Son stays with patient at night,  08/11/16.   Patient is right handed.   Patient has college education.   Patient drinks 2 cups daily.    Review of Systems:  CONSTITUTIONAL: No fevers, chills, night sweats, or weight loss.   EYES: No visual changes or eye pain ENT: No hearing changes.  No history of nose bleeds.   RESPIRATORY: No cough, wheezing and shortness of breath.   CARDIOVASCULAR: Negative for chest pain, and palpitations.   GI: Negative for abdominal discomfort, blood in stools or black stools.  No recent change in bowel habits.   GU:  No history of incontinence.   MUSCLOSKELETAL: +history of joint pain or swelling.  No myalgias.   SKIN: Negative for lesions, rash, and itching.   HEMATOLOGY/ONCOLOGY: Negative for prolonged bleeding, bruising easily, and swollen nodes.  No history of cancer.   ENDOCRINE: Negative for cold or heat intolerance, polydipsia or goiter.   PSYCH:  No depression or anxiety symptoms.   NEURO: As Above.   Vital Signs:  LMP  (LMP Unknown)    General Medical Exam:  Well appearing, comfortable.  Nonlabored breathing.   Neurological Exam: MENTAL STATUS including orientation to time, place, person, recent and remote memory, attention span and concentration, language, and fund of knowledge is normal.  Speech is not dysarthric.  CRANIAL NERVES:   No ptosis.  Normal facial symmetry and movements.  Normal shoulder shrug and head rotation.    MOTOR:  Antigravity in all extremities.  No abnormal movements.  No pronator drift.   SENSORY & REFLEXES:  Unable to assess    COORDINATION/GAIT: Gait is stooped and assisted with a walker, wide-based and stable.  IMPRESSION/PLAN: Painful neuropathy of the feet, possibly related to diabetes and thyroid disease, however with her advanced age this is most likely idiopathic.  - Increase pregabalin to 100mg  at bedtime  - Consider gabapentin going forward, if I am unable to titrate pregabalin to a therapeutic dose  - Fall precautions were discussed, she  is compliant with using her walker  - Always use a shower chair nightlights.  - Call with update in 2-3 weeks  Advanced Age.  Remarkably independent given her age    Follow Up Instructions:  I discussed the assessment and treatment plan with the patient. The patient was provided an opportunity to ask questions and all were answered. The patient agreed with the plan and demonstrated an understanding of the instructions.   The patient was advised to call back or seek an in-person evaluation if the symptoms worsen or if the condition fails to improve as anticipated.  Return to clinic in 3 months  Total Time spent:  45 min   [place], DO

## 2018-10-31 ENCOUNTER — Encounter: Payer: Self-pay | Admitting: Cardiology

## 2018-10-31 NOTE — Progress Notes (Signed)
Remote pacemaker transmission.   

## 2018-11-18 ENCOUNTER — Other Ambulatory Visit: Payer: Self-pay | Admitting: Internal Medicine

## 2018-12-15 ENCOUNTER — Telehealth: Payer: Self-pay | Admitting: Internal Medicine

## 2018-12-15 DIAGNOSIS — R300 Dysuria: Secondary | ICD-10-CM

## 2018-12-15 NOTE — Telephone Encounter (Signed)
Copied from [place] [id number]. Topic: General - Call Back - No Documentation >> Dec 15, 2018  1:52 PM Erick Blinks wrote: Reason for CRM: Pt has questions concerning flu shots, requesting nurse call back.  Best contact: [phone]

## 2018-12-16 NOTE — Telephone Encounter (Signed)
I called pt- no answer. Left message for patient to call back to schedule nurse visit for flu vaccine.

## 2018-12-16 NOTE — Telephone Encounter (Signed)
Patient is returning a call to [place].  She would like to speak with her about the flu shot and whether her transportation person can also get a shot when he brings her to the appt.  Please call patient back at [phone]

## 2018-12-17 NOTE — Telephone Encounter (Addendum)
Patient wanted to know if her son who is not a [facility] patient can have his flu vaccine here also. I informed her that he will have to go to his PCP or he can go to almost any pharmacy.   She also wants to drop off a urine specimen. She c/o urinary frequency and some dysuria. Flu vaccine schedule 12/24/18.

## 2018-12-24 ENCOUNTER — Other Ambulatory Visit (INDEPENDENT_AMBULATORY_CARE_PROVIDER_SITE_OTHER): Payer: MEDICARE

## 2018-12-24 ENCOUNTER — Ambulatory Visit (INDEPENDENT_AMBULATORY_CARE_PROVIDER_SITE_OTHER): Payer: MEDICARE

## 2018-12-24 DIAGNOSIS — Z23 Encounter for immunization: Secondary | ICD-10-CM

## 2018-12-24 DIAGNOSIS — R300 Dysuria: Secondary | ICD-10-CM

## 2018-12-24 LAB — URINALYSIS, ROUTINE W REFLEX MICROSCOPIC
Bilirubin Urine: NEGATIVE
Ketones, ur: NEGATIVE
Nitrite: POSITIVE — AB
Specific Gravity, Urine: 1.01 (ref 1.000–1.030)
Total Protein, Urine: NEGATIVE
Urine Glucose: NEGATIVE
Urobilinogen, UA: 0.2 (ref 0.0–1.0)
pH: 7 (ref 5.0–8.0)

## 2018-12-24 NOTE — Telephone Encounter (Signed)
Order entered for UA and Culture. Sent to lab.

## 2018-12-24 NOTE — Addendum Note (Signed)
Addended by: Aviva Signs M on: 12/24/2018 01:37 PM   Modules accepted: Orders

## 2018-12-26 LAB — CULTURE, URINE COMPREHENSIVE
MICRO NUMBER:: 871631
SPECIMEN QUALITY:: ADEQUATE

## 2018-12-30 ENCOUNTER — Other Ambulatory Visit: Payer: Self-pay | Admitting: Internal Medicine

## 2018-12-30 MED ORDER — NITROFURANTOIN MONOHYD MACRO 100 MG PO CAPS: 100.0000 mg | ORAL_CAPSULE | Freq: Two times a day (BID) | ORAL | 0 refills | Status: AC

## 2019-01-04 ENCOUNTER — Other Ambulatory Visit: Payer: Self-pay | Admitting: Internal Medicine

## 2019-01-18 ENCOUNTER — Ambulatory Visit: Payer: MEDICARE | Admitting: *Deleted

## 2019-01-19 LAB — CUP PACEART REMOTE DEVICE CHECK
Battery Impedance: 1158 Ohm
Battery Remaining Longevity: 55 mo
Battery Voltage: 2.77 V
Brady Statistic AP VP Percent: 3 %
Brady Statistic AP VS Percent: 93 %
Brady Statistic AS VP Percent: 1 %
Brady Statistic AS VS Percent: 3 %
Date Time Interrogation Session: 20201007001840
Implantable Lead Implant Date: 20111031
Implantable Lead Implant Date: 20111031
Implantable Lead Location: 753859
Implantable Lead Location: 753860
Implantable Lead Model: 4469
Implantable Lead Model: 4470
Implantable Lead Serial Number: 548557
Implantable Lead Serial Number: 686076
Implantable Pulse Generator Implant Date: 20111031
Lead Channel Impedance Value: 454 Ohm
Lead Channel Impedance Value: 470 Ohm
Lead Channel Pacing Threshold Amplitude: 0.5 V
Lead Channel Pacing Threshold Amplitude: 0.75 V
Lead Channel Pacing Threshold Pulse Width: 0.4 ms
Lead Channel Pacing Threshold Pulse Width: 0.4 ms
Lead Channel Setting Pacing Amplitude: 2 V
Lead Channel Setting Pacing Amplitude: 2.5 V
Lead Channel Setting Pacing Pulse Width: 0.4 ms
Lead Channel Setting Sensing Sensitivity: 5.6 mV

## 2019-01-20 ENCOUNTER — Telehealth: Payer: Self-pay

## 2019-01-20 NOTE — Telephone Encounter (Signed)
Spoke with patient. Explained pacemaker function. She has been having increased fatigue and exercise intolerance. I wonder if it is 2/2 deconditioning. Have encouraged her to gradually increase activity. She would very much like to have a phone visit with Dr [name] - scheduled for next Friday at 1:45PM.  Chanetta Marshall, NP 01/20/2019 3:49 PM

## 2019-01-20 NOTE — Telephone Encounter (Signed)
Pt wants to know how much battery life she has left. I told her she has 4.5 years left. She also would like to know if she uses her pacemaker everyday? I told her I would have a nurse take a look at it and give her a call back. The best number for the pt is [phone]

## 2019-01-28 ENCOUNTER — Telehealth: Payer: Self-pay

## 2019-01-28 ENCOUNTER — Telehealth (INDEPENDENT_AMBULATORY_CARE_PROVIDER_SITE_OTHER): Payer: MEDICARE | Admitting: Neurology

## 2019-01-28 ENCOUNTER — Encounter: Payer: Self-pay | Admitting: Internal Medicine

## 2019-01-28 ENCOUNTER — Telehealth (INDEPENDENT_AMBULATORY_CARE_PROVIDER_SITE_OTHER): Payer: MEDICARE | Admitting: Internal Medicine

## 2019-01-28 ENCOUNTER — Other Ambulatory Visit: Payer: Self-pay

## 2019-01-28 VITALS — BP 156/93 | HR 77 | Ht 65.0 in | Wt 150.0 lb

## 2019-01-28 DIAGNOSIS — M79675 Pain in left toe(s): Secondary | ICD-10-CM

## 2019-01-28 DIAGNOSIS — I10 Essential (primary) hypertension: Secondary | ICD-10-CM | POA: Diagnosis not present

## 2019-01-28 DIAGNOSIS — G609 Hereditary and idiopathic neuropathy, unspecified: Secondary | ICD-10-CM

## 2019-01-28 DIAGNOSIS — R0602 Shortness of breath: Secondary | ICD-10-CM | POA: Diagnosis not present

## 2019-01-28 DIAGNOSIS — M7989 Other specified soft tissue disorders: Secondary | ICD-10-CM

## 2019-01-28 DIAGNOSIS — I495 Sick sinus syndrome: Secondary | ICD-10-CM | POA: Diagnosis not present

## 2019-01-28 MED ORDER — LOSARTAN POTASSIUM 50 MG PO TABS: 50.0000 mg | ORAL_TABLET | Freq: Every day | ORAL | 3 refills | Status: DC

## 2019-01-28 MED ORDER — PREGABALIN 100 MG PO CAPS: 100.0000 mg | ORAL_CAPSULE | Freq: Every day | ORAL | 5 refills | Status: DC

## 2019-01-28 MED ORDER — ASPIRIN EC 81 MG PO TBEC: 81.0000 mg | DELAYED_RELEASE_TABLET | Freq: Every day | ORAL | 3 refills | Status: DC

## 2019-01-28 NOTE — Progress Notes (Signed)
Electrophysiology TeleHealth Note  Due to national recommendations of social distancing due to [place] 19, an audio telehealth visit is felt to be most appropriate for this patient at this time.  Verbal consent was obtained by me for the telehealth visit today.  The patient does not have capability for a virtual visit.  A phone visit is therefore required today.   Date:  01/28/2019   ID:  Kayla Davis, DOB 04/10/1923, MRN [id number]  Location: patient's home  Provider location:  [facility]  Evaluation Performed: Follow-up visit  PCP:  Plotnikov, Evie Lacks, MD   Electrophysiologist:  Dr [name]  Chief Complaint:  palpitations  History of Present Illness:    Kayla Davis is a 85 y.o. female who presents via telehealth conferencing today.  Since last being seen in our clinic, the patient reports doing reasonably well.  He she has stable and chronic SOB.  Today, she denies symptoms of palpitations, chest pain, lower extremity edema, dizziness, presyncope, or syncope.  The patient is otherwise without complaint today.  The patient denies symptoms of fevers, chills, cough, or new SOB worrisome for COVID 19.  Past Medical History:  Diagnosis Date  . Anemia, unspecified   . Anxiety state, unspecified   . Aortic valve disorders   . Bradycardia    s/p PPM  . Bursitis   . Complication of anesthesia    has had difficulty awakening in past; pt states did well with the anesthesia given during her carotid surgery   . Diabetes mellitus without complication ([place])   . Diverticulosis of colon (without mention of hemorrhage)   . Herpes zoster with other nervous system complications(053.19)   . Hyperlipidemia   . Hypertension   . Hypothyroidism   . Irritable bowel syndrome   . Menopause   . Osteoarthrosis, unspecified whether generalized or localized, unspecified site   . Peripheral vascular disease ([place])   . Squamous cell carcinoma of skin 04/21/2012   left sideburn - tx p bx   . Squamous cell carcinoma of skin 01/10/2013   recurrent on left sideburn - tx p bx  . Squamous cell carcinoma of skin 08/22/2013   left sideburn - tx p bx  . TIA (transient ischemic attack)   . Unspecified hearing loss   . Varicose veins     Past Surgical History:  Procedure Laterality Date  . APPENDECTOMY    . CATARACT EXTRACTION, BILATERAL  end of June 2013  . CHOLECYSTECTOMY    . DILATION AND CURETTAGE OF UTERUS    . ENDARTERECTOMY Right 01/20/2014   Procedure: RIGHT CAROTID ARTERY ENDARTERECTOMY WITH DACRON PATCH ANGIOPLASTY;  Surgeon: Mal Misty, MD;  Location: [facility];  Service: Vascular;  Laterality: Right;  . PACEMAKER INSERTION     MDT dual chamber PPM implanted by Dr [name] for sinus node dysfunction   . TONSILLECTOMY    . TOTAL HIP ARTHROPLASTY Left 03/23/2015   Procedure: TOTAL HIP ARTHROPLASTY ANTERIOR APPROACH;  Surgeon: Gaynelle Arabian, MD;  [facility];  Service: Orthopedics;  Laterality: Left;  [name] VAGINAL HYSTERECTOMY      Current Outpatient Medications  Medication Sig Dispense Refill  . ALPHA LIPOIC ACID PO Take 1 tablet by mouth daily.    [name] aspirin 325 MG tablet Take 325 mg by mouth daily.    [name] CRANBERRY PO Take 2 tablets by mouth daily with supper.     . Cyanocobalamin (VITAMIN B-12 PO) Take 1 tablet by mouth at bedtime.    [name]  diphenhydrAMINE (BENADRYL) 25 MG tablet Take 25 mg by mouth as needed (neuropathy).     [name] Calcium (STOOL SOFTENER PO) Take 2 capsules by mouth at bedtime.    [name] EVENING PRIMROSE OIL PO Take 1 capsule by mouth daily.    [name] glucose blood (ONE TOUCH ULTRA TEST) test strip TEST ONCE DAILY AS DIRECTED DX E11.9 100 each 5  . hydrochlorothiazide (MICROZIDE) 12.5 MG capsule Take 1 capsule (12.5 mg total) by mouth daily. (Patient taking differently: Take 12.5 mg by mouth 2 (two) times daily. ) 90 capsule 0  . ipratropium (ATROVENT) 0.06 % nasal spray Place 2 sprays into both nostrils 3 (three) times daily. 45 mL 1  . Lancets (ONETOUCH  ULTRASOFT) lancets Use to check blood sugars once a day Dx E11.9 100 each 3  . levothyroxine (SYNTHROID) 100 MCG tablet TAKE 1 TABLET BY MOUTH EVERY DAY BEFORE BREAKFAST 90 tablet 1  . losartan (COZAAR) 25 MG tablet Take 1 tablet (25 mg total) by mouth daily. 90 tablet 3  . metoprolol tartrate (LOPRESSOR) 25 MG tablet Take 1 tablet (25 mg total) by mouth 2 (two) times daily. 180 tablet 1  . Multiple Vitamins-Minerals (EYE VITAMINS) CAPS Take 1 capsule by mouth daily.    [name] (SYSTANE OP) Apply to eye daily.    . polyethylene glycol (MIRALAX / GLYCOLAX) packet Take 17 g by mouth daily as needed for mild constipation.    . pregabalin (LYRICA) 50 MG capsule Take 1 capsule (50 mg total) by mouth 2 (two) times daily. 180 capsule 3  . simvastatin (ZOCOR) 40 MG tablet TAKE 1 TABLET DAILY AT 6PM 90 tablet 3   No current facility-administered medications for this visit.     Allergies:   Penicillins, Alprazolam, Bactrim [sulfamethoxazole-trimethoprim], Ceclor [cefaclor], Cephalexin, Ciprofloxacin, Clindamycin/lincomycin, Hydrocodone, Noroxin [norfloxacin], Sulfonamide derivatives, Tetracyclines & related, and Tramadol   Social History:  The patient  reports that she has never smoked. She has never used smokeless tobacco. She reports that she does not drink alcohol or use drugs.   Family History:  The patient's family history includes Cancer in her paternal grandmother; Diabetes in her father; Heart disease in her father and mother.   ROS:  Please see the history of present illness.   All other systems are personally reviewed and negative.    Exam:    Vital Signs:  BP (!) 156/93   Pulse 77   Ht 5\' 5"  (1.651 m)   Wt 150 lb (68 kg)   LMP  (LMP Unknown)   BMI 24.96 kg/m   Well sounding, alert and conversant   Labs/Other Tests and Data Reviewed:    Recent Labs: 04/15/2018: TSH 10.14 06/18/2018: Hemoglobin 11.8; Platelets 219 06/24/2018: BUN 18; Creatinine, Ser 0.87;  Potassium 4.1; Sodium 130   Wt Readings from Last 3 Encounters:  01/28/19 150 lb (68 kg)  06/24/18 156 lb (70.8 kg)  04/16/18 158 lb (71.7 kg)     Last device remote is reviewed from [facility] [employer] which reveals normal device function, SVT noted   ASSESSMENT & PLAN:    1.  Sick sinus syndrome Normal pacemaker function by remotes No indication to make changes  2. HTN Elevated today Increase losartan to 50mg  daily Reduce ASA to 81mg  daily  3. Chronic diastolic dysfunction Stable No change required today  4. SOB  Likely multifactorial Chronic Given advanced age, I would favor a conservative approach  Follow-up:  6 months with EP NP  Patient Risk:  after full review of this patients clinical status, I feel that they are at moderate risk at this time.  Today, I have spent 15 minutes with the patient with telehealth technology discussing arrhythmia management .    SignedThompson Grayer, MD  01/28/2019 1:45 PM     [address] [place] [address] [phone] (office) [phone] (fax)

## 2019-01-28 NOTE — Progress Notes (Signed)
    Virtual Visit via Telephone Note The purpose of this virtual visit is to provide medical care while limiting exposure to the novel coronavirus.    Consent was obtained for phone visit:  Yes.   Answered questions that patient had about telehealth interaction:  Yes.   I discussed the limitations, risks, security and privacy concerns of performing an evaluation and management service by telephone. I also discussed with the patient that there may be a patient responsible charge related to this service. The patient expressed understanding and agreed to proceed.  Pt location: Home Physician Location: office Name of referring provider:  Plotnikov, Evie Lacks, MD I connected with .[name] at patients initiation/request on 01/28/2019 at  2:30 PM EDT by telephone and verified that I am speaking with the correct person using two identifiers.  Pt MRN:  [id number] Pt DOB:  07/01/1922   History of Present Illness:  This is a 85 year-old female for follow-up of bilateral feet pain.  At her last visit, I recommended increasing pregabalin to 100 mg at bedtime however, there was no improvement and she self tapered to 125mg  qhs.  She feels this may help some but not enough to provide significant relief.  Today, she is complaining of redness and swelling of the great toe.  She has tried applying Aspercreme.   Assessment and Plan:   1.  Painful neuropathy of the feet, possibly related to diabetes and thyroid disease, however with her advanced age this is most likely idiopathic.  -Increase pregabalin to 200 mg at bedtime   -If no improvement at this dose, transition to gabapentin   2.  Left toe swelling and erythema, this is atypical for neuropathy.   - Recommend getting this evaluated by her PCP for arthritis or gout   Follow Up Instructions:   I discussed the assessment and treatment plan with the patient. The patient was provided an opportunity to ask questions and all were answered. The patient  agreed with the plan and demonstrated an understanding of the instructions.   The patient was advised to call back or seek an in-person evaluation if the symptoms worsen or if the condition fails to improve as anticipated.    Total Time spent in visit with the patient was:  20 min, of which 100% of the time was spent in counseling and/or coordinating care.   Pt understands and agrees with the plan of care outlined.     [name], DO

## 2019-01-28 NOTE — Telephone Encounter (Signed)
Discussed with patient and answered all questions.

## 2019-01-28 NOTE — Telephone Encounter (Signed)
-----   Message from Thompson Grayer, MD sent at 01/28/2019  1:50 PM EDT ----- Please call her and increase losartan to 50mg  daily. Reduce ASA to 81mg  daily  Follow-up with Amber in 6 months

## 2019-01-28 NOTE — Telephone Encounter (Signed)
Call placed to Pt  Advised to decrease ASA to 81 mg daily  Advised to increase losartan 25 mg- to 50 mg by mouth daily  Pt advised she can take 2 of her 25 mg tabs until they are gone.  Per Pt requested new prescription be sent to local [facility] and son will pick it up.

## 2019-01-31 ENCOUNTER — Telehealth: Payer: Self-pay | Admitting: Neurology

## 2019-01-31 NOTE — Telephone Encounter (Signed)
Called patient and left a message to call back and schedule:  Please schedule 9-month VV or telephone call follow-up.

## 2019-01-31 NOTE — Telephone Encounter (Signed)
04/01/19 at 2:30 PM

## 2019-02-28 NOTE — Progress Notes (Addendum)
Subjective:   Kayla Davis is a 85 y.o. female who presents for Medicare Annual (Subsequent) preventive examination. I connected with patient by a telephone and verified that I am speaking with the correct person using two identifiers. Patient stated full name and DOB. Patient gave permission to continue with telephonic visit. Patient's location was at home and Nurse's location was at [place] office. Participants during this visit included patient and nurse.  Review of Systems:   Cardiac Risk Factors include: advanced age (>22men, >50 women);diabetes mellitus;hypertension Sleep patterns: feels rested on waking, does not get up to void, gets up 3 times nightly to void and sleeps 7 hours nightly.    Home Safety/Smoke Alarms: Feels safe in home. Smoke alarms in place.  Living environment; residence and Firearm Safety: 1-story house/ trailer. Lives with son, no needs for DME, good support system Seat Belt Safety/Bike Helmet: Wears seat belt.      Objective:     Vitals: LMP  (LMP Unknown)   There is no height or weight on file to calculate BMI.  Advanced Directives 03/01/2019 10/21/2018 06/18/2018 02/25/2018 03/14/2017 03/04/2017 10/02/2016  Does Patient Have a Medical Advance Directive? Yes Yes No Yes Yes Yes Yes  Type of Paramedic of [place];Living will - - Special educational needs teacher of [employer];Living will [place] -  Does patient want to make changes to medical advance directive? - - - Yes (ED - Information included in AVS) - - -  Copy of [place] in Chart? No - copy requested - - No - copy requested No - copy requested - -  Would patient like information on creating a medical advance directive? - - No - Patient declined - - - -  Pre-existing out of facility DNR order (yellow form or pink MOST form) - - - - - - -    Tobacco Social History   Tobacco Use  Smoking Status Never Smoker  Smokeless  Tobacco Never Used     Counseling given: Not Answered  Past Medical History:  Diagnosis Date  . Anemia, unspecified   . Anxiety state, unspecified   . Aortic valve disorders   . Bradycardia    s/p PPM  . Bursitis   . Complication of anesthesia    has had difficulty awakening in past; pt states did well with the anesthesia given during her carotid surgery   . Diabetes mellitus without complication ([place])   . Diverticulosis of colon (without mention of hemorrhage)   . Herpes zoster with other nervous system complications(053.19)   . Hyperlipidemia   . Hypertension   . Hypothyroidism   . Irritable bowel syndrome   . Menopause   . Osteoarthrosis, unspecified whether generalized or localized, unspecified site   . Peripheral vascular disease ([place])   . Squamous cell carcinoma of skin 04/21/2012   left sideburn - tx p bx  . Squamous cell carcinoma of skin 01/10/2013   recurrent on left sideburn - tx p bx  . Squamous cell carcinoma of skin 08/22/2013   left sideburn - tx p bx  . TIA (transient ischemic attack)   . Unspecified hearing loss   . Varicose veins    Past Surgical History:  Procedure Laterality Date  . APPENDECTOMY    . CATARACT EXTRACTION, BILATERAL  end of June 2013  . CHOLECYSTECTOMY    . DILATION AND CURETTAGE OF UTERUS    . ENDARTERECTOMY Right 01/20/2014   Procedure: RIGHT CAROTID  ARTERY ENDARTERECTOMY WITH DACRON PATCH ANGIOPLASTY;  Surgeon: Mal Misty, MD;  Location: [facility];  Service: Vascular;  Laterality: Right;  . PACEMAKER INSERTION     MDT dual chamber PPM implanted by Dr [name] for sinus node dysfunction   . TONSILLECTOMY    . TOTAL HIP ARTHROPLASTY Left 03/23/2015   Procedure: TOTAL HIP ARTHROPLASTY ANTERIOR APPROACH;  Surgeon: Gaynelle Arabian, MD;  Location: [facility];  Service: Orthopedics;  Laterality: Left;  [name] VAGINAL HYSTERECTOMY     Family History  Problem Relation Age of Onset  . Heart disease Mother   . Heart disease Father   . Diabetes Father    . Cancer Paternal Grandmother    Social History   Socioeconomic History  . Marital status: Widowed    Spouse name: Gypsy Balsam.  (deceased)  . Number of children: 4  . Years of education: COLLEGE  . Highest education level: Not on file  Occupational History  . Occupation: Retired    Fish farm manager: RETIRED  Social Needs  . Financial resource strain: Not hard at all  . Food insecurity    Worry: Never true    Inability: Never true  . Transportation needs    Medical: No    Non-medical: No  Tobacco Use  . Smoking status: Never Smoker  . Smokeless tobacco: Never Used  Substance and Sexual Activity  . Alcohol use: No    Alcohol/week: 0.0 standard drinks  . Drug use: No  . Sexual activity: Not Currently  Lifestyle  . Physical activity    Days per week: 5 days    Minutes per session: 20 min  . Stress: Not at all  Relationships  . Social connections    Talks on phone: More than three times a week    Gets together: More than three times a week    Attends religious service: 1 to 4 times per year    Active member of club or organization: Yes    Attends meetings of clubs or organizations: 1 to 4 times per year    Relationship status: Widowed  Other Topics Concern  . Not on file  Social History Narrative   Patient is widowed with 4 children. Son stays with patient at night, 08/11/16.   Patient is right handed.   Patient has college education.   Patient drinks 2 cups daily.    Outpatient Encounter Medications as of 03/01/2019  Medication Sig  . ALPHA LIPOIC ACID PO Take 1 tablet by mouth daily.  [name] aspirin EC 81 MG tablet Take 1 tablet (81 mg total) by mouth daily.  [name] CRANBERRY PO Take 3 tablets by mouth daily with supper.   . Cyanocobalamin (VITAMIN B-12 PO) Take 1 tablet by mouth at bedtime.  . diphenhydrAMINE (BENADRYL) 25 MG tablet Take 25 mg by mouth as needed (neuropathy).   [name] Calcium (STOOL SOFTENER PO) Take 2 capsules by mouth at bedtime.  [name] EVENING PRIMROSE OIL PO  Take 1 capsule by mouth daily.  [name] glucose blood (ONE TOUCH ULTRA TEST) test strip TEST ONCE DAILY AS DIRECTED DX E11.9  . hydrochlorothiazide (MICROZIDE) 12.5 MG capsule Take 1 capsule (12.5 mg total) by mouth daily. (Patient taking differently: Take 12.5 mg by mouth 2 (two) times daily. )  . ipratropium (ATROVENT) 0.06 % nasal spray Place 2 sprays into both nostrils 3 (three) times daily.  . Lancets (ONETOUCH ULTRASOFT) lancets Use to check blood sugars once a day Dx E11.9  . levothyroxine (SYNTHROID) 100 MCG tablet  TAKE 1 TABLET BY MOUTH EVERY DAY BEFORE BREAKFAST  . losartan (COZAAR) 50 MG tablet Take 1 tablet (50 mg total) by mouth daily.  . metoprolol tartrate (LOPRESSOR) 25 MG tablet Take 1 tablet (25 mg total) by mouth 2 (two) times daily.  . Multiple Vitamins-Minerals (EYE VITAMINS) CAPS Take 1 capsule by mouth daily.  [name] (SYSTANE OP) Apply to eye daily.  . polyethylene glycol (MIRALAX / GLYCOLAX) packet Take 17 g by mouth daily as needed for mild constipation.  . pregabalin (LYRICA) 100 MG capsule Take 1 capsule (100 mg total) by mouth at bedtime.  . simvastatin (ZOCOR) 40 MG tablet TAKE 1 TABLET DAILY AT 6PM   No facility-administered encounter medications on file as of 03/01/2019.     Activities of Daily Living In your present state of health, do you have any difficulty performing the following activities: 03/01/2019  Hearing? Y  Vision? N  Difficulty concentrating or making decisions? N  Walking or climbing stairs? Y  Dressing or bathing? N  Doing errands, shopping? Y  Preparing Food and eating ? Y  Using the Toilet? N  In the past six months, have you accidently leaked urine? Y  Do you have problems with loss of bowel control? Y  Managing your Medications? N  Managing your Finances? N  Housekeeping or managing your Housekeeping? Y  Some recent data might be hidden    Patient Care Team: Plotnikov, Evie Lacks, MD as PCP - General (Internal  Medicine) Shon Hough, MD as Consulting Physician (Ophthalmology) Thompson Grayer, MD as Consulting Physician (Cardiology) Suella Broad, MD as Consulting Physician (Physical Medicine and Rehabilitation) Gaynelle Arabian, MD as Consulting Physician (Orthopedic Surgery) Nickie Retort, MD as Consulting Physician (Urology) [name], DO as Consulting Physician (Neurology)       Assessment:   This is a routine wellness examination for Qwanisha. Physical assessment deferred to PCP.  Exercise Activities and Dietary recommendations Current Exercise Habits: Home exercise routine, Time (Minutes): 20, Frequency (Times/Week): 5, Weekly Exercise (Minutes/Week): 100, Intensity: Mild, Exercise limited by: orthopedic condition(s)  Diet (meal preparation, eat out, water intake, caffeinated beverages, dairy products, fruits and vegetables): in general, a "healthy" diet  , well balanced. eats a variety of fruits and vegetables daily, limits salt, fat/cholesterol, sugar,carbohydrates,caffeine, drinks 6-8 glasses of water daily.  Goals      Patient Stated   . patient (pt-stated)     Wants to live to see 47 and 83 year old grow up. Keep exercising; keep moving; keep busy      Other   . Continue to do my  normal activities, enjoy family and church       Fall Risk Fall Risk  03/01/2019 10/21/2018 02/25/2018 01/01/2018 09/11/2016  Falls in the past year? 1 0 0 No No  Number falls in past yr: 1 - - - -  Injury with Fall? 0 - - - -  Risk for fall due to : History of fall(s);Impaired balance/gait;Impaired mobility - Impaired balance/gait;Impaired mobility - -   Is the patient's home free of loose throw rugs in walkways, pet beds, electrical cords, etc?   yes      Grab bars in the bathroom? yes      Handrails on the stairs?   yes      Adequate lighting?   yes  Depression Screen PHQ 2/9 Scores 03/01/2019 02/25/2018 01/01/2018 09/11/2016  PHQ - 2 Score 1 0 0 0  PHQ- 9 Score 3 2 - -  Cognitive Function MMSE - Mini Mental State Exam 02/25/2018 09/11/2016 07/03/2015  Not completed: - - (No Data)  Orientation to time 5 5 -  Orientation to Place 5 5 -  Registration 3 3 -  Attention/ Calculation 5 3 -  Recall 2 2 -  Language- name 2 objects 2 2 -  Language- repeat 1 1 -  Language- follow 3 step command 3 3 -  Language- read & follow direction 1 1 -  Write a sentence 1 1 -  Copy design 1 1 -  Total score 29 27 -     6CIT Screen 03/01/2019  What Year? 0 points  What month? 0 points  What time? 0 points  Count back from 20 0 points  Months in reverse 0 points  Repeat phrase 0 points  Total Score 0    Immunization History  Administered Date(s) Administered  . Fluad Quad(high Dose 65+) 12/24/2018  . Influenza Split 01/13/2011, 12/30/2011, 02/01/2013  . Influenza Whole 12/21/2008, 01/12/2010  . Influenza, High Dose Seasonal PF 12/09/2013, 12/11/2015, 12/24/2016, 01/01/2018  . Influenza,inj,Quad PF,6+ Mos 01/23/2015  . Pneumococcal Conjugate-13 04/21/2014  . Pneumococcal Polysaccharide-23 04/15/2018  . Pneumococcal-Unspecified 02/25/2005   Screening Tests Health Maintenance  Topic Date Due  . TETANUS/TDAP  12/17/1941  . HEMOGLOBIN A1C  10/14/2018  . OPHTHALMOLOGY EXAM  10/22/2018  . FOOT EXAM  02/26/2019  . INFLUENZA VACCINE  Completed  . PNA vac Low Risk Adult  Completed  . DEXA SCAN  Discontinued      Plan:   Scheduled a virtual visit with PCP to discuss patient's left foot being red, painful and cold. Patient is also experiencing concerning neck pain.  Reviewed health maintenance screenings with patient today and relevant education, vaccines, and/or referrals were provided.   Continue to eat heart healthy diet (full of fruits, vegetables, whole grains, lean protein, water--limit salt, fat, and sugar intake) and increase physical activity as tolerated.  Continue doing brain stimulating activities (puzzles, reading, adult coloring books, staying  active) to keep memory sharp.   I have personally reviewed and noted the following in the patient's chart:   . Medical and social history . Use of alcohol, tobacco or illicit drugs  . Current medications and supplements . Functional ability and status . Nutritional status . Physical activity . Advanced directives . List of other physicians . Screenings to include cognitive, depression, and falls . Referrals and appointments  In addition, I have reviewed and discussed with patient certain preventive protocols, quality metrics, and best practice recommendations. A written personalized care plan for preventive services as well as general preventive health recommendations were provided to patient.     Michiel Cowboy, RN  03/01/2019   Medical screening examination/treatment/procedure(s) were performed by non-physician practitioner and as supervising physician I was immediately available for consultation/collaboration. I agree with above. Lew Dawes, MD

## 2019-03-01 ENCOUNTER — Ambulatory Visit (INDEPENDENT_AMBULATORY_CARE_PROVIDER_SITE_OTHER): Payer: MEDICARE | Admitting: *Deleted

## 2019-03-01 ENCOUNTER — Ambulatory Visit: Payer: MEDICARE

## 2019-03-01 DIAGNOSIS — Z0000 Encounter for general adult medical examination without abnormal findings: Secondary | ICD-10-CM

## 2019-03-07 ENCOUNTER — Ambulatory Visit (INDEPENDENT_AMBULATORY_CARE_PROVIDER_SITE_OTHER): Payer: MEDICARE | Admitting: Internal Medicine

## 2019-03-07 DIAGNOSIS — H608X2 Other otitis externa, left ear: Secondary | ICD-10-CM

## 2019-03-07 DIAGNOSIS — I10 Essential (primary) hypertension: Secondary | ICD-10-CM

## 2019-03-07 DIAGNOSIS — M109 Gout, unspecified: Secondary | ICD-10-CM | POA: Diagnosis not present

## 2019-03-07 DIAGNOSIS — G453 Amaurosis fugax: Secondary | ICD-10-CM | POA: Diagnosis not present

## 2019-03-07 DIAGNOSIS — H6090 Unspecified otitis externa, unspecified ear: Secondary | ICD-10-CM | POA: Insufficient documentation

## 2019-03-07 MED ORDER — METHYLPREDNISOLONE 4 MG PO TBPK: ORAL_TABLET | ORAL | 0 refills | Status: DC

## 2019-03-07 MED ORDER — NEOMYCIN-POLYMYXIN-HC 3.5-10000-1 OT SOLN: 3.0000 [drp] | Freq: Three times a day (TID) | OTIC | 3 refills | Status: DC

## 2019-03-07 NOTE — Assessment & Plan Note (Signed)
L 1st MTP  Voltaren gel Medrol pack

## 2019-03-07 NOTE — Assessment & Plan Note (Addendum)
No relapse Losartan bid ASA Simvastatin

## 2019-03-07 NOTE — Progress Notes (Signed)
Virtual Visit via Video Note  I connected with Kayla Davis on 03/07/19 at  2:40 PM EST by a video enabled telemedicine application and verified that I am speaking with the correct person using two identifiers.   I discussed the limitations of evaluation and management by telemedicine and the availability of in person appointments. The patient expressed understanding and agreed to proceed.  History of Present Illness: We need to follow-up on HTN, TIA/CVA, OA, vertigo f/u  There has been no  cough, chest pain, shortness of breath, abdominal pain, diarrhea, constipation   Observations/Objective: The patient appears to be in no acute distress, looks well.  Assessment and Plan:  See my Assessment and Plan. Follow Up Instructions:    I discussed the assessment and treatment plan with the patient. The patient was provided an opportunity to ask questions and all were answered. The patient agreed with the plan and demonstrated an understanding of the instructions.   The patient was advised to call back or seek an in-person evaluation if the symptoms worsen or if the condition fails to improve as anticipated.  I provided face-to-face time during this encounter. We were at different locations.   [name], MD

## 2019-03-07 NOTE — Assessment & Plan Note (Signed)
Cortisporin Gtt

## 2019-03-07 NOTE — Assessment & Plan Note (Signed)
BP ok at home 

## 2019-03-09 MED ORDER — IPRATROPIUM BROMIDE 0.06 % NA SOLN: 2.0000 | Freq: Three times a day (TID) | NASAL | 3 refills | Status: DC

## 2019-04-01 ENCOUNTER — Telehealth (INDEPENDENT_AMBULATORY_CARE_PROVIDER_SITE_OTHER): Payer: MEDICARE | Admitting: Neurology

## 2019-04-01 ENCOUNTER — Other Ambulatory Visit: Payer: Self-pay

## 2019-04-01 DIAGNOSIS — M7989 Other specified soft tissue disorders: Secondary | ICD-10-CM | POA: Diagnosis not present

## 2019-04-01 DIAGNOSIS — G609 Hereditary and idiopathic neuropathy, unspecified: Secondary | ICD-10-CM

## 2019-04-01 DIAGNOSIS — M79675 Pain in left toe(s): Secondary | ICD-10-CM | POA: Diagnosis not present

## 2019-04-01 NOTE — Progress Notes (Signed)
   Virtual Visit via Video Note The purpose of this virtual visit is to provide medical care while limiting exposure to the novel coronavirus.    Consent was obtained for video visit:  Yes.   Answered questions that patient had about telehealth interaction:  Yes.   I discussed the limitations, risks, security and privacy concerns of performing an evaluation and management service by telemedicine. I also discussed with the patient that there may be a patient responsible charge related to this service. The patient expressed understanding and agreed to proceed.  Pt location: Home Physician Location: office Name of referring provider:  Plotnikov, Evie Lacks, MD I connected with [name] at patients initiation/request on 04/01/2019 at  2:30 PM EST by video enabled telemedicine application and verified that I am speaking with the correct person using two identifiers. Pt MRN:  [id number] Pt DOB:  [birth year] Video Participants:  [name]; son   History of Present Illness: This is a 85 y.o. female returning for follow-up of peripheral neuropathy and ongoing complaints of left toe pain and redness.  She continues to have burning pain in the left toe, associated with redness and swelling, which is worse in the evening. She has noticed certain foods make it worse.  She takes Lyrica 150mg  daily and did not increase the dose to 200mg  because she wanted to finish the 75mg  tablets.  She did a virtual visit with her PCP for left great toe pain and was offered voltaren gel and medrol dose pack, but did not take this.  She applied triple antibiotic ointment every night which provides the most relief.   Observations/Objective:   Patient is awake, alert, and appears comfortable.   Extraocular muscles are intact. No ptosis.  Face is symmetric.   Antigravity in the arms.  She is able to extend and flex the toes.  There is mild erythema over the left great toe, I do not appreciate any swelling.  Gait  not tested  Assessment and Plan:  1.  Peripheral neuropathy, increase Lyrica to 200mg  at bedtime. 2.  Left great toe pain, unsure if this is truly due to neuropathy as her symptoms are very localized, associated with swelling and redness, the latter which are not features of neuropathy.  Further, she tends to get relief with antibiotic ointment, which does not favor neuropathic pain.  She did not take medication prescribed for gout. Again, I have asked her to follow-up with PCP.   Follow Up Instructions:   I discussed the assessment and treatment plan with the patient. The patient was provided an opportunity to ask questions and all were answered. The patient agreed with the plan and demonstrated an understanding of the instructions.   The patient was advised to call back or seek an in-person evaluation if the symptoms worsen or if the condition fails to improve as anticipated.  Total time spent:  18 minutes     [name], DO

## 2019-04-05 ENCOUNTER — Telehealth: Payer: Self-pay | Admitting: Internal Medicine

## 2019-04-05 DIAGNOSIS — H60392 Other infective otitis externa, left ear: Secondary | ICD-10-CM | POA: Diagnosis not present

## 2019-04-05 DIAGNOSIS — H9209 Otalgia, unspecified ear: Secondary | ICD-10-CM

## 2019-04-05 NOTE — Telephone Encounter (Signed)
Pt's son on pt's behalf, pt is currently using ear drops prescribed by her PCP. Pt says that her ear pain is still not better and would like to know if provider could prescribe something different?    Meriel FlavorsChrissie Noa [phone]

## 2019-04-06 NOTE — Telephone Encounter (Signed)
I can refer to ENT for a consultation. I don't have anything else to offer. Thx

## 2019-04-06 NOTE — Telephone Encounter (Signed)
Left message for patients son to call back.

## 2019-04-06 NOTE — Telephone Encounter (Signed)
Pt returning call about mother cb at [phone] for nurse

## 2019-04-07 NOTE — Telephone Encounter (Signed)
Patient's son informed of below. He would like ENT referral.

## 2019-04-07 NOTE — Telephone Encounter (Signed)
OK 

## 2019-04-13 ENCOUNTER — Telehealth: Payer: Self-pay

## 2019-04-13 NOTE — Telephone Encounter (Signed)
Copied from [place] [phone]. Topic: Referral - Status >> Apr 13, 2019  4:02 PM Alanda Slim E wrote: Reason for CRM: Pt called to check the status of the ENT referal request and to see who it will be sent to so she can get on their schedule asap / Pt would like the office to call her daughter or her son with the information / please advise asap

## 2019-04-14 NOTE — Telephone Encounter (Signed)
Please check on referral for me, thank you

## 2019-04-14 NOTE — Telephone Encounter (Signed)
Referral has been sent to [facility] - spoke to dtr and gave her their number to call

## 2019-04-18 DIAGNOSIS — M316 Other giant cell arteritis: Secondary | ICD-10-CM | POA: Diagnosis not present

## 2019-04-18 DIAGNOSIS — H6123 Impacted cerumen, bilateral: Secondary | ICD-10-CM | POA: Diagnosis not present

## 2019-04-19 ENCOUNTER — Ambulatory Visit (INDEPENDENT_AMBULATORY_CARE_PROVIDER_SITE_OTHER): Payer: MEDICARE | Admitting: *Deleted

## 2019-04-19 DIAGNOSIS — I495 Sick sinus syndrome: Secondary | ICD-10-CM

## 2019-04-20 LAB — CUP PACEART REMOTE DEVICE CHECK
Battery Impedance: 1211 Ohm
Battery Remaining Longevity: 54 mo
Battery Voltage: 2.77 V
Brady Statistic AP VP Percent: 3 %
Brady Statistic AP VS Percent: 93 %
Brady Statistic AS VP Percent: 1 %
Brady Statistic AS VS Percent: 3 %
Date Time Interrogation Session: 20210105195207
Implantable Lead Implant Date: 20111031
Implantable Lead Implant Date: 20111031
Implantable Lead Location: 753859
Implantable Lead Location: 753860
Implantable Lead Model: 4469
Implantable Lead Model: 4470
Implantable Lead Serial Number: 548557
Implantable Lead Serial Number: 686076
Implantable Pulse Generator Implant Date: 20111031
Lead Channel Impedance Value: 501 Ohm
Lead Channel Impedance Value: 512 Ohm
Lead Channel Pacing Threshold Amplitude: 0.5 V
Lead Channel Pacing Threshold Amplitude: 0.625 V
Lead Channel Pacing Threshold Pulse Width: 0.4 ms
Lead Channel Pacing Threshold Pulse Width: 0.4 ms
Lead Channel Setting Pacing Amplitude: 2 V
Lead Channel Setting Pacing Amplitude: 2.5 V
Lead Channel Setting Pacing Pulse Width: 0.4 ms
Lead Channel Setting Sensing Sensitivity: 5.6 mV

## 2019-04-23 ENCOUNTER — Ambulatory Visit: Payer: MEDICARE | Attending: Internal Medicine

## 2019-04-23 DIAGNOSIS — Z23 Encounter for immunization: Secondary | ICD-10-CM | POA: Diagnosis not present

## 2019-04-23 NOTE — Progress Notes (Signed)
   Covid-19 Vaccination Clinic  Name:  KARMAN CASIDA    MRN: [id number] DOB: 1923/01/21  04/23/2019  Ms. Eltz was observed post Covid-19 immunization for 30 minutes based on pre-vaccination screening without incidence. She was provided with Vaccine Information Sheet and instruction to access the V-Safe system.   Ms. Schelle was instructed to call 911 with any severe reactions post vaccine: [name] Difficulty breathing  . Swelling of your face and throat  . A fast heartbeat  . A bad rash all over your body  . Dizziness and weakness    Immunizations Administered    Name Date Dose VIS Date Route   Pfizer COVID-19 Vaccine 04/23/2019  1:58 PM 0.3 mL 03/25/2019 Intramuscular   Manufacturer: [employer], [employer]   Lot: [id number]   [place]: [id number]

## 2019-04-29 ENCOUNTER — Other Ambulatory Visit: Payer: Self-pay

## 2019-04-29 MED ORDER — PREGABALIN 200 MG PO CAPS: 100.0000 mg | ORAL_CAPSULE | Freq: Every day | ORAL | 3 refills | Status: DC

## 2019-04-30 ENCOUNTER — Other Ambulatory Visit: Payer: Self-pay

## 2019-04-30 ENCOUNTER — Emergency Department (HOSPITAL_COMMUNITY): Payer: MEDICARE

## 2019-04-30 ENCOUNTER — Emergency Department (HOSPITAL_COMMUNITY)
Admission: EM | Admit: 2019-04-30 | Discharge: 2019-04-30 | Disposition: A | Discharge status: Home / Self Care | Payer: MEDICARE | Attending: Emergency Medicine | Admitting: Emergency Medicine

## 2019-04-30 ENCOUNTER — Encounter (HOSPITAL_COMMUNITY): Payer: Self-pay | Admitting: Emergency Medicine

## 2019-04-30 DIAGNOSIS — R519 Headache, unspecified: Secondary | ICD-10-CM | POA: Insufficient documentation

## 2019-04-30 DIAGNOSIS — Z79899 Other long term (current) drug therapy: Secondary | ICD-10-CM | POA: Insufficient documentation

## 2019-04-30 DIAGNOSIS — Z950 Presence of cardiac pacemaker: Secondary | ICD-10-CM | POA: Diagnosis not present

## 2019-04-30 DIAGNOSIS — Z96642 Presence of left artificial hip joint: Secondary | ICD-10-CM | POA: Diagnosis not present

## 2019-04-30 DIAGNOSIS — E871 Hypo-osmolality and hyponatremia: Secondary | ICD-10-CM

## 2019-04-30 DIAGNOSIS — I10 Essential (primary) hypertension: Secondary | ICD-10-CM | POA: Insufficient documentation

## 2019-04-30 DIAGNOSIS — H5319 Other subjective visual disturbances: Secondary | ICD-10-CM | POA: Diagnosis not present

## 2019-04-30 DIAGNOSIS — Z85828 Personal history of other malignant neoplasm of skin: Secondary | ICD-10-CM | POA: Diagnosis not present

## 2019-04-30 DIAGNOSIS — E039 Hypothyroidism, unspecified: Secondary | ICD-10-CM | POA: Diagnosis not present

## 2019-04-30 DIAGNOSIS — H538 Other visual disturbances: Secondary | ICD-10-CM | POA: Insufficient documentation

## 2019-04-30 DIAGNOSIS — N390 Urinary tract infection, site not specified: Secondary | ICD-10-CM | POA: Diagnosis not present

## 2019-04-30 DIAGNOSIS — E119 Type 2 diabetes mellitus without complications: Secondary | ICD-10-CM | POA: Insufficient documentation

## 2019-04-30 DIAGNOSIS — Z862 Personal history of diseases of the blood and blood-forming organs and certain disorders involving the immune mechanism: Secondary | ICD-10-CM | POA: Diagnosis not present

## 2019-04-30 DIAGNOSIS — Z7982 Long term (current) use of aspirin: Secondary | ICD-10-CM | POA: Insufficient documentation

## 2019-04-30 DIAGNOSIS — Z8673 Personal history of transient ischemic attack (TIA), and cerebral infarction without residual deficits: Secondary | ICD-10-CM | POA: Insufficient documentation

## 2019-04-30 DIAGNOSIS — I6523 Occlusion and stenosis of bilateral carotid arteries: Secondary | ICD-10-CM | POA: Diagnosis not present

## 2019-04-30 LAB — CBC
HCT: 37.6 % (ref 36.0–46.0)
Hemoglobin: 12.2 g/dL (ref 12.0–15.0)
MCH: 31.8 pg (ref 26.0–34.0)
MCHC: 32.4 g/dL (ref 30.0–36.0)
MCV: 97.9 fL (ref 80.0–100.0)
Platelets: 196 10*3/uL (ref 150–400)
RBC: 3.84 MIL/uL — ABNORMAL LOW (ref 3.87–5.11)
RDW: 12.7 % (ref 11.5–15.5)
WBC: 6.5 10*3/uL (ref 4.0–10.5)
nRBC: 0 % (ref 0.0–0.2)

## 2019-04-30 LAB — COMPREHENSIVE METABOLIC PANEL
ALT: 18 U/L (ref 0–44)
AST: 21 U/L (ref 15–41)
Albumin: 3.8 g/dL (ref 3.5–5.0)
Alkaline Phosphatase: 62 U/L (ref 38–126)
Anion gap: 9 (ref 5–15)
BUN: 14 mg/dL (ref 8–23)
CO2: 27 mmol/L (ref 22–32)
Calcium: 9 mg/dL (ref 8.9–10.3)
Chloride: 93 mmol/L — ABNORMAL LOW (ref 98–111)
Creatinine, Ser: 0.82 mg/dL (ref 0.44–1.00)
GFR calc Af Amer: >60 mL/min (ref >60)
GFR calc non Af Amer: >60 mL/min (ref >60)
Glucose, Bld: 169 mg/dL — ABNORMAL HIGH (ref 70–99)
Potassium: 3.8 mmol/L (ref 3.5–5.1)
Sodium: 129 mmol/L — ABNORMAL LOW (ref 135–145)
Total Bilirubin: 0.9 mg/dL (ref 0.3–1.2)
Total Protein: 6.1 g/dL — ABNORMAL LOW (ref 6.5–8.1)

## 2019-04-30 LAB — DIFFERENTIAL
Abs Immature Granulocytes: 0.04 10*3/uL (ref 0.00–0.07)
Basophils Absolute: 0.1 10*3/uL (ref 0.0–0.1)
Basophils Relative: 1 %
Eosinophils Absolute: 0.3 10*3/uL (ref 0.0–0.5)
Eosinophils Relative: 4 %
Immature Granulocytes: 1 %
Lymphocytes Relative: 20 %
Lymphs Abs: 1.3 10*3/uL (ref 0.7–4.0)
Monocytes Absolute: 0.5 10*3/uL (ref 0.1–1.0)
Monocytes Relative: 7 %
Neutro Abs: 4.4 10*3/uL (ref 1.7–7.7)
Neutrophils Relative %: 67 %

## 2019-04-30 LAB — PROTIME-INR
INR: 1.1 (ref 0.8–1.2)
Prothrombin Time: 13.9 seconds (ref 11.4–15.2)

## 2019-04-30 LAB — URINALYSIS, ROUTINE W REFLEX MICROSCOPIC
Bilirubin Urine: NEGATIVE
Glucose, UA: NEGATIVE mg/dL
Hgb urine dipstick: NEGATIVE
Ketones, ur: NEGATIVE mg/dL
Nitrite: NEGATIVE
Protein, ur: NEGATIVE mg/dL
Specific Gravity, Urine: 1.006 (ref 1.005–1.030)
pH: 7 (ref 5.0–8.0)

## 2019-04-30 LAB — I-STAT CHEM 8, ED
BUN: 17 mg/dL (ref 8–23)
Calcium, Ion: 1.15 mmol/L (ref 1.15–1.40)
Chloride: 93 mmol/L — ABNORMAL LOW (ref 98–111)
Creatinine, Ser: 0.8 mg/dL (ref 0.44–1.00)
Glucose, Bld: 161 mg/dL — ABNORMAL HIGH (ref 70–99)
HCT: 36 % (ref 36.0–46.0)
Hemoglobin: 12.2 g/dL (ref 12.0–15.0)
Potassium: 3.9 mmol/L (ref 3.5–5.1)
Sodium: 129 mmol/L — ABNORMAL LOW (ref 135–145)
TCO2: 28 mmol/L (ref 22–32)

## 2019-04-30 LAB — RAPID URINE DRUG SCREEN, HOSP PERFORMED
Amphetamines: NOT DETECTED
Barbiturates: NOT DETECTED
Benzodiazepines: NOT DETECTED
Cocaine: NOT DETECTED
Opiates: NOT DETECTED
Tetrahydrocannabinol: NOT DETECTED

## 2019-04-30 LAB — APTT: aPTT: 30 seconds (ref 24–36)

## 2019-04-30 MED ORDER — IOHEXOL 350 MG/ML SOLN
50.0000 mL | Freq: Once | INTRAVENOUS | Status: AC | PRN
  Administered 2019-04-30: 50 mL via INTRAVENOUS

## 2019-04-30 MED ORDER — TOPIRAMATE 25 MG PO TABS: 25.0000 mg | ORAL_TABLET | Freq: Every day | ORAL | 0 refills | Status: DC

## 2019-04-30 MED ORDER — NITROFURANTOIN MONOHYD MACRO 100 MG PO CAPS: 100.0000 mg | ORAL_CAPSULE | Freq: Two times a day (BID) | ORAL | 0 refills | Status: DC

## 2019-04-30 NOTE — ED Notes (Signed)
Pt back from CT

## 2019-04-30 NOTE — ED Notes (Signed)
Urine Culture sent to Main Lab with UA 

## 2019-04-30 NOTE — ED Notes (Signed)
Patient transported to CT 

## 2019-04-30 NOTE — ED Triage Notes (Signed)
Pt. Stated, I was reading the mail and it became squiggly. My neck and left side of my head hurting.

## 2019-04-30 NOTE — Consult Note (Addendum)
NEURO HOSPITALIST CONSULT NOTE   Requesting physician: Dr. Ralene Bathe  Reason for Consult: Headache  History obtained from:  Patient and Chart    HPI:                                                                                                                                          Kayla Davis is an 86 y.o. female  With [place] with SCC x3, TIA, hypothyroidism, HTN, DM bradycardia (s/p PPM) who presented to [facility] ED with HA and vision changes.   Per patient she was reading and her mail became "squiggly"  ( the words looked wavy and the left side of her head and neck were hurting). Her sensation of "squiggly" vision lasted about 10-12 minutes.   She stated that the left ear/ side of her head and neck have been hurting for about 1 month. It mostly hurts when she touches it or tries to lay down on it at night. She has been able to take tylenol to relieve the pain. It is not constant, but she is unable to really describe the pain.  She was concerned today because of the squiggly vision. Takes a daily baby ASA.  When this happened today she took 4 baby ASA. Denies any HA or migraine history.   ED course: Na: 129 BG; 161 UDS: negative UA: + UTI  CTA of head and neck was obtained, revealing the following: -- No acute large or medium vessel occlusion.  -- Aortic atherosclerosis without aneurysm or dissection. -- Right carotid bifurcation widely patent status post previous carotid endarterectomy. Atherosclerotic disease at the left carotid bifurcation and ICA bulb. Maximal stenosis of the ICA bulb is 20%. -- Plaque at both vertebral artery origins with narrowing estimated at 30%. -- Calcified plaque in both carotid siphons but without stenosis greater than 30%. -- 30-50% atherosclerotic narrowing of both vertebral artery V4 segments. Both vessels do supply the basilar. -- Advanced arthropathy at the C1-2 articulation with prominent pannus, resulting in stenosis at the C1  level and foramen magnum and apparent compressive effect upon the cervicomedullary junction. This may have worsened slightly since the previous exam.    Past Medical History:  Diagnosis Date  . Anemia, unspecified   . Anxiety state, unspecified   . Aortic valve disorders   . Bradycardia    s/p PPM  . Bursitis   . Complication of anesthesia    has had difficulty awakening in past; pt states did well with the anesthesia given during her carotid surgery   . Diabetes mellitus without complication ([place])   . Diverticulosis of colon (without mention of hemorrhage)   . Herpes zoster with other nervous system complications(053.19)   . Hyperlipidemia   . Hypertension   . Hypothyroidism   .  Irritable bowel syndrome   . Menopause   . Osteoarthrosis, unspecified whether generalized or localized, unspecified site   . Peripheral vascular disease ([place])   . Squamous cell carcinoma of skin 04/21/2012   left sideburn - tx p bx  . Squamous cell carcinoma of skin 01/10/2013   recurrent on left sideburn - tx p bx  . Squamous cell carcinoma of skin 08/22/2013   left sideburn - tx p bx  . TIA (transient ischemic attack)   . Unspecified hearing loss   . Varicose veins     Past Surgical History:  Procedure Laterality Date  . APPENDECTOMY    . CATARACT EXTRACTION, BILATERAL  end of June 2013  . CHOLECYSTECTOMY    . DILATION AND CURETTAGE OF UTERUS    . ENDARTERECTOMY Right 01/20/2014   Procedure: RIGHT CAROTID ARTERY ENDARTERECTOMY WITH DACRON PATCH ANGIOPLASTY;  Surgeon: Mal Misty, MD;  Location: [facility];  Service: Vascular;  Laterality: Right;  . PACEMAKER INSERTION     MDT dual chamber PPM implanted by Dr [name] for sinus node dysfunction   . TONSILLECTOMY    . TOTAL HIP ARTHROPLASTY Left 03/23/2015   Procedure: TOTAL HIP ARTHROPLASTY ANTERIOR APPROACH;  Surgeon: Gaynelle Arabian, MD;  Location: [facility];  Service: Orthopedics;  Laterality: Left;  [name] VAGINAL HYSTERECTOMY      Family History   Problem Relation Age of Onset  . Heart disease Mother   . Heart disease Father   . Diabetes Father   . Cancer Paternal Grandmother         Social History:  reports that she has never smoked. She has never used smokeless tobacco. She reports that she does not drink alcohol or use drugs.  Allergies  Allergen Reactions  . Penicillins Swelling and Other (See Comments)    BODY SWELLS & PT STATES THAT SHE ALMOST DIED Has patient had a PCN reaction causing immediate rash, facial/tongue/throat swelling, SOB or lightheadedness with hypotension: Yes Has patient had a PCN reaction causing severe rash involving mucus membranes or skin necrosis: known Has patient had a PCN reaction that required hospitalization Yes Has patient had a PCN reaction occurring within the last 10 years: No If all of the above answers are "NO", then may proceed with Cephalosporin u  . Alprazolam Swelling    EYES SWELL  . Bactrim [Sulfamethoxazole-Trimethoprim]     Unable to remember the side effects  . Ceclor [Cefaclor]     Unsure of the side effects  . Cephalexin     Unsure of the side effects  . Ciprofloxacin Itching and Swelling    Pt states that she tolerated po cipro well, but not the higher dose given IV  . Clindamycin/Lincomycin Nausea Only    Upset stomach  . Hydrocodone     Unsure of reaction to med  . Noroxin [Norfloxacin]     Unsure of side effects  . Sulfonamide Derivatives Itching  . Tetracyclines & Related     Unsure of the reaction to this medication  . Tramadol Other (See Comments)    Did not like how she "felt"    MEDICATIONS:  No current facility-administered medications for this encounter.   Current Outpatient Medications  Medication Sig Dispense Refill  . ALPHA LIPOIC ACID PO Take 1 tablet by mouth daily.    [name] aspirin EC 81 MG tablet Take 1 tablet (81 mg total) by mouth  daily. 90 tablet 3  . CRANBERRY PO Take 3 tablets by mouth daily with supper.     . Cyanocobalamin (VITAMIN B-12 PO) Take 1 tablet by mouth at bedtime.    . diphenhydrAMINE (BENADRYL) 25 MG tablet Take 25 mg by mouth as needed (neuropathy).     [name] Calcium (STOOL SOFTENER PO) Take 2 capsules by mouth at bedtime.    [name] EVENING PRIMROSE OIL PO Take 1 capsule by mouth daily.    [name] glucose blood (ONE TOUCH ULTRA TEST) test strip TEST ONCE DAILY AS DIRECTED DX E11.9 100 each 5  . hydrochlorothiazide (MICROZIDE) 12.5 MG capsule Take 1 capsule (12.5 mg total) by mouth daily. (Patient taking differently: Take 12.5 mg by mouth 2 (two) times daily. ) 90 capsule 0  . ipratropium (ATROVENT) 0.06 % nasal spray Place 2 sprays into both nostrils 3 (three) times daily. 45 mL 3  . Lancets (ONETOUCH ULTRASOFT) lancets Use to check blood sugars once a day Dx E11.9 100 each 3  . levothyroxine (SYNTHROID) 100 MCG tablet TAKE 1 TABLET BY MOUTH EVERY DAY BEFORE BREAKFAST 90 tablet 1  . losartan (COZAAR) 50 MG tablet Take 1 tablet (50 mg total) by mouth daily. 90 tablet 3  . methylPREDNISolone (MEDROL DOSEPAK) 4 MG TBPK tablet As directed (Patient not taking: Reported on 04/01/2019) 21 tablet 0  . metoprolol tartrate (LOPRESSOR) 25 MG tablet Take 1 tablet (25 mg total) by mouth 2 (two) times daily. 180 tablet 1  . Multiple Vitamins-Minerals (EYE VITAMINS) CAPS Take 1 capsule by mouth daily.    [name] neomycin-polymyxin-hydrocortisone (CORTISPORIN) OTIC solution Place 3 drops into the left ear 3 (three) times daily. 10 mL 3  . Polyethyl [name] (SYSTANE OP) Apply to eye daily.    . polyethylene glycol (MIRALAX / GLYCOLAX) packet Take 17 g by mouth daily as needed for mild constipation.    . pregabalin (LYRICA) 200 MG capsule Take 1 capsule (200 mg total) by mouth at bedtime. 90 capsule 3  . simvastatin (ZOCOR) 40 MG tablet TAKE 1 TABLET DAILY AT 6PM 90 tablet 3    ROS:                                                                                                                                        ROS was performed and is negative except as noted in HPI  Blood pressure (!) 157/65, pulse 60, temperature (!) 97.4 F (36.3 C), temperature source Oral, resp. rate 17, SpO2 99 %.  General Examination:  Physical Exam  Constitutional: Appears well-developed and well-nourished.  Psych: Affect appropriate to situation Eyes: Normal external eye and conjunctiva. HENT: Normocephalic, no lesions, without obvious abnormality.   Musculoskeletal-no joint tenderness, deformity or swelling Cardiovascular: Normal rate and regular rhythm.  Respiratory: Effort normal, non-labored breathing saturations WNL GI: Soft.  No distension. There is no tenderness.  Skin: WDI  Neurological Examination Mental Status: Alert, oriented, thought content appropriate.  Speech fluent without evidence of aphasia. Naming intact. Can count backward from 20 w/o issue. Able to follow commands without difficulty. Left arm tremor which she states happens from time to time. Cranial Nerves: [id number] fields grossly normal,  III,IV, VI: ptosis not present, extra-ocular motions intact bilaterally pupils equal, round, reactive to light and accommodation V,VII: smile symmetric, facial light touch sensation normal bilaterally VIII: hearing normal bilaterally IX,X: uvula rises symmetrically XI: bilateral shoulder shrug XII: midline tongue extension Motor: Right : Upper extremity   5/5  Left:     Upper extremity   5/5  Lower extremity   5/5  Lower extremity   5/5 Tone and bulk:normal tone throughout; no atrophy noted Sensorylight touch intact throughout, bilaterally Deep Tendon Reflexes: 1+ and symmetric knee jerk, 2+ symmetric biceps Plantars: Right: downgoing   Left: downgoing Cerebellar: No ataxia Gait: deferred   Lab  Results: Basic Metabolic Panel: Recent Labs  Lab 04/30/19 1519 04/30/19 1604  NA 129* 129*  K 3.8 3.9  CL 93* 93*  CO2 27  --   GLUCOSE 169* 161*  BUN 14 17  CREATININE 0.82 0.80  CALCIUM 9.0  --     CBC: Recent Labs  Lab 04/30/19 1519 04/30/19 1604  WBC 6.5  --   NEUTROABS 4.4  --   HGB 12.2 12.2  HCT 37.6 36.0  MCV 97.9  --   PLT 196  --     Imaging: No results found.  Assessment: 86 y.o. female with PMHx of SCC, TIA, hypothyroidism, HTN, DM, bradycardia (s/p PPM) who presented to [facility] ED with HA and vision changes.  1. Cannot obtain MRI d/t pacemaker. 2. CTA head and neck reveals no LVO. Atherosclerotic changes and findings consistent with prior CEA are noted.  3. CTA study incidentally imaged portions of the rostral neck, revealing advanced arthropathy at the C1-2 articulation with prominent pannus, resulting in stenosis at the C1 level and foramen magnum and apparent compressive effect upon the cervicomedullary junction. This may have worsened slightly since the previous exam.  4. Nonfocal Neurological exam is reassuring.  5. Overall presentation of uncertain etiology, but positive visual symptoms with headache are suggestive of a migraine-like phenomenon.   Recommendations: 1. Topamax 25 mg po qd for headache prevention 2. Continue daily ASA 3. Outpatient Neurology follow up.    Laurey Morale, MSN, NP-C [facility] [phone]  I have seen and examined the patient. I have formulated the assessment and recommendations. 86 year old female presenting with headache and bilateral scintillating scotomata. Overall presentation of uncertain etiology, but positive visual symptoms with headache are suggestive of a migraine-like phenomenon. Nonfocal Neurological exam is reassuring. Atherosclerotic disease without vascular occlusion is seen on CTA. Topamax 25 mg po qd, ASA 81 mg po qd and outpatient Neurology follow up are recommended.  Electronically  signed: Dr. Kerney Elbe 04/30/2019, 4:41 PM

## 2019-04-30 NOTE — ED Notes (Signed)
Got patient into a gown on the monitor got patient a warm blanket patient is resting with call bell in reach  

## 2019-04-30 NOTE — ED Provider Notes (Signed)
Patient care assumed at 1500.  Pt here for evaluation of HA, transient vision changes in bilateral eyes lasting about 40 minutes - now resolved.  CTA, labs and neuro consult pending.    CTA negative for acute abnormality.  Patient has pacemaker and is unable to get MRI.  Headache has been ongoing for one month.  No evidence of acute infectious process or zoster.  Discussed case with Neurologist, Dr. Cheral Marker.  Will treat HA with topamax.    UA concerning for UTI - will send culture and treat with macrobid.  Pt with hyponatremia - chronic.  Discussed with patient and son home care, outpatient follow up and return precautions.     Quintella Reichert, MD 04/30/19 [phone]

## 2019-05-03 ENCOUNTER — Telehealth: Payer: Self-pay

## 2019-05-03 LAB — URINE CULTURE: Culture: >=100000 — AB

## 2019-05-03 MED ORDER — PREGABALIN 200 MG PO CAPS: 200.0000 mg | ORAL_CAPSULE | Freq: Every day | ORAL | 3 refills | Status: DC

## 2019-05-03 NOTE — Telephone Encounter (Signed)
Pt received vaccine  Copied from [place] [id number]. Topic: General - Call Back - No Documentation >> Apr 22, 2019  3:22 PM Erick Blinks wrote: Pt called and would like to know if she can take the vaccine tomorrow, she has a lot of allergies. [phone]

## 2019-05-04 ENCOUNTER — Telehealth: Payer: Self-pay

## 2019-05-04 NOTE — Telephone Encounter (Signed)
Post ED Visit - Positive Culture Follow-up  Culture report reviewed by antimicrobial stewardship pharmacist: [facility] Team []  Elenor Quinones, Pharm.D. []  Heide Guile, Pharm.D., BCPS AQ-ID []  Parks Neptune, Pharm.D., BCPS []  Alycia Rossetti, Pharm.D., BCPS []  [place], [place].D., BCPS, AAHIVP []  [name], Pharm.D., BCPS, AAHIVP [x]  Salome Arnt, PharmD, BCPS []  Johnnette Gourd, PharmD, BCPS []  Hughes Better, PharmD, BCPS []  Leeroy Cha, PharmD []  Laqueta Linden, PharmD, BCPS []  Albertina Parr, PharmD  [place] Team []  Leodis Sias, PharmD []  Lindell Spar, PharmD []  Royetta Asal, PharmD []  Graylin Shiver, Rph []  Rema Fendt) Glennon Mac, PharmD []  Arlyn Dunning, PharmD []  Netta Cedars, PharmD []  Dia Sitter, PharmD []  Leone Haven, PharmD []  Gretta Arab, PharmD []  Theodis Shove, PharmD []  Peggyann Juba, PharmD []  Reuel Boom, PharmD   Positive urine culture Treated with Microbid, organism sensitive to the same and no further patient follow-up is required at this time.  Genia Del 05/04/2019, 10:35 AM

## 2019-05-04 NOTE — Telephone Encounter (Signed)
Copied from [place] [phone]. Topic: General - Other >> May 03, 2019  3:47 PM Greggory Keen D wrote: Reason for CRM:  Pt called saying she went to the ER this past Saturday after for a bad headache, earache and facial pain.  She was put on a medication that can cause seizures.  She is concerned and would like Dr. Alain Marion to review her meds and advise her what to do.  [phone]

## 2019-05-06 NOTE — Telephone Encounter (Signed)
Pt informed of below.  

## 2019-05-06 NOTE — Telephone Encounter (Signed)
It is okay to take Topamax and nitrofurantoin.  Thanks

## 2019-05-10 ENCOUNTER — Other Ambulatory Visit: Payer: Self-pay | Admitting: Internal Medicine

## 2019-05-11 ENCOUNTER — Other Ambulatory Visit: Payer: Self-pay

## 2019-05-11 MED ORDER — LEVOTHYROXINE SODIUM 100 MCG PO TABS: 100.0000 ug | ORAL_TABLET | Freq: Every day | ORAL | 0 refills | Status: DC

## 2019-05-11 MED ORDER — PREGABALIN 200 MG PO CAPS: 200.0000 mg | ORAL_CAPSULE | Freq: Every day | ORAL | 3 refills | Status: DC

## 2019-05-11 MED ORDER — LEVOTHYROXINE SODIUM 100 MCG PO TABS: ORAL_TABLET | ORAL | 0 refills | Status: DC

## 2019-05-11 MED ORDER — PREGABALIN 200 MG PO CAPS: 200.0000 mg | ORAL_CAPSULE | Freq: Every day | ORAL | 0 refills | Status: DC

## 2019-05-11 NOTE — Addendum Note (Signed)
Addended by: Karren Cobble on: 05/11/2019 09:48 AM   Modules accepted: Orders

## 2019-05-11 NOTE — ED Provider Notes (Signed)
[facility] EMERGENCY DEPARTMENT Provider Note   CSN: [id number] Arrival date & time: 04/30/19  1402     History Chief Complaint  Patient presents with  . Eye Problem  . Visual Field Change    Kayla Davis is a 86 y.o. female.  HPI     86 year old female comes in with chief complaint of visual field disturbance and headaches.  Patient has history of diabetes, TIA and valvular disorder.  She reports that she was reading her mail and all of a sudden she started noticing squiggly lines in her visual fields.  She is unsure if the problem was in 1 eye or both of her eyes but suspects that she had trouble in both of her eyes.  She also reports having some headache and neck pain on the left side for the last few days.  Patient denies any history of similar headaches.  She denies any new dizziness, focal weakness or numbness, slurred speech.  Her vision is now back to normal.  She suspects that her visual disturbance lasted for 15 to 30 minutes.  She called her PCP who advised that she come to the ER.  Past Medical History:  Diagnosis Date  . Anemia, unspecified   . Anxiety state, unspecified   . Aortic valve disorders   . Bradycardia    s/p PPM  . Bursitis   . Complication of anesthesia    has had difficulty awakening in past; pt states did well with the anesthesia given during her carotid surgery   . Diabetes mellitus without complication ([place])   . Diverticulosis of colon (without mention of hemorrhage)   . Herpes zoster with other nervous system complications(053.19)   . Hyperlipidemia   . Hypertension   . Hypothyroidism   . Irritable bowel syndrome   . Menopause   . Osteoarthrosis, unspecified whether generalized or localized, unspecified site   . Peripheral vascular disease ([place])   . Squamous cell carcinoma of skin 04/21/2012   left sideburn - tx p bx  . Squamous cell carcinoma of skin 01/10/2013   recurrent on left sideburn - tx p bx  . Squamous  cell carcinoma of skin 08/22/2013   left sideburn - tx p bx  . TIA (transient ischemic attack)   . Unspecified hearing loss   . Varicose veins     Patient Active Problem List   Diagnosis Date Noted  . Gout attack 03/07/2019  . Otitis externa 03/07/2019  . Hematuria 11/20/2017  . Cough 11/20/2017  . Colon cancer screening 08/04/2017  . Abdominal pain 03/13/2017  . Amaurosis 07/03/2016  . Diet-controlled type 2 diabetes mellitus ([place]) 02/29/2016  . Vertigo 12/11/2015  . Gait disorder 06/27/2015  . OA (osteoarthritis) of hip 03/23/2015  . Preop exam for internal medicine 03/04/2015  . Shortness of breath 02/13/2015  . Rhinitis, atrophic 11/22/2014  . Well adult exam 04/21/2014  . Amaurosis fugax 03/31/2014  . History of CEA (carotid endarterectomy) 03/31/2014  . HLD (hyperlipidemia) 03/31/2014  . Carotid stenosis 02/07/2014  . Cerebral infarction ([place]) 01/16/2014  . Dyslipidemia 01/16/2014  . Weakness generalized 04/30/2013  . CAP (community acquired pneumonia) 04/29/2013  . Edema 03/22/2013  . Urinary incontinence in female 03/22/2013  . Constipation 10/01/2012  . UTI (urinary tract infection) 10/01/2012  . Diarrhea 08/26/2012  . Cerumen impaction 12/11/2011  . Pacemaker-Medtronic 07/30/2010  . SINUS BRADYCARDIA 05/17/2010  . DYSURIA 01/29/2010  . Aortic valve disorders 07/31/2009  . Venous (peripheral) insufficiency 07/31/2009  .  Hyponatremia 07/01/2009  . LEG CRAMPS 06/12/2009  . LEG PAIN, BILATERAL 02/01/2009  . Backache 08/24/2008  . Anemia 06/21/2008  . DEGENERATIVE JOINT DISEASE 02/19/2008  . CHEST WALL PAIN, HX OF 02/19/2008  . IRRITABLE BOWEL SYNDROME 12/28/2007  . DIABETES MELLITUS, BORDERLINE 11/06/2007  . GASTRITIS 07/08/2007  . POSTHERPETIC NEURALGIA 07/01/2007  . Herpes zoster without mention of complication [id number]  . Hypothyroidism 06/08/2007  . HYPERLIPIDEMIA 06/08/2007  . Anxiety state 06/08/2007  . Hereditary and idiopathic peripheral  neuropathy 06/08/2007  . HEARING LOSS 06/08/2007  . Essential hypertension 06/08/2007  . Transient cerebral ischemia 06/08/2007  . BRONCHITIS, RECURRENT 06/08/2007  . DIVERTICULOSIS OF COLON 06/08/2007  . Osteoporosis 06/08/2007    Past Surgical History:  Procedure Laterality Date  . APPENDECTOMY    . CATARACT EXTRACTION, BILATERAL  end of June 2013  . CHOLECYSTECTOMY    . DILATION AND CURETTAGE OF UTERUS    . ENDARTERECTOMY Right 01/20/2014   Procedure: RIGHT CAROTID ARTERY ENDARTERECTOMY WITH DACRON PATCH ANGIOPLASTY;  Surgeon: Mal Misty, MD;  Location: [facility];  Service: Vascular;  Laterality: Right;  . PACEMAKER INSERTION     MDT dual chamber PPM implanted by Dr [name] for sinus node dysfunction   . TONSILLECTOMY    . TOTAL HIP ARTHROPLASTY Left 03/23/2015   Procedure: TOTAL HIP ARTHROPLASTY ANTERIOR APPROACH;  Surgeon: Gaynelle Arabian, MD;  Location: [facility];  Service: Orthopedics;  Laterality: Left;  [name] VAGINAL HYSTERECTOMY       OB History   No obstetric history on file.     Family History  Problem Relation Age of Onset  . Heart disease Mother   . Heart disease Father   . Diabetes Father   . Cancer Paternal Grandmother     Social History   Tobacco Use  . Smoking status: Never Smoker  . Smokeless tobacco: Never Used  Substance Use Topics  . Alcohol use: No    Alcohol/week: 0.0 standard drinks  . Drug use: No    Home Medications Prior to Admission medications   Medication Sig Start Date End Date Taking? Authorizing Provider  ALPHA LIPOIC ACID PO Take 1 tablet by mouth daily.    [provider]  aspirin EC 81 MG tablet Take 1 tablet (81 mg total) by mouth daily. 01/28/19   [name], Jeneen Rinks, MD  CRANBERRY PO Take 3 tablets by mouth daily with supper.     [provider]  Cyanocobalamin (VITAMIN B-12 PO) Take 1 tablet by mouth at bedtime.    [provider]  diphenhydrAMINE (BENADRYL) 25 MG tablet Take 25 mg by mouth as needed  (neuropathy).     [provider]  Docusate Calcium (STOOL SOFTENER PO) Take 2 capsules by mouth at bedtime.    [provider]  EVENING PRIMROSE OIL PO Take 1 capsule by mouth daily.    [provider]  glucose blood (ONE TOUCH ULTRA TEST) test strip TEST ONCE DAILY AS DIRECTED DX E11.9 08/10/18   Plotnikov, Evie Lacks, MD  hydrochlorothiazide (MICROZIDE) 12.5 MG capsule Take 1 capsule (12.5 mg total) by mouth daily. Patient taking differently: Take 12.5 mg by mouth 2 (two) times daily.  08/19/18   Plotnikov, Evie Lacks, MD  ipratropium (ATROVENT) 0.06 % nasal spray Place 2 sprays into both nostrils 3 (three) times daily. 03/09/19   Plotnikov, Evie Lacks, MD  Lancets [facility] ULTRASOFT) lancets Use to check blood sugars once a day Dx E11.9 08/19/18   Plotnikov, Evie Lacks, MD  levothyroxine (SYNTHROID)  100 MCG tablet Take 1 tablet (100 mcg total) by mouth daily before breakfast. 05/11/19   Plotnikov, Evie Lacks, MD  losartan (COZAAR) 50 MG tablet Take 1 tablet (50 mg total) by mouth daily. 01/28/19   [name], Jeneen Rinks, MD  methylPREDNISolone (MEDROL DOSEPAK) 4 MG TBPK tablet As directed Patient not taking: Reported on 04/01/2019 03/07/19   Plotnikov, Evie Lacks, MD  metoprolol tartrate (LOPRESSOR) 25 MG tablet Take 1 tablet (25 mg total) by mouth 2 (two) times daily. 09/20/18   Plotnikov, Evie Lacks, MD  Multiple Vitamins-Minerals (EYE VITAMINS) CAPS Take 1 capsule by mouth daily.    [provider]  neomycin-polymyxin-hydrocortisone (CORTISPORIN) OTIC solution Place 3 drops into the left ear 3 (three) times daily. 03/07/19 06/15/19  Plotnikov, Evie Lacks, MD  nitrofurantoin, macrocrystal-monohydrate, (MACROBID) 100 MG capsule Take 1 capsule (100 mg total) by mouth 2 (two) times daily. 04/30/19   Quintella Reichert, MD  Polyethyl [name] (SYSTANE OP) Apply to eye daily.    [provider]  polyethylene glycol (MIRALAX / GLYCOLAX) packet Take 17 g by mouth daily as  needed for mild constipation.    [provider]  pregabalin (LYRICA) 200 MG capsule Take 1 capsule (200 mg total) by mouth at bedtime. 05/11/19   Narda Amber K, DO  simvastatin (ZOCOR) 40 MG tablet TAKE 1 TABLET DAILY AT 6PM 01/04/19   Plotnikov, Evie Lacks, MD  topiramate (TOPAMAX) 25 MG tablet Take 1 tablet (25 mg total) by mouth daily. 04/30/19   Quintella Reichert, MD    Allergies    Penicillins, Alprazolam, Bactrim [sulfamethoxazole-trimethoprim], Ceclor [cefaclor], Cephalexin, Ciprofloxacin, Clindamycin/lincomycin, Hydrocodone, Noroxin [norfloxacin], Sulfonamide derivatives, Tetracyclines & related, and Tramadol  Review of Systems   Review of Systems  Constitutional: Positive for activity change.  Eyes: Positive for visual disturbance.  Allergic/Immunologic: Negative for immunocompromised state.  Neurological: Positive for headaches. Negative for dizziness.  Hematological: Does not bruise/bleed easily.  All other systems reviewed and are negative.   Physical Exam Updated Vital Signs BP 115/90   Pulse 60   Temp (!) 97.4 F (36.3 C) (Oral)   Resp 15   LMP  (LMP Unknown)   SpO2 98%   Physical Exam Vitals and nursing note reviewed.  Constitutional:      Appearance: She is well-developed.  HENT:     Head: Normocephalic and atraumatic.  Eyes:     Extraocular Movements: Extraocular movements intact.     Pupils: Pupils are equal, round, and reactive to light.  Cardiovascular:     Rate and Rhythm: Normal rate.  Pulmonary:     Effort: Pulmonary effort is normal.  Abdominal:     General: Bowel sounds are normal.  Musculoskeletal:     Cervical back: Normal range of motion and neck supple.  Skin:    General: Skin is warm and dry.  Neurological:     Mental Status: She is alert and oriented to person, place, and time.     Cranial Nerves: No cranial nerve deficit.     Sensory: No sensory deficit.     Comments: Cerebellar exam is normal (finger to nose) Sensory exam  normal for bilateral upper and lower extremities - and patient is able to discriminate between sharp and dull. Motor exam is 4+/5      ED Results / Procedures / Treatments   Labs (all labs ordered are listed, but only abnormal results are displayed) Labs Reviewed  URINE CULTURE - Abnormal; Notable for the following components:  Result Value   Culture >=100,000 COLONIES/mL ESCHERICHIA COLI (*)    Organism ID, Bacteria ESCHERICHIA COLI (*)    All other components within normal limits  CBC - Abnormal; Notable for the following components:   RBC 3.84 (*)    All other components within normal limits  COMPREHENSIVE METABOLIC PANEL - Abnormal; Notable for the following components:   Sodium 129 (*)    Chloride 93 (*)    Glucose, Bld 169 (*)    Total Protein 6.1 (*)    All other components within normal limits  URINALYSIS, ROUTINE W REFLEX MICROSCOPIC - Abnormal; Notable for the following components:   APPearance HAZY (*)    Leukocytes,Ua LARGE (*)    Bacteria, UA MANY (*)    All other components within normal limits  I-STAT CHEM 8, ED - Abnormal; Notable for the following components:   Sodium 129 (*)    Chloride 93 (*)    Glucose, Bld 161 (*)    All other components within normal limits  PROTIME-INR  APTT  DIFFERENTIAL  RAPID URINE DRUG SCREEN, HOSP PERFORMED    EKG EKG Interpretation  Date/Time:  Saturday April 30 2019 14:09:24 EST Ventricular Rate:  69 PR Interval:  408 QRS Duration: 148 QT Interval:  452 QTC Calculation: 484 R Axis:   -54 Text Interpretation: ATRIAL PACED RHYTHM Left axis deviation Left bundle branch block Abnormal ECG Confirmed by Quintella Reichert [phone]) on 04/30/2019 2:57:43 PM   Radiology No results found.  Procedures Procedures (including critical care time)  Medications Ordered in ED Medications  iohexol (OMNIPAQUE) 350 MG/ML injection 50 mL (50 mLs Intravenous Contrast Given 04/30/19 1634)    ED Course  I have reviewed the triage  vital signs and the nursing notes.  Pertinent labs & imaging results that were available during my care of the patient were reviewed by me and considered in my medical decision making (see chart for details).    MDM Rules/Calculators/A&P                       86 year old female comes in a chief complaint of headaches and visual disturbance.  It appears that she had binocular visual disturbance and it does not appear to be complete vision loss.  She does not have any history of headaches but is also complaining of new headaches and some posterior and head and neck pain.  Differential diagnosis includes complex headache, carotid or vertebral artery dissection or aneurysm.  TIA considered to be less likely.  This does not appear to be temporal arteritis and on our evaluation she does not have reproducible tenderness with palpation of the temples.  I have consulted neurology for new onset headache with some visual disturbance. I have ordered CT angiogram head and neck and signed out care to Dr. Ralene Bathe.  She will follow up on the CAT scan and the recommendations from neurology.  Patient is currently stable and has been made aware of the plan. Final Clinical Impression(s) / ED Diagnoses Final diagnoses:  Bad headache  Acute lower UTI (urinary tract infection)  Hyponatremia    Rx / DC Orders ED Discharge Orders         Ordered    nitrofurantoin, macrocrystal-monohydrate, (MACROBID) 100 MG capsule  2 times daily     04/30/19 1830    topiramate (TOPAMAX) 25 MG tablet  Daily     04/30/19 1830           Varney Biles, MD 05/11/19  1548  

## 2019-05-12 ENCOUNTER — Other Ambulatory Visit: Payer: Self-pay | Admitting: Internal Medicine

## 2019-05-13 ENCOUNTER — Telehealth: Payer: Self-pay | Admitting: Neurology

## 2019-05-13 ENCOUNTER — Ambulatory Visit: Payer: MEDICARE

## 2019-05-13 NOTE — Telephone Encounter (Signed)
Pharm left msg about prescription just sent in for patient and needing some info. Thanks! That was all info given.

## 2019-05-14 ENCOUNTER — Ambulatory Visit: Payer: MEDICARE | Attending: Internal Medicine

## 2019-05-14 DIAGNOSIS — Z23 Encounter for immunization: Secondary | ICD-10-CM | POA: Insufficient documentation

## 2019-05-14 NOTE — Progress Notes (Signed)
   Covid-19 Vaccination Clinic  Name:  Kayla Davis    MRN: [id number] DOB: 1922/08/22  05/14/2019  [name] was observed post Covid-19 immunization for 15 minutes without incidence. She was provided with Vaccine Information Sheet and instruction to access the V-Safe system.   [name] was instructed to call 911 with any severe reactions post vaccine: [name] Difficulty breathing  . Swelling of your face and throat  . A fast heartbeat  . A bad rash all over your body  . Dizziness and weakness    Immunizations Administered    Name Date Dose VIS Date Route   Pfizer COVID-19 Vaccine 05/14/2019  1:40 PM 0.3 mL 03/25/2019 Intramuscular   Manufacturer: [place]   Lot: [id number]   [place]: [id number]

## 2019-05-25 DIAGNOSIS — L570 Actinic keratosis: Secondary | ICD-10-CM | POA: Diagnosis not present

## 2019-05-25 DIAGNOSIS — D229 Melanocytic nevi, unspecified: Secondary | ICD-10-CM | POA: Diagnosis not present

## 2019-05-26 NOTE — Progress Notes (Signed)
No PA needed for PRegabalin 20mg  pr covermymeds  Key [id number]

## 2019-06-06 NOTE — Telephone Encounter (Signed)
Patient's son returned call. He is not sure what this is about either. He will reach out to his mom, the patient, for more information.

## 2019-06-07 ENCOUNTER — Encounter: Payer: Self-pay | Admitting: Neurology

## 2019-06-09 ENCOUNTER — Ambulatory Visit: Payer: MEDICARE | Admitting: Internal Medicine

## 2019-06-10 ENCOUNTER — Telehealth (INDEPENDENT_AMBULATORY_CARE_PROVIDER_SITE_OTHER): Payer: MEDICARE | Admitting: Neurology

## 2019-06-10 ENCOUNTER — Other Ambulatory Visit: Payer: Self-pay

## 2019-06-10 DIAGNOSIS — G609 Hereditary and idiopathic neuropathy, unspecified: Secondary | ICD-10-CM

## 2019-06-10 DIAGNOSIS — M542 Cervicalgia: Secondary | ICD-10-CM | POA: Diagnosis not present

## 2019-06-10 MED ORDER — PREGABALIN 200 MG PO CAPS: 200.0000 mg | ORAL_CAPSULE | Freq: Every day | ORAL | 1 refills | Status: DC

## 2019-06-10 MED ORDER — TIZANIDINE HCL 2 MG PO TABS: 2.0000 mg | ORAL_TABLET | Freq: Every evening | ORAL | 0 refills | Status: DC | PRN

## 2019-06-10 NOTE — Progress Notes (Signed)
   Due to the COVID-19 crisis, this virtual check-in visit was done via telephone from my office and it was initiated and consent given by this patient and or family.   Telephone (Audio) Visit The purpose of this telephone visit is to provide medical care while limiting exposure to the novel coronavirus.    Consent was obtained for telephone visit and initiated by pt/family:  Yes.   Answered questions that patient had about telehealth interaction:  Yes.   I discussed the limitations, risks, security and privacy concerns of performing an evaluation and management service by telephone. I also discussed with the patient that there may be a patient responsible charge related to this service. The patient expressed understanding and agreed to proceed.  Pt location: Home Physician Location: office Name of referring provider:  Jodi Marble, MD I connected with .Kayla Davis at patients initiation/request on 06/10/2019 at  3:30 PM EST by telephone and verified that I am speaking with the correct person using two identifiers.  Pt MRN:  [id number] Pt DOB:  08-05-1922   History of Present Illness: This is a 86 year-old female returning with new complaints of left facial pain and was previously seen here for neuropathy. Over the last few months, she has been having severe pain over the left side of her ear, neck, and face. Pain felt like a "earache".  It was not shooting, electrical, tingling, or numb. Pain was not triggered by chewing or talking.  She was treating pain with tylenol and ibuprofen which helped ease the pain some.   In January, pain intensified and she was evaluated by ENT who did not find any problems related to her ear and had temporal artery biopsy which was normal.  On January 16th, she began having wavy lines in her vision so went to the ER.  She is unable to to get MRI due to PPM.  CT/A head showed mild atherosclerosis changes, no severe stenosis or aneurysm.  She was started on  topiramate 25mg  daily which she took for 2 weeks and completed in the end of January.  Surprisingly, her pain has improved and nearly resolved over the past week.  She has received both COVID vaccines.    Assessment and Plan:   1. Left facial, ear, and neck pain.  Pain is not characteristic of nerve-related pathology.  She denies tingling, numbness, or shooting sensation.  History does not support trigeminal neuraglia or occipital neuralgia.  Its possible this may be musculoskeletal in nature.   I have offered a trial of tizanidine 2mg  at bedtime, if pain returns Start gentle neck stretching exercises at home.  Consider neck PT going forward, if needed  2. Idiopathic peripheral neuropathy, stable. Continue Lyrica 200mg  at bedtime - refills sent   Follow Up Instructions:   I discussed the assessment and treatment plan with the patient. The patient was provided an opportunity to ask questions and all were answered. The patient agreed with the plan and demonstrated an understanding of the instructions.   The patient was advised to call back or seek an in-person evaluation if the symptoms worsen or if the condition fails to improve as anticipated.   Total Time spent in visit with the patient was:  24 min, of which 100% of the time was spent in counseling and/or coordinating care.   Pt understands and agrees with the plan of care outlined.     [name], DO

## 2019-06-13 ENCOUNTER — Other Ambulatory Visit: Payer: Self-pay

## 2019-06-14 ENCOUNTER — Encounter: Payer: Self-pay | Admitting: Internal Medicine

## 2019-06-14 ENCOUNTER — Other Ambulatory Visit: Payer: Self-pay

## 2019-06-14 ENCOUNTER — Ambulatory Visit (INDEPENDENT_AMBULATORY_CARE_PROVIDER_SITE_OTHER): Payer: MEDICARE | Admitting: Internal Medicine

## 2019-06-14 VITALS — BP 142/76 | HR 68 | Temp 98.1°F | Ht 65.0 in | Wt 161.0 lb

## 2019-06-14 DIAGNOSIS — G44049 Chronic paroxysmal hemicrania, not intractable: Secondary | ICD-10-CM

## 2019-06-14 DIAGNOSIS — E119 Type 2 diabetes mellitus without complications: Secondary | ICD-10-CM | POA: Diagnosis not present

## 2019-06-14 DIAGNOSIS — R269 Unspecified abnormalities of gait and mobility: Secondary | ICD-10-CM

## 2019-06-14 LAB — BASIC METABOLIC PANEL
BUN: 25 mg/dL — ABNORMAL HIGH (ref 6–23)
CO2: 32 mEq/L (ref 19–32)
Calcium: 9.4 mg/dL (ref 8.4–10.5)
Chloride: 95 mEq/L — ABNORMAL LOW (ref 96–112)
Creatinine, Ser: 1.01 mg/dL (ref 0.40–1.20)
GFR: 50.99 mL/min — ABNORMAL LOW (ref >60.00)
Glucose, Bld: 76 mg/dL (ref 70–99)
Potassium: 4.7 mEq/L (ref 3.5–5.1)
Sodium: 132 mEq/L — ABNORMAL LOW (ref 135–145)

## 2019-06-14 LAB — SEDIMENTATION RATE: Sed Rate: 10 mm/hr (ref 0–30)

## 2019-06-14 NOTE — Assessment & Plan Note (Signed)
BMET 

## 2019-06-14 NOTE — Progress Notes (Signed)
Subjective:  Patient ID: Kayla Davis, female    DOB: Mar 30, 1923  Age: 86 y.o. MRN: [id number]  CC: No chief complaint on file.   HPI Kayla Davis presents for L fascial pain/L ear ache - resolved C/o blurred vision in Jan No sx's now  F/u HTN, hypothyroidism f/u  Outpatient Medications Prior to Visit  Medication Sig Dispense Refill  . ALPHA LIPOIC ACID PO Take 1 tablet by mouth daily.    [name] aspirin EC 81 MG tablet Take 1 tablet (81 mg total) by mouth daily. 90 tablet 3  . CRANBERRY PO Take 3 tablets by mouth daily with supper.     . Cyanocobalamin (VITAMIN B-12 PO) Take 1 tablet by mouth at bedtime.    Kayla Davis Calcium (STOOL SOFTENER PO) Take 2 capsules by mouth at bedtime.    [name] EVENING PRIMROSE OIL PO Take 1 capsule by mouth daily.    [name] glucose blood (ONE TOUCH ULTRA TEST) test strip TEST ONCE DAILY AS DIRECTED DX E11.9 100 each 5  . hydrochlorothiazide (MICROZIDE) 12.5 MG capsule Take 1 capsule (12.5 mg total) by mouth daily. (Patient taking differently: Take 12.5 mg by mouth 2 (two) times daily. ) 90 capsule 0  . ipratropium (ATROVENT) 0.06 % nasal spray Place 2 sprays into both nostrils 3 (three) times daily. 45 mL 3  . Lancets (ONETOUCH ULTRASOFT) lancets Use to check blood sugars once a day Dx E11.9 100 each 3  . levothyroxine (SYNTHROID) 100 MCG tablet Take 1 tablet (100 mcg total) by mouth daily before breakfast. 90 tablet 0  . losartan (COZAAR) 50 MG tablet Take 1 tablet (50 mg total) by mouth daily. 90 tablet 3  . metoprolol tartrate (LOPRESSOR) 25 MG tablet Take 1 tablet (25 mg total) by mouth 2 (two) times daily. 180 tablet 1  . Multiple Vitamins-Minerals (EYE VITAMINS) CAPS Take 1 capsule by mouth daily.    Kayla Davis [name] (SYSTANE OP) Apply to eye daily.    . polyethylene glycol (MIRALAX / GLYCOLAX) packet Take 17 g by mouth daily as needed for mild constipation.    . pregabalin (LYRICA) 200 MG capsule Take 1 capsule (200 mg total) by mouth  at bedtime. 90 capsule 1  . simvastatin (ZOCOR) 40 MG tablet TAKE 1 TABLET DAILY AT 6PM 90 tablet 3  . tiZANidine (ZANAFLEX) 2 MG tablet Take 1 tablet (2 mg total) by mouth at bedtime as needed for muscle spasms. 20 tablet 0  . topiramate (TOPAMAX) 25 MG tablet Take 1 tablet (25 mg total) by mouth daily. 14 tablet 0  . diphenhydrAMINE (BENADRYL) 25 MG tablet Take 25 mg by mouth as needed (neuropathy).     . methylPREDNISolone (MEDROL DOSEPAK) 4 MG TBPK tablet As directed (Patient not taking: Reported on 06/14/2019) 21 tablet 0  . neomycin-polymyxin-hydrocortisone (CORTISPORIN) OTIC solution Place 3 drops into the left ear 3 (three) times daily. (Patient not taking: Reported on 06/14/2019) 10 mL 3   No facility-administered medications prior to visit.    ROS: Review of Systems  Constitutional: Negative for activity change, appetite change, chills, fatigue and unexpected weight change.  HENT: Negative for congestion, mouth sores and sinus pressure.   Eyes: Negative for visual disturbance.  Respiratory: Negative for cough and chest tightness.   Gastrointestinal: Negative for abdominal pain and nausea.  Genitourinary: Negative for difficulty urinating, frequency and vaginal pain.  Musculoskeletal: Positive for gait problem. Negative for back pain.  Skin: Negative for pallor and rash.  Neurological:  Negative for dizziness, tremors, weakness, numbness and headaches.  Psychiatric/Behavioral: Negative for confusion, sleep disturbance and suicidal ideas.    Objective:  BP (!) 142/76 (BP Location: Left Arm, Patient Position: Sitting, Cuff Size: Normal)   Pulse 68   Temp 98.1 F (36.7 C) (Oral)   Ht 5\' 5"  (1.651 m)   Wt 161 lb (73 kg)   LMP  (LMP Unknown)   SpO2 97%   BMI 26.79 kg/m   BP Readings from Last 3 Encounters:  06/14/19 (!) 142/76  04/30/19 115/90  01/28/19 (!) 156/93    Wt Readings from Last 3 Encounters:  06/14/19 161 lb (73 kg)  01/28/19 150 lb (68 kg)  06/24/18 156 lb  (70.8 kg)    Physical Exam Constitutional:      General: She is not in acute distress.    Appearance: She is well-developed.  HENT:     Head: Normocephalic.     Right Ear: External ear normal.     Left Ear: External ear normal.     Nose: Nose normal.  Eyes:     General:        Right eye: No discharge.        Left eye: No discharge.     Conjunctiva/sclera: Conjunctivae normal.     Pupils: Pupils are equal, round, and reactive to light.  Neck:     Thyroid: No thyromegaly.     Vascular: No JVD.     Trachea: No tracheal deviation.  Cardiovascular:     Rate and Rhythm: Normal rate and regular rhythm.     Heart sounds: Normal heart sounds.  Pulmonary:     Effort: No respiratory distress.     Breath sounds: No stridor. No wheezing.  Abdominal:     General: Bowel sounds are normal. There is no distension.     Palpations: Abdomen is soft. There is no mass.     Tenderness: There is no abdominal tenderness. There is no guarding or rebound.  Musculoskeletal:        General: No tenderness.     Cervical back: Normal range of motion and neck supple.  Lymphadenopathy:     Cervical: No cervical adenopathy.  Skin:    Findings: No erythema or rash.  Neurological:     Cranial Nerves: No cranial nerve deficit.     Motor: No abnormal muscle tone.     Coordination: Coordination normal.     Deep Tendon Reflexes: Reflexes normal.  Psychiatric:        Behavior: Behavior normal.        Thought Content: Thought content normal.        Judgment: Judgment normal.     Lab Results  Component Value Date   WBC 6.5 04/30/2019   HGB 12.2 04/30/2019   HCT 36.0 04/30/2019   PLT 196 04/30/2019   GLUCOSE 161 (H) 04/30/2019   CHOL 162 12/24/2016   TRIG 97.0 12/24/2016   HDL 80.70 12/24/2016   LDLDIRECT 143.0 10/07/2007   LDLCALC 62 12/24/2016   ALT 18 04/30/2019   AST 21 04/30/2019   NA 129 (L) 04/30/2019   K 3.9 04/30/2019   CL 93 (L) 04/30/2019   CREATININE 0.80 04/30/2019   BUN 17  04/30/2019   CO2 27 04/30/2019   TSH 10.14 (H) 04/15/2018   INR 1.1 04/30/2019   HGBA1C 6.5 04/15/2018    CT Angio Head W or Wo Contrast  Result Date: 04/30/2019 CLINICAL DATA:  Acute neck pain. Headache and blurred  vision. Question TIA. EXAM: CT ANGIOGRAPHY HEAD AND NECK TECHNIQUE: Multidetector CT imaging of the head and neck was performed using the standard protocol during bolus administration of intravenous contrast. Multiplanar CT image reconstructions and MIPs were obtained to evaluate the vascular anatomy. Carotid stenosis measurements (when applicable) are obtained utilizing NASCET criteria, using the distal internal carotid diameter as the denominator. CONTRAST:  40mL OMNIPAQUE IOHEXOL 350 MG/ML SOLN COMPARISON:  Head CT 06/18/2018.  CT angiography 06/01/2016. FINDINGS: CT HEAD FINDINGS Brain: Age related atrophy. Chronic small-vessel ischemic changes of the cerebral hemispheric white matter. No sign of acute infarction, mass lesion, hemorrhage, hydrocephalus or extra-axial collection. Vascular: There is atherosclerotic calcification of the major vessels at the base of the brain. Skull: Negative Sinuses: Clear Orbits: Normal Review of the MIP images confirms the above findings CTA NECK FINDINGS Aortic arch: Aortic atherosclerosis. No aneurysm or dissection. Branching pattern is normal. No flow limiting origin stenosis. 30% stenosis of the left subclavian origin. Right carotid system: Common carotid artery widely patent to the bifurcation. Few scattered foci of plaque but no stenosis. Carotid bifurcation region is widely patent, previous carotid endarterectomy. No stenosis or irregularity. Cervical ICA widely patent. Left carotid system: Common carotid artery shows scattered plaque but no stenosis and is widely patent to the bifurcation. Extensive calcified plaque at the carotid bifurcation and ICA bulb. Minimal diameter in the ICA bulb is 3.5 mm. Compared to a more distal cervical ICA diameter of  4.5 mm, this indicates a 15-20% stenosis. Vertebral arteries: Atherosclerotic plaque at both vertebral artery origins but no stenosis greater than 30%. Both vertebral arteries widely patent through the cervical region to the foramen magnum. Bilateral subclavian artery stenosis beyond the vertebral arteries. 50-70% on the left and 30-50% on the right. Skeleton: Degenerative cervical spondylosis. Advanced arthropathy at the C1-2 articulation with prominent pannus. This encroaches upon the spinal canal and foramen magnum and appears to have some compressive effect in the region of the cervicomedullary junction. Other neck: No soft tissue mass or lymphadenopathy. Upper chest: Mild scarring at the lung apices. Review of the MIP images confirms the above findings CTA HEAD FINDINGS Anterior circulation: Both internal carotid arteries are patent through the skull base and siphon regions. There is siphon atherosclerotic calcification but no stenosis greater than 30%. The anterior and middle cerebral vessels are patent. No large or medium vessel occlusion. No correctable proximal stenosis. No aneurysm. Posterior circulation: Both vertebral arteries are patent through the foramen magnum to the basilar. Atherosclerotic change in both vertebral artery V4 segments with stenosis on the order of 30-50% on each side. No basilar stenosis. Posterior circulation branch vessels are patent. Venous sinuses: Patent and normal. Anatomic variants: None significant. Since the study of 2018, stenotic disease of the left proximal ICA is slightly worsened. Review of the MIP images confirms the above findings IMPRESSION: No acute large or medium vessel occlusion. Aortic atherosclerosis without aneurysm or dissection. Right carotid bifurcation widely patent status post previous carotid endarterectomy. Atherosclerotic disease at the left carotid bifurcation and ICA bulb. Maximal stenosis of the ICA bulb is 20%. Plaque at both vertebral artery  origins with narrowing estimated at 30%. Calcified plaque in both carotid siphons but without stenosis greater than 30%. 30-50% atherosclerotic narrowing of both vertebral artery V4 segments. Both vessels do supply the basilar. Advanced arthropathy at the C1-2 articulation with prominent pannus, resulting in stenosis at the C1 level and foramen magnum and apparent compressive effect upon the cervicomedullary junction. This may have worsened slightly since  the previous exam. Electronically Signed   By: Nelson Chimes M.D.   On: 04/30/2019 16:47   CT Angio Neck W and/or Wo Contrast  Result Date: 04/30/2019 CLINICAL DATA:  Acute neck pain. Headache and blurred vision. Question TIA. EXAM: CT ANGIOGRAPHY HEAD AND NECK TECHNIQUE: Multidetector CT imaging of the head and neck was performed using the standard protocol during bolus administration of intravenous contrast. Multiplanar CT image reconstructions and MIPs were obtained to evaluate the vascular anatomy. Carotid stenosis measurements (when applicable) are obtained utilizing NASCET criteria, using the distal internal carotid diameter as the denominator. CONTRAST:  79mL OMNIPAQUE IOHEXOL 350 MG/ML SOLN COMPARISON:  Head CT 06/18/2018.  CT angiography 06/01/2016. FINDINGS: CT HEAD FINDINGS Brain: Age related atrophy. Chronic small-vessel ischemic changes of the cerebral hemispheric white matter. No sign of acute infarction, mass lesion, hemorrhage, hydrocephalus or extra-axial collection. Vascular: There is atherosclerotic calcification of the major vessels at the base of the brain. Skull: Negative Sinuses: Clear Orbits: Normal Review of the MIP images confirms the above findings CTA NECK FINDINGS Aortic arch: Aortic atherosclerosis. No aneurysm or dissection. Branching pattern is normal. No flow limiting origin stenosis. 30% stenosis of the left subclavian origin. Right carotid system: Common carotid artery widely patent to the bifurcation. Few scattered foci of  plaque but no stenosis. Carotid bifurcation region is widely patent, previous carotid endarterectomy. No stenosis or irregularity. Cervical ICA widely patent. Left carotid system: Common carotid artery shows scattered plaque but no stenosis and is widely patent to the bifurcation. Extensive calcified plaque at the carotid bifurcation and ICA bulb. Minimal diameter in the ICA bulb is 3.5 mm. Compared to a more distal cervical ICA diameter of 4.5 mm, this indicates a 15-20% stenosis. Vertebral arteries: Atherosclerotic plaque at both vertebral artery origins but no stenosis greater than 30%. Both vertebral arteries widely patent through the cervical region to the foramen magnum. Bilateral subclavian artery stenosis beyond the vertebral arteries. 50-70% on the left and 30-50% on the right. Skeleton: Degenerative cervical spondylosis. Advanced arthropathy at the C1-2 articulation with prominent pannus. This encroaches upon the spinal canal and foramen magnum and appears to have some compressive effect in the region of the cervicomedullary junction. Other neck: No soft tissue mass or lymphadenopathy. Upper chest: Mild scarring at the lung apices. Review of the MIP images confirms the above findings CTA HEAD FINDINGS Anterior circulation: Both internal carotid arteries are patent through the skull base and siphon regions. There is siphon atherosclerotic calcification but no stenosis greater than 30%. The anterior and middle cerebral vessels are patent. No large or medium vessel occlusion. No correctable proximal stenosis. No aneurysm. Posterior circulation: Both vertebral arteries are patent through the foramen magnum to the basilar. Atherosclerotic change in both vertebral artery V4 segments with stenosis on the order of 30-50% on each side. No basilar stenosis. Posterior circulation branch vessels are patent. Venous sinuses: Patent and normal. Anatomic variants: None significant. Since the study of 2018, stenotic  disease of the left proximal ICA is slightly worsened. Review of the MIP images confirms the above findings IMPRESSION: No acute large or medium vessel occlusion. Aortic atherosclerosis without aneurysm or dissection. Right carotid bifurcation widely patent status post previous carotid endarterectomy. Atherosclerotic disease at the left carotid bifurcation and ICA bulb. Maximal stenosis of the ICA bulb is 20%. Plaque at both vertebral artery origins with narrowing estimated at 30%. Calcified plaque in both carotid siphons but without stenosis greater than 30%. 30-50% atherosclerotic narrowing of both vertebral artery V4 segments.  Both vessels do supply the basilar. Advanced arthropathy at the C1-2 articulation with prominent pannus, resulting in stenosis at the C1 level and foramen magnum and apparent compressive effect upon the cervicomedullary junction. This may have worsened slightly since the previous exam. Electronically Signed   By: Nelson Chimes M.D.   On: 04/30/2019 16:47    Assessment & Plan:   There are no diagnoses linked to this encounter.   No orders of the defined types were placed in this encounter.    Follow-up: No follow-ups on file.  [name], MD

## 2019-06-14 NOTE — Assessment & Plan Note (Signed)
Walker

## 2019-07-18 DIAGNOSIS — H5213 Myopia, bilateral: Secondary | ICD-10-CM | POA: Diagnosis not present

## 2019-07-18 DIAGNOSIS — E119 Type 2 diabetes mellitus without complications: Secondary | ICD-10-CM | POA: Diagnosis not present

## 2019-07-18 DIAGNOSIS — H5310 Unspecified subjective visual disturbances: Secondary | ICD-10-CM | POA: Diagnosis not present

## 2019-07-18 DIAGNOSIS — H26492 Other secondary cataract, left eye: Secondary | ICD-10-CM | POA: Diagnosis not present

## 2019-07-18 LAB — HM DIABETES EYE EXAM

## 2019-07-19 ENCOUNTER — Ambulatory Visit (INDEPENDENT_AMBULATORY_CARE_PROVIDER_SITE_OTHER): Payer: MEDICARE | Admitting: *Deleted

## 2019-07-19 DIAGNOSIS — I495 Sick sinus syndrome: Secondary | ICD-10-CM | POA: Diagnosis not present

## 2019-07-20 LAB — CUP PACEART REMOTE DEVICE CHECK
Battery Impedance: 1319 Ohm
Battery Remaining Longevity: 50 mo
Battery Voltage: 2.76 V
Brady Statistic AP VP Percent: 4 %
Brady Statistic AP VS Percent: 93 %
Brady Statistic AS VP Percent: 1 %
Brady Statistic AS VS Percent: 3 %
Date Time Interrogation Session: 20210406194800
Implantable Lead Implant Date: 20111031
Implantable Lead Implant Date: 20111031
Implantable Lead Location: 753859
Implantable Lead Location: 753860
Implantable Lead Model: 4469
Implantable Lead Model: 4470
Implantable Lead Serial Number: 548557
Implantable Lead Serial Number: 686076
Implantable Pulse Generator Implant Date: 20111031
Lead Channel Impedance Value: 473 Ohm
Lead Channel Impedance Value: 490 Ohm
Lead Channel Pacing Threshold Amplitude: 0.625 V
Lead Channel Pacing Threshold Amplitude: 0.75 V
Lead Channel Pacing Threshold Pulse Width: 0.4 ms
Lead Channel Pacing Threshold Pulse Width: 0.4 ms
Lead Channel Setting Pacing Amplitude: 2 V
Lead Channel Setting Pacing Amplitude: 2.5 V
Lead Channel Setting Pacing Pulse Width: 0.4 ms
Lead Channel Setting Sensing Sensitivity: 5.6 mV

## 2019-07-21 NOTE — Progress Notes (Signed)
PPM Remote  

## 2019-07-27 ENCOUNTER — Telehealth: Payer: Self-pay | Admitting: Nurse Practitioner

## 2019-07-27 NOTE — Telephone Encounter (Signed)
I spoke to the patient's daughter who called because of the patient's immobility issues and being [facility], would like her brother to accompany the patient to her visit with Amber.  I told her that I would put it in the appointment notes.

## 2019-07-27 NOTE — Progress Notes (Signed)
Electrophysiology Office Note Date: 07/29/2019  ID:  UMIKA BRENA, DOB 1922-10-22, MRN [id number]  PCP: [name], MD Electrophysiologist: [name]  CC: Pacemaker follow-up  Kayla Davis is a 86 y.o. female seen today for Dr [name].  She presents today for routine electrophysiology followup.  Since last being seen in our clinic, the patient reports doing very well. She has gotten both COVID vaccines. She is walking daily up and down her ramp at home up to 1/2 mile. She has chronic stable shortness of breath.  She denies chest pain, palpitations, PND, orthopnea, nausea, vomiting, dizziness, syncope, edema, weight gain, or early satiety.  Device History: MDT dual chamber PPM implanted 2011 for sinus node dysfunction    Past Medical History:  Diagnosis Date  . Anemia, unspecified   . Anxiety state, unspecified   . Aortic valve disorders   . Bradycardia    s/p PPM  . Bursitis   . Complication of anesthesia    has had difficulty awakening in past; pt states did well with the anesthesia given during her carotid surgery   . Diabetes mellitus without complication ([place])   . Diverticulosis of colon (without mention of hemorrhage)   . Herpes zoster with other nervous system complications(053.19)   . Hyperlipidemia   . Hypertension   . Hypothyroidism   . Irritable bowel syndrome   . Osteoarthrosis, unspecified whether generalized or localized, unspecified site   . Peripheral vascular disease ([place])   . Squamous cell carcinoma of skin 04/21/2012   left sideburn - tx p bx  . Squamous cell carcinoma of skin 01/10/2013   recurrent on left sideburn - tx p bx  . Squamous cell carcinoma of skin 08/22/2013   left sideburn - tx p bx  . TIA (transient ischemic attack)   . Unspecified hearing loss   . Varicose veins    Past Surgical History:  Procedure Laterality Date  . APPENDECTOMY    . CATARACT EXTRACTION, BILATERAL  end of June 2013  . CHOLECYSTECTOMY    . DILATION  AND CURETTAGE OF UTERUS    . ENDARTERECTOMY Right 01/20/2014   Procedure: RIGHT CAROTID ARTERY ENDARTERECTOMY WITH DACRON PATCH ANGIOPLASTY;  Surgeon: Mal Misty, MD;  Location: [facility];  Service: Vascular;  Laterality: Right;  . PACEMAKER INSERTION     MDT dual chamber PPM implanted by Dr [name] for sinus node dysfunction   . TONSILLECTOMY    . TOTAL HIP ARTHROPLASTY Left 03/23/2015   Procedure: TOTAL HIP ARTHROPLASTY ANTERIOR APPROACH;  Surgeon: Gaynelle Arabian, MD;  Location: [facility];  Service: Orthopedics;  Laterality: Left;  [name] VAGINAL HYSTERECTOMY      Current Outpatient Medications  Medication Sig Dispense Refill  . ALPHA LIPOIC ACID PO Take 1 tablet by mouth daily.    [name] aspirin EC 81 MG tablet Take 1 tablet (81 mg total) by mouth daily. 90 tablet 3  . CRANBERRY PO Take 3 tablets by mouth daily with supper.     . Cyanocobalamin (VITAMIN B-12 PO) Take 1 tablet by mouth at bedtime.    [name] Calcium (STOOL SOFTENER PO) Take 2 capsules by mouth at bedtime.    [name] EVENING PRIMROSE OIL PO Take 1 capsule by mouth daily.    [name] glucose blood (ONE TOUCH ULTRA TEST) test strip TEST ONCE DAILY AS DIRECTED DX E11.9 100 each 5  . hydrochlorothiazide (MICROZIDE) 12.5 MG capsule Take 1 capsule (12.5 mg total) by mouth daily. (Patient taking differently: Take 12.5 mg  by mouth 2 (two) times daily. ) 90 capsule 0  . ipratropium (ATROVENT) 0.06 % nasal spray Place 2 sprays into both nostrils 3 (three) times daily. (Patient taking differently: Place 2 sprays into both nostrils in the morning and at bedtime. ) 45 mL 3  . Lancets (ONETOUCH ULTRASOFT) lancets Use to check blood sugars once a day Dx E11.9 100 each 3  . levothyroxine (SYNTHROID) 100 MCG tablet Take 1 tablet (100 mcg total) by mouth daily before breakfast. Annual appt due in June w/labs must see provider for future refills 90 tablet 0  . losartan (COZAAR) 50 MG tablet Take 1 tablet (50 mg total) by mouth daily. 90 tablet 3  . metoprolol tartrate  (LOPRESSOR) 25 MG tablet Take 1 tablet (25 mg total) by mouth 2 (two) times daily. 180 tablet 1  . Multiple Vitamins-Minerals (EYE VITAMINS) CAPS Take 1 capsule by mouth daily.    [name] (SYSTANE OP) Apply to eye daily.    . polyethylene glycol (MIRALAX / GLYCOLAX) packet Take 17 g by mouth daily as needed for mild constipation.    . pregabalin (LYRICA) 200 MG capsule Take 1 capsule (200 mg total) by mouth at bedtime. 90 capsule 1  . simvastatin (ZOCOR) 40 MG tablet TAKE 1 TABLET DAILY AT 6PM 90 tablet 3   No current facility-administered medications for this visit.    Allergies:   Penicillins, Alprazolam, Bactrim [sulfamethoxazole-trimethoprim], Ceclor [cefaclor], Cephalexin, Ciprofloxacin, Clindamycin/lincomycin, Hydrocodone, Noroxin [norfloxacin], Sulfonamide derivatives, Tetracyclines & related, and Tramadol   Social History: Social History   Socioeconomic History  . Marital status: Widowed    Spouse name: Gypsy Balsam.  (deceased)  . Number of children: 4  . Years of education: COLLEGE  . Highest education level: Not on file  Occupational History  . Occupation: Retired    Fish farm manager: RETIRED  Tobacco Use  . Smoking status: Never Smoker  . Smokeless tobacco: Never Used  Substance and Sexual Activity  . Alcohol use: No    Alcohol/week: 0.0 standard drinks  . Drug use: No  . Sexual activity: Not Currently  Other Topics Concern  . Not on file  Social History Narrative   Patient is widowed with 4 children. Son stays with patient at night, 08/11/16.   Patient is right handed.   Patient has college education.   Patient drinks 2 cups daily.   Social Determinants of Health   Financial Resource Strain:   . Difficulty of Paying Living Expenses:   Food Insecurity:   . Worried About Charity fundraiser in the Last Year:   . Arboriculturist in the Last Year:   Transportation Needs:   . Film/video editor (Medical):   [name] Lack of Transportation  (Non-Medical):   Physical Activity: Insufficiently Active  . Days of Exercise per Week: 5 days  . Minutes of Exercise per Session: 20 min  Stress:   . Feeling of Stress :   Social Connections:   . Frequency of Communication with Friends and Family:   . Frequency of Social Gatherings with Friends and Family:   . Attends Religious Services:   . Active Member of Clubs or Organizations:   . Attends Archivist Meetings:   [name] Marital Status:   Intimate Partner Violence:   . Fear of Current or Ex-Partner:   . Emotionally Abused:   [name] Physically Abused:   . Sexually Abused:     Family History: Family History  Problem Relation Age of  Onset  . Heart disease Mother   . Heart disease Father   . Diabetes Father   . Cancer Paternal Grandmother      Review of Systems: All other systems reviewed and are otherwise negative except as noted above.   Physical Exam: VS:  BP (!) 114/56   Pulse (!) 59   Ht 5\' 5"  (1.651 m)   Wt 163 lb (73.9 kg)   LMP  (LMP Unknown)   SpO2 100%   BMI 27.12 kg/m  , BMI Body mass index is 27.12 kg/m.  GEN- The patient is elderly appearing, alert and oriented x 3 today.   HEENT: normocephalic, atraumatic; sclera clear, conjunctiva pink; hearing intact; oropharynx clear; neck supple, +HOH Lungs- Clear to ausculation bilaterally, normal work of breathing.  No wheezes, rales, rhonchi Heart- Regular rate and rhythm, no murmurs, rubs or gallops  GI- soft, non-tender, non-distended, bowel sounds present  Extremities- no clubbing, cyanosis, or edema  MS- no significant deformity or atrophy Skin- warm and dry, no rash or lesion; PPM pocket well healed Psych- euthymic mood, full affect Neuro- strength and sensation are intact  PPM Interrogation- reviewed in detail today,  See PACEART report  EKG:  EKG is not ordered today.  Recent Labs: 04/30/2019: ALT 18; Hemoglobin 12.2; Platelets 196 06/14/2019: BUN 25; Creatinine, Ser 1.01; Potassium 4.7; Sodium 132    Wt Readings from Last 3 Encounters:  07/29/19 163 lb (73.9 kg)  06/14/19 161 lb (73 kg)  01/28/19 150 lb (68 kg)      Assessment and Plan:  1.  Sinus node dysfunction Normal PPM function See Pace Art report No changes today  2.  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  HTN Stable No change required today  4.  Shortness of breath Chronic for patient and stable     Current medicines are reviewed at length with the patient today.   The patient does not have concerns regarding her medicines.  The following changes were made today:  none  Labs/ tests ordered today include: none No orders of the defined types were placed in this encounter.    Disposition:   Follow up with [facility], me in 1 year      Signed, Chanetta Marshall, NP 07/29/2019 12:05 PM  [place] [address] [place] [phone] (office) [phone] (fax)

## 2019-07-27 NOTE — Telephone Encounter (Signed)
Kayla Davis is calling requesting [name] attend the patient's upcoming appointment scheduled for 07/29/19 due to her having limited mobility and not hearing well. Please advise.

## 2019-07-28 ENCOUNTER — Encounter: Payer: MEDICARE | Admitting: Nurse Practitioner

## 2019-07-29 ENCOUNTER — Encounter: Payer: Self-pay | Admitting: Nurse Practitioner

## 2019-07-29 ENCOUNTER — Ambulatory Visit (INDEPENDENT_AMBULATORY_CARE_PROVIDER_SITE_OTHER): Payer: MEDICARE | Admitting: Nurse Practitioner

## 2019-07-29 VITALS — BP 114/56 | HR 59 | Ht 65.0 in | Wt 163.0 lb

## 2019-07-29 DIAGNOSIS — I495 Sick sinus syndrome: Secondary | ICD-10-CM | POA: Diagnosis not present

## 2019-07-29 DIAGNOSIS — R0602 Shortness of breath: Secondary | ICD-10-CM

## 2019-07-29 DIAGNOSIS — I10 Essential (primary) hypertension: Secondary | ICD-10-CM

## 2019-07-29 DIAGNOSIS — I471 Supraventricular tachycardia: Secondary | ICD-10-CM

## 2019-07-29 LAB — CUP PACEART INCLINIC DEVICE CHECK
Battery Impedance: 1291 Ohm
Battery Remaining Longevity: 51 mo
Battery Voltage: 2.76 V
Brady Statistic AP VP Percent: 4 %
Brady Statistic AP VS Percent: 93 %
Brady Statistic AS VP Percent: 1 %
Brady Statistic AS VS Percent: 3 %
Date Time Interrogation Session: 20210416120724
Implantable Lead Implant Date: 20111031
Implantable Lead Implant Date: 20111031
Implantable Lead Location: 753859
Implantable Lead Location: 753860
Implantable Lead Model: 4469
Implantable Lead Model: 4470
Implantable Lead Serial Number: 548557
Implantable Lead Serial Number: 686076
Implantable Pulse Generator Implant Date: 20111031
Lead Channel Impedance Value: 480 Ohm
Lead Channel Impedance Value: 524 Ohm
Lead Channel Pacing Threshold Amplitude: 0.5 V
Lead Channel Pacing Threshold Amplitude: 0.75 V
Lead Channel Pacing Threshold Pulse Width: 0.4 ms
Lead Channel Pacing Threshold Pulse Width: 0.4 ms
Lead Channel Setting Pacing Amplitude: 2 V
Lead Channel Setting Pacing Amplitude: 2.5 V
Lead Channel Setting Pacing Pulse Width: 0.4 ms
Lead Channel Setting Sensing Sensitivity: 5.6 mV

## 2019-07-29 MED ORDER — LEVOTHYROXINE SODIUM 100 MCG PO TABS: 100.0000 ug | ORAL_TABLET | Freq: Every day | ORAL | 0 refills | Status: DC

## 2019-07-29 NOTE — Addendum Note (Signed)
Addended by: Karren Cobble on: 07/29/2019 09:39 AM   Modules accepted: Orders

## 2019-07-29 NOTE — Patient Instructions (Addendum)
Medication Instructions:  none *If you need a refill on your cardiac medications before your next appointment, please call your pharmacy*   Lab Work: none If you have labs (blood work) drawn today and your tests are completely normal, you will receive your results only by: [name] MyChart Message (if you have MyChart) OR . A paper copy in the mail If you have any lab test that is abnormal or we need to change your treatment, we will call you to review the results.   Testing/Procedures: none   Follow-Up: At [facility], you and your health needs are our priority.  As part of our continuing mission to provide you with exceptional heart care, we have created designated Provider Care Teams.  These Care Teams include your primary Cardiologist (physician) and Advanced Practice Providers (APPs -  Physician Assistants and Nurse Practitioners) who all work together to provide you with the care you need, when you need it.   Your next appointment:   1 year(s)  The format for your next appointment:   In Person  Provider:   Chanetta Marshall, NP   Other Instructions Remote monitoring is used to monitor your Pacemaker from home. This monitoring reduces the number of office visits required to check your device to one time per year. It allows [place] to keep an eye on the functioning of your device to ensure it is working properly. You are scheduled for a device check from home on 10/18/19. You may send your transmission at any time that day. If you have a wireless device, the transmission will be sent automatically. After your physician reviews your transmission, you will receive a postcard with your next transmission date.

## 2019-07-29 NOTE — Addendum Note (Signed)
Addended by: Karren Cobble on: 07/29/2019 09:40 AM   Modules accepted: Orders

## 2019-08-02 ENCOUNTER — Other Ambulatory Visit: Payer: Self-pay | Admitting: Internal Medicine

## 2019-08-04 MED ORDER — HYDROCHLOROTHIAZIDE 12.5 MG PO CAPS: 12.5000 mg | ORAL_CAPSULE | Freq: Every day | ORAL | 3 refills | Status: DC

## 2019-08-04 NOTE — Addendum Note (Signed)
Addended by: Karren Cobble on: 08/04/2019 09:13 AM   Modules accepted: Orders

## 2019-08-12 ENCOUNTER — Encounter: Payer: Self-pay | Admitting: Internal Medicine

## 2019-09-14 ENCOUNTER — Encounter: Payer: Self-pay | Admitting: Internal Medicine

## 2019-09-14 ENCOUNTER — Other Ambulatory Visit: Payer: Self-pay | Admitting: Internal Medicine

## 2019-09-14 ENCOUNTER — Ambulatory Visit (INDEPENDENT_AMBULATORY_CARE_PROVIDER_SITE_OTHER): Payer: MEDICARE | Admitting: Internal Medicine

## 2019-09-14 ENCOUNTER — Other Ambulatory Visit: Payer: Self-pay

## 2019-09-14 VITALS — BP 130/70 | HR 76 | Temp 97.9°F | Resp 16 | Ht 65.0 in | Wt 162.0 lb

## 2019-09-14 DIAGNOSIS — N390 Urinary tract infection, site not specified: Secondary | ICD-10-CM | POA: Diagnosis not present

## 2019-09-14 LAB — POC URINALSYSI DIPSTICK (AUTOMATED)
Bilirubin, UA: NEGATIVE
Blood, UA: 1
Glucose, UA: NEGATIVE
Ketones, UA: NEGATIVE
Nitrite, UA: NEGATIVE
Protein, UA: NEGATIVE
Spec Grav, UA: 1.015 (ref 1.010–1.025)
Urobilinogen, UA: 0.2 E.U./dL
pH, UA: 7.5 (ref 5.0–8.0)

## 2019-09-14 MED ORDER — NITROFURANTOIN MONOHYD MACRO 100 MG PO CAPS: 100.0000 mg | ORAL_CAPSULE | Freq: Two times a day (BID) | ORAL | 0 refills | Status: DC

## 2019-09-14 NOTE — Patient Instructions (Signed)

## 2019-09-14 NOTE — Progress Notes (Signed)
Subjective:  Patient ID: Kayla Davis, female    DOB: April 18, 1922  Age: 86 y.o. MRN: [id number]  CC: Chills  This visit occurred during the SARS-CoV-2 public health emergency.  Safety protocols were in place, including screening questions prior to the visit, additional usage of staff PPE, and extensive cleaning of exam room while observing appropriate contact time as indicated for disinfecting solutions.    HPI Kayla Davis presents for the complaint of chills and dysuria for 2 days.  Outpatient Medications Prior to Visit  Medication Sig Dispense Refill  . ALPHA LIPOIC ACID PO Take 1 tablet by mouth daily.    [name] aspirin EC 81 MG tablet Take 1 tablet (81 mg total) by mouth daily. 90 tablet 3  . CRANBERRY PO Take 3 tablets by mouth daily with supper.     . Cyanocobalamin (VITAMIN B-12 PO) Take 1 tablet by mouth at bedtime.    [name] Calcium (STOOL SOFTENER PO) Take 2 capsules by mouth at bedtime.    [name] EVENING PRIMROSE OIL PO Take 1 capsule by mouth daily.    [name] glucose blood (ONE TOUCH ULTRA TEST) test strip TEST ONCE DAILY AS DIRECTED DX E11.9 100 each 5  . hydrochlorothiazide (MICROZIDE) 12.5 MG capsule Take 1 capsule (12.5 mg total) by mouth daily. 90 capsule 3  . ipratropium (ATROVENT) 0.06 % nasal spray Place 2 sprays into both nostrils 3 (three) times daily. (Patient taking differently: Place 2 sprays into both nostrils in the morning and at bedtime. ) 45 mL 3  . Lancets (ONETOUCH ULTRASOFT) lancets Use to check blood sugars once a day Dx E11.9 100 each 3  . levothyroxine (SYNTHROID) 100 MCG tablet Take 1 tablet (100 mcg total) by mouth daily before breakfast. Annual appt due in June w/labs must see provider for future refills 90 tablet 0  . losartan (COZAAR) 50 MG tablet Take 1 tablet (50 mg total) by mouth daily. 90 tablet 3  . metoprolol tartrate (LOPRESSOR) 25 MG tablet TAKE 1 TABLET BY MOUTH TWICE A DAY 180 tablet 3  . Multiple Vitamins-Minerals (EYE VITAMINS) CAPS Take  1 capsule by mouth daily.    [name] (SYSTANE OP) Apply to eye daily.    . polyethylene glycol (MIRALAX / GLYCOLAX) packet Take 17 g by mouth daily as needed for mild constipation.    . pregabalin (LYRICA) 200 MG capsule Take 1 capsule (200 mg total) by mouth at bedtime. 90 capsule 1  . simvastatin (ZOCOR) 40 MG tablet TAKE 1 TABLET DAILY AT 6PM 90 tablet 3   No facility-administered medications prior to visit.    ROS Review of Systems  Constitutional: Positive for chills. Negative for diaphoresis, fatigue and fever.  Eyes: Negative.   Respiratory: Negative for cough, chest tightness, shortness of breath and wheezing.   Cardiovascular: Negative for chest pain, palpitations and leg swelling.  Gastrointestinal: Negative for abdominal pain, diarrhea, nausea and vomiting.  Endocrine: Negative.   Genitourinary: Positive for dysuria. Negative for decreased urine volume, difficulty urinating, flank pain, hematuria and urgency.  Musculoskeletal: Negative.  Negative for arthralgias and myalgias.  Skin: Negative.   Neurological: Negative.  Negative for dizziness, weakness, light-headedness and headaches.  Hematological: Negative for adenopathy. Does not bruise/bleed easily.  Psychiatric/Behavioral: Negative.     Objective:  BP 130/70 (BP Location: Left Arm, Patient Position: Sitting, Cuff Size: Large)   Pulse 76   Temp 97.9 F (36.6 C) (Oral)   Resp 16   Ht 5\' 5"  (  1.651 m)   Wt 162 lb (73.5 kg)   LMP  (LMP Unknown)   SpO2 96%   BMI 26.96 kg/m   BP Readings from Last 3 Encounters:  09/14/19 130/70  07/29/19 (!) 114/56  06/14/19 (!) 142/76    Wt Readings from Last 3 Encounters:  09/14/19 162 lb (73.5 kg)  07/29/19 163 lb (73.9 kg)  06/14/19 161 lb (73 kg)    Physical Exam Vitals reviewed.  Constitutional:      General: She is not in acute distress.    Appearance: She is not ill-appearing, toxic-appearing or diaphoretic.  HENT:     Nose: Nose  normal.  Eyes:     General: No scleral icterus.    Conjunctiva/sclera: Conjunctivae normal.  Cardiovascular:     Rate and Rhythm: Normal rate and regular rhythm.     Heart sounds: Murmur present. Systolic murmur present with a grade of 2/6. No friction rub. No gallop.   Pulmonary:     Effort: Pulmonary effort is normal.     Breath sounds: No stridor. No wheezing, rhonchi or rales.  Abdominal:     General: Abdomen is flat.     Palpations: There is no mass.     Tenderness: There is no abdominal tenderness. There is no guarding.  Musculoskeletal:        General: Normal range of motion.     Cervical back: Neck supple.     Right lower leg: No edema.     Left lower leg: No edema.  Lymphadenopathy:     Cervical: No cervical adenopathy.  Skin:    General: Skin is warm and dry.  Neurological:     General: No focal deficit present.     Mental Status: She is alert.     Lab Results  Component Value Date   WBC 6.5 04/30/2019   HGB 12.2 04/30/2019   HCT 36.0 04/30/2019   PLT 196 04/30/2019   GLUCOSE 76 06/14/2019   CHOL 162 12/24/2016   TRIG 97.0 12/24/2016   HDL 80.70 12/24/2016   LDLDIRECT 143.0 10/07/2007   LDLCALC 62 12/24/2016   ALT 18 04/30/2019   AST 21 04/30/2019   NA 132 (L) 06/14/2019   K 4.7 06/14/2019   CL 95 (L) 06/14/2019   CREATININE 1.01 06/14/2019   BUN 25 (H) 06/14/2019   CO2 32 06/14/2019   TSH 10.14 (H) 04/15/2018   INR 1.1 04/30/2019   HGBA1C 6.5 04/15/2018    CT Angio Head W or Wo Contrast  Result Date: 04/30/2019 CLINICAL DATA:  Acute neck pain. Headache and blurred vision. Question TIA. EXAM: CT ANGIOGRAPHY HEAD AND NECK TECHNIQUE: Multidetector CT imaging of the head and neck was performed using the standard protocol during bolus administration of intravenous contrast. Multiplanar CT image reconstructions and MIPs were obtained to evaluate the vascular anatomy. Carotid stenosis measurements (when applicable) are obtained utilizing NASCET criteria,  using the distal internal carotid diameter as the denominator. CONTRAST:  55mL OMNIPAQUE IOHEXOL 350 MG/ML SOLN COMPARISON:  Head CT 06/18/2018.  CT angiography 06/01/2016. FINDINGS: CT HEAD FINDINGS Brain: Age related atrophy. Chronic small-vessel ischemic changes of the cerebral hemispheric white matter. No sign of acute infarction, mass lesion, hemorrhage, hydrocephalus or extra-axial collection. Vascular: There is atherosclerotic calcification of the major vessels at the base of the brain. Skull: Negative Sinuses: Clear Orbits: Normal Review of the MIP images confirms the above findings CTA NECK FINDINGS Aortic arch: Aortic atherosclerosis. No aneurysm or dissection. Branching pattern is normal. No  flow limiting origin stenosis. 30% stenosis of the left subclavian origin. Right carotid system: Common carotid artery widely patent to the bifurcation. Few scattered foci of plaque but no stenosis. Carotid bifurcation region is widely patent, previous carotid endarterectomy. No stenosis or irregularity. Cervical ICA widely patent. Left carotid system: Common carotid artery shows scattered plaque but no stenosis and is widely patent to the bifurcation. Extensive calcified plaque at the carotid bifurcation and ICA bulb. Minimal diameter in the ICA bulb is 3.5 mm. Compared to a more distal cervical ICA diameter of 4.5 mm, this indicates a 15-20% stenosis. Vertebral arteries: Atherosclerotic plaque at both vertebral artery origins but no stenosis greater than 30%. Both vertebral arteries widely patent through the cervical region to the foramen magnum. Bilateral subclavian artery stenosis beyond the vertebral arteries. 50-70% on the left and 30-50% on the right. Skeleton: Degenerative cervical spondylosis. Advanced arthropathy at the C1-2 articulation with prominent pannus. This encroaches upon the spinal canal and foramen magnum and appears to have some compressive effect in the region of the cervicomedullary junction.  Other neck: No soft tissue mass or lymphadenopathy. Upper chest: Mild scarring at the lung apices. Review of the MIP images confirms the above findings CTA HEAD FINDINGS Anterior circulation: Both internal carotid arteries are patent through the skull base and siphon regions. There is siphon atherosclerotic calcification but no stenosis greater than 30%. The anterior and middle cerebral vessels are patent. No large or medium vessel occlusion. No correctable proximal stenosis. No aneurysm. Posterior circulation: Both vertebral arteries are patent through the foramen magnum to the basilar. Atherosclerotic change in both vertebral artery V4 segments with stenosis on the order of 30-50% on each side. No basilar stenosis. Posterior circulation branch vessels are patent. Venous sinuses: Patent and normal. Anatomic variants: None significant. Since the study of 2018, stenotic disease of the left proximal ICA is slightly worsened. Review of the MIP images confirms the above findings IMPRESSION: No acute large or medium vessel occlusion. Aortic atherosclerosis without aneurysm or dissection. Right carotid bifurcation widely patent status post previous carotid endarterectomy. Atherosclerotic disease at the left carotid bifurcation and ICA bulb. Maximal stenosis of the ICA bulb is 20%. Plaque at both vertebral artery origins with narrowing estimated at 30%. Calcified plaque in both carotid siphons but without stenosis greater than 30%. 30-50% atherosclerotic narrowing of both vertebral artery V4 segments. Both vessels do supply the basilar. Advanced arthropathy at the C1-2 articulation with prominent pannus, resulting in stenosis at the C1 level and foramen magnum and apparent compressive effect upon the cervicomedullary junction. This may have worsened slightly since the previous exam. Electronically Signed   By: Nelson Chimes M.D.   On: 04/30/2019 16:47   CT Angio Neck W and/or Wo Contrast  Result Date:  04/30/2019 CLINICAL DATA:  Acute neck pain. Headache and blurred vision. Question TIA. EXAM: CT ANGIOGRAPHY HEAD AND NECK TECHNIQUE: Multidetector CT imaging of the head and neck was performed using the standard protocol during bolus administration of intravenous contrast. Multiplanar CT image reconstructions and MIPs were obtained to evaluate the vascular anatomy. Carotid stenosis measurements (when applicable) are obtained utilizing NASCET criteria, using the distal internal carotid diameter as the denominator. CONTRAST:  65mL OMNIPAQUE IOHEXOL 350 MG/ML SOLN COMPARISON:  Head CT 06/18/2018.  CT angiography 06/01/2016. FINDINGS: CT HEAD FINDINGS Brain: Age related atrophy. Chronic small-vessel ischemic changes of the cerebral hemispheric white matter. No sign of acute infarction, mass lesion, hemorrhage, hydrocephalus or extra-axial collection. Vascular: There is atherosclerotic calcification of the  major vessels at the base of the brain. Skull: Negative Sinuses: Clear Orbits: Normal Review of the MIP images confirms the above findings CTA NECK FINDINGS Aortic arch: Aortic atherosclerosis. No aneurysm or dissection. Branching pattern is normal. No flow limiting origin stenosis. 30% stenosis of the left subclavian origin. Right carotid system: Common carotid artery widely patent to the bifurcation. Few scattered foci of plaque but no stenosis. Carotid bifurcation region is widely patent, previous carotid endarterectomy. No stenosis or irregularity. Cervical ICA widely patent. Left carotid system: Common carotid artery shows scattered plaque but no stenosis and is widely patent to the bifurcation. Extensive calcified plaque at the carotid bifurcation and ICA bulb. Minimal diameter in the ICA bulb is 3.5 mm. Compared to a more distal cervical ICA diameter of 4.5 mm, this indicates a 15-20% stenosis. Vertebral arteries: Atherosclerotic plaque at both vertebral artery origins but no stenosis greater than 30%. Both  vertebral arteries widely patent through the cervical region to the foramen magnum. Bilateral subclavian artery stenosis beyond the vertebral arteries. 50-70% on the left and 30-50% on the right. Skeleton: Degenerative cervical spondylosis. Advanced arthropathy at the C1-2 articulation with prominent pannus. This encroaches upon the spinal canal and foramen magnum and appears to have some compressive effect in the region of the cervicomedullary junction. Other neck: No soft tissue mass or lymphadenopathy. Upper chest: Mild scarring at the lung apices. Review of the MIP images confirms the above findings CTA HEAD FINDINGS Anterior circulation: Both internal carotid arteries are patent through the skull base and siphon regions. There is siphon atherosclerotic calcification but no stenosis greater than 30%. The anterior and middle cerebral vessels are patent. No large or medium vessel occlusion. No correctable proximal stenosis. No aneurysm. Posterior circulation: Both vertebral arteries are patent through the foramen magnum to the basilar. Atherosclerotic change in both vertebral artery V4 segments with stenosis on the order of 30-50% on each side. No basilar stenosis. Posterior circulation branch vessels are patent. Venous sinuses: Patent and normal. Anatomic variants: None significant. Since the study of 2018, stenotic disease of the left proximal ICA is slightly worsened. Review of the MIP images confirms the above findings IMPRESSION: No acute large or medium vessel occlusion. Aortic atherosclerosis without aneurysm or dissection. Right carotid bifurcation widely patent status post previous carotid endarterectomy. Atherosclerotic disease at the left carotid bifurcation and ICA bulb. Maximal stenosis of the ICA bulb is 20%. Plaque at both vertebral artery origins with narrowing estimated at 30%. Calcified plaque in both carotid siphons but without stenosis greater than 30%. 30-50% atherosclerotic narrowing of both  vertebral artery V4 segments. Both vessels do supply the basilar. Advanced arthropathy at the C1-2 articulation with prominent pannus, resulting in stenosis at the C1 level and foramen magnum and apparent compressive effect upon the cervicomedullary junction. This may have worsened slightly since the previous exam. Electronically Signed   By: Nelson Chimes M.D.   On: 04/30/2019 16:47    Assessment & Plan:   Lakera was seen today for chills.  Diagnoses and all orders for this visit:  Urinary tract infection without hematuria, site unspecified- She has a 2-day history of chills and dysuria with pyuria.  Urine culture is pending.  Will empirically treat with a 5-day course of nitrofurantoin. -     CULTURE, URINE COMPREHENSIVE; Future -     POCT Urinalysis Dipstick (Automated) -     nitrofurantoin, macrocrystal-monohydrate, (MACROBID) 100 MG capsule; Take 1 capsule (100 mg total) by mouth 2 (two) times daily for 5  days. -     CULTURE, URINE COMPREHENSIVE   I am having Estill Bamberg D. Quentin Cornwall start on nitrofurantoin (macrocrystal-monohydrate). I am also having her maintain her ALPHA LIPOIC ACID PO, EVENING PRIMROSE OIL PO, Eye Vitamins, CRANBERRY PO, Docusate Calcium (STOOL SOFTENER PO), Cyanocobalamin (VITAMIN B-12 PO), Polyethyl [name] (SYSTANE OP), polyethylene glycol, glucose blood, onetouch ultrasoft, simvastatin, aspirin EC, losartan, ipratropium, pregabalin, levothyroxine, hydrochlorothiazide, and metoprolol tartrate.  Meds ordered this encounter  Medications  . nitrofurantoin, macrocrystal-monohydrate, (MACROBID) 100 MG capsule    Sig: Take 1 capsule (100 mg total) by mouth 2 (two) times daily for 5 days.    Dispense:  10 capsule    Refill:  0     Follow-up: Return in about 1 week (around 09/21/2019).  Scarlette Calico, MD

## 2019-09-15 LAB — CULTURE, URINE COMPREHENSIVE

## 2019-09-22 ENCOUNTER — Telehealth: Payer: Self-pay | Admitting: Internal Medicine

## 2019-09-22 DIAGNOSIS — N390 Urinary tract infection, site not specified: Secondary | ICD-10-CM

## 2019-09-22 MED ORDER — NITROFURANTOIN MONOHYD MACRO 100 MG PO CAPS: 100.0000 mg | ORAL_CAPSULE | Freq: Two times a day (BID) | ORAL | 0 refills | Status: AC

## 2019-09-22 NOTE — Telephone Encounter (Signed)
    Patients daughter calling, states UTI symptoms are back. Does she need another appointment or can additional medication be prescribed? Please advise

## 2019-09-22 NOTE — Telephone Encounter (Signed)
I refilled Macrobid.  Thanks

## 2019-09-22 NOTE — Telephone Encounter (Signed)
Pt's daughter informed of below.  

## 2019-09-29 ENCOUNTER — Other Ambulatory Visit: Payer: Self-pay

## 2019-09-29 ENCOUNTER — Encounter: Payer: Self-pay | Admitting: Internal Medicine

## 2019-09-29 ENCOUNTER — Ambulatory Visit (INDEPENDENT_AMBULATORY_CARE_PROVIDER_SITE_OTHER): Payer: MEDICARE | Admitting: Internal Medicine

## 2019-09-29 DIAGNOSIS — R269 Unspecified abnormalities of gait and mobility: Secondary | ICD-10-CM | POA: Diagnosis not present

## 2019-09-29 DIAGNOSIS — N390 Urinary tract infection, site not specified: Secondary | ICD-10-CM | POA: Diagnosis not present

## 2019-09-29 DIAGNOSIS — I10 Essential (primary) hypertension: Secondary | ICD-10-CM | POA: Diagnosis not present

## 2019-09-29 DIAGNOSIS — R42 Dizziness and giddiness: Secondary | ICD-10-CM

## 2019-09-29 LAB — BASIC METABOLIC PANEL
BUN: 23 mg/dL (ref 6–23)
CO2: 30 mEq/L (ref 19–32)
Calcium: 9.1 mg/dL (ref 8.4–10.5)
Chloride: 95 mEq/L — ABNORMAL LOW (ref 96–112)
Creatinine, Ser: 0.92 mg/dL (ref 0.40–1.20)
GFR: 56.75 mL/min — ABNORMAL LOW (ref >60.00)
Glucose, Bld: 135 mg/dL — ABNORMAL HIGH (ref 70–99)
Potassium: 4.1 mEq/L (ref 3.5–5.1)
Sodium: 129 mEq/L — ABNORMAL LOW (ref 135–145)

## 2019-09-29 LAB — URINALYSIS, ROUTINE W REFLEX MICROSCOPIC
Bilirubin Urine: NEGATIVE
Ketones, ur: NEGATIVE
Nitrite: POSITIVE — AB
Specific Gravity, Urine: 1.01 (ref 1.000–1.030)
Total Protein, Urine: NEGATIVE
Urine Glucose: NEGATIVE
Urobilinogen, UA: 0.2 (ref 0.0–1.0)
pH: 5.5 (ref 5.0–8.0)

## 2019-09-29 MED ORDER — MECLIZINE HCL 12.5 MG PO TABS
12.5000 mg | ORAL_TABLET | Freq: Three times a day (TID) | ORAL | 0 refills | Status: DC | PRN
Start: 2019-09-29 — End: 2020-02-16

## 2019-09-29 NOTE — Patient Instructions (Signed)

## 2019-09-29 NOTE — Progress Notes (Signed)
Subjective:  Patient ID: Kayla Davis, female    DOB: 1922-09-26  Age: 86 y.o. MRN: [id number]  CC: No chief complaint on file.   HPI [name] presents for UTI f/u C/o dizzy spells x 1 week; when she gets up, lasts x min to hrs Pt goes to bed at 4 am, gets up at noon.    Outpatient Medications Prior to Visit  Medication Sig Dispense Refill  . ALPHA LIPOIC ACID PO Take 1 tablet by mouth daily.    [name] aspirin EC 81 MG tablet Take 1 tablet (81 mg total) by mouth daily. 90 tablet 3  . CRANBERRY PO Take 3 tablets by mouth daily with supper.     . Cyanocobalamin (VITAMIN B-12 PO) Take 1 tablet by mouth at bedtime.    [name] Calcium (STOOL SOFTENER PO) Take 2 capsules by mouth at bedtime.    [name] EVENING PRIMROSE OIL PO Take 1 capsule by mouth daily.    [name] glucose blood (ONE TOUCH ULTRA TEST) test strip TEST ONCE DAILY AS DIRECTED DX E11.9 100 each 5  . hydrochlorothiazide (MICROZIDE) 12.5 MG capsule Take 1 capsule (12.5 mg total) by mouth daily. 90 capsule 3  . ipratropium (ATROVENT) 0.06 % nasal spray Place 2 sprays into both nostrils 3 (three) times daily. (Patient taking differently: Place 2 sprays into both nostrils in the morning and at bedtime. ) 45 mL 3  . Lancets (ONETOUCH ULTRASOFT) lancets Use to check blood sugars once a day Dx E11.9 100 each 3  . levothyroxine (SYNTHROID) 100 MCG tablet Take 1 tablet (100 mcg total) by mouth daily before breakfast. Annual appt due in June w/labs must see provider for future refills 90 tablet 0  . losartan (COZAAR) 50 MG tablet Take 1 tablet (50 mg total) by mouth daily. 90 tablet 3  . metoprolol tartrate (LOPRESSOR) 25 MG tablet TAKE 1 TABLET BY MOUTH TWICE A DAY 180 tablet 3  . Multiple Vitamins-Minerals (EYE VITAMINS) CAPS Take 1 capsule by mouth daily.    [name] (SYSTANE OP) Apply to eye daily.    . polyethylene glycol (MIRALAX / GLYCOLAX) packet Take 17 g by mouth daily as needed for mild constipation.      . pregabalin (LYRICA) 200 MG capsule Take 1 capsule (200 mg total) by mouth at bedtime. 90 capsule 1  . simvastatin (ZOCOR) 40 MG tablet TAKE 1 TABLET DAILY AT 6PM 90 tablet 3   No facility-administered medications prior to visit.    ROS: Review of Systems  Objective:  BP 126/62 (BP Location: Left Arm, Patient Position: Sitting, Cuff Size: Normal)   Pulse 96   Temp 98 F (36.7 C) (Oral)   Ht 5\' 5"  (1.651 m)   Wt 162 lb (73.5 kg)   LMP  (LMP Unknown)   SpO2 97%   BMI 26.96 kg/m   BP Readings from Last 3 Encounters:  09/29/19 126/62  09/14/19 130/70  07/29/19 (!) 114/56    Wt Readings from Last 3 Encounters:  09/29/19 162 lb (73.5 kg)  09/14/19 162 lb (73.5 kg)  07/29/19 163 lb (73.9 kg)    Physical Exam  Lab Results  Component Value Date   WBC 6.5 04/30/2019   HGB 12.2 04/30/2019   HCT 36.0 04/30/2019   PLT 196 04/30/2019   GLUCOSE 76 06/14/2019   CHOL 162 12/24/2016   TRIG 97.0 12/24/2016   HDL 80.70 12/24/2016   LDLDIRECT 143.0 10/07/2007   LDLCALC 62 12/24/2016  ALT 18 04/30/2019   AST 21 04/30/2019   NA 132 (L) 06/14/2019   K 4.7 06/14/2019   CL 95 (L) 06/14/2019   CREATININE 1.01 06/14/2019   BUN 25 (H) 06/14/2019   CO2 32 06/14/2019   TSH 10.14 (H) 04/15/2018   INR 1.1 04/30/2019   HGBA1C 6.5 04/15/2018    CT Angio Head W or Wo Contrast  Result Date: 04/30/2019 CLINICAL DATA:  Acute neck pain. Headache and blurred vision. Question TIA. EXAM: CT ANGIOGRAPHY HEAD AND NECK TECHNIQUE: Multidetector CT imaging of the head and neck was performed using the standard protocol during bolus administration of intravenous contrast. Multiplanar CT image reconstructions and MIPs were obtained to evaluate the vascular anatomy. Carotid stenosis measurements (when applicable) are obtained utilizing NASCET criteria, using the distal internal carotid diameter as the denominator. CONTRAST:  49mL OMNIPAQUE IOHEXOL 350 MG/ML SOLN COMPARISON:  Head CT 06/18/2018.  CT  angiography 06/01/2016. FINDINGS: CT HEAD FINDINGS Brain: Age related atrophy. Chronic small-vessel ischemic changes of the cerebral hemispheric white matter. No sign of acute infarction, mass lesion, hemorrhage, hydrocephalus or extra-axial collection. Vascular: There is atherosclerotic calcification of the major vessels at the base of the brain. Skull: Negative Sinuses: Clear Orbits: Normal Review of the MIP images confirms the above findings CTA NECK FINDINGS Aortic arch: Aortic atherosclerosis. No aneurysm or dissection. Branching pattern is normal. No flow limiting origin stenosis. 30% stenosis of the left subclavian origin. Right carotid system: Common carotid artery widely patent to the bifurcation. Few scattered foci of plaque but no stenosis. Carotid bifurcation region is widely patent, previous carotid endarterectomy. No stenosis or irregularity. Cervical ICA widely patent. Left carotid system: Common carotid artery shows scattered plaque but no stenosis and is widely patent to the bifurcation. Extensive calcified plaque at the carotid bifurcation and ICA bulb. Minimal diameter in the ICA bulb is 3.5 mm. Compared to a more distal cervical ICA diameter of 4.5 mm, this indicates a 15-20% stenosis. Vertebral arteries: Atherosclerotic plaque at both vertebral artery origins but no stenosis greater than 30%. Both vertebral arteries widely patent through the cervical region to the foramen magnum. Bilateral subclavian artery stenosis beyond the vertebral arteries. 50-70% on the left and 30-50% on the right. Skeleton: Degenerative cervical spondylosis. Advanced arthropathy at the C1-2 articulation with prominent pannus. This encroaches upon the spinal canal and foramen magnum and appears to have some compressive effect in the region of the cervicomedullary junction. Other neck: No soft tissue mass or lymphadenopathy. Upper chest: Mild scarring at the lung apices. Review of the MIP images confirms the above  findings CTA HEAD FINDINGS Anterior circulation: Both internal carotid arteries are patent through the skull base and siphon regions. There is siphon atherosclerotic calcification but no stenosis greater than 30%. The anterior and middle cerebral vessels are patent. No large or medium vessel occlusion. No correctable proximal stenosis. No aneurysm. Posterior circulation: Both vertebral arteries are patent through the foramen magnum to the basilar. Atherosclerotic change in both vertebral artery V4 segments with stenosis on the order of 30-50% on each side. No basilar stenosis. Posterior circulation branch vessels are patent. Venous sinuses: Patent and normal. Anatomic variants: None significant. Since the study of 2018, stenotic disease of the left proximal ICA is slightly worsened. Review of the MIP images confirms the above findings IMPRESSION: No acute large or medium vessel occlusion. Aortic atherosclerosis without aneurysm or dissection. Right carotid bifurcation widely patent status post previous carotid endarterectomy. Atherosclerotic disease at the left carotid bifurcation and ICA  bulb. Maximal stenosis of the ICA bulb is 20%. Plaque at both vertebral artery origins with narrowing estimated at 30%. Calcified plaque in both carotid siphons but without stenosis greater than 30%. 30-50% atherosclerotic narrowing of both vertebral artery V4 segments. Both vessels do supply the basilar. Advanced arthropathy at the C1-2 articulation with prominent pannus, resulting in stenosis at the C1 level and foramen magnum and apparent compressive effect upon the cervicomedullary junction. This may have worsened slightly since the previous exam. Electronically Signed   By: Nelson Chimes M.D.   On: 04/30/2019 16:47   CT Angio Neck W and/or Wo Contrast  Result Date: 04/30/2019 CLINICAL DATA:  Acute neck pain. Headache and blurred vision. Question TIA. EXAM: CT ANGIOGRAPHY HEAD AND NECK TECHNIQUE: Multidetector CT imaging of  the head and neck was performed using the standard protocol during bolus administration of intravenous contrast. Multiplanar CT image reconstructions and MIPs were obtained to evaluate the vascular anatomy. Carotid stenosis measurements (when applicable) are obtained utilizing NASCET criteria, using the distal internal carotid diameter as the denominator. CONTRAST:  21mL OMNIPAQUE IOHEXOL 350 MG/ML SOLN COMPARISON:  Head CT 06/18/2018.  CT angiography 06/01/2016. FINDINGS: CT HEAD FINDINGS Brain: Age related atrophy. Chronic small-vessel ischemic changes of the cerebral hemispheric white matter. No sign of acute infarction, mass lesion, hemorrhage, hydrocephalus or extra-axial collection. Vascular: There is atherosclerotic calcification of the major vessels at the base of the brain. Skull: Negative Sinuses: Clear Orbits: Normal Review of the MIP images confirms the above findings CTA NECK FINDINGS Aortic arch: Aortic atherosclerosis. No aneurysm or dissection. Branching pattern is normal. No flow limiting origin stenosis. 30% stenosis of the left subclavian origin. Right carotid system: Common carotid artery widely patent to the bifurcation. Few scattered foci of plaque but no stenosis. Carotid bifurcation region is widely patent, previous carotid endarterectomy. No stenosis or irregularity. Cervical ICA widely patent. Left carotid system: Common carotid artery shows scattered plaque but no stenosis and is widely patent to the bifurcation. Extensive calcified plaque at the carotid bifurcation and ICA bulb. Minimal diameter in the ICA bulb is 3.5 mm. Compared to a more distal cervical ICA diameter of 4.5 mm, this indicates a 15-20% stenosis. Vertebral arteries: Atherosclerotic plaque at both vertebral artery origins but no stenosis greater than 30%. Both vertebral arteries widely patent through the cervical region to the foramen magnum. Bilateral subclavian artery stenosis beyond the vertebral arteries. 50-70% on the  left and 30-50% on the right. Skeleton: Degenerative cervical spondylosis. Advanced arthropathy at the C1-2 articulation with prominent pannus. This encroaches upon the spinal canal and foramen magnum and appears to have some compressive effect in the region of the cervicomedullary junction. Other neck: No soft tissue mass or lymphadenopathy. Upper chest: Mild scarring at the lung apices. Review of the MIP images confirms the above findings CTA HEAD FINDINGS Anterior circulation: Both internal carotid arteries are patent through the skull base and siphon regions. There is siphon atherosclerotic calcification but no stenosis greater than 30%. The anterior and middle cerebral vessels are patent. No large or medium vessel occlusion. No correctable proximal stenosis. No aneurysm. Posterior circulation: Both vertebral arteries are patent through the foramen magnum to the basilar. Atherosclerotic change in both vertebral artery V4 segments with stenosis on the order of 30-50% on each side. No basilar stenosis. Posterior circulation branch vessels are patent. Venous sinuses: Patent and normal. Anatomic variants: None significant. Since the study of 2018, stenotic disease of the left proximal ICA is slightly worsened. Review of the  MIP images confirms the above findings IMPRESSION: No acute large or medium vessel occlusion. Aortic atherosclerosis without aneurysm or dissection. Right carotid bifurcation widely patent status post previous carotid endarterectomy. Atherosclerotic disease at the left carotid bifurcation and ICA bulb. Maximal stenosis of the ICA bulb is 20%. Plaque at both vertebral artery origins with narrowing estimated at 30%. Calcified plaque in both carotid siphons but without stenosis greater than 30%. 30-50% atherosclerotic narrowing of both vertebral artery V4 segments. Both vessels do supply the basilar. Advanced arthropathy at the C1-2 articulation with prominent pannus, resulting in stenosis at the C1  level and foramen magnum and apparent compressive effect upon the cervicomedullary junction. This may have worsened slightly since the previous exam. Electronically Signed   By: Nelson Chimes M.D.   On: 04/30/2019 16:47    Assessment & Plan:   There are no diagnoses linked to this encounter.   No orders of the defined types were placed in this encounter.    Follow-up: No follow-ups on file.  [name], MD

## 2019-09-29 NOTE — Assessment & Plan Note (Signed)
walker

## 2019-09-29 NOTE — Assessment & Plan Note (Signed)
Macrobid x 2 courses  UA

## 2019-09-29 NOTE — Assessment & Plan Note (Signed)
BP Readings from Last 3 Encounters:  09/29/19 126/62  09/14/19 130/70  07/29/19 (!) 114/56

## 2019-09-29 NOTE — Assessment & Plan Note (Signed)
Benign Positional Vertigo symptoms Start Meclizine. Start Brandt - Daroff exercise several times a day as dirrected.  

## 2019-10-18 ENCOUNTER — Other Ambulatory Visit: Payer: Self-pay | Admitting: Internal Medicine

## 2019-10-18 ENCOUNTER — Ambulatory Visit (INDEPENDENT_AMBULATORY_CARE_PROVIDER_SITE_OTHER): Payer: MEDICARE | Admitting: *Deleted

## 2019-10-18 DIAGNOSIS — R55 Syncope and collapse: Secondary | ICD-10-CM | POA: Diagnosis not present

## 2019-10-18 LAB — CUP PACEART REMOTE DEVICE CHECK
Battery Impedance: 1483 Ohm
Battery Remaining Longevity: 45 mo
Battery Voltage: 2.76 V
Brady Statistic AP VP Percent: 7 %
Brady Statistic AP VS Percent: 91 %
Brady Statistic AS VP Percent: 2 %
Brady Statistic AS VS Percent: 1 %
Date Time Interrogation Session: 20210706122421
Implantable Lead Implant Date: 20111031
Implantable Lead Implant Date: 20111031
Implantable Lead Location: 753859
Implantable Lead Location: 753860
Implantable Lead Model: 4469
Implantable Lead Model: 4470
Implantable Lead Serial Number: 548557
Implantable Lead Serial Number: 686076
Implantable Pulse Generator Implant Date: 20111031
Lead Channel Impedance Value: 481 Ohm
Lead Channel Impedance Value: 497 Ohm
Lead Channel Pacing Threshold Amplitude: 0.5 V
Lead Channel Pacing Threshold Amplitude: 0.75 V
Lead Channel Pacing Threshold Pulse Width: 0.4 ms
Lead Channel Pacing Threshold Pulse Width: 0.4 ms
Lead Channel Setting Pacing Amplitude: 2 V
Lead Channel Setting Pacing Amplitude: 2.5 V
Lead Channel Setting Pacing Pulse Width: 0.4 ms
Lead Channel Setting Sensing Sensitivity: 5.6 mV

## 2019-10-19 ENCOUNTER — Other Ambulatory Visit: Payer: Self-pay

## 2019-10-19 MED ORDER — SIMVASTATIN 40 MG PO TABS: ORAL_TABLET | ORAL | 3 refills | Status: DC

## 2019-10-20 NOTE — Progress Notes (Signed)
Remote pacemaker transmission.   

## 2019-10-22 ENCOUNTER — Emergency Department (HOSPITAL_BASED_OUTPATIENT_CLINIC_OR_DEPARTMENT_OTHER): Payer: MEDICARE

## 2019-10-22 ENCOUNTER — Emergency Department (HOSPITAL_BASED_OUTPATIENT_CLINIC_OR_DEPARTMENT_OTHER)
Admission: EM | Admit: 2019-10-22 | Discharge: 2019-10-22 | Disposition: A | Discharge status: Home / Self Care | Payer: MEDICARE | Attending: Emergency Medicine | Admitting: Emergency Medicine

## 2019-10-22 ENCOUNTER — Other Ambulatory Visit: Payer: Self-pay

## 2019-10-22 ENCOUNTER — Encounter (HOSPITAL_BASED_OUTPATIENT_CLINIC_OR_DEPARTMENT_OTHER): Payer: Self-pay | Admitting: Emergency Medicine

## 2019-10-22 DIAGNOSIS — W010XXA Fall on same level from slipping, tripping and stumbling without subsequent striking against object, initial encounter: Secondary | ICD-10-CM | POA: Insufficient documentation

## 2019-10-22 DIAGNOSIS — I10 Essential (primary) hypertension: Secondary | ICD-10-CM | POA: Diagnosis not present

## 2019-10-22 DIAGNOSIS — Y9389 Activity, other specified: Secondary | ICD-10-CM | POA: Diagnosis not present

## 2019-10-22 DIAGNOSIS — Z96642 Presence of left artificial hip joint: Secondary | ICD-10-CM | POA: Insufficient documentation

## 2019-10-22 DIAGNOSIS — K439 Ventral hernia without obstruction or gangrene: Secondary | ICD-10-CM | POA: Diagnosis not present

## 2019-10-22 DIAGNOSIS — I700 Atherosclerosis of aorta: Secondary | ICD-10-CM | POA: Diagnosis not present

## 2019-10-22 DIAGNOSIS — Y999 Unspecified external cause status: Secondary | ICD-10-CM | POA: Insufficient documentation

## 2019-10-22 DIAGNOSIS — E119 Type 2 diabetes mellitus without complications: Secondary | ICD-10-CM | POA: Insufficient documentation

## 2019-10-22 DIAGNOSIS — K429 Umbilical hernia without obstruction or gangrene: Secondary | ICD-10-CM | POA: Diagnosis not present

## 2019-10-22 DIAGNOSIS — R109 Unspecified abdominal pain: Secondary | ICD-10-CM | POA: Diagnosis not present

## 2019-10-22 DIAGNOSIS — K838 Other specified diseases of biliary tract: Secondary | ICD-10-CM | POA: Diagnosis not present

## 2019-10-22 DIAGNOSIS — E039 Hypothyroidism, unspecified: Secondary | ICD-10-CM | POA: Insufficient documentation

## 2019-10-22 DIAGNOSIS — R1011 Right upper quadrant pain: Secondary | ICD-10-CM | POA: Diagnosis not present

## 2019-10-22 DIAGNOSIS — W19XXXA Unspecified fall, initial encounter: Secondary | ICD-10-CM

## 2019-10-22 DIAGNOSIS — Y92009 Unspecified place in unspecified non-institutional (private) residence as the place of occurrence of the external cause: Secondary | ICD-10-CM | POA: Insufficient documentation

## 2019-10-22 LAB — URINALYSIS, ROUTINE W REFLEX MICROSCOPIC
Bilirubin Urine: NEGATIVE
Glucose, UA: NEGATIVE mg/dL
Ketones, ur: NEGATIVE mg/dL
Nitrite: POSITIVE — AB
Protein, ur: NEGATIVE mg/dL
Specific Gravity, Urine: 1.015 (ref 1.005–1.030)
pH: 7.5 (ref 5.0–8.0)

## 2019-10-22 LAB — COMPREHENSIVE METABOLIC PANEL
ALT: 11 U/L (ref 0–44)
AST: 25 U/L (ref 15–41)
Albumin: 3.6 g/dL (ref 3.5–5.0)
Alkaline Phosphatase: 66 U/L (ref 38–126)
Anion gap: 10 (ref 5–15)
BUN: 18 mg/dL (ref 8–23)
CO2: 24 mmol/L (ref 22–32)
Calcium: 8.8 mg/dL — ABNORMAL LOW (ref 8.9–10.3)
Chloride: 98 mmol/L (ref 98–111)
Creatinine, Ser: 0.88 mg/dL (ref 0.44–1.00)
GFR calc Af Amer: >60 mL/min (ref >60)
GFR calc non Af Amer: 56 mL/min — ABNORMAL LOW (ref >60)
Glucose, Bld: 103 mg/dL — ABNORMAL HIGH (ref 70–99)
Potassium: 4.4 mmol/L (ref 3.5–5.1)
Sodium: 132 mmol/L — ABNORMAL LOW (ref 135–145)
Total Bilirubin: 0.7 mg/dL (ref 0.3–1.2)
Total Protein: 6.2 g/dL — ABNORMAL LOW (ref 6.5–8.1)

## 2019-10-22 LAB — CBC WITH DIFFERENTIAL/PLATELET
Abs Immature Granulocytes: 0.03 10*3/uL (ref 0.00–0.07)
Basophils Absolute: 0.1 10*3/uL (ref 0.0–0.1)
Basophils Relative: 1 %
Eosinophils Absolute: 0.4 10*3/uL (ref 0.0–0.5)
Eosinophils Relative: 6 %
HCT: 36.2 % (ref 36.0–46.0)
Hemoglobin: 11.8 g/dL — ABNORMAL LOW (ref 12.0–15.0)
Immature Granulocytes: 1 %
Lymphocytes Relative: 26 %
Lymphs Abs: 1.7 10*3/uL (ref 0.7–4.0)
MCH: 31 pg (ref 26.0–34.0)
MCHC: 32.6 g/dL (ref 30.0–36.0)
MCV: 95 fL (ref 80.0–100.0)
Monocytes Absolute: 0.6 10*3/uL (ref 0.1–1.0)
Monocytes Relative: 9 %
Neutro Abs: 3.8 10*3/uL (ref 1.7–7.7)
Neutrophils Relative %: 57 %
Platelets: 174 10*3/uL (ref 150–400)
RBC: 3.81 MIL/uL — ABNORMAL LOW (ref 3.87–5.11)
RDW: 13 % (ref 11.5–15.5)
WBC: 6.5 10*3/uL (ref 4.0–10.5)
nRBC: 0 % (ref 0.0–0.2)

## 2019-10-22 LAB — URINALYSIS, MICROSCOPIC (REFLEX): WBC, UA: >50 WBC/hpf (ref 0–5)

## 2019-10-22 MED ORDER — IOHEXOL 300 MG/ML  SOLN
100.0000 mL | Freq: Once | INTRAMUSCULAR | Status: AC | PRN
  Administered 2019-10-22: 100 mL via INTRAVENOUS

## 2019-10-22 MED ORDER — NITROFURANTOIN MONOHYD MACRO 100 MG PO CAPS
100.0000 mg | ORAL_CAPSULE | Freq: Two times a day (BID) | ORAL | 0 refills | Status: AC
Start: 2019-10-22 — End: 2019-10-29

## 2019-10-22 NOTE — Discharge Instructions (Signed)
You were seen in the emergency department today after a fall with abdominal discomfort.  Your CT scan looks normal and your lab work is reassuring.  I do not see any broken ribs or internal injuries.  Your urine does show some signs of infection.  It is difficult to interpret as you are not experiencing any symptoms of a urinary tract infection.  I am writing an antibiotic prescription for you to have at home.  If you develop any fever, burning when you pee, urinating frequently, going urgently to the bathroom, or confusion you can start the antibiotic and follow closely with your primary care doctor.  If symptoms suddenly worsen please return to the emergency department for reevaluation

## 2019-10-22 NOTE — ED Notes (Signed)
Assisted to ambulate to the bathroom.  Steady gait noted with walker.  Tolerated well.

## 2019-10-22 NOTE — ED Triage Notes (Signed)
Tripped and fell yesterday. Reports pain to RUQ. Sent from [facility].

## 2019-10-22 NOTE — ED Provider Notes (Signed)
Emergency Department Provider Note   I have reviewed the triage vital signs and the nursing notes.   HISTORY  Chief Complaint Fall   HPI Kayla Davis is a 86 y.o. female with past medical history reviewed below presents to the emergency department after fall yesterday.  She has had some persistent right-sided abdominal pain worse in the right upper quadrant.  She states that last night she was getting ready for bed around 10 PM when she tripped over some shoes on the floor.  She landed against a chair that was nearby.  She did not hit her head or lose consciousness.  The chair struck her right side.  She lives with her son who was able to assist her.  When she woke up today she continued to have pain in the right abdomen/lower right chest and so presented to urgent care.  They evaluated the patient there and then recommended that she come to the emergency department for further imaging.  Patient does have history of frequent urinary tract infection.  She is not having dysuria, hesitancy, urgency.  No fevers or change in mental status.  Son states that patient was last treated with Macrobid 3 weeks ago for urinary tract infection. A UA was drawn at [facility] and patient told about a possible infection on that test.   Past Medical History:  Diagnosis Date  . Anemia, unspecified   . Anxiety state, unspecified   . Aortic valve disorders   . Bradycardia    s/p PPM  . Bursitis   . Complication of anesthesia    has had difficulty awakening in past; pt states did well with the anesthesia given during her carotid surgery   . Diabetes mellitus without complication ([place])   . Diverticulosis of colon (without mention of hemorrhage)   . Herpes zoster with other nervous system complications(053.19)   . Hyperlipidemia   . Hypertension   . Hypothyroidism   . Irritable bowel syndrome   . Osteoarthrosis, unspecified whether generalized or localized, unspecified site   . Peripheral vascular disease  ([place])   . Squamous cell carcinoma of skin 04/21/2012   left sideburn - tx p bx  . Squamous cell carcinoma of skin 01/10/2013   recurrent on left sideburn - tx p bx  . Squamous cell carcinoma of skin 08/22/2013   left sideburn - tx p bx  . TIA (transient ischemic attack)   . Unspecified hearing loss   . Varicose veins     Patient Active Problem List   Diagnosis Date Noted  . Gout attack 03/07/2019  . Otitis externa 03/07/2019  . Hematuria 11/20/2017  . Cough 11/20/2017  . Colon cancer screening 08/04/2017  . Abdominal pain 03/13/2017  . Amaurosis 07/03/2016  . Diet-controlled type 2 diabetes mellitus ([place]) 02/29/2016  . Vertigo 12/11/2015  . Gait disorder 06/27/2015  . OA (osteoarthritis) of hip 03/23/2015  . Preop exam for internal medicine 03/04/2015  . Shortness of breath 02/13/2015  . Rhinitis, atrophic 11/22/2014  . Well adult exam 04/21/2014  . Amaurosis fugax 03/31/2014  . History of CEA (carotid endarterectomy) 03/31/2014  . HLD (hyperlipidemia) 03/31/2014  . Carotid stenosis 02/07/2014  . Cerebral infarction ([place]) 01/16/2014  . Dyslipidemia 01/16/2014  . Weakness generalized 04/30/2013  . CAP (community acquired pneumonia) 04/29/2013  . Edema 03/22/2013  . Urinary incontinence in female 03/22/2013  . Constipation 10/01/2012  . UTI (urinary tract infection) 10/01/2012  . Diarrhea 08/26/2012  . Cerumen impaction 12/11/2011  . Pacemaker-Medtronic 07/30/2010  .  SINUS BRADYCARDIA 05/17/2010  . DYSURIA 01/29/2010  . Aortic valve disorders 07/31/2009  . Venous (peripheral) insufficiency 07/31/2009  . Hyponatremia 07/01/2009  . LEG CRAMPS 06/12/2009  . LEG PAIN, BILATERAL 02/01/2009  . Backache 08/24/2008  . Anemia 06/21/2008  . DEGENERATIVE JOINT DISEASE 02/19/2008  . CHEST WALL PAIN, HX OF 02/19/2008  . IRRITABLE BOWEL SYNDROME 12/28/2007  . DIABETES MELLITUS, BORDERLINE 11/06/2007  . GASTRITIS 07/08/2007  . POSTHERPETIC NEURALGIA 07/01/2007  . Herpes  zoster without mention of complication 63/14/9702  . Hypothyroidism 06/08/2007  . HYPERLIPIDEMIA 06/08/2007  . Anxiety state 06/08/2007  . Hereditary and idiopathic peripheral neuropathy 06/08/2007  . HEARING LOSS 06/08/2007  . Essential hypertension 06/08/2007  . Transient cerebral ischemia 06/08/2007  . BRONCHITIS, RECURRENT 06/08/2007  . DIVERTICULOSIS OF COLON 06/08/2007  . Osteoporosis 06/08/2007    Past Surgical History:  Procedure Laterality Date  . APPENDECTOMY    . CATARACT EXTRACTION, BILATERAL  end of June 2013  . CHOLECYSTECTOMY    . DILATION AND CURETTAGE OF UTERUS    . ENDARTERECTOMY Right 01/20/2014   Procedure: RIGHT CAROTID ARTERY ENDARTERECTOMY WITH DACRON PATCH ANGIOPLASTY;  Surgeon: Mal Misty, MD;  Location: [facility];  Service: Vascular;  Laterality: Right;  . PACEMAKER INSERTION     MDT dual chamber PPM implanted by Dr [name] for sinus node dysfunction   . TONSILLECTOMY    . TOTAL HIP ARTHROPLASTY Left 03/23/2015   Procedure: TOTAL HIP ARTHROPLASTY ANTERIOR APPROACH;  Surgeon: Gaynelle Arabian, MD;  Location: [facility];  Service: Orthopedics;  Laterality: Left;  [name] VAGINAL HYSTERECTOMY      Allergies Penicillins, Alprazolam, Bactrim [sulfamethoxazole-trimethoprim], Ceclor [cefaclor], Cephalexin, Ciprofloxacin, Clindamycin/lincomycin, Hydrocodone, Noroxin [norfloxacin], Sulfonamide derivatives, Tetracyclines & related, and Tramadol  Family History  Problem Relation Age of Onset  . Heart disease Mother   . Heart disease Father   . Diabetes Father   . Cancer Paternal Grandmother     Social History Social History   Tobacco Use  . Smoking status: Never Smoker  . Smokeless tobacco: Never Used  Vaping Use  . Vaping Use: Never used  Substance Use Topics  . Alcohol use: No    Alcohol/week: 0.0 standard drinks  . Drug use: No    Review of Systems  Constitutional: No fever/chills Cardiovascular: Denies chest pain. Respiratory: Denies shortness of  breath. Gastrointestinal: Positive RUQ abdominal pain.  Genitourinary: Negative for dysuria. Musculoskeletal: Negative for back pain. Skin: Negative for rash. Neurological: Negative for headaches, focal weakness or numbness.  10-point ROS otherwise negative.  ____________________________________________   PHYSICAL EXAM:  VITAL SIGNS: ED Triage Vitals  Enc Vitals Group     BP 10/22/19 1541 (!) 155/74     Pulse Rate 10/22/19 1541 65     Resp 10/22/19 1541 16     Temp 10/22/19 1541 97.8 F (36.6 C)     Temp Source 10/22/19 1541 Oral     SpO2 10/22/19 1541 95 %     Weight 10/22/19 1539 160 lb (72.6 kg)     Height 10/22/19 1539 5\' 5"  (1.651 m)   Constitutional: Alert and oriented. Well appearing and in no acute distress. Eyes: Conjunctivae are normal. PERRL. Head: Atraumatic. Nose: No congestion/rhinnorhea. Mouth/Throat: Mucous membranes are moist.  Neck: No stridor. No cervical spine tenderness to palpation. Cardiovascular: Normal rate, regular rhythm. Good peripheral circulation. Grossly normal heart sounds.   Respiratory: Normal respiratory effort.  No retractions. Lungs CTAB. Gastrointestinal: Soft with mild right sided abdominal tenderness. No rebound or guarding. No ecchymosis  over the flank. No distention.  Musculoskeletal: No gross deformities of extremities. Normal ROM of the bilateral upper and lower extremities. No focal bony tenderness.  Neurologic:  Normal speech and language. Skin:  Skin is warm, dry and intact. No rash noted.  ____________________________________________   LABS (all labs ordered are listed, but only abnormal results are displayed)  Labs Reviewed  COMPREHENSIVE METABOLIC PANEL - Abnormal; Notable for the following components:      Result Value   Sodium 132 (*)    Glucose, Bld 103 (*)    Calcium 8.8 (*)    Total Protein 6.2 (*)    GFR calc non Af Amer 56 (*)    All other components within normal limits  URINALYSIS, ROUTINE W REFLEX  MICROSCOPIC - Abnormal; Notable for the following components:   APPearance CLOUDY (*)    Hgb urine dipstick TRACE (*)    Nitrite POSITIVE (*)    Leukocytes,Ua LARGE (*)    All other components within normal limits  CBC WITH DIFFERENTIAL/PLATELET - Abnormal; Notable for the following components:   RBC 3.81 (*)    Hemoglobin 11.8 (*)    All other components within normal limits  URINALYSIS, MICROSCOPIC (REFLEX) - Abnormal; Notable for the following components:   Bacteria, UA MANY (*)    All other components within normal limits  URINE CULTURE  CBC WITH DIFFERENTIAL/PLATELET   _________________________________  RADIOLOGY  CT ABDOMEN PELVIS W CONTRAST  Result Date: 10/22/2019 CLINICAL DATA:  [place] yesterday. Increasing right upper quadrant abdominal pain. EXAM: CT ABDOMEN AND PELVIS WITH CONTRAST TECHNIQUE: Multidetector CT imaging of the abdomen and pelvis was performed using the standard protocol following bolus administration of intravenous contrast. CONTRAST:  140mL OMNIPAQUE IOHEXOL 300 MG/ML  SOLN COMPARISON:  CT scan 03/04/2017 FINDINGS: Lower chest: Basilar scarring changes and streaky atelectasis. No effusions or pulmonary lesions. No pneumothorax. The heart is mildly enlarged. Stable pacer wires and coronary artery and aortic calcifications. Mitral valve annular calcifications are also noted. Hepatobiliary: No acute hepatic injury or worrisome hepatic lesion. No perihepatic fluid collections. The gallbladder is surgically absent. Mild associated common bile duct dilatation. Pancreas: No mass, inflammation or ductal dilatation. Spleen: Normal size. No acute injury. No perisplenic fluid collection. Adrenals/Urinary Tract: The renal glands and kidneys are unremarkable. The bladder is normal. Stomach/Bowel: The stomach, duodenum, small bowel and colon are grossly normal without oral contrast. No acute inflammatory changes, mass lesions or obstructive findings. Moderate stool throughout the  colon may suggest constipation. Vascular/Lymphatic: Advanced atherosclerotic calcifications involving the aorta and branch vessels. No aortic aneurysm or dissection. Reproductive: Surgically absent. Other: No pelvic mass or adenopathy. No free pelvic fluid collections. No inguinal mass or adenopathy. Periumbilical abdominal wall hernia containing fat. Musculoskeletal: The bony pelvis is intact. A left hip prosthesis is noted. No hip or pelvic fractures are identified. Scoliosis and advanced degenerative cervical spondylosis with multilevel disc disease and facet disease. No definite acute lumbar fracture. Bilateral pars defects are noted at L5 with a grade 1 spondylolisthesis. IMPRESSION: 1. No acute abdominal/pelvic findings, mass lesions or adenopathy. 2. Status post cholecystectomy with mild associated common bile duct dilatation. 3. Advanced atherosclerotic calcifications involving the aorta and branch vessels. 4. Moderate stool throughout the colon may suggest constipation. 5. No acute bony findings. Electronically Signed   By: Marijo Sanes M.D.   On: 10/22/2019 17:55    ____________________________________________   PROCEDURES  Procedure(s) performed:   Procedures  None  ____________________________________________   INITIAL IMPRESSION / ASSESSMENT AND PLAN /  ED COURSE  Pertinent labs & imaging results that were available during my care of the patient were reviewed by me and considered in my medical decision making (see chart for details).   Patient presents to the emergency department for evaluation of mechanical fall yesterday with right-sided abdominal pain.  Sent from urgent care for advanced imaging.  This seems appropriate based on my exam and the patient's age.  She is not anticoagulated.  She is having mild focal tenderness in the right abdomen without ecchymosis.  No tenderness over the chest wall or ribs.  Plan for CT imaging of the abdomen and pelvis after screening blood work  and will send UA.  The UA was sent from urgent care with positive nitrites and small blood.  Patient is not having UTI symptoms.  Plan for UA here along with culture.   06:05 PM  CT imaging reviewed with no acute findings.  Patient's labs and urinalysis reviewed.  She does have evidence of a urinary tract infection but is not experiencing any UTI symptoms.  She is on frequent antibiotics for urinary tract infections.  I reviewed her prior urine cultures which have grown E. coli in the past.  I will prescribe a watch and wait prescription to take if UTI symptoms develop and have discussed this with the patient's son.  Will send the urine for culture here as well in case this is more colonization.  ____________________________________________  FINAL CLINICAL IMPRESSION(S) / ED DIAGNOSES  Final diagnoses:  Fall, initial encounter  Right sided abdominal pain     MEDICATIONS GIVEN DURING THIS VISIT:  Medications  iohexol (OMNIPAQUE) 300 MG/ML solution 100 mL (100 mLs Intravenous Contrast Given 10/22/19 1721)     NEW OUTPATIENT MEDICATIONS STARTED DURING THIS VISIT:  New Prescriptions   NITROFURANTOIN, MACROCRYSTAL-MONOHYDRATE, (MACROBID) 100 MG CAPSULE    Take 1 capsule (100 mg total) by mouth 2 (two) times daily for 7 days.    Note:  This document was prepared using Dragon voice recognition software and may include unintentional dictation errors.  Nanda Quinton, MD, [facility] Emergency Medicine    Keimora Swartout, [facility], MD 10/22/19 [phone]

## 2019-10-24 ENCOUNTER — Telehealth: Payer: Self-pay | Admitting: Internal Medicine

## 2019-10-24 NOTE — Telephone Encounter (Signed)
New message:   Kayla Davis is calling from [facility] to get a new prescription of simvastatin (ZOCOR) 40 MG tablet due to the one they received not being signed by the provider. Reference # is [id number]. Please advise.

## 2019-10-25 ENCOUNTER — Other Ambulatory Visit: Payer: Self-pay

## 2019-10-25 LAB — URINE CULTURE: Culture: >=100000 — AB

## 2019-10-25 MED ORDER — SIMVASTATIN 40 MG PO TABS: ORAL_TABLET | ORAL | 3 refills | Status: DC

## 2019-10-25 NOTE — Telephone Encounter (Signed)
New Rx has been printed and faxed to [facility]

## 2019-10-26 NOTE — Progress Notes (Signed)
ED Antimicrobial Stewardship Positive Culture Follow Up   Kayla Davis is an 86 y.o. female who presented to [facility] on 10/22/2019 with a chief complaint of abdominal pain 2/2 mechanical fall Chief Complaint  Patient presents with  . Fall    Recent Results (from the past 720 hour(s))  Urine culture     Status: Abnormal   Collection Time: 10/22/19  4:26 PM   Specimen: Urine, Clean Catch  Result Value Ref Range Status   Specimen Description   Final    URINE, CLEAN CATCH Performed at [facility], [address] [place], [place] [address]    Special Requests   Final    NONE Performed at [facility], [address] [place], [place] [address]    Culture >=100,000 COLONIES/mL [name]  Final   Report Status 10/25/2019 FINAL  Final   Organism ID, Bacteria MORGANELLA MORGANII (A)  Final      Susceptibility   Morganella morganii - MIC*    AMPICILLIN >=32 RESISTANT Resistant     CEFAZOLIN >=64 RESISTANT Resistant     CIPROFLOXACIN <=0.25 SENSITIVE Sensitive     GENTAMICIN <=1 SENSITIVE Sensitive     IMIPENEM 4 SENSITIVE Sensitive     NITROFURANTOIN 128 RESISTANT Resistant     TRIMETH/SULFA <=20 SENSITIVE Sensitive     AMPICILLIN/SULBACTAM >=32 RESISTANT Resistant     PIP/TAZO <=4 SENSITIVE Sensitive     * >=100,000 COLONIES/mL MORGANELLA MORGANII    Plan:  Call pt for symptom check, if no urinary symptoms no treatment.  If positive for urinary symptoms start Cipro 250mg  PO BID x 3d, choices restricted d/t resistant Morganella and pt allergies.  ED Provider: Nanda Quinton, MD   Bertis Ruddy, PharmD Clinical Pharmacist ED Pharmacist Phone # [phone] 10/26/2019 11:04 AM

## 2019-11-10 ENCOUNTER — Ambulatory Visit (INDEPENDENT_AMBULATORY_CARE_PROVIDER_SITE_OTHER): Payer: MEDICARE | Admitting: Internal Medicine

## 2019-11-10 ENCOUNTER — Encounter: Payer: Self-pay | Admitting: Internal Medicine

## 2019-11-10 ENCOUNTER — Other Ambulatory Visit: Payer: Self-pay

## 2019-11-10 DIAGNOSIS — N390 Urinary tract infection, site not specified: Secondary | ICD-10-CM

## 2019-11-10 DIAGNOSIS — E119 Type 2 diabetes mellitus without complications: Secondary | ICD-10-CM | POA: Diagnosis not present

## 2019-11-10 DIAGNOSIS — R269 Unspecified abnormalities of gait and mobility: Secondary | ICD-10-CM

## 2019-11-10 MED ORDER — SIMVASTATIN 40 MG PO TABS: ORAL_TABLET | ORAL | 3 refills | Status: DC

## 2019-11-10 MED ORDER — LOSARTAN POTASSIUM 50 MG PO TABS: 50.0000 mg | ORAL_TABLET | Freq: Every day | ORAL | 3 refills | Status: DC

## 2019-11-10 MED ORDER — LEVOTHYROXINE SODIUM 100 MCG PO TABS: 100.0000 ug | ORAL_TABLET | Freq: Every day | ORAL | 3 refills | Status: DC

## 2019-11-10 MED ORDER — FOSFOMYCIN TROMETHAMINE 3 G PO PACK: 3.0000 g | PACK | Freq: Once | ORAL | 0 refills | Status: AC

## 2019-11-10 NOTE — Patient Instructions (Signed)
Result Notes Component 2 wk ago  Specimen Description URINE, CLEAN CATCH  Performed at [facility], [address] [place], [place] [address]   Special Requests NONE  Performed at [facility], [address] [place], [place] [address]   Culture >=100,000 COLONIES/mL [facility] MORGANIIAbnormal   Report Status 10/25/2019 FINAL   Organism ID, Bacteria MORGANELLA MORGANIIAbnormal   Resulting Agency [facility]  Susceptibility   Morganella morganii    MIC    AMPICILLIN >=32 RESIST... Resistant    AMPICILLIN/SULBACTAM >=32 RESIST... Resistant    CEFAZOLIN >=64 RESIST... Resistant    CIPROFLOXACIN <=0.25 SENS... Sensitive    GENTAMICIN <=1 SENSITIVE  Sensitive    IMIPENEM 4 SENSITIVE  Sensitive    NITROFURANTOIN 128 RESISTANT  Resistant    PIP/TAZO <=4 SENSITIVE  Sensitive    TRIMETH/SULFA <=20 SENSIT... Sensitive         Susceptibility Comments

## 2019-11-10 NOTE — Assessment & Plan Note (Addendum)
Macrobid x 2 prn Estrace cream per Urol 10/2019 Morganella - resistant to Macrobid

## 2019-11-10 NOTE — Addendum Note (Signed)
Addended by: Earnstine Regal on: 11/10/2019 04:03 PM   Modules accepted: Orders

## 2019-11-10 NOTE — Progress Notes (Signed)
Resent Losartan to [facility].Marland KitchenJohny Chess

## 2019-11-10 NOTE — Addendum Note (Signed)
Addended by: Earnstine Regal on: 11/10/2019 04:01 PM   Modules accepted: Orders

## 2019-11-10 NOTE — Assessment & Plan Note (Addendum)
Fall on 10/22/19 -  Tripped over shoes ER records reviewed

## 2019-11-10 NOTE — Addendum Note (Signed)
Addended by: Earnstine Regal on: 11/10/2019 03:57 PM   Modules accepted: Orders

## 2019-11-10 NOTE — Assessment & Plan Note (Signed)
Labs

## 2019-11-10 NOTE — Addendum Note (Signed)
Addended by: Earnstine Regal on: 11/10/2019 03:59 PM   Modules accepted: Orders

## 2019-11-10 NOTE — Progress Notes (Signed)
Subjective:  Patient ID: Kayla Davis, female    DOB: 1922/08/28  Age: 86 y.o. MRN: [id number]  CC: No chief complaint on file.   HPI Kayla Davis presents for dyslipidemia, HTN, hypothyroidism  Outpatient Medications Prior to Visit  Medication Sig Dispense Refill  . ALPHA LIPOIC ACID PO Take 1 tablet by mouth daily.    [name] aspirin EC 81 MG tablet Take 1 tablet (81 mg total) by mouth daily. 90 tablet 3  . Coenzyme Q10 (CO Q 10 PO) Take 1 tablet by mouth daily.    [name] CRANBERRY PO Take 3 tablets by mouth daily with supper.     . Cyanocobalamin (VITAMIN B-12 PO) Take 1 tablet by mouth at bedtime.    Kayla Davis Calcium (STOOL SOFTENER PO) Take 2 capsules by mouth at bedtime.    [name] EVENING PRIMROSE OIL PO Take 1 capsule by mouth daily.    [name] glucose blood (ONE TOUCH ULTRA TEST) test strip TEST ONCE DAILY AS DIRECTED DX E11.9 100 each 5  . hydrochlorothiazide (MICROZIDE) 12.5 MG capsule Take 1 capsule (12.5 mg total) by mouth daily. 90 capsule 3  . ipratropium (ATROVENT) 0.06 % nasal spray Place 2 sprays into both nostrils 3 (three) times daily. (Patient taking differently: Place 2 sprays into both nostrils in the morning and at bedtime. ) 45 mL 3  . Lancets (ONETOUCH ULTRASOFT) lancets Use to check blood sugars once a day Dx E11.9 100 each 3  . metoprolol tartrate (LOPRESSOR) 25 MG tablet TAKE 1 TABLET BY MOUTH TWICE A DAY 180 tablet 3  . Multiple Vitamins-Minerals (EYE VITAMINS) CAPS Take 1 capsule by mouth daily.    Kayla Davis [name] (SYSTANE OP) Apply to eye daily.    . polyethylene glycol (MIRALAX / GLYCOLAX) packet Take 17 g by mouth daily as needed for mild constipation.    . pregabalin (LYRICA) 200 MG capsule Take 1 capsule (200 mg total) by mouth at bedtime. 90 capsule 1  . levothyroxine (SYNTHROID) 100 MCG tablet Take 1 tablet (100 mcg total) by mouth daily before breakfast. Annual appt due in June w/labs must see provider for future refills 90 tablet 0  .  losartan (COZAAR) 50 MG tablet Take 1 tablet (50 mg total) by mouth daily. 90 tablet 3  . simvastatin (ZOCOR) 40 MG tablet TAKE 1 TABLET DAILY AT 6PM 90 tablet 3  . meclizine (ANTIVERT) 12.5 MG tablet Take 1 tablet (12.5 mg total) by mouth 3 (three) times daily as needed for dizziness. (Patient not taking: Reported on 11/10/2019) 60 tablet 0   No facility-administered medications prior to visit.    ROS: Review of Systems  Constitutional: Positive for fatigue. Negative for activity change, appetite change, chills and unexpected weight change.  HENT: Negative for congestion, mouth sores and sinus pressure.   Eyes: Negative for visual disturbance.  Respiratory: Negative for cough and chest tightness.   Gastrointestinal: Negative for abdominal pain and nausea.  Genitourinary: Negative for difficulty urinating, frequency and vaginal pain.  Musculoskeletal: Positive for arthralgias and gait problem. Negative for back pain.  Skin: Negative for pallor and rash.  Neurological: Negative for dizziness, tremors, weakness, numbness and headaches.  Hematological: Bruises/bleeds easily.  Psychiatric/Behavioral: Positive for decreased concentration. Negative for confusion, sleep disturbance and suicidal ideas.    Objective:  BP (!) 146/72 (BP Location: Left Arm, Patient Position: Sitting, Cuff Size: Normal)   Pulse 79   Temp 98.3 F (36.8 C) (Oral)   Ht 5\' 5"  (1.651  m)   Wt 160 lb (72.6 kg)   LMP  (LMP Unknown)   SpO2 96%   BMI 26.63 kg/m   BP Readings from Last 3 Encounters:  11/10/19 (!) 146/72  10/22/19 (!) 156/57  09/29/19 126/62    Wt Readings from Last 3 Encounters:  11/10/19 160 lb (72.6 kg)  10/22/19 160 lb (72.6 kg)  09/29/19 162 lb (73.5 kg)    Physical Exam Constitutional:      General: She is not in acute distress.    Appearance: She is well-developed.  HENT:     Head: Normocephalic.     Right Ear: External ear normal.     Left Ear: External ear normal.     Nose: Nose  normal.  Eyes:     General:        Right eye: No discharge.        Left eye: No discharge.     Conjunctiva/sclera: Conjunctivae normal.     Pupils: Pupils are equal, round, and reactive to light.  Neck:     Thyroid: No thyromegaly.     Vascular: No JVD.     Trachea: No tracheal deviation.  Cardiovascular:     Rate and Rhythm: Normal rate and regular rhythm.     Heart sounds: Normal heart sounds.  Pulmonary:     Effort: No respiratory distress.     Breath sounds: No stridor. No wheezing.  Abdominal:     General: Bowel sounds are normal. There is no distension.     Palpations: Abdomen is soft. There is no mass.     Tenderness: There is no abdominal tenderness. There is no guarding or rebound.  Musculoskeletal:        General: No tenderness.     Cervical back: Normal range of motion and neck supple.  Lymphadenopathy:     Cervical: No cervical adenopathy.  Skin:    Findings: No erythema or rash.  Neurological:     Cranial Nerves: No cranial nerve deficit.     Motor: No abnormal muscle tone.     Coordination: Coordination abnormal.     Deep Tendon Reflexes: Reflexes normal.  Psychiatric:        Behavior: Behavior normal.        Thought Content: Thought content normal.        Judgment: Judgment normal.    Walker  Lab Results  Component Value Date   WBC 6.5 10/22/2019   HGB 11.8 (L) 10/22/2019   HCT 36.2 10/22/2019   PLT 174 10/22/2019   GLUCOSE 103 (H) 10/22/2019   CHOL 162 12/24/2016   TRIG 97.0 12/24/2016   HDL 80.70 12/24/2016   LDLDIRECT 143.0 10/07/2007   LDLCALC 62 12/24/2016   ALT 11 10/22/2019   AST 25 10/22/2019   NA 132 (L) 10/22/2019   K 4.4 10/22/2019   CL 98 10/22/2019   CREATININE 0.88 10/22/2019   BUN 18 10/22/2019   CO2 24 10/22/2019   TSH 10.14 (H) 04/15/2018   INR 1.1 04/30/2019   HGBA1C 6.5 04/15/2018    CT ABDOMEN PELVIS W CONTRAST  Result Date: 10/22/2019 CLINICAL DATA:  [place] yesterday. Increasing right upper quadrant abdominal pain.  EXAM: CT ABDOMEN AND PELVIS WITH CONTRAST TECHNIQUE: Multidetector CT imaging of the abdomen and pelvis was performed using the standard protocol following bolus administration of intravenous contrast. CONTRAST:  176mL OMNIPAQUE IOHEXOL 300 MG/ML  SOLN COMPARISON:  CT scan 03/04/2017 FINDINGS: Lower chest: Basilar scarring changes and streaky atelectasis. No effusions  or pulmonary lesions. No pneumothorax. The heart is mildly enlarged. Stable pacer wires and coronary artery and aortic calcifications. Mitral valve annular calcifications are also noted. Hepatobiliary: No acute hepatic injury or worrisome hepatic lesion. No perihepatic fluid collections. The gallbladder is surgically absent. Mild associated common bile duct dilatation. Pancreas: No mass, inflammation or ductal dilatation. Spleen: Normal size. No acute injury. No perisplenic fluid collection. Adrenals/Urinary Tract: The renal glands and kidneys are unremarkable. The bladder is normal. Stomach/Bowel: The stomach, duodenum, small bowel and colon are grossly normal without oral contrast. No acute inflammatory changes, mass lesions or obstructive findings. Moderate stool throughout the colon may suggest constipation. Vascular/Lymphatic: Advanced atherosclerotic calcifications involving the aorta and branch vessels. No aortic aneurysm or dissection. Reproductive: Surgically absent. Other: No pelvic mass or adenopathy. No free pelvic fluid collections. No inguinal mass or adenopathy. Periumbilical abdominal wall hernia containing fat. Musculoskeletal: The bony pelvis is intact. A left hip prosthesis is noted. No hip or pelvic fractures are identified. Scoliosis and advanced degenerative cervical spondylosis with multilevel disc disease and facet disease. No definite acute lumbar fracture. Bilateral pars defects are noted at L5 with a grade 1 spondylolisthesis. IMPRESSION: 1. No acute abdominal/pelvic findings, mass lesions or adenopathy. 2. Status post  cholecystectomy with mild associated common bile duct dilatation. 3. Advanced atherosclerotic calcifications involving the aorta and branch vessels. 4. Moderate stool throughout the colon may suggest constipation. 5. No acute bony findings. Electronically Signed   By: Marijo Sanes M.D.   On: 10/22/2019 17:55    Assessment & Plan:   There are no diagnoses linked to this encounter.   Meds ordered this encounter  Medications  . levothyroxine (SYNTHROID) 100 MCG tablet    Sig: Take 1 tablet (100 mcg total) by mouth daily before breakfast. Annual appt due in June w/labs must see provider for future refills    Dispense:  90 tablet    Refill:  3  . losartan (COZAAR) 50 MG tablet    Sig: Take 1 tablet (50 mg total) by mouth daily.    Dispense:  90 tablet    Refill:  3  . simvastatin (ZOCOR) 40 MG tablet    Sig: TAKE 1 TABLET DAILY    Dispense:  90 tablet    Refill:  3     Follow-up: No follow-ups on file.  [name], MD

## 2019-11-10 NOTE — Progress Notes (Signed)
Resent [facility].Marland KitchenJohny Chess

## 2019-12-19 ENCOUNTER — Other Ambulatory Visit: Payer: Self-pay | Admitting: Internal Medicine

## 2019-12-23 ENCOUNTER — Other Ambulatory Visit: Payer: Self-pay | Admitting: Internal Medicine

## 2019-12-23 DIAGNOSIS — Z23 Encounter for immunization: Secondary | ICD-10-CM | POA: Diagnosis not present

## 2020-01-09 DIAGNOSIS — Z23 Encounter for immunization: Secondary | ICD-10-CM | POA: Diagnosis not present

## 2020-01-17 ENCOUNTER — Ambulatory Visit (INDEPENDENT_AMBULATORY_CARE_PROVIDER_SITE_OTHER): Payer: MEDICARE

## 2020-01-17 DIAGNOSIS — I495 Sick sinus syndrome: Secondary | ICD-10-CM

## 2020-01-23 LAB — CUP PACEART REMOTE DEVICE CHECK
Battery Impedance: 1624 Ohm
Battery Remaining Longevity: 42 mo
Battery Voltage: 2.76 V
Brady Statistic AP VP Percent: 8 %
Brady Statistic AP VS Percent: 90 %
Brady Statistic AS VP Percent: 2 %
Brady Statistic AS VS Percent: 1 %
Date Time Interrogation Session: 20211010202017
Implantable Lead Implant Date: 20111031
Implantable Lead Implant Date: 20111031
Implantable Lead Location: 753859
Implantable Lead Location: 753860
Implantable Lead Model: 4469
Implantable Lead Model: 4470
Implantable Lead Serial Number: 548557
Implantable Lead Serial Number: 686076
Implantable Pulse Generator Implant Date: 20111031
Lead Channel Impedance Value: 488 Ohm
Lead Channel Impedance Value: 516 Ohm
Lead Channel Pacing Threshold Amplitude: 0.625 V
Lead Channel Pacing Threshold Amplitude: 0.75 V
Lead Channel Pacing Threshold Pulse Width: 0.4 ms
Lead Channel Pacing Threshold Pulse Width: 0.4 ms
Lead Channel Setting Pacing Amplitude: 2 V
Lead Channel Setting Pacing Amplitude: 2.5 V
Lead Channel Setting Pacing Pulse Width: 0.4 ms
Lead Channel Setting Sensing Sensitivity: 5.6 mV

## 2020-01-31 NOTE — Progress Notes (Signed)
Remote pacemaker transmission.   

## 2020-02-16 ENCOUNTER — Encounter: Payer: Self-pay | Admitting: Internal Medicine

## 2020-02-16 ENCOUNTER — Other Ambulatory Visit: Payer: Self-pay

## 2020-02-16 ENCOUNTER — Ambulatory Visit (INDEPENDENT_AMBULATORY_CARE_PROVIDER_SITE_OTHER): Payer: MEDICARE | Admitting: Internal Medicine

## 2020-02-16 DIAGNOSIS — N390 Urinary tract infection, site not specified: Secondary | ICD-10-CM

## 2020-02-16 DIAGNOSIS — E034 Atrophy of thyroid (acquired): Secondary | ICD-10-CM | POA: Diagnosis not present

## 2020-02-16 DIAGNOSIS — I10 Essential (primary) hypertension: Secondary | ICD-10-CM

## 2020-02-16 DIAGNOSIS — R269 Unspecified abnormalities of gait and mobility: Secondary | ICD-10-CM

## 2020-02-16 DIAGNOSIS — E119 Type 2 diabetes mellitus without complications: Secondary | ICD-10-CM | POA: Diagnosis not present

## 2020-02-16 LAB — URINALYSIS, ROUTINE W REFLEX MICROSCOPIC
Bilirubin Urine: NEGATIVE
Ketones, ur: NEGATIVE
Nitrite: NEGATIVE
Specific Gravity, Urine: 1.015 (ref 1.000–1.030)
Total Protein, Urine: NEGATIVE
Urine Glucose: NEGATIVE
Urobilinogen, UA: 0.2 (ref 0.0–1.0)
pH: 7.5 (ref 5.0–8.0)

## 2020-02-16 LAB — COMPREHENSIVE METABOLIC PANEL
ALT: 13 U/L (ref 0–35)
AST: 19 U/L (ref 0–37)
Albumin: 4 g/dL (ref 3.5–5.2)
Alkaline Phosphatase: 71 U/L (ref 39–117)
BUN: 19 mg/dL (ref 6–23)
CO2: 31 mEq/L (ref 19–32)
Calcium: 9.4 mg/dL (ref 8.4–10.5)
Chloride: 95 mEq/L — ABNORMAL LOW (ref 96–112)
Creatinine, Ser: 0.85 mg/dL (ref 0.40–1.20)
GFR: 58.34 mL/min — ABNORMAL LOW (ref >60.00)
Glucose, Bld: 67 mg/dL — ABNORMAL LOW (ref 70–99)
Potassium: 4.3 mEq/L (ref 3.5–5.1)
Sodium: 131 mEq/L — ABNORMAL LOW (ref 135–145)
Total Bilirubin: 0.4 mg/dL (ref 0.2–1.2)
Total Protein: 6.4 g/dL (ref 6.0–8.3)

## 2020-02-16 LAB — CBC WITH DIFFERENTIAL/PLATELET
Basophils Absolute: 0.1 10*3/uL (ref 0.0–0.1)
Basophils Relative: 1.4 % (ref 0.0–3.0)
Eosinophils Absolute: 0.7 10*3/uL (ref 0.0–0.7)
Eosinophils Relative: 11.4 % — ABNORMAL HIGH (ref 0.0–5.0)
HCT: 34.8 % — ABNORMAL LOW (ref 36.0–46.0)
Hemoglobin: 11.8 g/dL — ABNORMAL LOW (ref 12.0–15.0)
Lymphocytes Relative: 31.1 % (ref 12.0–46.0)
Lymphs Abs: 2.1 10*3/uL (ref 0.7–4.0)
MCHC: 33.9 g/dL (ref 30.0–36.0)
MCV: 94.2 fl (ref 78.0–100.0)
Monocytes Absolute: 0.8 10*3/uL (ref 0.1–1.0)
Monocytes Relative: 11.5 % (ref 3.0–12.0)
Neutro Abs: 3 10*3/uL (ref 1.4–7.7)
Neutrophils Relative %: 44.6 % (ref 43.0–77.0)
Platelets: 190 10*3/uL (ref 150.0–400.0)
RBC: 3.7 Mil/uL — ABNORMAL LOW (ref 3.87–5.11)
RDW: 13.6 % (ref 11.5–15.5)
WBC: 6.6 10*3/uL (ref 4.0–10.5)

## 2020-02-16 LAB — HEMOGLOBIN A1C: Hgb A1c MFr Bld: 6.7 % — ABNORMAL HIGH (ref 4.6–6.5)

## 2020-02-16 LAB — TSH: TSH: 8.98 u[IU]/mL — ABNORMAL HIGH (ref 0.35–4.50)

## 2020-02-16 LAB — T4, FREE: Free T4: 1.07 ng/dL (ref 0.60–1.60)

## 2020-02-16 NOTE — Assessment & Plan Note (Signed)
A1c

## 2020-02-16 NOTE — Assessment & Plan Note (Signed)
Walker

## 2020-02-16 NOTE — Assessment & Plan Note (Signed)
  BP Readings from Last 3 Encounters:  02/16/20 122/64  11/10/19 (!) 146/72  10/22/19 (!) 156/57

## 2020-02-16 NOTE — Progress Notes (Addendum)
Subjective:  Patient ID: Kayla Davis, female    DOB: June 27, 1922  Age: 86 y.o. MRN: [id number]  CC: Follow-up (3 MONTH F/U)   HPI [name] presents for HTN, abn UA, OA f/u  Outpatient Medications Prior to Visit  Medication Sig Dispense Refill  . ALPHA LIPOIC ACID PO Take 1 tablet by mouth daily.    [name] aspirin 81 MG EC tablet TAKE 1 TABLET BY MOUTH EVERY DAY 90 tablet 3  . Coenzyme Q10 (CO Q 10 PO) Take 1 tablet by mouth daily.    [name] CRANBERRY PO Take 3 tablets by mouth daily with supper.     . Cyanocobalamin (VITAMIN B-12 PO) Take 1 tablet by mouth at bedtime.    [name] Calcium (STOOL SOFTENER PO) Take 1 capsule by mouth at bedtime.     [name] EVENING PRIMROSE OIL PO Take 1 capsule by mouth daily.    [name] glucose blood (ONE TOUCH ULTRA TEST) test strip TEST ONCE DAILY AS DIRECTED DX E11.9 100 each 5  . hydrochlorothiazide (MICROZIDE) 12.5 MG capsule Take 1 capsule (12.5 mg total) by mouth daily. 90 capsule 3  . ipratropium (ATROVENT) 0.06 % nasal spray Place 2 sprays into both nostrils 3 (three) times daily. (Patient taking differently: Place 2 sprays into both nostrils in the morning and at bedtime. ) 45 mL 3  . Lancets (ONETOUCH ULTRASOFT) lancets Use to check blood sugars once a day Dx E11.9 100 each 3  . levothyroxine (SYNTHROID) 100 MCG tablet Take 1 tablet (100 mcg total) by mouth daily before breakfast. 90 tablet 3  . losartan (COZAAR) 50 MG tablet Take 1 tablet (50 mg total) by mouth daily. 90 tablet 3  . metoprolol tartrate (LOPRESSOR) 25 MG tablet TAKE 1 TABLET BY MOUTH TWICE A DAY 180 tablet 3  . Multiple Vitamins-Minerals (EYE VITAMINS) CAPS Take 1 capsule by mouth daily.    [name] (SYSTANE OP) Apply to eye daily.    . polyethylene glycol (MIRALAX / GLYCOLAX) packet Take 17 g by mouth daily as needed for mild constipation.    . pregabalin (LYRICA) 200 MG capsule Take 1 capsule (200 mg total) by mouth at bedtime. 90 capsule 1  . simvastatin  (ZOCOR) 40 MG tablet TAKE 1 TABLET DAILY 90 tablet 3  . meclizine (ANTIVERT) 12.5 MG tablet Take 1 tablet (12.5 mg total) by mouth 3 (three) times daily as needed for dizziness. (Patient not taking: Reported on 11/10/2019) 60 tablet 0   No facility-administered medications prior to visit.    ROS: Review of Systems  Constitutional: Positive for fatigue. Negative for activity change, appetite change, chills and unexpected weight change.  HENT: Negative for congestion, mouth sores and sinus pressure.   Eyes: Negative for visual disturbance.  Respiratory: Negative for cough and chest tightness.   Gastrointestinal: Negative for abdominal pain and nausea.  Genitourinary: Positive for dysuria, frequency and vaginal pain. Negative for difficulty urinating.  Musculoskeletal: Negative for back pain and gait problem.  Skin: Negative for pallor and rash.  Neurological: Positive for weakness. Negative for dizziness, tremors, numbness and headaches.  Psychiatric/Behavioral: Negative for confusion and sleep disturbance.    Objective:  BP 122/64 (BP Location: Left Arm)   Pulse 65   Temp 98.4 F (36.9 C) (Oral)   Wt 160 lb (72.6 kg)   LMP  (LMP Unknown)   SpO2 99%   BMI 26.63 kg/m   BP Readings from Last 3 Encounters:  02/16/20 122/64  11/10/19 [name])  146/72  10/22/19 (!) 156/57    Wt Readings from Last 3 Encounters:  02/16/20 160 lb (72.6 kg)  11/10/19 160 lb (72.6 kg)  10/22/19 160 lb (72.6 kg)    Physical Exam Constitutional:      General: She is not in acute distress.    Appearance: She is well-developed.  HENT:     Head: Normocephalic.     Right Ear: External ear normal.     Left Ear: External ear normal.     Nose: Nose normal.  Eyes:     General:        Right eye: No discharge.        Left eye: No discharge.     Conjunctiva/sclera: Conjunctivae normal.     Pupils: Pupils are equal, round, and reactive to light.  Neck:     Thyroid: No thyromegaly.     Vascular: No JVD.      Trachea: No tracheal deviation.  Cardiovascular:     Rate and Rhythm: Normal rate and regular rhythm.     Heart sounds: Normal heart sounds.  Pulmonary:     Effort: No respiratory distress.     Breath sounds: No stridor. No wheezing.  Abdominal:     General: Bowel sounds are normal. There is no distension.     Palpations: Abdomen is soft. There is no mass.     Tenderness: There is no abdominal tenderness. There is no guarding or rebound.  Musculoskeletal:        General: No tenderness.     Cervical back: Normal range of motion and neck supple.  Lymphadenopathy:     Cervical: No cervical adenopathy.  Skin:    Findings: No erythema or rash.  Neurological:     Mental Status: She is oriented to person, place, and time.     Cranial Nerves: No cranial nerve deficit.     Motor: Weakness present. No abnormal muscle tone.     Coordination: Coordination abnormal.     Gait: Gait abnormal.     Deep Tendon Reflexes: Reflexes normal.  Psychiatric:        Behavior: Behavior normal.        Thought Content: Thought content normal.        Judgment: Judgment normal.   walker   Lab Results  Component Value Date   WBC 6.5 10/22/2019   HGB 11.8 (L) 10/22/2019   HCT 36.2 10/22/2019   PLT 174 10/22/2019   GLUCOSE 103 (H) 10/22/2019   CHOL 162 12/24/2016   TRIG 97.0 12/24/2016   HDL 80.70 12/24/2016   LDLDIRECT 143.0 10/07/2007   LDLCALC 62 12/24/2016   ALT 11 10/22/2019   AST 25 10/22/2019   NA 132 (L) 10/22/2019   K 4.4 10/22/2019   CL 98 10/22/2019   CREATININE 0.88 10/22/2019   BUN 18 10/22/2019   CO2 24 10/22/2019   TSH 10.14 (H) 04/15/2018   INR 1.1 04/30/2019   HGBA1C 6.5 04/15/2018    CT ABDOMEN PELVIS W CONTRAST  Result Date: 10/22/2019 CLINICAL DATA:  [place] yesterday. Increasing right upper quadrant abdominal pain. EXAM: CT ABDOMEN AND PELVIS WITH CONTRAST TECHNIQUE: Multidetector CT imaging of the abdomen and pelvis was performed using the standard protocol following  bolus administration of intravenous contrast. CONTRAST:  15mL OMNIPAQUE IOHEXOL 300 MG/ML  SOLN COMPARISON:  CT scan 03/04/2017 FINDINGS: Lower chest: Basilar scarring changes and streaky atelectasis. No effusions or pulmonary lesions. No pneumothorax. The heart is mildly enlarged. Stable pacer wires and  coronary artery and aortic calcifications. Mitral valve annular calcifications are also noted. Hepatobiliary: No acute hepatic injury or worrisome hepatic lesion. No perihepatic fluid collections. The gallbladder is surgically absent. Mild associated common bile duct dilatation. Pancreas: No mass, inflammation or ductal dilatation. Spleen: Normal size. No acute injury. No perisplenic fluid collection. Adrenals/Urinary Tract: The renal glands and kidneys are unremarkable. The bladder is normal. Stomach/Bowel: The stomach, duodenum, small bowel and colon are grossly normal without oral contrast. No acute inflammatory changes, mass lesions or obstructive findings. Moderate stool throughout the colon may suggest constipation. Vascular/Lymphatic: Advanced atherosclerotic calcifications involving the aorta and branch vessels. No aortic aneurysm or dissection. Reproductive: Surgically absent. Other: No pelvic mass or adenopathy. No free pelvic fluid collections. No inguinal mass or adenopathy. Periumbilical abdominal wall hernia containing fat. Musculoskeletal: The bony pelvis is intact. A left hip prosthesis is noted. No hip or pelvic fractures are identified. Scoliosis and advanced degenerative cervical spondylosis with multilevel disc disease and facet disease. No definite acute lumbar fracture. Bilateral pars defects are noted at L5 with a grade 1 spondylolisthesis. IMPRESSION: 1. No acute abdominal/pelvic findings, mass lesions or adenopathy. 2. Status post cholecystectomy with mild associated common bile duct dilatation. 3. Advanced atherosclerotic calcifications involving the aorta and branch vessels. 4. Moderate  stool throughout the colon may suggest constipation. 5. No acute bony findings. Electronically Signed   By: Marijo Sanes M.D.   On: 10/22/2019 17:55    Assessment & Plan:    [name], MD

## 2020-02-16 NOTE — Assessment & Plan Note (Signed)
Recurrent sx's UA and Cx

## 2020-02-16 NOTE — Addendum Note (Signed)
Addended by: Cresenciano Lick on: 02/16/2020 02:54 PM   Modules accepted: Orders

## 2020-02-16 NOTE — Assessment & Plan Note (Signed)
Labs

## 2020-02-19 LAB — CULTURE, URINE COMPREHENSIVE

## 2020-02-24 ENCOUNTER — Telehealth: Payer: Self-pay | Admitting: Internal Medicine

## 2020-02-24 NOTE — Telephone Encounter (Signed)
Patient calling inquiring about lab results from her last visit on 11.4.21  Please contact the patien

## 2020-02-26 ENCOUNTER — Other Ambulatory Visit: Payer: Self-pay | Admitting: Internal Medicine

## 2020-02-28 NOTE — Telephone Encounter (Signed)
Called pt there was no answer LMOM RTC...lmb 

## 2020-03-01 ENCOUNTER — Ambulatory Visit (INDEPENDENT_AMBULATORY_CARE_PROVIDER_SITE_OTHER): Payer: MEDICARE

## 2020-03-01 ENCOUNTER — Other Ambulatory Visit: Payer: Self-pay

## 2020-03-01 VITALS — BP 130/70 | HR 66 | Temp 98.2°F | Ht 65.0 in | Wt 156.2 lb

## 2020-03-01 DIAGNOSIS — Z0000 Encounter for general adult medical examination without abnormal findings: Secondary | ICD-10-CM

## 2020-03-01 NOTE — Progress Notes (Addendum)
Subjective:   Kayla Davis is a 86 y.o. female who presents for Medicare Annual (Subsequent) preventive examination.  Review of Systems    No ROS. Medicare Wellness Visit. Additional risk factors are reflected in social history. Cardiac Risk Factors include: advanced age (>68men, >59 women);dyslipidemia;family history of premature cardiovascular disease;hypertension     Objective:    Today's Vitals   03/01/20 1623  BP: 130/70  Pulse: 66  Temp: 98.2 F (36.8 C)  SpO2: 95%  Weight: 156 lb 3.2 oz (70.9 kg)  Height: 5\' 5"  (1.651 m)   Body mass index is 25.99 kg/m.  Advanced Directives 03/01/2020 10/22/2019 04/30/2019 03/01/2019 10/21/2018 06/18/2018 02/25/2018  Does Patient Have a Medical Advance Directive? Yes No Yes Yes Yes No Yes  Type of Advance Directive Living will;Healthcare Power of [place];Living will - - [place]  Does patient want to make changes to medical advance directive? No - Patient declined - - - - - Yes (ED - Information included in AVS)  Copy of [place] in Chart? No - copy requested - - No - copy requested - - No - copy requested  Would patient like information on creating a medical advance directive? - - - - - No - Patient declined -  Pre-existing out of facility DNR order (yellow form or pink MOST form) - - - - - - -    Current Medications (verified) Outpatient Encounter Medications as of 03/01/2020  Medication Sig  . ALPHA LIPOIC ACID PO Take 1 tablet by mouth daily.  [name] aspirin 81 MG EC tablet TAKE 1 TABLET BY MOUTH EVERY DAY  . Coenzyme Q10 (CO Q 10 PO) Take 1 tablet by mouth daily.  [name] CRANBERRY PO Take 3 tablets by mouth daily with supper.   . Cyanocobalamin (VITAMIN B-12 PO) Take 1 tablet by mouth at bedtime.  [name] Calcium (STOOL SOFTENER PO) Take 1 capsule by mouth at bedtime.   [name] EVENING PRIMROSE OIL PO Take 1 capsule by mouth daily.  [name] glucose blood (ONE TOUCH  ULTRA TEST) test strip TEST ONCE DAILY AS DIRECTED DX E11.9  . hydrochlorothiazide (MICROZIDE) 12.5 MG capsule Take 1 capsule (12.5 mg total) by mouth daily.  [name] ipratropium (ATROVENT) 0.06 % nasal spray Place 2 sprays into both nostrils 3 (three) times daily. (Patient taking differently: Place 2 sprays into both nostrils in the morning and at bedtime. )  . Lancets (ONETOUCH ULTRASOFT) lancets Use to check blood sugars once a day Dx E11.9  . levothyroxine (SYNTHROID) 100 MCG tablet Take 1 tablet (100 mcg total) by mouth daily before breakfast.  . losartan (COZAAR) 50 MG tablet Take 1 tablet (50 mg total) by mouth daily.  . metoprolol tartrate (LOPRESSOR) 25 MG tablet TAKE 1 TABLET BY MOUTH TWICE A DAY  . Multiple Vitamins-Minerals (EYE VITAMINS) CAPS Take 1 capsule by mouth daily.  [name] (SYSTANE OP) Apply to eye daily.  . polyethylene glycol (MIRALAX / GLYCOLAX) packet Take 17 g by mouth daily as needed for mild constipation.  . pregabalin (LYRICA) 200 MG capsule Take 1 capsule (200 mg total) by mouth at bedtime.  . simvastatin (ZOCOR) 40 MG tablet TAKE 1 TABLET DAILY   No facility-administered encounter medications on file as of 03/01/2020.    Allergies (verified) Penicillins, Alprazolam, Bactrim [sulfamethoxazole-trimethoprim], Ceclor [cefaclor], Cephalexin, Ciprofloxacin, Clindamycin/lincomycin, Hydrocodone, Noroxin [norfloxacin], Sulfonamide derivatives, Tetracyclines & related, and Tramadol   History: Past Medical  History:  Diagnosis Date  . Anemia, unspecified   . Anxiety state, unspecified   . Aortic valve disorders   . Bradycardia    s/p PPM  . Bursitis   . Complication of anesthesia    has had difficulty awakening in past; pt states did well with the anesthesia given during her carotid surgery   . Diabetes mellitus without complication ([place])   . Diverticulosis of colon (without mention of hemorrhage)   . Herpes zoster with other nervous system  complications(053.19)   . Hyperlipidemia   . Hypertension   . Hypothyroidism   . Irritable bowel syndrome   . Osteoarthrosis, unspecified whether generalized or localized, unspecified site   . Peripheral vascular disease ([place])   . Squamous cell carcinoma of skin 04/21/2012   left sideburn - tx p bx  . Squamous cell carcinoma of skin 01/10/2013   recurrent on left sideburn - tx p bx  . Squamous cell carcinoma of skin 08/22/2013   left sideburn - tx p bx  . TIA (transient ischemic attack)   . Unspecified hearing loss   . Varicose veins    Past Surgical History:  Procedure Laterality Date  . APPENDECTOMY    . CATARACT EXTRACTION, BILATERAL  end of June 2013  . CHOLECYSTECTOMY    . DILATION AND CURETTAGE OF UTERUS    . ENDARTERECTOMY Right 01/20/2014   Procedure: RIGHT CAROTID ARTERY ENDARTERECTOMY WITH DACRON PATCH ANGIOPLASTY;  Surgeon: Mal Misty, MD;  Location: [facility];  Service: Vascular;  Laterality: Right;  . PACEMAKER INSERTION     MDT dual chamber PPM implanted by Dr [name] for sinus node dysfunction   . TONSILLECTOMY    . TOTAL HIP ARTHROPLASTY Left 03/23/2015   Procedure: TOTAL HIP ARTHROPLASTY ANTERIOR APPROACH;  Surgeon: Gaynelle Arabian, MD;  Location: [facility];  Service: Orthopedics;  Laterality: Left;  [name] VAGINAL HYSTERECTOMY     Family History  Problem Relation Age of Onset  . Heart disease Mother   . Heart disease Father   . Diabetes Father   . Cancer Paternal Grandmother    Social History   Socioeconomic History  . Marital status: Widowed    Spouse name: Gypsy Balsam.  (deceased)  . Number of children: 4  . Years of education: COLLEGE  . Highest education level: Not on file  Occupational History  . Occupation: Retired    Fish farm manager: RETIRED  Tobacco Use  . Smoking status: Never Smoker  . Smokeless tobacco: Never Used  Vaping Use  . Vaping Use: Never used  Substance and Sexual Activity  . Alcohol use: No    Alcohol/week: 0.0 standard drinks  . Drug use: No    . Sexual activity: Not Currently  Other Topics Concern  . Not on file  Social History Narrative   Patient is widowed with 4 children. Son stays with patient at night, 08/11/16.   Patient is right handed.   Patient has college education.   Patient drinks 2 cups daily.   Social Determinants of Health   Financial Resource Strain: Low Risk   . Difficulty of Paying Living Expenses: Not hard at all  Food Insecurity: No Food Insecurity  . Worried About Charity fundraiser in the Last Year: Never true  . Ran Out of Food in the Last Year: Never true  Transportation Needs: No Transportation Needs  . Lack of Transportation (Medical): No  . Lack of Transportation (Non-Medical): No  Physical Activity: Insufficiently Active  . Days of Exercise  per Week: 5 days  . Minutes of Exercise per Session: 20 min  Stress: No Stress Concern Present  . Feeling of Stress : Not at all  Social Connections:   . Frequency of Communication with Friends and Family: Not on file  . Frequency of Social Gatherings with Friends and Family: Not on file  . Attends Religious Services: Not on file  . Active Member of Clubs or Organizations: Not on file  . Attends Archivist Meetings: Not on file  . Marital Status: Not on file    Tobacco Counseling Counseling given: Not Answered   Clinical Intake:  Pre-visit preparation completed: Yes  Pain : No/denies pain     BMI - recorded: 25.99 Nutritional Status: BMI 25 -29 Overweight Nutritional Risks: None Diabetes: No  How often do you need to have someone help you when you read instructions, pamphlets, or other written materials from your doctor or pharmacy?: 1 - Never What is the last grade level you completed in school?: College education  Diabetic? no  Interpreter Needed?: No  Information entered by :: Lisette Abu, LPN   Activities of Daily Living In your present state of health, do you have any difficulty performing the following  activities: 03/01/2020  Hearing? Y  Vision? N  Difficulty concentrating or making decisions? Y  Walking or climbing stairs? Y  Dressing or bathing? N  Doing errands, shopping? Y  Preparing Food and eating ? N  Using the Toilet? N  In the past six months, have you accidently leaked urine? Y  Do you have problems with loss of bowel control? N  Managing your Medications? N  Managing your Finances? N  Housekeeping or managing your Housekeeping? N  Some recent data might be hidden    Patient Care Team: Plotnikov, Evie Lacks, MD as PCP - General (Internal Medicine) Shon Hough, MD as Consulting Physician (Ophthalmology) Thompson Grayer, MD as Consulting Physician (Cardiology) Suella Broad, MD as Consulting Physician (Physical Medicine and Rehabilitation) Gaynelle Arabian, MD as Consulting Physician (Orthopedic Surgery) Nickie Retort, MD as Consulting Physician (Urology) [name], DO as Consulting Physician (Neurology)  Indicate any recent Medical Services you may have received from other than [facility] providers in the past year (date may be approximate).     Assessment:   This is a routine wellness examination for Kayla Davis.  Hearing/Vision screen No exam data present  Dietary issues and exercise activities discussed: Current Exercise Habits: Home exercise routine, Type of exercise: walking (walks up and down her ramp), Time (Minutes): 30, Frequency (Times/Week): 4, Weekly Exercise (Minutes/Week): 120, Intensity: Mild, Exercise limited by: cardiac condition(s)  Goals    .  Continue to do my  normal activities, enjoy family and church    .  patient (pt-stated)      Wants to live to see 76 and 84 year old grow up. Keep exercising; keep moving; keep busy      Depression Screen PHQ 2/9 Scores 03/01/2020 03/01/2019 02/25/2018 01/01/2018 09/11/2016 07/03/2015 07/06/2014  PHQ - 2 Score 0 1 0 0 0 0 0  PHQ- 9 Score - 3 2 - - - -    Fall Risk Fall Risk  03/01/2020 03/01/2019  10/21/2018 02/25/2018 01/01/2018  Falls in the past year? 1 1 0 0 No  Number falls in past yr: 0 1 - - -  Injury with Fall? 0 0 - - -  Risk for fall due to : Impaired balance/gait History of fall(s);Impaired balance/gait;Impaired mobility - Impaired balance/gait;Impaired  mobility -  Follow up Falls evaluation completed;Education provided - - - -    Any stairs in or around the home? No  If so, are there any without handrails? No  Home free of loose throw rugs in walkways, pet beds, electrical cords, etc? Yes  Adequate lighting in your home to reduce risk of falls? Yes   ASSISTIVE DEVICES UTILIZED TO PREVENT FALLS:  Life alert? No  Use of a cane, walker or w/c? Yes  Grab bars in the bathroom? Yes  Shower chair or bench in shower? Yes  Elevated toilet seat or a handicapped toilet? Yes   TIMED UP AND GO:  Was the test performed? No .  Length of time to ambulate 10 feet: 0 sec.   Gait slow and steady with assistive device  Cognitive Function: MMSE - Mini Mental State Exam 02/25/2018 09/11/2016 07/03/2015  Not completed: - - (No Data)  Orientation to time 5 5 -  Orientation to Place 5 5 -  Registration 3 3 -  Attention/ Calculation 5 3 -  Recall 2 2 -  Language- name 2 objects 2 2 -  Language- repeat 1 1 -  Language- follow 3 step command 3 3 -  Language- read & follow direction 1 1 -  Write a sentence 1 1 -  Copy design 1 1 -  Total score 29 27 -     6CIT Screen 03/01/2020 03/01/2019  What Year? 0 points 0 points  What month? 0 points 0 points  What time? 3 points 0 points  Count back from 20 0 points 0 points  Months in reverse 0 points 0 points  Repeat phrase 4 points 0 points  Total Score 7 0    Immunizations Immunization History  Administered Date(s) Administered  . Fluad Quad(high Dose 65+) 12/24/2018  . Influenza Split 01/13/2011, 12/30/2011, 02/01/2013  . Influenza Whole 12/21/2008, 01/12/2010  . Influenza, High Dose Seasonal PF 12/09/2013, 12/11/2015,  12/24/2016, 01/01/2018  . Influenza,inj,Quad PF,6+ Mos 01/23/2015  . PFIZER SARS-COV-2 Vaccination 04/23/2019, 05/14/2019  . Pneumococcal Conjugate-13 04/21/2014  . Pneumococcal Polysaccharide-23 04/15/2018  . Pneumococcal-Unspecified 02/25/2005    TDAP status: Due, Education has been provided regarding the importance of this vaccine. Advised may receive this vaccine at local pharmacy or Health Dept. Aware to provide a copy of the vaccination record if obtained from local pharmacy or Health Dept. Verbalized acceptance and understanding. Flu Vaccine status: Up to date Pneumococcal vaccine status: Up to date Covid-19 vaccine status: Completed vaccines  Qualifies for Shingles Vaccine? Yes   Zostavax completed No   Shingrix Completed?: No.    Education has been provided regarding the importance of this vaccine. Patient has been advised to call insurance company to determine out of pocket expense if they have not yet received this vaccine. Advised may also receive vaccine at local pharmacy or Health Dept. Verbalized acceptance and understanding.  Screening Tests Health Maintenance  Topic Date Due  . TETANUS/TDAP  Never done  . FOOT EXAM  02/26/2019  . INFLUENZA VACCINE  11/13/2019  . OPHTHALMOLOGY EXAM  07/17/2020  . HEMOGLOBIN A1C  08/15/2020  . COVID-19 Vaccine  Completed  . PNA vac Low Risk Adult  Completed  . DEXA SCAN  Discontinued    Health Maintenance  Health Maintenance Due  Topic Date Due  . TETANUS/TDAP  Never done  . FOOT EXAM  02/26/2019  . INFLUENZA VACCINE  11/13/2019    Colorectal cancer screening: No longer required.  Mammogram status: No longer  required.   Lung Cancer Screening: (Low Dose CT Chest recommended if Age 49-80 years, 30 pack-year currently smoking OR have quit w/in 15years.) does not qualify.   Lung Cancer Screening Referral: no  Additional Screening:  Hepatitis C Screening: does not qualify; Completed no  Vision Screening: Recommended annual  ophthalmology exams for early detection of glaucoma and other disorders of the eye. Is the patient up to date with their annual eye exam?  Yes  Who is the provider or what is the name of the office in which the patient attends annual eye exams? Shon Hough, MD If pt is not established with a provider, would they like to be referred to a provider to establish care? No .   Dental Screening: Recommended annual dental exams for proper oral hygiene  Community Resource Referral / Chronic Care Management: CRR required this visit?  No   CCM required this visit?  No      Plan:     I have personally reviewed and noted the following in the patient's chart:   . Medical and social history . Use of alcohol, tobacco or illicit drugs  . Current medications and supplements . Functional ability and status . Nutritional status . Physical activity . Advanced directives . List of other physicians . Hospitalizations, surgeries, and ER visits in previous 12 months . Vitals . Screenings to include cognitive, depression, and falls . Referrals and appointments  In addition, I have reviewed and discussed with patient certain preventive protocols, quality metrics, and best practice recommendations. A written personalized care plan for preventive services as well as general preventive health recommendations were provided to patient.     Sheral Flow, LPN   [id number]   Nurse Notes: n/a   Medical screening examination/treatment/procedure(s) were performed by non-physician practitioner and as supervising physician I was immediately available for consultation/collaboration. I agree with above. Lew Dawes, MD

## 2020-03-01 NOTE — Patient Instructions (Signed)
Kayla Davis , Thank you for taking time to come for your Medicare Wellness Visit. I appreciate your ongoing commitment to your health goals. Please review the following plan we discussed and let me know if I can assist you in the future.   Screening recommendations/referrals: Colonoscopy: not a candidate for colon cancer screening due to age 86: not a candidate for breast cancer screening due to age Bone Density: never done Recommended yearly ophthalmology/optometry visit for glaucoma screening and checkup Recommended yearly dental visit for hygiene and checkup  Vaccinations: Influenza vaccine: 12/24/2018 Pneumococcal vaccine: up to date Tdap vaccine: never done Shingles vaccine: never done  Covid-19:up to date  Advanced directives: Please bring a copy of your health care power of attorney and living will to the office at your convenience.  Conditions/risks identified: Yes; Reviewed health maintenance screenings with patient today and relevant education, vaccines, and/or referrals were provided. Please continue to do your personal lifestyle choices by: daily care of teeth and gums, regular physical activity (goal should be 5 days a week for 30 minutes), eat a healthy diet, avoid tobacco and drug use, limiting any alcohol intake, taking a low-dose aspirin (if not allergic or have been advised by your provider otherwise) and taking vitamins and minerals as recommended by your provider. Continue doing brain stimulating activities (puzzles, reading, adult coloring books, staying active) to keep memory sharp. Continue to eat heart healthy diet (full of fruits, vegetables, whole grains, lean protein, water--limit salt, fat, and sugar intake) and increase physical activity as tolerated.  Next appointment:  Please schedule your next Medicare Wellness Visit with your Nurse Health Advisor in 1 year by calling [phone].  Preventive Care 84 Years and Older, Female Preventive care refers to  lifestyle choices and visits with your health care provider that can promote health and wellness. What does preventive care include?  A yearly physical exam. This is also called an annual well check.  Dental exams once or twice a year.  Routine eye exams. Ask your health care provider how often you should have your eyes checked.  Personal lifestyle choices, including:  Daily care of your teeth and gums.  Regular physical activity.  Eating a healthy diet.  Avoiding tobacco and drug use.  Limiting alcohol use.  Practicing safe sex.  Taking low-dose aspirin every day.  Taking vitamin and mineral supplements as recommended by your health care provider. What happens during an annual well check? The services and screenings done by your health care provider during your annual well check will depend on your age, overall health, lifestyle risk factors, and family history of disease. Counseling  Your health care provider may ask you questions about your:  Alcohol use.  Tobacco use.  Drug use.  Emotional well-being.  Home and relationship well-being.  Sexual activity.  Eating habits.  History of falls.  Memory and ability to understand (cognition).  Work and work Statistician.  Reproductive health. Screening  You may have the following tests or measurements:  Height, weight, and BMI.  Blood pressure.  Lipid and cholesterol levels. These may be checked every 5 years, or more frequently if you are over 74 years old.  Skin check.  Lung cancer screening. You may have this screening every year starting at age 84 if you have a 30-pack-year history of smoking and currently smoke or have quit within the past 15 years.  Fecal occult blood test (FOBT) of the stool. You may have this test every year starting at age 84.  Flexible  sigmoidoscopy or colonoscopy. You may have a sigmoidoscopy every 5 years or a colonoscopy every 10 years starting at age 84.  Hepatitis C blood  test.  Hepatitis B blood test.  Sexually transmitted disease (STD) testing.  Diabetes screening. This is done by checking your blood sugar (glucose) after you have not eaten for a while (fasting). You may have this done every 1-3 years.  Bone density scan. This is done to screen for osteoporosis. You may have this done starting at age 84.  Mammogram. This may be done every 1-2 years. Talk to your health care provider about how often you should have regular mammograms. Talk with your health care provider about your test results, treatment options, and if necessary, the need for more tests. Vaccines  Your health care provider may recommend certain vaccines, such as:  Influenza vaccine. This is recommended every year.  Tetanus, diphtheria, and acellular pertussis (Tdap, Td) vaccine. You may need a Td booster every 10 years.  Zoster vaccine. You may need this after age 60.  Pneumococcal 13-valent conjugate (PCV13) vaccine. One dose is recommended after age 84.  Pneumococcal polysaccharide (PPSV23) vaccine. One dose is recommended after age 84. Talk to your health care provider about which screenings and vaccines you need and how often you need them. This information is not intended to replace advice given to you by your health care provider. Make sure you discuss any questions you have with your health care provider. Document Released: 04/27/2015 Document Revised: 12/19/2015 Document Reviewed: 01/30/2015 Elsevier Interactive Patient Education  2017 [place] Prevention in the Home Falls can cause injuries. They can happen to people of all ages. There are many things you can do to make your home safe and to help prevent falls. What can I do on the outside of my home?  Regularly fix the edges of walkways and driveways and fix any cracks.  Remove anything that might make you trip as you walk through a door, such as a raised step or threshold.  Trim any bushes or trees on the  path to your home.  Use bright outdoor lighting.  Clear any walking paths of anything that might make someone trip, such as rocks or tools.  Regularly check to see if handrails are loose or broken. Make sure that both sides of any steps have handrails.  Any raised decks and porches should have guardrails on the edges.  Have any leaves, snow, or ice cleared regularly.  Use sand or salt on walking paths during winter.  Clean up any spills in your garage right away. This includes oil or grease spills. What can I do in the bathroom?  Use night lights.  Install grab bars by the toilet and in the tub and shower. Do not use towel bars as grab bars.  Use non-skid mats or decals in the tub or shower.  If you need to sit down in the shower, use a plastic, non-slip stool.  Keep the floor dry. Clean up any water that spills on the floor as soon as it happens.  Remove soap buildup in the tub or shower regularly.  Attach bath mats securely with double-sided non-slip rug tape.  Do not have throw rugs and other things on the floor that can make you trip. What can I do in the bedroom?  Use night lights.  Make sure that you have a light by your bed that is easy to reach.  Do not use any sheets or blankets that  are too big for your bed. They should not hang down onto the floor.  Have a firm chair that has side arms. You can use this for support while you get dressed.  Do not have throw rugs and other things on the floor that can make you trip. What can I do in the kitchen?  Clean up any spills right away.  Avoid walking on wet floors.  Keep items that you use a lot in easy-to-reach places.  If you need to reach something above you, use a strong step stool that has a grab bar.  Keep electrical cords out of the way.  Do not use floor polish or wax that makes floors slippery. If you must use wax, use non-skid floor wax.  Do not have throw rugs and other things on the floor that can  make you trip. What can I do with my stairs?  Do not leave any items on the stairs.  Make sure that there are handrails on both sides of the stairs and use them. Fix handrails that are broken or loose. Make sure that handrails are as long as the stairways.  Check any carpeting to make sure that it is firmly attached to the stairs. Fix any carpet that is loose or worn.  Avoid having throw rugs at the top or bottom of the stairs. If you do have throw rugs, attach them to the floor with carpet tape.  Make sure that you have a light switch at the top of the stairs and the bottom of the stairs. If you do not have them, ask someone to add them for you. What else can I do to help prevent falls?  Wear shoes that:  Do not have high heels.  Have rubber bottoms.  Are comfortable and fit you well.  Are closed at the toe. Do not wear sandals.  If you use a stepladder:  Make sure that it is fully opened. Do not climb a closed stepladder.  Make sure that both sides of the stepladder are locked into place.  Ask someone to hold it for you, if possible.  Clearly mark and make sure that you can see:  Any grab bars or handrails.  First and last steps.  Where the edge of each step is.  Use tools that help you move around (mobility aids) if they are needed. These include:  Canes.  Walkers.  Scooters.  Crutches.  Turn on the lights when you go into a dark area. Replace any light bulbs as soon as they burn out.  Set up your furniture so you have a clear path. Avoid moving your furniture around.  If any of your floors are uneven, fix them.  If there are any pets around you, be aware of where they are.  Review your medicines with your doctor. Some medicines can make you feel dizzy. This can increase your chance of falling. Ask your doctor what other things that you can do to help prevent falls. This information is not intended to replace advice given to you by your health care  provider. Make sure you discuss any questions you have with your health care provider. Document Released: 01/25/2009 Document Revised: 09/06/2015 Document Reviewed: 05/05/2014 Elsevier Interactive Patient Education  2017 [employer].

## 2020-03-05 ENCOUNTER — Telehealth: Payer: Self-pay

## 2020-03-05 ENCOUNTER — Other Ambulatory Visit: Payer: Self-pay | Admitting: Neurology

## 2020-03-05 NOTE — Telephone Encounter (Signed)
-----   Message from [name], MD sent at 03/05/2020  7:12 AM EST ----- Kayla Davis,  You probably saw my note attached to the labs.  She can stop her HCTZ, however, I do not think it will make a difference.  No need to change levothyroxine dose. Thank you, AP  ---------------------------------  dear [name], Your urine culture grew out a bacteria called Citrobacter. It is resistant to a couple of commonly used antibiotics. In the view of your multiple allergies it would be difficult to pick antibiotic to use. If you do not have any new urinary symptoms, please continue with cranberry capsules daily as before. Sincerely, AP ----- Message ----- From: Sheral Flow, LPN Sent: 40/[id number]/[id number]   9:20 AM EST To: [name], MD  Good morning,  I had the pleasure of meeting Kayla Davis yesterday afternoon with her son.  Her AWV was very lengthy because she had so many questions regarding her labs.  She wants to know should she continue taking Levothyroxine 0.1 mg or go back to 0.125 mg.  Also she wanted to leave another urine sample, I advised her that I will have to get the okay or order from you.  She stated that there is still some dysuria but no signs of hematuria. Also she needs refills on her medication to be sent to [facility] : HCTZ 12.5 mg, Losartan 50 mg, Metoprolol, Pregablin 200 mg, Levithyroxine 0.1 mg. And also send ASA 81 mg to [facility].  Kayla Davis

## 2020-03-12 MED ORDER — PREGABALIN 200 MG PO CAPS: 200.0000 mg | ORAL_CAPSULE | Freq: Every day | ORAL | 1 refills | Status: DC

## 2020-03-15 MED ORDER — PREGABALIN 200 MG PO CAPS: 200.0000 mg | ORAL_CAPSULE | Freq: Every day | ORAL | 1 refills | Status: DC

## 2020-03-15 NOTE — Addendum Note (Signed)
Addended by: [name] on: 03/15/2020 09:19 AM   Modules accepted: Orders

## 2020-03-16 ENCOUNTER — Other Ambulatory Visit: Payer: Self-pay

## 2020-03-16 ENCOUNTER — Telehealth: Payer: Self-pay

## 2020-03-16 ENCOUNTER — Emergency Department (HOSPITAL_COMMUNITY): Payer: MEDICARE

## 2020-03-16 ENCOUNTER — Emergency Department (HOSPITAL_COMMUNITY)
Admission: EM | Admit: 2020-03-16 | Discharge: 2020-03-17 | Disposition: A | Discharge status: Home / Self Care | Payer: MEDICARE | Attending: Emergency Medicine | Admitting: Emergency Medicine

## 2020-03-16 DIAGNOSIS — I10 Essential (primary) hypertension: Secondary | ICD-10-CM | POA: Insufficient documentation

## 2020-03-16 DIAGNOSIS — Z96642 Presence of left artificial hip joint: Secondary | ICD-10-CM | POA: Insufficient documentation

## 2020-03-16 DIAGNOSIS — K5730 Diverticulosis of large intestine without perforation or abscess without bleeding: Secondary | ICD-10-CM | POA: Diagnosis not present

## 2020-03-16 DIAGNOSIS — Z79899 Other long term (current) drug therapy: Secondary | ICD-10-CM | POA: Insufficient documentation

## 2020-03-16 DIAGNOSIS — E039 Hypothyroidism, unspecified: Secondary | ICD-10-CM | POA: Diagnosis not present

## 2020-03-16 DIAGNOSIS — M25551 Pain in right hip: Secondary | ICD-10-CM | POA: Diagnosis not present

## 2020-03-16 DIAGNOSIS — K429 Umbilical hernia without obstruction or gangrene: Secondary | ICD-10-CM | POA: Diagnosis not present

## 2020-03-16 DIAGNOSIS — Z950 Presence of cardiac pacemaker: Secondary | ICD-10-CM | POA: Insufficient documentation

## 2020-03-16 DIAGNOSIS — E119 Type 2 diabetes mellitus without complications: Secondary | ICD-10-CM | POA: Insufficient documentation

## 2020-03-16 DIAGNOSIS — Z7982 Long term (current) use of aspirin: Secondary | ICD-10-CM | POA: Diagnosis not present

## 2020-03-16 DIAGNOSIS — M62838 Other muscle spasm: Secondary | ICD-10-CM

## 2020-03-16 DIAGNOSIS — M47816 Spondylosis without myelopathy or radiculopathy, lumbar region: Secondary | ICD-10-CM | POA: Diagnosis not present

## 2020-03-16 MED ORDER — ACETAMINOPHEN 325 MG PO TABS
650.0000 mg | ORAL_TABLET | Freq: Once | ORAL | Status: AC
  Administered 2020-03-17: 650 mg via ORAL
  Filled 2020-03-16: qty 2

## 2020-03-16 MED ORDER — LIDOCAINE 5 % EX PTCH
1.0000 | MEDICATED_PATCH | CUTANEOUS | Status: DC
  Administered 2020-03-17: 1 via TRANSDERMAL
  Filled 2020-03-16: qty 1

## 2020-03-16 MED ORDER — TIZANIDINE HCL 4 MG PO TABS
2.0000 mg | ORAL_TABLET | Freq: Once | ORAL | Status: AC
  Administered 2020-03-17: 2 mg via ORAL
  Filled 2020-03-16: qty 1

## 2020-03-16 NOTE — ED Notes (Signed)
Kayla Davis, son, [phone] would like an update when available

## 2020-03-16 NOTE — Telephone Encounter (Signed)
Called [facility] and canceled rx of Lyrica.

## 2020-03-16 NOTE — ED Triage Notes (Signed)
Pt reports R hip pain without injury x 2 days. Sts she has not fallen in over two weeks. Sts unable to bear weight on R leg.

## 2020-03-17 DIAGNOSIS — M62838 Other muscle spasm: Secondary | ICD-10-CM | POA: Diagnosis not present

## 2020-03-17 MED ORDER — IBUPROFEN 400 MG PO TABS: 400.0000 mg | ORAL_TABLET | Freq: Three times a day (TID) | ORAL | 0 refills | Status: AC | PRN

## 2020-03-17 MED ORDER — TIZANIDINE HCL 2 MG PO TABS: 4.0000 mg | ORAL_TABLET | Freq: Three times a day (TID) | ORAL | 0 refills | Status: DC

## 2020-03-17 MED ORDER — LIDOCAINE 4 % EX PTCH: 1.0000 | MEDICATED_PATCH | Freq: Two times a day (BID) | CUTANEOUS | 0 refills | Status: DC

## 2020-03-17 NOTE — ED Provider Notes (Signed)
[facility] [facility] EMERGENCY DEPARTMENT Provider Note   CSN: [id number] Arrival date & time: 03/16/20  1351     History Chief Complaint  Patient presents with  . Hip Pain    Kayla Davis is a 86 y.o. female.  HPI     86 year old female comes with a chief complaint of hip pain.  Patient has history of diabetes.  She reports that for the last 2 or 3 days she has been having some discomfort over the right hip.  Yesterday the pain started intensifying.  Today patient could not get out of the bed without hollering, therefore she came to the ER.  She denies any recent falls.  She did have a fall 2 or 3 weeks ago and she injured her tailbone.  No numbness, tingling.  Past Medical History:  Diagnosis Date  . Anemia, unspecified   . Anxiety state, unspecified   . Aortic valve disorders   . Bradycardia    s/p PPM  . Bursitis   . Complication of anesthesia    has had difficulty awakening in past; pt states did well with the anesthesia given during her carotid surgery   . Diabetes mellitus without complication ([place])   . Diverticulosis of colon (without mention of hemorrhage)   . Herpes zoster with other nervous system complications(053.19)   . Hyperlipidemia   . Hypertension   . Hypothyroidism   . Irritable bowel syndrome   . Osteoarthrosis, unspecified whether generalized or localized, unspecified site   . Peripheral vascular disease ([place])   . Squamous cell carcinoma of skin 04/21/2012   left sideburn - tx p bx  . Squamous cell carcinoma of skin 01/10/2013   recurrent on left sideburn - tx p bx  . Squamous cell carcinoma of skin 08/22/2013   left sideburn - tx p bx  . TIA (transient ischemic attack)   . Unspecified hearing loss   . Varicose veins     Patient Active Problem List   Diagnosis Date Noted  . Gout attack 03/07/2019  . Otitis externa 03/07/2019  . Hematuria 11/20/2017  . Cough 11/20/2017  . Colon cancer screening 08/04/2017  . Abdominal pain  03/13/2017  . Amaurosis 07/03/2016  . Diet-controlled type 2 diabetes mellitus ([place]) 02/29/2016  . Vertigo 12/11/2015  . Gait disorder 06/27/2015  . OA (osteoarthritis) of hip 03/23/2015  . Preop exam for internal medicine 03/04/2015  . Shortness of breath 02/13/2015  . Rhinitis, atrophic 11/22/2014  . Well adult exam 04/21/2014  . Amaurosis fugax 03/31/2014  . History of CEA (carotid endarterectomy) 03/31/2014  . HLD (hyperlipidemia) 03/31/2014  . Carotid stenosis 02/07/2014  . Cerebral infarction ([place]) 01/16/2014  . Dyslipidemia 01/16/2014  . Weakness generalized 04/30/2013  . CAP (community acquired pneumonia) 04/29/2013  . Edema 03/22/2013  . Urinary incontinence in female 03/22/2013  . Constipation 10/01/2012  . UTI (urinary tract infection) 10/01/2012  . Diarrhea 08/26/2012  . Cerumen impaction 12/11/2011  . Pacemaker-Medtronic 07/30/2010  . SINUS BRADYCARDIA 05/17/2010  . DYSURIA 01/29/2010  . Aortic valve disorders 07/31/2009  . Venous (peripheral) insufficiency 07/31/2009  . Hyponatremia 07/01/2009  . LEG CRAMPS 06/12/2009  . LEG PAIN, BILATERAL 02/01/2009  . Backache 08/24/2008  . Anemia 06/21/2008  . DEGENERATIVE JOINT DISEASE 02/19/2008  . CHEST WALL PAIN, HX OF 02/19/2008  . IRRITABLE BOWEL SYNDROME 12/28/2007  . DIABETES MELLITUS, BORDERLINE 11/06/2007  . GASTRITIS 07/08/2007  . POSTHERPETIC NEURALGIA 07/01/2007  . Herpes zoster without mention of complication 51/76/1607  . Hypothyroidism  06/08/2007  . HYPERLIPIDEMIA 06/08/2007  . Anxiety state 06/08/2007  . Hereditary and idiopathic peripheral neuropathy 06/08/2007  . HEARING LOSS 06/08/2007  . Essential hypertension 06/08/2007  . Transient cerebral ischemia 06/08/2007  . BRONCHITIS, RECURRENT 06/08/2007  . DIVERTICULOSIS OF COLON 06/08/2007  . Osteoporosis 06/08/2007    Past Surgical History:  Procedure Laterality Date  . APPENDECTOMY    . CATARACT EXTRACTION, BILATERAL  end of June 2013  .  CHOLECYSTECTOMY    . DILATION AND CURETTAGE OF UTERUS    . ENDARTERECTOMY Right 01/20/2014   Procedure: RIGHT CAROTID ARTERY ENDARTERECTOMY WITH DACRON PATCH ANGIOPLASTY;  Surgeon: Mal Misty, MD;  Location: [facility];  Service: Vascular;  Laterality: Right;  . PACEMAKER INSERTION     MDT dual chamber PPM implanted by Dr [name] for sinus node dysfunction   . TONSILLECTOMY    . TOTAL HIP ARTHROPLASTY Left 03/23/2015   Procedure: TOTAL HIP ARTHROPLASTY ANTERIOR APPROACH;  Surgeon: Gaynelle Arabian, MD;  Location: [facility];  Service: Orthopedics;  Laterality: Left;  [name] VAGINAL HYSTERECTOMY       OB History   No obstetric history on file.     Family History  Problem Relation Age of Onset  . Heart disease Mother   . Heart disease Father   . Diabetes Father   . Cancer Paternal Grandmother     Social History   Tobacco Use  . Smoking status: Never Smoker  . Smokeless tobacco: Never Used  Vaping Use  . Vaping Use: Never used  Substance Use Topics  . Alcohol use: No    Alcohol/week: 0.0 standard drinks  . Drug use: No    Home Medications Prior to Admission medications   Medication Sig Start Date End Date Taking? Authorizing Provider  cetirizine (ZYRTEC) 10 MG tablet Take 10 mg by mouth at bedtime.   Yes [provider]  pregabalin (LYRICA) 200 MG capsule Take 1 capsule (200 mg total) by mouth at bedtime. 03/15/20  Yes Patel, Donika K, DO  ALPHA LIPOIC ACID PO Take 1 tablet by mouth daily.    [provider]  aspirin 81 MG EC tablet TAKE 1 TABLET BY MOUTH EVERY DAY 12/21/19   [name], Jeneen Rinks, MD  BAYER LOW DOSE 81 MG EC tablet Take 81 mg by mouth daily. 03/06/20   [provider]  Coenzyme Q10 (CO Q 10 PO) Take 1 tablet by mouth daily.    [provider]  CRANBERRY PO Take 3 tablets by mouth daily with supper.     [provider]  Cyanocobalamin (VITAMIN B-12 PO) Take 1 tablet by mouth at bedtime.    [provider]  Docusate Calcium  (STOOL SOFTENER PO) Take 1 capsule by mouth at bedtime.     [provider]  EVENING PRIMROSE OIL PO Take 1 capsule by mouth daily.    [provider]  glucose blood (ONE TOUCH ULTRA TEST) test strip TEST ONCE DAILY AS DIRECTED DX E11.9 08/10/18   Plotnikov, Evie Lacks, MD  hydrochlorothiazide (MICROZIDE) 12.5 MG capsule Take 1 capsule (12.5 mg total) by mouth daily. 08/04/19   Plotnikov, Evie Lacks, MD  ibuprofen (ADVIL) 400 MG tablet Take 1 tablet (400 mg total) by mouth every 8 (eight) hours as needed for up to 5 days for moderate pain. 03/17/20 03/22/20  Varney Biles, MD  ipratropium (ATROVENT) 0.06 % nasal spray Place 2 sprays into both nostrils 3 (three) times daily. Patient taking differently: Place 2 sprays into both nostrils in the  morning and at bedtime.  03/09/19   Plotnikov, Evie Lacks, MD  Lancets [facility] ULTRASOFT) lancets Use to check blood sugars once a day Dx E11.9 08/19/18   Plotnikov, Evie Lacks, MD  levothyroxine (SYNTHROID) 100 MCG tablet Take 1 tablet (100 mcg total) by mouth daily before breakfast. 11/10/19   Plotnikov, Evie Lacks, MD  Lidocaine 4 % PTCH Apply 1 patch topically 2 (two) times daily. 03/17/20   Varney Biles, MD  losartan (COZAAR) 50 MG tablet Take 1 tablet (50 mg total) by mouth daily. 11/10/19   Plotnikov, Evie Lacks, MD  metoprolol tartrate (LOPRESSOR) 25 MG tablet TAKE 1 TABLET BY MOUTH TWICE A DAY 09/14/19   Plotnikov, Evie Lacks, MD  Multiple Vitamins-Minerals (EYE VITAMINS) CAPS Take 1 capsule by mouth daily.    [provider]  Polyethyl [name] (SYSTANE OP) Apply to eye daily.    [provider]  polyethylene glycol (MIRALAX / GLYCOLAX) packet Take 17 g by mouth daily as needed for mild constipation.    [provider]  simvastatin (ZOCOR) 40 MG tablet TAKE 1 TABLET DAILY 11/10/19   Plotnikov, Evie Lacks, MD  tiZANidine (ZANAFLEX) 2 MG tablet Take 2 tablets (4 mg total) by mouth 3 (three) times daily. 03/17/20    Varney Biles, MD    Allergies    Penicillins, Alprazolam, Bactrim [sulfamethoxazole-trimethoprim], Ceclor [cefaclor], Cephalexin, Ciprofloxacin, Clindamycin/lincomycin, Hydrocodone, Noroxin [norfloxacin], Sulfonamide derivatives, Tetracyclines & related, and Tramadol  Review of Systems   Review of Systems  Constitutional: Positive for activity change.  Respiratory: Negative for shortness of breath.   Cardiovascular: Negative for chest pain.  Gastrointestinal: Negative for nausea and vomiting.  Musculoskeletal: Positive for back pain.  All other systems reviewed and are negative.   Physical Exam Updated Vital Signs BP (!) 150/79   Pulse 81   Temp 97.7 F (36.5 C) (Oral)   Resp 14   LMP  (LMP Unknown)   SpO2 96%   Physical Exam Vitals and nursing note reviewed.  Constitutional:      Appearance: She is well-developed.  HENT:     Head: Normocephalic and atraumatic.  Cardiovascular:     Rate and Rhythm: Normal rate.  Pulmonary:     Effort: Pulmonary effort is normal.  Abdominal:     General: Bowel sounds are normal.  Musculoskeletal:        General: Tenderness present. No swelling or deformity.     Cervical back: Normal range of motion and neck supple.     Comments: Patient is noted to have large spasm over the right pelvis, above the right hip.  Patient has reproducible tenderness with palpation.  Lower extremity strength is 4+ out of 5 bilaterally.  Skin:    General: Skin is warm and dry.  Neurological:     Mental Status: She is alert and oriented to person, place, and time.     ED Results / Procedures / Treatments   Labs (all labs ordered are listed, but only abnormal results are displayed) Labs Reviewed - No data to display  EKG None  Radiology CT PELVIS WO CONTRAST  Result Date: 03/16/2020 CLINICAL DATA:  Right hip pain for 2 days, no history of injury EXAM: CT PELVIS WITHOUT CONTRAST TECHNIQUE: Multidetector CT imaging of the pelvis was performed  following the standard protocol without intravenous contrast. COMPARISON:  03/16/2020, 10/22/2019 FINDINGS: Urinary Tract:  No abnormality visualized. Bowel: Sigmoid diverticulosis without diverticulitis. No bowel obstruction. Vascular/Lymphatic: Diffuse atherosclerosis. No pathologic adenopathy. Reproductive:  No mass or  other significant abnormality Other: No free fluid or free gas. Small fat containing umbilical hernia. Musculoskeletal: There are no acute displaced fractures. Left hip arthroplasty is unremarkable. Stable degenerative changes of the lower lumbar spine. Reconstructed images demonstrate no additional findings. IMPRESSION: 1. No acute displaced fracture. 2. Diverticulosis without diverticulitis. 3.  Aortic Atherosclerosis (ICD10-I70.0). Electronically Signed   By: Randa Ngo M.D.   On: 03/16/2020 18:19   DG Hip Unilat W or Wo Pelvis 2-3 Views Right  Result Date: 03/16/2020 CLINICAL DATA:  Right hip pain after a fall. EXAM: DG HIP (WITH OR WITHOUT PELVIS) 2-3V RIGHT COMPARISON:  None. FINDINGS: Bones are diffusely demineralized. Status post left hip replacement. SI joints and symphysis pubis unremarkable. AP and frog-leg lateral views of the right hip show no evidence for femoral neck fracture. IMPRESSION: No evidence for acute fracture of the right femoral neck. If symptoms persist or worsen, CT or MR imaging could be used to further evaluate given osteopenia in this patient. Electronically Signed   By: Misty Stanley M.D.   On: 03/16/2020 15:16    Procedures Procedures (including critical care time)  Medications Ordered in ED Medications  lidocaine (LIDODERM) 5 % 1 patch (1 patch Transdermal Patch Applied 03/17/20 0018)  acetaminophen (TYLENOL) tablet 650 mg (650 mg Oral Given 03/17/20 0018)  tiZANidine (ZANAFLEX) tablet 2 mg (2 mg Oral Given 03/17/20 0018)    ED Course  I have reviewed the triage vital signs and the nursing notes.  Pertinent labs & imaging results that were  available during my care of the patient were reviewed by me and considered in my medical decision making (see chart for details).    MDM Rules/Calculators/A&P                          Patient comes in a chief complaint of hip pain.  She reports today of severe hip pain, worse with walking.  At rest she has no pain.  On exam she is noted to have large spasm.  CT scan was done to rule out occult fracture and it was negative.  Patient was able to ambulate with discomfort in the ED.  She is able to bear weight.  I spoke with patient's son, who is in agreement with the plan of patient going home with home health and PT.  She will be started on Zanaflex and ibuprofen.  Advised extreme precautions to prevent any falls at home.  Strict return precautions discussed with  Final Clinical Impression(s) / ED Diagnoses Final diagnoses:  Muscle spasticity    Rx / DC Orders ED Discharge Orders         [place]        03/17/20 0029    Face-to-face encounter (required for Medicare/Medicaid patients)       Comments: I Nohely Whitehorn certify that this patient is under my care and that I, or a nurse practitioner or physician's assistant working with me, had a face-to-face encounter that meets the physician face-to-face encounter requirements with this patient on 03/17/2020. The encounter with the patient was in whole, or in part for the following medical condition(s) which is the primary reason for home health care (List medical condition): recent falls with severe pain that is acute on the R side. Noted to have normal imaging.   03/17/20 0029    Lidocaine 4 % PTCH  2 times daily        03/17/20  0030    ibuprofen (ADVIL) 400 MG tablet  Every 8 hours PRN        03/17/20 0030    tiZANidine (ZANAFLEX) 2 MG tablet  3 times daily        03/17/20 0030           Varney Biles, MD 03/17/20 [phone]

## 2020-03-17 NOTE — ED Notes (Signed)
Patient able to bear weight and stand on R hip, was able to take a few steps into bathroom with assistance.

## 2020-03-17 NOTE — Discharge Instructions (Signed)
We saw the ER with significant hip pain.  The CT scan of your hip does not reveal any evidence of fracture.  We have noticed large spasm over the right hip, which we suspect is the underlying cause for your worsening pain.  We have ordered home physical therapy, nursing and social work to help assist you further. Take the medications prescribed for symptom control Please ensure you do not have another fall at any cost. Return to the ER if your pain is severe and you feel unsafe walking at home.

## 2020-03-21 ENCOUNTER — Telehealth: Payer: Self-pay | Admitting: Internal Medicine

## 2020-03-21 NOTE — Telephone Encounter (Signed)
° ° °  Patient calling to request form for handicap placard be completed and mailed to her

## 2020-03-22 NOTE — Telephone Encounter (Signed)
Completed handicap form place on MD desk to sign.Marland KitchenJohny Chess

## 2020-03-23 NOTE — Telephone Encounter (Signed)
OK. Thx

## 2020-03-26 NOTE — Telephone Encounter (Signed)
MD sign mil to pt address on file.Marland KitchenJohny Chess

## 2020-04-18 ENCOUNTER — Ambulatory Visit (INDEPENDENT_AMBULATORY_CARE_PROVIDER_SITE_OTHER): Payer: MEDICARE

## 2020-04-18 ENCOUNTER — Other Ambulatory Visit: Payer: Self-pay

## 2020-04-18 DIAGNOSIS — R55 Syncope and collapse: Secondary | ICD-10-CM

## 2020-04-19 LAB — CUP PACEART REMOTE DEVICE CHECK
Battery Impedance: 1652 Ohm
Battery Remaining Longevity: 41 mo
Battery Voltage: 2.76 V
Brady Statistic AP VP Percent: 7 %
Brady Statistic AP VS Percent: 90 %
Brady Statistic AS VP Percent: 2 %
Brady Statistic AS VS Percent: 1 %
Date Time Interrogation Session: 20220105174533
Implantable Lead Implant Date: 20111031
Implantable Lead Implant Date: 20111031
Implantable Lead Location: 753859
Implantable Lead Location: 753860
Implantable Lead Model: 4469
Implantable Lead Model: 4470
Implantable Lead Serial Number: 548557
Implantable Lead Serial Number: 686076
Implantable Pulse Generator Implant Date: 20111031
Lead Channel Impedance Value: 461 Ohm
Lead Channel Impedance Value: 520 Ohm
Lead Channel Pacing Threshold Amplitude: 0.625 V
Lead Channel Pacing Threshold Amplitude: 0.875 V
Lead Channel Pacing Threshold Pulse Width: 0.4 ms
Lead Channel Pacing Threshold Pulse Width: 0.4 ms
Lead Channel Setting Pacing Amplitude: 2 V
Lead Channel Setting Pacing Amplitude: 2.5 V
Lead Channel Setting Pacing Pulse Width: 0.4 ms
Lead Channel Setting Sensing Sensitivity: 5.6 mV

## 2020-04-23 DIAGNOSIS — M5459 Other low back pain: Secondary | ICD-10-CM | POA: Diagnosis not present

## 2020-04-23 DIAGNOSIS — M545 Low back pain, unspecified: Secondary | ICD-10-CM | POA: Diagnosis not present

## 2020-04-23 DIAGNOSIS — M25552 Pain in left hip: Secondary | ICD-10-CM | POA: Diagnosis not present

## 2020-04-24 ENCOUNTER — Other Ambulatory Visit: Payer: Self-pay | Admitting: Student

## 2020-04-24 DIAGNOSIS — M545 Low back pain, unspecified: Secondary | ICD-10-CM

## 2020-04-25 ENCOUNTER — Ambulatory Visit
Admission: RE | Admit: 2020-04-25 | Discharge: 2020-04-25 | Disposition: A | Discharge status: Home / Self Care | Payer: MEDICARE | Source: Ambulatory Visit | Attending: Student | Admitting: Student

## 2020-04-25 DIAGNOSIS — M5126 Other intervertebral disc displacement, lumbar region: Secondary | ICD-10-CM | POA: Diagnosis not present

## 2020-04-25 DIAGNOSIS — M48061 Spinal stenosis, lumbar region without neurogenic claudication: Secondary | ICD-10-CM | POA: Diagnosis not present

## 2020-04-25 DIAGNOSIS — M545 Low back pain, unspecified: Secondary | ICD-10-CM

## 2020-04-25 DIAGNOSIS — M5136 Other intervertebral disc degeneration, lumbar region: Secondary | ICD-10-CM | POA: Diagnosis not present

## 2020-05-02 NOTE — Progress Notes (Signed)
Remote pacemaker transmission.   

## 2020-05-10 ENCOUNTER — Telehealth: Payer: Self-pay | Admitting: Internal Medicine

## 2020-05-10 NOTE — Telephone Encounter (Signed)
Spoke with pt who reports mild DOE over the last 2 months.  Pt reports no recent cough or fever.  She does report "coughing spells," runny nose and thinks she is "working on getting a bad cold"  Pt denies current CP or edema.  She continues to do her chair exercises without difficulty.  Pt does not sound SOB on the phone.  Pt scheduled to see Dr [name] on 06/01/2020 for routine f/u.  Pt advised to continue current medications, monitor BP and call for increasing or additional symptoms.  Reviewed ED precautions with pt and advised her to contact her PCP with "cold symptoms" she feels she is experiencing.  Pt verbalizes understanding and agrees with current plan.

## 2020-05-10 NOTE — Telephone Encounter (Signed)
Pt c/o Shortness Of Breath: STAT if SOB developed within the last 24 hours or pt is noticeably SOB on the phone  1. Are you currently SOB (can you hear that pt is SOB on the phone)? Yes and I hear the patient  2. How long have you been experiencing SOB? Awhile   3. Are you SOB when sitting or when up moving around? Getting and moving   4. Are you currently experiencing any other symptoms? No

## 2020-05-16 ENCOUNTER — Other Ambulatory Visit: Payer: Self-pay | Admitting: Internal Medicine

## 2020-05-16 MED ORDER — IPRATROPIUM BROMIDE 0.06 % NA SOLN
2.0000 | Freq: Three times a day (TID) | NASAL | 3 refills | Status: DC
Start: 2020-05-16 — End: 2021-04-19

## 2020-05-16 MED ORDER — ESTRADIOL 0.1 MG/GM VA CREA: TOPICAL_CREAM | VAGINAL | 3 refills | Status: DC

## 2020-06-01 ENCOUNTER — Ambulatory Visit (INDEPENDENT_AMBULATORY_CARE_PROVIDER_SITE_OTHER): Payer: MEDICARE | Admitting: Internal Medicine

## 2020-06-01 ENCOUNTER — Other Ambulatory Visit: Payer: Self-pay

## 2020-06-01 ENCOUNTER — Encounter: Payer: Self-pay | Admitting: Internal Medicine

## 2020-06-01 VITALS — BP 138/62 | HR 65 | Ht 65.0 in | Wt 157.4 lb

## 2020-06-01 DIAGNOSIS — I10 Essential (primary) hypertension: Secondary | ICD-10-CM | POA: Diagnosis not present

## 2020-06-01 DIAGNOSIS — I5032 Chronic diastolic (congestive) heart failure: Secondary | ICD-10-CM | POA: Diagnosis not present

## 2020-06-01 DIAGNOSIS — I495 Sick sinus syndrome: Secondary | ICD-10-CM

## 2020-06-01 MED ORDER — FUROSEMIDE 20 MG PO TABS: 20.0000 mg | ORAL_TABLET | Freq: Every day | ORAL | 3 refills | Status: DC

## 2020-06-01 NOTE — Patient Instructions (Addendum)
Medication Instructions:  Stop your Hydrochlorothiazide Start Lasix 20 mg daily  Your physician recommends that you continue on your current medications as directed. Please refer to the Current Medication list given to you today.  Labwork: BNP, BMP  Testing/Procedures: please schedule ECHO  Your physician has requested that you have an echocardiogram. Echocardiography is a painless test that uses sound waves to create images of your heart. It provides your doctor with information about the size and shape of your heart and how well your heart's chambers and valves are working. This procedure takes approximately one hour. There are no restrictions for this procedure.   Follow-Up:  Your physician wants you to follow-up in: 6 weeks with   [name] "Kayla Davis" Chalmers Cater, PA-C    Remote monitoring is used to monitor your Pacemaker from home. This monitoring reduces the number of office visits required to check your device to one time per year. It allows [place] to keep an eye on the functioning of your device to ensure it is working properly. You are scheduled for a device check from home on 07/18/20. You may send your transmission at any time that day. If you have a wireless device, the transmission will be sent automatically. After your physician reviews your transmission, you will receive a postcard with your next transmission date.  Any Other Special Instructions Will Be Listed Below (If Applicable).  If you need a refill on your cardiac medications before your next appointment, please call your pharmacy.

## 2020-06-01 NOTE — Progress Notes (Signed)
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  Since last being seen in our clinic, the patient reports doing reasonably well.  She has had chronic SOB but feels that this is recently worse.  He son (caregiver) reports that she is SOB just walking to the bathroom at home.  Today, she denies symptoms of palpitations, chest pain,  lower extremity edema, dizziness, presyncope, or syncope.  The patient is otherwise without complaint today.   Past Medical History:  Diagnosis Date  . Anemia, unspecified   . Anxiety state, unspecified   . Aortic valve disorders   . Bradycardia    s/p PPM  . Bursitis   . Complication of anesthesia    has had difficulty awakening in past; pt states did well with the anesthesia given during her carotid surgery   . Diabetes mellitus without complication ([place])   . Diverticulosis of colon (without mention of hemorrhage)   . Herpes zoster with other nervous system complications(053.19)   . Hyperlipidemia   . Hypertension   . Hypothyroidism   . Irritable bowel syndrome   . Osteoarthrosis, unspecified whether generalized or localized, unspecified site   . Peripheral vascular disease ([place])   . Squamous cell carcinoma of skin 04/21/2012   left sideburn - tx p bx  . Squamous cell carcinoma of skin 01/10/2013   recurrent on left sideburn - tx p bx  . Squamous cell carcinoma of skin 08/22/2013   left sideburn - tx p bx  . TIA (transient ischemic attack)   . Unspecified hearing loss   . Varicose veins    Past Surgical History:  Procedure Laterality Date  . APPENDECTOMY    . CATARACT EXTRACTION, BILATERAL  end of June 2013  . CHOLECYSTECTOMY    . DILATION AND CURETTAGE OF UTERUS    . ENDARTERECTOMY Right 01/20/2014   Procedure: RIGHT CAROTID ARTERY ENDARTERECTOMY WITH DACRON PATCH ANGIOPLASTY;  Surgeon: Mal Misty, MD;  Location: [facility];  Service:  Vascular;  Laterality: Right;  . PACEMAKER INSERTION     MDT dual chamber PPM implanted by Dr [name] for sinus node dysfunction   . TONSILLECTOMY    . TOTAL HIP ARTHROPLASTY Left 03/23/2015   Procedure: TOTAL HIP ARTHROPLASTY ANTERIOR APPROACH;  Surgeon: Gaynelle Arabian, MD;  Location: [facility];  Service: Orthopedics;  Laterality: Left;  [name] VAGINAL HYSTERECTOMY      ROS- all systems are reviewed and negative except as per HPI above  Current Outpatient Medications  Medication Sig Dispense Refill  . ALPHA LIPOIC ACID PO Take 1 tablet by mouth daily.    [name] aspirin 81 MG EC tablet TAKE 1 TABLET BY MOUTH EVERY DAY 90 tablet 3  . BAYER LOW DOSE 81 MG EC tablet Take 81 mg by mouth daily.    . cetirizine (ZYRTEC) 10 MG tablet Take 10 mg by mouth at bedtime.    . Coenzyme Q10 (CO Q 10 PO) Take 1 tablet by mouth daily.    [name] CRANBERRY PO Take 3 tablets by mouth daily with supper.     . Cyanocobalamin (VITAMIN B-12 PO) Take 1 tablet by mouth at bedtime.    [name] Calcium (STOOL SOFTENER PO) Take 1 capsule by mouth at bedtime.     [name] estradiol (ESTRACE) 0.1 MG/GM vaginal cream PV as directed 42.5 g 3  . EVENING PRIMROSE OIL PO Take 1  capsule by mouth daily.    [name] glucose blood (ONE TOUCH ULTRA TEST) test strip TEST ONCE DAILY AS DIRECTED DX E11.9 100 each 5  . hydrochlorothiazide (MICROZIDE) 12.5 MG capsule Take 1 capsule (12.5 mg total) by mouth daily. 90 capsule 3  . ipratropium (ATROVENT) 0.06 % nasal spray Place 2 sprays into both nostrils 3 (three) times daily. 45 mL 3  . Lancets (ONETOUCH ULTRASOFT) lancets Use to check blood sugars once a day Dx E11.9 100 each 3  . levothyroxine (SYNTHROID) 100 MCG tablet Take 1 tablet (100 mcg total) by mouth daily before breakfast. 90 tablet 3  . Lidocaine 4 % PTCH Apply 1 patch topically 2 (two) times daily. 12 patch 0  . losartan (COZAAR) 50 MG tablet Take 1 tablet (50 mg total) by mouth daily. 90 tablet 3  . metoprolol tartrate (LOPRESSOR) 25 MG tablet TAKE 1  TABLET BY MOUTH TWICE A DAY 180 tablet 3  . Multiple Vitamins-Minerals (EYE VITAMINS) CAPS Take 1 capsule by mouth daily.    [name] (SYSTANE OP) Apply to eye daily.    . polyethylene glycol (MIRALAX / GLYCOLAX) packet Take 17 g by mouth daily as needed for mild constipation.    . pregabalin (LYRICA) 200 MG capsule Take 1 capsule (200 mg total) by mouth at bedtime. 90 capsule 1  . simvastatin (ZOCOR) 40 MG tablet TAKE 1 TABLET DAILY 90 tablet 3  . tiZANidine (ZANAFLEX) 2 MG tablet Take 2 tablets (4 mg total) by mouth 3 (three) times daily. 12 tablet 0   No current facility-administered medications for this visit.    Physical Exam: Vitals:   06/01/20 1543  BP: 138/62  Pulse: 65  SpO2: 94%  Weight: 157 lb 6.4 oz (71.4 kg)  Height: 5\' 5"  (1.651 m)    GEN- The patient is elderly appearing, alert and oriented x 3 today.   Head- normocephalic, atraumatic Eyes-  Sclera clear, conjunctiva pink Ears- hearing intact Oropharynx- clear  neck + JVD Lungs- bibasilar rales,  normal work of breathing Chest- pacemaker pocket is well healed Heart- Regular rate and rhythm, 2/6 SEM LUSB GI- soft, NT, ND, + BS Extremities- no clubbing, cyanosis, or edema  Pacemaker interrogation- reviewed in detail today,  See PACEART report  ekg tracing ordered today is personally reviewed and shows atrial paced, PR 380 msec, LBBB  Echo 2018 reviewed My prior notes and EP APP notes reviewed  Assessment and Plan:  1. Symptomatic sinus bradycardia  Normal pacemaker function See Pace Art report MVP is turned off today due to AV delays of 380 msec she is not device dependant today  2. HTN Stop hctz and start lasix  (see below)  3. Acute on chronic diastolic dysfunction Recently worsened SOB Volume overloaded on exam Stop hctz Start lasix 20mg   Daily Bmet, BNP  Return to see EP PA in 6 weeks  Care given with assistance of her son who is caregiver.  Thompson Grayer MD,  [facility] 06/01/2020 3:43 PM

## 2020-06-02 LAB — BASIC METABOLIC PANEL
BUN/Creatinine Ratio: 27 (ref 12–28)
BUN: 25 mg/dL (ref 10–36)
CO2: 23 mmol/L (ref 20–29)
Calcium: 8.9 mg/dL (ref 8.7–10.3)
Chloride: 99 mmol/L (ref 96–106)
Creatinine, Ser: 0.92 mg/dL (ref 0.57–1.00)
GFR calc Af Amer: 61 mL/min/{1.73_m2} (ref >59)
GFR calc non Af Amer: 53 mL/min/{1.73_m2} — ABNORMAL LOW (ref >59)
Glucose: 159 mg/dL — ABNORMAL HIGH (ref 65–99)
Potassium: 4.3 mmol/L (ref 3.5–5.2)
Sodium: 137 mmol/L (ref 134–144)

## 2020-06-02 LAB — PRO B NATRIURETIC PEPTIDE: NT-Pro BNP: 737 pg/mL (ref 0–738)

## 2020-06-02 LAB — SPECIMEN STATUS REPORT

## 2020-06-05 ENCOUNTER — Other Ambulatory Visit: Payer: Self-pay | Admitting: Internal Medicine

## 2020-06-25 ENCOUNTER — Ambulatory Visit (HOSPITAL_COMMUNITY): Payer: MEDICARE | Attending: Internal Medicine

## 2020-06-25 ENCOUNTER — Other Ambulatory Visit: Payer: Self-pay

## 2020-06-25 DIAGNOSIS — I5032 Chronic diastolic (congestive) heart failure: Secondary | ICD-10-CM | POA: Diagnosis not present

## 2020-06-25 DIAGNOSIS — I10 Essential (primary) hypertension: Secondary | ICD-10-CM | POA: Insufficient documentation

## 2020-06-25 DIAGNOSIS — I495 Sick sinus syndrome: Secondary | ICD-10-CM | POA: Insufficient documentation

## 2020-06-25 LAB — ECHOCARDIOGRAM COMPLETE
AR max vel: 0.94 cm2
AV Area VTI: 0.84 cm2
AV Area mean vel: 0.9 cm2
AV Mean grad: 22 mmHg
AV Peak grad: 35.5 mmHg
Ao pk vel: 2.98 m/s
Area-P 1/2: 2.62 cm2
MV VTI: 1.22 cm2
P 1/2 time: 548 msec
S' Lateral: 2.3 cm

## 2020-07-10 ENCOUNTER — Encounter: Payer: Self-pay | Admitting: Student

## 2020-07-10 ENCOUNTER — Ambulatory Visit (INDEPENDENT_AMBULATORY_CARE_PROVIDER_SITE_OTHER): Payer: MEDICARE | Admitting: Student

## 2020-07-10 ENCOUNTER — Other Ambulatory Visit: Payer: Self-pay

## 2020-07-10 VITALS — BP 128/62 | HR 69 | Ht 65.0 in | Wt 158.0 lb

## 2020-07-10 DIAGNOSIS — I10 Essential (primary) hypertension: Secondary | ICD-10-CM

## 2020-07-10 DIAGNOSIS — I495 Sick sinus syndrome: Secondary | ICD-10-CM

## 2020-07-10 MED ORDER — HYDROCHLOROTHIAZIDE 12.5 MG PO CAPS: 12.5000 mg | ORAL_CAPSULE | Freq: Every day | ORAL | 3 refills | Status: DC

## 2020-07-10 NOTE — Patient Instructions (Signed)
Medication Instructions:  Your physician has recommended you make the following change in your medication:   CHANGE: Only take Furosemide as needed but no more than 1-2 times weekly.  *If you need a refill on your cardiac medications before your next appointment, please call your pharmacy*   Lab Work: None If you have labs (blood work) drawn today and your tests are completely normal, you will receive your results only by: [name] MyChart Message (if you have MyChart) OR . A paper copy in the mail If you have any lab test that is abnormal or we need to change your treatment, we will call you to review the results.   Follow-Up: At [facility], you and your health needs are our priority.  As part of our continuing mission to provide you with exceptional heart care, we have created designated Provider Care Teams.  These Care Teams include your primary Cardiologist (physician) and Advanced Practice Providers (APPs -  Physician Assistants and Nurse Practitioners) who all work together to provide you with the care you need, when you need it.  Your next appointment:   3 month(s)  The format for your next appointment:   In Person  Provider:   [name] "Oda Kilts, PA-C

## 2020-07-10 NOTE — Progress Notes (Signed)
Electrophysiology Office Note Date: 07/10/2020  ID:  Kayla Davis, DOB January 19, 1923, MRN [id number]  PCP: [name], MD Primary Cardiologist: No primary care provider on file. Electrophysiologist: Thompson Grayer, MD   CC: Pacemaker follow-up  Kayla Davis is a 87 y.o. female symptomatic sinus brady s/p MDT DDD PPM, HTN, and chronic diastolic CHF who isseen today for Thompson Grayer, MD for routine electrophysiology followup after being starting on lasix 6 weeks prior.  Since last being seen in our clinic the patient reports doing very well. She through she remembered having issues with lasix in the past, and thus had not started it, but continued her HCTZ. She is able to walk moderate distances at pace, taking short, occasional breaks at baseline. She denies any new or undue SOB. She has resumed her exercising and feels "much better". Denies dizziness, lightheadedness, or near syncope.   Device History: Medtronic Dual Chamber PPM implanted 01/2010 for symptomatic bradycardia.  Past Medical History:  Diagnosis Date  . Anemia, unspecified   . Anxiety state, unspecified   . Aortic valve disorders   . Bradycardia    s/p PPM  . Bursitis   . Complication of anesthesia    has had difficulty awakening in past; pt states did well with the anesthesia given during her carotid surgery   . Diabetes mellitus without complication ([place])   . Diverticulosis of colon (without mention of hemorrhage)   . Herpes zoster with other nervous system complications(053.19)   . Hyperlipidemia   . Hypertension   . Hypothyroidism   . Irritable bowel syndrome   . Osteoarthrosis, unspecified whether generalized or localized, unspecified site   . Peripheral vascular disease ([place])   . Squamous cell carcinoma of skin 04/21/2012   left sideburn - tx p bx  . Squamous cell carcinoma of skin 01/10/2013   recurrent on left sideburn - tx p bx  . Squamous cell carcinoma of skin 08/22/2013   left sideburn  - tx p bx  . TIA (transient ischemic attack)   . Unspecified hearing loss   . Varicose veins    Past Surgical History:  Procedure Laterality Date  . APPENDECTOMY    . CATARACT EXTRACTION, BILATERAL  end of June 2013  . CHOLECYSTECTOMY    . DILATION AND CURETTAGE OF UTERUS    . ENDARTERECTOMY Right 01/20/2014   Procedure: RIGHT CAROTID ARTERY ENDARTERECTOMY WITH DACRON PATCH ANGIOPLASTY;  Surgeon: Mal Misty, MD;  Location: [place];  Service: Vascular;  Laterality: Right;  . PACEMAKER INSERTION     MDT dual chamber PPM implanted by Dr [name] for sinus node dysfunction   . TONSILLECTOMY    . TOTAL HIP ARTHROPLASTY Left 03/23/2015   Procedure: TOTAL HIP ARTHROPLASTY ANTERIOR APPROACH;  Surgeon: Gaynelle Arabian, MD;  Location: [facility];  Service: Orthopedics;  Laterality: Left;  [name] VAGINAL HYSTERECTOMY      Current Outpatient Medications  Medication Sig Dispense Refill  . ALPHA LIPOIC ACID PO Take 1 tablet by mouth daily.    [name] aspirin 81 MG EC tablet TAKE 1 TABLET BY MOUTH EVERY DAY 90 tablet 3  . BAYER LOW DOSE 81 MG EC tablet Take 81 mg by mouth daily.    . cetirizine (ZYRTEC) 10 MG tablet Take 10 mg by mouth at bedtime.    . Coenzyme Q10 (CO Q 10 PO) Take 1 tablet by mouth daily.    [name] CRANBERRY PO Take 3 tablets by mouth daily with supper.     [name]  Cyanocobalamin (VITAMIN B-12 PO) Take 1 tablet by mouth at bedtime.    [name] Calcium (STOOL SOFTENER PO) Take 1 capsule by mouth at bedtime.     [name] estradiol (ESTRACE) 0.1 MG/GM vaginal cream PV as directed 42.5 g 3  . EVENING PRIMROSE OIL PO Take 1 capsule by mouth daily.    . furosemide (LASIX) 20 MG tablet Take 1 tablet (20 mg total) by mouth daily. 90 tablet 3  . hydrochlorothiazide (MICROZIDE) 12.5 MG capsule Take 1 capsule (12.5 mg total) by mouth daily. 90 capsule 3  . ipratropium (ATROVENT) 0.06 % nasal spray Place 2 sprays into both nostrils 3 (three) times daily. 45 mL 3  . Lancets (ONETOUCH ULTRASOFT) lancets Use to check blood  sugars once a day Dx E11.9 100 each 3  . levothyroxine (SYNTHROID) 100 MCG tablet Take 1 tablet (100 mcg total) by mouth daily before breakfast. 90 tablet 3  . Lidocaine 4 % PTCH Apply 1 patch topically 2 (two) times daily. 12 patch 0  . losartan (COZAAR) 50 MG tablet Take 1 tablet (50 mg total) by mouth daily. 90 tablet 3  . metoprolol tartrate (LOPRESSOR) 25 MG tablet TAKE 1 TABLET BY MOUTH TWICE A DAY 180 tablet 3  . Multiple Vitamins-Minerals (EYE VITAMINS) CAPS Take 1 capsule by mouth daily.    Glory Rosebush ULTRA test strip USE TO CHECK BLOOD SUGAR TWICE DAILY 100 strip 12  . Polyethyl [name] (SYSTANE OP) Apply to eye daily.    . polyethylene glycol (MIRALAX / GLYCOLAX) packet Take 17 g by mouth daily as needed for mild constipation.    . pregabalin (LYRICA) 200 MG capsule Take 1 capsule (200 mg total) by mouth at bedtime. 90 capsule 1  . simvastatin (ZOCOR) 40 MG tablet TAKE 1 TABLET DAILY 90 tablet 3  . tiZANidine (ZANAFLEX) 2 MG tablet Take 2 tablets (4 mg total) by mouth 3 (three) times daily. 12 tablet 0   No current facility-administered medications for this visit.    Allergies:   Penicillins, Alprazolam, Bactrim [sulfamethoxazole-trimethoprim], Ceclor [cefaclor], Cephalexin, Ciprofloxacin, Clindamycin/lincomycin, Hydrocodone, Noroxin [norfloxacin], Sulfonamide derivatives, Tetracyclines & related, and Tramadol   Social History: Social History   Socioeconomic History  . Marital status: Widowed    Spouse name: Gypsy Balsam.  (deceased)  . Number of children: 4  . Years of education: COLLEGE  . Highest education level: Not on file  Occupational History  . Occupation: Retired    Fish farm manager: RETIRED  Tobacco Use  . Smoking status: Never Smoker  . Smokeless tobacco: Never Used  Vaping Use  . Vaping Use: Never used  Substance and Sexual Activity  . Alcohol use: No    Alcohol/week: 0.0 standard drinks  . Drug use: No  . Sexual activity: Not Currently  Other Topics  Concern  . Not on file  Social History Narrative   Patient is widowed with 4 children. Son stays with patient at night, 08/11/16.   Patient is right handed.   Patient has college education.   Patient drinks 2 cups daily.   Social Determinants of Health   Financial Resource Strain: Low Risk   . Difficulty of Paying Living Expenses: Not hard at all  Food Insecurity: No Food Insecurity  . Worried About Charity fundraiser in the Last Year: Never true  . Ran Out of Food in the Last Year: Never true  Transportation Needs: No Transportation Needs  . Lack of Transportation (Medical): No  . Lack of Transportation (Non-Medical):  No  Physical Activity: Insufficiently Active  . Days of Exercise per Week: 5 days  . Minutes of Exercise per Session: 20 min  Stress: No Stress Concern Present  . Feeling of Stress : Not at all  Social Connections: Not on file  Intimate Partner Violence: Not on file    Family History: Family History  Problem Relation Age of Onset  . Heart disease Mother   . Heart disease Father   . Diabetes Father   . Cancer Paternal Grandmother      Review of Systems: All other systems reviewed and are otherwise negative except as noted above.  Physical Exam: Vitals:   07/10/20 1156  BP: 128/62  Pulse: 69  SpO2: 93%  Weight: 158 lb (71.7 kg)  Height: 5\' 5"  (1.651 m)    Wt Readings from Last 3 Encounters:  07/10/20 158 lb (71.7 kg)  06/01/20 157 lb 6.4 oz (71.4 kg)  03/01/20 156 lb 3.2 oz (70.9 kg)    GEN- The patient is well appearing, alert and oriented x 3 today.   HEENT: normocephalic, atraumatic; sclera clear, conjunctiva pink; hearing intact; oropharynx clear; neck supple  Lungs- Clear to ausculation bilaterally, normal work of breathing.  No wheezes, rales, rhonchi Heart- Regular rate and rhythm, no murmurs, rubs or gallops  GI- soft, non-tender, non-distended, bowel sounds present  Extremities- no clubbing or cyanosis. No edema MS- no significant  deformity or atrophy Skin- warm and dry, no rash or lesion; PPM pocket well healed Psych- euthymic mood, full affect Neuro- strength and sensation are intact  PPM Interrogation- last visits check reviewed in detail today,  See PACEART report. Not checked today.   EKG:  EKG is ordered today. The ekg ordered today shows AV dual paced at 69 bpm, PR interval 216 ms, QRS 154 ms  Recent Labs: 02/16/2020: ALT 13; Hemoglobin 11.8; Platelets 190.0; TSH 8.98 06/01/2020: BUN 25; Creatinine, Ser 0.92; NT-Pro BNP 737; Potassium 4.3; Sodium 137   Wt Readings from Last 3 Encounters:  07/10/20 158 lb (71.7 kg)  06/01/20 157 lb 6.4 oz (71.4 kg)  03/01/20 156 lb 3.2 oz (70.9 kg)     Other studies Reviewed: Additional studies/ records that were reviewed today include: Previous EP office notes, Previous remote checks, Most recent labwork.   Assessment and Plan:  1. Symptomatic bradycardia s/p Medtronic PPM  Normal PPM function at visit last month. Not rechecked today.  See most recent Pace Art report No changes today  2. Chronic diastolic CHF Discussed sliding scale diuretics She has continued her HCTZ and wishes to remain on Can use lasix 20 mg AS NEEDED, not to exceed 1-2 times a week without a visit for concerns of dehydration  3. HTN Stable today.    Current medicines are reviewed at length with the patient today.   The patient does not have concerns regarding her medicines.  The following changes were made today:  none  Labs/ tests ordered today include:  Orders Placed This Encounter  Procedures  . EKG 12-Lead     Disposition:   Follow up with EP APP in 3 Months    Signed, Annamaria Helling  07/10/2020 12:23 PM  [address] [place] [address] [phone] (office) [phone] (fax)

## 2020-07-12 ENCOUNTER — Telehealth: Payer: Self-pay | Admitting: Internal Medicine

## 2020-07-12 NOTE — Telephone Encounter (Signed)
The patient has been notified of the Echo result and verbalized understanding.  All questions (if any) were answered. Darrell Jewel, RN 07/12/2020 5:20 PM

## 2020-07-12 NOTE — Telephone Encounter (Signed)
No answer and voicemail box is full.  

## 2020-07-12 NOTE — Telephone Encounter (Signed)
Pt called in returning call about her results   Best number [phone]

## 2020-07-18 ENCOUNTER — Ambulatory Visit (INDEPENDENT_AMBULATORY_CARE_PROVIDER_SITE_OTHER): Payer: MEDICARE

## 2020-07-18 DIAGNOSIS — I495 Sick sinus syndrome: Secondary | ICD-10-CM

## 2020-07-20 LAB — CUP PACEART REMOTE DEVICE CHECK
Battery Impedance: 1827 Ohm
Battery Remaining Longevity: 33 mo
Battery Voltage: 2.74 V
Brady Statistic AP VP Percent: 94 %
Brady Statistic AP VS Percent: 0 %
Brady Statistic AS VP Percent: 6 %
Brady Statistic AS VS Percent: 0 %
Date Time Interrogation Session: 20220407190539
Implantable Lead Implant Date: 20111031
Implantable Lead Implant Date: 20111031
Implantable Lead Location: 753859
Implantable Lead Location: 753860
Implantable Lead Model: 4469
Implantable Lead Model: 4470
Implantable Lead Serial Number: 548557
Implantable Lead Serial Number: 686076
Implantable Pulse Generator Implant Date: 20111031
Lead Channel Impedance Value: 473 Ohm
Lead Channel Impedance Value: 496 Ohm
Lead Channel Pacing Threshold Amplitude: 0.75 V
Lead Channel Pacing Threshold Amplitude: 0.75 V
Lead Channel Pacing Threshold Pulse Width: 0.4 ms
Lead Channel Pacing Threshold Pulse Width: 0.4 ms
Lead Channel Setting Pacing Amplitude: 2 V
Lead Channel Setting Pacing Amplitude: 2.5 V
Lead Channel Setting Pacing Pulse Width: 0.4 ms
Lead Channel Setting Sensing Sensitivity: 5.6 mV

## 2020-07-21 DIAGNOSIS — Z23 Encounter for immunization: Secondary | ICD-10-CM | POA: Diagnosis not present

## 2020-07-23 DIAGNOSIS — H26492 Other secondary cataract, left eye: Secondary | ICD-10-CM | POA: Diagnosis not present

## 2020-07-23 DIAGNOSIS — Z961 Presence of intraocular lens: Secondary | ICD-10-CM | POA: Diagnosis not present

## 2020-07-23 DIAGNOSIS — E119 Type 2 diabetes mellitus without complications: Secondary | ICD-10-CM | POA: Diagnosis not present

## 2020-07-23 DIAGNOSIS — H5212 Myopia, left eye: Secondary | ICD-10-CM | POA: Diagnosis not present

## 2020-07-23 LAB — HM DIABETES EYE EXAM

## 2020-07-24 ENCOUNTER — Encounter: Payer: Self-pay | Admitting: Internal Medicine

## 2020-07-31 NOTE — Progress Notes (Signed)
Remote pacemaker transmission.   

## 2020-08-08 ENCOUNTER — Other Ambulatory Visit: Payer: Self-pay | Admitting: Internal Medicine

## 2020-08-14 ENCOUNTER — Other Ambulatory Visit: Payer: Self-pay

## 2020-08-15 ENCOUNTER — Encounter: Payer: Self-pay | Admitting: Internal Medicine

## 2020-08-15 ENCOUNTER — Ambulatory Visit (INDEPENDENT_AMBULATORY_CARE_PROVIDER_SITE_OTHER): Payer: MEDICARE | Admitting: Internal Medicine

## 2020-08-15 ENCOUNTER — Other Ambulatory Visit: Payer: Self-pay

## 2020-08-15 VITALS — BP 112/68 | HR 65 | Temp 98.1°F | Ht 65.0 in | Wt 158.6 lb

## 2020-08-15 DIAGNOSIS — B029 Zoster without complications: Secondary | ICD-10-CM

## 2020-08-15 DIAGNOSIS — H00011 Hordeolum externum right upper eyelid: Secondary | ICD-10-CM

## 2020-08-15 DIAGNOSIS — R531 Weakness: Secondary | ICD-10-CM

## 2020-08-15 DIAGNOSIS — H00019 Hordeolum externum unspecified eye, unspecified eyelid: Secondary | ICD-10-CM | POA: Insufficient documentation

## 2020-08-15 DIAGNOSIS — I700 Atherosclerosis of aorta: Secondary | ICD-10-CM | POA: Diagnosis not present

## 2020-08-15 DIAGNOSIS — E119 Type 2 diabetes mellitus without complications: Secondary | ICD-10-CM

## 2020-08-15 DIAGNOSIS — I10 Essential (primary) hypertension: Secondary | ICD-10-CM | POA: Diagnosis not present

## 2020-08-15 DIAGNOSIS — R269 Unspecified abnormalities of gait and mobility: Secondary | ICD-10-CM | POA: Diagnosis not present

## 2020-08-15 LAB — COMPREHENSIVE METABOLIC PANEL
ALT: 14 U/L (ref 0–35)
AST: 21 U/L (ref 0–37)
Albumin: 4.1 g/dL (ref 3.5–5.2)
Alkaline Phosphatase: 75 U/L (ref 39–117)
BUN: 24 mg/dL — ABNORMAL HIGH (ref 6–23)
CO2: 32 mEq/L (ref 19–32)
Calcium: 9.4 mg/dL (ref 8.4–10.5)
Chloride: 96 mEq/L (ref 96–112)
Creatinine, Ser: 1.02 mg/dL (ref 0.40–1.20)
GFR: 46.71 mL/min — ABNORMAL LOW (ref >60.00)
Glucose, Bld: 84 mg/dL (ref 70–99)
Potassium: 4.6 mEq/L (ref 3.5–5.1)
Sodium: 133 mEq/L — ABNORMAL LOW (ref 135–145)
Total Bilirubin: 0.5 mg/dL (ref 0.2–1.2)
Total Protein: 6.4 g/dL (ref 6.0–8.3)

## 2020-08-15 LAB — HEMOGLOBIN A1C: Hgb A1c MFr Bld: 6.6 % — ABNORMAL HIGH (ref 4.6–6.5)

## 2020-08-15 MED ORDER — DOXYCYCLINE HYCLATE 100 MG PO TABS: 100.0000 mg | ORAL_TABLET | Freq: Two times a day (BID) | ORAL | 0 refills | Status: DC

## 2020-08-15 MED ORDER — VALACYCLOVIR HCL 1 G PO TABS: 1000.0000 mg | ORAL_TABLET | Freq: Three times a day (TID) | ORAL | 0 refills | Status: DC

## 2020-08-15 MED ORDER — ERYTHROMYCIN 5 MG/GM OP OINT: 1.0000 "application " | TOPICAL_OINTMENT | Freq: Three times a day (TID) | OPHTHALMIC | 0 refills | Status: DC

## 2020-08-15 NOTE — Assessment & Plan Note (Signed)
R hand - treat w/Valtrex po

## 2020-08-15 NOTE — Assessment & Plan Note (Signed)
Use a walker 

## 2020-08-15 NOTE — Assessment & Plan Note (Signed)
R eye upper lid - Erythro oint and Rx Doxy po x 5 d

## 2020-08-15 NOTE — Addendum Note (Signed)
Addended by: Boris Lown B on: 08/15/2020 02:24 PM   Modules accepted: Orders

## 2020-08-15 NOTE — Assessment & Plan Note (Signed)
On Toprol 

## 2020-08-15 NOTE — Progress Notes (Signed)
Subjective:  Patient ID: Kayla Davis, female    DOB: [birth year]  Age: 87 y.o. MRN: [id number]  CC: Follow-up (6 month f/u- pt c/o rash/blister on (R) hand)   HPI Kayla Davis presents for a rash on R ulnar hand x 2-3 d - itching  Outpatient Medications Prior to Visit  Medication Sig Dispense Refill  . ALPHA LIPOIC ACID PO Take 1 tablet by mouth daily.    [name] BAYER LOW DOSE 81 MG EC tablet Take 81 mg by mouth daily.    . cetirizine (ZYRTEC) 10 MG tablet Take 10 mg by mouth at bedtime.    . Coenzyme Q10 (CO Q 10 PO) Take 1 tablet by mouth daily.    [name] CRANBERRY PO Take 3 tablets by mouth daily with supper.     . Cyanocobalamin (VITAMIN B-12 PO) Take 1 tablet by mouth at bedtime.    [name] Calcium (STOOL SOFTENER PO) Take 1 capsule by mouth at bedtime.     [name] estradiol (ESTRACE) 0.1 MG/GM vaginal cream PV as directed 42.5 g 3  . EVENING PRIMROSE OIL PO Take 1 capsule by mouth daily.    . furosemide (LASIX) 20 MG tablet Take 1 tablet (20 mg total) by mouth daily. 90 tablet 3  . hydrochlorothiazide (MICROZIDE) 12.5 MG capsule TAKE 1 CAPSULE DAILY 90 capsule 3  . ipratropium (ATROVENT) 0.06 % nasal spray Place 2 sprays into both nostrils 3 (three) times daily. 45 mL 3  . Lancets (ONETOUCH ULTRASOFT) lancets Use to check blood sugars once a day Dx E11.9 100 each 3  . levothyroxine (SYNTHROID) 100 MCG tablet Take 1 tablet (100 mcg total) by mouth daily before breakfast. 90 tablet 3  . losartan (COZAAR) 50 MG tablet Take 1 tablet (50 mg total) by mouth daily. 90 tablet 3  . metoprolol tartrate (LOPRESSOR) 25 MG tablet TAKE 1 TABLET BY MOUTH TWICE A DAY 180 tablet 3  . Multiple Vitamins-Minerals (EYE VITAMINS) CAPS Take 1 capsule by mouth daily.    Glory Rosebush ULTRA test strip USE TO CHECK BLOOD SUGAR TWICE DAILY 100 strip 12  . Polyethyl [name] (SYSTANE OP) Apply to eye daily.    . polyethylene glycol (MIRALAX / GLYCOLAX) packet Take 17 g by mouth daily as needed for  mild constipation.    . pregabalin (LYRICA) 200 MG capsule Take 1 capsule (200 mg total) by mouth at bedtime. 90 capsule 1  . simvastatin (ZOCOR) 40 MG tablet TAKE 1 TABLET DAILY 90 tablet 3  . aspirin 81 MG EC tablet TAKE 1 TABLET BY MOUTH EVERY DAY 90 tablet 3  . Lidocaine 4 % PTCH Apply 1 patch topically 2 (two) times daily. (Patient not taking: Reported on 08/15/2020) 12 patch 0  . tiZANidine (ZANAFLEX) 2 MG tablet Take 2 tablets (4 mg total) by mouth 3 (three) times daily. (Patient not taking: Reported on 08/15/2020) 12 tablet 0   No facility-administered medications prior to visit.    ROS: Review of Systems  Constitutional: Negative for activity change, appetite change, chills, fatigue and unexpected weight change.  HENT: Negative for congestion, mouth sores and sinus pressure.   Eyes: Negative for visual disturbance.  Respiratory: Negative for cough and chest tightness.   Gastrointestinal: Negative for abdominal pain and nausea.  Genitourinary: Negative for difficulty urinating, frequency and vaginal pain.  Musculoskeletal: Positive for gait problem. Negative for back pain.  Skin: Positive for rash. Negative for pallor.  Neurological: Positive for weakness. Negative for dizziness, tremors,  numbness and headaches.  Psychiatric/Behavioral: Negative for confusion and sleep disturbance.    Objective:  BP 112/68 (BP Location: Left Arm)   Pulse 65   Temp 98.1 F (36.7 C) (Oral)   Ht 5\' 5"  (1.651 m)   Wt 158 lb 9.6 oz (71.9 kg)   LMP  (LMP Unknown)   SpO2 96%   BMI 26.39 kg/m   BP Readings from Last 3 Encounters:  08/15/20 112/68  07/10/20 128/62  06/01/20 138/62    Wt Readings from Last 3 Encounters:  08/15/20 158 lb 9.6 oz (71.9 kg)  07/10/20 158 lb (71.7 kg)  06/01/20 157 lb 6.4 oz (71.4 kg)    Physical Exam Constitutional:      General: She is not in acute distress.    Appearance: She is well-developed. She is obese. She is not ill-appearing or toxic-appearing.   HENT:     Head: Normocephalic.     Right Ear: External ear normal.     Left Ear: External ear normal.     Nose: Nose normal.  Eyes:     General:        Right eye: No discharge.        Left eye: No discharge.     Conjunctiva/sclera: Conjunctivae normal.     Pupils: Pupils are equal, round, and reactive to light.  Neck:     Thyroid: No thyromegaly.     Vascular: No JVD.     Trachea: No tracheal deviation.  Cardiovascular:     Rate and Rhythm: Normal rate and regular rhythm.     Heart sounds: Normal heart sounds.  Pulmonary:     Effort: No respiratory distress.     Breath sounds: No stridor. No wheezing.  Abdominal:     General: Bowel sounds are normal. There is no distension.     Palpations: Abdomen is soft. There is no mass.     Tenderness: There is no abdominal tenderness. There is no guarding or rebound.  Musculoskeletal:        General: No tenderness.     Cervical back: Normal range of motion and neck supple.  Lymphadenopathy:     Cervical: No cervical adenopathy.  Skin:    Findings: No erythema or rash.  Neurological:     Mental Status: She is oriented to person, place, and time.     Cranial Nerves: No cranial nerve deficit.     Motor: No abnormal muscle tone.     Coordination: Coordination abnormal.     Deep Tendon Reflexes: Reflexes normal.  Psychiatric:        Behavior: Behavior normal.        Thought Content: Thought content normal.        Judgment: Judgment normal.    a rash on R ulnar hand x 2-3 d - eryth base w/blisters 4x6 cm R upper eyelid stye Using a walker  Lab Results  Component Value Date   WBC 6.6 02/16/2020   HGB 11.8 (L) 02/16/2020   HCT 34.8 (L) 02/16/2020   PLT 190.0 02/16/2020   GLUCOSE 159 (H) 06/01/2020   CHOL 162 12/24/2016   TRIG 97.0 12/24/2016   HDL 80.70 12/24/2016   LDLDIRECT 143.0 10/07/2007   LDLCALC 62 12/24/2016   ALT 13 02/16/2020   AST 19 02/16/2020   NA 137 06/01/2020   K 4.3 06/01/2020   CL 99 06/01/2020    CREATININE 0.92 06/01/2020   BUN 25 06/01/2020   CO2 23 06/01/2020   TSH 8.98 (  H) 02/16/2020   INR 1.1 04/30/2019   HGBA1C 6.7 (H) 02/16/2020    CT LUMBAR SPINE WO CONTRAST  Result Date: 04/25/2020 CLINICAL DATA:  Fall 1 month ago.  Back pain. EXAM: CT LUMBAR SPINE WITHOUT CONTRAST TECHNIQUE: Multidetector CT imaging of the lumbar spine was performed without intravenous contrast administration. Multiplanar CT image reconstructions were also generated. COMPARISON:  CT lumbar spine 03/15/2017 FINDINGS: Segmentation: Normal Alignment: Mild retrolisthesis L1-2, L2-3, L3-4. 10 mm anterolisthesis L5-S1 unchanged. Lumbar scoliosis unchanged. Vertebrae: Superior endplate fracture of L1 with mild progression since the prior study. The fracture. Acute on the prior study. Mild superior endplate fracture of N98 appears chronic and unchanged. Mild superior endplate fracture of L3 is unchanged. No acute fracture or mass lesion. Nonobstructing left renal calculi. Paraspinal and other soft tissues: Atherosclerotic calcification in the aorta and iliac arteries. Abdominal aorta nonaneurysmal measuring up to 21 mm in diameter. No paraspinous mass or adenopathy. Sigmoid diverticulosis. Disc levels: T12-L1: Mild disc degeneration.  Negative for stenosis L1-2: Moderate disc degeneration with disc space narrowing and gas in the disc space. Diffuse disc bulging. Negative for stenosis L2-3: Disc degeneration with diffuse disc bulging and endplate spurring. Negative for spinal or foraminal stenosis. Mild left subarticular stenosis. L3-4: Disc degeneration with disc space narrowing and extensive gas in the disc space. Disc degeneration asymmetric to the left. Associated spurring asymmetric to the left. Bilateral facet and ligamentum flavum hypertrophy. Moderate spinal stenosis. Moderate subarticular and foraminal stenosis on the left. No change from the prior study. L4-5: Disc degeneration asymmetric to the right with disc space  narrowing, gas in the disc space, and asymmetric spurring to the right. Facet and ligamentum flavum hypertrophy. Mild to moderate spinal stenosis. Moderate subarticular and foraminal stenosis on the right. No interval change. L5-S1: 10 mm anterolisthesis with chronic bilateral pars defects of L5. Bilateral L5 nerve root impingement in the foramen unchanged from the prior study. IMPRESSION: Advanced multilevel disc and facet degeneration throughout the lumbar spine. Multilevel spinal and foraminal stenosis similar to the prior CT lumbar spine 03/16/2007 Chronic compression fractures of T12, L1, L3.  No acute fracture. Aortic Atherosclerosis (ICD10-I70.0). Electronically Signed   By: Franchot Gallo M.D.   On: 04/25/2020 12:57    Assessment & Plan:   Follow-up: No follow-ups on file.  [name], MD

## 2020-08-26 DIAGNOSIS — I700 Atherosclerosis of aorta: Secondary | ICD-10-CM | POA: Insufficient documentation

## 2020-08-26 NOTE — Assessment & Plan Note (Signed)
On Simvastatin 

## 2020-08-29 ENCOUNTER — Other Ambulatory Visit: Payer: Self-pay | Admitting: Internal Medicine

## 2020-08-29 ENCOUNTER — Other Ambulatory Visit: Payer: Self-pay | Admitting: Neurology

## 2020-10-07 NOTE — Progress Notes (Signed)
Cardiology Office Note Date:  10/09/2020  Patient ID:  Kayla, Davis 1922/10/30, MRN [id number] PCP:  [name], MD  Electrophysiologist: Dr. [name]    Chief Complaint:  40mo visit  History of Present Illness: Kayla Davis is a 87 y.o. female with history of DM, HTN, HLDm IBS, TIA, symptomatic bradycardia w/PPM, chronic CHfF(diastolic)  She comes in today to be seen for dr. [name], last seen by him Feb 2022, at that visit noted she had some baseline SOB though of late with more DOE.  She was volume OL her HCTZ stopped and started on lasix, also noted she was pacing with an Av delay of 336ms and MVP was turned off, planned for an echo.  At her visit with A. Tillery, [place] 07/10/20 she reported never starting the lasix recalling historically having had a side effect from it and remained on her HCTZ, despite this reported having resumed her exercises and was feeling much better Planned to keep the lasix 20mg  to uses PRN (not to exceed 1-2 doses a week).   TODAY She is accompanied by her son All in all doing fairly well it seems.  She has a number of noncardiac concerns today, notices a tremor of her hands and head. Feels funny when laying on her left side. She ambulates with a walker, continues to get winded, her son says this has been a progression over the last 3 years or so No rest SOB No CP, palpitations or cardiac awareness No near syncope or syncope  She is pretty worried that her heart has worsened over the last year, her son mentions that she saw the diagnosis of CHF on her chart and has become very worried has looked it up and gotten a bit fixated on this  Device information MDT dual chamber PPM implanted 02/11/2010   Past Medical History:  Diagnosis Date   Anemia, unspecified    Anxiety state, unspecified    Aortic valve disorders    Bradycardia    s/p PPM   Bursitis    Complication of anesthesia    has had difficulty awakening in past; pt states  did well with the anesthesia given during her carotid surgery    Diabetes mellitus without complication ([place])    Diverticulosis of colon (without mention of hemorrhage)    Herpes zoster with other nervous system complications(053.19)    Hyperlipidemia    Hypertension    Hypothyroidism    Irritable bowel syndrome    Osteoarthrosis, unspecified whether generalized or localized, unspecified site    Peripheral vascular disease ([place])    Squamous cell carcinoma of skin 04/21/2012   left sideburn - tx p bx   Squamous cell carcinoma of skin 01/10/2013   recurrent on left sideburn - tx p bx   Squamous cell carcinoma of skin 08/22/2013   left sideburn - tx p bx   TIA (transient ischemic attack)    Unspecified hearing loss    Varicose veins     Past Surgical History:  Procedure Laterality Date   APPENDECTOMY     CATARACT EXTRACTION, BILATERAL  end of June 2013   CHOLECYSTECTOMY     DILATION AND CURETTAGE OF UTERUS     ENDARTERECTOMY Right 01/20/2014   Procedure: RIGHT CAROTID ARTERY ENDARTERECTOMY WITH DACRON PATCH ANGIOPLASTY;  Surgeon: Mal Misty, MD;  Location: [facility] OR;  Service: Vascular;  Laterality: Right;   PACEMAKER INSERTION     MDT dual chamber PPM implanted by Dr [name] for  sinus node dysfunction    TONSILLECTOMY     TOTAL HIP ARTHROPLASTY Left 03/23/2015   Procedure: TOTAL HIP ARTHROPLASTY ANTERIOR APPROACH;  Surgeon: Gaynelle Arabian, MD;  Location: [facility] OR[facility];  Service: Orthopedics;  Laterality: Left;   VAGINAL HYSTERECTOMY      Current Outpatient Medications  Medication Sig Dispense Refill   ALPHA LIPOIC ACID PO Take 1 tablet by mouth daily.     BAYER LOW DOSE 81 MG EC tablet Take 81 mg by mouth daily.     cetirizine (ZYRTEC) 10 MG tablet Take 10 mg by mouth at bedtime.     Coenzyme Q10 (CO Q 10 PO) Take 1 tablet by mouth daily.     CRANBERRY PO Take 3 tablets by mouth daily with supper.      Cyanocobalamin (VITAMIN B-12 PO) Take 1 tablet by mouth at bedtime.     Docusate  Calcium (STOOL SOFTENER PO) Take 1 capsule by mouth at bedtime.      doxycycline (VIBRA-TABS) 100 MG tablet Take 1 tablet (100 mg total) by mouth 2 (two) times daily. 10 tablet 0   erythromycin ophthalmic ointment Place 1 application into the left eye 3 (three) times daily. 3.5 g 0   estradiol (ESTRACE) 0.1 MG/GM vaginal cream PV as directed 42.5 g 3   EVENING PRIMROSE OIL PO Take 1 capsule by mouth daily.     furosemide (LASIX) 20 MG tablet Take 20 mg by mouth daily as needed (NO MORE THAN 1 TO 2 TIMES  A WEEK).     hydrochlorothiazide (MICROZIDE) 12.5 MG capsule Take 12.5 mg by mouth daily.     ipratropium (ATROVENT) 0.06 % nasal spray Place 2 sprays into both nostrils 3 (three) times daily. 45 mL 3   Lancets (ONETOUCH ULTRASOFT) lancets Use to check blood sugars once a day Dx E11.9 100 each 3   levothyroxine (SYNTHROID) 100 MCG tablet Take 1 tablet (100 mcg total) by mouth daily before breakfast. 90 tablet 3   losartan (COZAAR) 50 MG tablet Take 1 tablet (50 mg total) by mouth daily. 90 tablet 3   metoprolol tartrate (LOPRESSOR) 25 MG tablet TAKE 1 TABLET BY MOUTH TWICE A DAY 180 tablet 3   Multiple Vitamins-Minerals (EYE VITAMINS) CAPS Take 1 capsule by mouth daily.     ONETOUCH ULTRA test strip USE TO CHECK BLOOD SUGAR TWICE DAILY 100 strip 12   Polyethyl [name] (SYSTANE OP) Apply to eye daily.     polyethylene glycol (MIRALAX / GLYCOLAX) packet Take 17 g by mouth daily as needed for mild constipation.     pregabalin (LYRICA) 200 MG capsule TAKE ONE CAPSULE BY MOUTH AT BEDTIME 90 capsule 1   simvastatin (ZOCOR) 40 MG tablet TAKE 1 TABLET DAILY 90 tablet 3   valACYclovir (VALTREX) 1000 MG tablet Take 1 tablet (1,000 mg total) by mouth 3 (three) times daily. 21 tablet 0   No current facility-administered medications for this visit.    Allergies:   Penicillins, Alprazolam, Bactrim [sulfamethoxazole-trimethoprim], Ceclor [cefaclor], Cephalexin, Ciprofloxacin,  Clindamycin/lincomycin, Hydrocodone, Noroxin [norfloxacin], Sulfonamide derivatives, Tetracyclines & related, and Tramadol   Social History:  The patient  reports that she has never smoked. She has never used smokeless tobacco. She reports that she does not drink alcohol and does not use drugs.   Family History:  The patient's family history includes Cancer in her paternal grandmother; Diabetes in her father; Heart disease in her father and mother.  ROS:  Please see the history of present illness.  All other systems are reviewed and otherwise negative.   PHYSICAL EXAM:  VS:  BP 118/64   Pulse 62   Ht 5\' 5"  (1.651 m)   Wt 159 lb (72.1 kg)   LMP  (LMP Unknown)   BMI 26.46 kg/m  BMI: Body mass index is 26.46 kg/m. Well nourished, well developed, in no acute distress HEENT: normocephalic, atraumatic Neck: no JVD, carotid bruits or masses Cardiac: RRR; soft SM, no rubs, or gallops Lungs:  CTA b/l, no wheezing, rhonchi or rales Abd: soft, nontender MS: no deformity, age appropriate atrophy Ext: trace if any edema Skin: warm and dry, no rash Neuro:  No gross deficits appreciated Psych: euthymic mood, full affect  PPM site (R side) is stable, no tethering or discomfort   EKG:  not done today  Device interrogation done today and reviewed by myself:  Battery and lead measurements are good She paces at 40bpm today With loss of RV capture though did have some V beats come in AP/VP 94% HVR/AMS episodes reviewed When EGMs are available these are an SVT, longest 1inute   06/25/2020: TTE IMPRESSIONS  1. The aortic valve is calcified. There is moderate-to-severe  calcification of the aortic valve. There is moderate-to-severe thickening  of the aortic valve. Aortic valve regurgitation is mild. Moderate aortic  valve stenosis with AVA by continuity of  0.83cm2, mean gradient 59mmHg, peak gradient 22mmHg, Vmax 32m/s, DI 0.27.   2. Left ventricular ejection fraction, by estimation, is  60 to 65%. The  left ventricle has normal function. The left ventricle has no regional  wall motion abnormalities. There is mild concentric left ventricular  hypertrophy. Left ventricular diastolic  parameters are consistent with Grade II diastolic dysfunction  (pseudonormalization). Elevated left atrial pressure.   3. Right ventricular systolic function is normal. The right ventricular  size is mildly enlarged. There is normal pulmonary artery systolic  pressure.   4. Left atrial size was moderately dilated.   5. The mitral valve is degenerative. Trivial mitral valve regurgitation.  Mild calcific mitral stenosis. Moderate mitral annular calcification. MVA  by continuity 1.5cm2; mean gradient 52mmHg.   Comparison(s): Compared to prior echo in 2018, there is now moderate  aortic stenosis.    02/23/2015: stress myoview Nuclear stress EF: 70%. Normal LV function. There was no ST segment deviation noted during stress. The study is normal. No evidence of ischemia. This is a low risk study.   Recent Labs: 02/16/2020: Hemoglobin 11.8; Platelets 190.0; TSH 8.98 06/01/2020: NT-Pro BNP 737 08/15/2020: ALT 14; BUN 24; Creatinine, Ser 1.02; Potassium 4.6; Sodium 133  No results found for requested labs within last 8760 hours.   CrCl cannot be calculated (Patient's most recent lab result is older than the maximum 21 days allowed.).   Wt Readings from Last 3 Encounters:  10/09/20 159 lb (72.1 kg)  08/15/20 158 lb 9.6 oz (71.9 kg)  07/10/20 158 lb (71.7 kg)     Other studies reviewed: Additional studies/records reviewed today include: summarized above  ASSESSMENT AND PLAN:  PPM Intact function, no programming changes made  2. Chronic CHF (diastolic) Appears compensated Weight is stable, exam does not suggest volume OL today Discussed this today Reviewed parameters for PRN lasix uses   3. HTN Looks good  3. SVT Brief and asymptomatic   Disposition: F/u with remotes as usual  and in clinic in 35mo, sooner if needed  Current medicines are reviewed at length with the patient today.  The patient did not have any  concerns regarding medicines.  Venetia Night, PA-C 10/09/2020 4:32 PM     [address] [place] [address] [phone] (office)  [phone] (fax)

## 2020-10-09 ENCOUNTER — Other Ambulatory Visit: Payer: Self-pay

## 2020-10-09 ENCOUNTER — Ambulatory Visit (INDEPENDENT_AMBULATORY_CARE_PROVIDER_SITE_OTHER): Payer: MEDICARE | Admitting: Physician Assistant

## 2020-10-09 VITALS — BP 118/64 | HR 62 | Ht 65.0 in | Wt 159.0 lb

## 2020-10-09 DIAGNOSIS — I5032 Chronic diastolic (congestive) heart failure: Secondary | ICD-10-CM

## 2020-10-09 DIAGNOSIS — Z950 Presence of cardiac pacemaker: Secondary | ICD-10-CM | POA: Diagnosis not present

## 2020-10-09 DIAGNOSIS — I471 Supraventricular tachycardia: Secondary | ICD-10-CM

## 2020-10-09 DIAGNOSIS — I10 Essential (primary) hypertension: Secondary | ICD-10-CM

## 2020-10-09 DIAGNOSIS — I495 Sick sinus syndrome: Secondary | ICD-10-CM

## 2020-10-09 LAB — CUP PACEART INCLINIC DEVICE CHECK
Battery Impedance: 1915 Ohm
Battery Remaining Longevity: 31 mo
Battery Voltage: 2.74 V
Brady Statistic AP VP Percent: 94 %
Brady Statistic AP VS Percent: 0 %
Brady Statistic AS VP Percent: 6 %
Brady Statistic AS VS Percent: 0 %
Date Time Interrogation Session: 20220628174700
Implantable Lead Implant Date: 20111031
Implantable Lead Implant Date: 20111031
Implantable Lead Location: 753859
Implantable Lead Location: 753860
Implantable Lead Model: 4469
Implantable Lead Model: 4470
Implantable Lead Serial Number: 548557
Implantable Lead Serial Number: 686076
Implantable Pulse Generator Implant Date: 20111031
Lead Channel Impedance Value: 468 Ohm
Lead Channel Impedance Value: 494 Ohm
Lead Channel Pacing Threshold Amplitude: 0.625 V
Lead Channel Pacing Threshold Amplitude: 0.75 V
Lead Channel Pacing Threshold Amplitude: 0.75 V
Lead Channel Pacing Threshold Amplitude: 1 V
Lead Channel Pacing Threshold Pulse Width: 0.4 ms
Lead Channel Pacing Threshold Pulse Width: 0.4 ms
Lead Channel Pacing Threshold Pulse Width: 0.4 ms
Lead Channel Pacing Threshold Pulse Width: 0.4 ms
Lead Channel Setting Pacing Amplitude: 2 V
Lead Channel Setting Pacing Amplitude: 2.5 V
Lead Channel Setting Pacing Pulse Width: 0.4 ms
Lead Channel Setting Sensing Sensitivity: 5.6 mV

## 2020-10-09 NOTE — Patient Instructions (Signed)
Medication Instructions:   MAKE SURE TO TAKE  A TABLET OF FUROSEMIDE 20 MG  IF WEIGHT GAIN OF 3 LBS IN A DAY AND 5 LBS IN A WEEK. CALL CLINIC IF YOU HAVE TO TAKE DAILY.Also continue taking the rest of your current medications as directed. Please refer to the Current Medication list given to you today.   *If you need a refill on your cardiac medications before your next appointment, please call your pharmacy*   Lab Work: [place]   If you have labs (blood work) drawn today and your tests are completely normal, you will receive your results only by: [place] (if you have MyChart) OR A paper copy in the mail If you have any lab test that is abnormal or we need to change your treatment, we will call you to review the results.   Testing/Procedures: NONE ORDERED  TODAY   Follow-Up: At [facility], you and your health needs are our priority.  As part of our continuing mission to provide you with exceptional heart care, we have created designated Provider Care Teams.  These Care Teams include your primary Cardiologist (physician) and Advanced Practice Providers (APPs -  Physician Assistants and Nurse Practitioners) who all work together to provide you with the care you need, when you need it.  We recommend signing up for the patient portal called "MyChart".  Sign up information is provided on this After Visit Summary.  MyChart is used to connect with patients for Virtual Visits (Telemedicine).  Patients are able to view lab/test results, encounter notes, upcoming appointments, etc.  Non-urgent messages can be sent to your provider as well.   To learn more about what you can do with MyChart, go to [email or web address].    Your next appointment:   6 month(s)  The format for your next appointment:   In Person  Provider:   You may see Thompson Grayer, MD or one of the following Advanced Practice Providers on your designated Care Team:   Chanetta Marshall, NP Tommye Standard,  PA-C [name] "Oda Kilts, [place]   Other Instructions

## 2020-10-17 ENCOUNTER — Ambulatory Visit (INDEPENDENT_AMBULATORY_CARE_PROVIDER_SITE_OTHER): Payer: MEDICARE

## 2020-10-17 DIAGNOSIS — I495 Sick sinus syndrome: Secondary | ICD-10-CM

## 2020-10-18 LAB — CUP PACEART REMOTE DEVICE CHECK
Battery Impedance: 1856 Ohm
Battery Remaining Longevity: 32 mo
Battery Voltage: 2.74 V
Brady Statistic AP VP Percent: 96 %
Brady Statistic AP VS Percent: 0 %
Brady Statistic AS VP Percent: 4 %
Brady Statistic AS VS Percent: 0 %
Date Time Interrogation Session: 20220706212458
Implantable Lead Implant Date: 20111031
Implantable Lead Implant Date: 20111031
Implantable Lead Location: 753859
Implantable Lead Location: 753860
Implantable Lead Model: 4469
Implantable Lead Model: 4470
Implantable Lead Serial Number: 548557
Implantable Lead Serial Number: 686076
Implantable Pulse Generator Implant Date: 20111031
Lead Channel Impedance Value: 464 Ohm
Lead Channel Impedance Value: 478 Ohm
Lead Channel Pacing Threshold Amplitude: 0.625 V
Lead Channel Pacing Threshold Amplitude: 0.75 V
Lead Channel Pacing Threshold Pulse Width: 0.4 ms
Lead Channel Pacing Threshold Pulse Width: 0.4 ms
Lead Channel Setting Pacing Amplitude: 2 V
Lead Channel Setting Pacing Amplitude: 2.5 V
Lead Channel Setting Pacing Pulse Width: 0.4 ms
Lead Channel Setting Sensing Sensitivity: 5.6 mV

## 2020-10-31 MED ORDER — SIMVASTATIN 40 MG PO TABS: ORAL_TABLET | ORAL | 0 refills | Status: DC

## 2020-10-31 MED ORDER — LEVOTHYROXINE SODIUM 100 MCG PO TABS: 100.0000 ug | ORAL_TABLET | Freq: Every day | ORAL | 0 refills | Status: DC

## 2020-11-08 NOTE — Progress Notes (Signed)
Remote pacemaker transmission.   

## 2020-11-19 ENCOUNTER — Other Ambulatory Visit: Payer: Self-pay

## 2020-11-19 ENCOUNTER — Ambulatory Visit (INDEPENDENT_AMBULATORY_CARE_PROVIDER_SITE_OTHER): Payer: MEDICARE | Admitting: Internal Medicine

## 2020-11-19 ENCOUNTER — Encounter: Payer: Self-pay | Admitting: Internal Medicine

## 2020-11-19 DIAGNOSIS — E034 Atrophy of thyroid (acquired): Secondary | ICD-10-CM | POA: Diagnosis not present

## 2020-11-19 DIAGNOSIS — G609 Hereditary and idiopathic neuropathy, unspecified: Secondary | ICD-10-CM

## 2020-11-19 DIAGNOSIS — H6121 Impacted cerumen, right ear: Secondary | ICD-10-CM | POA: Diagnosis not present

## 2020-11-19 DIAGNOSIS — R269 Unspecified abnormalities of gait and mobility: Secondary | ICD-10-CM

## 2020-11-19 DIAGNOSIS — R531 Weakness: Secondary | ICD-10-CM | POA: Diagnosis not present

## 2020-11-19 DIAGNOSIS — E119 Type 2 diabetes mellitus without complications: Secondary | ICD-10-CM | POA: Diagnosis not present

## 2020-11-19 MED ORDER — PREGABALIN 150 MG PO CAPS: 150.0000 mg | ORAL_CAPSULE | Freq: Every day | ORAL | 3 refills | Status: DC

## 2020-11-19 NOTE — Assessment & Plan Note (Signed)
Continue with Levothroid

## 2020-11-19 NOTE — Assessment & Plan Note (Signed)
Right ear.  Will irrigate.  See the procedure

## 2020-11-19 NOTE — Assessment & Plan Note (Signed)
The patient is using a walker.  We will try to reduce Lyrica.  Fall risk reduction discussed with the patient and her son

## 2020-11-19 NOTE — Assessment & Plan Note (Signed)
She is on a high dose of Lyrica at night.  Since she has had some mild weakness spells, would like to taper off from Lyrica.  She is willing to proceed.  Reduce Lyrica to 150 mg for 1 month, then reevaluate

## 2020-11-19 NOTE — Assessment & Plan Note (Signed)
Will monitor glucose, A1c

## 2020-11-19 NOTE — Progress Notes (Signed)
Subjective:  Patient ID: Kayla Davis, female    DOB: 02/12/1923  Age: 87 y.o. MRN: [id number]  CC: Follow-up   HPI ZACHARY PELISSIER presents for hearing loss out of right ear.  She wants me to check it for wax. F/u dyslipidemia, pacemaker Pt did not feel well on Fri - ?weakness - resolved C/o neck pain She is here with her son who helps with history  Outpatient Medications Prior to Visit  Medication Sig Dispense Refill   ALPHA LIPOIC ACID PO Take 1 tablet by mouth daily.     BAYER LOW DOSE 81 MG EC tablet Take 81 mg by mouth daily.     cetirizine (ZYRTEC) 10 MG tablet Take 10 mg by mouth at bedtime.     Coenzyme Q10 (CO Q 10 PO) Take 1 tablet by mouth daily.     CRANBERRY PO Take 3 tablets by mouth daily with supper.      Cyanocobalamin (VITAMIN B-12 PO) Take 1 tablet by mouth at bedtime.     Docusate Calcium (STOOL SOFTENER PO) Take 1 capsule by mouth at bedtime.      estradiol (ESTRACE) 0.1 MG/GM vaginal cream PV as directed 42.5 g 3   EVENING PRIMROSE OIL PO Take 1 capsule by mouth daily.     furosemide (LASIX) 20 MG tablet Take 20 mg by mouth daily as needed (NO MORE THAN 1 TO 2 TIMES  A WEEK).     hydrochlorothiazide (MICROZIDE) 12.5 MG capsule Take 12.5 mg by mouth daily.     ipratropium (ATROVENT) 0.06 % nasal spray Place 2 sprays into both nostrils 3 (three) times daily. 45 mL 3   Lancets (ONETOUCH ULTRASOFT) lancets Use to check blood sugars once a day Dx E11.9 100 each 3   levothyroxine (SYNTHROID) 100 MCG tablet Take 1 tablet (100 mcg total) by mouth daily before breakfast. Annual appt due in Nov must see provider for future refills 90 tablet 0   losartan (COZAAR) 50 MG tablet Take 1 tablet (50 mg total) by mouth daily. 90 tablet 3   metoprolol tartrate (LOPRESSOR) 25 MG tablet TAKE 1 TABLET BY MOUTH TWICE A DAY 180 tablet 3   Multiple Vitamins-Minerals (EYE VITAMINS) CAPS Take 1 capsule by mouth daily.     ONETOUCH ULTRA test strip USE TO CHECK BLOOD SUGAR TWICE  DAILY 100 strip 12   polyethylene glycol (MIRALAX / GLYCOLAX) packet Take 17 g by mouth daily as needed for mild constipation.     pregabalin (LYRICA) 200 MG capsule TAKE ONE CAPSULE BY MOUTH AT BEDTIME 90 capsule 1   simvastatin (ZOCOR) 40 MG tablet TAKE 1 TABLET DAILY Annual appt due in Nov must see provider for future refills 90 tablet 0   doxycycline (VIBRA-TABS) 100 MG tablet Take 1 tablet (100 mg total) by mouth 2 (two) times daily. (Patient not taking: Reported on 11/19/2020) 10 tablet 0   erythromycin ophthalmic ointment Place 1 application into the left eye 3 (three) times daily. (Patient not taking: Reported on 11/19/2020) 3.5 g 0   Polyethyl [name] (SYSTANE OP) Apply to eye daily. (Patient not taking: Reported on 11/19/2020)     valACYclovir (VALTREX) 1000 MG tablet Take 1 tablet (1,000 mg total) by mouth 3 (three) times daily. (Patient not taking: Reported on 11/19/2020) 21 tablet 0   No facility-administered medications prior to visit.    ROS: Review of Systems  Constitutional:  Positive for fatigue. Negative for activity change, appetite change, chills and unexpected weight  change.  HENT:  Positive for hearing loss. Negative for congestion, mouth sores and sinus pressure.   Eyes:  Negative for visual disturbance.  Respiratory:  Negative for cough and chest tightness.   Gastrointestinal:  Negative for abdominal pain and nausea.  Genitourinary:  Negative for difficulty urinating, frequency and vaginal pain.  Musculoskeletal:  Positive for arthralgias and gait problem. Negative for back pain.  Skin:  Negative for pallor and rash.  Neurological:  Positive for weakness. Negative for dizziness, tremors, numbness and headaches.  Psychiatric/Behavioral:  Positive for decreased concentration. Negative for confusion and sleep disturbance. The patient is nervous/anxious.    Objective:  BP (!) 142/82 (BP Location: Left Arm)   Pulse 77   Ht '5\' 5"'$  (1.651 m)   Wt 157 lb 12.8 oz  (71.6 kg)   LMP  (LMP Unknown)   SpO2 96%   BMI 26.26 kg/m   BP Readings from Last 3 Encounters:  11/19/20 (!) 142/82  10/09/20 118/64  08/15/20 112/68    Wt Readings from Last 3 Encounters:  11/19/20 157 lb 12.8 oz (71.6 kg)  10/09/20 159 lb (72.1 kg)  08/15/20 158 lb 9.6 oz (71.9 kg)    Physical Exam Constitutional:      General: She is not in acute distress.    Appearance: Normal appearance. She is well-developed.  HENT:     Head: Normocephalic.     Right Ear: External ear normal.     Left Ear: External ear normal.     Nose: Nose normal.  Eyes:     General:        Right eye: No discharge.        Left eye: No discharge.     Conjunctiva/sclera: Conjunctivae normal.     Pupils: Pupils are equal, round, and reactive to light.  Neck:     Thyroid: No thyromegaly.     Vascular: No JVD.     Trachea: No tracheal deviation.  Cardiovascular:     Rate and Rhythm: Normal rate and regular rhythm.     Heart sounds: Normal heart sounds.  Pulmonary:     Effort: No respiratory distress.     Breath sounds: No stridor. No wheezing.  Abdominal:     General: Bowel sounds are normal. There is no distension.     Palpations: Abdomen is soft. There is no mass.     Tenderness: There is no abdominal tenderness. There is no guarding or rebound.  Musculoskeletal:        General: No tenderness.     Cervical back: Normal range of motion and neck supple. No rigidity.  Lymphadenopathy:     Cervical: No cervical adenopathy.  Skin:    Findings: No erythema or rash.  Neurological:     Cranial Nerves: No cranial nerve deficit.     Motor: Weakness present. No abnormal muscle tone.     Coordination: Coordination normal.     Gait: Gait abnormal.     Deep Tendon Reflexes: Reflexes normal.  Psychiatric:        Behavior: Behavior normal.        Thought Content: Thought content normal.        Judgment: Judgment normal.   Using a walker  R ear wax   Procedure Note :     Procedure :  Ear  irrigation right ear   Indication:  Cerumen impaction right and left ears   Risks, including pain, dizziness, eardrum perforation, bleeding, infection and others as well as benefits  were explained to the patient in detail. Verbal consent was obtained and the patient agreed to proceed.    We used "The Elephant Ear Irrigation Device" filled with lukewarm water for irrigation. A large amount wax was recovered from both ears. Procedure has also required manual wax removal/instrumentation with an ear wax curette and ear forceps on the right and left ears.   Tolerated well. Complications: None.   Postprocedure instructions :  Call if problems.  Lab Results  Component Value Date   WBC 6.6 02/16/2020   HGB 11.8 (L) 02/16/2020   HCT 34.8 (L) 02/16/2020   PLT 190.0 02/16/2020   GLUCOSE 84 08/15/2020   CHOL 162 12/24/2016   TRIG 97.0 12/24/2016   HDL 80.70 12/24/2016   LDLDIRECT 143.0 10/07/2007   LDLCALC 62 12/24/2016   ALT 14 08/15/2020   AST 21 08/15/2020   NA 133 (L) 08/15/2020   K 4.6 08/15/2020   CL 96 08/15/2020   CREATININE 1.02 08/15/2020   BUN 24 (H) 08/15/2020   CO2 32 08/15/2020   TSH 8.98 (H) 02/16/2020   INR 1.1 04/30/2019   HGBA1C 6.6 (H) 08/15/2020    CT LUMBAR SPINE WO CONTRAST  Result Date: 04/25/2020 CLINICAL DATA:  Fall 1 month ago.  Back pain. EXAM: CT LUMBAR SPINE WITHOUT CONTRAST TECHNIQUE: Multidetector CT imaging of the lumbar spine was performed without intravenous contrast administration. Multiplanar CT image reconstructions were also generated. COMPARISON:  CT lumbar spine 03/15/2017 FINDINGS: Segmentation: Normal Alignment: Mild retrolisthesis L1-2, L2-3, L3-4. 10 mm anterolisthesis L5-S1 unchanged. Lumbar scoliosis unchanged. Vertebrae: Superior endplate fracture of L1 with mild progression since the prior study. The fracture. Acute on the prior study. Mild superior endplate fracture of 624THL appears chronic and unchanged. Mild superior endplate fracture of L3  is unchanged. No acute fracture or mass lesion. Nonobstructing left renal calculi. Paraspinal and other soft tissues: Atherosclerotic calcification in the aorta and iliac arteries. Abdominal aorta nonaneurysmal measuring up to 21 mm in diameter. No paraspinous mass or adenopathy. Sigmoid diverticulosis. Disc levels: T12-L1: Mild disc degeneration.  Negative for stenosis L1-2: Moderate disc degeneration with disc space narrowing and gas in the disc space. Diffuse disc bulging. Negative for stenosis L2-3: Disc degeneration with diffuse disc bulging and endplate spurring. Negative for spinal or foraminal stenosis. Mild left subarticular stenosis. L3-4: Disc degeneration with disc space narrowing and extensive gas in the disc space. Disc degeneration asymmetric to the left. Associated spurring asymmetric to the left. Bilateral facet and ligamentum flavum hypertrophy. Moderate spinal stenosis. Moderate subarticular and foraminal stenosis on the left. No change from the prior study. L4-5: Disc degeneration asymmetric to the right with disc space narrowing, gas in the disc space, and asymmetric spurring to the right. Facet and ligamentum flavum hypertrophy. Mild to moderate spinal stenosis. Moderate subarticular and foraminal stenosis on the right. No interval change. L5-S1: 10 mm anterolisthesis with chronic bilateral pars defects of L5. Bilateral L5 nerve root impingement in the foramen unchanged from the prior study. IMPRESSION: Advanced multilevel disc and facet degeneration throughout the lumbar spine. Multilevel spinal and foraminal stenosis similar to the prior CT lumbar spine 03/16/2007 Chronic compression fractures of T12, L1, L3.  No acute fracture. Aortic Atherosclerosis (ICD10-I70.0). Electronically Signed   By: Franchot Gallo M.D.   On: 04/25/2020 12:57    Assessment & Plan:

## 2020-11-19 NOTE — Assessment & Plan Note (Signed)
We will try to taper off Lyrica

## 2020-11-20 ENCOUNTER — Telehealth: Payer: Self-pay | Admitting: Internal Medicine

## 2020-11-21 NOTE — Telephone Encounter (Signed)
Completed PA via cover-my-meds received msg stating " This approval authorizes your coverage from 04/14/2020 - 11/21/2021". Faxed approval back to [facility]../[facility]

## 2020-12-05 ENCOUNTER — Other Ambulatory Visit: Payer: Self-pay | Admitting: Internal Medicine

## 2020-12-26 DIAGNOSIS — Z23 Encounter for immunization: Secondary | ICD-10-CM | POA: Diagnosis not present

## 2021-01-15 MED ORDER — LOSARTAN POTASSIUM 50 MG PO TABS: 50.0000 mg | ORAL_TABLET | Freq: Every day | ORAL | 1 refills | Status: AC

## 2021-01-16 ENCOUNTER — Ambulatory Visit (INDEPENDENT_AMBULATORY_CARE_PROVIDER_SITE_OTHER): Payer: MEDICARE

## 2021-01-16 ENCOUNTER — Other Ambulatory Visit: Payer: Self-pay | Admitting: Internal Medicine

## 2021-01-16 DIAGNOSIS — I495 Sick sinus syndrome: Secondary | ICD-10-CM

## 2021-01-17 LAB — CUP PACEART REMOTE DEVICE CHECK
Battery Impedance: 1981 Ohm
Battery Remaining Longevity: 31 mo
Battery Voltage: 2.74 V
Brady Statistic AP VP Percent: 95 %
Brady Statistic AP VS Percent: 0 %
Brady Statistic AS VP Percent: 5 %
Brady Statistic AS VS Percent: 0 %
Date Time Interrogation Session: 20221005194356
Implantable Lead Implant Date: 20111031
Implantable Lead Implant Date: 20111031
Implantable Lead Location: 753859
Implantable Lead Location: 753860
Implantable Lead Model: 4469
Implantable Lead Model: 4470
Implantable Lead Serial Number: 548557
Implantable Lead Serial Number: 686076
Implantable Pulse Generator Implant Date: 20111031
Lead Channel Impedance Value: 484 Ohm
Lead Channel Impedance Value: 549 Ohm
Lead Channel Pacing Threshold Amplitude: 0.625 V
Lead Channel Pacing Threshold Amplitude: 0.625 V
Lead Channel Pacing Threshold Pulse Width: 0.4 ms
Lead Channel Pacing Threshold Pulse Width: 0.4 ms
Lead Channel Setting Pacing Amplitude: 2 V
Lead Channel Setting Pacing Amplitude: 2.5 V
Lead Channel Setting Pacing Pulse Width: 0.4 ms
Lead Channel Setting Sensing Sensitivity: 5.6 mV

## 2021-01-24 NOTE — Progress Notes (Signed)
Remote pacemaker transmission.   

## 2021-01-28 ENCOUNTER — Telehealth: Payer: Self-pay | Admitting: Internal Medicine

## 2021-01-28 NOTE — Telephone Encounter (Signed)
Yes, if it has not been done elsewhere in the past 10 years.  She can get tDAP at the pharmacy.  Thanks

## 2021-01-28 NOTE — Telephone Encounter (Signed)
Patient wants to know if provider recommends getting the TDAP vaccine

## 2021-01-29 NOTE — Telephone Encounter (Signed)
Notified pt w/MD response.../lmb 

## 2021-03-04 ENCOUNTER — Other Ambulatory Visit: Payer: Self-pay

## 2021-03-04 ENCOUNTER — Other Ambulatory Visit: Payer: Self-pay | Admitting: Internal Medicine

## 2021-03-04 ENCOUNTER — Ambulatory Visit (INDEPENDENT_AMBULATORY_CARE_PROVIDER_SITE_OTHER): Payer: MEDICARE

## 2021-03-04 ENCOUNTER — Other Ambulatory Visit (INDEPENDENT_AMBULATORY_CARE_PROVIDER_SITE_OTHER): Payer: MEDICARE

## 2021-03-04 ENCOUNTER — Telehealth: Payer: Self-pay

## 2021-03-04 VITALS — BP 118/60 | HR 83 | Temp 98.1°F | Ht 65.0 in | Wt 157.0 lb

## 2021-03-04 DIAGNOSIS — R300 Dysuria: Secondary | ICD-10-CM

## 2021-03-04 DIAGNOSIS — Z0000 Encounter for general adult medical examination without abnormal findings: Secondary | ICD-10-CM

## 2021-03-04 DIAGNOSIS — N3000 Acute cystitis without hematuria: Secondary | ICD-10-CM | POA: Insufficient documentation

## 2021-03-04 LAB — URINALYSIS, ROUTINE W REFLEX MICROSCOPIC
Bilirubin Urine: NEGATIVE
Nitrite: NEGATIVE
Specific Gravity, Urine: 1.015 (ref 1.000–1.030)
Total Protein, Urine: NEGATIVE
Urine Glucose: NEGATIVE
Urobilinogen, UA: 0.2 (ref 0.0–1.0)
pH: 7 (ref 5.0–8.0)

## 2021-03-04 MED ORDER — NITROFURANTOIN MONOHYD MACRO 100 MG PO CAPS: 100.0000 mg | ORAL_CAPSULE | Freq: Two times a day (BID) | ORAL | 0 refills | Status: AC

## 2021-03-04 NOTE — Telephone Encounter (Signed)
Patient complaining of dysuria.  Patient has history of recurrent UTI.  Ordered placed by Dr. Scarlette Calico for urinalysis.  Lisette Abu, LPN

## 2021-03-04 NOTE — Progress Notes (Signed)
Subjective:   Kayla Davis is a 87 y.o. female who presents for Medicare Annual (Subsequent) preventive examination.  Review of Systems     Cardiac Risk Factors include: advanced age (>70men, >21 women);dyslipidemia;family history of premature cardiovascular disease;hypertension     Objective:    Today's Vitals   03/04/21 1420  BP: 118/60  Pulse: 83  Temp: 98.1 F (36.7 C)  SpO2: 93%  Weight: 157 lb (71.2 kg)  Height: 5\' 5"  (1.651 m)  PainSc: 0-No pain   Body mass index is 26.13 kg/m.  Advanced Directives 03/04/2021 03/01/2020 10/22/2019 04/30/2019 03/01/2019 10/21/2018 06/18/2018  Does Patient Have a Medical Advance Directive? Yes Yes No Yes Yes Yes No  Type of Advance Directive Living will;Healthcare Power of Attorney Living will;Healthcare Power of [place];Living will - -  Does patient want to make changes to medical advance directive? No - Patient declined No - Patient declined - - - - -  Copy of [place] in Chart? No - copy requested No - copy requested - - No - copy requested - -  Would patient like information on creating a medical advance directive? - - - - - - No - Patient declined  Pre-existing out of facility DNR order (yellow form or pink MOST form) - - - - - - -    Current Medications (verified) Outpatient Encounter Medications as of 03/04/2021  Medication Sig   ALPHA LIPOIC ACID PO Take 1 tablet by mouth daily.   aspirin 81 MG EC tablet TAKE 1 TABLET BY MOUTH EVERY DAY   cetirizine (ZYRTEC) 10 MG tablet Take 10 mg by mouth at bedtime.   Coenzyme Q10 (CO Q 10 PO) Take 1 tablet by mouth daily.   CRANBERRY PO Take 3 tablets by mouth daily with supper.    Cyanocobalamin (VITAMIN B-12 PO) Take 1 tablet by mouth at bedtime.   Docusate Calcium (STOOL SOFTENER PO) Take 1 capsule by mouth at bedtime.    estradiol (ESTRACE) 0.1 MG/GM vaginal cream PV as directed   EVENING PRIMROSE OIL PO Take 1 capsule by mouth  daily.   furosemide (LASIX) 20 MG tablet Take 20 mg by mouth daily as needed (NO MORE THAN 1 TO 2 TIMES  A WEEK).   hydrochlorothiazide (MICROZIDE) 12.5 MG capsule Take 12.5 mg by mouth daily.   ipratropium (ATROVENT) 0.06 % nasal spray Place 2 sprays into both nostrils 3 (three) times daily.   Lancets (ONETOUCH ULTRASOFT) lancets Use to check blood sugars once a day Dx E11.9   levothyroxine (SYNTHROID) 100 MCG tablet TAKE 1 TABLET DAILY BEFORE BREAKFAST   losartan (COZAAR) 50 MG tablet Take 1 tablet (50 mg total) by mouth daily.   metoprolol tartrate (LOPRESSOR) 25 MG tablet TAKE 1 TABLET BY MOUTH TWICE A DAY   Multiple Vitamins-Minerals (EYE VITAMINS) CAPS Take 1 capsule by mouth daily.   ONETOUCH ULTRA test strip USE TO CHECK BLOOD SUGAR TWICE DAILY   polyethylene glycol (MIRALAX / GLYCOLAX) packet Take 17 g by mouth daily as needed for mild constipation.   pregabalin (LYRICA) 150 MG capsule Take 1 capsule (150 mg total) by mouth at bedtime.   simvastatin (ZOCOR) 40 MG tablet TAKE 1 TABLET DAILY   No facility-administered encounter medications on file as of 03/04/2021.    Allergies (verified) Penicillins, Alprazolam, Bactrim [sulfamethoxazole-trimethoprim], Ceclor [cefaclor], Cephalexin, Ciprofloxacin, Clindamycin/lincomycin, Hydrocodone, Noroxin [norfloxacin], Sulfonamide derivatives, Tetracyclines & related, and Tramadol   History: Past Medical History:  Diagnosis Date   Anemia, unspecified    Anxiety state, unspecified    Aortic valve disorders    Bradycardia    s/p PPM   Bursitis    Complication of anesthesia    has had difficulty awakening in past; pt states did well with the anesthesia given during her carotid surgery    Diabetes mellitus without complication ([place])    Diverticulosis of colon (without mention of hemorrhage)    Herpes zoster with other nervous system complications(053.19)    Hyperlipidemia    Hypertension    Hypothyroidism    Irritable bowel syndrome     Osteoarthrosis, unspecified whether generalized or localized, unspecified site    Peripheral vascular disease ([place])    Squamous cell carcinoma of skin 04/21/2012   left sideburn - tx p bx   Squamous cell carcinoma of skin 01/10/2013   recurrent on left sideburn - tx p bx   Squamous cell carcinoma of skin 08/22/2013   left sideburn - tx p bx   TIA (transient ischemic attack)    Unspecified hearing loss    Varicose veins    Past Surgical History:  Procedure Laterality Date   APPENDECTOMY     CATARACT EXTRACTION, BILATERAL  end of June 2013   CHOLECYSTECTOMY     DILATION AND CURETTAGE OF UTERUS     ENDARTERECTOMY Right 01/20/2014   Procedure: RIGHT CAROTID ARTERY ENDARTERECTOMY WITH DACRON PATCH ANGIOPLASTY;  Surgeon: Mal Misty, MD;  Location: [facility] OR;  Service: Vascular;  Laterality: Right;   PACEMAKER INSERTION     MDT dual chamber PPM implanted by Dr [name] for sinus node dysfunction    TONSILLECTOMY     TOTAL HIP ARTHROPLASTY Left 03/23/2015   Procedure: TOTAL HIP ARTHROPLASTY ANTERIOR APPROACH;  Surgeon: Gaynelle Arabian, MD;  Location: [facility];  Service: Orthopedics;  Laterality: Left;   VAGINAL HYSTERECTOMY     Family History  Problem Relation Age of Onset   Heart disease Mother    Heart disease Father    Diabetes Father    Cancer Paternal Grandmother    Social History   Socioeconomic History   Marital status: Widowed    Spouse name: Gypsy Balsam.  (deceased)   Number of children: 4   Years of education: COLLEGE   Highest education level: Not on file  Occupational History   Occupation: Retired    Fish farm manager: RETIRED  Tobacco Use   Smoking status: Never   Smokeless tobacco: Never  Vaping Use   Vaping Use: Never used  Substance and Sexual Activity   Alcohol use: No    Alcohol/week: 0.0 standard drinks   Drug use: No   Sexual activity: Not Currently  Other Topics Concern   Not on file  Social History Narrative   Patient is widowed with 4 children. Son stays with  patient at night, 08/11/16.   Patient is right handed.   Patient has college education.   Patient drinks 2 cups daily.   Social Determinants of Health   Financial Resource Strain: Low Risk    Difficulty of Paying Living Expenses: Not hard at all  Food Insecurity: No Food Insecurity   Worried About Charity fundraiser in the Last Year: Never true   [place] in the Last Year: Never true  Transportation Needs: No Transportation Needs   Lack of Transportation (Medical): No   Lack of Transportation (Non-Medical): No  Physical Activity: Sufficiently Active   Days of Exercise per Week: 5 days  Minutes of Exercise per Session: 50 min  Stress: No Stress Concern Present   Feeling of Stress : Not at all  Social Connections: Moderately Integrated   Frequency of Communication with Friends and Family: More than three times a week   Frequency of Social Gatherings with Friends and Family: More than three times a week   Attends Religious Services: 1 to 4 times per year   Active Member of [employer] or Organizations: Yes   Attends Archivist Meetings: 1 to 4 times per year   Marital Status: Widowed    Tobacco Counseling Counseling given: Not Answered   Clinical Intake:  Pre-visit preparation completed: Yes  Pain : No/denies pain Pain Score: 0-No pain     BMI - recorded: 26.13 Nutritional Status: BMI 25 -29 Overweight Nutritional Risks: None Diabetes: No  How often do you need to have someone help you when you read instructions, pamphlets, or other written materials from your doctor or pharmacy?: 1 - Never What is the last grade level you completed in school?: HSG; 2 years of [place]  Diabetic? no  Interpreter Needed?: No  Information entered by :: Lisette Abu, LPN   Activities of Daily Living In your present state of health, do you have any difficulty performing the following activities: 03/04/2021  Hearing? N  Vision? N  Difficulty concentrating  or making decisions? N  Walking or climbing stairs? Y  Comment uses a walker  Dressing or bathing? N  Doing errands, shopping? Y  Comment Her son or daughter brings her to appointments  Preparing Food and eating ? N  Using the Toilet? N  In the past six months, have you accidently leaked urine? Y  Comment Wears protection  Do you have problems with loss of bowel control? N  Managing your Medications? N  Managing your Finances? N  Housekeeping or managing your Housekeeping? N  Comment does what she can  Some recent data might be hidden    Patient Care Team: Plotnikov, Evie Lacks, MD as PCP - General (Internal Medicine) Thompson Grayer, MD as PCP - Electrophysiology (Cardiology) Shon Hough, MD as Consulting Physician (Ophthalmology) Thompson Grayer, MD as Consulting Physician (Cardiology) Suella Broad, MD as Consulting Physician (Physical Medicine and Rehabilitation) Gaynelle Arabian, MD as Consulting Physician (Orthopedic Surgery) Nickie Retort, MD (Inactive) as Consulting Physician (Urology) [name], DO as Consulting Physician (Neurology)  Indicate any recent Medical Services you may have received from other than [facility] providers in the past year (date may be approximate).     Assessment:   This is a routine wellness examination for Teresa.  Hearing/Vision screen No results found.  Dietary issues and exercise activities discussed: Current Exercise Habits: Home exercise routine, Type of exercise: walking, Time (Minutes): 50, Frequency (Times/Week): 5, Weekly Exercise (Minutes/Week): 250, Intensity: Mild, Exercise limited by: orthopedic condition(s)   Goals Addressed   None   Depression Screen PHQ 2/9 Scores 03/04/2021 03/01/2020 03/01/2019 02/25/2018 01/01/2018 09/11/2016 07/03/2015  PHQ - 2 Score 0 0 1 0 0 0 0  PHQ- 9 Score - - 3 2 - - -    Fall Risk Fall Risk  03/04/2021 03/01/2020 03/01/2019 10/21/2018 02/25/2018  Falls in the past year? 1 1 1  0 0  Number  falls in past yr: 1 0 1 - -  Injury with Fall? 0 0 0 - -  Risk for fall due to : Impaired balance/gait;History of fall(s);Orthopedic patient Impaired balance/gait History of fall(s);Impaired balance/gait;Impaired mobility - Impaired balance/gait;Impaired mobility  Follow up Falls evaluation completed Falls evaluation completed;Education provided - - -    FALL RISK PREVENTION PERTAINING TO THE HOME:  Any stairs in or around the home? No  If so, are there any without handrails? No  Home free of loose throw rugs in walkways, pet beds, electrical cords, etc? Yes  Adequate lighting in your home to reduce risk of falls? Yes   ASSISTIVE DEVICES UTILIZED TO PREVENT FALLS:  Life alert? Yes  Use of a cane, walker or w/c? Yes  Grab bars in the bathroom? Yes  Shower chair or bench in shower? Yes  Elevated toilet seat or a handicapped toilet? Yes   TIMED UP AND GO:  Was the test performed? Yes .  Length of time to ambulate 10 feet: 13 sec.   Gait slow and steady with assistive device  Cognitive Function: MMSE - Mini Mental State Exam 02/25/2018 09/11/2016 07/03/2015  Not completed: - - (No Data)  Orientation to time 5 5 -  Orientation to Place 5 5 -  Registration 3 3 -  Attention/ Calculation 5 3 -  Recall 2 2 -  Language- name 2 objects 2 2 -  Language- repeat 1 1 -  Language- follow 3 step command 3 3 -  Language- read & follow direction 1 1 -  Write a sentence 1 1 -  Copy design 1 1 -  Total score 29 27 -     6CIT Screen 03/01/2020 03/01/2019  What Year? 0 points 0 points  What month? 0 points 0 points  What time? 3 points 0 points  Count back from 20 0 points 0 points  Months in reverse 0 points 0 points  Repeat phrase 4 points 0 points  Total Score 7 0    Immunizations Immunization History  Administered Date(s) Administered   Fluad Quad(high Dose 65+) 12/24/2018, 12/26/2020   Influenza Split 01/13/2011, 12/30/2011, 02/01/2013   Influenza Whole 12/21/2008, 01/12/2010    Influenza, High Dose Seasonal PF 12/09/2013, 12/11/2015, 12/24/2016, 01/01/2018   Influenza,inj,Quad PF,6+ Mos 01/23/2015   PFIZER(Purple Top)SARS-COV-2 Vaccination 04/23/2019, 05/14/2019, 07/21/2020, 12/26/2020, 01/08/2021   Pneumococcal Conjugate-13 04/21/2014   Pneumococcal Polysaccharide-23 04/15/2018   Pneumococcal-Unspecified 02/25/2005    TDAP status: Due, Education has been provided regarding the importance of this vaccine. Advised may receive this vaccine at local pharmacy or Health Dept. Aware to provide a copy of the vaccination record if obtained from local pharmacy or Health Dept. Verbalized acceptance and understanding.  Flu Vaccine status: Up to date  Pneumococcal vaccine status: Up to date  Covid-19 vaccine status: Completed vaccines  Qualifies for Shingles Vaccine? Yes   Zostavax completed No   Shingrix Completed?: No.    Education has been provided regarding the importance of this vaccine. Patient has been advised to call insurance company to determine out of pocket expense if they have not yet received this vaccine. Advised may also receive vaccine at local pharmacy or Health Dept. Verbalized acceptance and understanding.  Screening Tests Health Maintenance  Topic Date Due   TETANUS/TDAP  Never done   FOOT EXAM  02/26/2019   HEMOGLOBIN A1C  02/15/2021   COVID-19 Vaccine (5 - Booster for Pfizer series) 03/05/2021   OPHTHALMOLOGY EXAM  07/23/2021   Pneumonia Vaccine 70+ Years old  Completed   INFLUENZA VACCINE  Completed   HPV VACCINES  Aged Out   DEXA SCAN  Discontinued   Zoster Vaccines- Shingrix  Discontinued    Health Maintenance  Health Maintenance Due  Topic Date  Due   TETANUS/TDAP  Never done   FOOT EXAM  02/26/2019   HEMOGLOBIN A1C  02/15/2021   COVID-19 Vaccine (5 - Booster for Pfizer series) 03/05/2021    Colorectal cancer screening: No longer required.   Mammogram status: No longer required due to age.  Bone density status: never  done/discontinued  Lung Cancer Screening: (Low Dose CT Chest recommended if Age 20-80 years, 30 pack-year currently smoking OR have quit w/in 15years.) does not qualify.   Lung Cancer Screening Referral: no  Additional Screening:  Hepatitis C Screening: does not qualify; Completed no  Vision Screening: Recommended annual ophthalmology exams for early detection of glaucoma and other disorders of the eye. Is the patient up to date with their annual eye exam?  Yes  Who is the provider or what is the name of the office in which the patient attends annual eye exams? [facility] If pt is not established with a provider, would they like to be referred to a provider to establish care? No .   Dental Screening: Recommended annual dental exams for proper oral hygiene  Community Resource Referral / Chronic Care Management: CRR required this visit?  No   CCM required this visit?  No      Plan:     I have personally reviewed and noted the following in the patient's chart:   Medical and social history Use of alcohol, tobacco or illicit drugs  Current medications and supplements including opioid prescriptions.  Functional ability and status Nutritional status Physical activity Advanced directives List of other physicians Hospitalizations, surgeries, and ER visits in previous 12 months Vitals Screenings to include cognitive, depression, and falls Referrals and appointments  In addition, I have reviewed and discussed with patient certain preventive protocols, quality metrics, and best practice recommendations. A written personalized care plan for preventive services as well as general preventive health recommendations were provided to patient.     Sheral Flow, LPN   [id number]   Nurse Notes:  Hearing Screening - Comments:: Patient has bilateral hearing loss.  Wears hearing aids. Vision Screening - Comments:: Patient wears eyeglasses.  Eye exam done annually by  [facility].

## 2021-03-04 NOTE — Patient Instructions (Addendum)
Kayla Davis , Thank you for taking time to come for your Medicare Wellness Visit. I appreciate your ongoing commitment to your health goals. Please review the following plan we discussed and let me know if I can assist you in the future.   Screening recommendations/referrals: Colonoscopy: Not a candidate for screening due to age 87: Not a candidate for screening due to age Bone Density: Not a candidate for screening due to age Recommended yearly ophthalmology/optometry visit for glaucoma screening and checkup Recommended yearly dental visit for hygiene and checkup  Vaccinations: Influenza vaccine: 12/26/2020 Pneumococcal vaccine: 04/21/2014, 04/15/2018 Tdap vaccine: declined Covid-19: 04/23/2019, 05/14/2019, 01/09/2020, 07/21/2020, 12/26/2020  Advanced directives: Please bring a copy of your health care power of attorney and living will to the office at your convenience.  Conditions/risks identified: Yes; My goal is to keep on walking.  Next appointment: Please schedule your next Medicare Wellness Visit with your Nurse Health Advisor in 1 year by calling [phone].   Preventive Care 39 Years and Older, Female Preventive care refers to lifestyle choices and visits with your health care provider that can promote health and wellness. What does preventive care include? A yearly physical exam. This is also called an annual well check. Dental exams once or twice a year. Routine eye exams. Ask your health care provider how often you should have your eyes checked. Personal lifestyle choices, including: Daily care of your teeth and gums. Regular physical activity. Eating a healthy diet. Avoiding tobacco and drug use. Limiting alcohol use. Practicing safe sex. Taking low-dose aspirin every day. Taking vitamin and mineral supplements as recommended by your health care provider. What happens during an annual well check? The services and screenings done by your health care provider during  your annual well check will depend on your age, overall health, lifestyle risk factors, and family history of disease. Counseling  Your health care provider may ask you questions about your: Alcohol use. Tobacco use. Drug use. Emotional well-being. Home and relationship well-being. Sexual activity. Eating habits. History of falls. Memory and ability to understand (cognition). Work and work Statistician. Reproductive health. Screening  You may have the following tests or measurements: Height, weight, and BMI. Blood pressure. Lipid and cholesterol levels. These may be checked every 5 years, or more frequently if you are over 82 years old. Skin check. Lung cancer screening. You may have this screening every year starting at age 65 if you have a 30-pack-year history of smoking and currently smoke or have quit within the past 15 years. Fecal occult blood test (FOBT) of the stool. You may have this test every year starting at age 48. Flexible sigmoidoscopy or colonoscopy. You may have a sigmoidoscopy every 5 years or a colonoscopy every 10 years starting at age 72. Hepatitis C blood test. Hepatitis B blood test. Sexually transmitted disease (STD) testing. Diabetes screening. This is done by checking your blood sugar (glucose) after you have not eaten for a while (fasting). You may have this done every 1-3 years. Bone density scan. This is done to screen for osteoporosis. You may have this done starting at age 32. Mammogram. This may be done every 1-2 years. Talk to your health care provider about how often you should have regular mammograms. Talk with your health care provider about your test results, treatment options, and if necessary, the need for more tests. Vaccines  Your health care provider may recommend certain vaccines, such as: Influenza vaccine. This is recommended every year. Tetanus, diphtheria, and acellular pertussis (Tdap,  Td) vaccine. You may need a Td booster every 10  years. Zoster vaccine. You may need this after age 65. Pneumococcal 13-valent conjugate (PCV13) vaccine. One dose is recommended after age 3. Pneumococcal polysaccharide (PPSV23) vaccine. One dose is recommended after age 59. Talk to your health care provider about which screenings and vaccines you need and how often you need them. This information is not intended to replace advice given to you by your health care provider. Make sure you discuss any questions you have with your health care provider. Document Released: 04/27/2015 Document Revised: 12/19/2015 Document Reviewed: 01/30/2015 Elsevier Interactive Patient Education  2017 [place] Prevention in the Home Falls can cause injuries. They can happen to people of all ages. There are many things you can do to make your home safe and to help prevent falls. What can I do on the outside of my home? Regularly fix the edges of walkways and driveways and fix any cracks. Remove anything that might make you trip as you walk through a door, such as a raised step or threshold. Trim any bushes or trees on the path to your home. Use bright outdoor lighting. Clear any walking paths of anything that might make someone trip, such as rocks or tools. Regularly check to see if handrails are loose or broken. Make sure that both sides of any steps have handrails. Any raised decks and porches should have guardrails on the edges. Have any leaves, snow, or ice cleared regularly. Use sand or salt on walking paths during winter. Clean up any spills in your garage right away. This includes oil or grease spills. What can I do in the bathroom? Use night lights. Install grab bars by the toilet and in the tub and shower. Do not use towel bars as grab bars. Use non-skid mats or decals in the tub or shower. If you need to sit down in the shower, use a plastic, non-slip stool. Keep the floor dry. Clean up any water that spills on the floor as soon as it  happens. Remove soap buildup in the tub or shower regularly. Attach bath mats securely with double-sided non-slip rug tape. Do not have throw rugs and other things on the floor that can make you trip. What can I do in the bedroom? Use night lights. Make sure that you have a light by your bed that is easy to reach. Do not use any sheets or blankets that are too big for your bed. They should not hang down onto the floor. Have a firm chair that has side arms. You can use this for support while you get dressed. Do not have throw rugs and other things on the floor that can make you trip. What can I do in the kitchen? Clean up any spills right away. Avoid walking on wet floors. Keep items that you use a lot in easy-to-reach places. If you need to reach something above you, use a strong step stool that has a grab bar. Keep electrical cords out of the way. Do not use floor polish or wax that makes floors slippery. If you must use wax, use non-skid floor wax. Do not have throw rugs and other things on the floor that can make you trip. What can I do with my stairs? Do not leave any items on the stairs. Make sure that there are handrails on both sides of the stairs and use them. Fix handrails that are broken or loose. Make sure that handrails are as  long as the stairways. Check any carpeting to make sure that it is firmly attached to the stairs. Fix any carpet that is loose or worn. Avoid having throw rugs at the top or bottom of the stairs. If you do have throw rugs, attach them to the floor with carpet tape. Make sure that you have a light switch at the top of the stairs and the bottom of the stairs. If you do not have them, ask someone to add them for you. What else can I do to help prevent falls? Wear shoes that: Do not have high heels. Have rubber bottoms. Are comfortable and fit you well. Are closed at the toe. Do not wear sandals. If you use a stepladder: Make sure that it is fully opened.  Do not climb a closed stepladder. Make sure that both sides of the stepladder are locked into place. Ask someone to hold it for you, if possible. Clearly mark and make sure that you can see: Any grab bars or handrails. First and last steps. Where the edge of each step is. Use tools that help you move around (mobility aids) if they are needed. These include: Canes. Walkers. Scooters. Crutches. Turn on the lights when you go into a dark area. Replace any light bulbs as soon as they burn out. Set up your furniture so you have a clear path. Avoid moving your furniture around. If any of your floors are uneven, fix them. If there are any pets around you, be aware of where they are. Review your medicines with your doctor. Some medicines can make you feel dizzy. This can increase your chance of falling. Ask your doctor what other things that you can do to help prevent falls. This information is not intended to replace advice given to you by your health care provider. Make sure you discuss any questions you have with your health care provider. Document Released: 01/25/2009 Document Revised: 09/06/2015 Document Reviewed: 05/05/2014 Elsevier Interactive Patient Education  2017 [employer].

## 2021-03-05 ENCOUNTER — Encounter: Payer: Self-pay | Admitting: Internal Medicine

## 2021-03-05 ENCOUNTER — Ambulatory Visit (INDEPENDENT_AMBULATORY_CARE_PROVIDER_SITE_OTHER): Payer: MEDICARE | Admitting: Internal Medicine

## 2021-03-05 VITALS — BP 128/68 | HR 76 | Temp 98.4°F | Resp 16 | Ht 65.0 in | Wt 157.0 lb

## 2021-03-05 DIAGNOSIS — I10 Essential (primary) hypertension: Secondary | ICD-10-CM

## 2021-03-05 DIAGNOSIS — N1832 Chronic kidney disease, stage 3b: Secondary | ICD-10-CM

## 2021-03-05 DIAGNOSIS — R10815 Periumbilic abdominal tenderness: Secondary | ICD-10-CM

## 2021-03-05 DIAGNOSIS — E118 Type 2 diabetes mellitus with unspecified complications: Secondary | ICD-10-CM | POA: Diagnosis not present

## 2021-03-05 DIAGNOSIS — K625 Hemorrhage of anus and rectum: Secondary | ICD-10-CM

## 2021-03-05 DIAGNOSIS — N3000 Acute cystitis without hematuria: Secondary | ICD-10-CM | POA: Diagnosis not present

## 2021-03-05 DIAGNOSIS — E034 Atrophy of thyroid (acquired): Secondary | ICD-10-CM

## 2021-03-05 DIAGNOSIS — R040 Epistaxis: Secondary | ICD-10-CM

## 2021-03-05 DIAGNOSIS — D539 Nutritional anemia, unspecified: Secondary | ICD-10-CM | POA: Diagnosis not present

## 2021-03-05 LAB — CBC WITH DIFFERENTIAL/PLATELET
Basophils Absolute: 0.1 10*3/uL (ref 0.0–0.1)
Basophils Relative: 1.5 % (ref 0.0–3.0)
Eosinophils Absolute: 0.9 10*3/uL — ABNORMAL HIGH (ref 0.0–0.7)
Eosinophils Relative: 10.6 % — ABNORMAL HIGH (ref 0.0–5.0)
HCT: 36.2 % (ref 36.0–46.0)
Hemoglobin: 11.9 g/dL — ABNORMAL LOW (ref 12.0–15.0)
Lymphocytes Relative: 28.2 % (ref 12.0–46.0)
Lymphs Abs: 2.3 10*3/uL (ref 0.7–4.0)
MCHC: 32.9 g/dL (ref 30.0–36.0)
MCV: 95.8 fl (ref 78.0–100.0)
Monocytes Absolute: 0.8 10*3/uL (ref 0.1–1.0)
Monocytes Relative: 10.2 % (ref 3.0–12.0)
Neutro Abs: 4 10*3/uL (ref 1.4–7.7)
Neutrophils Relative %: 49.5 % (ref 43.0–77.0)
Platelets: 174 10*3/uL (ref 150.0–400.0)
RBC: 3.78 Mil/uL — ABNORMAL LOW (ref 3.87–5.11)
RDW: 13.5 % (ref 11.5–15.5)
WBC: 8.1 10*3/uL (ref 4.0–10.5)

## 2021-03-05 LAB — HEPATIC FUNCTION PANEL
ALT: 15 U/L (ref 0–35)
AST: 22 U/L (ref 0–37)
Albumin: 4.2 g/dL (ref 3.5–5.2)
Alkaline Phosphatase: 66 U/L (ref 39–117)
Bilirubin, Direct: 0.1 mg/dL (ref 0.0–0.3)
Total Bilirubin: 0.5 mg/dL (ref 0.2–1.2)
Total Protein: 6.6 g/dL (ref 6.0–8.3)

## 2021-03-05 LAB — IBC + FERRITIN
Ferritin: 36.9 ng/mL (ref 10.0–291.0)
Iron: 96 ug/dL (ref 42–145)
Saturation Ratios: 27 % (ref 20.0–50.0)
TIBC: 355.6 ug/dL (ref 250.0–450.0)
Transferrin: 254 mg/dL (ref 212.0–360.0)

## 2021-03-05 LAB — BASIC METABOLIC PANEL
BUN: 21 mg/dL (ref 6–23)
CO2: 31 mEq/L (ref 19–32)
Calcium: 9.4 mg/dL (ref 8.4–10.5)
Chloride: 98 mEq/L (ref 96–112)
Creatinine, Ser: 1.07 mg/dL (ref 0.40–1.20)
GFR: 43.93 mL/min — ABNORMAL LOW (ref >60.00)
Glucose, Bld: 74 mg/dL (ref 70–99)
Potassium: 4.4 mEq/L (ref 3.5–5.1)
Sodium: 136 mEq/L (ref 135–145)

## 2021-03-05 LAB — PROTIME-INR
INR: 1.1 ratio — ABNORMAL HIGH (ref 0.8–1.0)
Prothrombin Time: 11.8 s (ref 9.6–13.1)

## 2021-03-05 LAB — HEMOGLOBIN A1C: Hgb A1c MFr Bld: 6.6 % — ABNORMAL HIGH (ref 4.6–6.5)

## 2021-03-05 LAB — APTT: aPTT: 28.1 s (ref 23.4–32.7)

## 2021-03-05 LAB — VITAMIN B12: Vitamin B-12: >1550 pg/mL — ABNORMAL HIGH (ref 211–911)

## 2021-03-05 LAB — TSH: TSH: 11.73 u[IU]/mL — ABNORMAL HIGH (ref 0.35–5.50)

## 2021-03-05 MED ORDER — LEVOTHYROXINE SODIUM 112 MCG PO TABS: 112.0000 ug | ORAL_TABLET | Freq: Every day | ORAL | 0 refills | Status: DC

## 2021-03-05 NOTE — Progress Notes (Signed)
Subjective:  Patient ID: Kayla Davis, female    DOB: Jan 27, 1923  Age: 87 y.o. MRN: [id number]  CC: Anemia, Hypothyroidism, and Urinary Tract Infection  This visit occurred during the SARS-CoV-2 public health emergency.  Safety protocols were in place, including screening questions prior to the visit, additional usage of staff PPE, and extensive cleaning of exam room while observing appropriate contact time as indicated for disinfecting solutions.     HPI Kayla Davis presents for f/up - new to me.  She had a Medicare wellness visit 1 day prior to this visit.  She reported UTI symptoms so I ordered a urinalysis.  The urinalysis was consistent with infection.  I prescribed nitrofurantoin but her son tells me she is not taking it.  She has a history of recurrent UTIs.  For several months she has had intermittent abdominal pain.  She is also noticing blood in her underwear and on her pants.  She does not think it is coming from her urine.  She has seen blood in her stool.  She has chronic, unchanged low back pain.  Outpatient Medications Prior to Visit  Medication Sig Dispense Refill   ALPHA LIPOIC ACID PO Take 1 tablet by mouth daily.     cetirizine (ZYRTEC) 10 MG tablet Take 10 mg by mouth at bedtime.     Coenzyme Q10 (CO Q 10 PO) Take 1 tablet by mouth daily.     CRANBERRY PO Take 3 tablets by mouth daily with supper.      Cyanocobalamin (VITAMIN B-12 PO) Take 1 tablet by mouth at bedtime.     Docusate Calcium (STOOL SOFTENER PO) Take 1 capsule by mouth at bedtime.      estradiol (ESTRACE) 0.1 MG/GM vaginal cream PV as directed 42.5 g 3   EVENING PRIMROSE OIL PO Take 1 capsule by mouth daily.     furosemide (LASIX) 20 MG tablet Take 20 mg by mouth daily as needed (NO MORE THAN 1 TO 2 TIMES  A WEEK).     hydrochlorothiazide (MICROZIDE) 12.5 MG capsule Take 12.5 mg by mouth daily.     ipratropium (ATROVENT) 0.06 % nasal spray Place 2 sprays into both nostrils 3 (three) times daily.  45 mL 3   Lancets (ONETOUCH ULTRASOFT) lancets Use to check blood sugars once a day Dx E11.9 100 each 3   losartan (COZAAR) 50 MG tablet Take 1 tablet (50 mg total) by mouth daily. 90 tablet 1   metoprolol tartrate (LOPRESSOR) 25 MG tablet TAKE 1 TABLET BY MOUTH TWICE A DAY 180 tablet 3   Multiple Vitamins-Minerals (EYE VITAMINS) CAPS Take 1 capsule by mouth daily.     nitrofurantoin, macrocrystal-monohydrate, (MACROBID) 100 MG capsule Take 1 capsule (100 mg total) by mouth 2 (two) times daily for 5 days. 10 capsule 0   ONETOUCH ULTRA test strip USE TO CHECK BLOOD SUGAR TWICE DAILY 100 strip 12   polyethylene glycol (MIRALAX / GLYCOLAX) packet Take 17 g by mouth daily as needed for mild constipation.     pregabalin (LYRICA) 150 MG capsule Take 1 capsule (150 mg total) by mouth at bedtime. 30 capsule 3   simvastatin (ZOCOR) 40 MG tablet TAKE 1 TABLET DAILY 90 tablet 3   aspirin 81 MG EC tablet TAKE 1 TABLET BY MOUTH EVERY DAY 90 tablet 3   levothyroxine (SYNTHROID) 100 MCG tablet TAKE 1 TABLET DAILY BEFORE BREAKFAST 90 tablet 3   No facility-administered medications prior to visit.    ROS Review  of Systems  Constitutional:  Positive for fatigue. Negative for appetite change and diaphoresis.  HENT:  Positive for nosebleeds. Negative for rhinorrhea.   Respiratory: Negative.  Negative for cough, chest tightness, shortness of breath and wheezing.   Cardiovascular:  Negative for chest pain, palpitations and leg swelling.  Gastrointestinal:  Positive for abdominal pain, anal bleeding and blood in stool. Negative for constipation, diarrhea, nausea, rectal pain and vomiting.  Genitourinary:  Positive for dysuria. Negative for difficulty urinating, flank pain and hematuria.  Musculoskeletal:  Positive for back pain and neck pain.  Skin: Negative.  Negative for color change.  Neurological:  Negative for dizziness, weakness, light-headedness and headaches.  Hematological:  Negative for adenopathy.  Bruises/bleeds easily.  Psychiatric/Behavioral: Negative.     Objective:  BP 128/68 (BP Location: Left Arm, Patient Position: Sitting, Cuff Size: Normal)   Pulse 76   Temp 98.4 F (36.9 C) (Oral)   Resp 16   Ht 5\' 5"  (1.651 m)   Wt 157 lb (71.2 kg)   LMP  (LMP Unknown)   SpO2 96%   BMI 26.13 kg/m   BP Readings from Last 3 Encounters:  03/05/21 128/68  03/04/21 118/60  11/19/20 (!) 142/82    Wt Readings from Last 3 Encounters:  03/05/21 157 lb (71.2 kg)  03/04/21 157 lb (71.2 kg)  11/19/20 157 lb 12.8 oz (71.6 kg)    Physical Exam Vitals reviewed. Exam conducted with a chaperone present (her son).  Constitutional:      Appearance: Normal appearance. She is not ill-appearing.  HENT:     Nose: Nose normal.     Mouth/Throat:     Mouth: Mucous membranes are moist.  Eyes:     Conjunctiva/sclera: Conjunctivae normal.  Cardiovascular:     Rate and Rhythm: Normal rate and regular rhythm.     Heart sounds: Murmur heard.  Systolic murmur is present with a grade of 2/6.  No diastolic murmur is present.    No friction rub. No gallop.     Comments: 2/6 SEM RUSB Pulmonary:     Effort: Pulmonary effort is normal.     Breath sounds: No stridor. No wheezing, rhonchi or rales.  Abdominal:     General: Abdomen is flat. Bowel sounds are normal. There is no distension.     Palpations: Abdomen is soft. There is no fluid wave, hepatomegaly, splenomegaly or mass.     Tenderness: There is abdominal tenderness in the periumbilical area.     Hernia: No hernia is present.  Genitourinary:    Rectum: Normal. Guaiac result negative. No mass, tenderness, anal fissure, external hemorrhoid or internal hemorrhoid. Normal anal tone.  Musculoskeletal:     Right lower leg: No edema.     Left lower leg: No edema.  Neurological:     Mental Status: She is alert.    Lab Results  Component Value Date   WBC 8.1 03/05/2021   HGB 11.9 (L) 03/05/2021   HCT 36.2 03/05/2021   PLT 174.0 03/05/2021    GLUCOSE 74 03/05/2021   CHOL 162 12/24/2016   TRIG 97.0 12/24/2016   HDL 80.70 12/24/2016   LDLDIRECT 143.0 10/07/2007   LDLCALC 62 12/24/2016   ALT 15 03/05/2021   AST 22 03/05/2021   NA 136 03/05/2021   K 4.4 03/05/2021   CL 98 03/05/2021   CREATININE 1.07 03/05/2021   BUN 21 03/05/2021   CO2 31 03/05/2021   TSH 11.73 (H) 03/05/2021   INR 1.1 (H) 03/05/2021  HGBA1C 6.6 (H) 03/05/2021    CT LUMBAR SPINE WO CONTRAST  Result Date: 04/25/2020 CLINICAL DATA:  Fall 1 month ago.  Back pain. EXAM: CT LUMBAR SPINE WITHOUT CONTRAST TECHNIQUE: Multidetector CT imaging of the lumbar spine was performed without intravenous contrast administration. Multiplanar CT image reconstructions were also generated. COMPARISON:  CT lumbar spine 03/15/2017 FINDINGS: Segmentation: Normal Alignment: Mild retrolisthesis L1-2, L2-3, L3-4. 10 mm anterolisthesis L5-S1 unchanged. Lumbar scoliosis unchanged. Vertebrae: Superior endplate fracture of L1 with mild progression since the prior study. The fracture. Acute on the prior study. Mild superior endplate fracture of A25 appears chronic and unchanged. Mild superior endplate fracture of L3 is unchanged. No acute fracture or mass lesion. Nonobstructing left renal calculi. Paraspinal and other soft tissues: Atherosclerotic calcification in the aorta and iliac arteries. Abdominal aorta nonaneurysmal measuring up to 21 mm in diameter. No paraspinous mass or adenopathy. Sigmoid diverticulosis. Disc levels: T12-L1: Mild disc degeneration.  Negative for stenosis L1-2: Moderate disc degeneration with disc space narrowing and gas in the disc space. Diffuse disc bulging. Negative for stenosis L2-3: Disc degeneration with diffuse disc bulging and endplate spurring. Negative for spinal or foraminal stenosis. Mild left subarticular stenosis. L3-4: Disc degeneration with disc space narrowing and extensive gas in the disc space. Disc degeneration asymmetric to the left. Associated spurring  asymmetric to the left. Bilateral facet and ligamentum flavum hypertrophy. Moderate spinal stenosis. Moderate subarticular and foraminal stenosis on the left. No change from the prior study. L4-5: Disc degeneration asymmetric to the right with disc space narrowing, gas in the disc space, and asymmetric spurring to the right. Facet and ligamentum flavum hypertrophy. Mild to moderate spinal stenosis. Moderate subarticular and foraminal stenosis on the right. No interval change. L5-S1: 10 mm anterolisthesis with chronic bilateral pars defects of L5. Bilateral L5 nerve root impingement in the foramen unchanged from the prior study. IMPRESSION: Advanced multilevel disc and facet degeneration throughout the lumbar spine. Multilevel spinal and foraminal stenosis similar to the prior CT lumbar spine 03/16/2007 Chronic compression fractures of T12, L1, L3.  No acute fracture. Aortic Atherosclerosis (ICD10-I70.0). Electronically Signed   By: Franchot Gallo M.D.   On: 04/25/2020 12:57   IMPRESSION: No acute large or medium vessel occlusion.   Aortic atherosclerosis without aneurysm or dissection.   Right carotid bifurcation widely patent status post previous carotid endarterectomy. Atherosclerotic disease at the left carotid bifurcation and ICA bulb. Maximal stenosis of the ICA bulb is 20%.   Plaque at both vertebral artery origins with narrowing estimated at 30%.   Calcified plaque in both carotid siphons but without stenosis greater than 30%.   30-50% atherosclerotic narrowing of both vertebral artery V4 segments. Both vessels do supply the basilar.   Advanced arthropathy at the C1-2 articulation with prominent pannus, resulting in stenosis at the C1 level and foramen magnum and apparent compressive effect upon the cervicomedullary junction. This may have worsened slightly since the previous exam.     Electronically Signed   By: Nelson Chimes M.D.   On: 04/30/2019 16:47  Component 2 d ago    Source: NOT GIVEN   Status: FINAL   Isolate 1: Aerococcus species Abnormal    Comment: 50,000-100,000 CFU/mL of Aerococcus species Susceptibility testing not routinely performed on this isolate.  Resulting Agency [facility] [place]     Assessment & Plan:   Kayla Davis was seen today for anemia, hypothyroidism and urinary tract infection.  Diagnoses and all orders for this visit:  Deficiency anemia- Her H&H have  improved.  I will screen for vitamin deficiencies. -     CBC with Differential/Platelet; Future -     IBC + Ferritin; Future -     Vitamin B12; Future -     Hepatic function panel; Future -     Hepatic function panel -     Vitamin B12 -     IBC + Ferritin -     CBC with Differential/Platelet  BRBPR (bright red blood per rectum)- The work-up for coagulopathy is unremarkable.  I have asked her to speak to her cardiologist to see if she can stop taking aspirin. -     Protime-INR; Future -     APTT; Future -     APTT -     Protime-INR  Frequent nosebleeds- See above. -     Hepatic function panel; Future -     Protime-INR; Future -     APTT; Future -     APTT -     Protime-INR -     Hepatic function panel  Type II diabetes mellitus with manifestations ([place])- Her blood sugar is adequately well controlled. -     Basic metabolic panel; Future -     Hepatic function panel; Future -     Hemoglobin A1c; Future -     Hemoglobin A1c -     Hepatic function panel -     Basic metabolic panel  Essential hypertension-her blood pressure is well controlled. -     TSH; Future -     TSH  Hypothyroidism due to acquired atrophy of thyroid- Her TSH is up to nearly 12.  I recommended that she increase her dose of T4. -     TSH; Future -     TSH -     levothyroxine (SYNTHROID) 112 MCG tablet; Take 1 tablet (112 mcg total) by mouth daily.  Stage 3b chronic kidney disease ([place])- She will avoid nephrotoxic agents.  Periumbilical abdominal tenderness without rebound  tenderness- I recommended a CT scan of the abdomen with contrast to screen for mass, infection, and malignancy. -     CT Abdomen Pelvis W Contrast; Future  Acute cystitis without hematuria- She will start taking nitrofurantoin.  I have discontinued Kayla Rex. Davis's levothyroxine. I am also having her start on levothyroxine. Additionally, I am having her maintain her ALPHA LIPOIC ACID PO, EVENING PRIMROSE OIL PO, Eye Vitamins, CRANBERRY PO, Docusate Calcium (STOOL SOFTENER PO), Cyanocobalamin (VITAMIN B-12 PO), polyethylene glycol, onetouch ultrasoft, Coenzyme Q10 (CO Q 10 PO), cetirizine, ipratropium, estradiol, OneTouch Ultra, metoprolol tartrate, furosemide, hydrochlorothiazide, pregabalin, losartan, and simvastatin.  Meds ordered this encounter  Medications   levothyroxine (SYNTHROID) 112 MCG tablet    Sig: Take 1 tablet (112 mcg total) by mouth daily.    Dispense:  90 tablet    Refill:  0      Follow-up: Return in about 4 weeks (around 04/02/2021).  Scarlette Calico, MD

## 2021-03-05 NOTE — Patient Instructions (Signed)
Anemia Anemia is a condition in which there is not enough red blood cells or hemoglobin in the blood. Hemoglobin is a substance in red blood cells that carries oxygen. When you do not have enough red blood cells or hemoglobin (are anemic), your body cannot get enough oxygen and your organs may not work properly. As a result, you may feel very tired or have other problems. What are the causes? Common causes of anemia include: Excessive bleeding. Anemia can be caused by excessive bleeding inside or outside the body, including bleeding from the intestines or from heavy menstrual periods in females. Poor nutrition. Long-lasting (chronic) kidney, thyroid, and liver disease. Bone marrow disorders, spleen problems, and blood disorders. Cancer and treatments for cancer. HIV (human immunodeficiency virus) and AIDS (acquired immunodeficiency syndrome). Infections, medicines, and autoimmune disorders that destroy red blood cells. What are the signs or symptoms? Symptoms of this condition include: Minor weakness. Dizziness. Headache, or difficulties concentrating and sleeping. Heartbeats that feel irregular or faster than normal (palpitations). Shortness of breath, especially with exercise. Pale skin, lips, and nails, or cold hands and feet. Indigestion and nausea. Symptoms may occur suddenly or develop slowly. If your anemia is mild, you may not have symptoms. How is this diagnosed? This condition is diagnosed based on blood tests, your medical history, and a physical exam. In some cases, a test may be needed in which cells are removed from the soft tissue inside of a bone and looked at under a microscope (bone marrow biopsy). Your health care provider may also check your stool (feces) for blood and may do additional testing to look for the cause of your bleeding. Other tests may include: Imaging tests, such as a CT scan or MRI. A procedure to see inside your esophagus and stomach (endoscopy). A  procedure to see inside your colon and rectum (colonoscopy). How is this treated? Treatment for this condition depends on the cause. If you continue to lose a lot of blood, you may need to be treated at a hospital. Treatment may include: Taking supplements of iron, vitamin B12, or folic acid. Taking a hormone medicine (erythropoietin) that can help to stimulate red blood cell growth. Having a blood transfusion. This may be needed if you lose a lot of blood. Making changes to your diet. Having surgery to remove your spleen. Follow these instructions at home: Take over-the-counter and prescription medicines only as told by your health care provider. Take supplements only as told by your health care provider. Follow any diet instructions that you were given by your health care provider. Keep all follow-up visits as told by your health care provider. This is important. Contact a health care provider if: You develop new bleeding anywhere in the body. Get help right away if: You are very weak. You are short of breath. You have pain in your abdomen or chest. You are dizzy or feel faint. You have trouble concentrating. You have bloody stools, black stools, or tarry stools. You vomit repeatedly or you vomit up blood. These symptoms may represent a serious problem that is an emergency. Do not wait to see if the symptoms will go away. Get medical help right away. Call your local emergency services (911 in the [place] Do not drive yourself to the hospital. Summary Anemia is a condition in which you do not have enough red blood cells or enough of a substance in your red blood cells that carries oxygen (hemoglobin). Symptoms may occur suddenly or develop slowly. If your anemia is   mild, you may not have symptoms. This condition is diagnosed with blood tests, a medical history, and a physical exam. Other tests may be needed. Treatment for this condition depends on the cause of the anemia. This  information is not intended to replace advice given to you by your health care provider. Make sure you discuss any questions you have with your health care provider. Document Revised: 03/08/2019 Document Reviewed: 03/08/2019 Elsevier Patient Education  2022 [employer]  

## 2021-03-06 ENCOUNTER — Telehealth: Payer: Self-pay | Admitting: Internal Medicine

## 2021-03-06 DIAGNOSIS — N1832 Chronic kidney disease, stage 3b: Secondary | ICD-10-CM | POA: Insufficient documentation

## 2021-03-06 LAB — CULTURE, URINE COMPREHENSIVE

## 2021-03-06 NOTE — Telephone Encounter (Signed)
Patient called in regarding results  She said she is okay w/ having the CT done   Please let her know when this has been done so she can schedule appt

## 2021-03-06 NOTE — Telephone Encounter (Signed)
Pt is going for a ct scan soon and Provider is needing to know if the pt is able to come off of her baby Asprin prior to... pt will advise how long she needs to come off when you all reach out.

## 2021-03-06 NOTE — Telephone Encounter (Signed)
Pt saw Dr. Ronnald Ramp will forward to him to address.Marland KitchenJohny Chess

## 2021-03-06 NOTE — Telephone Encounter (Signed)
Advised the patient that Dr. [name] is okay with stopping Aspirin 81 mg all together.  Patient verbalized understanding.

## 2021-03-11 DIAGNOSIS — R10815 Periumbilic abdominal tenderness: Secondary | ICD-10-CM | POA: Insufficient documentation

## 2021-03-15 ENCOUNTER — Telehealth: Payer: Self-pay | Admitting: Internal Medicine

## 2021-03-15 DIAGNOSIS — K625 Hemorrhage of anus and rectum: Secondary | ICD-10-CM

## 2021-03-15 NOTE — Telephone Encounter (Signed)
Patient calling in  Patient says everytime she goes to have a BM she is bleeding from the rectum  Wants provider to put referral in to [facility] to have a colon cancer screening  Please call patient once referral has been placed [phone]

## 2021-03-17 NOTE — Telephone Encounter (Signed)
Okay.  Thanks.

## 2021-03-18 ENCOUNTER — Encounter: Payer: Self-pay | Admitting: Internal Medicine

## 2021-03-18 ENCOUNTER — Ambulatory Visit
Admission: RE | Admit: 2021-03-18 | Discharge: 2021-03-18 | Disposition: A | Discharge status: Home / Self Care | Payer: MEDICARE | Source: Ambulatory Visit | Attending: Internal Medicine | Admitting: Internal Medicine

## 2021-03-18 ENCOUNTER — Other Ambulatory Visit: Payer: Self-pay | Admitting: Internal Medicine

## 2021-03-18 DIAGNOSIS — M4856XA Collapsed vertebra, not elsewhere classified, lumbar region, initial encounter for fracture: Secondary | ICD-10-CM | POA: Diagnosis not present

## 2021-03-18 DIAGNOSIS — K5730 Diverticulosis of large intestine without perforation or abscess without bleeding: Secondary | ICD-10-CM | POA: Diagnosis not present

## 2021-03-18 DIAGNOSIS — R10815 Periumbilic abdominal tenderness: Secondary | ICD-10-CM

## 2021-03-18 DIAGNOSIS — K429 Umbilical hernia without obstruction or gangrene: Secondary | ICD-10-CM | POA: Diagnosis not present

## 2021-03-18 DIAGNOSIS — M4316 Spondylolisthesis, lumbar region: Secondary | ICD-10-CM | POA: Diagnosis not present

## 2021-03-18 DIAGNOSIS — K625 Hemorrhage of anus and rectum: Secondary | ICD-10-CM

## 2021-03-18 MED ORDER — IOPAMIDOL (ISOVUE-300) INJECTION 61%
100.0000 mL | Freq: Once | INTRAVENOUS | Status: AC | PRN
  Administered 2021-03-18: 100 mL via INTRAVENOUS

## 2021-03-18 NOTE — Telephone Encounter (Signed)
Patient's son Kayla Davis requesting a call back to discuss next steps in patient's rectum bleeding  Informed caller referral has been made to gastro, caller state he is not aware of a request for a referral being made  Please advise

## 2021-03-18 NOTE — Telephone Encounter (Signed)
Son sent my chart msg pertaining to the question below. Replied back via mychart stating MD placed referral to GI. Gave GI number to call to check status.Marland KitchenJohny Davis

## 2021-03-20 ENCOUNTER — Other Ambulatory Visit: Payer: Self-pay | Admitting: Internal Medicine

## 2021-03-21 ENCOUNTER — Encounter: Payer: Self-pay | Admitting: Gastroenterology

## 2021-04-01 ENCOUNTER — Telehealth: Payer: Self-pay | Admitting: Internal Medicine

## 2021-04-01 NOTE — Telephone Encounter (Signed)
Patient requesting a call back for labs results done on 11-22  Please call patient after 1pm

## 2021-04-01 NOTE — Telephone Encounter (Signed)
Called pt back she waned to go over her UA test. Wanted to know what Leukocytes was,and how much bacteria was in her urine. Inform pt the nitrofurantoin that she was taking should have cleared everything up. Ask pt was she still having urine problem and she states no.Marland KitchenJohny Chess

## 2021-04-09 ENCOUNTER — Encounter: Payer: Self-pay | Admitting: Internal Medicine

## 2021-04-09 ENCOUNTER — Ambulatory Visit (INDEPENDENT_AMBULATORY_CARE_PROVIDER_SITE_OTHER): Payer: MEDICARE | Admitting: Internal Medicine

## 2021-04-09 ENCOUNTER — Other Ambulatory Visit: Payer: Self-pay

## 2021-04-09 VITALS — BP 108/56 | HR 60 | Ht 65.0 in | Wt 155.0 lb

## 2021-04-09 DIAGNOSIS — R0602 Shortness of breath: Secondary | ICD-10-CM

## 2021-04-09 DIAGNOSIS — I495 Sick sinus syndrome: Secondary | ICD-10-CM

## 2021-04-09 DIAGNOSIS — I10 Essential (primary) hypertension: Secondary | ICD-10-CM

## 2021-04-09 DIAGNOSIS — I5032 Chronic diastolic (congestive) heart failure: Secondary | ICD-10-CM | POA: Diagnosis not present

## 2021-04-09 LAB — CUP PACEART INCLINIC DEVICE CHECK
Battery Impedance: 2091 Ohm
Battery Remaining Longevity: 28 mo
Battery Voltage: 2.73 V
Brady Statistic AP VP Percent: 96 %
Brady Statistic AP VS Percent: 0 %
Brady Statistic AS VP Percent: 4 %
Brady Statistic AS VS Percent: 0 %
Date Time Interrogation Session: 20221227124918
Implantable Lead Implant Date: 20111031
Implantable Lead Implant Date: 20111031
Implantable Lead Location: 753859
Implantable Lead Location: 753860
Implantable Lead Model: 4469
Implantable Lead Model: 4470
Implantable Lead Serial Number: 548557
Implantable Lead Serial Number: 686076
Implantable Pulse Generator Implant Date: 20111031
Lead Channel Impedance Value: 468 Ohm
Lead Channel Impedance Value: 499 Ohm
Lead Channel Pacing Threshold Amplitude: 0.5 V
Lead Channel Pacing Threshold Amplitude: 0.625 V
Lead Channel Pacing Threshold Amplitude: 0.75 V
Lead Channel Pacing Threshold Amplitude: 0.75 V
Lead Channel Pacing Threshold Pulse Width: 0.4 ms
Lead Channel Pacing Threshold Pulse Width: 0.4 ms
Lead Channel Pacing Threshold Pulse Width: 0.4 ms
Lead Channel Pacing Threshold Pulse Width: 0.4 ms
Lead Channel Setting Pacing Amplitude: 2 V
Lead Channel Setting Pacing Amplitude: 2.5 V
Lead Channel Setting Pacing Pulse Width: 0.4 ms
Lead Channel Setting Sensing Sensitivity: 5.6 mV

## 2021-04-09 NOTE — Progress Notes (Signed)
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  Since last being seen in our clinic, the patient reports doing reasonably well.  She is not very active.  + chronic SOB. Today, she denies symptoms of palpitations, chest pain,  lower extremity edema, dizziness, presyncope, or syncope.  The patient is otherwise without complaint today.   Past Medical History:  Diagnosis Date   Anemia, unspecified    Anxiety state, unspecified    Aortic valve disorders    Bradycardia    s/p PPM   Bursitis    Complication of anesthesia    has had difficulty awakening in past; pt states did well with the anesthesia given during her carotid surgery    Diabetes mellitus without complication ([place])    Diverticulosis of colon (without mention of hemorrhage)    Herpes zoster with other nervous system complications(053.19)    Hyperlipidemia    Hypertension    Hypothyroidism    Irritable bowel syndrome    Osteoarthrosis, unspecified whether generalized or localized, unspecified site    Peripheral vascular disease ([place])    Squamous cell carcinoma of skin 04/21/2012   left sideburn - tx p bx   Squamous cell carcinoma of skin 01/10/2013   recurrent on left sideburn - tx p bx   Squamous cell carcinoma of skin 08/22/2013   left sideburn - tx p bx   TIA (transient ischemic attack)    Unspecified hearing loss    Varicose veins    Past Surgical History:  Procedure Laterality Date   APPENDECTOMY     CATARACT EXTRACTION, BILATERAL  end of June 2013   CHOLECYSTECTOMY     DILATION AND CURETTAGE OF UTERUS     ENDARTERECTOMY Right 01/20/2014   Procedure: RIGHT CAROTID ARTERY ENDARTERECTOMY WITH DACRON PATCH ANGIOPLASTY;  Surgeon: Mal Misty, MD;  Location: [facility] OR;  Service: Vascular;  Laterality: Right;   PACEMAKER INSERTION     MDT dual chamber PPM implanted by Dr [name] for sinus node dysfunction     TONSILLECTOMY     TOTAL HIP ARTHROPLASTY Left 03/23/2015   Procedure: TOTAL HIP ARTHROPLASTY ANTERIOR APPROACH;  Surgeon: Gaynelle Arabian, MD;  Location: [facility] ORS;  Service: Orthopedics;  Laterality: Left;   VAGINAL HYSTERECTOMY      ROS- all systems are reviewed and negative except as per HPI above  Current Outpatient Medications  Medication Sig Dispense Refill   ALPHA LIPOIC ACID PO Take 1 tablet by mouth daily.     cetirizine (ZYRTEC) 10 MG tablet Take 10 mg by mouth at bedtime.     Coenzyme Q10 (CO Q 10 PO) Take 1 tablet by mouth daily.     CRANBERRY PO Take 3 tablets by mouth daily with supper.      Cyanocobalamin (VITAMIN B-12 PO) Take 1 tablet by mouth at bedtime.     Docusate Calcium (STOOL SOFTENER PO) Take 1 capsule by mouth at bedtime.      estradiol (ESTRACE) 0.1 MG/GM vaginal cream PV as directed 42.5 g 3   EVENING PRIMROSE OIL PO Take 1 capsule by mouth daily.     furosemide (LASIX) 20 MG tablet Take 20 mg by mouth daily as needed (NO MORE THAN 1 TO 2 TIMES  A WEEK).     hydrochlorothiazide (MICROZIDE) 12.5 MG capsule Take 12.5 mg by mouth daily.     ipratropium (ATROVENT) 0.06 %  nasal spray Place 2 sprays into both nostrils 3 (three) times daily. 45 mL 3   Lancets (ONETOUCH ULTRASOFT) lancets Use to check blood sugars once a day Dx E11.9 100 each 3   levothyroxine (SYNTHROID) 112 MCG tablet Take 1 tablet (112 mcg total) by mouth daily. 90 tablet 0   losartan (COZAAR) 50 MG tablet Take 1 tablet (50 mg total) by mouth daily. 90 tablet 1   metoprolol tartrate (LOPRESSOR) 25 MG tablet TAKE 1 TABLET BY MOUTH TWICE A DAY 180 tablet 3   Multiple Vitamins-Minerals (EYE VITAMINS) CAPS Take 1 capsule by mouth daily.     ONETOUCH ULTRA test strip USE TO CHECK BLOOD SUGAR TWICE DAILY 100 strip 12   polyethylene glycol (MIRALAX / GLYCOLAX) packet Take 17 g by mouth daily as needed for mild constipation.     pregabalin (LYRICA) 150 MG capsule TAKE 1 CAPSULE BY MOUTH AT BEDTIME. 30 capsule 3    simvastatin (ZOCOR) 40 MG tablet TAKE 1 TABLET DAILY 90 tablet 3   No current facility-administered medications for this visit.    Physical Exam: Vitals:   04/09/21 1210  BP: (!) 108/56  Pulse: 60  SpO2: 96%  Weight: 155 lb (70.3 kg)  Height: 5\' 5"  (1.651 m)    GEN- The patient is well appearing, alert and oriented x 3 today.   Head- normocephalic, atraumatic Eyes-  Sclera clear, conjunctiva pink Ears- hearing intact Oropharynx- clear Lungs- Clear to ausculation bilaterally, normal work of breathing Chest- pacemaker pocket is well healed Heart- Regular rate and rhythm,  3/6 SEM LUSB which is mid peaking GI- soft, NT, ND, + BS Extremities- no clubbing, cyanosis, or edema  Pacemaker interrogation- reviewed in detail today,  See PACEART report  ekg tracing ordered today is personally reviewed and shows AV paced  Assessment and Plan:  1. Symptomatic sinus bradycardia and complete heart block Normal pacemaker function See Pace Art report No changes today she is device dependant today  2. HTN Stable No change required today  3. Chronic diastolic dysfunction Stable No change required today Labs 03/05/21 reviewed  4. AS Moderate by exam and by echo 06/25/20 (reviewed today) No changes currently   Risks, benefits and potential toxicities for medications prescribed and/or refilled reviewed with patient today.   Return to see EP APP in a year  Thompson Grayer MD, [facility] 04/09/2021 12:11 PM

## 2021-04-09 NOTE — Patient Instructions (Signed)
Medication Instructions:  Your physician recommends that you continue on your current medications as directed. Please refer to the Current Medication list given to you today. *If you need a refill on your cardiac medications before your next appointment, please call your pharmacy*  Lab Work: None. If you have labs (blood work) drawn today and your tests are completely normal, you will receive your results only by: [place] (if you have MyChart) OR A paper copy in the mail If you have any lab test that is abnormal or we need to change your treatment, we will call you to review the results.  Testing/Procedures: None.  Follow-Up: At [facility], you and your health needs are our priority.  As part of our continuing mission to provide you with exceptional heart care, we have created designated Provider Care Teams.  These Care Teams include your primary Cardiologist (physician) and Advanced Practice Providers (APPs -  Physician Assistants and Nurse Practitioners) who all work together to provide you with the care you need, when you need it.  Your physician wants you to follow-up in: 12 months with one of the following Advanced Practice Providers on your designated Care Team:    Tommye Standard, PA-C    You will receive a reminder letter in the mail two months in advance. If you don't receive a letter, please call our office to schedule the follow-up appointment.  Remote monitoring is used to monitor your Pacemaker from home. This monitoring reduces the number of office visits required to check your device to one time per year. It allows [place] to keep an eye on the functioning of your device to ensure it is working properly. You are scheduled for a device check from home on 04/17/21. You may send your transmission at any time that day. If you have a wireless device, the transmission will be sent automatically. After your physician reviews your transmission, you will receive a postcard with your  next transmission date.  We recommend signing up for the patient portal called "MyChart".  Sign up information is provided on this After Visit Summary.  MyChart is used to connect with patients for Virtual Visits (Telemedicine).  Patients are able to view lab/test results, encounter notes, upcoming appointments, etc.  Non-urgent messages can be sent to your provider as well.   To learn more about what you can do with MyChart, go to [email or web address].    Any Other Special Instructions Will Be Listed Below (If Applicable).

## 2021-04-14 ENCOUNTER — Encounter: Payer: Self-pay | Admitting: Internal Medicine

## 2021-04-16 ENCOUNTER — Ambulatory Visit (INDEPENDENT_AMBULATORY_CARE_PROVIDER_SITE_OTHER): Payer: MEDICARE | Admitting: Gastroenterology

## 2021-04-16 ENCOUNTER — Encounter: Payer: Self-pay | Admitting: Gastroenterology

## 2021-04-16 VITALS — BP 112/60 | HR 56 | Ht 65.0 in | Wt 152.6 lb

## 2021-04-16 DIAGNOSIS — K921 Melena: Secondary | ICD-10-CM

## 2021-04-16 NOTE — Progress Notes (Signed)
HPI : Kayla Davis is a very pleasant 88 year old female with a history of bradycardia status post pacemaker who is referred to [place] by Dr. Scarlette Calico for further evaluation of painless hematochezia.  Patient states that she started noticing what appeared to be bright red blood following a bowel movement in November.  She states that she would see red stains in her underwear as well as seeing reddish tent in the toilet water.  She had her son look at it several times and the son agreed that it did appear to look like bright red blood.  She was evaluated by Dr. Ronnald Ramp and a rectal exam was performed which was unremarkable for hemorrhoids or rectal mass.  A bedside guaiac test was negative.  She had also been having some abdominal discomfort, so a CT scan of the abdomen pelvis was performed, and was unremarkable.  A CBC showed that her hemoglobin is at her baseline compared to last November, and an iron panel was normal.  States that she had been drinking beet juice on a daily basis around the time that she started having the red stools.  She stopped drinking the beet juice, and stopped seeing the red in her stools.  She has not seen any blood in several weeks, and she stopped drinking beet juice a few weeks before Christmas.  She has a chronic history of irregular bowel habits, alternating between hard, small stools and diarrhea.  She takes Miralax most days to help with the hard stools.  Her bowel habits did not change significantly during this time.  She takes Metamucil capsules on occasion. She thinks her last colonoscopy was >20 years ago.  She thinks she did have polyps but is not certain.  Past Medical History:  Diagnosis Date   Anemia, unspecified    Anxiety state, unspecified    Aortic valve disorders    Bradycardia    s/p PPM   Bursitis    Complication of anesthesia    has had difficulty awakening in past; pt states did well with the anesthesia given during her carotid surgery    Diabetes  mellitus without complication ([place])    Diverticulosis of colon (without mention of hemorrhage)    Herpes zoster with other nervous system complications(053.19)    Hyperlipidemia    Hypertension    Hypothyroidism    Irritable bowel syndrome    Osteoarthrosis, unspecified whether generalized or localized, unspecified site    Peripheral vascular disease ([place])    Squamous cell carcinoma of skin 04/21/2012   left sideburn - tx p bx   Squamous cell carcinoma of skin 01/10/2013   recurrent on left sideburn - tx p bx   Squamous cell carcinoma of skin 08/22/2013   left sideburn - tx p bx   TIA (transient ischemic attack)    Unspecified hearing loss    Varicose veins      Past Surgical History:  Procedure Laterality Date   APPENDECTOMY     CATARACT EXTRACTION, BILATERAL  end of June 2013   CHOLECYSTECTOMY     DILATION AND CURETTAGE OF UTERUS     ENDARTERECTOMY Right 01/20/2014   Procedure: RIGHT CAROTID ARTERY ENDARTERECTOMY WITH DACRON PATCH ANGIOPLASTY;  Surgeon: Mal Misty, MD;  Location: [facility] OR;  Service: Vascular;  Laterality: Right;   PACEMAKER INSERTION     MDT dual chamber PPM implanted by Dr [name] for sinus node dysfunction    TONSILLECTOMY     TOTAL HIP ARTHROPLASTY Left 03/23/2015  Procedure: TOTAL HIP ARTHROPLASTY ANTERIOR APPROACH;  Surgeon: Gaynelle Arabian, MD;  Location: [facility];  Service: Orthopedics;  Laterality: Left;   VAGINAL HYSTERECTOMY     Family History  Problem Relation Age of Onset   Heart disease Mother    Heart disease Father    Diabetes Father    Cancer Paternal Grandmother    Social History   Tobacco Use   Smoking status: Never   Smokeless tobacco: Never  Vaping Use   Vaping Use: Never used  Substance Use Topics   Alcohol use: No    Alcohol/week: 0.0 standard drinks   Drug use: No   Current Outpatient Medications  Medication Sig Dispense Refill   ALPHA LIPOIC ACID PO Take 1 tablet by mouth daily.     cetirizine (ZYRTEC) 10 MG tablet Take  10 mg by mouth at bedtime.     Coenzyme Q10 (CO Q 10 PO) Take 1 tablet by mouth daily.     CRANBERRY PO Take 3 tablets by mouth daily with supper.      Cyanocobalamin (VITAMIN B-12 PO) Take 1 tablet by mouth at bedtime.     Docusate Calcium (STOOL SOFTENER PO) Take 1 capsule by mouth at bedtime.      estradiol (ESTRACE) 0.1 MG/GM vaginal cream PV as directed 42.5 g 3   EVENING PRIMROSE OIL PO Take 1 capsule by mouth daily.     furosemide (LASIX) 20 MG tablet Take 20 mg by mouth daily as needed (NO MORE THAN 1 TO 2 TIMES  A WEEK).     hydrochlorothiazide (MICROZIDE) 12.5 MG capsule Take 12.5 mg by mouth daily.     ipratropium (ATROVENT) 0.06 % nasal spray Place 2 sprays into both nostrils 3 (three) times daily. 45 mL 3   Lancets (ONETOUCH ULTRASOFT) lancets Use to check blood sugars once a day Dx E11.9 100 each 3   levothyroxine (SYNTHROID) 112 MCG tablet Take 1 tablet (112 mcg total) by mouth daily. 90 tablet 0   losartan (COZAAR) 50 MG tablet Take 1 tablet (50 mg total) by mouth daily. 90 tablet 1   metoprolol tartrate (LOPRESSOR) 25 MG tablet TAKE 1 TABLET BY MOUTH TWICE A DAY 180 tablet 3   Multiple Vitamins-Minerals (EYE VITAMINS) CAPS Take 1 capsule by mouth daily.     ONETOUCH ULTRA test strip USE TO CHECK BLOOD SUGAR TWICE DAILY 100 strip 12   polyethylene glycol (MIRALAX / GLYCOLAX) packet Take 17 g by mouth daily as needed for mild constipation.     pregabalin (LYRICA) 150 MG capsule TAKE 1 CAPSULE BY MOUTH AT BEDTIME. 30 capsule 3   simvastatin (ZOCOR) 40 MG tablet TAKE 1 TABLET DAILY 90 tablet 3   No current facility-administered medications for this visit.   Allergies  Allergen Reactions   Penicillins Swelling and Other (See Comments)    BODY SWELLS & PT STATES THAT SHE ALMOST DIED Has patient had a PCN reaction causing immediate rash, facial/tongue/throat swelling, SOB or lightheadedness with hypotension: Yes Has patient had a PCN reaction causing severe rash involving mucus  membranes or skin necrosis: known Has patient had a PCN reaction that required hospitalization Yes Has patient had a PCN reaction occurring within the last 10 years: No If all of the above answers are "NO", then may proceed with Cephalosporin u   Alprazolam Swelling    EYES SWELL   Bactrim [Sulfamethoxazole-Trimethoprim]     Unable to remember the side effects   Ceclor [Cefaclor]     Unsure  of the side effects   Cephalexin     Unsure of the side effects   Ciprofloxacin Itching and Swelling    Pt states that she tolerated po cipro well, but not the higher dose given IV   Clindamycin/Lincomycin Nausea Only    Upset stomach   Hydrocodone     Unsure of reaction to med   Noroxin [Norfloxacin]     Unsure of side effects   Sulfonamide Derivatives Itching   Tetracyclines & Related     Unsure of the reaction to this medication   Tramadol Other (See Comments)    Did not like how she "felt"     Review of Systems: All systems reviewed and negative except where noted in HPI.    CT Abdomen Pelvis W Contrast  Result Date: 03/18/2021 CLINICAL DATA:  Periumbilical pain.  Blood in stool. EXAM: CT ABDOMEN AND PELVIS WITH CONTRAST TECHNIQUE: Multidetector CT imaging of the abdomen and pelvis was performed using the standard protocol following bolus administration of intravenous contrast. CONTRAST:  151mL ISOVUE-300 IOPAMIDOL (ISOVUE-300) INJECTION 61% COMPARISON:  10/22/2019 FINDINGS: Lower chest: Cardiomegaly. Diffuse coronary artery calcifications. Descending aortic atherosclerosis. No aneurysm. No effusions. Hepatobiliary: No focal liver abnormality is seen. Status post cholecystectomy. No biliary dilatation. Pancreas: No focal abnormality or ductal dilatation. Spleen: No focal abnormality.  Normal size. Adrenals/Urinary Tract: Right extrarenal pelvis. No renal or adrenal mass. No stones or hydronephrosis. Urinary bladder grossly unremarkable. Stomach/Bowel: Colonic diverticulosis, most pronounced  in the sigmoid colon. No active diverticulitis. Moderate stool burden. Stomach and small bowel unremarkable. No bowel obstruction. Vascular/Lymphatic: Aortoiliac atherosclerosis. No aneurysm or adenopathy. Reproductive: Prior hysterectomy.  No adnexal masses. Other: No free fluid or free air. Small umbilical hernia containing fat, stable since prior study. Musculoskeletal: Diffuse advanced degenerative disc disease and facet disease. Bilateral L5 pars defects with grade 1 anterolisthesis. Findings stable. Mild compression fracture at L1, stable. IMPRESSION: Colonic diverticulosis, most pronounced in the sigmoid colon. No active diverticulitis. Moderate stool burden. Aortoiliac atherosclerosis.  Coronary artery disease. No acute findings. Electronically Signed   By: Rolm Baptise M.D.   On: 03/18/2021 13:19   CUP PACEART INCLINIC DEVICE CHECK  Result Date: 04/09/2021 Pacemaker check in clinic. Normal device function. Thresholds, sensing, impedances consistent with previous measurements. Device programmed to maximize longevity. <0.1% AT/AF . 3 VHR longest episode 26seconds in duration with max V rate of 154bpm likely non-sustained VT. Device programmed at appropriate safety margins. Histogram distribution appropriate for patient activity level. Device programmed to optimize intrinsic conduction. Estimated longevity 28 months. [facility] 1/4/2023Elizabeth Isaiah Serge   Physical Exam: BP 112/60    Pulse (!) 56    Ht 5\' 5"  (1.651 m)    Wt 152 lb 9.6 oz (69.2 kg)    LMP  (LMP Unknown)    BMI 25.39 kg/m  Constitutional: Pleasant,well-developed, elderly Caucasian female in no acute distress.  Accompanied by son HEENT: Normocephalic and atraumatic. Conjunctivae are normal. No scleral icterus. Cardiovascular: Normal rate, regular rhythm.   2/6 systolic murmur Pulmonary/chest: Effort normal and breath sounds normal. No wheezing, rales or rhonchi. Abdominal: Soft, nondistended, nontender. Bowel sounds active  throughout. There are no masses palpable. No hepatomegaly. Extremities: no edema Neurological: Alert and oriented to person place and time. Skin: Skin is warm and dry. No rashes noted. Psychiatric: Normal mood and affect. Behavior is normal.  CBC    Component Value Date/Time   WBC 8.1 03/05/2021 1515   RBC 3.78 (L) 03/05/2021 1515   HGB 11.9 (L)  03/05/2021 1515   HCT 36.2 03/05/2021 1515   PLT 174.0 03/05/2021 1515   MCV 95.8 03/05/2021 1515   MCH 31.0 10/22/2019 1645   MCHC 32.9 03/05/2021 1515   RDW 13.5 03/05/2021 1515   LYMPHSABS 2.3 03/05/2021 1515   MONOABS 0.8 03/05/2021 1515   EOSABS 0.9 (H) 03/05/2021 1515   BASOSABS 0.1 03/05/2021 1515    CMP     Component Value Date/Time   NA 136 03/05/2021 1515   NA 137 06/01/2020 1636   K 4.4 03/05/2021 1515   CL 98 03/05/2021 1515   CO2 31 03/05/2021 1515   GLUCOSE 74 03/05/2021 1515   BUN 21 03/05/2021 1515   BUN 25 06/01/2020 1636   CREATININE 1.07 03/05/2021 1515   CALCIUM 9.4 03/05/2021 1515   PROT 6.6 03/05/2021 1515   ALBUMIN 4.2 03/05/2021 1515   AST 22 03/05/2021 1515   ALT 15 03/05/2021 1515   ALKPHOS 66 03/05/2021 1515   BILITOT 0.5 03/05/2021 1515   GFRNONAA 53 (L) 06/01/2020 1636   GFRAA 61 06/01/2020 1636     ASSESSMENT AND PLAN: 88 year old female with history of bradycardia s/p PPM, with several weeks of frequent bright red blood per rectum, with normal CBC, iron panel, CT abd/pelvis, and negative bedside guaiac.  Although a colonoscopy would certainly be reasonable in this scenario, given the patient's advanced age and frailty, I think the threshold for pursuing a colonoscopy is higher.  Transient bright red blood per rectum is most commonly secondary to internal hemorrhoids, and in the setting of a normal CBC, iron panel and CT scan, I think the likelihood of a colorectal malignancy is low.  In addition, the patient offered that she didn't think she would even want to pursue any treatment for cancer, to  include surgery or chemotherapy.  It is also possible that the beet juice she was drinking was causing the discoloration of her stool, and so I advised her to continue to avoid drinking beet juice for now.  I recommended we repeat a CBC in 1 month and then reconsider the risks and benefits of a diagnostic colonoscopy at that time if she has become anemic and is still having hematochezia.  In the meantime, I recommended she start taking Metamucil everyday and consider increasing to 2-3x/day if not having improved stool consistency.   Questionable hematochezia - CBC in 1 month - Daily metamucil - Reconsider colonoscopy in 1 month if hematochezia not resolved and if anemic  Claretha Townshend E. Candis Schatz, MD [facility]   CC:  Plotnikov, Evie Lacks, MD

## 2021-04-16 NOTE — Patient Instructions (Signed)
If you are age 86 or older, your body mass index should be between 23-30. Your Body mass index is 25.39 kg/m. If this is out of the aforementioned range listed, please consider follow up with your Primary Care Provider.  If you are age 68 or younger, your body mass index should be between 19-25. Your Body mass index is 25.39 kg/m. If this is out of the aformentioned range listed, please consider follow up with your Primary Care Provider.   Your provider has requested that you go to the basement level for lab work in a month. Press "B" on the elevator. The lab is located at the first door on the left as you exit the elevator.  Please take metamucil with every meal.  The  GI providers would like to encourage you to use [facility] to communicate with providers for non-urgent requests or questions.  Due to long hold times on the telephone, sending your provider a message by [facility] may be a faster and more efficient way to get a response.  Please allow 48 business hours for a response.  Please remember that this is for non-urgent requests.   It was a pleasure to see you today!  Thank you for trusting me with your gastrointestinal care!    Scott E.Candis Schatz, MD

## 2021-04-17 ENCOUNTER — Encounter: Payer: Self-pay | Admitting: Gastroenterology

## 2021-04-17 ENCOUNTER — Other Ambulatory Visit: Payer: Self-pay | Admitting: Internal Medicine

## 2021-04-17 ENCOUNTER — Ambulatory Visit (INDEPENDENT_AMBULATORY_CARE_PROVIDER_SITE_OTHER): Payer: MEDICARE

## 2021-04-17 DIAGNOSIS — I495 Sick sinus syndrome: Secondary | ICD-10-CM

## 2021-04-17 MED ORDER — ESTRADIOL 0.1 MG/GM VA CREA: TOPICAL_CREAM | VAGINAL | 3 refills | Status: AC

## 2021-04-18 LAB — CUP PACEART REMOTE DEVICE CHECK
Battery Impedance: 2047 Ohm
Battery Remaining Longevity: 30 mo
Battery Voltage: 2.73 V
Brady Statistic AP VP Percent: 98 %
Brady Statistic AP VS Percent: 0 %
Brady Statistic AS VP Percent: 2 %
Brady Statistic AS VS Percent: 0 %
Date Time Interrogation Session: 20230104191527
Implantable Lead Implant Date: 20111031
Implantable Lead Implant Date: 20111031
Implantable Lead Location: 753859
Implantable Lead Location: 753860
Implantable Lead Model: 4469
Implantable Lead Model: 4470
Implantable Lead Serial Number: 548557
Implantable Lead Serial Number: 686076
Implantable Pulse Generator Implant Date: 20111031
Lead Channel Impedance Value: 490 Ohm
Lead Channel Impedance Value: 519 Ohm
Lead Channel Pacing Threshold Amplitude: 0.75 V
Lead Channel Pacing Threshold Amplitude: 0.75 V
Lead Channel Pacing Threshold Pulse Width: 0.4 ms
Lead Channel Pacing Threshold Pulse Width: 0.4 ms
Lead Channel Setting Pacing Amplitude: 2 V
Lead Channel Setting Pacing Amplitude: 2.5 V
Lead Channel Setting Pacing Pulse Width: 0.4 ms
Lead Channel Setting Sensing Sensitivity: 5.6 mV

## 2021-04-19 ENCOUNTER — Other Ambulatory Visit: Payer: Self-pay | Admitting: *Deleted

## 2021-04-19 MED ORDER — IPRATROPIUM BROMIDE 0.06 % NA SOLN: 2.0000 | Freq: Three times a day (TID) | NASAL | 3 refills | Status: AC

## 2021-04-29 NOTE — Progress Notes (Signed)
Remote pacemaker transmission.   

## 2021-05-20 ENCOUNTER — Other Ambulatory Visit (INDEPENDENT_AMBULATORY_CARE_PROVIDER_SITE_OTHER): Payer: MEDICARE

## 2021-05-20 DIAGNOSIS — K921 Melena: Secondary | ICD-10-CM

## 2021-05-20 LAB — CBC WITH DIFFERENTIAL/PLATELET
Basophils Absolute: 0.1 10*3/uL (ref 0.0–0.1)
Basophils Relative: 1.4 % (ref 0.0–3.0)
Eosinophils Absolute: 0.4 10*3/uL (ref 0.0–0.7)
Eosinophils Relative: 6.8 % — ABNORMAL HIGH (ref 0.0–5.0)
HCT: 35.3 % — ABNORMAL LOW (ref 36.0–46.0)
Hemoglobin: 11.8 g/dL — ABNORMAL LOW (ref 12.0–15.0)
Lymphocytes Relative: 40.2 % (ref 12.0–46.0)
Lymphs Abs: 2.4 10*3/uL (ref 0.7–4.0)
MCHC: 33.3 g/dL (ref 30.0–36.0)
MCV: 94.2 fl (ref 78.0–100.0)
Monocytes Absolute: 0.5 10*3/uL (ref 0.1–1.0)
Monocytes Relative: 9.2 % (ref 3.0–12.0)
Neutro Abs: 2.5 10*3/uL (ref 1.4–7.7)
Neutrophils Relative %: 42.4 % — ABNORMAL LOW (ref 43.0–77.0)
Platelets: 196 10*3/uL (ref 150.0–400.0)
RBC: 3.74 Mil/uL — ABNORMAL LOW (ref 3.87–5.11)
RDW: 13.2 % (ref 11.5–15.5)
WBC: 5.9 10*3/uL (ref 4.0–10.5)

## 2021-05-21 ENCOUNTER — Encounter: Payer: Self-pay | Admitting: Internal Medicine

## 2021-05-22 ENCOUNTER — Other Ambulatory Visit: Payer: Self-pay | Admitting: Internal Medicine

## 2021-05-22 DIAGNOSIS — E034 Atrophy of thyroid (acquired): Secondary | ICD-10-CM

## 2021-05-23 ENCOUNTER — Other Ambulatory Visit: Payer: Self-pay | Admitting: Internal Medicine

## 2021-05-23 DIAGNOSIS — M79675 Pain in left toe(s): Secondary | ICD-10-CM

## 2021-05-23 DIAGNOSIS — M79674 Pain in right toe(s): Secondary | ICD-10-CM

## 2021-05-23 NOTE — Progress Notes (Signed)
Kayla Davis,  Good news, your hemoglobin was stable.  I feel confident that the red stools you were having was not from colon cancer.  Have you continued to have red stools since you stopped drinking the beet juice?

## 2021-05-30 ENCOUNTER — Encounter: Payer: Self-pay | Admitting: Internal Medicine

## 2021-05-30 DIAGNOSIS — E034 Atrophy of thyroid (acquired): Secondary | ICD-10-CM

## 2021-05-30 MED ORDER — LEVOTHYROXINE SODIUM 112 MCG PO TABS: 112.0000 ug | ORAL_TABLET | Freq: Every day | ORAL | 0 refills | Status: AC

## 2021-06-05 ENCOUNTER — Ambulatory Visit (INDEPENDENT_AMBULATORY_CARE_PROVIDER_SITE_OTHER): Payer: MEDICARE | Admitting: Podiatry

## 2021-06-05 ENCOUNTER — Other Ambulatory Visit: Payer: Self-pay

## 2021-06-05 ENCOUNTER — Encounter: Payer: Self-pay | Admitting: Podiatry

## 2021-06-05 DIAGNOSIS — M79675 Pain in left toe(s): Secondary | ICD-10-CM

## 2021-06-05 DIAGNOSIS — M79674 Pain in right toe(s): Secondary | ICD-10-CM

## 2021-06-05 DIAGNOSIS — E118 Type 2 diabetes mellitus with unspecified complications: Secondary | ICD-10-CM | POA: Diagnosis not present

## 2021-06-05 DIAGNOSIS — B351 Tinea unguium: Secondary | ICD-10-CM

## 2021-06-05 NOTE — Progress Notes (Signed)
°  Subjective:  Patient ID: Kayla Davis, female    DOB: [birth year],   MRN: [id number]  Chief Complaint  Patient presents with   Nail Problem     -painful toenails,possible nail trim;neuropathy    88 y.o. female presents for concern of thickened elongated and painful nails that are difficult to trim. Requesting to have them trimmed today. Relates burning or tingling in her feet. Left worse than right. Is currently on Lyrica. Patient is diabetic and last A1c was 6.6  . Denies any other pedal complaints. Denies n/v/f/c.   PCP: Lew Dawes MD   Past Medical History:  Diagnosis Date   Anemia, unspecified    Anxiety state, unspecified    Aortic valve disorders    Bradycardia    s/p PPM   Bursitis    Complication of anesthesia    has had difficulty awakening in past; pt states did well with the anesthesia given during her carotid surgery    Diabetes mellitus without complication ([place])    Diverticulosis of colon (without mention of hemorrhage)    Herpes zoster with other nervous system complications(053.19)    Hyperlipidemia    Hypertension    Hypothyroidism    Irritable bowel syndrome    Osteoarthrosis, unspecified whether generalized or localized, unspecified site    Peripheral vascular disease ([place])    Squamous cell carcinoma of skin 04/21/2012   left sideburn - tx p bx   Squamous cell carcinoma of skin 01/10/2013   recurrent on left sideburn - tx p bx   Squamous cell carcinoma of skin 08/22/2013   left sideburn - tx p bx   TIA (transient ischemic attack)    Unspecified hearing loss    Varicose veins     Objective:  Physical Exam: Vascular: DP/PT pulses 1/4 bilateral. CFT <3 seconds. Absent hair growth on digits. Edema noted to bilateral lower extremities. Xerosis noted bilaterally.  Skin. No lacerations or abrasions bilateral feet. Nails 1-5 bilateral  are thickened discolored and elongated with subungual debris.  Musculoskeletal: MMT 5/5 bilateral lower  extremities in DF, PF, Inversion and Eversion. Deceased ROM in DF of ankle joint.  Neurological: Sensation intact to light touch. Protective sensation diminished bilateral.  .   Assessment:   1. Type II diabetes mellitus with manifestations (HCC)   2. Pain due to onychomycosis of toenails of both feet      Plan:  Patient was evaluated and treated and all questions answered. -Discussed and educated patient on diabetic foot care, especially with  regards to the vascular, neurological and musculoskeletal systems.  -Stressed the importance of good glycemic control and the detriment of not  controlling glucose levels in relation to the foot. -Discussed supportive shoes at all times and checking feet regularly.  -Mechanically debrided all nails 1-5 bilateral using sterile nail nipper and filed with dremel without incident  -Answered all patient questions -Decreased pulses so will send for ABIs to evaluate  -Discussed neruopathy and continue regimen by PCP.  -Patient to return  in 3 months for at risk foot care -Patient advised to call the office if any problems or questions arise in the meantime.   Lorenda Peck, DPM

## 2021-06-06 DIAGNOSIS — U071 COVID-19: Secondary | ICD-10-CM | POA: Diagnosis not present

## 2021-06-10 ENCOUNTER — Ambulatory Visit (HOSPITAL_COMMUNITY)
Admission: RE | Admit: 2021-06-10 | Discharge: 2021-06-10 | Disposition: A | Discharge status: Home / Self Care | Payer: MEDICARE | Source: Ambulatory Visit | Attending: Podiatry | Admitting: Podiatry

## 2021-06-10 ENCOUNTER — Other Ambulatory Visit: Payer: Self-pay

## 2021-06-10 DIAGNOSIS — E118 Type 2 diabetes mellitus with unspecified complications: Secondary | ICD-10-CM | POA: Diagnosis not present

## 2021-06-10 DIAGNOSIS — M79674 Pain in right toe(s): Secondary | ICD-10-CM | POA: Diagnosis not present

## 2021-06-10 DIAGNOSIS — B351 Tinea unguium: Secondary | ICD-10-CM | POA: Insufficient documentation

## 2021-06-10 DIAGNOSIS — M79675 Pain in left toe(s): Secondary | ICD-10-CM | POA: Diagnosis not present

## 2021-06-11 ENCOUNTER — Other Ambulatory Visit: Payer: Self-pay | Admitting: Podiatry

## 2021-06-11 DIAGNOSIS — E118 Type 2 diabetes mellitus with unspecified complications: Secondary | ICD-10-CM

## 2021-06-13 DIAGNOSIS — I469 Cardiac arrest, cause unspecified: Secondary | ICD-10-CM | POA: Diagnosis not present

## 2021-06-13 DIAGNOSIS — R404 Transient alteration of awareness: Secondary | ICD-10-CM | POA: Diagnosis not present

## 2021-06-13 DIAGNOSIS — I499 Cardiac arrhythmia, unspecified: Secondary | ICD-10-CM | POA: Diagnosis not present

## 2021-06-13 DIAGNOSIS — R4020 Unspecified coma: Secondary | ICD-10-CM | POA: Diagnosis not present

## 2021-07-06 ENCOUNTER — Other Ambulatory Visit: Payer: Self-pay | Admitting: Internal Medicine

## 2021-07-13 DIAGNOSIS — 419620001 Death: Secondary | SNOMED CT | POA: Diagnosis not present

## 2021-07-13 DEATH — deceased

## 2021-09-04 ENCOUNTER — Ambulatory Visit: Payer: MEDICARE | Admitting: Podiatry
